# Patient Record
Sex: Male | Born: 1950 | Race: White | Hispanic: No | Smoking: Never smoker
Health system: Southern US, Community
[De-identification: ages and names within clinical notes are randomized; demographics above are authoritative.]

## PROBLEM LIST (undated history)

## (undated) DIAGNOSIS — I509 Heart failure, unspecified: Secondary | ICD-10-CM

## (undated) DIAGNOSIS — G5612 Other lesions of median nerve, left upper limb: Secondary | ICD-10-CM

## (undated) DIAGNOSIS — G8929 Other chronic pain: Secondary | ICD-10-CM

## (undated) DIAGNOSIS — N289 Disorder of kidney and ureter, unspecified: Secondary | ICD-10-CM

## (undated) DIAGNOSIS — K5792 Diverticulitis of intestine, part unspecified, without perforation or abscess without bleeding: Secondary | ICD-10-CM

## (undated) DIAGNOSIS — G5620 Lesion of ulnar nerve, unspecified upper limb: Secondary | ICD-10-CM

## (undated) DIAGNOSIS — M48062 Spinal stenosis, lumbar region with neurogenic claudication: Secondary | ICD-10-CM

## (undated) DIAGNOSIS — S329XXA Fracture of unspecified parts of lumbosacral spine and pelvis, initial encounter for closed fracture: Secondary | ICD-10-CM

## (undated) DIAGNOSIS — I10 Essential (primary) hypertension: Secondary | ICD-10-CM

## (undated) DIAGNOSIS — S82892A Other fracture of left lower leg, initial encounter for closed fracture: Secondary | ICD-10-CM

## (undated) DIAGNOSIS — K922 Gastrointestinal hemorrhage, unspecified: Secondary | ICD-10-CM

## (undated) DIAGNOSIS — N183 Chronic kidney disease, stage 3 unspecified: Secondary | ICD-10-CM

## (undated) DIAGNOSIS — M4804 Spinal stenosis, thoracic region: Secondary | ICD-10-CM

## (undated) DIAGNOSIS — U071 COVID-19: Secondary | ICD-10-CM

## (undated) DIAGNOSIS — M549 Dorsalgia, unspecified: Secondary | ICD-10-CM

## (undated) DIAGNOSIS — J329 Chronic sinusitis, unspecified: Secondary | ICD-10-CM

## (undated) DIAGNOSIS — G459 Transient cerebral ischemic attack, unspecified: Secondary | ICD-10-CM

## (undated) DIAGNOSIS — M1990 Unspecified osteoarthritis, unspecified site: Secondary | ICD-10-CM

## (undated) DIAGNOSIS — R9431 Abnormal electrocardiogram [ECG] [EKG]: Secondary | ICD-10-CM

## (undated) DIAGNOSIS — R911 Solitary pulmonary nodule: Secondary | ICD-10-CM

## (undated) DIAGNOSIS — Z9981 Dependence on supplemental oxygen: Secondary | ICD-10-CM

## (undated) DIAGNOSIS — F32A Depression, unspecified: Secondary | ICD-10-CM

## (undated) DIAGNOSIS — F329 Major depressive disorder, single episode, unspecified: Secondary | ICD-10-CM

## (undated) DIAGNOSIS — G5603 Carpal tunnel syndrome, bilateral upper limbs: Secondary | ICD-10-CM

## (undated) DIAGNOSIS — F4310 Post-traumatic stress disorder, unspecified: Secondary | ICD-10-CM

## (undated) DIAGNOSIS — G4733 Obstructive sleep apnea (adult) (pediatric): Secondary | ICD-10-CM

## (undated) HISTORY — PX: NASAL SEPTUM SURGERY: SHX37

## (undated) HISTORY — PX: ELBOW FRACTURE SURGERY: SHX616

---
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
## 2001-01-21 DIAGNOSIS — W19XXXA Unspecified fall, initial encounter: Secondary | ICD-10-CM

## 2001-01-21 HISTORY — DX: Unspecified fall, initial encounter: W19.XXXA

## 2003-02-07 ENCOUNTER — Emergency Department (HOSPITAL_COMMUNITY): Admission: EM | Admit: 2003-02-07 | Discharge: 2003-02-07 | Payer: Self-pay | Admitting: Emergency Medicine

## 2004-04-25 ENCOUNTER — Emergency Department (HOSPITAL_COMMUNITY): Admission: EM | Admit: 2004-04-25 | Discharge: 2004-04-25 | Payer: Self-pay | Admitting: Emergency Medicine

## 2005-09-11 ENCOUNTER — Emergency Department (HOSPITAL_COMMUNITY): Admission: EM | Admit: 2005-09-11 | Discharge: 2005-09-11 | Payer: Self-pay | Admitting: Emergency Medicine

## 2007-01-09 ENCOUNTER — Emergency Department (HOSPITAL_COMMUNITY): Admission: EM | Admit: 2007-01-09 | Discharge: 2007-01-09 | Payer: Self-pay | Admitting: Emergency Medicine

## 2012-01-22 DIAGNOSIS — G459 Transient cerebral ischemic attack, unspecified: Secondary | ICD-10-CM

## 2012-01-22 HISTORY — DX: Transient cerebral ischemic attack, unspecified: G45.9

## 2012-07-08 ENCOUNTER — Emergency Department (HOSPITAL_COMMUNITY): Payer: BC Managed Care – PPO

## 2012-07-08 ENCOUNTER — Observation Stay (HOSPITAL_COMMUNITY): Payer: BC Managed Care – PPO

## 2012-07-08 ENCOUNTER — Encounter (HOSPITAL_COMMUNITY): Payer: Self-pay | Admitting: Family Medicine

## 2012-07-08 ENCOUNTER — Observation Stay (HOSPITAL_COMMUNITY)
Admission: EM | Admit: 2012-07-08 | Discharge: 2012-07-11 | Disposition: A | Discharge status: Home / Self Care | Payer: BC Managed Care – PPO | Attending: Internal Medicine | Admitting: Internal Medicine

## 2012-07-08 DIAGNOSIS — R4182 Altered mental status, unspecified: Secondary | ICD-10-CM | POA: Insufficient documentation

## 2012-07-08 DIAGNOSIS — R209 Unspecified disturbances of skin sensation: Secondary | ICD-10-CM | POA: Insufficient documentation

## 2012-07-08 DIAGNOSIS — G459 Transient cerebral ischemic attack, unspecified: Principal | ICD-10-CM | POA: Diagnosis present

## 2012-07-08 DIAGNOSIS — K5732 Diverticulitis of large intestine without perforation or abscess without bleeding: Secondary | ICD-10-CM | POA: Insufficient documentation

## 2012-07-08 DIAGNOSIS — I10 Essential (primary) hypertension: Secondary | ICD-10-CM | POA: Diagnosis present

## 2012-07-08 DIAGNOSIS — R4789 Other speech disturbances: Secondary | ICD-10-CM | POA: Insufficient documentation

## 2012-07-08 DIAGNOSIS — R5383 Other fatigue: Secondary | ICD-10-CM | POA: Insufficient documentation

## 2012-07-08 DIAGNOSIS — K5792 Diverticulitis of intestine, part unspecified, without perforation or abscess without bleeding: Secondary | ICD-10-CM | POA: Diagnosis present

## 2012-07-08 DIAGNOSIS — R5381 Other malaise: Secondary | ICD-10-CM | POA: Insufficient documentation

## 2012-07-08 DIAGNOSIS — R1032 Left lower quadrant pain: Secondary | ICD-10-CM | POA: Insufficient documentation

## 2012-07-08 HISTORY — DX: Essential (primary) hypertension: I10

## 2012-07-08 LAB — BASIC METABOLIC PANEL
CO2: 25 mEq/L (ref 19–32)
Calcium: 9 mg/dL (ref 8.4–10.5)
GFR calc Af Amer: >90 mL/min (ref >90)
GFR calc non Af Amer: 89 mL/min — ABNORMAL LOW (ref >90)
Sodium: 136 mEq/L (ref 135–145)

## 2012-07-08 LAB — POCT I-STAT, CHEM 8
BUN: 26 mg/dL — ABNORMAL HIGH (ref 6–23)
Calcium, Ion: 1.06 mmol/L — ABNORMAL LOW (ref 1.13–1.30)
Chloride: 100 mEq/L (ref 96–112)
Creatinine, Ser: 1.1 mg/dL (ref 0.50–1.35)
Glucose, Bld: 97 mg/dL (ref 70–99)
Potassium: 3.2 mEq/L — ABNORMAL LOW (ref 3.5–5.1)
Sodium: 139 mEq/L (ref 135–145)

## 2012-07-08 LAB — CBC
MCH: 29 pg (ref 26.0–34.0)
MCHC: 33.9 g/dL (ref 30.0–36.0)
Platelets: 243 10*3/uL (ref 150–400)
RBC: 4.49 MIL/uL (ref 4.22–5.81)
RDW: 13.5 % (ref 11.5–15.5)

## 2012-07-08 LAB — GLUCOSE, CAPILLARY: Glucose-Capillary: 94 mg/dL (ref 70–99)

## 2012-07-08 MED ORDER — CIPROFLOXACIN HCL 500 MG PO TABS
500.0000 mg | ORAL_TABLET | Freq: Once | ORAL | Status: AC
  Administered 2012-07-08: 500 mg via ORAL
  Filled 2012-07-08: qty 1

## 2012-07-08 MED ORDER — ENOXAPARIN SODIUM 40 MG/0.4ML ~~LOC~~ SOLN
40.0000 mg | SUBCUTANEOUS | Status: DC
  Administered 2012-07-08 – 2012-07-10 (×3): 40 mg via SUBCUTANEOUS
  Filled 2012-07-08 (×4): qty 0.4

## 2012-07-08 MED ORDER — SODIUM CHLORIDE 0.9 % IV SOLN
INTRAVENOUS | Status: DC
  Administered 2012-07-08: 22:00:00 via INTRAVENOUS
  Administered 2012-07-09: 1000 mL via INTRAVENOUS

## 2012-07-08 MED ORDER — CIPROFLOXACIN IN D5W 400 MG/200ML IV SOLN
400.0000 mg | Freq: Two times a day (BID) | INTRAVENOUS | Status: DC
  Administered 2012-07-09 – 2012-07-10 (×3): 400 mg via INTRAVENOUS
  Filled 2012-07-08 (×4): qty 200

## 2012-07-08 MED ORDER — METRONIDAZOLE IN NACL 5-0.79 MG/ML-% IV SOLN
500.0000 mg | Freq: Three times a day (TID) | INTRAVENOUS | Status: DC
  Administered 2012-07-09 – 2012-07-10 (×5): 500 mg via INTRAVENOUS
  Filled 2012-07-08 (×7): qty 100

## 2012-07-08 MED ORDER — METRONIDAZOLE 500 MG PO TABS
500.0000 mg | ORAL_TABLET | Freq: Once | ORAL | Status: AC
  Administered 2012-07-08: 500 mg via ORAL
  Filled 2012-07-08: qty 1

## 2012-07-08 MED ORDER — IOHEXOL 300 MG/ML  SOLN
25.0000 mL | INTRAMUSCULAR | Status: AC
  Administered 2012-07-08: 25 mL via ORAL

## 2012-07-08 MED ORDER — ONDANSETRON HCL 4 MG/2ML IJ SOLN: 4.0000 mg | Freq: Four times a day (QID) | INTRAMUSCULAR | Status: DC | PRN

## 2012-07-08 MED ORDER — SERTRALINE HCL 100 MG PO TABS
100.0000 mg | ORAL_TABLET | Freq: Every day | ORAL | Status: DC
  Administered 2012-07-09 – 2012-07-11 (×3): 100 mg via ORAL
  Filled 2012-07-08 (×4): qty 1

## 2012-07-08 MED ORDER — ACETAMINOPHEN 325 MG PO TABS: 650.0000 mg | ORAL_TABLET | ORAL | Status: DC | PRN

## 2012-07-08 MED ORDER — ASPIRIN 325 MG PO TABS
325.0000 mg | ORAL_TABLET | Freq: Every day | ORAL | Status: DC
  Administered 2012-07-09 – 2012-07-11 (×3): 325 mg via ORAL
  Filled 2012-07-08 (×3): qty 1

## 2012-07-08 MED ORDER — IOHEXOL 300 MG/ML  SOLN
100.0000 mL | Freq: Once | INTRAMUSCULAR | Status: AC | PRN
  Administered 2012-07-08: 100 mL via INTRAVENOUS

## 2012-07-08 MED ORDER — NAPROXEN SODIUM 220 MG PO TABS: 220.0000 mg | ORAL_TABLET | Freq: Two times a day (BID) | ORAL | Status: DC

## 2012-07-08 MED ORDER — ASPIRIN 325 MG PO TABS
325.0000 mg | ORAL_TABLET | Freq: Once | ORAL | Status: AC
  Administered 2012-07-08: 325 mg via ORAL
  Filled 2012-07-08: qty 1

## 2012-07-08 MED ORDER — NAPROXEN 250 MG PO TABS
250.0000 mg | ORAL_TABLET | Freq: Two times a day (BID) | ORAL | Status: DC
  Administered 2012-07-09 – 2012-07-11 (×5): 250 mg via ORAL
  Filled 2012-07-08 (×7): qty 1

## 2012-07-08 NOTE — ED Provider Notes (Signed)
History     CSN: [id number]  Arrival date & time 07/08/12  1342   First MD Initiated Contact with Patient 07/08/12 1549      Chief Complaint  Patient presents with  . Weakness  . Loss of Consciousness  . Arm Pain     HPI Patient reports developing left lower quadrant abdominal pain over the past 24 hours without nausea or vomiting.  He states he has a prolonged history of diverticulitis over the past 15-20 years this feels like his prior episodes of diverticulitis.  He states he went to work today was having a little bit more low back pain and he normally does when his reported that he developed slurred speech and altered mental status.  He was working with a friend at that time it sounds as though the patient went to his primary care physician Dr. Jeannetta Nap.  Dr. Jeannetta Nap and the patient emergency department and told his spouse is concerned about a stroke.  The spouse reports that his speech was slurred when she initially saw him but reports his speech is normalized at this point.  Patient denies use of anticoagulants.  No prior history of stroke.  No chest pain or shortness of breath.  He reports left lower quadrant pain which is mild to moderate in severity.  His pain is worsened by movement and palpation of his left lower quadrant.  He denies weakness of his upper lower extremities.  Sounds as though there may have been a syncopal episode that occurred at the patient's work at which point he struck his head.  He is having left-sided headache at this time.  His mother died of a cerebral aneurysm in her 29s.  He reports awakening with a left-sided headache today which is atypical for him.   Past Medical History  Diagnosis Date  . Hypertension     History reviewed. No pertinent past surgical history.  History reviewed. No pertinent family history.  History  Substance Use Topics  . Smoking status: Never Smoker   . Smokeless tobacco: Not on file  . Alcohol Use: Yes      Review of  Systems  All other systems reviewed and are negative.    Allergies  Review of patient's allergies indicates no known allergies.  Home Medications   Current Outpatient Rx  Name  Route  Sig  Dispense  Refill  . hydrochlorothiazide (HYDRODIURIL) 25 MG tablet   Oral   Take 25 mg by mouth daily.         Marland Kitchen ibuprofen (ADVIL,MOTRIN) 200 MG tablet   Oral   Take 400 mg by mouth 2 (two) times daily.         . naproxen sodium (ANAPROX) 220 MG tablet   Oral   Take 220 mg by mouth 2 (two) times daily with a meal.         . sertraline (ZOLOFT) 100 MG tablet   Oral   Take 100 mg by mouth daily.           BP 136/73  Pulse 72  Temp(Src) 98.1 F (36.7 C) (Oral)  Resp 10  SpO2 93%  Physical Exam  Nursing note and vitals reviewed. Constitutional: He is oriented to person, place, and time. He appears well-developed and well-nourished.  HENT:  Head: Normocephalic and atraumatic.  Eyes: EOM are normal. Pupils are equal, round, and reactive to light.  Neck: Normal range of motion.  Cardiovascular: Normal rate, regular rhythm, normal heart sounds and intact distal pulses.  Pulmonary/Chest: Effort normal and breath sounds normal. No respiratory distress.  Abdominal: Soft. He exhibits no distension.  Left lower quadrant tenderness without guarding or rebound.  No peritonitis.  Genitourinary: Rectum normal.  Musculoskeletal: Normal range of motion.  Neurological: He is alert and oriented to person, place, and time.  5/5 strength in major muscle groups of  bilateral upper and lower extremities. Speech normal. No facial asymetry.  Gait not tested   Skin: Skin is warm and dry.  Psychiatric: He has a normal mood and affect. Judgment normal.    ED Course  Procedures (including critical care time)  Labs Reviewed  CBC - Abnormal; Notable for the following:    HCT 38.4 (*)    All other components within normal limits  BASIC METABOLIC PANEL - Abnormal; Notable for the following:     BUN 25 (*)    GFR calc non Af Amer 89 (*)    All other components within normal limits  POCT I-STAT, CHEM 8 - Abnormal; Notable for the following:    Potassium 3.2 (*)    BUN 26 (*)    Calcium, Ion 1.06 (*)    All other components within normal limits  GLUCOSE, CAPILLARY  TROPONIN I    Date: 07/08/2012  Rate: 93  Rhythm: normal sinus rhythm  QRS Axis: normal  Intervals: normal  ST/T Wave abnormalities: normal  Conduction Disutrbances: none  Narrative Interpretation:   Old EKG Reviewed: No significant changes noted     Ct Head Wo Contrast  07/08/2012   *RADIOLOGY REPORT*  Clinical Data: Weakness, loss of consciousness.  Left-sided head pain.  CT HEAD WITHOUT CONTRAST  Technique:  Contiguous axial images were obtained from the base of the skull through the vertex without contrast.  Comparison: None  Findings: No acute intracranial abnormality.  Specifically, no hemorrhage, hydrocephalus, mass lesion, acute infarction, or significant intracranial injury.  No acute calvarial abnormality.  Orbital soft tissues are unremarkable.  Mastoids are clear.  IMPRESSION: No intracranial abnormality.   Original Report Authenticated By: Charlett Nose, M.D.   Ct Abdomen Pelvis W Contrast  07/08/2012   *RADIOLOGY REPORT*  Clinical Data: Weakness.  Left lower quadrant pain.  CT ABDOMEN AND PELVIS WITH CONTRAST  Technique:  Multidetector CT imaging of the abdomen and pelvis was performed following the standard protocol during bolus administration of intravenous contrast.  Contrast: OMNIPAQUE IOHEXOL 300 MG/ML  SOLN  Comparison: None.  Findings: Lung bases are clear.  No effusions.  Heart is normal size.  Liver, spleen, pancreas, adrenals, left kidney are unremarkable. Small nonobstructing stones in the right kidney.  No ureteral stones.  No hydronephrosis.  Appendix is visualized and is normal.  Stomach grossly unremarkable.  Small bowel is decompressed.  Scattered distal descending colonic and sigmoid  diverticulosis. There is subtle stranding around the distal descending colon, best seen on coronal image 50 compatible with early acute diverticulitis changes.  No free air.  No focal fluid collection.  Urinary bladder is unremarkable.  Aorta is normal caliber.  Degenerative disc and facet disease throughout the thoracic and lumbar spine.  No acute bony abnormality.  IMPRESSION: Descending colonic and sigmoid diverticulosis.  Subtle stranding around the distal descending colon compatible with early acute diverticulitis.  Right nephrolithiasis.   Original Report Authenticated By: Charlett Nose, M.D.   I personally reviewed the imaging tests through PACS system I reviewed available ER/hospitalization records through the EMR   1. TIA (transient ischemic attack)   2. Diverticulitis  Medications  iohexol (OMNIPAQUE) 300 MG/ML solution 25 mL (25 mLs Oral Contrast Given 07/08/12 1653)  ciprofloxacin (CIPRO) tablet 500 mg (not administered)  metroNIDAZOLE (FLAGYL) tablet 500 mg (not administered)  iohexol (OMNIPAQUE) 300 MG/ML solution 100 mL (100 mLs Intravenous Contrast Given 07/08/12 1746)      MDM  Left lower quadrant pain sounds concerning for diverticulitis.  Pain will be treated.  CT scan at this time.  It's unclear to me what the etiology of his altered mental status was today but you have to consider stroke/TIA as the cause.  His symptoms seemed to resolve at this time.  He is not a candidate for TPA based on rapidly resolving symptoms and NIH stroke scale is 0 at this time        Lyanne Co, MD 07/08/12 [phone]

## 2012-07-08 NOTE — Progress Notes (Signed)
Unit CM UR Completed by [facility] ED CM  W. Reise Hietala RN  

## 2012-07-08 NOTE — H&P (Signed)
History and Physical       Hospital Admission Note Date: 07/08/2012  Patient name: Kurt [name] Medical record number: [id number] Date of birth: Jun 18, 1950 Age: 62 y.o. Gender: male PCP: Kaleen Mask, MD    Chief Complaint:  Headache with left-sided facial numbness, slurred speech and confusion today Abdominal pain x 24 hours  HPI: Patient is a 61 year old male with history of hypertension and recurrent diverticulitis presented to the ED with above symptoms. History was obtained from the patient who stated that he started having left lower quadrant abdominal pain yesterday without any nausea or vomiting which is typical of his diverticulitis. Patient had some leftover ciprofloxacin and Flagyl at home and started it yesterday evening. This morning he went to work and at around 10:30am, patient started having headache radiating to the left jaw and left sided facial numbness. He noticed that he had trouble walking and was staggering with his gait. Patient as one of his coworkers to take him to his PCP. 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 Patient's PCP, Dr. Jeannetta Nap referred the patient to emergency department for the concern of acute stroke. Patient's wife also noticed that patient was somewhat confused and had slurred speech around 12pm when she saw him. Patient denies any prior history of stroke, he's not on any aspirin. At the time of my encounter, patient reports that his symptoms have almost resolved except for slight facial numbness on the left.  Review of Systems:  Constitutional: Denies fever, chills, diaphoresis, poor appetite and fatigue.  HEENT: Denies photophobia, eye pain, redness, hearing loss, ear pain, congestion, sore throat, rhinorrhea, sneezing, mouth sores, trouble swallowing, neck pain, neck stiffness and tinnitus.   Respiratory: Denies SOB, DOE, cough, chest  tightness,  and wheezing.   Cardiovascular: Denies chest pain, palpitations and leg swelling.  Gastrointestinal: Please see history of present illness Genitourinary: Denies dysuria, urgency, frequency, hematuria, flank pain and difficulty urinating.  Musculoskeletal: Denies myalgias, joint swelling, arthralgias and gait problem.  he chronically has shortening and back pain from arthritis Skin: Denies pallor, rash and wound.  Neurological: Please see history of present illness  Hematological: Denies adenopathy. Easy bruising, personal or family bleeding history  Psychiatric/Behavioral: Denies suicidal ideation, mood changes, confusion, nervousness, sleep disturbance and agitation  Past Medical History: Past Medical History  Diagnosis Date  . Hypertension    History reviewed. No pertinent past surgical history.  Medications: Prior to Admission medications   Medication Sig Start Date End Date Taking? Authorizing Provider  hydrochlorothiazide (HYDRODIURIL) 25 MG tablet Take 25 mg by mouth daily.   Yes Historical Provider, MD  ibuprofen (ADVIL,MOTRIN) 200 MG tablet Take 400 mg by mouth 2 (two) times daily.   Yes Historical Provider, MD  naproxen sodium (ANAPROX) 220 MG tablet Take 220 mg by mouth 2 (two) times daily with a meal.   Yes Historical Provider, MD  sertraline (ZOLOFT) 100 MG tablet Take 100 mg by mouth daily.   Yes Historical Provider, MD    Allergies:  No Known Allergies  Social History:  reports that he has never smoked. He does not have any smokeless tobacco history on file. He reports that  drinks alcohol. His drug history is not on file.  Family History: History reviewed. No pertinent family history.  Physical Exam: Blood pressure 136/73, pulse 72, temperature 98.1 F (36.7 C), temperature source Oral, resp. rate 10, SpO2 93.00%. General: Alert, awake, oriented x3, in no acute distress. HEENT: normocephalic,  atraumatic, anicteric sclera, pink conjunctiva, pupils  equal and reactive to light and accomodation, oropharynx clear Neck: supple, no masses or lymphadenopathy, no goiter, no bruits  Heart: Regular rate and rhythm, without murmurs, rubs or gallops. Lungs: Clear to auscultation bilaterally, no wheezing, rales or rhonchi. Abdomen: Soft, right and left lower quadrant tenderness to deep palpation, no rebound or guarding, nondistended, positive bowel sounds, no masses. Extremities: No clubbing, cyanosis or edema with positive pedal pulses. Neuro: Grossly intact, no focal neurological deficits, strength 5/5 upper and lower extremities bilaterally, no dysarthria Psych: alert and oriented x 3, normal mood and affect Skin: no rashes or lesions, warm and dry   LABS on Admission:  Basic Metabolic Panel:  Recent Labs Lab 07/08/12 1400 07/08/12 1416  NA 136 139  K 3.6 3.2*  CL 99 100  CO2 25  --   GLUCOSE 93 97  BUN 25* 26*  CREATININE 0.90 1.10  CALCIUM 9.0  --    Liver Function Tests: No results found for this basename: AST, ALT, ALKPHOS, BILITOT, PROT, ALBUMIN,  in the last 168 hours No results found for this basename: LIPASE, AMYLASE,  in the last 168 hours No results found for this basename: AMMONIA,  in the last 168 hours CBC:  Recent Labs Lab 07/08/12 1400 07/08/12 1416  WBC 6.8  --   HGB 13.0 13.9  HCT 38.4* 41.0  MCV 85.5  --   PLT 243  --    Cardiac Enzymes:  Recent Labs Lab 07/08/12 1624  TROPONINI <0.30   BNP: No components found with this basename: POCBNP,  CBG:  Recent Labs Lab 07/08/12 1412  GLUCAP 94     Radiological Exams on Admission: Ct Head Wo Contrast  07/08/2012   *RADIOLOGY REPORT*  Clinical Data: Weakness, loss of consciousness.  Left-sided head pain.  CT HEAD WITHOUT CONTRAST  Technique:  Contiguous axial images were obtained from the base of the skull through the vertex without contrast.  Comparison: None  Findings: No acute intracranial abnormality.  Specifically, no hemorrhage, hydrocephalus,  mass lesion, acute infarction, or significant intracranial injury.  No acute calvarial abnormality.  Orbital soft tissues are unremarkable.  Mastoids are clear.  IMPRESSION: No intracranial abnormality.   Original Report Authenticated By: Charlett Nose, M.D.   Ct Abdomen Pelvis W Contrast  07/08/2012   *RADIOLOGY REPORT*  Clinical Data: Weakness.  Left lower quadrant pain.  CT ABDOMEN AND PELVIS WITH CONTRAST  Technique:  Multidetector CT imaging of the abdomen and pelvis was performed following the standard protocol during bolus administration of intravenous contrast.  Contrast: OMNIPAQUE IOHEXOL 300 MG/ML  SOLN  Comparison: None.  Findings: Lung bases are clear.  No effusions.  Heart is normal size.  Liver, spleen, pancreas, adrenals, left kidney are unremarkable. Small nonobstructing stones in the right kidney.  No ureteral stones.  No hydronephrosis.  Appendix is visualized and is normal.  Stomach grossly unremarkable.  Small bowel is decompressed.  Scattered distal descending colonic and sigmoid diverticulosis. There is subtle stranding around the distal descending colon, best seen on coronal image 50 compatible with early acute diverticulitis changes.  No free air.  No focal fluid collection.  Urinary bladder is unremarkable.  Aorta is normal caliber.  Degenerative disc and facet disease throughout the thoracic and lumbar spine.  No acute bony abnormality.  IMPRESSION: Descending colonic and sigmoid diverticulosis.  Subtle stranding around the distal descending colon compatible with early acute diverticulitis.  Right nephrolithiasis.   Original Report Authenticated By:  Charlett Nose, M.D.    Assessment/Plan Principal Problem:   TIA (transient ischemic attack): out of TPA window due to delayed presentation and symptoms almost resolved -  Admit for TIA workup, will obtain MRI brain, MRA, 2-D echo, carotid Dopplers, gentle hydration - PT, OT, evaluation  - Place on aspirin, obtain lipid panel -  Neurology will be consulted if MRI positive for CVA  Active Problems:   Diverticulitis: - Placed on IV ciprofloxacin and Flagyl antiemetics, gentle hydration ,    Hypertension - Currently BP stable, hold HCTZ, place on gentle hydration   DVT prophylaxis:  Lovenox   CODE STATUS:  Full CODE STATUS   Further plan will depend as patient's clinical course evolves and further radiologic and laboratory data become available.   Time Spent on Admission: 60 mins  RAI,RIPUDEEP M.D. [facility] 07/08/2012, 7:06 PM Pager: [phone]  If 7PM-7AM, please contact night-coverage [email or web address] Password [email or web address]

## 2012-07-08 NOTE — ED Notes (Signed)
Report given to 4 north rn.  Pt transported via stretcher to floor

## 2012-07-08 NOTE — ED Notes (Signed)
Per pt sts he has had ear pain and sinus problems since last week. sts this morning woke up with left sided head pain and went to work and was sitting at desk and slumped over. sts memory issues since then also. sts also diverticulitis flared up.

## 2012-07-09 DIAGNOSIS — G459 Transient cerebral ischemic attack, unspecified: Secondary | ICD-10-CM

## 2012-07-09 LAB — LIPID PANEL: Cholesterol: 148 mg/dL (ref 0–200)

## 2012-07-09 LAB — RAPID URINE DRUG SCREEN, HOSP PERFORMED
Amphetamines: NOT DETECTED
Cocaine: NOT DETECTED
Opiates: NOT DETECTED
Tetrahydrocannabinol: NOT DETECTED

## 2012-07-09 LAB — URINALYSIS, ROUTINE W REFLEX MICROSCOPIC
Glucose, UA: NEGATIVE mg/dL
Hgb urine dipstick: NEGATIVE
Specific Gravity, Urine: 1.029 (ref 1.005–1.030)
pH: 6.5 (ref 5.0–8.0)
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
## 2012-07-09 NOTE — Progress Notes (Signed)
Echo Lab  2D Echocardiogram completed.  Kurt [name] L Aayan Haskew, RDCS 07/09/2012 10:22 AM

## 2012-07-09 NOTE — Progress Notes (Signed)
*  PRELIMINARY RESULTS* Vascular Ultrasound Carotid Duplex (Doppler) has been completed.  Preliminary findings: Bilateral:  Less than 39% ICA stenosis.  Vertebral artery flow is antegrade.      Farrel Demark, RDMS, RVT  07/09/2012, 10:31 AM

## 2012-07-09 NOTE — Progress Notes (Signed)
[facility] PROGRESS NOTE  Kurt [name] TFT:[id number] DOB: December 07, 1950 DOA: 07/08/2012 PCP: Kaleen Mask, MD  Assessment/Plan: 1. TIA; Will obtain MRI/MRA of brain, carotid Dopplers, TTE. Start patient on aspirin 2. Diverticulitis; she started on ciprofloxacin plus metronidazole for 2 weeks. Will discuss with family possibility of a need for GI consult depending on last attack. 3. HTN; controlled 4.  DVT prophylaxis; patient started on Lovenox.  TIA (transient ischemic attack) Active Problems:   Diverticulitis   Hypertension  Code Status: Full Family Communication: Discussed treatment plan with patient and wife   Disposition Plan: Patient's TIA workup is negative consider discharge   Consultants: Neurologic, PT, OT Procedures: None Antibiotics:   metronidazole plus ciprofloxacin started on 19 June.  HPI/Subjective: 62 yo WMPMHx HTN, Recurrent diverticulitis. Presented to ED c/o Lt lower quadrant abdominal pain yesterday without any nausea or vomiting which is typical of his diverticulitis. Patient had some leftover ciprofloxacin and Flagyl at home and started it yesterday evening. This morning he went to work and at around 10:30am, patient started having headache radiating to Lt jaw and left sided facial numbness. Noticed trouble ambulating and was staggering with his gait. One of his coworkers took Pt to PCP. 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 Patient's PCP, Dr. Jeannetta Nap referred the patient to ED for the concern of acute stroke. Patient's wife also noticed that patient was somewhat confused and had slurred speech around 12pm when she saw him. Patient denies any prior history of stroke, he's not on any aspirin. TODAY patient states all symptoms have resolved.  Objective: Filed Vitals:   07/09/12 0630 07/09/12 1127 07/09/12 1358 07/09/12 1805  BP: 136/84 145/78 126/66 124/66  Pulse: 64 72 74 84  Temp: 98 F  (36.7 C) 98.2 F (36.8 C) 99.2 F (37.3 C) 99 F (37.2 C)  TempSrc: Oral Oral Oral Oral  Resp: 20 18 18 20   Height:      Weight:      SpO2: 97% 98% 96% 97%    Intake/Output Summary (Last 24 hours) at 07/09/12 2028 Last data filed at 07/09/12 7062  Gross per 24 hour  Intake    480 ml  Output   1325 ml  Net   -845 ml   Filed Weights   07/08/12 2034  Weight: 112.492 kg (248 lb)    Exam:   General: Alert and oriented x4  Cardiovascular: Regular rhythm and rate, negative murmurs rubs or gallops  Respiratory: Clear to auscultation bilaterally  Abdomen: Soft nontender nondistended plus bowel sounds  Neurologic: Pupils equal round reactive to light and accommodation, renal nerves II through XII intact, upper extremity sensation equal, upper extremity 2. discrimination within normal limits, lower extremity sensation decreased bilaterally (per patient baseline after fall and multiple surgeries),, negative Romberg, mild positive pronator drift right arm.  Data Reviewed: Basic Metabolic Panel:  Recent Labs Lab 07/08/12 1400 07/08/12 1416  NA 136 139  K 3.6 3.2*  CL 99 100  CO2 25  --   GLUCOSE 93 97  BUN 25* 26*  CREATININE 0.90 1.10  CALCIUM 9.0  --    Liver Function Tests: No results found for this basename: AST, ALT, ALKPHOS, BILITOT, PROT, ALBUMIN,  in the last 168 hours No results found for this basename: LIPASE, AMYLASE,  in the last 168 hours No results found for this basename: AMMONIA,  in the last 168 hours CBC:  Recent Labs Lab 07/08/12 1400 07/08/12 1416  WBC 6.8  --   HGB 13.0 13.9  HCT 38.4* 41.0  MCV 85.5  --   PLT 243  --    Cardiac Enzymes:  Recent Labs Lab 07/08/12 1624  TROPONINI <0.30   BNP (last 3 results) No results found for this basename: PROBNP,  in the last 8760 hours CBG:  Recent Labs Lab 07/08/12 1412  GLUCAP 94    No results found for this or any previous visit (from the past 240 hour(s)).   Studies: Ct Head Wo  Contrast  07/08/2012   *RADIOLOGY REPORT*  Clinical Data: Weakness, loss of consciousness.  Left-sided head pain.  CT HEAD WITHOUT CONTRAST  Technique:  Contiguous axial images were obtained from the base of the skull through the vertex without contrast.  Comparison: None  Findings: No acute intracranial abnormality.  Specifically, no hemorrhage, hydrocephalus, mass lesion, acute infarction, or significant intracranial injury.  No acute calvarial abnormality.  Orbital soft tissues are unremarkable.  Mastoids are clear.  IMPRESSION: No intracranial abnormality.   Original Report Authenticated By: Charlett Nose, M.D.   Mri Brain Without Contrast  07/09/2012   *RADIOLOGY REPORT*  Clinical Data:  Headache with left-sided facial numbness and slurred speech with unsteady gait.  Confusion.  Hypertension.  MRI BRAIN WITHOUT CONTRAST MRA HEAD WITHOUT CONTRAST  Technique: Multiplanar, multiecho pulse sequences of the brain and surrounding structures were obtained according to standard protocol without intravenous contrast.  Angiographic images of the head were obtained using MRA technique without contrast.  Comparison: 07/08/2012 head CT.  No comparison brain MR.  MRI HEAD  Findings:  No acute infarct.  No intracranial hemorrhage.  Minimal small vessel disease type changes.  No hydrocephalus.  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  No intracranial mass lesion detected on this unenhanced exam.  Left maxillary sinus tiny air fluid level. Sphenoid sinus air cell mucosal thickening/partial opacification greater on the right. Minimal mucosal thickening right maxillary sinus.  Partial opacification right mastoid air cells.  No obvious mass at the level of the eustachian tube drainage.  Slight decreased signal intensity bone marrow may be related to the patient's habitus or anemia.  Cervical medullary junction, pituitary region, pineal region and orbital structures unremarkable.  IMPRESSION: No acute  infarct.  Please see above.  MRA HEAD  Findings: Ectatic left internal carotid artery supraclinoid segment.  Fetal type origin of the posterior cerebral arteries bilaterally.  Slightly bulbous appearance anterior communicating artery region without discrete aneurysm noted.  Right vertebral artery is dominant.  Mild narrowing of portions of the right vertebral artery.  Poor delineation of the right PICA.  Irregular narrowed left vertebral artery which becomes significantly smaller after takeoff of the left PICA.  Left PICA there is poorly delineated.  Artifact in this region limits evaluation.  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  Bulbous appearance of the basilar tip.  This may be related to the fetal type origin of the posterior cerebral arteries although 2.4 mm aneurysm not excluded.  Posterior cerebral artery branch vessel narrowing irregularity more notable on the left.  IMPRESSION:  Atherosclerotic type changes most notable involving the posterior circulation.  Please see above.  Bulbous appearance of the basilar tip.  This may be related to the fetal type origin of the posterior cerebral arteries although 2.4 mm aneurysm not excluded.   Original Report Authenticated By: Viviann Spare  Constance Goltz, M.D.   Ct Abdomen Pelvis W Contrast  07/08/2012   *RADIOLOGY REPORT*  Clinical Data: Weakness.  Left lower quadrant pain.  CT ABDOMEN AND PELVIS WITH CONTRAST  Technique:  Multidetector CT imaging of the abdomen and pelvis was performed following the standard protocol during bolus administration of intravenous contrast.  Contrast: OMNIPAQUE IOHEXOL 300 MG/ML  SOLN  Comparison: None.  Findings: Lung bases are clear.  No effusions.  Heart is normal size.  Liver, spleen, pancreas, adrenals, left kidney are unremarkable. Small nonobstructing stones in the  right kidney.  No ureteral stones.  No hydronephrosis.  Appendix is visualized and is normal.  Stomach grossly unremarkable.  Small bowel is decompressed.  Scattered distal descending colonic and sigmoid diverticulosis. There is subtle stranding around the distal descending colon, best seen on coronal image 50 compatible with early acute diverticulitis changes.  No free air.  No focal fluid collection.  Urinary bladder is unremarkable.  Aorta is normal caliber.  Degenerative disc and facet disease throughout the thoracic and lumbar spine.  No acute bony abnormality.  IMPRESSION: Descending colonic and sigmoid diverticulosis.  Subtle stranding around the distal descending colon compatible with early acute diverticulitis.  Right nephrolithiasis.   Original Report Authenticated By: Charlett Nose, M.D.   Mr Mra Head/brain Wo Cm  07/09/2012   *RADIOLOGY REPORT*  Clinical Data:  Headache with left-sided facial numbness and slurred speech with unsteady gait.  Confusion.  Hypertension.  MRI BRAIN WITHOUT CONTRAST MRA HEAD WITHOUT CONTRAST  Technique: Multiplanar, multiecho pulse sequences of the brain and surrounding structures were obtained according to standard protocol without intravenous contrast.  Angiographic images of the head were obtained using MRA technique without contrast.  Comparison: 07/08/2012 head CT.  No comparison brain MR.  MRI HEAD  Findings:  No acute infarct.  No intracranial hemorrhage.  Minimal small vessel disease type changes.  No hydrocephalus.  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  No intracranial mass lesion detected on this unenhanced exam.  Left maxillary sinus tiny air fluid level. Sphenoid sinus air cell mucosal thickening/partial opacification greater on the right. Minimal mucosal thickening right maxillary sinus.  Partial opacification right mastoid air cells.  No obvious mass at the level of the eustachian tube drainage.  Slight decreased signal intensity bone  marrow may be related to the patient's habitus or anemia.  Cervical medullary junction, pituitary region, pineal region and orbital structures unremarkable.  IMPRESSION: No acute infarct.  Please see above.  MRA HEAD  Findings: Ectatic left internal carotid artery supraclinoid segment.  Fetal type origin of the posterior cerebral arteries bilaterally.  Slightly bulbous appearance anterior communicating artery region without discrete aneurysm noted.  Right vertebral artery is dominant.  Mild narrowing of portions of the right vertebral artery.  Poor delineation of the right PICA.  Irregular narrowed left vertebral artery which becomes significantly smaller after takeoff of the left PICA.  Left PICA there is poorly delineated.  Artifact in this region limits evaluation.  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  Bulbous appearance of the basilar tip.  This may be related to the fetal type origin of the posterior cerebral arteries although 2.4 mm aneurysm not excluded.  Posterior cerebral artery branch vessel narrowing irregularity more notable on the left.  IMPRESSION:  Atherosclerotic type changes most notable involving the posterior circulation.  Please see above.  Bulbous appearance of the basilar tip.  This may be related to the fetal type origin of the posterior cerebral arteries although 2.4 mm aneurysm not excluded.   Original Report Authenticated By: Lacy Duverney, M.D.    Scheduled Meds: . aspirin  325 mg Oral Daily  . ciprofloxacin  400 mg Intravenous Q12H  . enoxaparin (LOVENOX) injection  40 mg Subcutaneous Q24H  . metronidazole  500 mg Intravenous Q8H  . naproxen  250 mg Oral BID WC  . sertraline  100 mg Oral Daily   Continuous Infusions: . sodium chloride 1,000 mL (07/09/12 1435)    Principal Problem:   TIA (transient ischemic  attack) Active Problems:   Diverticulitis   Hypertension    Time spent: 60 minutes    Gram Siedlecki, J  [facility] Pager [phone]. If 7PM-7AM, please contact night-coverage at [email or web address], password [facility] 07/09/2012, 8:28 PM  LOS: 1 day

## 2012-07-09 NOTE — Evaluation (Signed)
Occupational Therapy Evaluation Patient Details Name: Kurt [name] MRN: [id number] DOB: [birth year] Today's Date: 07/09/2012 Time: 4098-1191 OT Time Calculation (min): 24 min  OT Assessment / Plan / Recommendation Clinical Impression  Pt is 62 y/o male admitted w/ TIA,whom  is overall Mod I - supervision level for LB ADL's, functional mobility and transfers. Pt reports that his symptoms have resolved & states he is at his baseline level & states no needs from OT. Will sign off at this time.    OT Assessment  Patient does not need any further OT services    Follow Up Recommendations  No OT follow up    Barriers to Discharge      Equipment Recommendations  None recommended by OT    Recommendations for Other Services    Frequency       Precautions / Restrictions Restrictions Weight Bearing Restrictions: No   Pertinent Vitals/Pain Pt reports h/o constant back pain from old injury.    ADL  Eating/Feeding: Performed;Independent Where Assessed - Eating/Feeding: Edge of bed Grooming: Simulated;Modified independent Where Assessed - Grooming: Supported sitting Upper Body Bathing: Simulated;Modified independent;Set up Where Assessed - Upper Body Bathing: Unsupported sitting Lower Body Bathing: Simulated;Modified independent Where Assessed - Lower Body Bathing: Unsupported sit to stand Upper Body Dressing: Performed;Modified independent Where Assessed - Upper Body Dressing: Unsupported sit to stand Lower Body Dressing: Performed;Modified independent Where Assessed - Lower Body Dressing: Unsupported sit to stand Toilet Transfer: Simulated;Modified independent Toilet Transfer Method: Sit to Barista: Comfort height toilet Toileting - Clothing Manipulation and Hygiene: Simulated;Modified independent Where Assessed - Toileting Clothing Manipulation and Hygiene: Standing Tub/Shower Transfer: Performed;Modified independent;Supervision/safety Tub/Shower Transfer  Method: Ambulating Transfers/Ambulation Related to ADLs: Pt has long h/o back pain from a multi story fall in 2002 & reports that he has had PT in past for balance issues. He ambulates w/o a/d, however has a cane, manual w/c, RW. Recommend supervision assist step into/out of tub & pt verbalized understanding of this. ADL Comments: Pt is overall Mod I - supervision level for LB ADL's, functional mobility and transfers. Pt reports that he is at his baseline level & states no needs from OT. Will sign off at this time.    OT Diagnosis:    OT Problem List:   OT Treatment Interventions:     OT Goals    Visit Information  Last OT Received On: 07/09/12 Assistance Needed: +1    Subjective Data  Subjective: "I've been up to the bathroom" Patient Stated Goal: Home   Prior Functioning     Home Living Available Help at Discharge: Family Type of Home: House Home Access: Stairs to enter Secretary/administrator of Steps: 9 Entrance Stairs-Rails: Left;Right Home Layout: One level Bathroom Shower/Tub: Engineer, manufacturing systems: Standard Home Adaptive Equipment: Walker - rolling;Straight cane;Wheelchair - manual;Shower chair with back;Grab bars in shower;Grab bars around toilet Prior Function Level of Independence: Independent Able to Take Stairs?: Yes Driving: Yes 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 Communication: No difficulties Dominant Hand: Right    Vision/Perception Vision - History Patient Visual Report: No change from baseline   Cognition  Cognition Arousal/Alertness: Awake/alert Behavior During Therapy: WFL for tasks assessed/performed Overall Cognitive Status: Within Functional Limits for tasks assessed    Extremity/Trunk Assessment Right Upper Extremity Assessment RUE ROM/Strength/Tone: [facility] for tasks assessed Left Upper Extremity Assessment LUE ROM/Strength/Tone: WFL for tasks assessed Right Lower Extremity  Assessment RLE ROM/Strength/Tone: WFL for tasks assessed RLE Sensation: WFL -  Light Touch Left Lower Extremity Assessment LLE ROM/Strength/Tone: WFL for tasks assessed LLE Sensation: WFL - Light Touch    Mobility Bed Mobility Bed Mobility: Supine to Sit Supine to Sit: 7: Independent Transfers Sit to Stand: 6: Modified independent (Device/Increase time);With upper extremity assist;From bed Stand to Sit: With upper extremity assist;To bed;To chair/3-in-1        Balance Balance Balance Assessed: Yes Static Standing Balance Static Standing - Balance Support: No upper extremity supported Static Standing - Level of Assistance: 7: Independent Static Standing - Comment/# of Minutes: independent static stance with normal BOS, able to assume romberg with eyes open with no difficulty other than complaining of back pain, with eyes closed he has rather large postural sway needing mingaurdA (no LOB); able to attempt SLS bilaterally without LOB however pt unable to hold longer than 1-2 seconds  Single Leg Stance - Right Leg: 2 Single Leg Stance - Left Leg: 2 Rhomberg - Eyes Opened: 30 Rhomberg - Eyes Closed: 30 (needs mingaurdA because of increased postural sway)   End of Session OT - End of Session Activity Tolerance: Patient tolerated treatment well Patient left: in chair;with call bell/phone within reach;with family/visitor present  GO Functional Assessment Tool Used: Clinical judgement Functional Limitation: Self care Self Care Current Status (Z6109): At least 1 percent but less than 20 percent impaired, limited or restricted Self Care Goal Status (U0454): At least 1 percent but less than 20 percent impaired, limited or restricted Self Care Discharge Status [phone]): At least 1 percent but less than 20 percent impaired, limited or restricted   Cheskel, Silverio 07/09/2012, 12:32 PM

## 2012-07-10 ENCOUNTER — Encounter (HOSPITAL_COMMUNITY): Payer: Self-pay | Admitting: Radiology

## 2012-07-10 ENCOUNTER — Observation Stay (HOSPITAL_COMMUNITY): Payer: BC Managed Care – PPO

## 2012-07-10 LAB — GLUCOSE, CAPILLARY

## 2012-07-10 MED ORDER — METRONIDAZOLE 500 MG PO TABS
500.0000 mg | ORAL_TABLET | Freq: Three times a day (TID) | ORAL | Status: DC
  Administered 2012-07-10 – 2012-07-11 (×2): 500 mg via ORAL
  Filled 2012-07-10 (×5): qty 1

## 2012-07-10 MED ORDER — TRAMADOL HCL 50 MG PO TABS
50.0000 mg | ORAL_TABLET | Freq: Four times a day (QID) | ORAL | Status: DC | PRN
  Administered 2012-07-10 (×2): 50 mg via ORAL
  Filled 2012-07-10 (×2): qty 1

## 2012-07-10 MED ORDER — IOHEXOL 350 MG/ML SOLN
100.0000 mL | Freq: Once | INTRAVENOUS | Status: AC | PRN
  Administered 2012-07-10: 50 mL via INTRAVENOUS

## 2012-07-10 MED ORDER — CIPROFLOXACIN HCL 500 MG PO TABS
500.0000 mg | ORAL_TABLET | Freq: Two times a day (BID) | ORAL | Status: DC
  Administered 2012-07-10 – 2012-07-11 (×2): 500 mg via ORAL
  Filled 2012-07-10 (×3): qty 1

## 2012-07-10 NOTE — Progress Notes (Signed)
[facility] PROGRESS NOTE  Kurt [name] WGN:[id number] DOB: [birth year] DOA: 07/08/2012 PCP: Kaleen Mask, MD  Assessment/Plan: 1. TIA; Will obtain MRI/MRA of brain, carotid Dopplers, TTE. Start patient on aspirin 2. Diverticulitis; she started on ciprofloxacin plus metronidazole for 2 weeks. NOTE; last episode of diverticulitis requiring antibiotics was 2012 therefore GI consult not warranted. We'll transition patient to PO antibiotics. 3. HTN; controlled       4. DVT prophylaxis; continue Lovenox       5. 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 Have ordered a CT angiogram to verify aneurysm. Plan discussed with patient and wife         6. Back pain ; patient status post multiple surgeries to back, back pain exacerbated by staying in hospital bed. We'll start on PRN Tramadol  , Code Status: Full Family Communication: Spoke with wife Disposition Plan: We'll obtain CTA and upon discharge she will followup with outpatient neurosurgery as otherwise indicated    Consultants: PT, OT, cardiovascular.   Procedures:    Antibiotics:  Day 2 of metronidazole plus ciprofloxacin.  HPI/Subjective: 62 yo WMPMHx HTN, Recurrent diverticulitis. Presented to ED c/o Lt lower quadrant abdominal pain yesterday without any nausea or vomiting which is typical of his diverticulitis. Patient had some leftover ciprofloxacin and Flagyl at home and started it yesterday evening. This morning he went to work and at around 10:30am, patient started having headache radiating to Lt jaw and left sided facial numbness. Noticed trouble ambulating and was staggering with his gait. One of his coworkers took Pt to PCP. 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 Patient's PCP, Dr. Jeannetta Nap referred the patient to ED for the concern of acute stroke. Patient's wife also noticed that patient was somewhat  confused and had slurred speech around 12pm when she saw him. Patient denies any prior history of stroke, he's not on any aspirin. TODAY patient states all symptoms have resolved. Further investigation into patient's diverticulitis we feel that patient's last bout requiring antibiotics was 2012   Objective: Filed Vitals:   07/09/12 2126 07/10/12 0202 07/10/12 0526 07/10/12 0931  BP: 126/75 124/78 153/69 138/76  Pulse: 66 67 82 68  Temp: 98.5 F (36.9 C) 97.7 F (36.5 C) 97.4 F (36.3 C) 98.9 F (37.2 C)  TempSrc: Oral Oral Oral Oral  Resp: 20 20 18 20   Height:      Weight:      SpO2: 96% 97% 97% 96%    Intake/Output Summary (Last 24 hours) at 07/10/12 1016 Last data filed at 07/10/12 7846  Gross per 24 hour  Intake    120 ml  Output    400 ml  Net   -280 ml   Filed Weights   07/08/12 2034  Weight: 112.492 kg (248 lb)    Exam:  General: Alert and oriented x4  Cardiovascular: Regular rhythm and rate, negative murmurs rubs or gallops Respiratory: Clear to auscultation bilaterally  Abdomen: Soft nontender nondistended plus bowel sounds  Neurologic: Pupils equal round reactive to light and accommodation, renal nerves II through XII intact, upper extremity sensation equal, upper extremity 2. discrimination within normal limits, lower extremity sensation decreased bilaterally (per patient baseline after fall and multiple surgeries),, negative Romberg, mild positive pronator drift right arm.  Data Reviewed: Basic Metabolic Panel:  Recent Labs Lab 07/08/12 1400 07/08/12 1416  NA 136 139  K 3.6 3.2*  CL  99 100  CO2 25  --   GLUCOSE 93 97  BUN 25* 26*  CREATININE 0.90 1.10  CALCIUM 9.0  --    Liver Function Tests: No results found for this basename: AST, ALT, ALKPHOS, BILITOT, PROT, ALBUMIN,  in the last 168 hours No results found for this basename: LIPASE, AMYLASE,  in the last 168 hours No results found for this basename: AMMONIA,  in the last 168  hours CBC:  Recent Labs Lab 07/08/12 1400 07/08/12 1416  WBC 6.8  --   HGB 13.0 13.9  HCT 38.4* 41.0  MCV 85.5  --   PLT 243  --    Cardiac Enzymes:  Recent Labs Lab 07/08/12 1624  TROPONINI <0.30   BNP (last 3 results) No results found for this basename: PROBNP,  in the last 8760 hours CBG:  Recent Labs Lab 07/08/12 1412  GLUCAP 94    No results found for this or any previous visit (from the past 240 hour(s)).   Studies: Ct Head Wo Contrast  07/08/2012   *RADIOLOGY REPORT*  Clinical Data: Weakness, loss of consciousness.  Left-sided head pain.  CT HEAD WITHOUT CONTRAST  Technique:  Contiguous axial images were obtained from the base of the skull through the vertex without contrast.  Comparison: None  Findings: No acute intracranial abnormality.  Specifically, no hemorrhage, hydrocephalus, mass lesion, acute infarction, or significant intracranial injury.  No acute calvarial abnormality.  Orbital soft tissues are unremarkable.  Mastoids are clear.  IMPRESSION: No intracranial abnormality.   Original Report Authenticated By: Charlett Nose, M.D.   Mri Brain Without Contrast  07/09/2012   *RADIOLOGY REPORT*  Clinical Data:  Headache with left-sided facial numbness and slurred speech with unsteady gait.  Confusion.  Hypertension.  MRI BRAIN WITHOUT CONTRAST MRA HEAD WITHOUT CONTRAST  Technique: Multiplanar, multiecho pulse sequences of the brain and surrounding structures were obtained according to standard protocol without intravenous contrast.  Angiographic images of the head were obtained using MRA technique without contrast.  Comparison: 07/08/2012 head CT.  No comparison brain MR.  MRI HEAD  Findings:  No acute infarct.  No intracranial hemorrhage.  Minimal small vessel disease type changes.  No hydrocephalus.  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  No intracranial mass lesion detected on this unenhanced exam.  Left maxillary sinus tiny air fluid level.  Sphenoid sinus air cell mucosal thickening/partial opacification greater on the right. Minimal mucosal thickening right maxillary sinus.  Partial opacification right mastoid air cells.  No obvious mass at the level of the eustachian tube drainage.  Slight decreased signal intensity bone marrow may be related to the patient's habitus or anemia.  Cervical medullary junction, pituitary region, pineal region and orbital structures unremarkable.  IMPRESSION: No acute infarct.  Please see above.  MRA HEAD  Findings: Ectatic left internal carotid artery supraclinoid segment.  Fetal type origin of the posterior cerebral arteries bilaterally.  Slightly bulbous appearance anterior communicating artery region without discrete aneurysm noted.  Right vertebral artery is dominant.  Mild narrowing of portions of the right vertebral artery.  Poor delineation of the right PICA.  Irregular narrowed left vertebral artery which becomes significantly smaller after takeoff of the left PICA.  Left PICA there is poorly delineated.  Artifact in this region limits evaluation.  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  Bulbous appearance of the basilar  tip.  This may be related to the fetal type origin of the posterior cerebral arteries although 2.4 mm aneurysm not excluded.  Posterior cerebral artery branch vessel narrowing irregularity more notable on the left.  IMPRESSION:  Atherosclerotic type changes most notable involving the posterior circulation.  Please see above.  Bulbous appearance of the basilar tip.  This may be related to the fetal type origin of the posterior cerebral arteries although 2.4 mm aneurysm not excluded.   Original Report Authenticated By: Lacy Duverney, M.D.   Ct Abdomen Pelvis W Contrast  07/08/2012   *RADIOLOGY REPORT*  Clinical Data: Weakness.  Left  lower quadrant pain.  CT ABDOMEN AND PELVIS WITH CONTRAST  Technique:  Multidetector CT imaging of the abdomen and pelvis was performed following the standard protocol during bolus administration of intravenous contrast.  Contrast: OMNIPAQUE IOHEXOL 300 MG/ML  SOLN  Comparison: None.  Findings: Lung bases are clear.  No effusions.  Heart is normal size.  Liver, spleen, pancreas, adrenals, left kidney are unremarkable. Small nonobstructing stones in the right kidney.  No ureteral stones.  No hydronephrosis.  Appendix is visualized and is normal.  Stomach grossly unremarkable.  Small bowel is decompressed.  Scattered distal descending colonic and sigmoid diverticulosis. There is subtle stranding around the distal descending colon, best seen on coronal image 50 compatible with early acute diverticulitis changes.  No free air.  No focal fluid collection.  Urinary bladder is unremarkable.  Aorta is normal caliber.  Degenerative disc and facet disease throughout the thoracic and lumbar spine.  No acute bony abnormality.  IMPRESSION: Descending colonic and sigmoid diverticulosis.  Subtle stranding around the distal descending colon compatible with early acute diverticulitis.  Right nephrolithiasis.   Original Report Authenticated By: Charlett Nose, M.D.   Mr Mra Head/brain Wo Cm  07/09/2012   *RADIOLOGY REPORT*  Clinical Data:  Headache with left-sided facial numbness and slurred speech with unsteady gait.  Confusion.  Hypertension.  MRI BRAIN WITHOUT CONTRAST MRA HEAD WITHOUT CONTRAST  Technique: Multiplanar, multiecho pulse sequences of the brain and surrounding structures were obtained according to standard protocol without intravenous contrast.  Angiographic images of the head were obtained using MRA technique without contrast.  Comparison: 07/08/2012 head CT.  No comparison brain MR.  MRI HEAD  Findings:  No acute infarct.  No intracranial hemorrhage.  Minimal small vessel disease type changes.  No  hydrocephalus.  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  No intracranial mass lesion detected on this unenhanced exam.  Left maxillary sinus tiny air fluid level. Sphenoid sinus air cell mucosal thickening/partial opacification greater on the right. Minimal mucosal thickening right maxillary sinus.  Partial opacification right mastoid air cells.  No obvious mass at the level of the eustachian tube drainage.  Slight decreased signal intensity bone marrow may be related to the patient's habitus or anemia.  Cervical medullary junction, pituitary region, pineal region and orbital structures unremarkable.  IMPRESSION: No acute infarct.  Please see above.  MRA HEAD  Findings: Ectatic left internal carotid artery supraclinoid segment.  Fetal type origin of the posterior cerebral arteries bilaterally.  Slightly bulbous appearance anterior communicating artery region without discrete aneurysm noted.  Right vertebral artery is dominant.  Mild narrowing of portions of the right vertebral artery.  Poor delineation of the right PICA.  Irregular narrowed left vertebral artery which becomes significantly smaller after takeoff of the left PICA.  Left PICA there is poorly delineated.  Artifact in this region limits evaluation.  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  Bulbous appearance of the basilar tip.  This may be related to the fetal type origin of the posterior cerebral arteries although 2.4 mm aneurysm not excluded.  Posterior cerebral artery branch vessel narrowing irregularity more notable on the left.  IMPRESSION:  Atherosclerotic type changes most notable involving the posterior circulation.  Please see above.  Bulbous appearance of the basilar tip.  This may be related to the fetal type origin of the posterior cerebral arteries  although 2.4 mm aneurysm not excluded.   Original Report Authenticated By: Lacy Duverney, M.D.    Scheduled Meds: . aspirin  325 mg Oral Daily  . ciprofloxacin  400 mg Intravenous Q12H  . enoxaparin (LOVENOX) injection  40 mg Subcutaneous Q24H  . metronidazole  500 mg Intravenous Q8H  . naproxen  250 mg Oral BID WC  . sertraline  100 mg Oral Daily   Continuous Infusions: . sodium chloride 1,000 mL (07/09/12 1435)    Principal Problem:   TIA (transient ischemic attack) Active Problems:   Diverticulitis   Hypertension    Time spent: 30 minutes    WOODS, CURTIS, J  [facility] Pager [phone]. If 7PM-7AM, please contact night-coverage at [email or web address], password [facility] 07/10/2012, 10:16 AM  LOS: 2 days

## 2012-07-11 LAB — GLUCOSE, CAPILLARY
Glucose-Capillary: 83 mg/dL (ref 70–99)
Glucose-Capillary: 91 mg/dL (ref 70–99)

## 2012-07-11 MED ORDER — SERTRALINE HCL 100 MG PO TABS: 100.0000 mg | ORAL_TABLET | Freq: Every day | ORAL | Status: DC

## 2012-07-11 MED ORDER — CIPROFLOXACIN HCL 500 MG PO TABS
500.0000 mg | ORAL_TABLET | Freq: Two times a day (BID) | ORAL | Status: DC
Start: 2012-07-11 — End: 2014-05-01

## 2012-07-11 MED ORDER — TRAMADOL HCL 50 MG PO TABS: 50.0000 mg | ORAL_TABLET | Freq: Four times a day (QID) | ORAL | Status: DC | PRN

## 2012-07-11 MED ORDER — ASPIRIN 325 MG PO TABS: 325.0000 mg | ORAL_TABLET | Freq: Every day | ORAL | Status: DC

## 2012-07-11 MED ORDER — METRONIDAZOLE 500 MG PO TABS: 500.0000 mg | ORAL_TABLET | Freq: Three times a day (TID) | ORAL | Status: DC

## 2012-07-11 MED ORDER — HYDROCHLOROTHIAZIDE 50 MG PO TABS: ORAL_TABLET | ORAL | Status: DC

## 2012-07-11 MED ORDER — LISINOPRIL 20 MG PO TABS: ORAL_TABLET | ORAL | Status: DC

## 2012-07-11 NOTE — Discharge Summary (Signed)
Physician Discharge Summary  Kurt [name] UEA:[id number] DOB: 1950-06-19 DOA: 07/08/2012  PCP: Kaleen Mask, MD  Admit date: 07/08/2012 Discharge date: 07/11/2012  Time spent: 30 minutes  Recommendations for Outpatient Follow-up:  1. Aneurysm; head CTA conducted on 20 June showed no aneurysm.  2. TIA; discussed with patient and wife on 20 June cardiac echo verified no source of emboli to cause neurological symptoms. However did show grade 1 diastolic dysfunction with moderately dilated left atrium.  3. Diverticulitis; patient discharged on appropriate antibiotics  Discharge Diagnoses:  Principal Problem:   TIA (transient ischemic attack) Active Problems:   Diverticulitis   Hypertension   Discharge Condition: Stable Diet recommendation: Heart healthy  Filed Weights   07/08/12 2034  Weight: 112.492 kg (248 lb)    History of present illness:  62 yo WMPMHx HTN, Recurrent diverticulitis. Presented to ED c/o Lt lower quadrant abdominal pain yesterday without any nausea or vomiting which is typical of his diverticulitis. Patient had some leftover ciprofloxacin and Flagyl at home and started it yesterday evening (Last diverticulitis requiring antibiotics was 2012). This morning he went to work and at around 10:30am, patient started having headache radiating to Lt jaw and left sided facial numbness. Noticed trouble ambulating and was staggering with his gait. One of his coworkers took Pt to PCP. 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 Patient's PCP, Dr. Jeannetta Nap referred the patient to ED for the concern of acute stroke. Patient's wife also noticed that patient was somewhat confused and had slurred speech around 12pm when she saw him. Patient denies any prior history of stroke, he's not on any aspirin. TODAY patient states all symptoms have resolved.    Hospital Course:  Patient presented to hospital with signs and symptoms consistent  with acute CVA (headache, left-sided facial weakness, confusion, gait anomaly). MRA/MRI were negative for CVA, however MRA showed a possible 2.63mm aneurysm, which was ruled out by CTA patient also presented with signs and symptoms consistent with diverticulitis which was verified by abdominal CT, patient started on appropriate antibiotics.   Procedures:    Consultations:  Discharge Exam: Filed Vitals:   07/10/12 1904 07/10/12 2111 07/11/12 0306 07/11/12 0543  BP: 116/62 145/82 130/78 147/70  Pulse: 70 65 65 59  Temp: 98.9 F (37.2 C) 98.8 F (37.1 C) 97.6 F (36.4 C) 97.3 F (36.3 C)  TempSrc: Oral Oral Oral Oral  Resp: 20 20 20 20   Height:      Weight:      SpO2: 96% 97% 95% 96%    General: Stable not in acute distress Cardiovascular: Regular rhythm and rate, negative murmurs rubs or gallops, DP/PT pulse 2+ Respiratory: Clear to auscultation bilateral  Discharge Instructions     Medication List    ASK your doctor about these medications       hydrochlorothiazide 25 MG tablet  Commonly known as:  HYDRODIURIL  Take 25 mg by mouth daily.     ibuprofen 200 MG tablet  Commonly known as:  ADVIL,MOTRIN  Take 400 mg by mouth 2 (two) times daily.     naproxen sodium 220 MG tablet  Commonly known as:  ANAPROX  Take 220 mg by mouth 2 (two) times daily with a meal.     sertraline 100 MG tablet  Commonly known as:  ZOLOFT  Take 100 mg by mouth daily.       No Known Allergies    The results of significant diagnostics from  this hospitalization (including imaging, microbiology, ancillary and laboratory) are listed below for reference.    Significant Diagnostic Studies: Ct Angio Head W/cm &/or Wo Cm  07/10/2012   *RADIOLOGY REPORT*  Clinical Data:  62 year old male with left facial numbness, slurred speech, confusion.  Abnormal basilar artery tip on the recent MRA.  CT ANGIOGRAPHY HEAD  Technique:  Multidetector CT imaging of the head was performed using the standard  protocol during bolus administration of intravenous contrast.  Multiplanar CT image reconstructions including MIPs were obtained to evaluate the vascular anatomy.  Contrast: 50mL OMNIPAQUE IOHEXOL 350 MG/ML SOLN  Comparison:  Brain MRI and MRA 07/08/2012.  Head CT without contrast 07/08/2012.  Findings:  Cerebral volume is within normal limits for age.  No midline shift, ventriculomegaly, mass effect, evidence of mass lesion, intracranial hemorrhage or evidence of cortically based acute infarction.  Gray-white matter differentiation is within normal limits throughout the brain.  Stable paranasal sinuses and mastoids. No acute osseous abnormality identified.  Visualized orbits and scalp soft tissues are within normal limits.  No abnormal enhancement identified.  Vascular Findings: Major intracranial venous structures are normally enhancing.  Mildly dominant distal right vertebral artery again noted.  Normal right PICA.  Tortuous but otherwise negative vertebrobasilar junction.  Diminutive basilar artery with fetal type bilateral PCA origins.  No basilar stenosis.  SCA origins are normal.  No aneurysm at the basilar tip.  Both posterior communicating arteries are within normal limits.  Bilateral PCA branches are within normal limits.  Both ICA siphons are patent with minimal atherosclerosis.  No ICA siphon stenosis.  Ophthalmic and posterior communicating artery origins are within normal limits.  Normal carotid termini, MCA and ACA origins.  Dominant left ACA A1 segment.  Anterior communicating artery and bilateral ACA branches are within normal limits.  MCA M1 segments are within normal limits.  Left MCA branches are within normal limits.  Right MCA branches are stable, with a simple fide branching pattern compared to the left.  No major right MCA branch occlusion is identified.   Review of the MIP images confirms the above findings.  IMPRESSION: 1.  No basilar tip aneurysm.  Negative posterior circulation with  incidental fetal PCA origins. 2.  Negative anterior circulation. 3. Normal CT appearance of the brain.   Original Report Authenticated By: Erskine Speed, M.D.   Ct Head Wo Contrast  07/08/2012   *RADIOLOGY REPORT*  Clinical Data: Weakness, loss of consciousness.  Left-sided head pain.  CT HEAD WITHOUT CONTRAST  Technique:  Contiguous axial images were obtained from the base of the skull through the vertex without contrast.  Comparison: None  Findings: No acute intracranial abnormality.  Specifically, no hemorrhage, hydrocephalus, mass lesion, acute infarction, or significant intracranial injury.  No acute calvarial abnormality.  Orbital soft tissues are unremarkable.  Mastoids are clear.  IMPRESSION: No intracranial abnormality.   Original Report Authenticated By: Charlett Nose, M.D.   Mri Brain Without Contrast  07/09/2012   *RADIOLOGY REPORT*  Clinical Data:  Headache with left-sided facial numbness and slurred speech with unsteady gait.  Confusion.  Hypertension.  MRI BRAIN WITHOUT CONTRAST MRA HEAD WITHOUT CONTRAST  Technique: Multiplanar, multiecho pulse sequences of the brain and surrounding structures were obtained according to standard protocol without intravenous contrast.  Angiographic images of the head were obtained using MRA technique without contrast.  Comparison: 07/08/2012 head CT.  No comparison brain MR.  MRI HEAD  Findings:  No acute infarct.  No intracranial hemorrhage.  Minimal small vessel  disease type changes.  No hydrocephalus.  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  No intracranial mass lesion detected on this unenhanced exam.  Left maxillary sinus tiny air fluid level. Sphenoid sinus air cell mucosal thickening/partial opacification greater on the right. Minimal mucosal thickening right maxillary sinus.  Partial opacification right mastoid air cells.  No obvious mass at the level of the eustachian tube drainage.  Slight decreased signal intensity bone marrow may  be related to the patient's habitus or anemia.  Cervical medullary junction, pituitary region, pineal region and orbital structures unremarkable.  IMPRESSION: No acute infarct.  Please see above.  MRA HEAD  Findings: Ectatic left internal carotid artery supraclinoid segment.  Fetal type origin of the posterior cerebral arteries bilaterally.  Slightly bulbous appearance anterior communicating artery region without discrete aneurysm noted.  Right vertebral artery is dominant.  Mild narrowing of portions of the right vertebral artery.  Poor delineation of the right PICA.  Irregular narrowed left vertebral artery which becomes significantly smaller after takeoff of the left PICA.  Left PICA there is poorly delineated.  Artifact in this region limits evaluation.  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  Bulbous appearance of the basilar tip.  This may be related to the fetal type origin of the posterior cerebral arteries although 2.4 mm aneurysm not excluded.  Posterior cerebral artery branch vessel narrowing irregularity more notable on the left.  IMPRESSION:  Atherosclerotic type changes most notable involving the posterior circulation.  Please see above.  Bulbous appearance of the basilar tip.  This may be related to the fetal type origin of the posterior cerebral arteries although 2.4 mm aneurysm not excluded.   Original Report Authenticated By: Lacy Duverney, M.D.   Ct Abdomen Pelvis W Contrast  07/08/2012   *RADIOLOGY REPORT*  Clinical Data: Weakness.  Left lower quadrant pain.  CT ABDOMEN AND PELVIS WITH CONTRAST  Technique:  Multidetector CT imaging of the abdomen and pelvis was performed following the standard protocol during bolus administration of intravenous contrast.  Contrast: OMNIPAQUE IOHEXOL 300 MG/ML  SOLN  Comparison: None.   Findings: Lung bases are clear.  No effusions.  Heart is normal size.  Liver, spleen, pancreas, adrenals, left kidney are unremarkable. Small nonobstructing stones in the right kidney.  No ureteral stones.  No hydronephrosis.  Appendix is visualized and is normal.  Stomach grossly unremarkable.  Small bowel is decompressed.  Scattered distal descending colonic and sigmoid diverticulosis. There is subtle stranding around the distal descending colon, best seen on coronal image 50 compatible with early acute diverticulitis changes.  No free air.  No focal fluid collection.  Urinary bladder is unremarkable.  Aorta is normal caliber.  Degenerative disc and facet disease throughout the thoracic and lumbar spine.  No acute bony abnormality.  IMPRESSION: Descending colonic and sigmoid diverticulosis.  Subtle stranding around the distal descending colon compatible with early acute diverticulitis.  Right nephrolithiasis.   Original Report Authenticated By: Charlett Nose, M.D.   Mr Mra Head/brain Wo Cm  07/09/2012   *RADIOLOGY REPORT*  Clinical Data:  Headache with left-sided facial numbness and slurred speech with unsteady gait.  Confusion.  Hypertension.  MRI BRAIN WITHOUT CONTRAST MRA HEAD WITHOUT CONTRAST  Technique: Multiplanar, multiecho pulse sequences of the brain and surrounding structures were obtained according to standard protocol without intravenous contrast.  Angiographic images of the head  were obtained using MRA technique without contrast.  Comparison: 07/08/2012 head CT.  No comparison brain MR.  MRI HEAD  Findings:  No acute infarct.  No intracranial hemorrhage.  Minimal small vessel disease type changes.  No hydrocephalus.  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  No intracranial mass lesion detected on this unenhanced exam.  Left maxillary sinus tiny air fluid level. Sphenoid sinus air cell mucosal thickening/partial opacification greater on the right. Minimal mucosal thickening  right maxillary sinus.  Partial opacification right mastoid air cells.  No obvious mass at the level of the eustachian tube drainage.  Slight decreased signal intensity bone marrow may be related to the patient's habitus or anemia.  Cervical medullary junction, pituitary region, pineal region and orbital structures unremarkable.  IMPRESSION: No acute infarct.  Please see above.  MRA HEAD  Findings: Ectatic left internal carotid artery supraclinoid segment.  Fetal type origin of the posterior cerebral arteries bilaterally.  Slightly bulbous appearance anterior communicating artery region without discrete aneurysm noted.  Right vertebral artery is dominant.  Mild narrowing of portions of the right vertebral artery.  Poor delineation of the right PICA.  Irregular narrowed left vertebral artery which becomes significantly smaller after takeoff of the left PICA.  Left PICA there is poorly delineated.  Artifact in this region limits evaluation.  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  Bulbous appearance of the basilar tip.  This may be related to the fetal type origin of the posterior cerebral arteries although 2.4 mm aneurysm not excluded.  Posterior cerebral artery branch vessel narrowing irregularity more notable on the left.  IMPRESSION:  Atherosclerotic type changes most notable involving the posterior circulation.  Please see above.  Bulbous appearance of the basilar tip.  This may be related to the fetal type origin of the posterior cerebral arteries although 2.4 mm aneurysm not excluded.   Original Report Authenticated By: Lacy Duverney, M.D.    Microbiology: No results found for this or any previous visit (from the past 240 hour(s)).   Labs: Basic Metabolic Panel:  Recent Labs Lab 07/08/12 1400 07/08/12 1416  NA 136 139  K 3.6 3.2*  CL  99 100  CO2 25  --   GLUCOSE 93 97  BUN 25* 26*  CREATININE 0.90 1.10  CALCIUM 9.0  --    Liver Function Tests: No results found for this basename: AST, ALT, ALKPHOS, BILITOT, PROT, ALBUMIN,  in the last 168 hours No results found for this basename: LIPASE, AMYLASE,  in the last 168 hours No results found for this basename: AMMONIA,  in the last 168 hours CBC:  Recent Labs Lab 07/08/12 1400 07/08/12 1416  WBC 6.8  --   HGB 13.0 13.9  HCT 38.4* 41.0  MCV 85.5  --   PLT 243  --    Cardiac Enzymes:  Recent Labs Lab 07/08/12 1624  TROPONINI <0.30   BNP: BNP (last 3 results) No results found for this basename: PROBNP,  in the last 8760 hours CBG:  Recent Labs Lab 07/08/12 1412 07/10/12 2114  GLUCAP 94 91       Signed:  WOODS, CURTIS, J  [facility] 07/11/2012, 6:55 AM

## 2012-07-11 NOTE — Progress Notes (Signed)
Pt discharged. Discharged instructions given. Pt verbalized understanding.

## 2014-01-21 DIAGNOSIS — K922 Gastrointestinal hemorrhage, unspecified: Secondary | ICD-10-CM

## 2014-01-21 HISTORY — DX: Gastrointestinal hemorrhage, unspecified: K92.2

## 2014-04-29 ENCOUNTER — Encounter (HOSPITAL_COMMUNITY): Payer: Self-pay | Admitting: Emergency Medicine

## 2014-04-29 ENCOUNTER — Emergency Department (HOSPITAL_COMMUNITY): Payer: Medicare Other

## 2014-04-29 ENCOUNTER — Inpatient Hospital Stay (HOSPITAL_COMMUNITY)
Admission: EM | Admit: 2014-04-29 | Discharge: 2014-05-01 | DRG: 327 | Disposition: A | Discharge status: Home / Self Care | Payer: Medicare Other | Attending: Internal Medicine | Admitting: Internal Medicine

## 2014-04-29 DIAGNOSIS — D62 Acute posthemorrhagic anemia: Secondary | ICD-10-CM | POA: Diagnosis not present

## 2014-04-29 DIAGNOSIS — I959 Hypotension, unspecified: Secondary | ICD-10-CM | POA: Diagnosis present

## 2014-04-29 DIAGNOSIS — K922 Gastrointestinal hemorrhage, unspecified: Secondary | ICD-10-CM | POA: Diagnosis present

## 2014-04-29 DIAGNOSIS — K264 Chronic or unspecified duodenal ulcer with hemorrhage: Secondary | ICD-10-CM | POA: Diagnosis present

## 2014-04-29 DIAGNOSIS — I5032 Chronic diastolic (congestive) heart failure: Secondary | ICD-10-CM | POA: Diagnosis present

## 2014-04-29 DIAGNOSIS — D649 Anemia, unspecified: Secondary | ICD-10-CM

## 2014-04-29 DIAGNOSIS — Z79899 Other long term (current) drug therapy: Secondary | ICD-10-CM | POA: Diagnosis not present

## 2014-04-29 DIAGNOSIS — M549 Dorsalgia, unspecified: Secondary | ICD-10-CM | POA: Diagnosis present

## 2014-04-29 DIAGNOSIS — J984 Other disorders of lung: Secondary | ICD-10-CM | POA: Diagnosis present

## 2014-04-29 DIAGNOSIS — K259 Gastric ulcer, unspecified as acute or chronic, without hemorrhage or perforation: Secondary | ICD-10-CM | POA: Diagnosis present

## 2014-04-29 DIAGNOSIS — N179 Acute kidney failure, unspecified: Secondary | ICD-10-CM

## 2014-04-29 DIAGNOSIS — G8929 Other chronic pain: Secondary | ICD-10-CM | POA: Diagnosis present

## 2014-04-29 DIAGNOSIS — Z7982 Long term (current) use of aspirin: Secondary | ICD-10-CM | POA: Diagnosis not present

## 2014-04-29 DIAGNOSIS — Z8673 Personal history of transient ischemic attack (TIA), and cerebral infarction without residual deficits: Secondary | ICD-10-CM

## 2014-04-29 DIAGNOSIS — K5792 Diverticulitis of intestine, part unspecified, without perforation or abscess without bleeding: Secondary | ICD-10-CM | POA: Diagnosis present

## 2014-04-29 DIAGNOSIS — I10 Essential (primary) hypertension: Secondary | ICD-10-CM | POA: Diagnosis present

## 2014-04-29 DIAGNOSIS — R0600 Dyspnea, unspecified: Secondary | ICD-10-CM | POA: Diagnosis not present

## 2014-04-29 DIAGNOSIS — N189 Chronic kidney disease, unspecified: Secondary | ICD-10-CM | POA: Diagnosis present

## 2014-04-29 HISTORY — DX: Chronic kidney disease, unspecified: N17.9

## 2014-04-29 HISTORY — DX: Heart failure, unspecified: I50.9

## 2014-04-29 HISTORY — DX: Dorsalgia, unspecified: M54.9

## 2014-04-29 HISTORY — DX: Transient cerebral ischemic attack, unspecified: G45.9

## 2014-04-29 HISTORY — DX: Diverticulitis of intestine, part unspecified, without perforation or abscess without bleeding: K57.92

## 2014-04-29 HISTORY — DX: Other chronic pain: G89.29

## 2014-04-29 LAB — HEMOGLOBIN AND HEMATOCRIT, BLOOD
HCT: 25.4 % — ABNORMAL LOW (ref 39.0–52.0)
HEMATOCRIT: 27.5 % — AB (ref 39.0–52.0)
HEMOGLOBIN: 8.2 g/dL — AB (ref 13.0–17.0)
Hemoglobin: 9 g/dL — ABNORMAL LOW (ref 13.0–17.0)

## 2014-04-29 LAB — COMPREHENSIVE METABOLIC PANEL
ALBUMIN: 3.7 g/dL (ref 3.5–5.2)
ALK PHOS: 45 U/L (ref 39–117)
ALT: 13 U/L (ref 0–53)
AST: 15 U/L (ref 0–37)
Anion gap: 7 (ref 5–15)
BUN: 72 mg/dL — ABNORMAL HIGH (ref 6–23)
CO2: 25 mmol/L (ref 19–32)
Calcium: 9.6 mg/dL (ref 8.4–10.5)
Chloride: 103 mmol/L (ref 96–112)
Creatinine, Ser: 1.45 mg/dL — ABNORMAL HIGH (ref 0.50–1.35)
GFR calc non Af Amer: 49 mL/min — ABNORMAL LOW (ref >90)
GFR, EST AFRICAN AMERICAN: 57 mL/min — AB (ref >90)
GLUCOSE: 117 mg/dL — AB (ref 70–99)
POTASSIUM: 4.6 mmol/L (ref 3.5–5.1)
Sodium: 135 mmol/L (ref 135–145)
TOTAL PROTEIN: 6.6 g/dL (ref 6.0–8.3)
Total Bilirubin: 0.7 mg/dL (ref 0.3–1.2)

## 2014-04-29 LAB — PROTIME-INR
INR: 1.1 (ref 0.00–1.49)
Prothrombin Time: 14.4 seconds (ref 11.6–15.2)

## 2014-04-29 LAB — CBC WITH DIFFERENTIAL/PLATELET
Basophils Absolute: 0.1 10*3/uL (ref 0.0–0.1)
Basophils Relative: 1 % (ref 0–1)
Eosinophils Absolute: 0.4 10*3/uL (ref 0.0–0.7)
Eosinophils Relative: 4 % (ref 0–5)
HEMATOCRIT: 29.9 % — AB (ref 39.0–52.0)
HEMOGLOBIN: 9.8 g/dL — AB (ref 13.0–17.0)
Lymphocytes Relative: 11 % — ABNORMAL LOW (ref 12–46)
Lymphs Abs: 1.2 10*3/uL (ref 0.7–4.0)
MCH: 28.2 pg (ref 26.0–34.0)
MCHC: 32.8 g/dL (ref 30.0–36.0)
MCV: 86.2 fL (ref 78.0–100.0)
MONOS PCT: 6 % (ref 3–12)
Monocytes Absolute: 0.6 10*3/uL (ref 0.1–1.0)
NEUTROS ABS: 8.4 10*3/uL — AB (ref 1.7–7.7)
Neutrophils Relative %: 78 % — ABNORMAL HIGH (ref 43–77)
Platelets: 276 10*3/uL (ref 150–400)
RBC: 3.47 MIL/uL — AB (ref 4.22–5.81)
RDW: 13.5 % (ref 11.5–15.5)
WBC: 10.6 10*3/uL — ABNORMAL HIGH (ref 4.0–10.5)

## 2014-04-29 LAB — ABO/RH: ABO/RH(D): A POS

## 2014-04-29 LAB — TYPE AND SCREEN
ABO/RH(D): A POS
Antibody Screen: NEGATIVE

## 2014-04-29 LAB — POC OCCULT BLOOD, ED: Fecal Occult Bld: POSITIVE — AB

## 2014-04-29 LAB — TROPONIN I: Troponin I: <0.03 ng/mL (ref <0.031)

## 2014-04-29 MED ORDER — SODIUM CHLORIDE 0.9 % IV SOLN
INTRAVENOUS | Status: DC
  Administered 2014-04-29: 100 mL/h via INTRAVENOUS
  Administered 2014-04-29 – 2014-05-01 (×4): via INTRAVENOUS

## 2014-04-29 MED ORDER — SODIUM CHLORIDE 0.9 % IV SOLN: INTRAVENOUS | Status: AC

## 2014-04-29 MED ORDER — SODIUM CHLORIDE 0.9 % IV BOLUS (SEPSIS)
250.0000 mL | Freq: Once | INTRAVENOUS | Status: AC
  Administered 2014-04-29: 250 mL via INTRAVENOUS

## 2014-04-29 MED ORDER — SODIUM CHLORIDE 0.9 % IV BOLUS (SEPSIS)
500.0000 mL | Freq: Once | INTRAVENOUS | Status: AC
  Administered 2014-04-29: 500 mL via INTRAVENOUS

## 2014-04-29 MED ORDER — SODIUM CHLORIDE 0.9 % IV SOLN
8.0000 mg/h | INTRAVENOUS | Status: DC
  Administered 2014-04-29 – 2014-05-01 (×4): 8 mg/h via INTRAVENOUS
  Filled 2014-04-29 (×9): qty 80

## 2014-04-29 MED ORDER — SODIUM CHLORIDE 0.9 % IV SOLN
80.0000 mg | Freq: Once | INTRAVENOUS | Status: AC
  Administered 2014-04-29: 80 mg via INTRAVENOUS
  Filled 2014-04-29: qty 80

## 2014-04-29 NOTE — H&P (Signed)
History and Physical    Kurt GlasgowFloyd N Teo ZOX:[id number] DOB: Jan 13, 1951 DOA: 04/29/2014  Referring physician: Dr. Clarene DukeMcManus PCP: Kaleen MaskELKINS,WILSON OLIVER, MD  Specialists: GI, Dr. Elnoria HowardHung  Chief Complaint: melena, weakness  HPI: Kurt [name] is a 64 y.o. male has a past medical history significant for Hypertension, diverticulitis, prior TIA in 2014 now on full dose aspirin presents to the ER with chief complaint of dark tarry stools for the past 2-3 days. He endorses generalized weakness, progressive dyspnea with activities for the same amount of time. He denies chest pain. He denies recent fever or chills, denies abdominal pain, nausea or vomiting. He first saw a dark stool about 3 days ago, progressively getting worse and today decided to come to the ED. This has never happened to him in the past. He also complains of left hand numbness, he was diagnosed with "nerve problems" and usually gets numbness in his first 2 fingers. He is also taking Advil every day for his back pain. In the ED, patient was orthostatic, mildly tachycardic, has AKI and a hemoglobin of 9.8 from baseline of 13. EDP discussed with Dr. Elnoria HowardHung from GI for consultation and [facility] was asked for admission.   Review of Systems: as per HPI otherwise 10 point ROS negative.   Past Medical History  Diagnosis Date  . Hypertension   . Diverticulitis   . TIA (transient ischemic attack) 2014  . CHF (congestive heart failure)     diastolic dysfunction  . Chronic back pain    Past Surgical History  Procedure Laterality Date  . Nasal septum surgery     Social History:  reports that he has never smoked. He has never used smokeless tobacco. He reports that he drinks alcohol. He reports that he does not use illicit drugs.  No Known Allergies  Negative family history for GI problems  Prior to Admission medications   Medication Sig Start Date End Date Taking? Authorizing Provider  aspirin 325 MG tablet Take 1 tablet (325 mg total) by mouth  daily. 07/11/12  Yes Drema Dallasurtis J Woods, MD  ibuprofen (ADVIL,MOTRIN) 200 MG tablet Take 400 mg by mouth 2 (two) times daily.   Yes Historical Provider, MD  lisinopril (PRINIVIL) 20 MG tablet 1 tab by mouth daily 07/11/12  Yes Drema Dallasurtis J Woods, MD  naproxen sodium (ANAPROX) 220 MG tablet Take 220 mg by mouth 2 (two) times daily with a meal.   Yes Historical Provider, MD  sertraline (ZOLOFT) 100 MG tablet Take 1 tablet (100 mg total) by mouth daily. 07/11/12  Yes Drema Dallasurtis J Woods, MD  triamterene-hydrochlorothiazide (MAXZIDE) 75-50 MG per tablet Take 1 tablet by mouth daily.   Yes Historical Provider, MD  ciprofloxacin (CIPRO) 500 MG tablet Take 1 tablet (500 mg total) by mouth 2 (two) times daily. Patient not taking: Reported on 04/29/2014 07/11/12   Drema Dallasurtis J Woods, MD  hydrochlorothiazide (HYDRODIURIL) 50 MG tablet 1 tab by mouth Daily Patient not taking: Reported on 04/29/2014 07/11/12   Drema Dallasurtis J Woods, MD  metroNIDAZOLE (FLAGYL) 500 MG tablet Take 1 tablet (500 mg total) by mouth every 8 (eight) hours. Patient not taking: Reported on 04/29/2014 07/11/12   Drema Dallasurtis J Woods, MD  traMADol (ULTRAM) 50 MG tablet Take 1 tablet (50 mg total) by mouth every 6 (six) hours as needed (moderate pain). Patient not taking: Reported on 04/29/2014 07/11/12   Drema Dallasurtis J Woods, MD   Physical Exam: Filed Vitals:   04/29/14 1252 04/29/14 1300 04/29/14 1330 04/29/14 1419  BP:  97/61 103/58 113/64 111/59  Pulse: 105 101 99 99  Temp:      TempSrc:      Resp: Height:      Weight:      SpO2: 96% 97% 92% 96%    General:  No apparent distress  Eyes: PERRL, EOMI, no scleral icterus  ENT: moist oropharynx  Neck: supple, no lymphadenopathy  Cardiovascular: regular rate without MRG; 2+ peripheral pulses, no JVD, no peripheral edema  Respiratory: CTA biL, good air movement without wheezing, rhonchi or crackled  Abdomen: soft, non tender to palpation, positive bowel sounds, no guarding, no rebound  Skin: no  rashes  Musculoskeletal: normal bulk and tone, no joint swelling  Psychiatric: normal mood and affect  Neurologic: non focal   Labs on Admission:  Basic Metabolic Panel:  Recent Labs Lab 04/29/14 1122  NA 135  K 4.6  CL 103  CO2 25  GLUCOSE 117*  BUN 72*  CREATININE 1.45*  CALCIUM 9.6   Liver Function Tests:  Recent Labs Lab 04/29/14 1122  AST 15  ALT 13  ALKPHOS 45  BILITOT 0.7  PROT 6.6  ALBUMIN 3.7   CBC:  Recent Labs Lab 04/29/14 1122  WBC 10.6*  NEUTROABS 8.4*  HGB 9.8*  HCT 29.9*  MCV 86.2  PLT 276   Cardiac Enzymes:  Recent Labs Lab 04/29/14 1122  TROPONINI <0.03    Radiological Exams on Admission: Dg Chest 2 View  04/29/2014   CLINICAL DATA:  Shortness of breath. Hypotension. Left arm numbness and dizziness for 2 days. Blood in stools.  EXAM: CHEST  2 VIEW  COMPARISON:  07/08/2012 abdomen CT  FINDINGS: Thoracic spondylosis. Cardiac and mediastinal margins appear normal.  Oval-shaped will 1.1 by 0.4 cm density projects over the right anterior second rib. I do not definitively see this density on the prior frontal cervical spine radiograph of 01/09/2007.  No pleural effusion.  The lungs appear otherwise clear.  IMPRESSION: 1. Oval-shaped density projects over the right upper lobe. Although possibly a bony lesion of the right second rib, I do not see a bony lesion on the prior cervical spine radiograph from 01/09/2007. Accordingly, a neoplastic pulmonary lesion needs to be ruled out, and chest CT is recommended. 2. Thoracic spondylosis.   Electronically Signed   By: Gaylyn Rong M.D.   On: 04/29/2014 11:58    Assessment/Plan Active Problems:   Diverticulitis   Hypertension   GI bleed   AKI (acute kidney injury)   GI bleed / melena - this is likely in the setting of NSAIDs use at home, he is on aspirin as well as taking ibuprofen for back pain on a regular basis. Patient's hemoglobin is 9.8, however his baseline was within normal range.  Gastroenterology was consulted, appreciate input. Continue Protonix, IV fluids. His blood pressure has improved and responded to fluids.  Hypertension - patient reported several low blood pressures at home, despite that he still continues to take his antihypertensives, will hold for now.  Acute kidney injury - with previous normal renal function, this is likely in the setting of perhaps prolonged hypotension at home as well as in the emergency room. Will provide IV fluids, and continue to monitor his renal function in the morning.   Right upper lobe lung ?Density - per radiology, a chest CT is recommended, no need for emergent CT at this point, we'll address problem #1 and consider CT scan prior to discharge.   Dyspnea on exertion -  patient reports a level of shortness of breath with exertion that seems to be a little bit out of proportion with his hemoglobin of 9.8. We'll obtain a 2-D echo.   Diet: NPO Fluids: NS DVT Prophylaxis: SCD  Code Status: Full  Family Communication: d/w patient  Disposition Plan: admit  Time spent: 38  Yasin Ducat M. Elvera Lennox, MD [facility] Pager [phone]  If 7PM-7AM, please contact night-coverage [email or web address] Password [email or web address] 04/29/2014, 2:20 PM

## 2014-04-29 NOTE — ED Notes (Signed)
Attempted to give report 

## 2014-04-29 NOTE — ED Notes (Signed)
Pt states sob when laying down x 2 days.  Also states black, runny stool x 2 days (none today).  C/o L arm numbness yesterday evening.  Only c/o pain to lower back, which is chronic.

## 2014-04-29 NOTE — ED Notes (Signed)
Admitting MD paged RE continued hypotension.

## 2014-04-29 NOTE — ED Notes (Signed)
Pt sob on arrival to ed, reports sob with exertion. Had recent neck pain, left arm numbness and dark stools.

## 2014-04-29 NOTE — Care Management (Signed)
Unit CM UR Completed by [facility] ED CM  W. Mailen Newborn RN  

## 2014-04-29 NOTE — ED Provider Notes (Signed)
CSN: [id number]     Arrival date & time 04/29/14  1041 History   First MD Initiated Contact with Patient 04/29/14 1056     Chief Complaint  Patient presents with  . Shortness of Breath  . Numbness  . Blood In Stools      HPI Pt was seen at 1100. Per pt, c/o gradual onset and worsening of persistent generalized fatigue/weakness for the past 2 days. Has been associated with multiple intermittent episodes of "loose dark stools." Pt states his generalized fatigue worsens on exertion and is associated with SOB. States yesterday the fingers of his left hand "went numb for a while;" since resolved. Denies CP/palpitations, no cough, no back pain, no neck pain, no focal motor weakness, no abd pain, no N/V, no fevers. Pt states he takes ASA daily, as well as motrin BID daily.    Past Medical History  Diagnosis Date  . Hypertension   . Diverticulitis   . TIA (transient ischemic attack) 2014  . CHF (congestive heart failure)     diastolic dysfunction  . Chronic back pain    Past Surgical History  Procedure Laterality Date  . Nasal septum surgery      History  Substance Use Topics  . Smoking status: Never Smoker   . Smokeless tobacco: Never Used  . Alcohol Use: Yes     Comment: occ    Review of Systems ROS: Statement: All systems negative except as marked or noted in the HPI; Constitutional: Negative for fever and chills. +generalized weakness/fatigue. ; ; Eyes: Negative for eye pain, redness and discharge. ; ; ENMT: Negative for ear pain, hoarseness, nasal congestion, sinus pressure and sore throat. ; ; Cardiovascular: Negative for chest pain, palpitations, diaphoresis, and peripheral edema. ; ; Respiratory: +SOB/DOE. Negative for cough, wheezing and stridor. ; ; Gastrointestinal: +diarrhea, dark stools. Negative for nausea, vomiting, abdominal pain, blood in stool, hematemesis, jaundice and rectal bleeding. . ; ; Genitourinary: Negative for dysuria, flank pain and hematuria. ; ;  Musculoskeletal: Negative for back pain and neck pain. Negative for swelling and trauma.; ; Skin: Negative for pruritus, rash, abrasions, blisters, bruising and skin lesion.; ; Neuro: +paresthesias. Negative for headache, lightheadedness and neck stiffness. Negative for altered level of consciousness , altered mental status, extremity weakness, involuntary movement, seizure and syncope.     Allergies  Review of patient's allergies indicates no known allergies.  Home Medications   Prior to Admission medications   Medication Sig Start Date End Date Taking? Authorizing Provider  aspirin 325 MG tablet Take 1 tablet (325 mg total) by mouth daily. 07/11/12  Yes Drema Dallasurtis J Woods, MD  ibuprofen (ADVIL,MOTRIN) 200 MG tablet Take 400 mg by mouth 2 (two) times daily.   Yes Historical Provider, MD  lisinopril (PRINIVIL) 20 MG tablet 1 tab by mouth daily 07/11/12  Yes Drema Dallasurtis J Woods, MD  naproxen sodium (ANAPROX) 220 MG tablet Take 220 mg by mouth 2 (two) times daily with a meal.   Yes Historical Provider, MD  sertraline (ZOLOFT) 100 MG tablet Take 1 tablet (100 mg total) by mouth daily. 07/11/12  Yes Drema Dallasurtis J Woods, MD  triamterene-hydrochlorothiazide (MAXZIDE) 75-50 MG per tablet Take 1 tablet by mouth daily.   Yes Historical Provider, MD  ciprofloxacin (CIPRO) 500 MG tablet Take 1 tablet (500 mg total) by mouth 2 (two) times daily. Patient not taking: Reported on 04/29/2014 07/11/12   Drema Dallasurtis J Woods, MD  hydrochlorothiazide (HYDRODIURIL) 50 MG tablet 1 tab by mouth Daily Patient not  taking: Reported on 04/29/2014 07/11/12   Drema Dallas, MD  metroNIDAZOLE (FLAGYL) 500 MG tablet Take 1 tablet (500 mg total) by mouth every 8 (eight) hours. Patient not taking: Reported on 04/29/2014 07/11/12   Drema Dallas, MD  traMADol (ULTRAM) 50 MG tablet Take 1 tablet (50 mg total) by mouth every 6 (six) hours as needed (moderate pain). Patient not taking: Reported on 04/29/2014 07/11/12   Drema Dallas, MD   BP 110/67 mmHg   Pulse 119  Temp(Src) 97.7 F (36.5 C) (Oral)  Resp 13  Ht 5\' 10"  (1.778 m)  Wt 284 lb (128.822 kg)  BMI 40.75 kg/m2  SpO2 98%   Filed Vitals:   04/29/14 1252 04/29/14 1300 04/29/14 1330 04/29/14 1419  BP: 97/61 103/58 113/64 111/59  Pulse: 105 101 99 99  Temp:      TempSrc:      Resp: 20 21 17 17   Height:      Weight:      SpO2: 96% 97% 92% 96%    11:08:46 Orthostatic Vital Signs BM  Orthostatic Lying  - BP- Lying: 82/66 mmHg ; Pulse- Lying: 110  Orthostatic Sitting - BP- Sitting: 88/52 mmHg (Pt became very lightheaded moving to sitting position) ; Pulse- Sitting: 115  Orthostatic Standing at 0 minutes - BP- Standing at 0 minutes: 74/45 mmHg (increased dizziness) ; Pulse- Standing at 0 minutes: 120        Physical Exam  1105: Physical examination:  Nursing notes reviewed; Vital signs and O2 SAT reviewed;  Constitutional: Well developed, Well nourished, Well hydrated, In no acute distress; Head:  Normocephalic, atraumatic; Eyes: EOMI, PERRL, No scleral icterus. Conjunctiva pale.; ENMT: Mouth and pharynx normal, Mucous membranes moist; Neck: Supple, Full range of motion, No lymphadenopathy; Cardiovascular: Tachycardic rate and rhythm, No gallop; Respiratory: Breath sounds clear & equal bilaterally, No rales, rhonchi, wheezes.  Speaking full sentences with ease, Normal respiratory effort/excursion; Chest: Nontender, Movement normal; Abdomen: Soft, Nontender, Nondistended, Normal bowel sounds. Rectal exam performed w/permission of pt and ED RN chaperone present.  Anal tone normal.  Non-tender, black stool in rectal vault, heme positive.  No fissures, no external hemorrhoids, no palp masses.; Genitourinary: No CVA tenderness; Extremities: Pulses normal, No tenderness, No edema, No calf edema or asymmetry.; Neuro: AA&Ox3, Major CN grossly intact. Speech clear.  No facial droop.  No nystagmus. Grips equal. Strength 5/5 equal bilat UE's and LE's.  DTR 2/4 equal bilat UE's and LE's.  No gross  sensory deficits.  Normal cerebellar testing bilat UE's (finger-nose) and LE's (heel-shin). ; Skin: Color pale, Warm, Dry.   ED Course  Procedures     EKG Interpretation   Date/Time:  Friday April 29 2014 10:45:11 EDT Ventricular Rate:  119 PR Interval:  134 QRS Duration: 86 QT Interval:  326 QTC Calculation: 458 R Axis:   80 Text Interpretation:  Sinus tachycardia Low voltage QRS Borderline ECG  When compared with ECG of 07/08/2012 Rate faster Otherwise no significant  change Confirmed by [facility]  MD, Nicholos Johns [phone]) on 04/29/2014 12:40:47 PM      MDM  MDM Reviewed: previous chart, nursing note and vitals Reviewed previous: labs and ECG Interpretation: labs, ECG and x-ray Total time providing critical care: 30-74 minutes. This excludes time spent performing separately reportable procedures and services. Consults: admitting MD     CRITICAL CARE Performed by: Laray Anger Total critical care time: 35 Critical care time was exclusive of separately billable procedures and treating other patients. Critical care  was necessary to treat or prevent imminent or life-threatening deterioration. Critical care was time spent personally by me on the following activities: development of treatment plan with patient and/or surrogate as well as nursing, discussions with consultants, evaluation of patient's response to treatment, examination of patient, obtaining history from patient or surrogate, ordering and performing treatments and interventions, ordering and review of laboratory studies, ordering and review of radiographic studies, pulse oximetry and re-evaluation of patient's condition.  Results for orders placed or performed during the hospital encounter of 04/29/14  Comprehensive metabolic panel  Result Value Ref Range   Sodium 135 135 - 145 mmol/L   Potassium 4.6 3.5 - 5.1 mmol/L   Chloride 103 96 - 112 mmol/L   CO2 25 19 - 32 mmol/L   Glucose, Bld 117 (H) 70 - 99 mg/dL    BUN 72 (H) 6 - 23 mg/dL   Creatinine, Ser 4.09 (H) 0.50 - 1.35 mg/dL   Calcium 9.6 8.4 - 81.1 mg/dL   Total Protein 6.6 6.0 - 8.3 g/dL   Albumin 3.7 3.5 - 5.2 g/dL   AST 15 0 - 37 U/L   ALT 13 0 - 53 U/L   Alkaline Phosphatase 45 39 - 117 U/L   Total Bilirubin 0.7 0.3 - 1.2 mg/dL   GFR calc non Af Amer 49 (L) >90 mL/min   GFR calc Af Amer 57 (L) >90 mL/min   Anion gap 7 5 - 15  Troponin I  Result Value Ref Range   Troponin I <0.03 <0.031 ng/mL  Protime-INR  Result Value Ref Range   Prothrombin Time 14.4 11.6 - 15.2 seconds   INR 1.10 0.00 - 1.49  CBC with Differential  Result Value Ref Range   WBC 10.6 (H) 4.0 - 10.5 K/uL   RBC 3.47 (L) 4.22 - 5.81 MIL/uL   Hemoglobin 9.8 (L) 13.0 - 17.0 g/dL   HCT 91.4 (L) 78.2 - 95.6 %   MCV 86.2 78.0 - 100.0 fL   MCH 28.2 26.0 - 34.0 pg   MCHC 32.8 30.0 - 36.0 g/dL   RDW 21.3 08.6 - 57.8 %   Platelets 276 150 - 400 K/uL   Neutrophils Relative % 78 (H) 43 - 77 %   Neutro Abs 8.4 (H) 1.7 - 7.7 K/uL   Lymphocytes Relative 11 (L) 12 - 46 %   Lymphs Abs 1.2 0.7 - 4.0 K/uL   Monocytes Relative 6 3 - 12 %   Monocytes Absolute 0.6 0.1 - 1.0 K/uL   Eosinophils Relative 4 0 - 5 %   Eosinophils Absolute 0.4 0.0 - 0.7 K/uL   Basophils Relative 1 0 - 1 %   Basophils Absolute 0.1 0.0 - 0.1 K/uL  POC occult blood, ED  Result Value Ref Range   Fecal Occult Bld POSITIVE (A) NEGATIVE  Type and screen  Result Value Ref Range   ABO/RH(D) A POS    Antibody Screen NEG    Sample Expiration 05/02/2014   ABO/Rh  Result Value Ref Range   ABO/RH(D) A POS    Dg Chest 2 View 04/29/2014   CLINICAL DATA:  Shortness of breath. Hypotension. Left arm numbness and dizziness for 2 days. Blood in stools.  EXAM: CHEST  2 VIEW  COMPARISON:  07/08/2012 abdomen CT  FINDINGS: Thoracic spondylosis. Cardiac and mediastinal margins appear normal.  Oval-shaped will 1.1 by 0.4 cm density projects over the right anterior second rib. I do not definitively see this density on  the prior frontal  cervical spine radiograph of 01/09/2007.  No pleural effusion.  The lungs appear otherwise clear.  IMPRESSION: 1. Oval-shaped density projects over the right upper lobe. Although possibly a bony lesion of the right second rib, I do not see a bony lesion on the prior cervical spine radiograph from 01/09/2007. Accordingly, a neoplastic pulmonary lesion needs to be ruled out, and chest CT is recommended. 2. Thoracic spondylosis.   Electronically Signed   By: Gaylyn Rong M.D.   On: 04/29/2014 11:58   Results for ALFONSO, CARDEN (MRN [id number]) as of 04/29/2014 14:22  Ref. Range 07/08/2012 14:00 07/08/2012 14:16 04/29/2014 11:22  Hemoglobin Latest Range: 13.0-17.0 g/dL 40.9 81.1 9.8 (L)  HCT Latest Range: 39.0-52.0 % 38.4 (L) 41.0 29.9 (L)    1345:  IVF NS bolus given with SBP improving from 70's to 113.  IV protonix bolus and gtt started for likely UGIB. H/H lower than previous. Will T&S. Dx and testing d/w pt.  Questions answered.  Verb understanding, agreeable to admit. T/C to [facility]. Elvera Lennox, case discussed, including:  HPI, pertinent PM/SHx, VS/PE, dx testing, ED course and treatment:  Agreeable to admit, requests to write temporary orders, obtain tele bed to team [facility]. T/C to GI Dr. Elnoria Howard, case discussed, including:  HPI, pertinent PM/SHx, VS/PE, dx testing, ED course and treatment:  Agreeable to consult.    Samuel Jester, DO 04/30/14 1554

## 2014-04-29 NOTE — Consult Note (Signed)
Reason for Consult: Melena and Heme positive stool Referring Physician: [facility]  Kurt [name] HPI: This is a 63 year old male with a PMH of CHF, HTN, diverticulitis and TIA who presents to the ER with complaints of melena and progressive weakness.  Upon presentation his HGB was noted to have dropped from 13 down to 9.8.  For the past several days the patient noticed that he fatigued easily.  The day before yesterday he started to have an episode of black stool.  He was not sure if this was secondary to a particular PO intake.  Yesterday he had a larger black stool and he knew that he was bleeding.  The patient was not able to tolerate any minimal exertion, but he denied any chest pain.  No vomiting or nausea, but he complained of some GERD.  GERD is not a chronic problem for him.  On a routine basis since the early 2000 he takes ASA 325 mg for CVA prophylaxis.  He had a TIA in the past.  He takes Advil on a routine basis for arthritis issues secondary to a 35 foot fall many years ago.  Additionally, he reports having a colonoscopy 5 years ago and he was confirmed to have diverticula.  Past Medical History  Diagnosis Date  . Hypertension   . Diverticulitis   . TIA (transient ischemic attack) 2014  . CHF (congestive heart failure)     diastolic dysfunction  . Chronic back pain     Past Surgical History  Procedure Laterality Date  . Nasal septum surgery      No family history on file.  Social History:  reports that he has never smoked. He has never used smokeless tobacco. He reports that he drinks alcohol. He reports that he does not use illicit drugs.  Allergies: No Known Allergies  Medications:  Scheduled:  Continuous: . sodium chloride 100 mL/hr (04/29/14 1130)  . pantoprozole (PROTONIX) infusion 8 mg/hr (04/29/14 1400)    Results for orders placed or performed during the hospital encounter of 04/29/14 (from the past 24 hour(s))  POC occult blood, ED     Status: Abnormal    Collection Time: 04/29/14 11:09 AM  Result Value Ref Range   Fecal Occult Bld POSITIVE (A) NEGATIVE  Type and screen     Status: None   Collection Time: 04/29/14 11:22 AM  Result Value Ref Range   ABO/RH(D) A POS    Antibody Screen NEG    Sample Expiration 05/02/2014   Comprehensive metabolic panel     Status: Abnormal   Collection Time: 04/29/14 11:22 AM  Result Value Ref Range   Sodium 135 135 - 145 mmol/L   Potassium 4.6 3.5 - 5.1 mmol/L   Chloride 103 96 - 112 mmol/L   CO2 25 19 - 32 mmol/L   Glucose, Bld 117 (H) 70 - 99 mg/dL   BUN 72 (H) 6 - 23 mg/dL   Creatinine, Ser 1.45 (H) 0.50 - 1.35 mg/dL   Calcium 9.6 8.4 - 10.5 mg/dL   Total Protein 6.6 6.0 - 8.3 g/dL   Albumin 3.7 3.5 - 5.2 g/dL   AST 15 0 - 37 U/L   ALT 13 0 - 53 U/L   Alkaline Phosphatase 45 39 - 117 U/L   Total Bilirubin 0.7 0.3 - 1.2 mg/dL   GFR calc non Af Amer 49 (L) >90 mL/min   GFR calc Af Amer 57 (L) >90 mL/min   Anion gap 7 5 -   15  Troponin I     Status: None   Collection Time: 04/29/14 11:22 AM  Result Value Ref Range   Troponin I <0.03 <0.031 ng/mL  Protime-INR     Status: None   Collection Time: 04/29/14 11:22 AM  Result Value Ref Range   Prothrombin Time 14.4 11.6 - 15.2 seconds   INR 1.10 0.00 - 1.49  CBC with Differential     Status: Abnormal   Collection Time: 04/29/14 11:22 AM  Result Value Ref Range   WBC 10.6 (H) 4.0 - 10.5 K/uL   RBC 3.47 (L) 4.22 - 5.81 MIL/uL   Hemoglobin 9.8 (L) 13.0 - 17.0 g/dL   HCT 29.9 (L) 39.0 - 52.0 %   MCV 86.2 78.0 - 100.0 fL   MCH 28.2 26.0 - 34.0 pg   MCHC 32.8 30.0 - 36.0 g/dL   RDW 13.5 11.5 - 15.5 %   Platelets 276 150 - 400 K/uL   Neutrophils Relative % 78 (H) 43 - 77 %   Neutro Abs 8.4 (H) 1.7 - 7.7 K/uL   Lymphocytes Relative 11 (L) 12 - 46 %   Lymphs Abs 1.2 0.7 - 4.0 K/uL   Monocytes Relative 6 3 - 12 %   Monocytes Absolute 0.6 0.1 - 1.0 K/uL   Eosinophils Relative 4 0 - 5 %   Eosinophils Absolute 0.4 0.0 - 0.7 K/uL   Basophils  Relative 1 0 - 1 %   Basophils Absolute 0.1 0.0 - 0.1 K/uL  ABO/Rh     Status: None   Collection Time: 04/29/14 11:22 AM  Result Value Ref Range   ABO/RH(D) A POS      Dg Chest 2 View  04/29/2014   CLINICAL DATA:  Shortness of breath. Hypotension. Left arm numbness and dizziness for 2 days. Blood in stools.  EXAM: CHEST  2 VIEW  COMPARISON:  07/08/2012 abdomen CT  FINDINGS: Thoracic spondylosis. Cardiac and mediastinal margins appear normal.  Oval-shaped will 1.1 by 0.4 cm density projects over the right anterior second rib. I do not definitively see this density on the prior frontal cervical spine radiograph of 01/09/2007.  No pleural effusion.  The lungs appear otherwise clear.  IMPRESSION: 1. Oval-shaped density projects over the right upper lobe. Although possibly a bony lesion of the right second rib, I do not see a bony lesion on the prior cervical spine radiograph from 01/09/2007. Accordingly, a neoplastic pulmonary lesion needs to be ruled out, and chest CT is recommended. 2. Thoracic spondylosis.   Electronically Signed   By: Walter  Liebkemann M.D.   On: 04/29/2014 11:58    ROS:  As stated above in the HPI otherwise negative.  Blood pressure 101/68, pulse 97, temperature 97.7 F (36.5 C), temperature source Oral, resp. rate 14, height 5' 10" (1.778 m), weight 128.822 kg (284 lb), SpO2 99 %.    PE: Gen: NAD, Alert and Oriented HEENT:  [place]/AT, EOMI Neck: Supple, no LAD Lungs: CTA Bilaterally CV: RRR without M/G/R ABM: Soft, NTND, obese, +BS Ext: No C/C/E  Assessment/Plan: 1) Melena. 2) Anemia. 3) Heme positive stool.   The patient appears to have an upper GI bleed.  He has a history of NSAID use, which predisposes him to gastric erosions/ulcerations.  Plan: 1) EGD tomorrow. 2) Agree with Protonix. 3) Follow HGB and transfuse as necessary.  Eun Vermeer D 04/29/2014, 3:57 PM      

## 2014-04-30 ENCOUNTER — Encounter (HOSPITAL_COMMUNITY)
Admission: EM | Disposition: A | Discharge status: Home / Self Care | Payer: Self-pay | Source: Home / Self Care | Attending: Internal Medicine

## 2014-04-30 ENCOUNTER — Encounter (HOSPITAL_COMMUNITY): Payer: Self-pay | Admitting: Gastroenterology

## 2014-04-30 DIAGNOSIS — R0600 Dyspnea, unspecified: Secondary | ICD-10-CM

## 2014-04-30 DIAGNOSIS — D62 Acute posthemorrhagic anemia: Secondary | ICD-10-CM

## 2014-04-30 HISTORY — PX: ESOPHAGOGASTRODUODENOSCOPY: SHX5428

## 2014-04-30 LAB — HEMOGLOBIN AND HEMATOCRIT, BLOOD
HCT: 27.8 % — ABNORMAL LOW (ref 39.0–52.0)
Hemoglobin: 8.9 g/dL — ABNORMAL LOW (ref 13.0–17.0)

## 2014-04-30 LAB — BASIC METABOLIC PANEL
Anion gap: 9 (ref 5–15)
BUN: 62 mg/dL — AB (ref 6–23)
CO2: 22 mmol/L (ref 19–32)
Calcium: 8.6 mg/dL (ref 8.4–10.5)
Chloride: 105 mmol/L (ref 96–112)
Creatinine, Ser: 1.41 mg/dL — ABNORMAL HIGH (ref 0.50–1.35)
GFR calc Af Amer: 59 mL/min — ABNORMAL LOW (ref >90)
GFR calc non Af Amer: 51 mL/min — ABNORMAL LOW (ref >90)
Glucose, Bld: 102 mg/dL — ABNORMAL HIGH (ref 70–99)
POTASSIUM: 4.4 mmol/L (ref 3.5–5.1)
Sodium: 136 mmol/L (ref 135–145)

## 2014-04-30 SURGERY — EGD (ESOPHAGOGASTRODUODENOSCOPY)
Anesthesia: Moderate Sedation

## 2014-04-30 MED ORDER — SODIUM CHLORIDE 0.9 % IJ SOLN
INTRAMUSCULAR | Status: DC | PRN
  Administered 2014-04-30: 09:00:00

## 2014-04-30 MED ORDER — MIDAZOLAM HCL 5 MG/ML IJ SOLN
INTRAMUSCULAR | Status: AC
  Filled 2014-04-30: qty 1

## 2014-04-30 MED ORDER — MIDAZOLAM HCL 10 MG/2ML IJ SOLN
INTRAMUSCULAR | Status: DC | PRN
Start: 2014-04-30 — End: 2014-04-30
  Administered 2014-04-30: 1 mg via INTRAVENOUS
  Administered 2014-04-30 (×2): 2 mg via INTRAVENOUS

## 2014-04-30 MED ORDER — SODIUM CHLORIDE 0.9 % IV SOLN: INTRAVENOUS | Status: DC

## 2014-04-30 MED ORDER — FENTANYL CITRATE 0.05 MG/ML IJ SOLN
INTRAMUSCULAR | Status: DC | PRN
  Administered 2014-04-30 (×2): 25 ug via INTRAVENOUS

## 2014-04-30 MED ORDER — EPINEPHRINE HCL 0.1 MG/ML IJ SOSY
PREFILLED_SYRINGE | INTRAMUSCULAR | Status: AC
  Filled 2014-04-30: qty 10

## 2014-04-30 MED ORDER — FENTANYL CITRATE 0.05 MG/ML IJ SOLN
INTRAMUSCULAR | Status: AC
  Filled 2014-04-30: qty 2

## 2014-04-30 MED ORDER — ACETAMINOPHEN 325 MG PO TABS
650.0000 mg | ORAL_TABLET | Freq: Four times a day (QID) | ORAL | Status: DC | PRN
  Administered 2014-04-30 – 2014-05-01 (×2): 650 mg via ORAL
  Filled 2014-04-30 (×2): qty 2

## 2014-04-30 NOTE — Progress Notes (Signed)
[facility]                                                                              Patient Demographics  Kurt [name], is a 64 y.o. male, DOB - [birth year], BJY:[id number]  Admit date - 04/29/2014   Admitting Physician Costin Otelia SergeantM Gherghe, MD  Outpatient Primary MD for the patient is Kaleen MaskELKINS,WILSON OLIVER, MD  LOS - 1   Chief Complaint  Patient presents with  . Shortness of Breath  . Numbness  . Blood In Stools      HPI on 04/29/2014 by Dr. Pamella Pertostin Gherghe  Kurt [name] is a 64 y.o. male has a past medical history significant for Hypertension, diverticulitis, prior TIA in 2014 now on full dose aspirin presents to the ER with chief complaint of dark tarry stools for the past 2-3 days. He endorses generalized weakness, progressive dyspnea with activities for the same amount of time. He denies chest pain. He denies recent fever or chills, denies abdominal pain, nausea or vomiting. He first saw a dark stool about 3 days ago, progressively getting worse and today decided to come to the ED. This has never happened to him in the past. He also complains of left hand numbness, he was diagnosed with "nerve problems" and usually gets numbness in his first 2 fingers. He is also taking Advil every day for his back pain. In the ED, patient was orthostatic, mildly tachycardic, has AKI and a hemoglobin of 9.8 from baseline of 13. EDP discussed with Dr. Elnoria HowardHung from GI for consultation and TR[facility] was asked for admission.   Assessment & Plan   GI bleed/melena/Acute blood loss Anemia -Patient does use NSAIDs regularly and is on aspirin -Gastroenterology consulted and appreciated -EGD: Bleeding duodenal ulcer status post BICAP and hemoclipping; small nonbleeding antral ulcers.   -Recommendations to avoid NSAIDs, continue PPI daily, clearly liquid diet, follow hemoglobin -Hemoglobin currently stable at 8.9 (baseline 13)  Hypertension -Blood pressures have been soft -Home blood pressure medications  held  Acute kidney injury -Creatinine 2014 was 1 -Creatinine currently 1.41, possibly new baseline versus medications?  Right upper lobe lung density -Chest x-ray showed oval-shaped density projecting in the right upper lobe -Needs a nonemergent CT  Dyspnea on exertion  -Patient denies any further shortness of breath -Possibly related to anemia -Echocardiogram pending  Code Status: Full  Family Communication: None at bedside  Disposition Plan: Admitted.  Likely discharge 4/10 if stable  Time Spent in minutes   30 minutes  Procedures  EGD  Consults   Gastroenterology  DVT Prophylaxis  SCDs  Lab Results  Component Value Date   PLT 276 04/29/2014    Medications  Scheduled Meds:  Continuous Infusions: . sodium chloride 100 mL/hr at 04/30/14 0804  . pantoprozole (PROTONIX) infusion 8 mg/hr (04/30/14 0350)   PRN Meds:.  Antibiotics    Anti-infectives    None        Subjective:   Kurt [name] seen and examined today.  Patient denies any abdominal pain at this time. States his shortness of breath has improved. Denies any chest pain, dizziness, headache, nausea or vomiting. Has not noticed further dark stools.   Objective:  Filed Vitals:   04/30/14 0930 04/30/14 0935 04/30/14 0940 04/30/14 0946  BP: 122/59 123/60 118/68 124/71  Pulse: 98 93 96   Temp:      TempSrc:      Resp: Height:      Weight:      SpO2: 94% 98% 97%     Wt Readings from Last 3 Encounters:  04/29/14 128.822 kg (284 lb)  07/08/12 112.492 kg (248 lb)     Intake/Output Summary (Last 24 hours) at 04/30/14 1448 Last data filed at 04/30/14 0500  Gross per 24 hour  Intake 1523.33 ml  Output    600 ml  Net 923.33 ml    Exam  General: Well developed, well nourished, NAD, appears stated age  HEENT: NCAT, mucous membranes moist.   Cardiovascular: S1 S2 auscultated, no rubs, murmurs or gallops. Regular rate and rhythm.  Respiratory: Clear to auscultation  bilaterally with equal chest rise  Abdomen: Soft, obese, nontender, nondistended, + bowel sounds  Extremities: warm dry without cyanosis clubbing or edema  Neuro: AAOx3, nonfocal  Psych: Normal affect and demeanor with intact judgement and insight  Data Review   Micro Results No results found for this or any previous visit (from the past 240 hour(s)).  Radiology Reports Dg Chest 2 View  04/29/2014   CLINICAL DATA:  Shortness of breath. Hypotension. Left arm numbness and dizziness for 2 days. Blood in stools.  EXAM: CHEST  2 VIEW  COMPARISON:  07/08/2012 abdomen CT  FINDINGS: Thoracic spondylosis. Cardiac and mediastinal margins appear normal.  Oval-shaped will 1.1 by 0.4 cm density projects over the right anterior second rib. I do not definitively see this density on the prior frontal cervical spine radiograph of 01/09/2007.  No pleural effusion.  The lungs appear otherwise clear.  IMPRESSION: 1. Oval-shaped density projects over the right upper lobe. Although possibly a bony lesion of the right second rib, I do not see a bony lesion on the prior cervical spine radiograph from 01/09/2007. Accordingly, a neoplastic pulmonary lesion needs to be ruled out, and chest CT is recommended. 2. Thoracic spondylosis.   Electronically Signed   By: Gaylyn Rong M.D.   On: 04/29/2014 11:58    CBC  Recent Labs Lab 04/29/14 1122 04/29/14 1637 04/29/14 2249 04/30/14 1100  WBC 10.6*  --   --   --   HGB 9.8* 9.0* 8.2* 8.9*  HCT 29.9* 27.5* 25.4* 27.8*  PLT 276  --   --   --   MCV 86.2  --   --   --   MCH 28.2  --   --   --   MCHC 32.8  --   --   --   RDW 13.5  --   --   --   LYMPHSABS 1.2  --   --   --   MONOABS 0.6  --   --   --   EOSABS 0.4  --   --   --   BASOSABS 0.1  --   --   --     Chemistries   Recent Labs Lab 04/29/14 1122 04/30/14 0545  NA 135 136  K 4.6 4.4  CL 103 105  CO2 25 22  GLUCOSE 117* 102*  BUN 72* 62*  CREATININE 1.45* 1.41*  CALCIUM 9.6 8.6  AST 15  --    ALT 13  --   ALKPHOS 45  --   BILITOT 0.7  --    ------------------------------------------------------------------------------------------------------------------  estimated creatinine clearance is 70.4 mL/min (by C-G formula based on Cr of 1.41). ------------------------------------------------------------------------------------------------------------------ No results for input(s): HGBA1C in the last 72 hours. ------------------------------------------------------------------------------------------------------------------ No results for input(s): CHOL, HDL, LDLCALC, TRIG, CHOLHDL, LDLDIRECT in the last 72 hours. ------------------------------------------------------------------------------------------------------------------ No results for input(s): TSH, T4TOTAL, T3FREE, THYROIDAB in the last 72 hours.  Invalid input(s): FREET3 ------------------------------------------------------------------------------------------------------------------ No results for input(s): VITAMINB12, FOLATE, FERRITIN, TIBC, IRON, RETICCTPCT in the last 72 hours.  Coagulation profile  Recent Labs Lab 04/29/14 1122  INR 1.10    No results for input(s): DDIMER in the last 72 hours.  Cardiac Enzymes  Recent Labs Lab 04/29/14 1122  TROPONINI <0.03   ------------------------------------------------------------------------------------------------------------------ Invalid input(s): POCBNP    Jamiya Nims D.O. on 04/30/2014 at 2:48 PM  Between 7am to 7pm - Pager - [phone]  After 7pm go to [email or web address] - password [email or web address]  And look for the night coverage person covering for me after hours  [facility] Office  [phone]

## 2014-04-30 NOTE — H&P (View-Only) (Signed)
Reason for Consult: Melena and Heme positive stool Referring Physician: [facility]  Kurt [name] HPI: [name] with a PMH of CHF, HTN, diverticulitis and TIA who presents to the ER with complaints of melena and progressive weakness.  Upon presentation his HGB was noted to have dropped from 13 down to 9.8.  For the past several days the patient noticed that he fatigued easily.  The day before yesterday he started to have an episode of black stool.  He was not sure if this was secondary to a particular PO intake.  Yesterday he had a larger black stool and he knew that he was bleeding.  The patient was not able to tolerate any minimal exertion, but he denied any chest pain.  No vomiting or nausea, but he complained of some GERD.  GERD is not a chronic problem for him.  On a routine basis since the early 2000 he takes ASA 325 mg for CVA prophylaxis.  He had a TIA in the past.  He takes Advil on a routine basis for arthritis issues secondary to a 35 foot fall many years ago.  Additionally, he reports having a colonoscopy 5 years ago and he was confirmed to have diverticula.  Past Medical History  Diagnosis Date  . Hypertension   . Diverticulitis   . TIA (transient ischemic attack) 2014  . CHF (congestive heart failure)     diastolic dysfunction  . Chronic back pain     Past Surgical History  Procedure Laterality Date  . Nasal septum surgery      No family history on file.  Social History:  reports that he has never smoked. He has never used smokeless tobacco. He reports that he drinks alcohol. He reports that he does not use illicit drugs.  Allergies: No Known Allergies  Medications:  Scheduled:  Continuous: . sodium chloride 100 mL/hr (04/29/14 1130)  . pantoprozole (PROTONIX) infusion 8 mg/hr (04/29/14 1400)    Results for orders placed or performed during the hospital encounter of 04/29/14 (from the past 24 hour(s))  POC occult blood, ED     Status: Abnormal    Collection Time: 04/29/14 11:09 AM  Result Value Ref Range   Fecal Occult Bld POSITIVE (A) NEGATIVE  Type and screen     Status: None   Collection Time: 04/29/14 11:22 AM  Result Value Ref Range   ABO/RH(D) A POS    Antibody Screen NEG    Sample Expiration 05/02/2014   Comprehensive metabolic panel     Status: Abnormal   Collection Time: 04/29/14 11:22 AM  Result Value Ref Range   Sodium 135 135 - 145 mmol/L   Potassium 4.6 3.5 - 5.1 mmol/L   Chloride 103 96 - 112 mmol/L   CO2 25 19 - 32 mmol/L   Glucose, Bld 117 (H) 70 - 99 mg/dL   BUN 72 (H) 6 - 23 mg/dL   Creatinine, Ser 1.611.45 (H) 0.50 - 1.35 mg/dL   Calcium 9.6 8.4 - 09.610.5 mg/dL   Total Protein 6.6 6.0 - 8.3 g/dL   Albumin 3.7 3.5 - 5.2 g/dL   AST 15 0 - 37 U/L   ALT 13 0 - 53 U/L   Alkaline Phosphatase 45 39 - 117 U/L   Total Bilirubin 0.7 0.3 - 1.2 mg/dL   GFR calc non Af Amer 49 (L) >90 mL/min   GFR calc Af Amer 57 (L) >90 mL/min   Anion gap 7 5 -  15  Troponin I     Status: None   Collection Time: 04/29/14 11:22 AM  Result Value Ref Range   Troponin I <0.03 <0.031 ng/mL  Protime-INR     Status: None   Collection Time: 04/29/14 11:22 AM  Result Value Ref Range   Prothrombin Time 14.4 11.6 - 15.2 seconds   INR 1.10 0.00 - 1.49  CBC with Differential     Status: Abnormal   Collection Time: 04/29/14 11:22 AM  Result Value Ref Range   WBC 10.6 (H) 4.0 - 10.5 K/uL   RBC 3.47 (L) 4.22 - 5.81 MIL/uL   Hemoglobin 9.8 (L) 13.0 - 17.0 g/dL   HCT 16.1 (L) 09.6 - 04.5 %   MCV 86.2 78.0 - 100.0 fL   MCH 28.2 26.0 - 34.0 pg   MCHC 32.8 30.0 - 36.0 g/dL   RDW 40.9 81.1 - 91.4 %   Platelets 276 150 - 400 K/uL   Neutrophils Relative % 78 (H) 43 - 77 %   Neutro Abs 8.4 (H) 1.7 - 7.7 K/uL   Lymphocytes Relative 11 (L) 12 - 46 %   Lymphs Abs 1.2 0.7 - 4.0 K/uL   Monocytes Relative 6 3 - 12 %   Monocytes Absolute 0.6 0.1 - 1.0 K/uL   Eosinophils Relative 4 0 - 5 %   Eosinophils Absolute 0.4 0.0 - 0.7 K/uL   Basophils  Relative 1 0 - 1 %   Basophils Absolute 0.1 0.0 - 0.1 K/uL  ABO/Rh     Status: None   Collection Time: 04/29/14 11:22 AM  Result Value Ref Range   ABO/RH(D) A POS      Dg Chest 2 View  04/29/2014   CLINICAL DATA:  Shortness of breath. Hypotension. Left arm numbness and dizziness for 2 days. Blood in stools.  EXAM: CHEST  2 VIEW  COMPARISON:  07/08/2012 abdomen CT  FINDINGS: Thoracic spondylosis. Cardiac and mediastinal margins appear normal.  Oval-shaped will 1.1 by 0.4 cm density projects over the right anterior second rib. I do not definitively see this density on the prior frontal cervical spine radiograph of 01/09/2007.  No pleural effusion.  The lungs appear otherwise clear.  IMPRESSION: 1. Oval-shaped density projects over the right upper lobe. Although possibly a bony lesion of the right second rib, I do not see a bony lesion on the prior cervical spine radiograph from 01/09/2007. Accordingly, a neoplastic pulmonary lesion needs to be ruled out, and chest CT is recommended. 2. Thoracic spondylosis.   Electronically Signed   By: Kurt [name] M.D.   On: 04/29/2014 11:58    ROS:  As stated above in the HPI otherwise negative.  Blood pressure 101/68, pulse 97, temperature 97.7 F (36.5 C), temperature source Oral, resp. rate 14, height  (1.778 m), weight 128.822 kg (284 lb), SpO2 99 %.    PE: Gen: NAD, Alert and Oriented HEENT:  [place]/AT, EOMI Neck: Supple, no LAD Lungs: CTA Bilaterally CV: RRR without M/G/R ABM: Soft, NTND, obese, +BS Ext: No C/C/E  Assessment/Plan: 1) Melena. 2) Anemia. 3) Heme positive stool.   The patient appears to have an upper GI bleed.  He has a history of NSAID use, which predisposes him to gastric erosions/ulcerations.  Plan: 1) EGD tomorrow. 2) Agree with Protonix. 3) Follow HGB and transfuse as necessary.  Kurt [name] D 04/29/2014, 3:57 PM

## 2014-04-30 NOTE — Interval H&P Note (Signed)
History and Physical Interval Note:  04/30/2014 8:57 AM  Kurt [name]  has presented today for surgery, with the diagnosis of Melena and anemia  The various methods of treatment have been discussed with the patient and family. After consideration of risks, benefits and other options for treatment, the patient has consented to  Procedure(s): ESOPHAGOGASTRODUODENOSCOPY (EGD) (N/A) as a surgical intervention .  The patient's history has been reviewed, patient examined, no change in status, stable for surgery.  I have reviewed the patient's chart and labs.  Questions were answered to the patient's satisfaction.     [name]

## 2014-04-30 NOTE — Op Note (Signed)
[facility] [address] [place], [id number]   ENDOSCOPY PROCEDURE REPORT  PATIENT: Kurt [name], Kurt [name]  MR#: [id number] BIRTHDATE: 09-30-1950 , 63  yrs. old GENDER: male ENDOSCOPIST:Johnesha Acheampong Elnoria HowardHung, MD REFERRED BY: PROCEDURE DATE:  04/30/2014 PROCEDURE:   EGD w/ control of bleeding ASA CLASS:    Class III INDICATIONS: melena. MEDICATION: Versed 5 mg IV and Fentanyl 50 mcg IV TOPICAL ANESTHETIC:   none  DESCRIPTION OF PROCEDURE:   After the risks and benefits of the procedure were explained, informed consent was obtained.  The Pentax Gastroscope [id number]  endoscope was introduced through the mouth  and advanced to the second portion of the duodenum .  The instrument was slowly withdrawn as the mucosa was fully examined. Estimated blood loss is zero unless otherwise noted in this procedure report.   FINDINGS: The esophagus was normal.  No evidence of any inflammation.  In the gastric lumen fresh blood was identified. there was a pool of blood in the fundus and some blood at the pylorus.  These areas were suctioned and closely inspected and no active bleeding site was identified, however, in the antrum a couple of small clean-based ulcers were identified.  In the duodenal bulb a couple of small clean-based ulcers were found, however, at the junction between D1 and D2 a 5 mm bleeding ulcer was identified.  A total of 2.5 ml of 1:10,000 Epi was injected around the periphery.  Using a 7 Fr BICAP Gold Probe the ulcer was cauterized with two 5 second applications of cautery.  This seemed to markely slow down the bleeding, but there was still a trickle of blood.  Because the applications of the cautery was blind at one point secondary to a loss of positioning, I opted to place hemoclips.  The inital hemoclip was around the edge of the ulcer. The second hemoclip was not properly placed as positioning was lost.  The third hemoclip was able to close the ulcer.  No  further bleeding was identified after clip placement..    Retroflexed views revealed no abnormalities.    The scope was then withdrawn from the patient and the procedure completed.  COMPLICATIONS: There were no immediate complications.  ENDOSCOPIC IMPRESSION: 1) Bleeding duodenal ulcer s/p BICAP and hemoclipping. 2) Small nonbleeding antral ulcers.  RECOMMENDATIONS: 1) Follow HGB and transfuse if necessary. 2) Clear liquid diet. 3) PPI QD indefinitely. 4) Avoid NSAIDs. 5) If stable by tomorrow he can be discharged home with follow up in the office, which I will arrange.   _______________________________ eSigned:  Jeani HawkingPatrick Elio Haden, MD 04/30/2014 9:38 AM     cc:  CPT CODES: ICD CODES:  The ICD and CPT codes recommended by this software are interpretations from the data that the clinical staff has captured with the software.  The verification of the translation of this report to the ICD and CPT codes and modifiers is the sole responsibility of the health care institution and practicing physician where this report was generated.  [employer] will not be held responsible for the validity of the ICD and CPT codes included on this report.  AMA assumes no liability for data contained or not contained herein. CPT is a Publishing rights managerregistered trademark of the [employer].  PATIENT NAME:  Kurt [name], Kurt [name] MR#: [id number]

## 2014-05-01 LAB — CBC
HCT: 27 % — ABNORMAL LOW (ref 39.0–52.0)
HEMOGLOBIN: 8.7 g/dL — AB (ref 13.0–17.0)
MCH: 28.4 pg (ref 26.0–34.0)
MCHC: 32.2 g/dL (ref 30.0–36.0)
MCV: 88.2 fL (ref 78.0–100.0)
PLATELETS: 250 10*3/uL (ref 150–400)
RBC: 3.06 MIL/uL — ABNORMAL LOW (ref 4.22–5.81)
RDW: 13.9 % (ref 11.5–15.5)
WBC: 9.3 10*3/uL (ref 4.0–10.5)

## 2014-05-01 LAB — BASIC METABOLIC PANEL
Anion gap: 9 (ref 5–15)
BUN: 31 mg/dL — ABNORMAL HIGH (ref 6–23)
CALCIUM: 8.6 mg/dL (ref 8.4–10.5)
CO2: 23 mmol/L (ref 19–32)
Chloride: 104 mmol/L (ref 96–112)
Creatinine, Ser: 1.27 mg/dL (ref 0.50–1.35)
GFR calc non Af Amer: 58 mL/min — ABNORMAL LOW (ref >90)
GFR, EST AFRICAN AMERICAN: 67 mL/min — AB (ref >90)
GLUCOSE: 117 mg/dL — AB (ref 70–99)
POTASSIUM: 4 mmol/L (ref 3.5–5.1)
SODIUM: 136 mmol/L (ref 135–145)

## 2014-05-01 MED ORDER — PANTOPRAZOLE SODIUM 40 MG PO TBEC: 40.0000 mg | DELAYED_RELEASE_TABLET | Freq: Every day | ORAL | Status: DC

## 2014-05-01 MED ORDER — TRAMADOL HCL 50 MG PO TABS: 50.0000 mg | ORAL_TABLET | Freq: Four times a day (QID) | ORAL | Status: DC | PRN

## 2014-05-01 NOTE — Progress Notes (Signed)
Pt discharged to home.DC instructions reviewed and copy given.  FU appts reviewed and pt understands.  Rx's given and reviewed.

## 2014-05-01 NOTE — Discharge Instructions (Signed)
Nonsteroidal Anti-Inflammatory Medications  Nonsteroidal anti-inflammatory medications (NSAIDs) are a group of medicines often used for relief of pain and inflammation. These drugs include ibuprofen, aspirin, and naproxen. They are widely available in an over-the-counter form. The mechanism by which these drugs work in the body is not clearly understood. NSAIDs have many effects on the body, including pain relief, anti-inflammation, fever reduction, and reducing the blood's ability to clot. Most NSAIDs are taken orally in tablet form. Some may also be taken by injection in a vein (intravenously). WHY ATHLETES USE IT Many athletes use NSAIDs for their anti-inflammatory and pain reducing (analgesic) properties. Athletic participation frequently causes aches, pains, and inflammation, which these drugs can treat. There is also some evidence that they speed recovery after injury.  ADVERSE EFFECTS   Nausea.  Stomach pain.  Bleeding from the stomach and intestines.  Inflammation of the kidneys (nephritis).  Inflammation of the liver (hepatitis).  Headache.  Ringing in the ears (tinnitus).  Rash, with sun exposure (photosensitivity).  Increase in fluid volume (fluid retention).  Ulcers of the stomach and small intestine.  Kidney failure.  Liver failure.  Poor control of asthma.  Itching (urticaria).  Increase in nasal polyps (swelling).  Depression.  Loss of red blood cells (anemia).  Loose stools (diarrhea). PHARMACOLOGY  NSAIDs exist in both short-acting and long-acting forms. Many users of NSAIDs experience pain relief with initial doses, that becomes less effective with continual use. Most caregivers believe NSAIDs should be used for 2 to 3 weeks, before they are considered ineffective. Most NSAIDs are excreted from the body through the kidneys. The use of NSAIDs under conditions where dehydration can occur increases the risk of side effects to the kidneys and the liver. This is  especially common in older athletes and in athletes not acclimated to the heat. All these drugs are well absorbed when taken by mouth. The price of these drugs is variable. PREVENTION  It is recommend that NSAIDs be used after athletic participation to help recovery, or in the early stages of injury treatment. This is the time when athletes are trying to control pain and inflammation. Many athletes choose to take NSAIDs prophylactically (preventative, before injury). However, this may be associated with an increased risk of side effects. If you experience any side effects, including a decrease in performance while taking NSAIDs, discontinue use and consult your caregiver. If you choose to use NSAIDs regularly. for longer than 3 to 6 months, you should obtain screening blood tests for the liver, kidney, and bone marrow. Ongoing use may also increase your risk of a stomach ulcer. Document Released: 01/07/2005 Document Revised: 04/01/2011 Document Reviewed: 04/21/2008 [facility] Patient Information 2015 [place]. This information is not intended to replace advice given to you by your health care provider. Make sure you discuss any questions you have with your health care provider.  Gastrointestinal Bleeding Gastrointestinal (GI) bleeding means there is bleeding somewhere along the digestive tract, between the mouth and anus. CAUSES  There are many different problems that can cause GI bleeding. Possible causes include:  Esophagitis. This is inflammation, irritation, or swelling of the esophagus.  Hemorrhoids.These are veins that are full of blood (engorged) in the rectum. They cause pain, inflammation, and may bleed.  Anal fissures.These are areas of painful tearing which may bleed. They are often caused by passing hard stool.  Diverticulosis.These are pouches that form on the colon over time, with age, and may bleed significantly.  Diverticulitis.This is inflammation in areas with  diverticulosis. It  can cause pain, fever, and bloody stools, although bleeding is rare.  Polyps and cancer. Colon cancer often starts out as precancerous polyps.  Gastritis and ulcers.Bleeding from the upper gastrointestinal tract (near the stomach) may travel through the intestines and produce black, sometimes tarry, often bad smelling stools. In certain cases, if the bleeding is fast enough, the stools may not be black, but red. This condition may be life-threatening. SYMPTOMS   Vomiting bright red blood or material that looks like coffee grounds.  Bloody, black, or tarry stools. DIAGNOSIS  Your caregiver may diagnose your condition by taking your history and performing a physical exam. More tests may be needed, including:  X-rays and other imaging tests.  Esophagogastroduodenoscopy (EGD). This test uses a flexible, lighted tube to look at your esophagus, stomach, and small intestine.  Colonoscopy. This test uses a flexible, lighted tube to look at your colon. TREATMENT  Treatment depends on the cause of your bleeding.   For bleeding from the esophagus, stomach, small intestine, or colon, the caregiver doing your EGD or colonoscopy may be able to stop the bleeding as part of the procedure.  Inflammation or infection of the colon can be treated with medicines.  Many rectal problems can be treated with creams, suppositories, or warm baths.  Surgery is sometimes needed.  Blood transfusions are sometimes needed if you have lost a lot of blood. If bleeding is slow, you may be allowed to go home. If there is a lot of bleeding, you will need to stay in the hospital for observation. HOME CARE INSTRUCTIONS   Take any medicines exactly as prescribed.  Keep your stools soft by eating foods that are high in fiber. These foods include whole grains, legumes, fruits, and vegetables. Prunes (1 to 3 a day) work well for many people.  Drink enough fluids to keep your urine clear or pale  yellow. SEEK IMMEDIATE MEDICAL CARE IF:   Your bleeding increases.  You feel lightheaded, weak, or you faint.  You have severe cramps in your back or abdomen.  You pass large blood clots in your stool.  Your problems are getting worse. MAKE SURE YOU:   Understand these instructions.  Will watch your condition.  Will get help right away if you are not doing well or get worse. Document Released: 01/05/2000 Document Revised: 12/25/2011 Document Reviewed: 12/17/2010 [facility] Patient Information 2015 [place], [place]. This information is not intended to replace advice given to you by your health care provider. Make sure you discuss any questions you have with your health care provider.

## 2014-05-01 NOTE — Discharge Summary (Addendum)
Physician Discharge Summary  Kurt [name] KGM:[id number] DOB: 1950/10/30 DOA: 04/29/2014  PCP: Kaleen MaskELKINS,WILSON OLIVER, MD  Admit date: 04/29/2014 Discharge date: 05/01/2014  Time spent: 45 minutes  Recommendations for Outpatient Follow-up:  Patient will be discharged home. He is to follow-up with his primary care physician within one week of discharge. Patient should have a repeat CBC and BMP at that time.  Patient should discuss his blood pressure management as well as CT of the chest with his primary care physician. Patient should also follow-up with Dr. Elnoria HowardHung, gastroenterologist, in 2 weeks. Patient to continue his medications as prescribed. Patient should avoid NSAIDs and aspirin containing products. Patient should resume a heart healthy diet.   Discharge Diagnoses:  GI bleed/acute blood loss anemia Hypertension  Acute kidney injury Right upper lobe lung density Dyspnea on exertion  Discharge Condition: Stable  Diet recommendation: heart healthy  Filed Weights   04/29/14 1055  Weight: 128.822 kg (284 lb)    History of present illness:  on 04/29/2014 by Dr. Pamella Pertostin Gherghe  Kurt GlasgowFloyd N Sprinkle is a 64 y.o. male has a past medical history significant for Hypertension, diverticulitis, prior TIA in 2014 now on full dose aspirin presents to the ER with chief complaint of dark tarry stools for the past 2-3 days. He endorses generalized weakness, progressive dyspnea with activities for the same amount of time. He denies chest pain. He denies recent fever or chills, denies abdominal pain, nausea or vomiting. He first saw a dark stool about 3 days ago, progressively getting worse and today decided to come to the ED. This has never happened to him in the past. He also complains of left hand numbness, he was diagnosed with "nerve problems" and usually gets numbness in his first 2 fingers. He is also taking Advil every day for his back pain. In the ED, patient was orthostatic, mildly tachycardic, has AKI and  a hemoglobin of 9.8 from baseline of 13. EDP discussed with Dr. Elnoria HowardHung from GI for consultation and [facility] was asked for admission.   Hospital Course:  GI bleed/melena/Acute blood loss Anemia -Patient does use NSAIDs regularly and is on aspirin -Gastroenterology consulted and appreciated -EGD: Bleeding duodenal ulcer status post BICAP and hemoclipping; small nonbleeding antral ulcers.  -Recommendations to avoid NSAIDs, continue PPI daily, clearly liquid diet, follow hemoglobin -Hemoglobin currently stable at 8.7 (baseline 13) -Patient was able to tolerate clear liquid diet and was advanced to heart healthy diet. -Patient will need to follow-up with Dr. Elnoria HowardHung, in 2 weeks -Patient should have repeat CBC within 1 week of discharge  Hypertension -Blood pressures have been soft -Home blood pressure medications held,should be discussed with PCP restarting.  Acute kidney injury -Creatinine 2014 was 1 -Creatinine currently 1.27- improved from admission -possibly new baseline versus medications? -Should have repeat BMP within 1 week of discharge  Right upper lobe lung density -Chest x-ray showed oval-shaped density projecting in the right upper lobe -Needs a nonemergent CT, can be done as an outpatient  Dyspnea on exertion  -Patient denies any further shortness of breath -Possibly related to anemia -Echocardiogram EF of 60-65%, grade 1 diastolic dysfunction  Chronic back pain -Patient has been taking multiple NSAIDs for back pain. Refilled his tramadol. Patient was urged to not use NSAID containing medications.  Procedures: EGD Echocardiogram  Consultations: Gastroenterology, Dr. Elnoria HowardHung  Discharge Exam: Filed Vitals:   05/01/14 0450  BP: 111/58  Pulse: 91  Temp: 98.5 F (36.9 C)  Resp: 18   Exam  General: Well  developed, well nourished   HEENT: NCAT, mucous membranes moist.   Cardiovascular: S1 S2 auscultated, Regular rate and rhythm, no murmurs  Respiratory: Clear to  auscultation  Abdomen: Soft, obese, nontender, nondistended, + bowel sounds  Extremities: warm dry without cyanosis clubbing or edema  Neuro: AAOx3, nonfocal  Psych: Appropriate mood and affect  Discharge Instructions      Discharge Instructions    Discharge instructions    Complete by:  As directed   Patient will be discharged home. He is to follow-up with his primary care physician within one week of discharge. Patient should have a repeat CBC and BMP at that time.  Patient should discuss his blood pressure management as well as CT of the chest with his primary care physician. Patient should also follow-up with Dr. Elnoria Howard, gastroenterologist, in 2 weeks. Patient to continue his medications as prescribed. Patient should avoid NSAIDs and aspirin containing products. Patient should resume a heart healthy diet.            Medication List    STOP taking these medications        aspirin 325 MG tablet     ciprofloxacin 500 MG tablet  Commonly known as:  CIPRO     hydrochlorothiazide 50 MG tablet  Commonly known as:  HYDRODIURIL     ibuprofen 200 MG tablet  Commonly known as:  ADVIL,MOTRIN     lisinopril 20 MG tablet  Commonly known as:  PRINIVIL     metroNIDAZOLE 500 MG tablet  Commonly known as:  FLAGYL     naproxen sodium 220 MG tablet  Commonly known as:  ANAPROX     triamterene-hydrochlorothiazide 75-50 MG per tablet  Commonly known as:  MAXZIDE      TAKE these medications        pantoprazole 40 MG tablet  Commonly known as:  PROTONIX  Take 1 tablet (40 mg total) by mouth daily.     sertraline 100 MG tablet  Commonly known as:  ZOLOFT  Take 1 tablet (100 mg total) by mouth daily.     traMADol 50 MG tablet  Commonly known as:  ULTRAM  Take 1 tablet (50 mg total) by mouth every 6 (six) hours as needed (moderate pain).       No Known Allergies Follow-up Information    Follow up with Kaleen Mask, MD. Schedule an appointment as soon as possible for  a visit in 1 week.   Specialty:  Family Medicine   Why:  Hospital follow up, Blood pressure management, Repeat CT chest.   Contact information:   [address] [place] [address] [phone]       Follow up with HUNG,PATRICK D, MD. Schedule an appointment as soon as possible for a visit in 2 weeks.   Specialty:  Gastroenterology   Why:  Hospital follow up   Contact information:   [address] [place] [address] [phone]        The results of significant diagnostics from this hospitalization (including imaging, microbiology, ancillary and laboratory) are listed below for reference.    Significant Diagnostic Studies: Dg Chest 2 View  04/29/2014   CLINICAL DATA:  Shortness of breath. Hypotension. Left arm numbness and dizziness for 2 days. Blood in stools.  EXAM: CHEST  2 VIEW  COMPARISON:  07/08/2012 abdomen CT  FINDINGS: Thoracic spondylosis. Cardiac and mediastinal margins appear normal.  Oval-shaped will 1.1 by 0.4 cm density projects over the right anterior second rib. I do  not definitively see this density on the prior frontal cervical spine radiograph of 01/09/2007.  No pleural effusion.  The lungs appear otherwise clear.  IMPRESSION: 1. Oval-shaped density projects over the right upper lobe. Although possibly a bony lesion of the right second rib, I do not see a bony lesion on the prior cervical spine radiograph from 01/09/2007. Accordingly, a neoplastic pulmonary lesion needs to be ruled out, and chest CT is recommended. 2. Thoracic spondylosis.   Electronically Signed   By: Gaylyn Rong M.D.   On: 04/29/2014 11:58    Microbiology: No results found for this or any previous visit (from the past 240 hour(s)).   Labs: Basic Metabolic Panel:  Recent Labs Lab 04/29/14 1122 04/30/14 0545  NA 135 136  K 4.6 4.4  CL 103 105  CO2 25 22  GLUCOSE 117* 102*  BUN 72* 62*  CREATININE 1.45* 1.41*  CALCIUM 9.6 8.6   Liver Function  Tests:  Recent Labs Lab 04/29/14 1122  AST 15  ALT 13  ALKPHOS 45  BILITOT 0.7  PROT 6.6  ALBUMIN 3.7   No results for input(s): LIPASE, AMYLASE in the last 168 hours. No results for input(s): AMMONIA in the last 168 hours. CBC:  Recent Labs Lab 04/29/14 1122 04/29/14 1637 04/29/14 2249 04/30/14 1100 05/01/14 0846  WBC 10.6*  --   --   --  9.3  NEUTROABS 8.4*  --   --   --   --   HGB 9.8* 9.0* 8.2* 8.9* 8.7*  HCT 29.9* 27.5* 25.4* 27.8* 27.0*  MCV 86.2  --   --   --  88.2  PLT 276  --   --   --  250   Cardiac Enzymes:  Recent Labs Lab 04/29/14 1122  TROPONINI <0.03   BNP: BNP (last 3 results) No results for input(s): BNP in the last 8760 hours.  ProBNP (last 3 results) No results for input(s): PROBNP in the last 8760 hours.  CBG: No results for input(s): GLUCAP in the last 168 hours.     SignedEdsel Petrin  [facility] 05/01/2014, 11:01 AM

## 2014-05-01 NOTE — Progress Notes (Signed)
Subjective: No acute events.  Feeling well.  Objective: Vital signs in last 24 hours: Temp:  [98.3 F (36.8 C)-99 F (37.2 C)] 98.5 F (36.9 C) (04/10 0450) Pulse Rate:  [84-102] 91 (04/10 0450) Resp:  [12-20] 18 (04/10 0450) BP: (100-142)/(53-74) 111/58 mmHg (04/10 0450) SpO2:  [91 %-100 %] 98 % (04/10 0450) Last BM Date: 04/28/14  Intake/Output from previous day: 04/09 0701 - 04/10 0700 In: 1240 [P.O.:240; I.V.:1000] Out: 2575 [Urine:2575] Intake/Output this shift:    General appearance: alert and no distress GI: soft, non-tender; bowel sounds normal; no masses,  no organomegaly  Lab Results:  Recent Labs  04/29/14 1122 04/29/14 1637 04/29/14 2249 04/30/14 1100  WBC 10.6*  --   --   --   HGB 9.8* 9.0* 8.2* 8.9*  HCT 29.9* 27.5* 25.4* 27.8*  PLT 276  --   --   --    BMET  Recent Labs  04/29/14 1122 04/30/14 0545  NA 135 136  K 4.6 4.4  CL 103 105  CO2 25 22  GLUCOSE 117* 102*  BUN 72* 62*  CREATININE 1.45* 1.41*  CALCIUM 9.6 8.6   LFT  Recent Labs  04/29/14 1122  PROT 6.6  ALBUMIN 3.7  AST 15  ALT 13  ALKPHOS 45  BILITOT 0.7   PT/INR  Recent Labs  04/29/14 1122  LABPROT 14.4  INR 1.10   Hepatitis Panel No results for input(s): HEPBSAG, HCVAB, HEPAIGM, HEPBIGM in the last 72 hours. C-Diff No results for input(s): CDIFFTOX in the last 72 hours. Fecal Lactopherrin No results for input(s): FECLLACTOFRN in the last 72 hours.  Studies/Results: Dg Chest 2 View  04/29/2014   CLINICAL DATA:  Shortness of breath. Hypotension. Left arm numbness and dizziness for 2 days. Blood in stools.  EXAM: CHEST  2 VIEW  COMPARISON:  07/08/2012 abdomen CT  FINDINGS: Thoracic spondylosis. Cardiac and mediastinal margins appear normal.  Oval-shaped will 1.1 by 0.4 cm density projects over the right anterior second rib. I do not definitively see this density on the prior frontal cervical spine radiograph of 01/09/2007.  No pleural effusion.  The lungs appear  otherwise clear.  IMPRESSION: 1. Oval-shaped density projects over the right upper lobe. Although possibly a bony lesion of the right second rib, I do not see a bony lesion on the prior cervical spine radiograph from 01/09/2007. Accordingly, a neoplastic pulmonary lesion needs to be ruled out, and chest CT is recommended. 2. Thoracic spondylosis.   Electronically Signed   By: Gaylyn RongWalter  Liebkemann M.D.   On: 04/29/2014 11:58    Medications:  Scheduled:  Continuous: . sodium chloride 100 mL/hr at 05/01/14 0801  . pantoprozole (PROTONIX) infusion 8 mg/hr (05/01/14 0801)    Assessment/Plan: 1) Bleeding duodenal ulcer s/p cauterization and hemoclipping. 2) Anemia.   He is clinically stable.  HGB is pending for this AM.  If his HGB is stable he can be discharged home.  Plan: 1) Await HGB. 2) D/C home if HGB is stable. 3) Advance diet. 4) I will arrange for outpatient follow up in the next 2 weeks.   LOS: 2 days   Jaedan Huttner D 05/01/2014, 8:05 AM

## 2014-05-02 ENCOUNTER — Encounter (HOSPITAL_COMMUNITY): Payer: Self-pay | Admitting: Gastroenterology

## 2014-05-19 ENCOUNTER — Other Ambulatory Visit: Payer: Self-pay | Admitting: Family Medicine

## 2014-05-19 DIAGNOSIS — J984 Other disorders of lung: Secondary | ICD-10-CM

## 2014-05-19 DIAGNOSIS — R0600 Dyspnea, unspecified: Secondary | ICD-10-CM

## 2014-05-25 ENCOUNTER — Ambulatory Visit
Admission: RE | Admit: 2014-05-25 | Discharge: 2014-05-25 | Disposition: A | Discharge status: Home / Self Care | Payer: Medicare Other | Source: Ambulatory Visit | Attending: Family Medicine | Admitting: Family Medicine

## 2014-05-25 DIAGNOSIS — J984 Other disorders of lung: Secondary | ICD-10-CM

## 2014-05-25 DIAGNOSIS — R0600 Dyspnea, unspecified: Secondary | ICD-10-CM

## 2014-10-11 ENCOUNTER — Other Ambulatory Visit: Payer: Self-pay | Admitting: Family Medicine

## 2014-10-11 DIAGNOSIS — M545 Low back pain: Secondary | ICD-10-CM

## 2014-10-13 ENCOUNTER — Other Ambulatory Visit: Payer: Medicare Other

## 2014-10-19 ENCOUNTER — Other Ambulatory Visit: Payer: Medicare Other

## 2014-11-02 ENCOUNTER — Ambulatory Visit
Admission: RE | Admit: 2014-11-02 | Discharge: 2014-11-02 | Disposition: A | Discharge status: Home / Self Care | Payer: Medicare Other | Source: Ambulatory Visit | Attending: Family Medicine | Admitting: Family Medicine

## 2014-11-02 DIAGNOSIS — M545 Low back pain: Secondary | ICD-10-CM

## 2014-11-09 ENCOUNTER — Other Ambulatory Visit: Payer: Self-pay | Admitting: Neurosurgery

## 2014-11-09 DIAGNOSIS — M4804 Spinal stenosis, thoracic region: Secondary | ICD-10-CM

## 2014-11-21 ENCOUNTER — Ambulatory Visit
Admission: RE | Admit: 2014-11-21 | Discharge: 2014-11-21 | Disposition: A | Discharge status: Home / Self Care | Payer: Medicare Other | Source: Ambulatory Visit | Attending: Neurosurgery | Admitting: Neurosurgery

## 2014-11-21 DIAGNOSIS — M4804 Spinal stenosis, thoracic region: Secondary | ICD-10-CM

## 2015-02-09 ENCOUNTER — Other Ambulatory Visit (HOSPITAL_COMMUNITY): Payer: Self-pay | Admitting: Neurosurgery

## 2015-02-15 ENCOUNTER — Encounter (HOSPITAL_COMMUNITY)
Admission: RE | Admit: 2015-02-15 | Discharge: 2015-02-15 | Disposition: A | Discharge status: Home / Self Care | Payer: Medicare Other | Source: Ambulatory Visit | Attending: Neurosurgery | Admitting: Neurosurgery

## 2015-02-15 ENCOUNTER — Encounter (HOSPITAL_COMMUNITY): Payer: Self-pay

## 2015-02-15 HISTORY — DX: Unspecified osteoarthritis, unspecified site: M19.90

## 2015-02-15 HISTORY — DX: Post-traumatic stress disorder, unspecified: F43.10

## 2015-02-15 LAB — CBC WITH DIFFERENTIAL/PLATELET
Basophils Absolute: 0.1 10*3/uL (ref 0.0–0.1)
Basophils Relative: 1 %
Eosinophils Absolute: 0.5 10*3/uL (ref 0.0–0.7)
Eosinophils Relative: 5 %
HEMATOCRIT: 41.8 % (ref 39.0–52.0)
HEMOGLOBIN: 14 g/dL (ref 13.0–17.0)
Lymphocytes Relative: 15 %
Lymphs Abs: 1.4 10*3/uL (ref 0.7–4.0)
MCH: 29.7 pg (ref 26.0–34.0)
MCHC: 33.5 g/dL (ref 30.0–36.0)
MCV: 88.7 fL (ref 78.0–100.0)
MONOS PCT: 6 %
Monocytes Absolute: 0.6 10*3/uL (ref 0.1–1.0)
NEUTROS ABS: 6.7 10*3/uL (ref 1.7–7.7)
NEUTROS PCT: 73 %
Platelets: 254 10*3/uL (ref 150–400)
RBC: 4.71 MIL/uL (ref 4.22–5.81)
RDW: 13.3 % (ref 11.5–15.5)
WBC: 9.2 10*3/uL (ref 4.0–10.5)

## 2015-02-15 LAB — BASIC METABOLIC PANEL
ANION GAP: 12 (ref 5–15)
BUN: 28 mg/dL — ABNORMAL HIGH (ref 6–20)
CALCIUM: 9.5 mg/dL (ref 8.9–10.3)
CHLORIDE: 103 mmol/L (ref 101–111)
CO2: 22 mmol/L (ref 22–32)
Creatinine, Ser: 1.47 mg/dL — ABNORMAL HIGH (ref 0.61–1.24)
GFR calc non Af Amer: 48 mL/min — ABNORMAL LOW (ref >60)
GFR, EST AFRICAN AMERICAN: 56 mL/min — AB (ref >60)
Glucose, Bld: 125 mg/dL — ABNORMAL HIGH (ref 65–99)
Potassium: 4 mmol/L (ref 3.5–5.1)
SODIUM: 137 mmol/L (ref 135–145)

## 2015-02-15 LAB — SURGICAL PCR SCREEN
MRSA, PCR: NEGATIVE
STAPHYLOCOCCUS AUREUS: POSITIVE — AB

## 2015-02-15 MED ORDER — DEXTROSE 5 % IV SOLN
3.0000 g | INTRAVENOUS | Status: AC
  Administered 2015-02-16: 3 g via INTRAVENOUS
  Filled 2015-02-15: qty 3000

## 2015-02-15 MED ORDER — DEXAMETHASONE SODIUM PHOSPHATE 10 MG/ML IJ SOLN
10.0000 mg | INTRAMUSCULAR | Status: DC
  Filled 2015-02-15: qty 1

## 2015-02-15 NOTE — Progress Notes (Signed)
   02/15/15 1406  OBSTRUCTIVE SLEEP APNEA  Have you ever been diagnosed with sleep apnea through a sleep study? No  Do you snore loudly (loud enough to be heard through closed doors)?  0  Do you often feel tired, fatigued, or sleepy during the daytime (such as falling asleep during driving or talking to someone)? 0  Has anyone observed you stop breathing during your sleep? 0  Do you have, or are you being treated for high blood pressure? 1  BMI more than 35 kg/m2? 1  Age > 50 (1-yes) 1  Neck circumference greater than:Male 16 inches or larger, Male 17inches or larger? 1  Male Gender (Yes=1) 1  Obstructive Sleep Apnea Score 5  Score 5 or greater  Results sent to PCP

## 2015-02-15 NOTE — Pre-Procedure Instructions (Signed)
Kurt [name]  02/15/2015      [facility] - [place] ([place]), [place] - [address] [place] ([place]) [place] [address] Phone: [phone] Fax: [phone]    Your procedure is scheduled on  Thursday  02/16/15  Report to [facility] Admitting at 600 A.M.  Call this number if you have problems the morning of surgery:  [phone]   Remember:  Do not eat food or drink liquids after midnight.  Take these medicines the morning of surgery with A SIP OF WATER  Ranitidine (zantac) , sertraline (zoloft), tramadol if needed                                                                                                     (no aspirin, ibuprofen/ advil/ motrin, vitamins, herbal medicines, goody powders/ Bc's)   Do not wear jewelry, make-up or nail polish.  Do not wear lotions, powders, or perfumes.  You may wear deodorant.  Do not shave 48 hours prior to surgery.  Men may shave face and neck.  Do not bring valuables to the hospital.  [facility] is not responsible for any belongings or valuables.  Contacts, dentures or bridgework may not be worn into surgery.  Leave your suitcase in the car.  After surgery it may be brought to your room.  For patients admitted to the hospital, discharge time will be determined by your treatment team.  Patients discharged the day of surgery will not be allowed to drive home.   Name and phone number of your driver:    Special instructions:  [place] - Preparing for Surgery  Before surgery, you can play an important role.  Because skin is not sterile, your skin needs to be as free of germs as possible.  You can reduce the number of germs on you skin by washing with CHG (chlorahexidine gluconate) soap before surgery.  CHG is an antiseptic cleaner which kills germs and bonds with the skin to continue killing germs even after washing.  Please DO NOT use if you have an allergy to CHG or antibacterial soaps.  If your  skin becomes reddened/irritated stop using the CHG and inform your nurse when you arrive at Short Stay.  Do not shave (including legs and underarms) for at least 48 hours prior to the first CHG shower.  You may shave your face.  Please follow these instructions carefully:   1.  Shower with CHG Soap the night before surgery and the                                morning of Surgery.  2.  If you choose to wash your hair, wash your hair first as usual with your       normal shampoo.  3.  After you shampoo, rinse your hair and body thoroughly to remove the  Shampoo.  4.  Use CHG as you would any other liquid soap.  You can apply chg directly       to the skin and wash gently with scrungie or a clean washcloth.  5.  Apply the CHG Soap to your body ONLY FROM THE NECK DOWN.        Do not use on open wounds or open sores.  Avoid contact with your eyes,       ears, mouth and genitals (private parts).  Wash genitals (private parts)       with your normal soap.  6.  Wash thoroughly, paying special attention to the area where your surgery        will be performed.  7.  Thoroughly rinse your body with warm water from the neck down.  8.  DO NOT shower/wash with your normal soap after using and rinsing off       the CHG Soap.  9.  Pat yourself dry with a clean towel.            10.  Wear clean pajamas.            11.  Place clean sheets on your bed the night of your first shower and do not        sleep with pets.  Day of Surgery  Do not apply any lotions/deoderants the morning of surgery.  Please wear clean clothes to the hospital/surgery center.    Please read over the following fact sheets that you were given. Pain Booklet, Coughing and Deep Breathing, MRSA Information and Surgical Site Infection Prevention

## 2015-02-16 ENCOUNTER — Encounter (HOSPITAL_COMMUNITY)
Admission: RE | Disposition: A | Discharge status: Home / Self Care | Payer: Self-pay | Source: Ambulatory Visit | Attending: Neurosurgery

## 2015-02-16 ENCOUNTER — Encounter (HOSPITAL_COMMUNITY): Payer: Self-pay | Admitting: Certified Registered Nurse Anesthetist

## 2015-02-16 ENCOUNTER — Inpatient Hospital Stay (HOSPITAL_COMMUNITY): Payer: Medicare Other | Admitting: Certified Registered Nurse Anesthetist

## 2015-02-16 ENCOUNTER — Inpatient Hospital Stay (HOSPITAL_COMMUNITY)
Admission: RE | Admit: 2015-02-16 | Discharge: 2015-02-17 | DRG: 519 | Disposition: A | Discharge status: Home / Self Care | Payer: Medicare Other | Source: Ambulatory Visit | Attending: Neurosurgery | Admitting: Neurosurgery

## 2015-02-16 ENCOUNTER — Inpatient Hospital Stay (HOSPITAL_COMMUNITY): Payer: Medicare Other

## 2015-02-16 DIAGNOSIS — G8929 Other chronic pain: Secondary | ICD-10-CM | POA: Diagnosis present

## 2015-02-16 DIAGNOSIS — M4804 Spinal stenosis, thoracic region: Secondary | ICD-10-CM | POA: Diagnosis present

## 2015-02-16 DIAGNOSIS — Z8673 Personal history of transient ischemic attack (TIA), and cerebral infarction without residual deficits: Secondary | ICD-10-CM | POA: Diagnosis not present

## 2015-02-16 DIAGNOSIS — Z79891 Long term (current) use of opiate analgesic: Secondary | ICD-10-CM | POA: Diagnosis not present

## 2015-02-16 DIAGNOSIS — M1990 Unspecified osteoarthritis, unspecified site: Secondary | ICD-10-CM | POA: Diagnosis present

## 2015-02-16 DIAGNOSIS — I110 Hypertensive heart disease with heart failure: Secondary | ICD-10-CM | POA: Diagnosis present

## 2015-02-16 DIAGNOSIS — I5032 Chronic diastolic (congestive) heart failure: Secondary | ICD-10-CM | POA: Diagnosis present

## 2015-02-16 DIAGNOSIS — F4310 Post-traumatic stress disorder, unspecified: Secondary | ICD-10-CM | POA: Diagnosis present

## 2015-02-16 DIAGNOSIS — Z79899 Other long term (current) drug therapy: Secondary | ICD-10-CM | POA: Diagnosis not present

## 2015-02-16 DIAGNOSIS — Z419 Encounter for procedure for purposes other than remedying health state, unspecified: Secondary | ICD-10-CM

## 2015-02-16 DIAGNOSIS — G9529 Other cord compression: Secondary | ICD-10-CM | POA: Diagnosis present

## 2015-02-16 HISTORY — PX: LUMBAR LAMINECTOMY/DECOMPRESSION MICRODISCECTOMY: SHX5026

## 2015-02-16 SURGERY — LUMBAR LAMINECTOMY/DECOMPRESSION MICRODISCECTOMY
Anesthesia: General | Site: Back

## 2015-02-16 MED ORDER — ROCURONIUM BROMIDE 50 MG/5ML IV SOLN
INTRAVENOUS | Status: AC
  Filled 2015-02-16: qty 2

## 2015-02-16 MED ORDER — KETOROLAC TROMETHAMINE 30 MG/ML IJ SOLN
30.0000 mg | Freq: Four times a day (QID) | INTRAMUSCULAR | Status: DC
  Administered 2015-02-16: 30 mg via INTRAVENOUS
  Filled 2015-02-16: qty 1

## 2015-02-16 MED ORDER — LIDOCAINE HCL (CARDIAC) 20 MG/ML IV SOLN
INTRAVENOUS | Status: AC
  Filled 2015-02-16: qty 10

## 2015-02-16 MED ORDER — HEMOSTATIC AGENTS (NO CHARGE) OPTIME
TOPICAL | Status: DC | PRN
  Administered 2015-02-16: 1 via TOPICAL

## 2015-02-16 MED ORDER — CEFAZOLIN SODIUM 1-5 GM-% IV SOLN
1.0000 g | Freq: Three times a day (TID) | INTRAVENOUS | Status: AC
  Administered 2015-02-16 (×2): 1 g via INTRAVENOUS
  Filled 2015-02-16 (×2): qty 50

## 2015-02-16 MED ORDER — TRIAMTERENE-HCTZ 75-50 MG PO TABS
1.0000 | ORAL_TABLET | Freq: Every day | ORAL | Status: DC
  Administered 2015-02-16 – 2015-02-17 (×2): 1 via ORAL
  Filled 2015-02-16 (×2): qty 1

## 2015-02-16 MED ORDER — PHENYLEPHRINE HCL 10 MG/ML IJ SOLN
INTRAMUSCULAR | Status: DC | PRN
Start: 2015-02-16 — End: 2015-02-16
  Administered 2015-02-16: 200 ug via INTRAVENOUS
  Administered 2015-02-16: 40 ug via INTRAVENOUS
  Administered 2015-02-16: 80 ug via INTRAVENOUS
  Administered 2015-02-16 (×2): 200 ug via INTRAVENOUS

## 2015-02-16 MED ORDER — FENTANYL CITRATE (PF) 250 MCG/5ML IJ SOLN
INTRAMUSCULAR | Status: AC
  Filled 2015-02-16: qty 5

## 2015-02-16 MED ORDER — MIDAZOLAM HCL 2 MG/2ML IJ SOLN
INTRAMUSCULAR | Status: AC
  Filled 2015-02-16: qty 2

## 2015-02-16 MED ORDER — MENTHOL 3 MG MT LOZG: 1.0000 | LOZENGE | OROMUCOSAL | Status: DC | PRN

## 2015-02-16 MED ORDER — PHENOL 1.4 % MT LIQD: 1.0000 | OROMUCOSAL | Status: DC | PRN

## 2015-02-16 MED ORDER — THROMBIN 5000 UNITS EX SOLR
CUTANEOUS | Status: DC | PRN
  Administered 2015-02-16 (×2): 5000 [IU] via TOPICAL

## 2015-02-16 MED ORDER — ACETAMINOPHEN 325 MG PO TABS: 650.0000 mg | ORAL_TABLET | ORAL | Status: DC | PRN

## 2015-02-16 MED ORDER — LIDOCAINE HCL (CARDIAC) 20 MG/ML IV SOLN
INTRAVENOUS | Status: DC | PRN
  Administered 2015-02-16: 60 mg via INTRAVENOUS

## 2015-02-16 MED ORDER — SODIUM CHLORIDE 0.9 % IJ SOLN
INTRAMUSCULAR | Status: AC
  Filled 2015-02-16: qty 10

## 2015-02-16 MED ORDER — NEOSTIGMINE METHYLSULFATE 10 MG/10ML IV SOLN
INTRAVENOUS | Status: AC
  Filled 2015-02-16: qty 2

## 2015-02-16 MED ORDER — EPHEDRINE SULFATE 50 MG/ML IJ SOLN
INTRAMUSCULAR | Status: AC
  Filled 2015-02-16: qty 1

## 2015-02-16 MED ORDER — PHENYLEPHRINE 40 MCG/ML (10ML) SYRINGE FOR IV PUSH (FOR BLOOD PRESSURE SUPPORT)
PREFILLED_SYRINGE | INTRAVENOUS | Status: AC
  Filled 2015-02-16: qty 20

## 2015-02-16 MED ORDER — SODIUM CHLORIDE 0.9% FLUSH
3.0000 mL | Freq: Two times a day (BID) | INTRAVENOUS | Status: DC
  Administered 2015-02-16: 3 mL via INTRAVENOUS

## 2015-02-16 MED ORDER — VANCOMYCIN HCL 1000 MG IV SOLR
INTRAVENOUS | Status: AC
Start: 2015-02-16 — End: 2015-02-16
  Filled 2015-02-16: qty 1000

## 2015-02-16 MED ORDER — HYDROMORPHONE HCL 1 MG/ML IJ SOLN
0.2500 mg | INTRAMUSCULAR | Status: DC | PRN
  Administered 2015-02-16 (×2): 0.5 mg via INTRAVENOUS

## 2015-02-16 MED ORDER — THROMBIN 5000 UNITS EX SOLR
OROMUCOSAL | Status: DC | PRN
  Administered 2015-02-16: 09:00:00 via TOPICAL

## 2015-02-16 MED ORDER — MIDAZOLAM HCL 5 MG/5ML IJ SOLN
INTRAMUSCULAR | Status: DC | PRN
  Administered 2015-02-16: 2 mg via INTRAVENOUS

## 2015-02-16 MED ORDER — DEXTROSE 5 % IV SOLN
10.0000 mg | INTRAVENOUS | Status: DC | PRN
  Administered 2015-02-16: 100 ug/min via INTRAVENOUS

## 2015-02-16 MED ORDER — TAMSULOSIN HCL 0.4 MG PO CAPS
0.4000 mg | ORAL_CAPSULE | Freq: Every day | ORAL | Status: DC
  Administered 2015-02-16 – 2015-02-17 (×2): 0.4 mg via ORAL
  Filled 2015-02-16 (×2): qty 1

## 2015-02-16 MED ORDER — VANCOMYCIN HCL 1000 MG IV SOLR
INTRAVENOUS | Status: DC | PRN
  Administered 2015-02-16: 1000 mg via TOPICAL

## 2015-02-16 MED ORDER — ONDANSETRON HCL 4 MG/2ML IJ SOLN
INTRAMUSCULAR | Status: AC
  Filled 2015-02-16: qty 4

## 2015-02-16 MED ORDER — SERTRALINE HCL 50 MG PO TABS
100.0000 mg | ORAL_TABLET | Freq: Every day | ORAL | Status: DC
  Administered 2015-02-17: 100 mg via ORAL
  Filled 2015-02-16 (×2): qty 2

## 2015-02-16 MED ORDER — ONDANSETRON HCL 4 MG/2ML IJ SOLN
INTRAMUSCULAR | Status: DC | PRN
  Administered 2015-02-16: 4 mg via INTRAVENOUS

## 2015-02-16 MED ORDER — KETOROLAC TROMETHAMINE 15 MG/ML IJ SOLN
15.0000 mg | Freq: Four times a day (QID) | INTRAMUSCULAR | Status: DC
  Administered 2015-02-16 – 2015-02-17 (×2): 15 mg via INTRAVENOUS
  Filled 2015-02-16 (×2): qty 1

## 2015-02-16 MED ORDER — SODIUM CHLORIDE 0.9 % IV SOLN: 250.0000 mL | INTRAVENOUS | Status: DC

## 2015-02-16 MED ORDER — GLYCOPYRROLATE 0.2 MG/ML IJ SOLN
INTRAMUSCULAR | Status: DC | PRN
  Administered 2015-02-16: .8 mg via INTRAVENOUS

## 2015-02-16 MED ORDER — ALBUMIN HUMAN 5 % IV SOLN
INTRAVENOUS | Status: DC | PRN
  Administered 2015-02-16 (×4): via INTRAVENOUS

## 2015-02-16 MED ORDER — CYCLOBENZAPRINE HCL 10 MG PO TABS
10.0000 mg | ORAL_TABLET | Freq: Three times a day (TID) | ORAL | Status: DC | PRN
  Administered 2015-02-16: 10 mg via ORAL
  Filled 2015-02-16: qty 1

## 2015-02-16 MED ORDER — TRAMADOL HCL 50 MG PO TABS
50.0000 mg | ORAL_TABLET | Freq: Four times a day (QID) | ORAL | Status: DC | PRN
Start: 2015-02-16 — End: 2015-02-17

## 2015-02-16 MED ORDER — ONDANSETRON HCL 4 MG/2ML IJ SOLN: 4.0000 mg | INTRAMUSCULAR | Status: DC | PRN

## 2015-02-16 MED ORDER — LACTATED RINGERS IV SOLN
INTRAVENOUS | Status: DC
  Administered 2015-02-16 (×2): via INTRAVENOUS

## 2015-02-16 MED ORDER — ROCURONIUM BROMIDE 100 MG/10ML IV SOLN
INTRAVENOUS | Status: DC | PRN
  Administered 2015-02-16: 50 mg via INTRAVENOUS
  Administered 2015-02-16: 20 mg via INTRAVENOUS

## 2015-02-16 MED ORDER — PROPOFOL 10 MG/ML IV BOLUS
INTRAVENOUS | Status: DC | PRN
  Administered 2015-02-16: 160 mg via INTRAVENOUS

## 2015-02-16 MED ORDER — BUPIVACAINE HCL (PF) 0.25 % IJ SOLN
INTRAMUSCULAR | Status: DC | PRN
  Administered 2015-02-16: 20 mL

## 2015-02-16 MED ORDER — SODIUM CHLORIDE 0.9 % IR SOLN
Status: DC | PRN
  Administered 2015-02-16: 09:00:00

## 2015-02-16 MED ORDER — HYDROCODONE-ACETAMINOPHEN 5-325 MG PO TABS
1.0000 | ORAL_TABLET | ORAL | Status: DC | PRN
  Administered 2015-02-16: 2 via ORAL
  Filled 2015-02-16: qty 2

## 2015-02-16 MED ORDER — PROPOFOL 10 MG/ML IV BOLUS
INTRAVENOUS | Status: AC
  Filled 2015-02-16: qty 40

## 2015-02-16 MED ORDER — FENTANYL CITRATE (PF) 100 MCG/2ML IJ SOLN
INTRAMUSCULAR | Status: DC | PRN
  Administered 2015-02-16 (×2): 100 ug via INTRAVENOUS
  Administered 2015-02-16: 50 ug via INTRAVENOUS

## 2015-02-16 MED ORDER — MUPIROCIN 2 % EX OINT
1.0000 "application " | TOPICAL_OINTMENT | Freq: Once | CUTANEOUS | Status: AC
  Administered 2015-02-16: 1 via TOPICAL
  Filled 2015-02-16: qty 22

## 2015-02-16 MED ORDER — DEXAMETHASONE SODIUM PHOSPHATE 4 MG/ML IJ SOLN
INTRAMUSCULAR | Status: AC
  Filled 2015-02-16: qty 1

## 2015-02-16 MED ORDER — HYDROMORPHONE HCL 1 MG/ML IJ SOLN
INTRAMUSCULAR | Status: AC
  Administered 2015-02-16: 0.5 mg via INTRAVENOUS
  Filled 2015-02-16: qty 1

## 2015-02-16 MED ORDER — DEXAMETHASONE SODIUM PHOSPHATE 4 MG/ML IJ SOLN
INTRAMUSCULAR | Status: DC | PRN
  Administered 2015-02-16: 4 mg via INTRAVENOUS

## 2015-02-16 MED ORDER — OXYCODONE-ACETAMINOPHEN 5-325 MG PO TABS
1.0000 | ORAL_TABLET | ORAL | Status: DC | PRN
  Administered 2015-02-16 – 2015-02-17 (×4): 2 via ORAL
  Filled 2015-02-16 (×4): qty 2

## 2015-02-16 MED ORDER — FAMOTIDINE 20 MG PO TABS
20.0000 mg | ORAL_TABLET | Freq: Two times a day (BID) | ORAL | Status: DC
  Administered 2015-02-17: 20 mg via ORAL
  Filled 2015-02-16 (×3): qty 1

## 2015-02-16 MED ORDER — LISINOPRIL 20 MG PO TABS
20.0000 mg | ORAL_TABLET | Freq: Every day | ORAL | Status: DC
  Administered 2015-02-16 – 2015-02-17 (×2): 20 mg via ORAL
  Filled 2015-02-16 (×2): qty 1

## 2015-02-16 MED ORDER — GLYCOPYRROLATE 0.2 MG/ML IJ SOLN
INTRAMUSCULAR | Status: AC
  Filled 2015-02-16: qty 5

## 2015-02-16 MED ORDER — LACTATED RINGERS IV SOLN
INTRAVENOUS | Status: DC | PRN
  Administered 2015-02-16 (×2): via INTRAVENOUS

## 2015-02-16 MED ORDER — HYDROMORPHONE HCL 1 MG/ML IJ SOLN
0.5000 mg | INTRAMUSCULAR | Status: DC | PRN
  Administered 2015-02-16: 1 mg via INTRAVENOUS
  Filled 2015-02-16: qty 1

## 2015-02-16 MED ORDER — EPHEDRINE SULFATE 50 MG/ML IJ SOLN
INTRAMUSCULAR | Status: DC | PRN
  Administered 2015-02-16 (×4): 5 mg via INTRAVENOUS

## 2015-02-16 MED ORDER — SODIUM CHLORIDE 0.9% FLUSH: 3.0000 mL | INTRAVENOUS | Status: DC | PRN

## 2015-02-16 MED ORDER — ACETAMINOPHEN 650 MG RE SUPP: 650.0000 mg | RECTAL | Status: DC | PRN

## 2015-02-16 MED ORDER — NEOSTIGMINE METHYLSULFATE 10 MG/10ML IV SOLN
INTRAVENOUS | Status: DC | PRN
  Administered 2015-02-16: 5 mg via INTRAVENOUS

## 2015-02-16 MED ORDER — 0.9 % SODIUM CHLORIDE (POUR BTL) OPTIME
TOPICAL | Status: DC | PRN
  Administered 2015-02-16: 1000 mL

## 2015-02-16 MED ORDER — SUCCINYLCHOLINE CHLORIDE 20 MG/ML IJ SOLN
INTRAMUSCULAR | Status: AC
  Filled 2015-02-16: qty 1

## 2015-02-16 SURGICAL SUPPLY — 49 items
APL SKNCLS STERI-STRIP NONHPOA (GAUZE/BANDAGES/DRESSINGS) ×1
BAG DECANTER FOR FLEXI CONT (MISCELLANEOUS) ×3 IMPLANT
BENZOIN TINCTURE PRP APPL 2/3 (GAUZE/BANDAGES/DRESSINGS) ×3 IMPLANT
BLADE CLIPPER SURG (BLADE) IMPLANT
BRUSH SCRUB EZ PLAIN DRY (MISCELLANEOUS) ×3 IMPLANT
BUR CUTTER 7.0 ROUND (BURR) ×3 IMPLANT
CANISTER SUCT 3000ML PPV (MISCELLANEOUS) ×3 IMPLANT
CLOSURE WOUND 1/2 X4 (GAUZE/BANDAGES/DRESSINGS) ×1
DECANTER SPIKE VIAL GLASS SM (MISCELLANEOUS) ×3 IMPLANT
DRAPE LAPAROTOMY 100X72X124 (DRAPES) ×3 IMPLANT
DRAPE MICROSCOPE LEICA (MISCELLANEOUS) ×3 IMPLANT
DRAPE POUCH INSTRU U-SHP 10X18 (DRAPES) ×3 IMPLANT
DRAPE PROXIMA HALF (DRAPES) IMPLANT
DRAPE SURG 17X23 STRL (DRAPES) ×6 IMPLANT
DRSG OPSITE POSTOP 4X6 (GAUZE/BANDAGES/DRESSINGS) ×3 IMPLANT
ELECT BLADE 4.0 EZ CLEAN MEGAD (MISCELLANEOUS) ×3
ELECT REM PT RETURN 9FT ADLT (ELECTROSURGICAL) ×3
ELECTRODE BLDE 4.0 EZ CLN MEGD (MISCELLANEOUS) ×1 IMPLANT
ELECTRODE REM PT RTRN 9FT ADLT (ELECTROSURGICAL) ×1 IMPLANT
EVACUATOR 1/8 PVC DRAIN (DRAIN) ×3 IMPLANT
GAUZE SPONGE 4X4 12PLY STRL (GAUZE/BANDAGES/DRESSINGS) ×3 IMPLANT
GAUZE SPONGE 4X4 16PLY XRAY LF (GAUZE/BANDAGES/DRESSINGS) ×3 IMPLANT
GLOVE ECLIPSE 9.0 STRL (GLOVE) ×3 IMPLANT
GLOVE EXAM NITRILE LRG STRL (GLOVE) IMPLANT
GLOVE EXAM NITRILE MD LF STRL (GLOVE) IMPLANT
GLOVE EXAM NITRILE XL STR (GLOVE) IMPLANT
GLOVE EXAM NITRILE XS STR PU (GLOVE) IMPLANT
GOWN STRL REUS W/ TWL LRG LVL3 (GOWN DISPOSABLE) IMPLANT
GOWN STRL REUS W/ TWL XL LVL3 (GOWN DISPOSABLE) ×1 IMPLANT
GOWN STRL REUS W/TWL 2XL LVL3 (GOWN DISPOSABLE) IMPLANT
GOWN STRL REUS W/TWL LRG LVL3 (GOWN DISPOSABLE)
GOWN STRL REUS W/TWL XL LVL3 (GOWN DISPOSABLE) ×3
HEMOSTAT POWDER SURGIFOAM 1G (HEMOSTASIS) ×3 IMPLANT
KIT BASIN OR (CUSTOM PROCEDURE TRAY) ×3 IMPLANT
KIT ROOM TURNOVER OR (KITS) ×3 IMPLANT
LIQUID BAND (GAUZE/BANDAGES/DRESSINGS) ×3 IMPLANT
NEEDLE HYPO 22GX1.5 SAFETY (NEEDLE) ×3 IMPLANT
NEEDLE SPNL 22GX3.5 QUINCKE BK (NEEDLE) ×3 IMPLANT
NS IRRIG 1000ML POUR BTL (IV SOLUTION) ×3 IMPLANT
PACK LAMINECTOMY NEURO (CUSTOM PROCEDURE TRAY) ×3 IMPLANT
PAD ARMBOARD 7.5X6 YLW CONV (MISCELLANEOUS) ×9 IMPLANT
RUBBERBAND STERILE (MISCELLANEOUS) ×6 IMPLANT
SPONGE SURGIFOAM ABS GEL SZ50 (HEMOSTASIS) ×3 IMPLANT
STRIP CLOSURE SKIN 1/2X4 (GAUZE/BANDAGES/DRESSINGS) ×2 IMPLANT
SUT VIC AB 2-0 CT1 18 (SUTURE) ×6 IMPLANT
SUT VIC AB 3-0 SH 8-18 (SUTURE) ×3 IMPLANT
TOWEL OR 17X24 6PK STRL BLUE (TOWEL DISPOSABLE) ×3 IMPLANT
TOWEL OR 17X26 10 PK STRL BLUE (TOWEL DISPOSABLE) ×3 IMPLANT
WATER STERILE IRR 1000ML POUR (IV SOLUTION) ×3 IMPLANT

## 2015-02-16 NOTE — Progress Notes (Signed)
Utilization review completed.  

## 2015-02-16 NOTE — Transfer of Care (Signed)
Immediate Anesthesia Transfer of Care Note  Patient: Kurt [name]  Procedure(s) Performed: Procedure(s): Thoracic ten - thoracic twelve laminectomy (N/A)  Patient Location: PACU  Anesthesia Type:General  Level of Consciousness: awake, alert  and oriented  Airway & Oxygen Therapy: Patient Spontanous Breathing and Patient connected to face mask oxygen  Post-op Assessment: Report given to RN and Post -op Vital signs reviewed and stable  Post vital signs: Reviewed and stable  Last Vitals:  Filed Vitals:   02/16/15 0700  BP: 126/74  Pulse: 85  Temp: 37.1 C  Resp: 20    Complications: No apparent anesthesia complications   Dr. Randa Evens made of aware of continued low BP issues.

## 2015-02-16 NOTE — H&P (Signed)
Kurt [name] is an 65 y.o. male.   Chief Complaint: Back and bilateral leg pain HPI: 65 year old male with back and bilateral lower extremity pain and paresthesias and weakness consistent with a lower thoracic myelopathy. Workup demonstrates evidence of marked lower thoracic stenosis at T10 and T11 and T12 with significant spinal cord compression. Patient resents now for decompressive surgery.  Past Medical History  Diagnosis Date  . Hypertension   . Diverticulitis   . TIA (transient ischemic attack) 2014    patient denies states was [place]  . CHF (congestive heart failure) (HCC)     diastolic dysfunction  . Chronic back pain   . PTSD (post-traumatic stress disorder)     after  a Fall   . Arthritis     Past Surgical History  Procedure Laterality Date  . Nasal septum surgery    . Esophagogastroduodenoscopy N/A 04/30/2014    Procedure: ESOPHAGOGASTRODUODENOSCOPY (EGD);  Surgeon: Carol Ada, MD;  Location: [facility] ENDOSCOPY;  Service: Endoscopy;  Laterality: N/A;    History reviewed. No pertinent family history. Social History:  reports that he has never smoked. He has never used smokeless tobacco. He reports that he drinks alcohol. He reports that he does not use illicit drugs.  Allergies: No Known Allergies  Medications Prior to Admission  Medication Sig Dispense Refill  . lisinopril (PRINIVIL,ZESTRIL) 20 MG tablet Take 20 mg by mouth daily.  0  . ranitidine (ZANTAC) 150 MG tablet Take 150 mg by mouth 2 (two) times daily.  0  . sertraline (ZOLOFT) 100 MG tablet Take 1 tablet (100 mg total) by mouth daily. 90 tablet 3  . traMADol (ULTRAM) 50 MG tablet Take 1 tablet (50 mg total) by mouth every 6 (six) hours as needed (moderate pain). (Patient taking differently: Take 50 mg by mouth every 6 (six) hours as needed for moderate pain. ) 30 tablet 0  . triamterene-hydrochlorothiazide (MAXZIDE) 75-50 MG tablet Take 1 tablet by mouth daily.  0    Results for orders placed or performed  during the hospital encounter of 02/15/15 (from the past 48 hour(s))  Basic metabolic panel     Status: Abnormal   Collection Time: 02/15/15  2:27 PM  Result Value Ref Range   Sodium 137 135 - 145 mmol/L   Potassium 4.0 3.5 - 5.1 mmol/L   Chloride 103 101 - 111 mmol/L   CO2 22 22 - 32 mmol/L   Glucose, Bld 125 (H) 65 - 99 mg/dL   BUN 28 (H) 6 - 20 mg/dL   Creatinine, Ser 1.47 (H) 0.61 - 1.24 mg/dL   Calcium 9.5 8.9 - 10.3 mg/dL   GFR calc non Af Amer 48 (L) >60 mL/min   GFR calc Af Amer 56 (L) >60 mL/min    Comment: (NOTE) The eGFR has been calculated using the CKD EPI equation. This calculation has not been validated in all clinical situations. eGFR's persistently <60 mL/min signify possible Chronic Kidney Disease.    Anion gap 12 5 - 15  CBC WITH DIFFERENTIAL     Status: None   Collection Time: 02/15/15  2:27 PM  Result Value Ref Range   WBC 9.2 4.0 - 10.5 K/uL   RBC 4.71 4.22 - 5.81 MIL/uL   Hemoglobin 14.0 13.0 - 17.0 g/dL   HCT 41.8 39.0 - 52.0 %   MCV 88.7 78.0 - 100.0 fL   MCH 29.7 26.0 - 34.0 pg   MCHC 33.5 30.0 - 36.0 g/dL   RDW 13.3 11.5 -  15.5 %   Platelets 254 150 - 400 K/uL   Neutrophils Relative % 73 %   Neutro Abs 6.7 1.7 - 7.7 K/uL   Lymphocytes Relative 15 %   Lymphs Abs 1.4 0.7 - 4.0 K/uL   Monocytes Relative 6 %   Monocytes Absolute 0.6 0.1 - 1.0 K/uL   Eosinophils Relative 5 %   Eosinophils Absolute 0.5 0.0 - 0.7 K/uL   Basophils Relative 1 %   Basophils Absolute 0.1 0.0 - 0.1 K/uL  Surgical pcr screen     Status: Abnormal   Collection Time: 02/15/15  2:27 PM  Result Value Ref Range   MRSA, PCR NEGATIVE NEGATIVE   Staphylococcus aureus POSITIVE (A) NEGATIVE    Comment:        The Xpert SA Assay (FDA approved for NASAL specimens in patients over 73 years of age), is one component of a comprehensive surveillance program.  Test performance has been validated by [facility] for patients greater than or equal to 21 year old. It is not  intended to diagnose infection nor to guide or monitor treatment.    No results found.  Pertinent items noted in HPI and remainder of comprehensive ROS otherwise negative.  Blood pressure 126/74, pulse 85, temperature 98.7 F (37.1 C), temperature source Oral, resp. rate 20, height 5' 10.5" (1.791 m), weight 125.646 kg (277 lb), SpO2 95 %.  The patient is awake and alert. He is oriented and appropriate. His speech is fluent. His judgment and insight are intact. Examination HEENT is unremarkable. Chest and abdomen are benign. Extremities are free from injury or deformity. Neurologically he is awake and alert. Oriented and appropriate. Cranial nerve function intact. Motor examination 5/5 bilateral upper extremities 4/5 distal lower extremities bilaterally. Sensory examination reveals decreased sensation to light touch from L1 distally. Reflexes are hypoactive. No evidence of long tract signs. Gait is somewhat slow and shuffling. Posture is mildly flexed. Assessment/Plan T10-T12 stenosis with myelopathy. Plan T10-T12 decompressive laminectomy. Risks and benefits of explained. Patient wishes to proceed.  Lynne Takemoto A 02/16/2015, 7:37 AM

## 2015-02-16 NOTE — Anesthesia Preprocedure Evaluation (Addendum)
Anesthesia Evaluation  Patient identified by MRN, date of birth, ID band Patient awake    Airway Mallampati: II  TM Distance: >3 FB Neck ROM: Full    Dental  (+) Teeth Intact, Dental Advisory Given,    Pulmonary neg pulmonary ROS,    breath sounds clear to auscultation       Cardiovascular hypertension, +CHF   Rhythm:Regular Rate:Normal     Neuro/Psych    GI/Hepatic negative GI ROS, Neg liver ROS,   Endo/Other  negative endocrine ROS  Renal/GU Renal disease     Musculoskeletal   Abdominal   Peds  Hematology   Anesthesia Other Findings   Reproductive/Obstetrics                          Anesthesia Physical Anesthesia Plan  ASA: III  Anesthesia Plan: General   Post-op Pain Management:    Induction: Intravenous  Airway Management Planned: Oral ETT  Additional Equipment:   Intra-op Plan:   Post-operative Plan: Extubation in OR  Informed Consent: I have reviewed the patients History and Physical, chart, labs and discussed the procedure including the risks, benefits and alternatives for the proposed anesthesia with the patient or authorized representative who has indicated his/her understanding and acceptance.   Dental advisory given  Plan Discussed with: CRNA and Anesthesiologist  Anesthesia Plan Comments:         Anesthesia Quick Evaluation

## 2015-02-16 NOTE — Anesthesia Postprocedure Evaluation (Signed)
Anesthesia Post Note  Patient: Kurt [name]  Procedure(s) Performed: Procedure(s) (LRB): Thoracic ten - thoracic twelve laminectomy (N/A)  Patient location during evaluation: PACU Anesthesia Type: General Level of consciousness: awake Pain management: pain level controlled Vital Signs Assessment: post-procedure vital signs reviewed and stable Respiratory status: spontaneous breathing Cardiovascular status: stable Anesthetic complications: no    Last Vitals:  Filed Vitals:   02/16/15 1124 02/16/15 1613  BP: 105/95 124/92  Pulse: 105 113  Temp: 36.8 C 37 C  Resp: 18 18    Last Pain:  Filed Vitals:   02/16/15 1614  PainSc: Asleep                 EDWARDS,Josslyn Ciolek

## 2015-02-16 NOTE — Op Note (Signed)
Date of procedure: 02/16/2015  Date of dictation: Same  Service: Neurosurgery  Preoperative diagnosis: T10 T11 T12 stenosis with myelopathy  Postoperative diagnosis: Same  Procedure Name: T10 T11 T12 decompressive laminectomies  Surgeon:Markus Casten A.Mercadies Co, M.D.  Asst. Surgeon: Venetia Maxon  Anesthesia: General  Indication: 65 year old male with bilateral lower extremity weakness and sensory loss consistent with a lower thoracic myelopathy. Workup demonstrates evidence of marked lower thoracic stenosis with spinal cord compression and spinal cord signal change. Patient presents now for decompressive surgery.  Operative note: After induction of anesthesia, patient positioned prone on T Wilson frame and appropriately padded. Thoracic and lumbar region prepped and draped sterilely. Incision made overlying T10-T12. Dissection performed bilaterally. Retractor placed. X-rays taken. Level was confirmed. Decompressive laminectomies then performed using Leksell rongeurs Kerrison rongeurs the high-speed drill to remove the entire lamina of T10-T11 and T12 as well as the medial aspect of the T11-12 and T10-11 facet joints. Lateral gutters were decompressed. No injury of to the thecal sac or nerve roots. Wound is then irrigated with family solution. Gelfoam was replaced around the periphery of the decompression for hemostasis which was found to be good. Vancomycin powder was left in the deep wound space. Wounds and closed in layers with Vicryl sutures. A medium Hemovac drain was left in the deep wound space. Steri-Strips and sterile dressing were applied. No apparent complications. Patient tolerated the procedure well and he returns to the recovery room postoperatively

## 2015-02-16 NOTE — Anesthesia Procedure Notes (Signed)
Procedure Name: Intubation Date/Time: 02/16/2015 8:42 AM Performed by: Daiva Eves Pre-anesthesia Checklist: Patient identified, Timeout performed, Emergency Drugs available, Suction available and Patient being monitored Patient Re-evaluated:Patient Re-evaluated prior to inductionOxygen Delivery Method: Circle system utilized Preoxygenation: Pre-oxygenation with 100% oxygen Intubation Type: IV induction Ventilation: Mask ventilation without difficulty and Oral airway inserted - appropriate to patient size Laryngoscope Size: Mac and 4 Grade View: Grade II Tube type: Oral Tube size: 7.5 mm Number of attempts: 1 Airway Equipment and Method: Stylet Placement Confirmation: ETT inserted through vocal cords under direct vision,  breath sounds checked- equal and bilateral,  positive ETCO2 and CO2 detector Secured at: 22 cm Tube secured with: Tape Dental Injury: Teeth and Oropharynx as per pre-operative assessment  Comments: arytenoid view only with cricoid pressure

## 2015-02-16 NOTE — Brief Op Note (Signed)
02/16/2015  9:42 AM  PATIENT:  Kurt [name]  65 y.o. male  PRE-OPERATIVE DIAGNOSIS:  Stenosis  POST-OPERATIVE DIAGNOSIS:  Stenosis  PROCEDURE:  Procedure(s): Thoracic ten - thoracic twelve laminectomy (N/A)  SURGEON:  Surgeon(s) and Role:    * Julio Sicks, MD - Primary    * Maeola Harman, MD - Assisting  PHYSICIAN ASSISTANT:   ASSISTANTS:    ANESTHESIA:   general  EBL:  Total I/O In: 2450 [I.V.:1950; IV Piggyback:500] Out: 550 [Blood:550]  BLOOD ADMINISTERED:none  DRAINS: none   LOCAL MEDICATIONS USED:  MARCAINE     SPECIMEN:  No Specimen  DISPOSITION OF SPECIMEN:  N/A  COUNTS:  YES  TOURNIQUET:  * No tourniquets in log *  DICTATION: .Dragon Dictation  PLAN OF CARE: Admit to inpatient   PATIENT DISPOSITION:  PACU - hemodynamically stable.   Delay start of Pharmacological VTE agent (>24hrs) due to surgical blood loss or risk of bleeding: yes

## 2015-02-17 ENCOUNTER — Encounter (HOSPITAL_COMMUNITY): Payer: Self-pay | Admitting: Neurosurgery

## 2015-02-17 MED ORDER — OXYCODONE-ACETAMINOPHEN 5-325 MG PO TABS: 1.0000 | ORAL_TABLET | ORAL | Status: DC | PRN

## 2015-02-17 MED ORDER — CYCLOBENZAPRINE HCL 10 MG PO TABS: 10.0000 mg | ORAL_TABLET | Freq: Three times a day (TID) | ORAL | Status: DC | PRN

## 2015-02-17 NOTE — Discharge Summary (Signed)
Physician Discharge Summary  Patient ID: Kurt [name] MRN: [id number] DOB/AGE: 1950/06/29 65 y.o.  Admit date: 02/16/2015 Discharge date: 02/17/2015  Admission Diagnoses:  Discharge Diagnoses:  Principal Problem:   Spinal stenosis, thoracic Active Problems:   Thoracic stenosis   Discharged Condition: good  Hospital Course: Patient admitted to the hospital where he underwent an uncomplicated lower thoracic decompression. Postoperatively he is doing well. Lower extremities feel better. Standing and walking well. Voiding without difficulty. Plan for discharge home.  Consults:   Significant Diagnostic Studies:   Treatments:   Discharge Exam: Blood pressure 94/59, pulse 78, temperature 97.6 F (36.4 C), temperature source Oral, resp. rate 20, height 5' 10.5" (1.791 m), weight 125.646 kg (277 lb), SpO2 95 %. Awake and alert. Oriented and appropriate. Cranial nerve function intact. Motor and sensory function extremities normal. Wound clean and dry. Chest and abdomen benign.  Disposition: 01-Home or Self Care     Medication List    TAKE these medications        cyclobenzaprine 10 MG tablet  Commonly known as:  FLEXERIL  Take 1 tablet (10 mg total) by mouth 3 (three) times daily as needed for muscle spasms.     lisinopril 20 MG tablet  Commonly known as:  PRINIVIL,ZESTRIL  Take 20 mg by mouth daily.     oxyCODONE-acetaminophen 5-325 MG tablet  Commonly known as:  PERCOCET/ROXICET  Take 1-2 tablets by mouth every 4 (four) hours as needed for moderate pain.     ranitidine 150 MG tablet  Commonly known as:  ZANTAC  Take 150 mg by mouth 2 (two) times daily.     sertraline 100 MG tablet  Commonly known as:  ZOLOFT  Take 1 tablet (100 mg total) by mouth daily.     traMADol 50 MG tablet  Commonly known as:  ULTRAM  Take 1 tablet (50 mg total) by mouth every 6 (six) hours as needed (moderate pain).     triamterene-hydrochlorothiazide 75-50 MG tablet  Commonly known as:   MAXZIDE  Take 1 tablet by mouth daily.           Follow-up Information    Schedule an appointment as soon as possible for a visit with Temple Pacini, MD.   Specialty:  Neurosurgery   Contact information:   [address] [place] [address] [phone]       Signed: Temple Pacini 02/17/2015, 7:53 AM

## 2015-02-17 NOTE — Progress Notes (Signed)
Pt doing well. Pt given D/C instructions with Rx's, verbal understanding was provided. Pt's incision is covered with Honeycomb dressing and it has no sign of infection. Pt's IV and Hemovac were removed prior to D/C per MD order. Pt received RW from [facility] for home use. Pt D/C'd home via wheelchair @ 1215 per MD order. Pt is stable @ D/C and has no other needs at this time. Rema Fendt, RN

## 2015-02-17 NOTE — Discharge Instructions (Signed)

## 2015-06-22 ENCOUNTER — Other Ambulatory Visit: Payer: Self-pay | Admitting: Neurosurgery

## 2015-06-22 DIAGNOSIS — M5416 Radiculopathy, lumbar region: Secondary | ICD-10-CM

## 2015-06-30 ENCOUNTER — Ambulatory Visit
Admission: RE | Admit: 2015-06-30 | Discharge: 2015-06-30 | Disposition: A | Discharge status: Home / Self Care | Payer: Medicare Other | Source: Ambulatory Visit | Attending: Neurosurgery | Admitting: Neurosurgery

## 2015-06-30 DIAGNOSIS — M5416 Radiculopathy, lumbar region: Secondary | ICD-10-CM

## 2015-06-30 MED ORDER — GADOBENATE DIMEGLUMINE 529 MG/ML IV SOLN
10.0000 mL | Freq: Once | INTRAVENOUS | Status: AC | PRN
  Administered 2015-06-30: 10 mL via INTRAVENOUS

## 2016-03-06 ENCOUNTER — Other Ambulatory Visit: Payer: Self-pay | Admitting: Neurosurgery

## 2016-03-13 NOTE — Pre-Procedure Instructions (Signed)
Luna GlasgowFloyd N Terra  03/13/2016      [facility] [address] [address] - [address] [place] [address] Phone: [phone] Fax: [phone]    Your procedure is scheduled on March 2  [facility] at 1000 A.M.  Call this number if you have problems the morning of surgery:  [phone]   Remember:  Do not eat food or drink liquids after midnight.   Take these medicines the morning of surgery with A SIP OF WATER cyclobenzaprine (FLEXERIL), oxyCODONE-acetaminophen (PERCOCET/ROXICET), ranitidine (ZANTAC) , sertraline (ZOLOFT) , traMADol (ULTRAM)  7 days prior to surgery STOP taking any Aspirin, Aleve, Naproxen, Ibuprofen, Motrin, Advil, Goody's, BC's, all herbal medications, fish oil, and all vitamins    Do not wear jewelry  Do not wear lotions, powders, or cologne, or deoderant.  Men may shave face and neck.  Do not bring valuables to the hospital.  [facility] is not responsible for any belongings or valuables.  Contacts, dentures or bridgework may not be worn into surgery.  Leave your suitcase in the car.  After surgery it may be brought to your room.  For patients admitted to the hospital, discharge time will be determined by your treatment team.  Patients discharged the day of surgery will not be allowed to drive home.    Special instructions:   [facility]- Preparing For Surgery  Before surgery, you can play an important role. Because skin is not sterile, your skin needs to be as free of germs as possible. You can reduce the number of germs on your skin by washing with CHG (chlorahexidine gluconate) Soap before surgery.  CHG is an antiseptic cleaner which kills germs and bonds with the skin to continue killing germs even after washing.  Please do not use if you have an allergy to CHG or antibacterial soaps. If your skin becomes reddened/irritated stop using the CHG.  Do not shave (including legs  and underarms) for at least 48 hours prior to first CHG shower. It is OK to shave your face.  Please follow these instructions carefully.   1. Shower the NIGHT BEFORE SURGERY and the MORNING OF SURGERY with CHG.   2. If you chose to wash your hair, wash your hair first as usual with your normal shampoo.  3. After you shampoo, rinse your hair and body thoroughly to remove the shampoo.  4. Use CHG as you would any other liquid soap. You can apply CHG directly to the skin and wash gently with a scrungie or a clean washcloth.   5. Apply the CHG Soap to your body ONLY FROM THE NECK DOWN.  Do not use on open wounds or open sores. Avoid contact with your eyes, ears, mouth and genitals (private parts). Wash genitals (private parts) with your normal soap.  6. Wash thoroughly, paying special attention to the area where your surgery will be performed.  7. Thoroughly rinse your body with warm water from the neck down.  8. DO NOT shower/wash with your normal soap after using and rinsing off the CHG Soap.  9. Pat yourself dry with a CLEAN TOWEL.   10. Wear CLEAN PAJAMAS   11. Place CLEAN SHEETS on your bed the night of your first shower and DO NOT SLEEP WITH PETS.    Day of Surgery: Do not apply any deodorants/lotions. Please wear clean clothes to the hospital/surgery center.      Please read  over the following fact sheets that you were given.

## 2016-03-14 ENCOUNTER — Encounter (HOSPITAL_COMMUNITY)
Admission: RE | Admit: 2016-03-14 | Discharge: 2016-03-14 | Disposition: A | Discharge status: Home / Self Care | Payer: Medicare Other | Source: Ambulatory Visit | Attending: Neurosurgery | Admitting: Neurosurgery

## 2016-03-14 ENCOUNTER — Encounter (HOSPITAL_COMMUNITY): Payer: Self-pay

## 2016-03-14 DIAGNOSIS — Z01812 Encounter for preprocedural laboratory examination: Secondary | ICD-10-CM | POA: Diagnosis present

## 2016-03-14 DIAGNOSIS — I110 Hypertensive heart disease with heart failure: Secondary | ICD-10-CM | POA: Insufficient documentation

## 2016-03-14 DIAGNOSIS — Z01810 Encounter for preprocedural cardiovascular examination: Secondary | ICD-10-CM | POA: Insufficient documentation

## 2016-03-14 DIAGNOSIS — M545 Low back pain: Secondary | ICD-10-CM | POA: Insufficient documentation

## 2016-03-14 DIAGNOSIS — Z8673 Personal history of transient ischemic attack (TIA), and cerebral infarction without residual deficits: Secondary | ICD-10-CM | POA: Insufficient documentation

## 2016-03-14 DIAGNOSIS — N289 Disorder of kidney and ureter, unspecified: Secondary | ICD-10-CM | POA: Insufficient documentation

## 2016-03-14 DIAGNOSIS — M48061 Spinal stenosis, lumbar region without neurogenic claudication: Secondary | ICD-10-CM | POA: Insufficient documentation

## 2016-03-14 DIAGNOSIS — I509 Heart failure, unspecified: Secondary | ICD-10-CM | POA: Diagnosis not present

## 2016-03-14 HISTORY — DX: Major depressive disorder, single episode, unspecified: F32.9

## 2016-03-14 HISTORY — DX: Disorder of kidney and ureter, unspecified: N28.9

## 2016-03-14 HISTORY — DX: Depression, unspecified: F32.A

## 2016-03-14 LAB — BASIC METABOLIC PANEL
ANION GAP: 10 (ref 5–15)
BUN: 21 mg/dL — ABNORMAL HIGH (ref 6–20)
CO2: 28 mmol/L (ref 22–32)
Calcium: 9.2 mg/dL (ref 8.9–10.3)
Chloride: 101 mmol/L (ref 101–111)
Creatinine, Ser: 1.61 mg/dL — ABNORMAL HIGH (ref 0.61–1.24)
GFR calc Af Amer: 49 mL/min — ABNORMAL LOW (ref >60)
GFR, EST NON AFRICAN AMERICAN: 43 mL/min — AB (ref >60)
Glucose, Bld: 89 mg/dL (ref 65–99)
POTASSIUM: 3.8 mmol/L (ref 3.5–5.1)
SODIUM: 139 mmol/L (ref 135–145)

## 2016-03-14 LAB — CBC WITH DIFFERENTIAL/PLATELET
BASOS ABS: 0.1 10*3/uL (ref 0.0–0.1)
Basophils Relative: 1 %
Eosinophils Absolute: 0.3 10*3/uL (ref 0.0–0.7)
Eosinophils Relative: 4 %
HEMATOCRIT: 42.5 % (ref 39.0–52.0)
HEMOGLOBIN: 13.6 g/dL (ref 13.0–17.0)
Lymphocytes Relative: 18 %
Lymphs Abs: 1.5 10*3/uL (ref 0.7–4.0)
MCH: 28.5 pg (ref 26.0–34.0)
MCHC: 32 g/dL (ref 30.0–36.0)
MCV: 89.1 fL (ref 78.0–100.0)
Monocytes Absolute: 0.6 10*3/uL (ref 0.1–1.0)
Monocytes Relative: 8 %
NEUTROS ABS: 5.9 10*3/uL (ref 1.7–7.7)
NEUTROS PCT: 69 %
Platelets: 263 10*3/uL (ref 150–400)
RBC: 4.77 MIL/uL (ref 4.22–5.81)
RDW: 13.8 % (ref 11.5–15.5)
WBC: 8.4 10*3/uL (ref 4.0–10.5)

## 2016-03-14 LAB — SURGICAL PCR SCREEN
MRSA, PCR: NEGATIVE
STAPHYLOCOCCUS AUREUS: POSITIVE — AB

## 2016-03-14 NOTE — Progress Notes (Signed)
   03/14/16 1520  OBSTRUCTIVE SLEEP APNEA  Have you ever been diagnosed with sleep apnea through a sleep study? No  Do you snore loudly (loud enough to be heard through closed doors)?  0  Do you often feel tired, fatigued, or sleepy during the daytime (such as falling asleep during driving or talking to someone)? 0  Has anyone observed you stop breathing during your sleep? 0  Do you have, or are you being treated for high blood pressure? 1  BMI more than 35 kg/m2? 1  Age > 50 (1-yes) 1  Neck circumference greater than:Male 16 inches or larger, Male 17inches or larger? 1  Male Gender (Yes=1) 1  Obstructive Sleep Apnea Score 5  Score 5 or greater  Results sent to PCP

## 2016-03-14 NOTE — Progress Notes (Addendum)
Pt. Denies chest concerns, he reports for work related reason he had a stress test on a treadmill many yrs. ago & told that it was normal. Pt. Followed by Dr. Hart CarwinW. Elkins for many yrs.

## 2016-03-14 NOTE — Progress Notes (Addendum)
Anesthesia Chart Review:  Pt is a 66 year old male scheduled for L1-4 foraminotomy on 03/22/2016 with Julio SicksHenry Pool, MD.   - PCP is Windle GuardWilson Elkins, MD.   PMH includes:  CHF, HTN, renal insufficiency, PTSD, TIA. Never smoker. BMI 41. S/p T10-12 laminectomy 02/16/15.   Medications include: Lasix, lisinopril, lovastatin, Zantac  Preoperative labs reviewed.  Cr 1.61, BUN 21. Cr was previously 1.47 on 02/15/15.  I routed BMET results to PCP for follow up.   EKG 03/14/16: NSR.   Echo 04/30/14:  - Left ventricle: The cavity size was normal. Wall thickness wasincreased in a pattern of mild LVH. Systolic function was normal.The estimated ejection fraction was in the range of 60% to 65%.Doppler parameters are consistent with abnormal left ventricularrelaxation (grade 1 diastolic dysfunction). - Atrial septum: No defect or patent foramen ovale was identified.  Carotid duplex 07/09/12:  - The vertebral arteries appear patent with antegrade flow. - Findings consistent with less than 39 percent stenosis involving the right internal carotid artery and the left internal carotid artery. - ICA/CCA ratio = right = 0.98. left = 1.1.  If no changes, I anticipate pt can proceed with surgery as scheduled.   Rica Mastngela Kabbe, FNP-BC [facility]Anesthesiology Phone: [phone] 03/14/2016 5:17 PM

## 2016-03-21 MED ORDER — DEXAMETHASONE SODIUM PHOSPHATE 10 MG/ML IJ SOLN
10.0000 mg | INTRAMUSCULAR | Status: AC
  Administered 2016-03-22: 10 mg via INTRAVENOUS
  Filled 2016-03-21: qty 1

## 2016-03-21 MED ORDER — DEXTROSE 5 % IV SOLN
3.0000 g | INTRAVENOUS | Status: AC
  Administered 2016-03-22: 2 g via INTRAVENOUS
  Filled 2016-03-21: qty 3000

## 2016-03-22 ENCOUNTER — Inpatient Hospital Stay (HOSPITAL_COMMUNITY): Payer: Medicare Other

## 2016-03-22 ENCOUNTER — Encounter (HOSPITAL_COMMUNITY): Payer: Self-pay

## 2016-03-22 ENCOUNTER — Inpatient Hospital Stay (HOSPITAL_COMMUNITY): Payer: Medicare Other | Admitting: Certified Registered Nurse Anesthetist

## 2016-03-22 ENCOUNTER — Inpatient Hospital Stay (HOSPITAL_COMMUNITY)
Admission: RE | Admit: 2016-03-22 | Discharge: 2016-03-23 | DRG: 516 | Disposition: A | Discharge status: Home / Self Care | Payer: Medicare Other | Source: Ambulatory Visit | Attending: Neurosurgery | Admitting: Neurosurgery

## 2016-03-22 ENCOUNTER — Inpatient Hospital Stay (HOSPITAL_COMMUNITY): Payer: Medicare Other | Admitting: Emergency Medicine

## 2016-03-22 ENCOUNTER — Encounter (HOSPITAL_COMMUNITY)
Admission: RE | Disposition: A | Discharge status: Home / Self Care | Payer: Self-pay | Source: Ambulatory Visit | Attending: Neurosurgery

## 2016-03-22 DIAGNOSIS — Z981 Arthrodesis status: Secondary | ICD-10-CM | POA: Diagnosis not present

## 2016-03-22 DIAGNOSIS — F4310 Post-traumatic stress disorder, unspecified: Secondary | ICD-10-CM | POA: Diagnosis not present

## 2016-03-22 DIAGNOSIS — Z8673 Personal history of transient ischemic attack (TIA), and cerebral infarction without residual deficits: Secondary | ICD-10-CM | POA: Diagnosis not present

## 2016-03-22 DIAGNOSIS — I509 Heart failure, unspecified: Secondary | ICD-10-CM | POA: Diagnosis present

## 2016-03-22 DIAGNOSIS — F329 Major depressive disorder, single episode, unspecified: Secondary | ICD-10-CM | POA: Diagnosis present

## 2016-03-22 DIAGNOSIS — R531 Weakness: Secondary | ICD-10-CM | POA: Diagnosis present

## 2016-03-22 DIAGNOSIS — Z419 Encounter for procedure for purposes other than remedying health state, unspecified: Secondary | ICD-10-CM

## 2016-03-22 DIAGNOSIS — M48062 Spinal stenosis, lumbar region with neurogenic claudication: Principal | ICD-10-CM | POA: Diagnosis present

## 2016-03-22 DIAGNOSIS — Z79899 Other long term (current) drug therapy: Secondary | ICD-10-CM | POA: Diagnosis not present

## 2016-03-22 DIAGNOSIS — I110 Hypertensive heart disease with heart failure: Secondary | ICD-10-CM | POA: Diagnosis not present

## 2016-03-22 DIAGNOSIS — Z6841 Body Mass Index (BMI) 40.0 and over, adult: Secondary | ICD-10-CM | POA: Diagnosis not present

## 2016-03-22 DIAGNOSIS — M549 Dorsalgia, unspecified: Secondary | ICD-10-CM | POA: Diagnosis present

## 2016-03-22 DIAGNOSIS — M5126 Other intervertebral disc displacement, lumbar region: Secondary | ICD-10-CM | POA: Diagnosis not present

## 2016-03-22 DIAGNOSIS — G8929 Other chronic pain: Secondary | ICD-10-CM | POA: Diagnosis not present

## 2016-03-22 HISTORY — PX: LUMBAR LAMINECTOMY/DECOMPRESSION MICRODISCECTOMY: SHX5026

## 2016-03-22 SURGERY — LUMBAR LAMINECTOMY/DECOMPRESSION MICRODISCECTOMY 3 LEVELS
Anesthesia: General | Site: Spine Lumbar

## 2016-03-22 MED ORDER — ARTIFICIAL TEARS OP OINT
TOPICAL_OINTMENT | OPHTHALMIC | Status: DC | PRN
  Administered 2016-03-22: 1 via OPHTHALMIC

## 2016-03-22 MED ORDER — HEMOSTATIC AGENTS (NO CHARGE) OPTIME
TOPICAL | Status: DC | PRN
  Administered 2016-03-22: 1 via TOPICAL

## 2016-03-22 MED ORDER — HYDROMORPHONE HCL 1 MG/ML IJ SOLN
0.2500 mg | INTRAMUSCULAR | Status: DC | PRN
  Administered 2016-03-22 (×2): 0.5 mg via INTRAVENOUS

## 2016-03-22 MED ORDER — HYDROMORPHONE HCL 1 MG/ML IJ SOLN
INTRAMUSCULAR | Status: AC
  Filled 2016-03-22: qty 0.5

## 2016-03-22 MED ORDER — SODIUM CHLORIDE 0.9% FLUSH: 3.0000 mL | INTRAVENOUS | Status: DC | PRN

## 2016-03-22 MED ORDER — PHENOL 1.4 % MT LIQD: 1.0000 | OROMUCOSAL | Status: DC | PRN

## 2016-03-22 MED ORDER — MUPIROCIN 2 % EX OINT
TOPICAL_OINTMENT | CUTANEOUS | Status: AC
  Administered 2016-03-22: 10:00:00
  Filled 2016-03-22: qty 22

## 2016-03-22 MED ORDER — OXYCODONE HCL 5 MG PO TABS: 5.0000 mg | ORAL_TABLET | Freq: Once | ORAL | Status: DC | PRN

## 2016-03-22 MED ORDER — ONDANSETRON HCL 4 MG/2ML IJ SOLN
INTRAMUSCULAR | Status: AC
  Filled 2016-03-22: qty 4

## 2016-03-22 MED ORDER — SUGAMMADEX SODIUM 200 MG/2ML IV SOLN
INTRAVENOUS | Status: DC | PRN
  Administered 2016-03-22: 150 mg via INTRAVENOUS
  Administered 2016-03-22: 100 mg via INTRAVENOUS
  Administered 2016-03-22: 50 mg via INTRAVENOUS

## 2016-03-22 MED ORDER — THROMBIN 20000 UNITS EX SOLR
CUTANEOUS | Status: AC
  Filled 2016-03-22: qty 20000

## 2016-03-22 MED ORDER — ALBUMIN HUMAN 5 % IV SOLN
INTRAVENOUS | Status: DC | PRN
  Administered 2016-03-22 (×2): via INTRAVENOUS

## 2016-03-22 MED ORDER — THROMBIN 5000 UNITS EX SOLR
CUTANEOUS | Status: AC
  Filled 2016-03-22: qty 5000

## 2016-03-22 MED ORDER — LACTATED RINGERS IV SOLN
INTRAVENOUS | Status: DC
  Administered 2016-03-22 (×3): via INTRAVENOUS

## 2016-03-22 MED ORDER — HYDROMORPHONE HCL 1 MG/ML IJ SOLN
INTRAMUSCULAR | Status: AC
  Administered 2016-03-22: 0.5 mg via INTRAVENOUS
  Filled 2016-03-22: qty 0.5

## 2016-03-22 MED ORDER — HYDROMORPHONE HCL 1 MG/ML IJ SOLN: 0.5000 mg | INTRAMUSCULAR | Status: DC | PRN

## 2016-03-22 MED ORDER — BUPIVACAINE HCL (PF) 0.25 % IJ SOLN
INTRAMUSCULAR | Status: AC
  Filled 2016-03-22: qty 30

## 2016-03-22 MED ORDER — PRAVASTATIN SODIUM 40 MG PO TABS
20.0000 mg | ORAL_TABLET | Freq: Every day | ORAL | Status: DC
  Administered 2016-03-22: 20 mg via ORAL
  Filled 2016-03-22: qty 1

## 2016-03-22 MED ORDER — FUROSEMIDE 40 MG PO TABS
40.0000 mg | ORAL_TABLET | Freq: Two times a day (BID) | ORAL | Status: DC
  Administered 2016-03-22 – 2016-03-23 (×2): 40 mg via ORAL
  Filled 2016-03-22 (×2): qty 1

## 2016-03-22 MED ORDER — SODIUM CHLORIDE 0.9 % IV SOLN: 250.0000 mL | INTRAVENOUS | Status: DC

## 2016-03-22 MED ORDER — FLUTICASONE PROPIONATE 50 MCG/ACT NA SUSP: 2.0000 | Freq: Every day | NASAL | Status: DC | PRN

## 2016-03-22 MED ORDER — THROMBIN 20000 UNITS EX SOLR
CUTANEOUS | Status: DC | PRN
  Administered 2016-03-22: 14:00:00 via TOPICAL

## 2016-03-22 MED ORDER — ONDANSETRON HCL 4 MG/2ML IJ SOLN
INTRAMUSCULAR | Status: DC | PRN
  Administered 2016-03-22: 4 mg via INTRAVENOUS

## 2016-03-22 MED ORDER — FAMOTIDINE 20 MG PO TABS
20.0000 mg | ORAL_TABLET | Freq: Two times a day (BID) | ORAL | Status: DC
  Administered 2016-03-22 – 2016-03-23 (×2): 20 mg via ORAL
  Filled 2016-03-22 (×2): qty 1

## 2016-03-22 MED ORDER — CHLORHEXIDINE GLUCONATE CLOTH 2 % EX PADS: 6.0000 | MEDICATED_PAD | Freq: Once | CUTANEOUS | Status: DC

## 2016-03-22 MED ORDER — CYCLOBENZAPRINE HCL 10 MG PO TABS
ORAL_TABLET | ORAL | Status: AC
  Filled 2016-03-22: qty 1

## 2016-03-22 MED ORDER — LIDOCAINE 2% (20 MG/ML) 5 ML SYRINGE
INTRAMUSCULAR | Status: AC
  Filled 2016-03-22: qty 15

## 2016-03-22 MED ORDER — MUPIROCIN 2 % EX OINT: 1.0000 "application " | TOPICAL_OINTMENT | Freq: Once | CUTANEOUS | Status: DC

## 2016-03-22 MED ORDER — HYDROCODONE-ACETAMINOPHEN 5-325 MG PO TABS
1.0000 | ORAL_TABLET | ORAL | Status: DC | PRN
  Administered 2016-03-22 – 2016-03-23 (×4): 2 via ORAL
  Filled 2016-03-22 (×4): qty 2

## 2016-03-22 MED ORDER — EPHEDRINE 5 MG/ML INJ
INTRAVENOUS | Status: AC
  Filled 2016-03-22: qty 20

## 2016-03-22 MED ORDER — VANCOMYCIN HCL 1000 MG IV SOLR
INTRAVENOUS | Status: DC | PRN
  Administered 2016-03-22: 1000 mg via TOPICAL

## 2016-03-22 MED ORDER — MIDAZOLAM HCL 5 MG/5ML IJ SOLN
INTRAMUSCULAR | Status: DC | PRN
  Administered 2016-03-22: 2 mg via INTRAVENOUS

## 2016-03-22 MED ORDER — PHENYLEPHRINE 40 MCG/ML (10ML) SYRINGE FOR IV PUSH (FOR BLOOD PRESSURE SUPPORT)
PREFILLED_SYRINGE | INTRAVENOUS | Status: DC | PRN
  Administered 2016-03-22 (×5): 80 ug via INTRAVENOUS

## 2016-03-22 MED ORDER — 0.9 % SODIUM CHLORIDE (POUR BTL) OPTIME
TOPICAL | Status: DC | PRN
  Administered 2016-03-22: 1000 mL

## 2016-03-22 MED ORDER — POLYVINYL ALCOHOL 1.4 % OP SOLN: OPHTHALMIC | Status: DC | PRN

## 2016-03-22 MED ORDER — MENTHOL 3 MG MT LOZG: 1.0000 | LOZENGE | OROMUCOSAL | Status: DC | PRN

## 2016-03-22 MED ORDER — LISINOPRIL 20 MG PO TABS
20.0000 mg | ORAL_TABLET | Freq: Every day | ORAL | Status: DC
  Administered 2016-03-22 – 2016-03-23 (×2): 20 mg via ORAL
  Filled 2016-03-22 (×2): qty 1

## 2016-03-22 MED ORDER — SODIUM CHLORIDE 0.9% FLUSH: 3.0000 mL | Freq: Two times a day (BID) | INTRAVENOUS | Status: DC

## 2016-03-22 MED ORDER — FENTANYL CITRATE (PF) 100 MCG/2ML IJ SOLN
INTRAMUSCULAR | Status: AC
Start: 2016-03-22 — End: 2016-03-22
  Filled 2016-03-22: qty 2

## 2016-03-22 MED ORDER — SERTRALINE HCL 50 MG PO TABS
100.0000 mg | ORAL_TABLET | Freq: Every day | ORAL | Status: DC
  Administered 2016-03-23: 100 mg via ORAL
  Filled 2016-03-22: qty 2

## 2016-03-22 MED ORDER — KETOROLAC TROMETHAMINE 15 MG/ML IJ SOLN
15.0000 mg | Freq: Four times a day (QID) | INTRAMUSCULAR | Status: DC
  Administered 2016-03-22 – 2016-03-23 (×3): 15 mg via INTRAVENOUS
  Filled 2016-03-22 (×3): qty 1

## 2016-03-22 MED ORDER — ROCURONIUM BROMIDE 50 MG/5ML IV SOSY
PREFILLED_SYRINGE | INTRAVENOUS | Status: AC
  Filled 2016-03-22: qty 30

## 2016-03-22 MED ORDER — DEXAMETHASONE SODIUM PHOSPHATE 10 MG/ML IJ SOLN
INTRAMUSCULAR | Status: AC
  Filled 2016-03-22: qty 2

## 2016-03-22 MED ORDER — PROPOFOL 10 MG/ML IV BOLUS
INTRAVENOUS | Status: AC
  Filled 2016-03-22: qty 20

## 2016-03-22 MED ORDER — KETOROLAC TROMETHAMINE 15 MG/ML IJ SOLN
INTRAMUSCULAR | Status: DC | PRN
  Administered 2016-03-22: 15 mg via INTRAVENOUS

## 2016-03-22 MED ORDER — EPHEDRINE SULFATE 50 MG/ML IJ SOLN
INTRAMUSCULAR | Status: DC | PRN
  Administered 2016-03-22 (×2): 5 mg via INTRAVENOUS
  Administered 2016-03-22: 10 mg via INTRAVENOUS
  Administered 2016-03-22 (×2): 5 mg via INTRAVENOUS

## 2016-03-22 MED ORDER — LIDOCAINE 2% (20 MG/ML) 5 ML SYRINGE
INTRAMUSCULAR | Status: DC | PRN
  Administered 2016-03-22: 100 mg via INTRAVENOUS

## 2016-03-22 MED ORDER — ONDANSETRON HCL 4 MG PO TABS: 4.0000 mg | ORAL_TABLET | Freq: Four times a day (QID) | ORAL | Status: DC | PRN

## 2016-03-22 MED ORDER — SODIUM CHLORIDE 0.9 % IR SOLN
Status: DC | PRN
  Administered 2016-03-22: 12:00:00

## 2016-03-22 MED ORDER — OXYCODONE HCL 5 MG/5ML PO SOLN: 5.0000 mg | Freq: Once | ORAL | Status: DC | PRN

## 2016-03-22 MED ORDER — ROCURONIUM BROMIDE 10 MG/ML (PF) SYRINGE
PREFILLED_SYRINGE | INTRAVENOUS | Status: DC | PRN
  Administered 2016-03-22: 50 mg via INTRAVENOUS
  Administered 2016-03-22 (×3): 10 mg via INTRAVENOUS
  Administered 2016-03-22 (×2): 20 mg via INTRAVENOUS

## 2016-03-22 MED ORDER — SUCCINYLCHOLINE CHLORIDE 200 MG/10ML IV SOSY
PREFILLED_SYRINGE | INTRAVENOUS | Status: AC
  Filled 2016-03-22: qty 20

## 2016-03-22 MED ORDER — MIDAZOLAM HCL 2 MG/2ML IJ SOLN
INTRAMUSCULAR | Status: AC
  Filled 2016-03-22: qty 2

## 2016-03-22 MED ORDER — PROPOFOL 10 MG/ML IV BOLUS
INTRAVENOUS | Status: DC | PRN
  Administered 2016-03-22: 140 mg via INTRAVENOUS

## 2016-03-22 MED ORDER — FENTANYL CITRATE (PF) 100 MCG/2ML IJ SOLN
INTRAMUSCULAR | Status: DC | PRN
  Administered 2016-03-22: 100 ug via INTRAVENOUS
  Administered 2016-03-22 (×2): 50 ug via INTRAVENOUS

## 2016-03-22 MED ORDER — ZOLPIDEM TARTRATE 5 MG PO TABS: 5.0000 mg | ORAL_TABLET | Freq: Every evening | ORAL | Status: DC | PRN

## 2016-03-22 MED ORDER — ONDANSETRON HCL 4 MG/2ML IJ SOLN: 4.0000 mg | Freq: Four times a day (QID) | INTRAMUSCULAR | Status: DC | PRN

## 2016-03-22 MED ORDER — PHENYLEPHRINE HCL 10 MG/ML IJ SOLN
INTRAMUSCULAR | Status: DC | PRN
  Administered 2016-03-22: 50 ug/min via INTRAVENOUS

## 2016-03-22 MED ORDER — LACTATED RINGERS IV SOLN: INTRAVENOUS | Status: DC | PRN

## 2016-03-22 MED ORDER — FENTANYL CITRATE (PF) 100 MCG/2ML IJ SOLN
INTRAMUSCULAR | Status: AC
  Filled 2016-03-22: qty 2

## 2016-03-22 MED ORDER — CEFAZOLIN IN D5W 1 GM/50ML IV SOLN
1.0000 g | Freq: Three times a day (TID) | INTRAVENOUS | Status: AC
  Administered 2016-03-22 – 2016-03-23 (×2): 1 g via INTRAVENOUS
  Filled 2016-03-22 (×2): qty 50

## 2016-03-22 MED ORDER — BUPIVACAINE HCL (PF) 0.25 % IJ SOLN
INTRAMUSCULAR | Status: DC | PRN
  Administered 2016-03-22: 20 mL

## 2016-03-22 MED ORDER — VANCOMYCIN HCL 1000 MG IV SOLR
INTRAVENOUS | Status: AC
  Filled 2016-03-22: qty 1000

## 2016-03-22 MED ORDER — FENTANYL CITRATE (PF) 100 MCG/2ML IJ SOLN
INTRAMUSCULAR | Status: AC
  Filled 2016-03-22: qty 4

## 2016-03-22 MED ORDER — SUGAMMADEX SODIUM 200 MG/2ML IV SOLN
INTRAVENOUS | Status: AC
  Filled 2016-03-22: qty 4

## 2016-03-22 MED ORDER — THROMBIN 5000 UNITS EX SOLR
CUTANEOUS | Status: DC | PRN
  Administered 2016-03-22 (×2): 5000 [IU] via TOPICAL

## 2016-03-22 MED ORDER — CYCLOBENZAPRINE HCL 10 MG PO TABS
10.0000 mg | ORAL_TABLET | Freq: Three times a day (TID) | ORAL | Status: DC | PRN
  Administered 2016-03-22 – 2016-03-23 (×2): 10 mg via ORAL
  Filled 2016-03-22: qty 1

## 2016-03-22 MED ORDER — PHENYLEPHRINE 40 MCG/ML (10ML) SYRINGE FOR IV PUSH (FOR BLOOD PRESSURE SUPPORT)
PREFILLED_SYRINGE | INTRAVENOUS | Status: AC
  Filled 2016-03-22: qty 20

## 2016-03-22 MED ORDER — GABAPENTIN 300 MG PO CAPS
300.0000 mg | ORAL_CAPSULE | Freq: Three times a day (TID) | ORAL | Status: DC
  Administered 2016-03-22 – 2016-03-23 (×3): 300 mg via ORAL
  Filled 2016-03-22 (×3): qty 1

## 2016-03-22 SURGICAL SUPPLY — 53 items
ADH SKN CLS APL DERMABOND .7 (GAUZE/BANDAGES/DRESSINGS) ×1
APL SKNCLS STERI-STRIP NONHPOA (GAUZE/BANDAGES/DRESSINGS) ×1
BAG DECANTER FOR FLEXI CONT (MISCELLANEOUS) ×3 IMPLANT
BENZOIN TINCTURE PRP APPL 2/3 (GAUZE/BANDAGES/DRESSINGS) ×3 IMPLANT
BUR CUTTER 7.0 ROUND (BURR) ×3 IMPLANT
CANISTER SUCT 3000ML PPV (MISCELLANEOUS) ×3 IMPLANT
CARTRIDGE OIL MAESTRO DRILL (MISCELLANEOUS) ×1 IMPLANT
CLOSURE WOUND 1/2 X4 (GAUZE/BANDAGES/DRESSINGS) ×1
DERMABOND ADVANCED (GAUZE/BANDAGES/DRESSINGS) ×2
DERMABOND ADVANCED .7 DNX12 (GAUZE/BANDAGES/DRESSINGS) ×1 IMPLANT
DIFFUSER DRILL AIR PNEUMATIC (MISCELLANEOUS) ×3 IMPLANT
DRAPE HALF SHEET 40X57 (DRAPES) IMPLANT
DRAPE LAPAROTOMY 100X72X124 (DRAPES) ×3 IMPLANT
DRAPE MICROSCOPE LEICA (MISCELLANEOUS) IMPLANT
DRAPE POUCH INSTRU U-SHP 10X18 (DRAPES) ×3 IMPLANT
DRAPE SURG 17X23 STRL (DRAPES) ×6 IMPLANT
DRSG OPSITE POSTOP 4X6 (GAUZE/BANDAGES/DRESSINGS) ×3 IMPLANT
DURAPREP 26ML APPLICATOR (WOUND CARE) ×3 IMPLANT
ELECT REM PT RETURN 9FT ADLT (ELECTROSURGICAL) ×3
ELECTRODE REM PT RTRN 9FT ADLT (ELECTROSURGICAL) ×1 IMPLANT
EVACUATOR 1/8 PVC DRAIN (DRAIN) ×3 IMPLANT
GAUZE SPONGE 4X4 12PLY STRL (GAUZE/BANDAGES/DRESSINGS) ×3 IMPLANT
GAUZE SPONGE 4X4 16PLY XRAY LF (GAUZE/BANDAGES/DRESSINGS) IMPLANT
GLOVE BIOGEL PI IND STRL 7.5 (GLOVE) ×3 IMPLANT
GLOVE BIOGEL PI INDICATOR 7.5 (GLOVE) ×6
GLOVE ECLIPSE 9.0 STRL (GLOVE) ×6 IMPLANT
GLOVE EXAM NITRILE LRG STRL (GLOVE) IMPLANT
GLOVE EXAM NITRILE XL STR (GLOVE) IMPLANT
GLOVE EXAM NITRILE XS STR PU (GLOVE) IMPLANT
GLOVE SS N UNI LF 7.0 STRL (GLOVE) ×6 IMPLANT
GLOVE SS N UNI LF 7.5 STRL (GLOVE) ×4 IMPLANT
GOWN STRL REUS W/ TWL LRG LVL3 (GOWN DISPOSABLE) ×2 IMPLANT
GOWN STRL REUS W/ TWL XL LVL3 (GOWN DISPOSABLE) ×1 IMPLANT
GOWN STRL REUS W/TWL 2XL LVL3 (GOWN DISPOSABLE) IMPLANT
GOWN STRL REUS W/TWL LRG LVL3 (GOWN DISPOSABLE) ×6
GOWN STRL REUS W/TWL XL LVL3 (GOWN DISPOSABLE) ×3
KIT BASIN OR (CUSTOM PROCEDURE TRAY) ×3 IMPLANT
KIT ROOM TURNOVER OR (KITS) ×3 IMPLANT
NEEDLE HYPO 22GX1.5 SAFETY (NEEDLE) ×3 IMPLANT
NEEDLE SPNL 22GX3.5 QUINCKE BK (NEEDLE) ×3 IMPLANT
NS IRRIG 1000ML POUR BTL (IV SOLUTION) ×3 IMPLANT
OIL CARTRIDGE MAESTRO DRILL (MISCELLANEOUS) ×3
PACK LAMINECTOMY NEURO (CUSTOM PROCEDURE TRAY) ×3 IMPLANT
PAD ARMBOARD 7.5X6 YLW CONV (MISCELLANEOUS) ×9 IMPLANT
RUBBERBAND STERILE (MISCELLANEOUS) ×2 IMPLANT
SPONGE SURGIFOAM ABS GEL 100 (HEMOSTASIS) ×3 IMPLANT
SPONGE SURGIFOAM ABS GEL SZ50 (HEMOSTASIS) ×3 IMPLANT
STRIP CLOSURE SKIN 1/2X4 (GAUZE/BANDAGES/DRESSINGS) ×2 IMPLANT
SUT VIC AB 2-0 CT1 18 (SUTURE) ×3 IMPLANT
SUT VIC AB 3-0 SH 8-18 (SUTURE) ×3 IMPLANT
TOWEL GREEN STERILE (TOWEL DISPOSABLE) ×3 IMPLANT
TOWEL GREEN STERILE FF (TOWEL DISPOSABLE) ×2 IMPLANT
WATER STERILE IRR 1000ML POUR (IV SOLUTION) ×3 IMPLANT

## 2016-03-22 NOTE — Anesthesia Procedure Notes (Signed)
Procedure Name: Intubation Date/Time: 03/22/2016 11:43 AM Performed by: Daiva EvesAVENEL, Kaydince Towles W Pre-anesthesia Checklist: Patient identified, Emergency Drugs available, Suction available, Patient being monitored and Timeout performed Patient Re-evaluated:Patient Re-evaluated prior to inductionOxygen Delivery Method: Circle system utilized Preoxygenation: Pre-oxygenation with 100% oxygen Intubation Type: IV induction Ventilation: Mask ventilation without difficulty Laryngoscope Size: Miller and 2 Grade View: Grade I Tube type: Oral Tube size: 7.5 mm Number of attempts: 1 Airway Equipment and Method: Stylet Placement Confirmation: ETT inserted through vocal cords under direct vision,  positive ETCO2 and breath sounds checked- equal and bilateral Secured at: 22 cm Tube secured with: Tape Dental Injury: Teeth and Oropharynx as per pre-operative assessment

## 2016-03-22 NOTE — H&P (Signed)
Kurt [name] is an 66 y.o. male.   Chief Complaint: Bilateral leg weakness HPI: 66 year old male presents with bilateral lower extremity sensory loss and weakness consistent with neurogenic claudication. The symptoms have failed conservative management. Patient is status post previous lower thoracic decompressive surgery with good results. He now presents with worsening lower extremity symptoms. Workup demonstrates evidence of significant stenosis at L1-3 and 4. At L1-2 on the right side the patient has evidence for significant disc herniation. At L2-3 he has multifactorial stenosis with a leftward disc herniation. At L3-4 the patient has multifactorial stenosis.  Past Medical History:  Diagnosis Date  . Arthritis   . CHF (congestive heart failure) (HCC)    diastolic dysfunction  . Chronic back pain   . Depression   . Diverticulitis   . Hypertension   . PTSD (post-traumatic stress disorder)    after  a Fall   . Renal insufficiency    Decrease in GFR- PCP removed all NSAIDS & aspirin   . TIA (transient ischemic attack) 2014   patient denies states was [place]    Past Surgical History:  Procedure Laterality Date  . ELBOW FRACTURE SURGERY     ORIF, Shoulder manipulation    . ESOPHAGOGASTRODUODENOSCOPY N/A 04/30/2014   Procedure: ESOPHAGOGASTRODUODENOSCOPY (EGD);  Surgeon: Jeani HawkingPatrick Hung, MD;  [facility];  Service: Endoscopy;  Laterality: N/A;  . LUMBAR LAMINECTOMY/DECOMPRESSION MICRODISCECTOMY N/A 02/16/2015   Procedure: Thoracic ten - thoracic twelve laminectomy;  Surgeon: Julio SicksHenry Necola Bluestein, MD;  Location: [facility] NEURO ORS;  Service: Neurosurgery;  Laterality: N/A;  . NASAL SEPTUM SURGERY      History reviewed. No pertinent family history. Social History:  reports that he has never smoked. He has never used smokeless tobacco. He reports that he drinks alcohol. He reports that he does not use drugs.  Allergies:  Allergies  Allergen Reactions  . No Known Allergies     Medications  Prior to Admission  Medication Sig Dispense Refill  . acetaminophen (TYLENOL) 650 MG CR tablet Take 1,300 mg by mouth every 8 (eight) hours as needed for pain.    . fluticasone (FLONASE) 50 MCG/ACT nasal spray Place 2 sprays into both nostrils daily as needed for allergies or rhinitis.    . furosemide (LASIX) 80 MG tablet Take 40 mg by mouth 2 (two) times daily.    Marland Kitchen. gabapentin (NEURONTIN) 300 MG capsule Take 300 mg by mouth 3 (three) times daily.    Marland Kitchen. lisinopril (PRINIVIL,ZESTRIL) 20 MG tablet Take 20 mg by mouth daily.  0  . lovastatin (MEVACOR) 20 MG tablet Take 20 mg by mouth at bedtime.    . Polyvinyl Alcohol-Povidone (MURINE TEARS FOR DRY EYES OP) Place 2 drops into both eyes as needed (for dry eyes).    . ranitidine (ZANTAC) 150 MG tablet Take 150 mg by mouth 2 (two) times daily as needed for heartburn.   0  . sertraline (ZOLOFT) 100 MG tablet Take 1 tablet (100 mg total) by mouth daily. (Patient taking differently: Take 200 mg by mouth daily before breakfast. ) 90 tablet 3  . zolpidem (AMBIEN) 5 MG tablet Take 5 mg by mouth at bedtime as needed for sleep.    . cyclobenzaprine (FLEXERIL) 10 MG tablet Take 1 tablet (10 mg total) by mouth 3 (three) times daily as needed for muscle spasms. (Patient not taking: Reported on 03/14/2016) 30 tablet 0  . oxyCODONE-acetaminophen (PERCOCET/ROXICET) 5-325 MG tablet Take 1-2 tablets by mouth every 4 (four) hours as needed for moderate  pain. (Patient not taking: Reported on 03/14/2016) 80 tablet 0  . traMADol (ULTRAM) 50 MG tablet Take 1 tablet (50 mg total) by mouth every 6 (six) hours as needed (moderate pain). (Patient not taking: Reported on 03/14/2016) 30 tablet 0    No results found for this or any previous visit (from the past 48 hour(s)). No results found.  A comprehensive review of systems was negative.  Blood pressure (!) 144/92, pulse 96, temperature 99.7 F (37.6 C), temperature source Oral, resp. rate 18, height 5\' 10"  (1.778 m), weight  130.2 kg (287 lb), SpO2 97 %.  Patient is awake and alert. He is oriented and appropriate. His cranial nerve function is intact. His motor examination is grossly intact. Sensory examination with patchy sensory loss distally in both lower extremities. Reflexes are hypoactive. No evidence of long track signs. Gait is mildly antalgic. Posture is flexed. Examination head ears eyes and throat are marked. Chest and abdomen are benign. Extremities are free from injury or deformity. Assessment/Plan L1-2, L2-3, L3-4 stenosis with neurogenic claudication. Plan right L1 to decompressive laminotomy with microdiscectomy. Bilateral L2-3 decompressive laminotomies with left microdiscectomy and bilateral L3-4 decompressive laminotomies with foraminotomies. Risks and benefits been explained. Patient wishes to proceed.  Ayren Zumbro A 03/22/2016, 10:55 AM

## 2016-03-22 NOTE — Op Note (Signed)
Date of procedure: 03/22/2016  Date of dictation: Same  Service: Neurosurgery  Preoperative diagnosis: Lumbar stenosis with neurogenic claudication  Postoperative diagnosis: Same  Procedure Name: Right L1-L2 laminotomy and foraminotomy, bilateral L2-3 laminotomy and foraminotomies, bilateral L3-4 laminotomy and foraminotomies.  Surgeon:Chiyoko Torrico A.Elija Mccamish, M.D.  Asst. Surgeon: Franky Machoabbell  Anesthesia: General  Indication: 66 year old man with back and bilateral lower extremity pain. Seizures and numbness consistent with neurogenic claudication. Patient with marked multifactorial stenosis in his upper lumbar spine which is worsened on serial scans. Patient presents now for decompressive surgery in hopes of improving his symptoms.  Operative note: After induction anesthesia, patient position prone onto Wilson frame and appropriately padded. Lumbar region prepped and draped sterilely. Incision made overlying L1-2-3 4. Dissection performed. Retractor placed. X-ray taken. Levels confirmed. Starting first on the left side decompressive laminotomies and foraminotomies were then performed using the high-speed drill and Kerrison rongeurs at L2-3 and L3-4. Inferior aspect of the lamina above were removed. Medial aspect of the facet joint complex was resected and the superior aspect of the lamina below was resected. Ligament flavum was elevated and resected. Underlying thecal sac was then identified. Foraminotomies were then performed on course exiting nerve roots. This procedure was repeated at L3-4 on the left and then dissection was performed on the right and laminotomies were performed on the right side at L1-L2 3 and L3-4. There is a disc herniation apparent on the scan on the right side at L1-2. Thecal sac and L2 never gently mobilized retracted towards midline. The disc itself was calcified and there was no soft component which I felt that incision or extensive discectomy would be helpful. I thought simple  decompression alone was the best choice for this abnormality. Wound is then irrigated MI solution. Gelfoam was placed topically at all 5 laminotomy sites. Medium Hemovac drain was left in the epidural space. Vancomycin powder placed the deep wound space. Wounds and close in layers with Vicryl sutures. Steri-Strips and sterile dressing were applied. No apparent complications. Patient tolerated the procedure well and he returns to the recovery room postop.

## 2016-03-22 NOTE — Anesthesia Preprocedure Evaluation (Addendum)
Anesthesia Evaluation  Patient identified by MRN, date of birth, ID band Patient awake    Reviewed: Allergy & Precautions, NPO status , Patient's Chart, lab work & pertinent test results  History of Anesthesia Complications Negative for: history of anesthetic complications  Airway Mallampati: II  TM Distance: <3 FB Neck ROM: Full    Dental  (+) Teeth Intact, Dental Advisory Given,    Pulmonary neg pulmonary ROS,    breath sounds clear to auscultation       Cardiovascular hypertension, Pt. on medications +CHF   Rhythm:Regular     Neuro/Psych PSYCHIATRIC DISORDERS Depression TIA   GI/Hepatic negative GI ROS, Neg liver ROS,   Endo/Other  Morbid obesity  Renal/GU Renal InsufficiencyRenal disease     Musculoskeletal  (+) Arthritis ,   Abdominal   Peds  Hematology   Anesthesia Other Findings   Reproductive/Obstetrics                            Anesthesia Physical Anesthesia Plan  ASA: III  Anesthesia Plan: General   Post-op Pain Management:    Induction: Intravenous  Airway Management Planned: Oral ETT  Additional Equipment: None  Intra-op Plan:   Post-operative Plan: Extubation in OR  Informed Consent: I have reviewed the patients History and Physical, chart, labs and discussed the procedure including the risks, benefits and alternatives for the proposed anesthesia with the patient or authorized representative who has indicated his/her understanding and acceptance.   Dental advisory given  Plan Discussed with: CRNA and Surgeon  Anesthesia Plan Comments:         Anesthesia Quick Evaluation

## 2016-03-22 NOTE — Brief Op Note (Signed)
03/22/2016  2:14 PM  PATIENT:  Kurt [name]  66 y.o. male  PRE-OPERATIVE DIAGNOSIS:  Stenosis lumbar  POST-OPERATIVE DIAGNOSIS:  Stenosis lumbar  PROCEDURE:  Procedure(s): Lumbar One-Two, Lumbar Two-Three, Lumbar Three-Four Laminectomy and Foraminotomy (N/A)  SURGEON:  Surgeon(s) and Role:    * Julio SicksHenry Bowie Doiron, MD - Primary    * Coletta MemosKyle Cabbell, MD - Assisting  PHYSICIAN ASSISTANT:   ASSISTANTS:    ANESTHESIA:   general  EBL:  Total I/O In: 2500 [I.V.:2000; IV Piggyback:500] Out: 425 [Blood:425]  BLOOD ADMINISTERED:none  DRAINS: (Medium) Hemovact drain(s) in the Epidural space with  Suction Open   LOCAL MEDICATIONS USED:  MARCAINE     SPECIMEN:  No Specimen  DISPOSITION OF SPECIMEN:  N/A  COUNTS:  YES  TOURNIQUET:  * No tourniquets in log *  DICTATION: .Dragon Dictation  PLAN OF CARE: Admit to inpatient   PATIENT DISPOSITION:  PACU - hemodynamically stable.   Delay start of Pharmacological VTE agent (>24hrs) due to surgical blood loss or risk of bleeding: yes

## 2016-03-22 NOTE — Transfer of Care (Signed)
Immediate Anesthesia Transfer of Care Note  Patient: Kurt [name]  Procedure(s) Performed: Procedure(s): Lumbar One-Two, Lumbar Two-Three, Lumbar Three-Four Laminectomy and Foraminotomy (N/A)  Patient Location: PACU  Anesthesia Type:General  Level of Consciousness: awake, alert  and oriented  Airway & Oxygen Therapy: Patient Spontanous Breathing and Patient connected to face mask oxygen  Post-op Assessment: Report given to RN and Post -op Vital signs reviewed and stable  Post vital signs: Reviewed and stable  Last Vitals:  Vitals:   03/22/16 0947  BP: (!) 144/92  Pulse: 96  Resp: 18  Temp: 37.6 C    Last Pain:  Vitals:   03/22/16 0947  TempSrc: Oral  PainSc: 5       Patients Stated Pain Goal: 2 (03/22/16 0947)  Complications: No apparent anesthesia complications

## 2016-03-23 MED ORDER — CYCLOBENZAPRINE HCL 10 MG PO TABS: 10.0000 mg | ORAL_TABLET | Freq: Three times a day (TID) | ORAL | 0 refills | Status: DC | PRN

## 2016-03-23 MED ORDER — HYDROCODONE-ACETAMINOPHEN 5-325 MG PO TABS: 1.0000 | ORAL_TABLET | ORAL | 0 refills | Status: DC | PRN

## 2016-03-23 NOTE — Anesthesia Postprocedure Evaluation (Addendum)
Anesthesia Post Note  Patient: Luna GlasgowFloyd N Dargis  Procedure(s) Performed: Procedure(s) (LRB): Lumbar One-Two, Lumbar Two-Three, Lumbar Three-Four Laminectomy and Foraminotomy (N/A)  Patient location during evaluation: PACU Anesthesia Type: General Level of consciousness: awake and alert Pain management: pain level controlled Vital Signs Assessment: post-procedure vital signs reviewed and stable Respiratory status: spontaneous breathing, nonlabored ventilation, respiratory function stable and patient connected to nasal cannula oxygen Cardiovascular status: blood pressure returned to baseline and stable Postop Assessment: no signs of nausea or vomiting Anesthetic complications: no       Last Vitals:  Vitals:   03/23/16 0458 03/23/16 0810  BP: 111/64 115/76  Pulse: 89 77  Resp: 17 16  Temp: 36.7 C 36.7 C    Last Pain:  Vitals:   03/23/16 0829  TempSrc:   PainSc: 6                  Sederick Jacobsen

## 2016-03-23 NOTE — Discharge Summary (Signed)
Physician Discharge Summary  Patient ID: Kurt [name] MRN: [id number] DOB/AGE: 1950/12/19 66 y.o.  Admit date: 03/22/2016 Discharge date: 03/23/2016  Admission Diagnoses:Lumbar stenosis with neurogenic claudication  Discharge Diagnoses:  Active Problems:   Lumbar stenosis with neurogenic claudication   Discharged Condition: good  Hospital Course: Kurt [name] was admitted and taken to the operating room for an uncomplicated lumbar laminectomy for decompression L1-L3. Post op he is ambulating, voiding, and tolerating a regular diet. His wound is clean, dry, and without signs of infection.  Treatments: surgery: Right L1-L2 laminotomy and foraminotomy, bilateral L2-3 laminotomy and foraminotomies   Discharge Exam: Blood pressure 115/76, pulse 77, temperature 98.1 F (36.7 C), resp. rate 16, height 5\' 10"  (1.778 m), weight 130.2 kg (287 lb), SpO2 96 %. General appearance: alert, cooperative, appears stated age and mild distress Incision/Wound:clean, dry Moving all extremities well  Disposition: 01-Home or Self Care Stenosis lumbar  Allergies as of 03/23/2016      Reactions   No Known Allergies       Medication List    TAKE these medications   acetaminophen 650 MG CR tablet Commonly known as:  TYLENOL Take 1,300 mg by mouth every 8 (eight) hours as needed for pain.   cyclobenzaprine 10 MG tablet Commonly known as:  FLEXERIL Take 1 tablet (10 mg total) by mouth 3 (three) times daily as needed for muscle spasms.   fluticasone 50 MCG/ACT nasal spray Commonly known as:  FLONASE Place 2 sprays into both nostrils daily as needed for allergies or rhinitis.   furosemide 80 MG tablet Commonly known as:  LASIX Take 40 mg by mouth 2 (two) times daily.   gabapentin 300 MG capsule Commonly known as:  NEURONTIN Take 300 mg by mouth 3 (three) times daily.   HYDROcodone-acetaminophen 5-325 MG tablet Commonly known as:  NORCO/VICODIN Take 1 tablet by mouth every 4 (four) hours as  needed (breakthrough pain).   lisinopril 20 MG tablet Commonly known as:  PRINIVIL,ZESTRIL Take 20 mg by mouth daily.   lovastatin 20 MG tablet Commonly known as:  MEVACOR Take 20 mg by mouth at bedtime.   MURINE TEARS FOR DRY EYES OP Place 2 drops into both eyes as needed (for dry eyes).   oxyCODONE-acetaminophen 5-325 MG tablet Commonly known as:  PERCOCET/ROXICET Take 1-2 tablets by mouth every 4 (four) hours as needed for moderate pain.   ranitidine 150 MG tablet Commonly known as:  ZANTAC Take 150 mg by mouth 2 (two) times daily as needed for heartburn.   sertraline 100 MG tablet Commonly known as:  ZOLOFT Take 1 tablet (100 mg total) by mouth daily. What changed:  how much to take  when to take this   traMADol 50 MG tablet Commonly known as:  ULTRAM Take 1 tablet (50 mg total) by mouth every 6 (six) hours as needed (moderate pain).   zolpidem 5 MG tablet Commonly known as:  AMBIEN Take 5 mg by mouth at bedtime as needed for sleep.      Follow-up Information    POOL,HENRY A, MD Follow up in 2 week(s).   Specialty:  Neurosurgery Contact information: [address] [place] [address] [phone]           Signed: Carmela HurtCABBELL,Duell Holdren L 03/23/2016, 8:21 AM

## 2016-03-23 NOTE — Progress Notes (Addendum)
Patient alert and oriented, mae's well, voiding adequate amount of urine, swallowing without difficulty, No c/o pain. Patient discharged home with family. Script and discharged instructions given to patient. Patient and family stated understanding of d/c instructions given and has an appointment with Dr. Jordan LikesPool

## 2016-03-23 NOTE — Discharge Instructions (Signed)

## 2016-03-24 ENCOUNTER — Encounter (HOSPITAL_COMMUNITY): Payer: Self-pay | Admitting: Neurosurgery

## 2016-06-24 NOTE — Addendum Note (Signed)
Addendum  created 06/24/16 1156 by Kimarie Coor, MD   Sign clinical note    

## 2017-03-28 ENCOUNTER — Other Ambulatory Visit: Payer: Self-pay | Admitting: Family Medicine

## 2017-03-28 ENCOUNTER — Ambulatory Visit
Admission: RE | Admit: 2017-03-28 | Discharge: 2017-03-28 | Disposition: A | Discharge status: Home / Self Care | Payer: Medicare Other | Source: Ambulatory Visit | Attending: Family Medicine | Admitting: Family Medicine

## 2017-03-28 DIAGNOSIS — R52 Pain, unspecified: Secondary | ICD-10-CM

## 2017-03-28 DIAGNOSIS — R609 Edema, unspecified: Secondary | ICD-10-CM

## 2017-04-10 ENCOUNTER — Other Ambulatory Visit: Payer: Self-pay | Admitting: Family Medicine

## 2017-04-10 DIAGNOSIS — M25571 Pain in right ankle and joints of right foot: Secondary | ICD-10-CM

## 2017-05-05 ENCOUNTER — Ambulatory Visit
Admission: RE | Admit: 2017-05-05 | Discharge: 2017-05-05 | Disposition: A | Discharge status: Home / Self Care | Payer: Medicare Other | Source: Ambulatory Visit | Attending: Family Medicine | Admitting: Family Medicine

## 2017-05-05 DIAGNOSIS — M25571 Pain in right ankle and joints of right foot: Secondary | ICD-10-CM

## 2017-05-05 MED ORDER — GADOBENATE DIMEGLUMINE 529 MG/ML IV SOLN
20.0000 mL | Freq: Once | INTRAVENOUS | Status: AC | PRN
  Administered 2017-05-05: 20 mL via INTRAVENOUS

## 2017-09-08 ENCOUNTER — Other Ambulatory Visit: Payer: Self-pay

## 2017-09-08 ENCOUNTER — Emergency Department (HOSPITAL_COMMUNITY)
Admission: EM | Admit: 2017-09-08 | Discharge: 2017-09-09 | Disposition: A | Discharge status: Home / Self Care | Payer: Medicare Other | Attending: Emergency Medicine | Admitting: Emergency Medicine

## 2017-09-08 ENCOUNTER — Encounter (HOSPITAL_COMMUNITY): Payer: Self-pay | Admitting: Emergency Medicine

## 2017-09-08 DIAGNOSIS — N189 Chronic kidney disease, unspecified: Secondary | ICD-10-CM | POA: Diagnosis not present

## 2017-09-08 DIAGNOSIS — S3991XA Unspecified injury of abdomen, initial encounter: Secondary | ICD-10-CM | POA: Diagnosis not present

## 2017-09-08 DIAGNOSIS — S0083XA Contusion of other part of head, initial encounter: Secondary | ICD-10-CM | POA: Insufficient documentation

## 2017-09-08 DIAGNOSIS — S20212A Contusion of left front wall of thorax, initial encounter: Secondary | ICD-10-CM | POA: Diagnosis not present

## 2017-09-08 DIAGNOSIS — Y939 Activity, unspecified: Secondary | ICD-10-CM | POA: Insufficient documentation

## 2017-09-08 DIAGNOSIS — Z79899 Other long term (current) drug therapy: Secondary | ICD-10-CM | POA: Diagnosis not present

## 2017-09-08 DIAGNOSIS — S20211A Contusion of right front wall of thorax, initial encounter: Secondary | ICD-10-CM | POA: Diagnosis not present

## 2017-09-08 DIAGNOSIS — I130 Hypertensive heart and chronic kidney disease with heart failure and stage 1 through stage 4 chronic kidney disease, or unspecified chronic kidney disease: Secondary | ICD-10-CM | POA: Diagnosis not present

## 2017-09-08 DIAGNOSIS — Y999 Unspecified external cause status: Secondary | ICD-10-CM | POA: Insufficient documentation

## 2017-09-08 DIAGNOSIS — Y92018 Other place in single-family (private) house as the place of occurrence of the external cause: Secondary | ICD-10-CM | POA: Diagnosis not present

## 2017-09-08 DIAGNOSIS — S8392XA Sprain of unspecified site of left knee, initial encounter: Secondary | ICD-10-CM | POA: Diagnosis not present

## 2017-09-08 DIAGNOSIS — I509 Heart failure, unspecified: Secondary | ICD-10-CM | POA: Insufficient documentation

## 2017-09-08 DIAGNOSIS — S0990XA Unspecified injury of head, initial encounter: Secondary | ICD-10-CM | POA: Diagnosis present

## 2017-09-08 DIAGNOSIS — S20219A Contusion of unspecified front wall of thorax, initial encounter: Secondary | ICD-10-CM
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
## 2017-09-09 ENCOUNTER — Emergency Department (HOSPITAL_COMMUNITY): Payer: Medicare Other

## 2017-09-09 LAB — COMPREHENSIVE METABOLIC PANEL
ALBUMIN: 3.7 g/dL (ref 3.5–5.0)
ALK PHOS: 89 U/L (ref 38–126)
ALT: 16 U/L (ref 0–44)
AST: 25 U/L (ref 15–41)
Anion gap: 11 (ref 5–15)
BILIRUBIN TOTAL: 0.6 mg/dL (ref 0.3–1.2)
BUN: 25 mg/dL — AB (ref 8–23)
CALCIUM: 8.8 mg/dL — AB (ref 8.9–10.3)
CO2: 27 mmol/L (ref 22–32)
CREATININE: 1.76 mg/dL — AB (ref 0.61–1.24)
Chloride: 100 mmol/L (ref 98–111)
GFR calc Af Amer: 44 mL/min — ABNORMAL LOW (ref >60)
GFR calc non Af Amer: 38 mL/min — ABNORMAL LOW (ref >60)
GLUCOSE: 139 mg/dL — AB (ref 70–99)
Potassium: 3.6 mmol/L (ref 3.5–5.1)
SODIUM: 138 mmol/L (ref 135–145)
Total Protein: 6.5 g/dL (ref 6.5–8.1)

## 2017-09-09 LAB — CBC WITH DIFFERENTIAL/PLATELET
ABS IMMATURE GRANULOCYTES: 0.1 10*3/uL (ref 0.0–0.1)
BASOS ABS: 0.1 10*3/uL (ref 0.0–0.1)
Basophils Relative: 0 %
Eosinophils Absolute: 0.3 10*3/uL (ref 0.0–0.7)
Eosinophils Relative: 2 %
HEMATOCRIT: 41.9 % (ref 39.0–52.0)
HEMOGLOBIN: 13.2 g/dL (ref 13.0–17.0)
Immature Granulocytes: 0 %
LYMPHS ABS: 0.9 10*3/uL (ref 0.7–4.0)
LYMPHS PCT: 6 %
MCH: 28.4 pg (ref 26.0–34.0)
MCHC: 31.5 g/dL (ref 30.0–36.0)
MCV: 90.1 fL (ref 78.0–100.0)
MONO ABS: 0.9 10*3/uL (ref 0.1–1.0)
Monocytes Relative: 6 %
NEUTROS ABS: 12.7 10*3/uL — AB (ref 1.7–7.7)
Neutrophils Relative %: 86 %
Platelets: 249 10*3/uL (ref 150–400)
RBC: 4.65 MIL/uL (ref 4.22–5.81)
RDW: 13.4 % (ref 11.5–15.5)
WBC: 14.9 10*3/uL — AB (ref 4.0–10.5)

## 2017-09-09 MED ORDER — IOPAMIDOL (ISOVUE-300) INJECTION 61%
100.0000 mL | Freq: Once | INTRAVENOUS | Status: AC | PRN
  Administered 2017-09-09: 80 mL via INTRAVENOUS

## 2017-09-09 MED ORDER — MORPHINE SULFATE (PF) 4 MG/ML IV SOLN
4.0000 mg | Freq: Once | INTRAVENOUS | Status: AC
  Administered 2017-09-09: 4 mg via INTRAVENOUS
  Filled 2017-09-09: qty 1

## 2017-09-09 MED ORDER — FLUORESCEIN SODIUM 1 MG OP STRP
1.0000 | ORAL_STRIP | Freq: Once | OPHTHALMIC | Status: AC
  Administered 2017-09-09: 1 via OPHTHALMIC
  Filled 2017-09-09: qty 1

## 2017-09-09 MED ORDER — ONDANSETRON HCL 4 MG/2ML IJ SOLN
4.0000 mg | Freq: Once | INTRAMUSCULAR | Status: AC
  Administered 2017-09-09: 4 mg via INTRAVENOUS
  Filled 2017-09-09: qty 2

## 2017-09-09 MED ORDER — TETRACAINE HCL 0.5 % OP SOLN
1.0000 [drp] | Freq: Once | OPHTHALMIC | Status: AC
  Administered 2017-09-09: 1 [drp] via OPHTHALMIC
  Filled 2017-09-09: qty 4

## 2017-09-09 MED ORDER — HYDROCODONE-ACETAMINOPHEN 5-325 MG PO TABS: 1.0000 | ORAL_TABLET | Freq: Four times a day (QID) | ORAL | 0 refills | Status: DC | PRN

## 2017-09-09 MED ORDER — IOPAMIDOL (ISOVUE-300) INJECTION 61%
INTRAVENOUS | Status: AC
  Filled 2017-09-09: qty 100

## 2017-09-09 NOTE — ED Notes (Signed)
Pt stable, ambulatory, and verbalizes understanding of d/c instructions.   Pt unable to sign due to non working signature pad
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
## 2017-09-09 NOTE — ED Notes (Signed)
Pt ambulated from stretcher to chair with knee immobilizer. Pt having slight difficulty due to stiffness and pain.

## 2017-09-09 NOTE — ED Notes (Signed)
Patient transported to MRI 

## 2017-09-09 NOTE — ED Notes (Signed)
Pt placed on 2L [place] due to O2 sats dropping to 84%

## 2017-09-09 NOTE — ED Notes (Signed)
Patient transported to X-ray 

## 2017-09-09 NOTE — ED Notes (Signed)
Pt given Ginger-Ale  Tolerating well
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
## 2017-09-09 NOTE — ED Notes (Signed)
Pt remains in mri.

## 2018-05-22 DIAGNOSIS — K269 Duodenal ulcer, unspecified as acute or chronic, without hemorrhage or perforation: Secondary | ICD-10-CM

## 2018-05-22 DIAGNOSIS — K922 Gastrointestinal hemorrhage, unspecified: Secondary | ICD-10-CM

## 2018-05-22 HISTORY — DX: Gastrointestinal hemorrhage, unspecified: K92.2

## 2018-05-22 HISTORY — DX: Duodenal ulcer, unspecified as acute or chronic, without hemorrhage or perforation: K26.9

## 2018-06-14 ENCOUNTER — Encounter (HOSPITAL_COMMUNITY): Payer: Self-pay | Admitting: Emergency Medicine

## 2018-06-14 ENCOUNTER — Inpatient Hospital Stay (HOSPITAL_COMMUNITY)
Admission: EM | Admit: 2018-06-14 | Discharge: 2018-06-18 | DRG: 378 | Disposition: A | Discharge status: Home / Self Care | Payer: Medicare Other | Attending: Internal Medicine | Admitting: Internal Medicine

## 2018-06-14 DIAGNOSIS — I951 Orthostatic hypotension: Secondary | ICD-10-CM | POA: Diagnosis present

## 2018-06-14 DIAGNOSIS — I130 Hypertensive heart and chronic kidney disease with heart failure and stage 1 through stage 4 chronic kidney disease, or unspecified chronic kidney disease: Secondary | ICD-10-CM | POA: Diagnosis present

## 2018-06-14 DIAGNOSIS — K210 Gastro-esophageal reflux disease with esophagitis: Secondary | ICD-10-CM | POA: Diagnosis present

## 2018-06-14 DIAGNOSIS — N183 Chronic kidney disease, stage 3 (moderate): Secondary | ICD-10-CM | POA: Diagnosis present

## 2018-06-14 DIAGNOSIS — I5032 Chronic diastolic (congestive) heart failure: Secondary | ICD-10-CM | POA: Diagnosis present

## 2018-06-14 DIAGNOSIS — D72829 Elevated white blood cell count, unspecified: Secondary | ICD-10-CM | POA: Diagnosis not present

## 2018-06-14 DIAGNOSIS — Z791 Long term (current) use of non-steroidal anti-inflammatories (NSAID): Secondary | ICD-10-CM

## 2018-06-14 DIAGNOSIS — G4733 Obstructive sleep apnea (adult) (pediatric): Secondary | ICD-10-CM | POA: Diagnosis present

## 2018-06-14 DIAGNOSIS — N179 Acute kidney failure, unspecified: Secondary | ICD-10-CM | POA: Diagnosis present

## 2018-06-14 DIAGNOSIS — D649 Anemia, unspecified: Secondary | ICD-10-CM | POA: Diagnosis present

## 2018-06-14 DIAGNOSIS — Z9181 History of falling: Secondary | ICD-10-CM

## 2018-06-14 DIAGNOSIS — K260 Acute duodenal ulcer with hemorrhage: Principal | ICD-10-CM | POA: Diagnosis present

## 2018-06-14 DIAGNOSIS — Z1159 Encounter for screening for other viral diseases: Secondary | ICD-10-CM

## 2018-06-14 DIAGNOSIS — M549 Dorsalgia, unspecified: Secondary | ICD-10-CM | POA: Diagnosis present

## 2018-06-14 DIAGNOSIS — K449 Diaphragmatic hernia without obstruction or gangrene: Secondary | ICD-10-CM | POA: Diagnosis present

## 2018-06-14 DIAGNOSIS — Z6841 Body Mass Index (BMI) 40.0 and over, adult: Secondary | ICD-10-CM

## 2018-06-14 DIAGNOSIS — M1990 Unspecified osteoarthritis, unspecified site: Secondary | ICD-10-CM | POA: Diagnosis present

## 2018-06-14 DIAGNOSIS — F329 Major depressive disorder, single episode, unspecified: Secondary | ICD-10-CM | POA: Diagnosis present

## 2018-06-14 DIAGNOSIS — D62 Acute posthemorrhagic anemia: Secondary | ICD-10-CM | POA: Diagnosis present

## 2018-06-14 DIAGNOSIS — Z79899 Other long term (current) drug therapy: Secondary | ICD-10-CM

## 2018-06-14 DIAGNOSIS — K2950 Unspecified chronic gastritis without bleeding: Secondary | ICD-10-CM | POA: Diagnosis present

## 2018-06-14 DIAGNOSIS — Z79891 Long term (current) use of opiate analgesic: Secondary | ICD-10-CM

## 2018-06-14 DIAGNOSIS — G8929 Other chronic pain: Secondary | ICD-10-CM | POA: Diagnosis present

## 2018-06-14 DIAGNOSIS — Z8711 Personal history of peptic ulcer disease: Secondary | ICD-10-CM

## 2018-06-14 DIAGNOSIS — T39315A Adverse effect of propionic acid derivatives, initial encounter: Secondary | ICD-10-CM | POA: Diagnosis present

## 2018-06-14 DIAGNOSIS — X58XXXA Exposure to other specified factors, initial encounter: Secondary | ICD-10-CM | POA: Diagnosis present

## 2018-06-14 DIAGNOSIS — K921 Melena: Secondary | ICD-10-CM | POA: Diagnosis present

## 2018-06-14 DIAGNOSIS — Z8673 Personal history of transient ischemic attack (TIA), and cerebral infarction without residual deficits: Secondary | ICD-10-CM

## 2018-06-14 DIAGNOSIS — R9431 Abnormal electrocardiogram [ECG] [EKG]: Secondary | ICD-10-CM | POA: Diagnosis present

## 2018-06-14 DIAGNOSIS — K922 Gastrointestinal hemorrhage, unspecified: Secondary | ICD-10-CM | POA: Diagnosis present

## 2018-06-14 LAB — COMPREHENSIVE METABOLIC PANEL
ALT: 13 U/L (ref 0–44)
AST: 14 U/L — ABNORMAL LOW (ref 15–41)
Albumin: 3.5 g/dL (ref 3.5–5.0)
Alkaline Phosphatase: 57 U/L (ref 38–126)
Anion gap: 12 (ref 5–15)
BUN: 86 mg/dL — ABNORMAL HIGH (ref 8–23)
CO2: 25 mmol/L (ref 22–32)
Calcium: 8.5 mg/dL — ABNORMAL LOW (ref 8.9–10.3)
Chloride: 97 mmol/L — ABNORMAL LOW (ref 98–111)
Creatinine, Ser: 2.02 mg/dL — ABNORMAL HIGH (ref 0.61–1.24)
GFR calc Af Amer: 38 mL/min — ABNORMAL LOW (ref >60)
GFR calc non Af Amer: 33 mL/min — ABNORMAL LOW (ref >60)
Glucose, Bld: 120 mg/dL — ABNORMAL HIGH (ref 70–99)
Potassium: 3.7 mmol/L (ref 3.5–5.1)
Sodium: 134 mmol/L — ABNORMAL LOW (ref 135–145)
Total Bilirubin: 0.6 mg/dL (ref 0.3–1.2)
Total Protein: 6.3 g/dL — ABNORMAL LOW (ref 6.5–8.1)

## 2018-06-14 LAB — CBC
HCT: 33.7 % — ABNORMAL LOW (ref 39.0–52.0)
Hemoglobin: 10.6 g/dL — ABNORMAL LOW (ref 13.0–17.0)
MCH: 28.6 pg (ref 26.0–34.0)
MCHC: 31.5 g/dL (ref 30.0–36.0)
MCV: 91.1 fL (ref 80.0–100.0)
Platelets: 308 10*3/uL (ref 150–400)
RBC: 3.7 MIL/uL — ABNORMAL LOW (ref 4.22–5.81)
RDW: 13.9 % (ref 11.5–15.5)
WBC: 15.2 10*3/uL — ABNORMAL HIGH (ref 4.0–10.5)
nRBC: 0 % (ref 0.0–0.2)

## 2018-06-14 NOTE — ED Triage Notes (Signed)
Pt in POV, reports dark tarry stools X few days with gen weakness. Denies abd pain, N/V/D. Pt states he was hypotensive at home with pressures in the 70's. Reports hx of same.

## 2018-06-14 NOTE — ED Provider Notes (Addendum)
[facility]  CSN: [id number] Arrival date & time: 06/14/18 2141  Chief Complaint(s) Rectal Bleeding  HPI Kurt [name] is a 68 y.o. male with a history of diverticulosis and peptic ulcer disease from chronic NSAID use who presents to the emergency department with gradual fatigue for several days which is exacerbated with activity and relieved by rest.  Patient also had 2 episodes of melena today.  Patient reports that he would normally takes 9 naproxen's daily but has cut back to taking 2 naproxen's per day last year after staring to take CBD.  Last bowel movement was 1400.  He denies any associated chest pain or shortness of breath.  No abdominal pain.  He denies any recent fevers or infections.  Not on any blood thinners.  The history is provided by the patient.    Past Medical History Past Medical History:  Diagnosis Date  . Arthritis   . CHF (congestive heart failure) (HCC)    diastolic dysfunction  . Chronic back pain   . Depression   . Diverticulitis   . Hypertension   . PTSD (post-traumatic stress disorder)    after  a Fall   . Renal insufficiency    Decrease in GFR- PCP removed all NSAIDS & aspirin   . TIA (transient ischemic attack) 2014   patient denies states was [place]   Patient Active Problem List   Diagnosis Date Noted  . 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 GI bleed 04/29/2014  . AKI (acute kidney injury) (HCC) 04/29/2014  . GI bleeding 04/29/2014  . TIA (transient ischemic attack) 07/08/2012  . Diverticulitis 07/08/2012  . Hypertension 07/08/2012   Home Medication(s) Prior to Admission medications   Medication Sig Start Date End Date Taking? Authorizing Provider  acetaminophen (TYLENOL) 650 MG CR tablet Take 1,300 mg by mouth every 8 (eight) hours as needed for pain.    [provider]   cyclobenzaprine (FLEXERIL) 10 MG tablet Take 1 tablet (10 mg total) by mouth 3 (three) times daily as needed for muscle spasms. 03/23/16   Coletta Memosabbell, Kyle, MD  fluticasone (FLONASE) 50 MCG/ACT nasal spray Place 2 sprays into both nostrils daily as needed for allergies or rhinitis.    [provider]  furosemide (LASIX) 80 MG tablet Take 40 mg by mouth 2 (two) times daily.    [provider]  gabapentin (NEURONTIN) 300 MG capsule Take 300 mg by mouth 3 (three) times daily.    [provider]  HYDROcodone-acetaminophen (NORCO/VICODIN) 5-325 MG tablet Take 1-2 tablets by mouth every 6 (six) hours as needed (breakthrough pain). 09/09/17   Rolan BuccoBelfi, Melanie, MD  lisinopril (PRINIVIL,ZESTRIL) 20 MG tablet Take 20 mg by mouth daily. 12/29/14   [provider]  lovastatin (MEVACOR) 20 MG tablet Take 20 mg by mouth at bedtime.    [provider]  Polyvinyl Alcohol-Povidone (MURINE TEARS FOR DRY EYES OP) Place 2 drops into both eyes as needed (for dry eyes).    [provider]  ranitidine (ZANTAC) 150 MG tablet Take 150 mg by mouth 2 (two) times daily as needed for heartburn.  12/29/14   [provider]  sertraline (ZOLOFT) 100 MG tablet Take 1 tablet (100 mg total) by mouth daily. Patient taking differently: Take 200 mg by mouth daily before breakfast.  07/11/12   Drema DallasWoods, Curtis J, MD  traMADol (ULTRAM) 50 MG tablet Take 1 tablet (  50 mg total) by mouth every 6 (six) hours as needed (moderate pain). Patient not taking: Reported on 03/14/2016 05/01/14   Edsel Petrin, DO  zolpidem (AMBIEN) 5 MG tablet Take 5 mg by mouth at bedtime as needed for sleep.    [provider]                                                                                                                                    Past Surgical History Past Surgical History:  Procedure Laterality Date  . ELBOW FRACTURE SURGERY     ORIF, Shoulder manipulation    .  ESOPHAGOGASTRODUODENOSCOPY N/A 04/30/2014   Procedure: ESOPHAGOGASTRODUODENOSCOPY (EGD);  Surgeon: Jeani Hawking, MD;  Location: [facility] ENDOSCOPY;  Service: Endoscopy;  Laterality: N/A;  . LUMBAR LAMINECTOMY/DECOMPRESSION MICRODISCECTOMY N/A 02/16/2015   Procedure: Thoracic ten - thoracic twelve laminectomy;  Surgeon: Julio Sicks, MD;  Location: [facility] NEURO ORS;  Service: Neurosurgery;  Laterality: N/A;  . LUMBAR LAMINECTOMY/DECOMPRESSION MICRODISCECTOMY N/A 03/22/2016   Procedure: Lumbar One-Two, Lumbar Two-Three, Lumbar Three-Four Laminectomy and Foraminotomy;  Surgeon: Julio Sicks, MD;  Location: [facility] OR;  Service: Neurosurgery;  Laterality: N/A;  . NASAL SEPTUM SURGERY     Family History No family history on file.  Social History Social History   Tobacco Use  . Smoking status: Never Smoker  . Smokeless tobacco: Never Used  Substance Use Topics  . Alcohol use: Yes    Comment: occ   quit 1989   . Drug use: No   Allergies No known allergies  Review of Systems Review of Systems All other systems are reviewed and are negative for acute change except as noted in the HPI  Physical Exam Vital Signs  I have reviewed the triage vital signs BP 92/60 (BP Location: Left Arm)   Pulse (!) 111   Temp 98.2 F (36.8 C) (Oral)   Resp 18   Ht  (1.778 m)   Wt 133.8 kg   SpO2 95%   BMI 42.33 kg/m   Physical Exam Vitals signs reviewed.  Constitutional:      General: He is not in acute distress.    Appearance: He is well-developed. He is obese. He is not diaphoretic.  HENT:     Head: Normocephalic and atraumatic.     Nose: Nose normal.  Eyes:     General: No scleral icterus.       Right eye: No discharge.        Left eye: No discharge.     Conjunctiva/sclera: Conjunctivae normal.     Pupils: Pupils are equal, round, and reactive to light.  Neck:     Musculoskeletal: Normal range of motion and neck supple.  Cardiovascular:     Rate and Rhythm: Normal rate and regular rhythm.     Heart  sounds: No murmur. No friction rub. No gallop.   Pulmonary:     Effort: Pulmonary effort is normal.  No respiratory distress.     Breath sounds: Normal breath sounds. No stridor. No rales.  Abdominal:     General: There is no distension.     Palpations: Abdomen is soft.     Tenderness: There is no abdominal tenderness.  Genitourinary:    Rectum: Guaiac result positive.  Musculoskeletal:        General: No tenderness.  Skin:    General: Skin is warm and dry.     Findings: No erythema or rash.  Neurological:     Mental Status: He is alert and oriented to person, place, and time.     ED Results and Treatments Labs (all labs ordered are listed, but only abnormal results are displayed) Labs Reviewed  COMPREHENSIVE METABOLIC PANEL - Abnormal; Notable for the following components:      Result Value   Sodium 134 (*)    Chloride 97 (*)    Glucose, Bld 120 (*)    BUN 86 (*)    Creatinine, Ser 2.02 (*)    Calcium 8.5 (*)    Total Protein 6.3 (*)    AST 14 (*)    GFR calc non Af Amer 33 (*)    GFR calc Af Amer 38 (*)    All other components within normal limits  CBC - Abnormal; Notable for the following components:   WBC 15.2 (*)    RBC 3.70 (*)    Hemoglobin 10.6 (*)    HCT 33.7 (*)    All other components within normal limits  POC OCCULT BLOOD, ED - Abnormal; Notable for the following components:   Fecal Occult Bld POSITIVE (*)    All other components within normal limits  PROTIME-INR  TYPE AND SCREEN                                                                                                                         EKG  EKG Interpretation  Date/Time:  Sunday Jun 14 2018 21:51:25 EDT Ventricular Rate:  113 PR Interval:  136 QRS Duration: 86 QT Interval:  366 QTC Calculation: 502 R Axis:   93 Text Interpretation:  Sinus tachycardia Rightward axis Borderline ECG No significant change since last tracing Confirmed by Drema Pry [id number]) on 06/14/2018 11:20:28 PM       Radiology No results found. Pertinent labs & imaging results that were available during my care of the patient were reviewed by me and considered in my medical decision making (see chart for details).  Medications Ordered in ED Medications  sodium chloride 0.9 % bolus 1,000 mL (has no administration in time range)    And  sodium chloride 0.9 % bolus 1,000 mL (has no administration in time range)    And  0.9 %  sodium chloride infusion (has no administration in time range)  pantoprazole (PROTONIX) injection 40 mg (has no administration in time range)  Procedures Procedures CRITICAL CARE Performed by: Amadeo Garnet Danne Scardina Total critical care time: 35 minutes Critical care time was exclusive of separately billable procedures and treating other patients. Critical care was necessary to treat or prevent imminent or life-threatening deterioration. Critical care was time spent personally by me on the following activities: development of treatment plan with patient and/or surrogate as well as nursing, discussions with consultants, evaluation of patient's response to treatment, examination of patient, obtaining history from patient or surrogate, ordering and performing treatments and interventions, ordering and review of laboratory studies, ordering and review of radiographic studies, pulse oximetry and re-evaluation of patient's condition.   (including critical care time)  Medical Decision Making / ED Course I have reviewed the nursing notes for this encounter and the patient's prior records (if available in EHR or on provided paperwork).    On review of records, patient had an EGD in 2014 notable for duodenal ulcer.  Patient also known to have colonic diverticulosis.  Given his presentation with melenic stools and chronic NSAID use, I have suspicion for bleeding  ulcer.  Abdomen is benign at this time, so I have a low suspicion for perforated viscus.   Patient does have tachycardia with soft blood pressures and has positive orthostatics dropping down to systolics of 80s.  Will provide patient with IV fluids.  Also given Protonix.  Labs notable for 3 g drop in his hemoglobin. pRBCs ordered.  Patient also noted to have mildly worsening renal insufficiency.   Will discuss case with medicine for admission and GI consultation in the morning.   Final Clinical Impression(s) / ED Diagnoses Final diagnoses:  Leukocytosis  Acute GI bleeding  Acute blood loss anemia      This chart was dictated using voice recognition software.  Despite best efforts to proofread,  errors can occur which can change the documentation meaning.   Nira Conn, MD 06/15/18 5366    Nira Conn, MD 07/06/18 [phone]

## 2018-06-15 ENCOUNTER — Encounter (HOSPITAL_COMMUNITY): Payer: Self-pay | Admitting: Internal Medicine

## 2018-06-15 ENCOUNTER — Observation Stay (HOSPITAL_COMMUNITY): Payer: Medicare Other

## 2018-06-15 ENCOUNTER — Other Ambulatory Visit: Payer: Self-pay

## 2018-06-15 DIAGNOSIS — K922 Gastrointestinal hemorrhage, unspecified: Secondary | ICD-10-CM | POA: Diagnosis not present

## 2018-06-15 DIAGNOSIS — D649 Anemia, unspecified: Secondary | ICD-10-CM | POA: Diagnosis present

## 2018-06-15 DIAGNOSIS — R9431 Abnormal electrocardiogram [ECG] [EKG]: Secondary | ICD-10-CM | POA: Diagnosis present

## 2018-06-15 DIAGNOSIS — D72829 Elevated white blood cell count, unspecified: Secondary | ICD-10-CM | POA: Diagnosis present

## 2018-06-15 LAB — CBC
HCT: 29.3 % — ABNORMAL LOW (ref 39.0–52.0)
HCT: 28.4 % — ABNORMAL LOW (ref 39.0–52.0)
Hemoglobin: 9.5 g/dL — ABNORMAL LOW (ref 13.0–17.0)
Hemoglobin: 9.4 g/dL — ABNORMAL LOW (ref 13.0–17.0)
MCH: 28.9 pg (ref 26.0–34.0)
MCH: 28.7 pg (ref 26.0–34.0)
MCHC: 32.4 g/dL (ref 30.0–36.0)
MCHC: 33.1 g/dL (ref 30.0–36.0)
MCV: 89.1 fL (ref 80.0–100.0)
MCV: 86.9 fL (ref 80.0–100.0)
Platelets: 223 10*3/uL (ref 150–400)
Platelets: 217 10*3/uL (ref 150–400)
RBC: 3.29 MIL/uL — ABNORMAL LOW (ref 4.22–5.81)
RBC: 3.27 MIL/uL — ABNORMAL LOW (ref 4.22–5.81)
RDW: 14.3 % (ref 11.5–15.5)
RDW: 14.5 % (ref 11.5–15.5)
WBC: 11.8 10*3/uL — ABNORMAL HIGH (ref 4.0–10.5)
WBC: 12.5 10*3/uL — ABNORMAL HIGH (ref 4.0–10.5)
nRBC: 0 % (ref 0.0–0.2)
nRBC: 0 % (ref 0.0–0.2)

## 2018-06-15 LAB — URINALYSIS, ROUTINE W REFLEX MICROSCOPIC
Bilirubin Urine: NEGATIVE
Glucose, UA: NEGATIVE mg/dL
Hgb urine dipstick: NEGATIVE
Ketones, ur: NEGATIVE mg/dL
Leukocytes,Ua: NEGATIVE
Nitrite: NEGATIVE
Protein, ur: NEGATIVE mg/dL
Specific Gravity, Urine: 1.014 (ref 1.005–1.030)
pH: 5 (ref 5.0–8.0)

## 2018-06-15 LAB — MAGNESIUM: Magnesium: 2.3 mg/dL (ref 1.7–2.4)

## 2018-06-15 LAB — PROTIME-INR
INR: 1.1 (ref 0.8–1.2)
Prothrombin Time: 13.9 seconds (ref 11.4–15.2)

## 2018-06-15 LAB — SARS CORONAVIRUS 2 BY RT PCR (HOSPITAL ORDER, PERFORMED IN ~~LOC~~ HOSPITAL LAB): SARS Coronavirus 2: NEGATIVE

## 2018-06-15 LAB — MRSA PCR SCREENING: MRSA by PCR: NEGATIVE

## 2018-06-15 LAB — PREPARE RBC (CROSSMATCH)

## 2018-06-15 LAB — POC OCCULT BLOOD, ED: Fecal Occult Bld: POSITIVE — AB

## 2018-06-15 MED ORDER — POTASSIUM CHLORIDE 10 MEQ/100ML IV SOLN
10.0000 meq | INTRAVENOUS | Status: AC
  Administered 2018-06-15 (×3): 10 meq via INTRAVENOUS
  Filled 2018-06-15 (×3): qty 100

## 2018-06-15 MED ORDER — SODIUM CHLORIDE 0.9 % IV BOLUS
1000.0000 mL | Freq: Once | INTRAVENOUS | Status: AC
  Administered 2018-06-15: 01:00:00 1000 mL via INTRAVENOUS

## 2018-06-15 MED ORDER — PANTOPRAZOLE SODIUM 40 MG IV SOLR
40.0000 mg | Freq: Once | INTRAVENOUS | Status: AC
  Administered 2018-06-15: 40 mg via INTRAVENOUS
  Filled 2018-06-15: qty 40

## 2018-06-15 MED ORDER — SODIUM CHLORIDE 0.9% IV SOLUTION
Freq: Once | INTRAVENOUS | Status: AC
  Administered 2018-06-15: 03:00:00 via INTRAVENOUS

## 2018-06-15 MED ORDER — PANTOPRAZOLE SODIUM 40 MG IV SOLR
40.0000 mg | Freq: Once | INTRAVENOUS | Status: AC
  Administered 2018-06-15: 01:00:00 40 mg via INTRAVENOUS
  Filled 2018-06-15: qty 40

## 2018-06-15 MED ORDER — SODIUM CHLORIDE 0.9 % IV SOLN
8.0000 mg/h | INTRAVENOUS | Status: DC
  Administered 2018-06-15 – 2018-06-18 (×7): 8 mg/h via INTRAVENOUS
  Filled 2018-06-15 (×9): qty 80

## 2018-06-15 MED ORDER — SODIUM CHLORIDE 0.9 % IV BOLUS
1000.0000 mL | Freq: Once | INTRAVENOUS | Status: AC
  Administered 2018-06-15: 1000 mL via INTRAVENOUS

## 2018-06-15 MED ORDER — SODIUM CHLORIDE 0.9 % IV SOLN
INTRAVENOUS | Status: DC
  Administered 2018-06-15 – 2018-06-17 (×8): via INTRAVENOUS

## 2018-06-15 MED ORDER — ACETAMINOPHEN 325 MG PO TABS
650.0000 mg | ORAL_TABLET | Freq: Four times a day (QID) | ORAL | Status: DC | PRN
  Administered 2018-06-15 – 2018-06-17 (×3): 650 mg via ORAL
  Filled 2018-06-15 (×3): qty 2
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
## 2018-06-15 NOTE — Consult Note (Signed)
Reason for Consult: GI bleed and history of a bleeding duodenal ulcer Referring Physician: [facility]  UNASSIGNED  Kurt [name] HPI: This is a 68 year old male with a PMH of an NSAID-induced duodenal bulb ulcer with active bleeding on 04/30/2014 s/p BICAP and hemoclipping, HTN, arthritis, and morbid obesity presents with symptomatic anemia and melena.  Three to four days prior to Saturday he was feeling an increasing fatigue, which markedly worsened on Saturday.  On Sunday he had two bouts of melena and the second bout was greater than the first bowel movement.  He presented to the ER and his HGB was noted to be at 10.6 g/dL, which dropped down to 9.5 g/dL with IV hydration.  His baseline HGB is in the 13 g/dL range.  On 04/2014 he presented in the same manner and an EGD was performed by Dr. Elnoria Howard as an unassigned patient.  At that time he was taking a significant amount of NSAIDs as he had a fall many years ago and he suffers from arthritis.  He was instructed to avoid NSAIDs after the procedure, but he reports using Aleve BID.  The patient never followed up in the office after his discharge.  Past Medical History:  Diagnosis Date  . Arthritis   . CHF (congestive heart failure) (HCC)    diastolic dysfunction  . Chronic back pain   . Depression   . Diverticulitis   . Hypertension   . PTSD (post-traumatic stress disorder)    after  a Fall   . Renal insufficiency    Decrease in GFR- PCP removed all NSAIDS & aspirin   . TIA (transient ischemic attack) 2014   patient denies states was [place]    Past Surgical History:  Procedure Laterality Date  . ELBOW FRACTURE SURGERY     ORIF, Shoulder manipulation    . ESOPHAGOGASTRODUODENOSCOPY N/A 04/30/2014   Procedure: ESOPHAGOGASTRODUODENOSCOPY (EGD);  Surgeon: Jeani Hawking, MD;  Location: [facility] ENDOSCOPY;  Service: Endoscopy;  Laterality: N/A;  . LUMBAR LAMINECTOMY/DECOMPRESSION MICRODISCECTOMY N/A 02/16/2015   Procedure: Thoracic ten - thoracic  twelve laminectomy;  Surgeon: Julio Sicks, MD;  Location: [facility] NEURO ORS;  Service: Neurosurgery;  Laterality: N/A;  . LUMBAR LAMINECTOMY/DECOMPRESSION MICRODISCECTOMY N/A 03/22/2016   Procedure: Lumbar One-Two, Lumbar Two-Three, Lumbar Three-Four Laminectomy and Foraminotomy;  Surgeon: Julio Sicks, MD;  Location[facility] OR;  Service: Neurosurgery;  Laterality: N/A;  . NASAL SEPTUM SURGERY      No family history on file.  Social History:  reports that he has never smoked. He has never used smokeless tobacco. He reports previous alcohol use. He reports that he does not use drugs.  Allergies:  Allergies  Allergen Reactions  . No Known Allergies     Medications:  Scheduled:  Continuous: . sodium chloride 125 mL/hr at 06/15/18 0327  . pantoprozole (PROTONIX) infusion 8 mg/hr (06/15/18 0341)    Results for orders placed or performed during the hospital encounter of 06/14/18 (from the past 24 hour(s))  Type and screen [facility]     Status: None (Preliminary result)   Collection Time: 06/14/18  9:50 PM  Result Value Ref Range   ABO/RH(D) A POS    Antibody Screen NEG    Sample Expiration 06/17/2018,2359    Unit Number [id number]    Blood Component Type RED CELLS,LR    Unit division 00    Status of Unit ISSUED    Transfusion Status OK TO TRANSFUSE    Crossmatch Result  Compatible Performed at [facility], [address] [place], [place] [address]   Comprehensive metabolic panel     Status: Abnormal   Collection Time: 06/14/18  9:53 PM  Result Value Ref Range   Sodium 134 (L) 135 - 145 mmol/L   Potassium 3.7 3.5 - 5.1 mmol/L   Chloride 97 (L) 98 - 111 mmol/L   CO2 25 22 - 32 mmol/L   Glucose, Bld 120 (H) 70 - 99 mg/dL   BUN 86 (H) 8 - 23 mg/dL   Creatinine, Ser 2.02 (H) 0.61 - 1.24 mg/dL   Calcium 8.5 (L) 8.9 - 10.3 mg/dL   Total Protein 6.3 (L) 6.5 - 8.1 g/dL   Albumin 3.5 3.5 - 5.0 g/dL   AST 14 (L) 15 - 41 U/L   ALT 13 0 - 44 U/L   Alkaline  Phosphatase 57 38 - 126 U/L   Total Bilirubin 0.6 0.3 - 1.2 mg/dL   GFR calc non Af Amer 33 (L) >60 mL/min   GFR calc Af Amer 38 (L) >60 mL/min   Anion gap 12 5 - 15  CBC     Status: Abnormal   Collection Time: 06/14/18  9:53 PM  Result Value Ref Range   WBC 15.2 (H) 4.0 - 10.5 K/uL   RBC 3.70 (L) 4.22 - 5.81 MIL/uL   Hemoglobin 10.6 (L) 13.0 - 17.0 g/dL   HCT 33.7 (L) 39.0 - 52.0 %   MCV 91.1 80.0 - 100.0 fL   MCH 28.6 26.0 - 34.0 pg   MCHC 31.5 30.0 - 36.0 g/dL   RDW 13.9 11.5 - 15.5 %   Platelets 308 150 - 400 K/uL   nRBC 0.0 0.0 - 0.2 %  POC occult blood, ED     Status: Abnormal   Collection Time: 06/15/18 12:07 AM  Result Value Ref Range   Fecal Occult Bld POSITIVE (A) NEGATIVE  Protime-INR     Status: None   Collection Time: 06/15/18 12:10 AM  Result Value Ref Range   Prothrombin Time 13.9 11.4 - 15.2 seconds   INR 1.1 0.8 - 1.2  Prepare RBC     Status: None   Collection Time: 06/15/18  1:10 AM  Result Value Ref Range   Order Confirmation      ORDER PROCESSED BY BLOOD BANK Performed at [facility], [address] , [place] [address]   SARS Coronavirus 2 (CEPHEID - Performed in [place] hospital lab), Hosp Order     Status: None   Collection Time: 06/15/18  1:52 AM  Result Value Ref Range   SARS Coronavirus 2 NEGATIVE NEGATIVE  Magnesium     Status: None   Collection Time: 06/15/18  1:57 AM  Result Value Ref Range   Magnesium 2.3 1.7 - 2.4 mg/dL  Urinalysis, Routine w reflex microscopic     Status: None   Collection Time: 06/15/18  3:50 AM  Result Value Ref Range   Color, Urine YELLOW YELLOW   APPearance CLEAR CLEAR   Specific Gravity, Urine 1.014 1.005 - 1.030   pH 5.0 5.0 - 8.0   Glucose, UA NEGATIVE NEGATIVE mg/dL   Hgb urine dipstick NEGATIVE NEGATIVE   Bilirubin Urine NEGATIVE NEGATIVE   Ketones, ur NEGATIVE NEGATIVE mg/dL   Protein, ur NEGATIVE NEGATIVE mg/dL   Nitrite NEGATIVE NEGATIVE   Leukocytes,Ua NEGATIVE NEGATIVE  MRSA PCR  Screening     Status: None   Collection Time: 06/15/18  5:30 AM    Result Value Ref Range   MRSA by PCR NEGATIVE NEGATIVE  CBC     Status: Abnormal   Collection Time: 06/15/18  7:09 AM  Result Value Ref Range   WBC 11.8 (H) 4.0 - 10.5 K/uL   RBC 3.29 (L) 4.22 - 5.81 MIL/uL   Hemoglobin 9.5 (L) 13.0 - 17.0 g/dL   HCT 16.129.3 (L) 09.639.0 - 04.552.0 %   MCV 89.1 80.0 - 100.0 fL   MCH 28.9 26.0 - 34.0 pg   MCHC 32.4 30.0 - 36.0 g/dL   RDW 40.914.3 81.111.5 - 91.415.5 %   Platelets 223 150 - 400 K/uL   nRBC 0.0 0.0 - 0.2 %     Dg Chest 2 View  Result Date: 06/15/2018 CLINICAL DATA:  Initial evaluation for acute leukocytosis. EXAM: CHEST - 2 VIEW COMPARISON:  Prior radiograph from 04/29/2014 FINDINGS: Cardiac and mediastinal silhouettes are stable in size and contour, and remain within normal limits. Lungs mildly hypoinflated. Mild streaky bibasilar subsegmental atelectasis. No consolidative opacity to suggest pneumonia. No pulmonary edema or definite pleural effusion, although the costophrenic angles are incompletely visualized. No pneumothorax. No acute osseous finding. Degenerative changes noted about the shoulders bilaterally. IMPRESSION: 1. Shallow lung inflation with mild streaky bibasilar subsegmental atelectasis. 2. No other active cardiopulmonary disease. Electronically Signed   By: Rise MuBenjamin  McClintock M.D.   On: 06/15/2018 04:57    ROS:  As stated above in the HPI otherwise negative.  Blood pressure 105/64, pulse 87, temperature 97.7 F (36.5 C), temperature source Oral, resp. rate 18, height 5\' 10"  (1.778 m), weight 133.8 kg, SpO2 100 %.    PE: Gen: NAD, Alert and Oriented HEENT:  [place]/AT, EOMI Neck: Supple, no LAD Lungs: CTA Bilaterally CV: RRR without M/G/R ABM: Soft, NTND, +BS Ext: No C/C/E  Assessment/Plan: 1) GI bleed. 2) History of a bleeding NSAID-induced duodenal bulb ulcer. 3) Anemia.   It is likely that he has an NSAID-induced ulcer again.  He is feeling better with the one unit of  PRBC, but he still feels fatigued.  His BP has improved.  Plan: 1) EGD tomorrow with Dr. Marina GoodellPerry. 2) Follow HGB and transfuse as necessary.  Doral Ventrella D 06/15/2018, 8:57 AM

## 2018-06-15 NOTE — ED Notes (Signed)
Pt is very ill and rude with IV team. They are trying for another line and he is getting very frustrated.

## 2018-06-15 NOTE — ED Notes (Signed)
ED TO INPATIENT HANDOFF REPORT  ED Nurse Name and Phone #:  Erin Fullingngela Ramaj Frangos #[phone]  S Name/Age/Gender Kurt [name] 68 y.o. male Room/Bed: 019C/019C  Code Status   Code Status: Full Code  Home/SNF/Other Home Patient oriented to: self, place, time and situation Is this baseline? Yes   Triage Complete: Triage complete  Chief Complaint rectal bleeding  Triage Note Pt in POV, reports dark tarry stools X few days with gen weakness. Denies abd pain, N/V/D. Pt states he was hypotensive at home with pressures in the 70's. Reports hx of same.    Allergies Allergies  Allergen Reactions  . No Known Allergies     Level of Care/Admitting Diagnosis ED Disposition    ED Disposition Condition Comment   Admit  [facility] [[id number]]  Level of Care: Progressive [102]  I expect the patient will be discharged within 24 hours: No (not a candidate for 5C-Observation unit)  Covid Evaluation: N/A  Diagnosis: Melena [170500]  Admitting Physician: John GiovanniATHORE, VASUNDHRA [[id number]]  Attending Physician: John GiovanniATHORE, VASUNDHRA [[id number]]  PT Class (Do Not Modify): Observation [104]  PT Acc Code (Do Not Modify): Observation [10022]       B Medical/Surgery History Past Medical History:  Diagnosis Date  . Arthritis   . CHF (congestive heart failure) (HCC)    diastolic dysfunction  . Chronic back pain   . Depression   . Diverticulitis   . Hypertension   . PTSD (post-traumatic stress disorder)    after  a Fall   . Renal insufficiency    Decrease in GFR- PCP removed all NSAIDS & aspirin   . TIA (transient ischemic attack) 2014   patient denies [place] was [place]   Past Surgical History:  Procedure Laterality Date  . ELBOW FRACTURE SURGERY     ORIF, Shoulder manipulation    . ESOPHAGOGASTRODUODENOSCOPY N/A 04/30/2014   Procedure: ESOPHAGOGASTRODUODENOSCOPY (EGD);  Surgeon: Jeani HawkingPatrick Hung, MD;  [facility];  Service: Endoscopy;  Laterality: N/A;  . LUMBAR  LAMINECTOMY/DECOMPRESSION MICRODISCECTOMY N/A 02/16/2015   Procedure: Thoracic ten - thoracic twelve laminectomy;  Surgeon: Julio SicksHenry Pool, MD;  Location: [facility] NEURO ORS;  Service: Neurosurgery;  Laterality: N/A;  . LUMBAR LAMINECTOMY/DECOMPRESSION MICRODISCECTOMY N/A 03/22/2016   Procedure: Lumbar One-Two, Lumbar Two-Three, Lumbar Three-Four Laminectomy and Foraminotomy;  Surgeon: Julio SicksHenry Pool, MD;  [facility];  Service: Neurosurgery;  Laterality: N/A;  . NASAL SEPTUM SURGERY       A IV Location/Drains/Wounds Patient Lines/Drains/Airways Status   Active Line/Drains/Airways    Name:   Placement date:   Placement time:   Site:   Days:   Peripheral IV 06/15/18 Right Hand   06/15/18    0120    Hand   less than 1   Peripheral IV 06/15/18 Left;Anterior Forearm   06/15/18    0300    Forearm   less than 1   Incision (Closed) 02/16/15 Back   02/16/15    0941     1215   Incision (Closed) 03/22/16 Back Other (Comment)   03/22/16    1414     815          Intake/Output Last 24 hours  Intake/Output Summary (Last 24 hours) at 06/15/2018 0352 Last data filed at 06/15/2018 [id number] Gross per 24 hour  Intake 3282.12 ml  Output -  Net 3282.12 ml    Labs/Imaging Results for orders placed or performed during the hospital encounter of 06/14/18 (from the past 48 hour(s))  Type and screen  [facility]     Status: None (Preliminary result)   Collection Time: 06/14/18  9:50 PM  Result Value Ref Range   ABO/RH(D) A POS    Antibody Screen NEG    Sample Expiration 06/17/2018,2359    Unit Number [id number]    Blood Component Type RED CELLS,LR    Unit division 00    Status of Unit ISSUED    Transfusion Status OK TO TRANSFUSE    Crossmatch Result      Compatible Performed at [facility], [address] [place], [place] [address]   Comprehensive metabolic panel     Status: Abnormal   Collection Time: 06/14/18  9:53 PM  Result Value Ref Range   Sodium 134 (L) 135 - 145 mmol/L    Potassium 3.7 3.5 - 5.1 mmol/L   Chloride 97 (L) 98 - 111 mmol/L   CO2 25 22 - 32 mmol/L   Glucose, Bld 120 (H) 70 - 99 mg/dL   BUN 86 (H) 8 - 23 mg/dL   Creatinine, Ser 0.10 (H) 0.61 - 1.24 mg/dL   Calcium 8.5 (L) 8.9 - 10.3 mg/dL   Total Protein 6.3 (L) 6.5 - 8.1 g/dL   Albumin 3.5 3.5 - 5.0 g/dL   AST 14 (L) 15 - 41 U/L   ALT 13 0 - 44 U/L   Alkaline Phosphatase 57 38 - 126 U/L   Total Bilirubin 0.6 0.3 - 1.2 mg/dL   GFR calc non Af Amer 33 (L) >60 mL/min   GFR calc Af Amer 38 (L) >60 mL/min   Anion gap 12 5 - 15    Comment: Performed at [facility], [address] [place], [place] [address]  CBC     Status: Abnormal   Collection Time: 06/14/18  9:53 PM  Result Value Ref Range   WBC 15.2 (H) 4.0 - 10.5 K/uL   RBC 3.70 (L) 4.22 - 5.81 MIL/uL   Hemoglobin 10.6 (L) 13.0 - 17.0 g/dL   HCT 57.3 (L) 22.0 - 25.4 %   MCV 91.1 80.0 - 100.0 fL   MCH 28.6 26.0 - 34.0 pg   MCHC 31.5 30.0 - 36.0 g/dL   RDW 27.0 62.3 - 76.2 %   Platelets 308 150 - 400 K/uL   nRBC 0.0 0.0 - 0.2 %    Comment: Performed at [facility], [address] [place], [place] [address]  POC occult blood, ED     Status: Abnormal   Collection Time: 06/15/18 12:07 AM  Result Value Ref Range   Fecal Occult Bld POSITIVE (A) NEGATIVE  Protime-INR     Status: None   Collection Time: 06/15/18 12:10 AM  Result Value Ref Range   Prothrombin Time 13.9 11.4 - 15.2 seconds   INR 1.1 0.8 - 1.2    Comment: (NOTE) INR goal varies based on device and disease states. Performed at [facility], [address] [place], [place] [address]   Prepare RBC     Status: None   Collection Time: 06/15/18  1:10 AM  Result Value Ref Range   Order Confirmation      ORDER PROCESSED BY BLOOD BANK Performed at [facility], [address] [place], [place] [address]   SARS Coronavirus 2 (CEPHEID - Performed in [facility] hospital lab), Hosp Order     Status: None   Collection Time: 06/15/18  1:52 AM  Result  Value Ref Range  SARS Coronavirus 2 NEGATIVE NEGATIVE    Comment: (NOTE) If result is NEGATIVE SARS-CoV-2 target nucleic acids are NOT DETECTED. The SARS-CoV-2 RNA is generally detectable in upper and lower  respiratory specimens during the acute phase of infection. The lowest  concentration of SARS-CoV-2 viral copies this assay can detect is 250  copies / mL. A negative result does not preclude SARS-CoV-2 infection  and should not be used as the sole basis for treatment or other  patient management decisions.  A negative result may occur with  improper specimen collection / handling, submission of specimen other  than nasopharyngeal swab, presence of viral mutation(s) within the  areas targeted by this assay, and inadequate number of viral copies  (<250 copies / mL). A negative result must be combined with clinical  observations, patient history, and epidemiological information. If result is POSITIVE SARS-CoV-2 target nucleic acids are DETECTED. The SARS-CoV-2 RNA is generally detectable in upper and lower  respiratory specimens dur ing the acute phase of infection.  Positive  results are indicative of active infection with SARS-CoV-2.  Clinical  correlation with patient history and other diagnostic information is  necessary to determine patient infection status.  Positive results do  not rule out bacterial infection or co-infection with other viruses. If result is PRESUMPTIVE POSTIVE SARS-CoV-2 nucleic acids MAY BE PRESENT.   A presumptive positive result was obtained on the submitted specimen  and confirmed on repeat testing.  While 2019 novel coronavirus  (SARS-CoV-2) nucleic acids may be present in the submitted sample  additional confirmatory testing may be necessary for epidemiological  and / or clinical management purposes  to differentiate between  SARS-CoV-2 and other Sarbecovirus currently known to infect humans.  If clinically indicated additional testing with an  alternate test  methodology [phone]) is advised. The SARS-CoV-2 RNA is generally  detectable in upper and lower respiratory sp ecimens during the acute  phase of infection. The expected result is Negative. Fact Sheet for Patients:  BoilerBrush.com.cy Fact Sheet for Healthcare Providers: https://pope.com/ This test is not yet approved or cleared by the [place] FDA and has been authorized for detection and/or diagnosis of SARS-CoV-2 by FDA under an Emergency Use Authorization (EUA).  This EUA will remain in effect (meaning this test can be used) for the duration of the COVID-19 declaration under Section 564(b)(1) of the Act, 21 U.S.C. section 360bbb-3(b)(1), unless the authorization is terminated or revoked sooner. Performed at [facility], [address] [place], [place] [address]   Magnesium     Status: None   Collection Time: 06/15/18  1:57 AM  Result Value Ref Range   Magnesium 2.3 1.7 - 2.4 mg/dL    Comment: Performed at [facility], [address], [place], [place] [address]   No results found.  Pending Labs Unresulted Labs (From admission, onward)    Start     Ordered   06/15/18 0602  CBC  Now then every 4 hours,   R     06/15/18 0202   06/15/18 0205  Urinalysis, Routine w reflex microscopic  ONCE - STAT,   R     06/15/18 0205   06/15/18 0151  HIV antibody (Routine Testing)  Once,   R     06/15/18 0152          Vitals/Pain Today's Vitals   06/15/18 0245 06/15/18 0252 06/15/18 0300 06/15/18 0330  BP: 98/69 98/69 (!) 103/92 (!) 102/59  Pulse:  96 96 96  Resp: Marland Kitchen)  Temp:  98.1 F (36.7 C)    TempSrc:  Oral    SpO2:   98% 96%  Weight:      Height:      PainSc:        Isolation Precautions No active isolations  Medications Medications  sodium chloride 0.9 % bolus 1,000 mL (0 mLs Intravenous Stopped 06/15/18 0248)    And  sodium chloride 0.9 % bolus 1,000 mL (0 mLs Intravenous  Stopped 06/15/18 0248)    And  0.9 %  sodium chloride infusion ( Intravenous New Bag/Given 06/15/18 0327)  pantoprazole (PROTONIX) 80 mg in sodium chloride 0.9 % 250 mL (0.32 mg/mL) infusion (8 mg/hr Intravenous New Bag/Given 06/15/18 0341)  potassium chloride 10 mEq in 100 mL IVPB (10 mEq Intravenous New Bag/Given 06/15/18 0327)  pantoprazole (PROTONIX) injection 40 mg (40 mg Intravenous Given 06/15/18 0122)  0.9 %  sodium chloride infusion (Manually program via Guardrails IV Fluids) ( Intravenous New Bag/Given 06/15/18 0304)  pantoprazole (PROTONIX) injection 40 mg (40 mg Intravenous Given 06/15/18 0220)    Mobility walks High fall risk   Focused Assessments Abdominal   R Recommendations: See Admitting Provider Note  Report given to:   Additional Notes: Pt is alert oriented x 4

## 2018-06-15 NOTE — H&P (Addendum)
History and Physical    OLEY LAHAIE UEA:[id number] DOB: [birth year] DOA: 06/14/2018  PCP: Kaleen Mask, MD Patient coming from: Home  Chief Complaint: Melena  HPI: Kurt [name] is a 68 y.o. male with medical history significant of peptic ulcer disease, diverticulosis, chronic back pain, chronic diastolic congestive heart failure, hypertension, renal insufficiency, and conditions listed below presenting to the hospital for evaluation of melena.  Patient reports 4 to 5-day history of generalized weakness, fatigue, and dyspnea on exertion.  States whenever he goes from sitting to standing position his heart starts racing and he feels very weak.  This morning (Jun 14, 2018) he noticed that his stool was dark in the morning.  He then had another bowel movement around 2:30 PM and the stool appeared black in color and he also had some abdominal cramping at that time.  Denies having any abdominal pain at present.  No nausea or vomiting.  States he had a GI bleed several years ago from a bleeding ulcer in his stomach as he was taking a lot of over-the-counter Alleve at that time.  He has now cut down significantly on the use of Aleve for this past 1 year since he started using CBD oil for chronic back pain and arthritis.  He is currently taking Aleve 1 tablet twice daily.  Denies use of any blood thinners.  States he has not had any alcoholic beverages since 1989.  Denies any fevers, chills, or cough.  He does not think he has been exposed to anyone with COVID-19.  ED Course: Blood pressure soft on arrival and orthostatics positive.  Tachycardic.  FOBT positive.  Noted to have melena on rectal exam.  Hemoglobin 10.6, was 13.2 nine months ago.  INR 1.1.  White count 15.2.  BUN 86.  Creatinine 2.0, was 1.7 on labs done 9 months ago.  Received IV Protonix 40 mg and 1 L normal saline bolus.  1 unit PRBCs ordered.  Additional 1 L IV fluid bolus ordered as blood pressure continues to be soft.  Review of  Systems:  All systems reviewed and apart from history of presenting illness, are negative.  Past Medical History:  Diagnosis Date  . Arthritis   . CHF (congestive heart failure) (HCC)    diastolic dysfunction  . Chronic back pain   . Depression   . Diverticulitis   . Hypertension   . PTSD (post-traumatic stress disorder)    after  a Fall   . Renal insufficiency    Decrease in GFR- PCP removed all NSAIDS & aspirin   . TIA (transient ischemic attack) 2014   patient denies states was [place]    Past Surgical History:  Procedure Laterality Date  . ELBOW FRACTURE SURGERY     ORIF, Shoulder manipulation    . ESOPHAGOGASTRODUODENOSCOPY N/A 04/30/2014   Procedure: ESOPHAGOGASTRODUODENOSCOPY (EGD);  Surgeon: Jeani Hawking, MD;  Location: [facility] ENDOSCOPY;  Service: Endoscopy;  Laterality: N/A;  . LUMBAR LAMINECTOMY/DECOMPRESSION MICRODISCECTOMY N/A 02/16/2015   Procedure: Thoracic ten - thoracic twelve laminectomy;  Surgeon: Julio Sicks, MD;  Location: [facility] NEURO ORS;  Service: Neurosurgery;  Laterality: N/A;  . LUMBAR LAMINECTOMY/DECOMPRESSION MICRODISCECTOMY N/A 03/22/2016   Procedure: Lumbar One-Two, Lumbar Two-Three, Lumbar Three-Four Laminectomy and Foraminotomy;  Surgeon: Julio Sicks, MD;  Location: [facility] OR;  Service: Neurosurgery;  Laterality: N/A;  . NASAL SEPTUM SURGERY       reports that he has never smoked. He has never used smokeless tobacco. He reports previous alcohol use. He reports  that he does not use drugs.  Allergies  Allergen Reactions  . No Known Allergies     No family history on file.  Prior to Admission medications   Medication Sig Start Date End Date Taking? Authorizing Provider  acetaminophen (TYLENOL) 650 MG CR tablet Take 1,300 mg by mouth every 8 (eight) hours as needed for pain.    [provider]  cyclobenzaprine (FLEXERIL) 10 MG tablet Take 1 tablet (10 mg total) by mouth 3 (three) times daily as needed for muscle spasms. 03/23/16   Coletta Memos, MD   fluticasone (FLONASE) 50 MCG/ACT nasal spray Place 2 sprays into both nostrils daily as needed for allergies or rhinitis.    [provider]  furosemide (LASIX) 80 MG tablet Take 40 mg by mouth 2 (two) times daily.    [provider]  gabapentin (NEURONTIN) 300 MG capsule Take 300 mg by mouth 3 (three) times daily.    [provider]  HYDROcodone-acetaminophen (NORCO/VICODIN) 5-325 MG tablet Take 1-2 tablets by mouth every 6 (six) hours as needed (breakthrough pain). 09/09/17   Rolan Bucco, MD  lisinopril (PRINIVIL,ZESTRIL) 20 MG tablet Take 20 mg by mouth daily. 12/29/14   [provider]  lovastatin (MEVACOR) 20 MG tablet Take 20 mg by mouth at bedtime.    [provider]  Polyvinyl Alcohol-Povidone (MURINE TEARS FOR DRY EYES OP) Place 2 drops into both eyes as needed (for dry eyes).    [provider]  ranitidine (ZANTAC) 150 MG tablet Take 150 mg by mouth 2 (two) times daily as needed for heartburn.  12/29/14   [provider]  sertraline (ZOLOFT) 100 MG tablet Take 1 tablet (100 mg total) by mouth daily. Patient taking differently: Take 200 mg by mouth daily before breakfast.  07/11/12   Drema Dallas, MD  traMADol (ULTRAM) 50 MG tablet Take 1 tablet (50 mg total) by mouth every 6 (six) hours as needed (moderate pain). Patient not taking: Reported on 03/14/2016 05/01/14   Edsel Petrin, DO  zolpidem (AMBIEN) 5 MG tablet Take 5 mg by mouth at bedtime as needed for sleep.    [provider]    Physical Exam: Vitals:   06/15/18 0252 06/15/18 0300 06/15/18 0330 06/15/18 0415  BP: 98/69 (!) 103/92 (!) 102/59 110/69  Pulse: 96 96 96   Resp: 16 16 17 16   Temp: 98.1 F (36.7 C)     TempSrc: Oral     SpO2:  98% 96%   Weight:      Height:        Physical Exam  Constitutional: He is oriented to person, place, and time. He appears well-developed and well-nourished. No distress.  HENT:  Head: Normocephalic.   Mouth/Throat: Oropharynx is clear and moist.  Eyes: Right eye exhibits no discharge. Left eye exhibits no discharge.  Neck: Neck supple.  Cardiovascular: Normal rate, regular rhythm and intact distal pulses.  Pulmonary/Chest: Effort normal and breath sounds normal. No respiratory distress. He has no wheezes. He has no rales.  Abdominal: Soft. Bowel sounds are normal. He exhibits no distension. There is no abdominal tenderness. There is no rebound and no guarding.  Musculoskeletal:        General: No edema.  Neurological: He is alert and oriented to person, place, and time.  Skin: Skin is warm and dry. He is not diaphoretic.     Labs on Admission: I have personally reviewed following labs and imaging studies  CBC: Recent Labs  Lab 06/14/18  2153  WBC 15.2*  HGB 10.6*  HCT 33.7*  MCV 91.1  PLT 308   Basic Metabolic Panel: Recent Labs  Lab 06/14/18 2153 06/15/18 0157  NA 134*  --   K 3.7  --   CL 97*  --   CO2 25  --   GLUCOSE 120*  --   BUN 86*  --   CREATININE 2.02*  --   CALCIUM 8.5*  --   MG  --  2.3   GFR: Estimated Creatinine Clearance: 47.5 mL/min (A) (by C-G formula based on SCr of 2.02 mg/dL (H)). Liver Function Tests: Recent Labs  Lab 06/14/18 2153  AST 14*  ALT 13  ALKPHOS 57  BILITOT 0.6  PROT 6.3*  ALBUMIN 3.5   No results for input(s): LIPASE, AMYLASE in the last 168 hours. No results for input(s): AMMONIA in the last 168 hours. Coagulation Profile: Recent Labs  Lab 06/15/18 0010  INR 1.1   Cardiac Enzymes: No results for input(s): CKTOTAL, CKMB, CKMBINDEX, TROPONINI in the last 168 hours. BNP (last 3 results) No results for input(s): PROBNP in the last 8760 hours. HbA1C: No results for input(s): HGBA1C in the last 72 hours. CBG: No results for input(s): GLUCAP in the last 168 hours. Lipid Profile: No results for input(s): CHOL, HDL, LDLCALC, TRIG, CHOLHDL, LDLDIRECT in the last 72 hours. Thyroid Function Tests: No results for  input(s): TSH, T4TOTAL, FREET4, T3FREE, THYROIDAB in the last 72 hours. Anemia Panel: No results for input(s): VITAMINB12, FOLATE, FERRITIN, TIBC, IRON, RETICCTPCT in the last 72 hours. Urine analysis:    Component Value Date/Time   COLORURINE YELLOW 06/15/2018 0350   APPEARANCEUR CLEAR 06/15/2018 0350   LABSPEC 1.014 06/15/2018 0350   PHURINE 5.0 06/15/2018 0350   GLUCOSEU NEGATIVE 06/15/2018 0350   HGBUR NEGATIVE 06/15/2018 0350   BILIRUBINUR NEGATIVE 06/15/2018 0350   KETONESUR NEGATIVE 06/15/2018 0350   PROTEINUR NEGATIVE 06/15/2018 0350   UROBILINOGEN 1.0 07/09/2012 0010   NITRITE NEGATIVE 06/15/2018 0350   LEUKOCYTESUR NEGATIVE 06/15/2018 0350    Radiological Exams on Admission: No results found.  EKG: Independently reviewed.  Sinus tachycardia (heart rate 113), QTc 502.  QTc increased since prior tracing.  Assessment/Plan Principal Problem:   Upper GI bleed Active Problems:   AKI (acute kidney injury) (HCC)   Symptomatic anemia   Leukocytosis   QT prolongation   Upper GI bleed, suspect bleeding peptic ulcer Blood pressure soft on arrival and orthostatics positive.  Received 2 L IV fluid boluses. Blood pressure now improved and tachycardia resolved.  Noted to have melena on rectal exam done in the ED.  FOBT positive.  No further bowel movements since he has been in the ED.  Hemoglobin 10.6, was 13.2 on labs done 9 months ago. EGD done April 2016 showing bleeding duodenal ulcer status post bicep and hemoclipping.  Also showing small nonbleeding antral ulcers. -Admit to progressive care unit and monitor hemodynamics closely -Keep n.p.o. -Type and screen -2 large-bore IVs -IV fluid resuscitation.  -1 unit PRBCs ordered in the ED -IV PPI bolus and infusion -Serial CBCs every 4 hours -GI has been consulted, will see the patient in the morning.  Symptomatic anemia secondary to GI bleed 4-5-day history of generalized weakness, fatigue, and dyspnea on exertion.  3 g drop  in hemoglobin. -Type and screen -1 unit PRBCs ordered in the ED -Serial CBCs every 4 hours  Leukocytosis White count 15.2.  Afebrile.  Dyspnea on exertion likely related to symptomatic anemia.  COVID-19  rapid test negative. -Chest x-ray and UA  AKI on CKD 3 Likely prerenal secondary to acute blood loss.  BUN 86.  Creatinine 2.0, was 1.7 on labs done 9 months ago.   -IV fluid -Monitor urine output -Avoid nephrotoxic agents  QTC prolongation on EKG -Keep potassium greater than 4 and magnesium greater than 2 -Will continue PPI for upper GI bleed suspected to be secondary to bleeding peptic ulcer.  Avoid other QTC prolonging drugs. -Repeat EKG in a.m.  DVT prophylaxis: SCDs Code Status: Patient wishes to be full code. Family Communication: No family available. Disposition Plan: Anticipate discharge after clinical improvement. Consults called: GI (Dr. Elnoria HowardHung) Admission status: It is my clinical opinion that referral for OBSERVATION is reasonable and necessary in this patient based on the above information provided. The aforementioned taken together are felt to place the patient at high risk for further clinical deterioration. However it is anticipated that the patient may be medically stable for discharge from the hospital within 24 to 48 hours.  The medical decision making on this patient was of high complexity and the patient is at high risk for clinical deterioration, therefore this is a level 3 visit.  Kurt GiovanniVasundhra Bell Cai MD [facility] Pager [phone]- [phone]  If 7PM-7AM, please contact night-coverage [email or web address] Password [email or web address]  06/15/2018, 4:34 AM

## 2018-06-15 NOTE — H&P (View-Only) (Signed)
Reason for Consult: GI bleed and history of a bleeding duodenal ulcer Referring Physician: [facility]  UNASSIGNED  Kurt [name] HPI: This is a 68 year old male with a PMH of an NSAID-induced duodenal bulb ulcer with active bleeding on 04/30/2014 s/p BICAP and hemoclipping, HTN, arthritis, and morbid obesity presents with symptomatic anemia and melena.  Three to four days prior to Saturday he was feeling an increasing fatigue, which markedly worsened on Saturday.  On Sunday he had two bouts of melena and the second bout was greater than the first bowel movement.  He presented to the ER and his HGB was noted to be at 10.6 g/dL, which dropped down to 9.5 g/dL with IV hydration.  His baseline HGB is in the 13 g/dL range.  On 04/2014 he presented in the same manner and an EGD was performed by Dr. Elnoria Howard as an unassigned patient.  At that time he was taking a significant amount of NSAIDs as he had a fall many years ago and he suffers from arthritis.  He was instructed to avoid NSAIDs after the procedure, but he reports using Aleve BID.  The patient never followed up in the office after his discharge.  Past Medical History:  Diagnosis Date  . Arthritis   . CHF (congestive heart failure) (HCC)    diastolic dysfunction  . Chronic back pain   . Depression   . Diverticulitis   . Hypertension   . PTSD (post-traumatic stress disorder)    after  a Fall   . Renal insufficiency    Decrease in GFR- PCP removed all NSAIDS & aspirin   . TIA (transient ischemic attack) 2014   patient denies states was [place]    Past Surgical History:  Procedure Laterality Date  . ELBOW FRACTURE SURGERY     ORIF, Shoulder manipulation    . ESOPHAGOGASTRODUODENOSCOPY N/A 04/30/2014   Procedure: ESOPHAGOGASTRODUODENOSCOPY (EGD);  Surgeon: Jeani Hawking, MD;  Location: [facility] ENDOSCOPY;  Service: Endoscopy;  Laterality: N/A;  . LUMBAR LAMINECTOMY/DECOMPRESSION MICRODISCECTOMY N/A 02/16/2015   Procedure: Thoracic ten - thoracic  twelve laminectomy;  Surgeon: Julio Sicks, MD;  Location: [facility] NEURO ORS;  Service: Neurosurgery;  Laterality: N/A;  . LUMBAR LAMINECTOMY/DECOMPRESSION MICRODISCECTOMY N/A 03/22/2016   Procedure: Lumbar One-Two, Lumbar Two-Three, Lumbar Three-Four Laminectomy and Foraminotomy;  Surgeon: Julio Sicks, MD;  Location: [facility] OR;  Service: Neurosurgery;  Laterality: N/A;  . NASAL SEPTUM SURGERY      No family history on file.  Social History:  reports that he has never smoked. He has never used smokeless tobacco. He reports previous alcohol use. He reports that he does not use drugs.  Allergies:  Allergies  Allergen Reactions  . No Known Allergies     Medications:  Scheduled:  Continuous: . sodium chloride 125 mL/hr at 06/15/18 0327  . pantoprozole (PROTONIX) infusion 8 mg/hr (06/15/18 0341)    Results for orders placed or performed during the hospital encounter of 06/14/18 (from the past 24 hour(s))  Type and screen [facility]     Status: None (Preliminary result)   Collection Time: 06/14/18  9:50 PM  Result Value Ref Range   ABO/RH(D) A POS    Antibody Screen NEG    Sample Expiration 06/17/2018,2359    Unit Number [id number]    Blood Component Type RED CELLS,LR    Unit division 00    Status of Unit ISSUED    Transfusion Status OK TO TRANSFUSE    Crossmatch Result  Compatible Performed at [facility], [address] [place], [place] [address]   Comprehensive metabolic panel     Status: Abnormal   Collection Time: 06/14/18  9:53 PM  Result Value Ref Range   Sodium 134 (L) 135 - 145 mmol/L   Potassium 3.7 3.5 - 5.1 mmol/L   Chloride 97 (L) 98 - 111 mmol/L   CO2 25 22 - 32 mmol/L   Glucose, Bld 120 (H) 70 - 99 mg/dL   BUN 86 (H) 8 - 23 mg/dL   Creatinine, Ser 6.04 (H) 0.61 - 1.24 mg/dL   Calcium 8.5 (L) 8.9 - 10.3 mg/dL   Total Protein 6.3 (L) 6.5 - 8.1 g/dL   Albumin 3.5 3.5 - 5.0 g/dL   AST 14 (L) 15 - 41 U/L   ALT 13 0 - 44 U/L   Alkaline  Phosphatase 57 38 - 126 U/L   Total Bilirubin 0.6 0.3 - 1.2 mg/dL   GFR calc non Af Amer 33 (L) >60 mL/min   GFR calc Af Amer 38 (L) >60 mL/min   Anion gap 12 5 - 15  CBC     Status: Abnormal   Collection Time: 06/14/18  9:53 PM  Result Value Ref Range   WBC 15.2 (H) 4.0 - 10.5 K/uL   RBC 3.70 (L) 4.22 - 5.81 MIL/uL   Hemoglobin 10.6 (L) 13.0 - 17.0 g/dL   HCT 54.0 (L) 98.1 - 19.1 %   MCV 91.1 80.0 - 100.0 fL   MCH 28.6 26.0 - 34.0 pg   MCHC 31.5 30.0 - 36.0 g/dL   RDW 47.8 29.5 - 62.1 %   Platelets 308 150 - 400 K/uL   nRBC 0.0 0.0 - 0.2 %  POC occult blood, ED     Status: Abnormal   Collection Time: 06/15/18 12:07 AM  Result Value Ref Range   Fecal Occult Bld POSITIVE (A) NEGATIVE  Protime-INR     Status: None   Collection Time: 06/15/18 12:10 AM  Result Value Ref Range   Prothrombin Time 13.9 11.4 - 15.2 seconds   INR 1.1 0.8 - 1.2  Prepare RBC     Status: None   Collection Time: 06/15/18  1:10 AM  Result Value Ref Range   Order Confirmation      ORDER PROCESSED BY BLOOD BANK Performed at [facility], [address], [place], [place] [address]   SARS Coronavirus 2 (CEPHEID - Performed in [facility] hospital lab), Hosp Order     Status: None   Collection Time: 06/15/18  1:52 AM  Result Value Ref Range   SARS Coronavirus 2 NEGATIVE NEGATIVE  Magnesium     Status: None   Collection Time: 06/15/18  1:57 AM  Result Value Ref Range   Magnesium 2.3 1.7 - 2.4 mg/dL  Urinalysis, Routine w reflex microscopic     Status: None   Collection Time: 06/15/18  3:50 AM  Result Value Ref Range   Color, Urine YELLOW YELLOW   APPearance CLEAR CLEAR   Specific Gravity, Urine 1.014 1.005 - 1.030   pH 5.0 5.0 - 8.0   Glucose, UA NEGATIVE NEGATIVE mg/dL   Hgb urine dipstick NEGATIVE NEGATIVE   Bilirubin Urine NEGATIVE NEGATIVE   Ketones, ur NEGATIVE NEGATIVE mg/dL   Protein, ur NEGATIVE NEGATIVE mg/dL   Nitrite NEGATIVE NEGATIVE   Leukocytes,Ua NEGATIVE NEGATIVE  MRSA PCR  Screening     Status: None   Collection Time: 06/15/18  5:30 AM  Result Value Ref Range   MRSA by PCR NEGATIVE NEGATIVE  CBC     Status: Abnormal   Collection Time: 06/15/18  7:09 AM  Result Value Ref Range   WBC 11.8 (H) 4.0 - 10.5 K/uL   RBC 3.29 (L) 4.22 - 5.81 MIL/uL   Hemoglobin 9.5 (L) 13.0 - 17.0 g/dL   HCT 16.129.3 (L) 09.639.0 - 04.552.0 %   MCV 89.1 80.0 - 100.0 fL   MCH 28.9 26.0 - 34.0 pg   MCHC 32.4 30.0 - 36.0 g/dL   RDW 40.914.3 81.111.5 - 91.415.5 %   Platelets 223 150 - 400 K/uL   nRBC 0.0 0.0 - 0.2 %     Dg Chest 2 View  Result Date: 06/15/2018 CLINICAL DATA:  Initial evaluation for acute leukocytosis. EXAM: CHEST - 2 VIEW COMPARISON:  Prior radiograph from 04/29/2014 FINDINGS: Cardiac and mediastinal silhouettes are stable in size and contour, and remain within normal limits. Lungs mildly hypoinflated. Mild streaky bibasilar subsegmental atelectasis. No consolidative opacity to suggest pneumonia. No pulmonary edema or definite pleural effusion, although the costophrenic angles are incompletely visualized. No pneumothorax. No acute osseous finding. Degenerative changes noted about the shoulders bilaterally. IMPRESSION: 1. Shallow lung inflation with mild streaky bibasilar subsegmental atelectasis. 2. No other active cardiopulmonary disease. Electronically Signed   By: Rise MuBenjamin  McClintock M.D.   On: 06/15/2018 04:57    ROS:  As stated above in the HPI otherwise negative.  Blood pressure 105/64, pulse 87, temperature 97.7 F (36.5 C), temperature source Oral, resp. rate 18, height 5\' 10"  (1.778 m), weight 133.8 kg, SpO2 100 %.    PE: Gen: NAD, Alert and Oriented HEENT:  [place]/AT, EOMI Neck: Supple, no LAD Lungs: CTA Bilaterally CV: RRR without M/G/R ABM: Soft, NTND, +BS Ext: No C/C/E  Assessment/Plan: 1) GI bleed. 2) History of a bleeding NSAID-induced duodenal bulb ulcer. 3) Anemia.   It is likely that he has an NSAID-induced ulcer again.  He is feeling better with the one unit of  PRBC, but he still feels fatigued.  His BP has improved.  Plan: 1) EGD tomorrow with Dr. Marina GoodellPerry. 2) Follow HGB and transfuse as necessary.  Nasya Vincent D 06/15/2018, 8:57 AM

## 2018-06-15 NOTE — Evaluation (Signed)
Physical Therapy Evaluation Patient Details Name: Kurt [name] MRN: [id number] DOB: 30-Dec-1950 Today's Date: 06/15/2018   History of Present Illness  Patient is a 68 y/o male who presents with generalized weakness, melena, fatigue, DOE. Found to have acute GI bleed. PMH includes PTSD, HTN, depression, CHF, TIA.   Clinical Impression  Patient presents with generalized weakness, fatigue, deconditioning, impaired balance and impaired mobility s/p above. Tolerated bed mobility, standing and taking a few steps to get to chair with Min A-min guard for safety. Will likely benefit from use of RW for initiation of gait training. Declined further ambulation today due to fatigue. Pt lives alone and independent PTA. 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 Encouraged OOB to chair as much as tolerated. Will follow acutely to maximize independence and mobility prior to return home.     Follow Up Recommendations Home health PT;Supervision for mobility/OOB;Supervision/Assistance - 24 hour    Equipment Recommendations  None recommended by PT    Recommendations for Other Services       Precautions / Restrictions Precautions Precautions: Fall Precaution Comments: per notes, orthostatic Restrictions Weight Bearing Restrictions: No      Mobility  Bed Mobility Overal bed mobility: Needs Assistance Bed Mobility: Rolling;Sidelying to Sit Rolling: Min guard Sidelying to sit: Min guard;HOB elevated       General bed mobility comments: Use of rail to get to EOB wtih increased effort. no dizziness.  Transfers Overall transfer level: Needs assistance Equipment used: None Transfers: Sit to/from Stand Sit to Stand: Min guard;Min assist         General transfer comment: Min A to steady in standing, difficulty getting fully upright.   Ambulation/Gait Ambulation/Gait assistance: Min assist Gait Distance (Feet): 3 Feet Assistive device: None Gait Pattern/deviations:  Wide base of support;Shuffle;Trunk flexed Gait velocity: decreased   General Gait Details: Able to take a few steps to get to the chair with UE support reaching for counter. Bil knee instability noted. Declined further ambulation feeling weak per report.  Stairs            Wheelchair Mobility    Modified Rankin (Stroke Patients Only)       Balance Overall balance assessment: Needs assistance Sitting-balance support: Feet supported;No upper extremity supported Sitting balance-Leahy Scale: Fair     Standing balance support: During functional activity Standing balance-Leahy Scale: Poor Standing balance comment: Requires UE support in standing vs external support.                              Pertinent Vitals/Pain Pain Assessment: Faces Faces Pain Scale: Hurts little more Pain Location: headache Pain Descriptors / Indicators: Headache Pain Intervention(s): Monitored during session;Repositioned    Home Living Family/patient expects to be discharged to:: Private residence Living Arrangements: Alone Available Help at Discharge: Family;Available PRN/intermittently Type of Home: House Home Access: Stairs to enter Entrance Stairs-Rails: Doctor, general practice of Steps: 7 Home Layout: One level Home Equipment: Walker - 2 wheels;Cane - single point;Shower seat      Prior Function Level of Independence: Independent         Comments: Education officer, 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 Travels a lot.      Hand Dominance   Dominant Hand: Right    Extremity/Trunk Assessment   Upper Extremity Assessment Upper Extremity Assessment: Defer to OT evaluation    Lower Extremity Assessment Lower Extremity Assessment: Generalized weakness  Communication   Communication: No difficulties  Cognition Arousal/Alertness: Awake/alert Behavior During Therapy: WFL for tasks assessed/performed Overall Cognitive Status: Within Functional  Limits for tasks assessed                                        General Comments      Exercises     Assessment/Plan    PT Assessment Patient needs continued PT services  PT Problem List Decreased strength;Decreased balance;Pain;Decreased activity tolerance;Decreased mobility       PT Treatment Interventions Functional mobility training;Balance training;Patient/family education;Gait training;Therapeutic activities;Therapeutic exercise    PT Goals (Current goals can be found in the Care Plan section)  Acute Rehab PT Goals Patient Stated Goal: to go home PT Goal Formulation: With patient Time For Goal Achievement: 06/29/18 Potential to Achieve Goals: Good    Frequency Min 3X/week   Barriers to discharge Decreased caregiver support lives alone    Co-evaluation               AM-PAC PT "6 Clicks" Mobility  Outcome Measure Help needed turning from your back to your side while in a flat bed without using bedrails?: A Little Help needed moving from lying on your back to sitting on the side of a flat bed without using bedrails?: A Little Help needed moving to and from a bed to a chair (including a wheelchair)?: A Little Help needed standing up from a chair using your arms (e.g., wheelchair or bedside chair)?: A Little Help needed to walk in hospital room?: A Little Help needed climbing 3-5 steps with a railing? : A Lot 6 Click Score: 17    End of Session Equipment Utilized During Treatment: Gait belt Activity Tolerance: Patient limited by fatigue Patient left: in chair;with call bell/phone within reach Nurse Communication: Mobility status PT Visit Diagnosis: Muscle weakness (generalized) (M62.81);Unsteadiness on feet (R26.81);Difficulty in walking, not elsewhere classified (R26.2);Pain Pain - part of body: (headache)    Time: [id number] PT Time Calculation (min) (ACUTE ONLY): 23 min   Charges:   PT Evaluation $PT Eval Low Complexity: 1 Low PT  Treatments $Therapeutic Activity: 8-22 mins        Mylo RedShauna Kedra Mcglade, PT, DPT Acute Rehabilitation Services Pager [phone] Office [phone]      Blake DivineShauna A Lanier EnsignHartshorne 06/15/2018, 3:29 PM

## 2018-06-15 NOTE — ED Notes (Signed)
Patient transported to X-ray 

## 2018-06-16 ENCOUNTER — Observation Stay (HOSPITAL_COMMUNITY): Payer: Medicare Other | Admitting: Registered Nurse

## 2018-06-16 ENCOUNTER — Observation Stay (HOSPITAL_BASED_OUTPATIENT_CLINIC_OR_DEPARTMENT_OTHER): Payer: Medicare Other

## 2018-06-16 ENCOUNTER — Encounter (HOSPITAL_COMMUNITY)
Admission: EM | Disposition: A | Discharge status: Home / Self Care | Payer: Self-pay | Source: Home / Self Care | Attending: Internal Medicine

## 2018-06-16 ENCOUNTER — Encounter (HOSPITAL_COMMUNITY): Payer: Self-pay | Admitting: Registered Nurse

## 2018-06-16 DIAGNOSIS — Z8711 Personal history of peptic ulcer disease: Secondary | ICD-10-CM | POA: Diagnosis not present

## 2018-06-16 DIAGNOSIS — Z791 Long term (current) use of non-steroidal anti-inflammatories (NSAID): Secondary | ICD-10-CM | POA: Diagnosis not present

## 2018-06-16 DIAGNOSIS — R0600 Dyspnea, unspecified: Secondary | ICD-10-CM | POA: Diagnosis not present

## 2018-06-16 DIAGNOSIS — I130 Hypertensive heart and chronic kidney disease with heart failure and stage 1 through stage 4 chronic kidney disease, or unspecified chronic kidney disease: Secondary | ICD-10-CM | POA: Diagnosis present

## 2018-06-16 DIAGNOSIS — I5032 Chronic diastolic (congestive) heart failure: Secondary | ICD-10-CM | POA: Diagnosis present

## 2018-06-16 DIAGNOSIS — Z9181 History of falling: Secondary | ICD-10-CM | POA: Diagnosis not present

## 2018-06-16 DIAGNOSIS — Z1159 Encounter for screening for other viral diseases: Secondary | ICD-10-CM | POA: Diagnosis not present

## 2018-06-16 DIAGNOSIS — K260 Acute duodenal ulcer with hemorrhage: Secondary | ICD-10-CM | POA: Diagnosis present

## 2018-06-16 DIAGNOSIS — X58XXXA Exposure to other specified factors, initial encounter: Secondary | ICD-10-CM | POA: Diagnosis present

## 2018-06-16 DIAGNOSIS — G4733 Obstructive sleep apnea (adult) (pediatric): Secondary | ICD-10-CM | POA: Diagnosis present

## 2018-06-16 DIAGNOSIS — I951 Orthostatic hypotension: Secondary | ICD-10-CM | POA: Diagnosis present

## 2018-06-16 DIAGNOSIS — K264 Chronic or unspecified duodenal ulcer with hemorrhage: Secondary | ICD-10-CM | POA: Diagnosis not present

## 2018-06-16 DIAGNOSIS — M549 Dorsalgia, unspecified: Secondary | ICD-10-CM | POA: Diagnosis present

## 2018-06-16 DIAGNOSIS — N183 Chronic kidney disease, stage 3 (moderate): Secondary | ICD-10-CM | POA: Diagnosis present

## 2018-06-16 DIAGNOSIS — F329 Major depressive disorder, single episode, unspecified: Secondary | ICD-10-CM | POA: Diagnosis present

## 2018-06-16 DIAGNOSIS — D72829 Elevated white blood cell count, unspecified: Secondary | ICD-10-CM | POA: Diagnosis present

## 2018-06-16 DIAGNOSIS — Z8673 Personal history of transient ischemic attack (TIA), and cerebral infarction without residual deficits: Secondary | ICD-10-CM | POA: Diagnosis not present

## 2018-06-16 DIAGNOSIS — M1990 Unspecified osteoarthritis, unspecified site: Secondary | ICD-10-CM | POA: Diagnosis present

## 2018-06-16 DIAGNOSIS — G8929 Other chronic pain: Secondary | ICD-10-CM | POA: Diagnosis present

## 2018-06-16 DIAGNOSIS — N179 Acute kidney failure, unspecified: Secondary | ICD-10-CM | POA: Diagnosis present

## 2018-06-16 DIAGNOSIS — Z79899 Other long term (current) drug therapy: Secondary | ICD-10-CM | POA: Diagnosis not present

## 2018-06-16 DIAGNOSIS — K921 Melena: Secondary | ICD-10-CM | POA: Diagnosis not present

## 2018-06-16 DIAGNOSIS — D62 Acute posthemorrhagic anemia: Secondary | ICD-10-CM | POA: Diagnosis present

## 2018-06-16 DIAGNOSIS — R9431 Abnormal electrocardiogram [ECG] [EKG]: Secondary | ICD-10-CM | POA: Diagnosis present

## 2018-06-16 DIAGNOSIS — K922 Gastrointestinal hemorrhage, unspecified: Secondary | ICD-10-CM | POA: Diagnosis not present

## 2018-06-16 DIAGNOSIS — Z6841 Body Mass Index (BMI) 40.0 and over, adult: Secondary | ICD-10-CM | POA: Diagnosis not present

## 2018-06-16 DIAGNOSIS — Z79891 Long term (current) use of opiate analgesic: Secondary | ICD-10-CM | POA: Diagnosis not present

## 2018-06-16 DIAGNOSIS — K210 Gastro-esophageal reflux disease with esophagitis: Secondary | ICD-10-CM | POA: Diagnosis present

## 2018-06-16 DIAGNOSIS — T39315A Adverse effect of propionic acid derivatives, initial encounter: Secondary | ICD-10-CM | POA: Diagnosis present

## 2018-06-16 HISTORY — PX: HEMOSTASIS CLIP PLACEMENT: SHX6857

## 2018-06-16 HISTORY — PX: SCLEROTHERAPY: SHX6841

## 2018-06-16 HISTORY — PX: ESOPHAGOGASTRODUODENOSCOPY (EGD) WITH PROPOFOL: SHX5813

## 2018-06-16 HISTORY — PX: BIOPSY: SHX5522

## 2018-06-16 LAB — BPAM RBC
Blood Product Expiration Date: 202006092359
ISSUE DATE / TIME: 202005250155
Unit Type and Rh: 6200

## 2018-06-16 LAB — HEMOGLOBIN A1C
Hgb A1c MFr Bld: 5.9 % — ABNORMAL HIGH (ref 4.8–5.6)
Mean Plasma Glucose: 123 mg/dL

## 2018-06-16 LAB — ECHOCARDIOGRAM COMPLETE
Height: 70 in
Weight: 4720 oz

## 2018-06-16 LAB — TYPE AND SCREEN
ABO/RH(D): A POS
Antibody Screen: NEGATIVE
Unit division: 0

## 2018-06-16 SURGERY — ESOPHAGOGASTRODUODENOSCOPY (EGD) WITH PROPOFOL
Anesthesia: Monitor Anesthesia Care

## 2018-06-16 MED ORDER — EPINEPHRINE 1 MG/10ML IJ SOSY
PREFILLED_SYRINGE | INTRAMUSCULAR | Status: AC
  Filled 2018-06-16: qty 10

## 2018-06-16 MED ORDER — PROPOFOL 500 MG/50ML IV EMUL
INTRAVENOUS | Status: AC
  Filled 2018-06-16: qty 150

## 2018-06-16 MED ORDER — PHENYLEPHRINE HCL-NACL 10-0.9 MG/250ML-% IV SOLN
INTRAVENOUS | Status: AC
  Filled 2018-06-16: qty 1000

## 2018-06-16 MED ORDER — PROPOFOL 10 MG/ML IV BOLUS
INTRAVENOUS | Status: DC | PRN
  Administered 2018-06-16: 20 mg via INTRAVENOUS

## 2018-06-16 MED ORDER — SODIUM CHLORIDE 0.9 % IV SOLN: INTRAVENOUS | Status: DC

## 2018-06-16 MED ORDER — PROPOFOL 500 MG/50ML IV EMUL
INTRAVENOUS | Status: DC | PRN
  Administered 2018-06-16: 100 ug/kg/min via INTRAVENOUS

## 2018-06-16 MED ORDER — PERFLUTREN LIPID MICROSPHERE
1.0000 mL | INTRAVENOUS | Status: AC | PRN
  Administered 2018-06-16: 6 mL via INTRAVENOUS
  Filled 2018-06-16: qty 10

## 2018-06-16 MED ORDER — LIDOCAINE 2% (20 MG/ML) 5 ML SYRINGE
INTRAMUSCULAR | Status: DC | PRN
  Administered 2018-06-16: 40 mg via INTRAVENOUS
  Administered 2018-06-16: 60 mg via INTRAVENOUS

## 2018-06-16 MED ORDER — SODIUM CHLORIDE (PF) 0.9 % IJ SOLN
PREFILLED_SYRINGE | INTRAMUSCULAR | Status: DC | PRN
  Administered 2018-06-16: 2 mL

## 2018-06-16 SURGICAL SUPPLY — 15 items

## 2018-06-16 NOTE — Interval H&P Note (Signed)
History and Physical Interval Note:  06/16/2018 9:00 AM  Luna Glasgow  has presented today for surgery, with the diagnosis of GI bleed.  The various methods of treatment have been discussed with the patient and family. After consideration of risks, benefits and other options for treatment, the patient has consented to  Procedure(s): ESOPHAGOGASTRODUODENOSCOPY (EGD) WITH PROPOFOL (N/A) as a surgical intervention.  The patient's history has been reviewed, patient examined, no change in status, stable for surgery.  I have reviewed the patient's chart and labs.  Questions were answered to the patient's satisfaction.     Kurt [name]

## 2018-06-16 NOTE — Progress Notes (Signed)
PT Cancellation Note  Patient Details Name: Kurt [name] MRN: [id number] DOB: Mar 13, 1950   Cancelled Treatment:    Reason Eval/Treat Not Completed: (P) Patient at procedure or test/unavailable(Pt off unit for EGD will f/u per POC.  )   [name] Artis Delay 06/16/2018, 9:42 AM  Joycelyn Rua, PTA Acute Rehabilitation Services Pager [phone] Office [phone]

## 2018-06-16 NOTE — Progress Notes (Signed)
PT Cancellation Note  Patient Details Name: Kurt [name] MRN: [id number] DOB: August 24, 1950   Cancelled Treatment:    Reason Eval/Treat Not Completed: (P) Fatigue/lethargy limiting ability to participate(Pt reports he still feels drowsy from procedure and wished to rest.  Reports he may try later if up for it.  Will f/u per POC as time permits.  )   Dioselina Brumbaugh Artis Delay 06/16/2018, 11:56 AM  Joycelyn Rua, PTA Acute Rehabilitation Services Pager [phone] Office [phone]

## 2018-06-16 NOTE — Progress Notes (Signed)
  Echocardiogram 2D Echocardiogram has been performed.  Kurt [name] 06/16/2018, 9:03 AM

## 2018-06-16 NOTE — Interval H&P Note (Signed)
History and Physical Interval Note:  06/16/2018 9:05 AM  Kurt [name]  has presented today for surgery, with the diagnosis of GI bleed.  The various methods of treatment have been discussed with the patient and family. After consideration of risks, benefits and other options for treatment, the patient has consented to  Procedure(s): ESOPHAGOGASTRODUODENOSCOPY (EGD) WITH PROPOFOL (N/A) as a surgical intervention.  The patient's history has been reviewed, patient examined, no change in status, stable for surgery.  I have reviewed the patient's chart and labs.  Questions were answered to the patient's satisfaction.     [name]

## 2018-06-16 NOTE — Anesthesia Postprocedure Evaluation (Signed)
Anesthesia Post Note  Patient: Kurt [name]  Procedure(s) Performed: ESOPHAGOGASTRODUODENOSCOPY (EGD) WITH PROPOFOL (N/A ) SCLEROTHERAPY HEMOSTASIS CLIP PLACEMENT BIOPSY     Patient location during evaluation: Endoscopy Anesthesia Type: MAC Level of consciousness: awake and alert, patient cooperative and oriented Pain management: pain level controlled Vital Signs Assessment: post-procedure vital signs reviewed and stable Respiratory status: spontaneous breathing, nonlabored ventilation and respiratory function stable Cardiovascular status: blood pressure returned to baseline and stable Postop Assessment: no apparent nausea or vomiting Anesthetic complications: no    Last Vitals:  Vitals:   06/16/18 1040 06/16/18 1051  BP: (!) 129/40 (!) 123/48  Pulse: 90 88  Resp: 16 16  Temp: 36.4 C   SpO2: 99% 97%    Last Pain:  Vitals:   06/16/18 1051  TempSrc:   PainSc: 0-No pain                 Kathyann Spaugh,E. Cyleigh Massaro

## 2018-06-16 NOTE — Transfer of Care (Signed)
Immediate Anesthesia Transfer of Care Note  Patient: Kurt [name]  Procedure(s) Performed: ESOPHAGOGASTRODUODENOSCOPY (EGD) WITH PROPOFOL (N/A ) SCLEROTHERAPY HEMOSTASIS CLIP PLACEMENT BIOPSY  Patient Location: Endoscopy Unit  Anesthesia Type:MAC  Level of Consciousness: awake, alert  and oriented  Airway & Oxygen Therapy: Patient Spontanous Breathing and Patient connected to nasal cannula oxygen  Post-op Assessment: Report given to RN and Post -op Vital signs reviewed and stable  Post vital signs: Reviewed and stable  Last Vitals:  Vitals Value Taken Time  BP 129/40 06/16/2018 10:41 AM  Temp    Pulse 90 06/16/2018 10:41 AM  Resp 14 06/16/2018 10:41 AM  SpO2 100 % 06/16/2018 10:41 AM  Vitals shown include unvalidated device data.  Last Pain:  Vitals:   06/16/18 0922  TempSrc: Other (Comment)  PainSc: 0-No pain         Complications: No apparent anesthesia complications

## 2018-06-16 NOTE — Anesthesia Preprocedure Evaluation (Addendum)
Anesthesia Evaluation  Patient identified by MRN, date of birth, ID band Patient awake    Reviewed: Allergy & Precautions, NPO status , Patient's Chart, lab work & pertinent test results, reviewed documented beta blocker date and time   History of Anesthesia Complications Negative for: history of anesthetic complications  Airway Mallampati: II  TM Distance: >3 FB Neck ROM: Full    Dental  (+) Dental Advisory Given   Pulmonary neg sleep apnea,  06/15/2018 SARS Coronavirus NEG   breath sounds clear to auscultation       Cardiovascular hypertension, Pt. on medications and Pt. on home beta blockers (-) angina Rhythm:Regular Rate:Normal     Neuro/Psych PSYCHIATRIC DISORDERS (PTSD) Anxiety Depression vertigo TIA   GI/Hepatic Neg liver ROS, GERD  Medicated and Controlled,  Endo/Other  Morbid obesity  Renal/GU Renal InsufficiencyRenal disease (creat 2.02)     Musculoskeletal   Abdominal (+) + obese,   Peds  Hematology  (+) Blood dyscrasia (Hb 9.4), anemia ,   Anesthesia Other Findings   Reproductive/Obstetrics                            Anesthesia Physical Anesthesia Plan  ASA: III  Anesthesia Plan: MAC   Post-op Pain Management:    Induction:   PONV Risk Score and Plan: 1  Airway Management Planned: Natural Airway and Nasal Cannula  Additional Equipment:   Intra-op Plan:   Post-operative Plan:   Informed Consent: I have reviewed the patients History and Physical, chart, labs and discussed the procedure including the risks, benefits and alternatives for the proposed anesthesia with the patient or authorized representative who has indicated his/her understanding and acceptance.     Dental advisory given  Plan Discussed with: CRNA and Surgeon  Anesthesia Plan Comments:        Anesthesia Quick Evaluation

## 2018-06-16 NOTE — Progress Notes (Signed)
PROGRESS NOTE  Kurt [name] ZOX:[id number] DOB: 07-Oct-1950 DOA: 06/14/2018 PCP: Kaleen MaskElkins, Wilson Oliver, MD   LOS: 0 days   Brief narrative: Patientis a 68 y.o.malewith PMH of hypertension, diastolic CHF, TIA, CKD, gastric ulcer in the past, diverticulosis, PTSD after a fall from 30 feet in 2002 sustaining multiple fractures with subsequent chronic pain and physical limitations for which he takes Aleve on a regular basis Patient presented to the ED on 5/24 with 4 to 5-day history of melena, generalized weakness, dyspnea on exertion, and orthostatic symptoms. Noted to have orthostatic drop in blood pressure. FOBT positive. Hemoglobin 10.6, a drop from 13.2 nine months ago. Creatinine elevated to 2, was 1.7 nine months ago. COVID-19 test negative. Patient was admitted for acute GI bleeding.  1 unit of PRBC was transfused.  Patient was started on Protonix drip. GI consult obtained.  Patient underwent EGD today.  Subjective: Patient was seen and examined this afternoon.  He underwent EGD today.  Report below. No other complaint at this time.  Assessment/Plan:  Principal Problem:   Upper GI bleed Active Problems:   AKI (acute kidney injury) (HCC)   Symptomatic anemia   Leukocytosis   QT prolongation   Acute duodenal ulcer with bleeding   Melena  Impression:               1. Bleeding duodenal ulcer status post successful                            endoscopic hemostatic therapy                           2. GERD with endoscopic evidence of esophagitis and                            partial stricture. Recommendation:           1. Continue pantoprazole IV infusion therapy                           2. Avoid NSAIDs                           3. Follow-up Helicobacter pylori testing                           4. Clear liquid diet only                           5. Observe for any evidence of recurrent bleeding                           GI will continue to follow.  Acute upper GI  bleeding -Underwent EGD today.  Found to have bleeding duodenal ulcer s/p successful endoscopic hemostatic therapy.  Also has GERD with endoscopic evidence of esophagitis and partial stricture. -GI recommended to continue Protonix drip for now.  H. pylori testing was sent.  Clear liquid diet started.    Acute blood loss anemia -Secondary to bleeding from duodenal ulcer. - Hemoglobin 13.2, nine months ago.  10.6 on presentation. 1 unit of PRBC was given.  Repeat CBC tomorrow.  Leukocytosis White count 15.2. Afebrile. Dyspnea on exertion likely related  to symptomatic anemia.COVID-19 rapid test negative. -Chest x-rayand UA normal as well.  Continue to monitor. -Repeat WBC count tomorrow.  AKI on CKD 3 -Baseline creatinine 1.7 , nine months ago.   -Presented with elevated creatinine of 2.  Likely from a combination of NSAIDs as well as decreased renal perfusion, orthostatic hypotension.   -Continue to follow renal function with IV hydration and blood transfusion. -Strongly encouraged to avoid NSAIDs.  History of hypertension/diastolic CHF -Prior to admission, patient was on amlodipine 2.5 mg daily, Lasix 40 mg twice daily, lisinopril 20 mg daily. - Noted to have orthostatic drop in blood pressure on admission.  Blood pressure medicines were held.  BP trending up now.  Continue to monitor.  Resume medicine as required. -Normal echocardiogram, EF 55 to 60%.  QTC prolongation on EKG -QTC 502 ms on admission.   -Target potassium greater than 4 and magnesium greater than 2 -Repeat BMP and EKG tomorrow.  Morbid obesity - Body mass index is 42.33 kg/m. Patient has been advised to make an attempt to improve diet and exercise patterns to aid in weight loss.  Although he has significant limitations because of history of fall, fracture and chronic pain. He also have underlying obstructive sleep apnea.  He lives alone.  Mobility: Pending PT evaluation Diet: N.p.o.  Defer to GI for diet  advancement DVT prophylaxis: SCDs Code Status:  Code Status: Full Code  Family Communication: Expected Discharge: Home in 2 to 3 days  Consultants:  GI  Procedures:  EGD today.  Antimicrobials:  Anti-infectives (From admission, onward)   None      Infusions:  . sodium chloride 125 mL/hr at 06/16/18 1217  . pantoprozole (PROTONIX) infusion 8 mg/hr (06/16/18 1053)    Scheduled Meds:   PRN meds: acetaminophen   Objective: Vitals:   06/16/18 1100 06/16/18 1321  BP: (!) 131/53 (!) 139/50  Pulse: 85 92  Resp: 15 18  Temp:  98.4 F (36.9 C)  SpO2: 100%     Intake/Output Summary (Last 24 hours) at 06/16/2018 1509 Last data filed at 06/16/2018 1042 Gross per 24 hour  Intake 2441.52 ml  Output 1380 ml  Net 1061.52 ml   Filed Weights   06/14/18 2148 06/16/18 0922  Weight: 133.8 kg 133.8 kg   Weight change:  Body mass index is 42.32 kg/m.   Physical Exam: General exam: Appears calm and comfortable.  Skin: No rashes, lesions or ulcers. HEENT: Atraumatic, normocephalic, supple neck, no obvious bleeding Lungs: Clear to auscultation bilaterally CVS: Regular rate and rhythm, no murmur GI/Abd soft, nontender, distended from obesity, bowel sound present CNS: Alert, awake, oriented x3 Psychiatry: Mood appropriate Extremities: No pedal edema, no calf tenderness  Data Review: I have personally reviewed the laboratory data and studies available.  Recent Labs  Lab 06/14/18 2153 06/15/18 0709 06/15/18 0924  WBC 15.2* 11.8* 12.5*  HGB 10.6* 9.5* 9.4*  HCT 33.7* 29.3* 28.4*  MCV 91.1 89.1 86.9  PLT 308 223 217   Recent Labs  Lab 06/14/18 2153 06/15/18 0157  NA 134*  --   K 3.7  --   CL 97*  --   CO2 25  --   GLUCOSE 120*  --   BUN 86*  --   CREATININE 2.02*  --   CALCIUM 8.5*  --   MG  --  2.3    Lorin Glass, MD  [facility] 06/16/2018

## 2018-06-16 NOTE — Op Note (Signed)
[facility] Patient Name: Kurt [name] ReasonFloyd Bottoms Procedure Date : 06/16/2018 MRN: [id number] Attending MD: Wilhemina BonitoJohn N. Marina GoodellPerry , MD Date of Birth: [birth year] CSN: [id number] Age: [age] Admit Type: Inpatient Procedure:                Upper GI endoscopy, with biopsy; with endoscopic                            hemostatic therapy Indications:              Melena Providers:                Wilhemina BonitoJohn N. Marina GoodellPerry, MD, Margaree MackintoshHayleigh Westmoreland, RN,                            Matthew FolksBrooke Cummings, Technician, Arlee Muslimhris [name] Tech.,                            Technician, Teresita MaduraPaul Welty, CRNA Referring MD:             [facility] Medicines:                Monitored Anesthesia Care Complications:            No immediate complications. Estimated Blood Loss:     Estimated blood loss: none. Procedure:                Pre-Anesthesia Assessment:                           - Prior to the procedure, a History and Physical                            was performed, and patient medications and                            allergies were reviewed. The patient's tolerance of                            previous anesthesia was also reviewed. The risks                            and benefits of the procedure and the sedation                            options and risks were discussed with the patient.                            All questions were answered, and informed consent                            was obtained. Prior Anticoagulants: The patient has                            taken no previous anticoagulant or antiplatelet                            agents.  ASA Grade Assessment: II - A patient with                            mild systemic disease. After reviewing the risks                            and benefits, the patient was deemed in                            satisfactory condition to undergo the procedure.                           After obtaining informed consent, the endoscope was                            passed under  direct vision. Throughout the                            procedure, the patient's blood pressure, pulse, and                            oxygen saturations were monitored continuously. The                            GIF-H190 ([id number]) Olympus gastroscope was                            introduced through the mouth, and advanced to the                            second part of duodenum. The upper GI endoscopy was                            accomplished without difficulty. The patient                            tolerated the procedure well. Scope In: Scope Out: Findings:      The esophagus revealed mild esophagitis and a partial large caliber       stricture.      The stomach was normal except for a small sliding hiatal hernia.       Biopsies were taken with a cold forceps from the gastric antrum for       Helicobacter pylori testing (histopathology).      The cardia and gastric fundus were normal on retroflexion.      One oozing cratered duodenal ulcer with a visible vessel was found just       beyond the bulb in the second portion of the duodenum. The lesion was 8       mm in largest dimension. Area was successfully injected with 2-3 mL of a       1:10,000 solution of epinephrine for hemostasis. For hemostasis, two       hemostatic clips were successfully placed. There was no bleeding at the       end of the procedure. Impression:  1. Bleeding duodenal ulcer status post successful                            endoscopic hemostatic therapy                           2. GERD with endoscopic evidence of esophagitis and                            partial stricture. Recommendation:           1. Continue pantoprazole IV infusion therapy                           2. Avoid NSAIDs                           3. Follow-up Helicobacter pylori testing                           4. Clear liquid diet only                           5. Observe for any evidence of recurrent bleeding                            GI will continue to follow. Procedure Code(s):        --- Professional ---                           [phone], 59, Esophagogastroduodenoscopy, flexible,                            transoral; with control of bleeding, any method                           43239, Esophagogastroduodenoscopy, flexible,                            transoral; with biopsy, single or multiple Diagnosis Code(s):        --- Professional ---                           K26.4, Chronic or unspecified duodenal ulcer with                            hemorrhage                           K92.1, Melena (includes Hematochezia) CPT copyright 2019 [employer]. All rights reserved. The codes documented in this report are preliminary and upon coder review may  be revised to meet current compliance requirements. Wilhemina Bonito. Marina Goodell, MD 06/16/2018 10:48:04 AM This report has been signed electronically. Number of Addenda: 0

## 2018-06-17 ENCOUNTER — Encounter (HOSPITAL_COMMUNITY): Payer: Self-pay | Admitting: Internal Medicine

## 2018-06-17 DIAGNOSIS — D62 Acute posthemorrhagic anemia: Secondary | ICD-10-CM | POA: Diagnosis present

## 2018-06-17 LAB — BASIC METABOLIC PANEL
Anion gap: 6 (ref 5–15)
BUN: 22 mg/dL (ref 8–23)
CO2: 22 mmol/L (ref 22–32)
Calcium: 8.2 mg/dL — ABNORMAL LOW (ref 8.9–10.3)
Chloride: 111 mmol/L (ref 98–111)
Creatinine, Ser: 0.97 mg/dL (ref 0.61–1.24)
GFR calc Af Amer: >60 mL/min (ref >60)
GFR calc non Af Amer: >60 mL/min (ref >60)
Glucose, Bld: 100 mg/dL — ABNORMAL HIGH (ref 70–99)
Potassium: 4.1 mmol/L (ref 3.5–5.1)
Sodium: 139 mmol/L (ref 135–145)

## 2018-06-17 LAB — CBC WITH DIFFERENTIAL/PLATELET
Abs Immature Granulocytes: 0.04 10*3/uL (ref 0.00–0.07)
Basophils Absolute: 0 10*3/uL (ref 0.0–0.1)
Basophils Relative: 1 %
Eosinophils Absolute: 0.4 10*3/uL (ref 0.0–0.5)
Eosinophils Relative: 6 %
HCT: 24.4 % — ABNORMAL LOW (ref 39.0–52.0)
Hemoglobin: 7.7 g/dL — ABNORMAL LOW (ref 13.0–17.0)
Immature Granulocytes: 1 %
Lymphocytes Relative: 14 %
Lymphs Abs: 1.1 10*3/uL (ref 0.7–4.0)
MCH: 29.1 pg (ref 26.0–34.0)
MCHC: 31.6 g/dL (ref 30.0–36.0)
MCV: 92.1 fL (ref 80.0–100.0)
Monocytes Absolute: 0.5 10*3/uL (ref 0.1–1.0)
Monocytes Relative: 7 %
Neutro Abs: 5.6 10*3/uL (ref 1.7–7.7)
Neutrophils Relative %: 71 %
Platelets: 188 10*3/uL (ref 150–400)
RBC: 2.65 MIL/uL — ABNORMAL LOW (ref 4.22–5.81)
RDW: 14.9 % (ref 11.5–15.5)
WBC: 7.7 10*3/uL (ref 4.0–10.5)
nRBC: 0 % (ref 0.0–0.2)

## 2018-06-17 LAB — HEMOGLOBIN AND HEMATOCRIT, BLOOD
HCT: 24.4 % — ABNORMAL LOW (ref 39.0–52.0)
HCT: 25.8 % — ABNORMAL LOW (ref 39.0–52.0)
HCT: 25.8 % — ABNORMAL LOW (ref 39.0–52.0)
Hemoglobin: 7.7 g/dL — ABNORMAL LOW (ref 13.0–17.0)
Hemoglobin: 8.1 g/dL — ABNORMAL LOW (ref 13.0–17.0)
Hemoglobin: 8.1 g/dL — ABNORMAL LOW (ref 13.0–17.0)

## 2018-06-17 MED ORDER — TRAMADOL HCL 50 MG PO TABS
50.0000 mg | ORAL_TABLET | Freq: Once | ORAL | Status: AC
  Administered 2018-06-17: 50 mg via ORAL
  Filled 2018-06-17: qty 1

## 2018-06-17 NOTE — Progress Notes (Signed)
PROGRESS NOTE    Kurt [name]  ZOX:[id number] DOB: 12/25/1950 DOA: 06/14/2018 PCP: Kaleen Mask, MD    Brief Narrative:  Kurt [name] is a 68 y.o. male with medical history significant of peptic ulcer disease, diverticulosis, chronic back pain, chronic diastolic congestive heart failure, hypertension, renal insufficiency, and conditions listed below presenting to the hospital for evaluation of melena.  Patient reports 4 to 5-day history of generalized weakness, fatigue, and dyspnea on exertion.  States whenever he goes from sitting to standing position his heart starts racing and he feels very weak.  This morning (Jun 14, 2018) he noticed that his stool was dark in the morning.  He then had another bowel movement around 2:30 PM and the stool appeared black in color and he also had some abdominal cramping at that time.  Denies having any abdominal pain at present.  No nausea or vomiting.  States he had a GI bleed several years ago from a bleeding ulcer in his stomach as he was taking a lot of over-the-counter Alleve at that time.  He has now cut down significantly on the use of Aleve for this past 1 year since he started using CBD oil for chronic back pain and arthritis.  He is currently taking Aleve 1 tablet twice daily.  Denies use of any blood thinners.  States he has not had any alcoholic beverages since 1989.  Denies any fevers, chills, or cough.  He does not think he has been exposed to anyone with COVID-19.  ED Course: Blood pressure soft on arrival and orthostatics positive. Tachycardic.  FOBT positive.  Noted to have melena on rectal exam. Hemoglobin 10.6, was 13.2 nine months ago.  INR 1.1.  White count 15.2.  BUN 86.  Creatinine 2.0, was 1.7 on labs done 9 months ago.  Received IV Protonix 40 mg and 1 L normal saline bolus.  1 unit PRBCs ordered.  Additional 1 L IV fluid bolus ordered as blood pressure continues to be soft.   Assessment & Plan:   Principal Problem:   Upper GI  bleed Active Problems:   AKI (acute kidney injury) (HCC)   Symptomatic anemia   Leukocytosis   QT prolongation   Acute duodenal ulcer with bleeding   Melena   Acute upper GI bleed Acute blood loss anemia Leading to ED with 4-5-day history of generalized weakness, fatigue and dyspnea on exertion.  Also notable for dark red blood in his stool.  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  Continues on Aleve, but reports significantly cut down on use.  No history of blood thinners.  Baseline hemoglobin nine months prior to admission was 13.2; on admission hemoglobin noted to be 10.6. --[facility] consulted, appreciate assistance --s/p 1 u pRBC 5/25 --Hgb 10.6-->9.5-->9.4-->7.7-->7.7 --EGD 5/26 with mild esophagitis and partial esophageal large-caliber stricture, cratered duodenal ulcer with visible vessel status post epi injections and 2 clips placed --Biopsy for H. pylori antigen: Pending --Continues on Protonix drip --Clear liquid diet --Continue to monitor H&H every 6 hours, transfuse for hemoglobin less than 7 --Supportive care --Awaiting further GI recommendations --Avoid NSAIDs   DVT prophylaxis: SCDs, chemical DVT prophylaxis contraindicated in acute blood loss anemia/GI bleed Code Status: Full code Family Communication: none Disposition Plan: Continue inpatient hospitalization, continue to monitor hemoglobin/hematocrit, for awaiting further GI recommendations   Consultants:   [facility], Dr. Marina Goodell, Dr. Renaldo Reel  Procedures:  EGD 5/26: Impression:  1. Bleeding duodenal ulcer status post successful  endoscopic hemostatic therapy 2. GERD with endoscopic evidence of esophagitis and partial stricture.  Recommendation:  1. Continue pantoprazole IV infusion therapy 2. Avoid NSAIDs 3. Follow-up Helicobacter pylori testing 4. Clear liquid diet only  Transthoracic echocardiogram 06/16/2018: IMPRESSIONS 1. The  left ventricle has normal systolic function, with an ejection fraction of 55-60%. The cavity size was normal. Left ventricular diastolic parameters were normal. No evidence of left ventricular regional wall motion abnormalities.  2. The right ventricle has normal systolic function. The cavity was mildly enlarged. There is no increase in right ventricular wall thickness. Right ventricular systolic pressure is mildly elevated with an estimated pressure of 47.0 mmHg.  3. The tricuspid valve is grossly normal.  4. The aortic root and ascending aorta are normal in size and structure.  5. The inferior vena cava was dilated in size with <50% respiratory variability.   Antimicrobials:  none   Subjective: Patient seen and examined bedside, resting comfortably in bed.  No complaints.  Denies any further blood in his stool, although no bowel movement reported over the past 24 hours.  Denies dizziness/headache.  Further denies chest pain, no shortness of breath, no abdominal pain, no palpitations, no fever/chills/night sweats.  No acute events overnight per nursing staff.  Objective: Vitals:   06/16/18 1100 06/16/18 1321 06/16/18 2203 06/17/18 0525  BP: (!) 131/53 (!) 139/50 126/69 128/68  Pulse: 85 92 87 73  Resp: 15 18 17 14   Temp:  98.4 F (36.9 C) 99.4 F (37.4 C) 98.1 F (36.7 C)  TempSrc:   Oral Oral  SpO2: 100%  100% 96%  Weight:    (!) 144.9 kg  Height:        Intake/Output Summary (Last 24 hours) at 06/17/2018 1235 Last data filed at 06/17/2018 1100 Gross per 24 hour  Intake 4012.97 ml  Output 1100 ml  Net 2912.97 ml   Filed Weights   06/14/18 2148 06/16/18 0922 06/17/18 0525  Weight: 133.8 kg 133.8 kg (!) 144.9 kg    Examination:  General exam: Appears calm and comfortable  Respiratory system: Clear to auscultation. Respiratory effort normal. Cardiovascular system: S1 & S2 heard, RRR. No JVD, murmurs, rubs, gallops or clicks. No pedal edema.  Gastrointestinal system: Abdomen is nondistended, soft and nontender. No organomegaly or masses felt. Normal bowel sounds heard. Central nervous system: Alert and oriented. No focal neurological deficits. Extremities: Symmetric 5 x 5 power. Skin: No rashes, lesions or ulcers Psychiatry: Judgement and insight appear normal. Mood & affect appropriate.     Data Reviewed: I have personally reviewed following labs and imaging studies  CBC: Recent Labs  Lab 06/14/18 2153 06/15/18 0709 06/15/18 0924 06/17/18 0251 06/17/18 0927  WBC 15.2* 11.8* 12.5* 7.7  --   NEUTROABS  --   --   --  5.6  --   HGB 10.6* 9.5* 9.4* 7.7* 7.7*  HCT 33.7* 29.3* 28.4* 24.4* 24.4*  MCV 91.1 89.1 86.9 92.1  --   PLT 308 223 217 188  --    Basic Metabolic Panel: Recent Labs  Lab 06/14/18 2153 06/15/18 0157 06/17/18 0251  NA 134*  --  139  K 3.7  --  4.1  CL 97*  --  111  CO2 25  --  22  GLUCOSE 120*  --  100*  BUN 86*  --  22  CREATININE 2.02*  --  0.97  CALCIUM 8.5*  --  8.2*  MG  --  2.3  --    GFR: Estimated  Creatinine Clearance: 103.5 mL/min (by C-G formula based on SCr of 0.97 mg/dL). Liver Function Tests: Recent Labs  Lab 06/14/18 2153  AST 14*  ALT 13  ALKPHOS 57  BILITOT 0.6  PROT 6.3*  ALBUMIN 3.5   No results for input(s): LIPASE, AMYLASE in the last 168 hours. No results for input(s): AMMONIA in the last 168 hours. Coagulation Profile: Recent Labs  Lab 06/15/18 0010  INR 1.1   Cardiac Enzymes: No results for input(s): CKTOTAL, CKMB, CKMBINDEX, TROPONINI in the last 168 hours. BNP (last 3 results) No results for input(s): PROBNP in the last 8760 hours. HbA1C: Recent Labs    06/15/18 0900  HGBA1C 5.9*   CBG: No results for input(s): GLUCAP in the last 168 hours. Lipid Profile: No results for input(s): CHOL, HDL, LDLCALC, TRIG, CHOLHDL, LDLDIRECT in the last 72 hours. Thyroid Function Tests: No results for input(s): TSH, T4TOTAL, FREET4, T3FREE, THYROIDAB in the  last 72 hours. Anemia Panel: No results for input(s): VITAMINB12, FOLATE, FERRITIN, TIBC, IRON, RETICCTPCT in the last 72 hours. Sepsis Labs: No results for input(s): PROCALCITON, LATICACIDVEN in the last 168 hours.  Recent Results (from the past 240 hour(s))  SARS Coronavirus 2 (CEPHEID - Performed in [facility] hospital lab), Hosp Order     Status: None   Collection Time: 06/15/18  1:52 AM  Result Value Ref Range Status   SARS Coronavirus 2 NEGATIVE NEGATIVE Final    Comment: (NOTE) If result is NEGATIVE SARS-CoV-2 target nucleic acids are NOT DETECTED. The SARS-CoV-2 RNA is generally detectable in upper and lower  respiratory specimens during the acute phase of infection. The lowest  concentration of SARS-CoV-2 viral copies this assay can detect is 250  copies / mL. A negative result does not preclude SARS-CoV-2 infection  and should not be used as the sole basis for treatment or other  patient management decisions.  A negative result may occur with  improper specimen collection / handling, submission of specimen other  than nasopharyngeal swab, presence of viral mutation(s) within the  areas targeted by this assay, and inadequate number of viral copies  (<250 copies / mL). A negative result must be combined with clinical  observations, patient history, and epidemiological information. If result is POSITIVE SARS-CoV-2 target nucleic acids are DETECTED. The SARS-CoV-2 RNA is generally detectable in upper and lower  respiratory specimens dur ing the acute phase of infection.  Positive  results are indicative of active infection with SARS-CoV-2.  Clinical  correlation with patient history and other diagnostic information is  necessary to determine patient infection status.  Positive results do  not rule out bacterial infection or co-infection with other viruses. If result is PRESUMPTIVE POSTIVE SARS-CoV-2 nucleic acids MAY BE PRESENT.   A presumptive positive result was obtained  on the submitted specimen  and confirmed on repeat testing.  While 2019 novel coronavirus  (SARS-CoV-2) nucleic acids may be present in the submitted sample  additional confirmatory testing may be necessary for epidemiological  and / or clinical management purposes  to differentiate between  SARS-CoV-2 and other Sarbecovirus currently known to infect humans.  If clinically indicated additional testing with an alternate test  methodology [phone]) is advised. The SARS-CoV-2 RNA is generally  detectable in upper and lower respiratory sp ecimens during the acute  phase of infection. The expected result is Negative. Fact Sheet for Patients:  BoilerBrush.com.cy Fact Sheet for Healthcare Providers: https://pope.com/ This test is not yet approved or cleared by the [place] FDA and  has been authorized for detection and/or diagnosis of SARS-CoV-2 by FDA under an Emergency Use Authorization (EUA).  This EUA will remain in effect (meaning this test can be used) for the duration of the COVID-19 declaration under Section 564(b)(1) of the Act, 21 U.S.C. section 360bbb-3(b)(1), unless the authorization is terminated or revoked sooner. Performed at [facility], [address] [place], [place] [address]   MRSA PCR Screening     Status: None   Collection Time: 06/15/18  5:30 AM  Result Value Ref Range Status   MRSA by PCR NEGATIVE NEGATIVE Final    Comment:        The GeneXpert MRSA Assay (FDA approved for NASAL specimens only), is one component of a comprehensive MRSA colonization surveillance program. It is not intended to diagnose MRSA infection nor to guide or monitor treatment for MRSA infections. Performed at [facility], [address] [place], [place] [address]          Radiology Studies: No results found.      Scheduled Meds: Continuous Infusions: . sodium chloride 125 mL/hr at 06/17/18 1100  .  pantoprozole (PROTONIX) infusion 8 mg/hr (06/17/18 1100)     LOS: 1 day    Time spent: 31 minutes    Alvira PhilipsEric J [place], DO [facility] Pager [phone]  If 7PM-7AM, please contact night-coverage [email or web address] Password [email or web address] 06/17/2018, 12:35 PM

## 2018-06-17 NOTE — TOC Initial Note (Addendum)
Transition of Care [facility]) - Initial/Assessment Note    Patient Details  Name: Kurt [name] MRN: [id number] Date of Birth: 1950-04-02  Transition of [facility]) CM/SW Contact:    Epifanio Lesches, RN Phone Number: 06/17/2018, 9:40 AM  Clinical Narrative:         Admitted with GI bleed. From home alone. States at d/c niece Junious Dresser will be with him 24/7 and declines home health services per PT'S recommendations. Owns walker, cane.          Physical home address: [address], [place], [place] [address].  States has transportation to home and no problems affording Rx meds.  No present needs identified per NCM .... will continue to monitor. Expected Discharge Plan: Home w Home Health Services Barriers to Discharge: Continued Medical Work up   Patient Goals and CMS Choice Patient states their goals for this hospitalization and ongoing recovery are:: To get out of here CMS Medicare.gov Compare Post Acute Care list provided to:: Patient Choice offered to / list presented to : Patient(pt declined)  Expected Discharge Plan and Services Expected Discharge Plan: Home w Home Health Services In-house Referral: NA Discharge Planning Services: CM Consult Post Acute Care Choice: NA Living arrangements for the past 2 months: Single Family Home Expected Discharge Date: 06/17/18               DME Arranged: N/A DME Agency: NA       HH Arranged: (declined home health services)          Prior Living Arrangements/Services Living arrangements for the past 2 months: Single Family Home Lives with:: Self Patient language and need for interpreter reviewed:: Yes Do you feel safe going back to the place where you live?: Yes      Need for Family Participation in Patient Care: Yes (Comment) Care giver support system in place?: Yes (comment)   Criminal Activity/Legal Involvement Pertinent to Current Situation/Hospitalization: No - Comment as needed  Activities of Daily Living Home Assistive  Devices/Equipment: Cane (specify quad or straight)(both types) ADL Screening (condition at time of admission) Patient's cognitive ability adequate to safely complete daily activities?: Yes Is the patient deaf or have difficulty hearing?: No Does the patient have difficulty seeing, even when wearing glasses/contacts?: Yes Does the patient have difficulty concentrating, remembering, or making decisions?: No Patient able to express need for assistance with ADLs?: Yes Does the patient have difficulty dressing or bathing?: No Independently performs ADLs?: Yes (appropriate for developmental age) Does the patient have difficulty walking or climbing stairs?: Yes Weakness of Legs: Both Weakness of Arms/Hands: Both  Permission Sought/Granted Permission sought to share information with : Case Manager, Family Supports Permission granted to share information with : Yes, Verbal Permission Granted  Share Information with NAME: Retail banker (Daughter)Crystal Rennis Harding (Daughter)           Emotional Assessment Appearance:: Appears stated age Attitude/Demeanor/Rapport: Engaged Affect (typically observed): Accepting Orientation: : Oriented to Self, Oriented to Place, Oriented to  Time, Oriented to Situation Alcohol / Substance Use: Not Applicable Psych Involvement: No (comment)  Admission diagnosis:  Leukocytosis [D72.829] Patient Active Problem List   Diagnosis Date Noted  . Melena 06/16/2018  . Acute duodenal ulcer with bleeding   . Symptomatic anemia 06/15/2018  . Leukocytosis 06/15/2018  . QT prolongation 06/15/2018  . 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 GI bleed 04/29/2014  . AKI (acute kidney injury) (HCC) 04/29/2014  .  Upper GI bleed 04/29/2014  . TIA (transient ischemic attack) 07/08/2012  . Diverticulitis 07/08/2012  . Hypertension 07/08/2012   PCP:  Kaleen MaskElkins, Wilson Oliver, MD Pharmacy:    [facility] [address] - [place], [place] - [address] [place] [address] AT [facility] [address] [place] [address] [place] [address] Phone: [phone] Fax: [phone]     Social Determinants of Health (SDOH) Interventions    Readmission Risk Interventions No flowsheet data found.

## 2018-06-17 NOTE — Progress Notes (Addendum)
Progress Note    ASSESSMENT AND PLAN:     1. 68 yo male with upper GI bleed secondary to duodenal ulcer with visible vessel s/p epi injection / clip placement.  No evidence for ongoing bleeding just one dark, formed stool this am -avoid NSAIDS -await gastric biopsy for H.pylori.  -PPI gtt until tomorrow then change to PO BID -advance diet   2. ABL anemia. Baseline hgb ~ 13, down to 9.5 at which time he received a unit of blood.  Hgb since declined further but stable overnight at 7.7.  -Continue to monitor Hgb, transfuse as needed. Currently asymptomatic  2. GERD with partial stricture. Actually says he has no reflux  Symptoms at home.  -Daily PPI at home.                SUBJECTIVE   No complaints. Wants more than clear liquids to eat. One dark, formed stool BM this am.     OBJECTIVE:     Vital signs in last 24 hours: Temp:  [98.1 F (36.7 C)-99.4 F (37.4 C)] 98.1 F (36.7 C) (05/27 0525) Pulse Rate:  [73-87] 73 (05/27 0525) Resp:  [14-17] 14 (05/27 0525) BP: (126-128)/(68-69) 128/68 (05/27 0525) SpO2:  [96 %-100 %] 96 % (05/27 0525) Weight:  [144.9 kg] 144.9 kg (05/27 0525) Last BM Date: 06/15/18 General:   Alert, male in NAD EENT:  Normal hearing, non icteric sclera, conjunctive pink.  Heart:  Regular rate and rhythm.  No lower extremity edema   Pulm: Normal respiratory effort, lungs CTA bilaterally without wheezes or crackles. Abdomen:  Soft, nondistended, nontender.  Normal bowel sounds,.       Neurologic:  Alert and  oriented x4;  grossly normal neurologically. Psych:  Pleasant, cooperative.  Normal mood and affect.   Intake/Output from previous day: 05/26 0701 - 05/27 0700 In: 3650.4 [P.O.:360; I.V.:3290.4] Out: 1205 [Urine:1200; Blood:5] Intake/Output this shift: Total I/O In: 1162.6 [I.V.:1162.6] Out: -   Lab Results: Recent Labs    06/15/18 0709 06/15/18 0924 06/17/18 0251 06/17/18 0927  WBC 11.8* 12.5* 7.7  --   HGB 9.5* 9.4* 7.7*  7.7*  HCT 29.3* 28.4* 24.4* 24.4*  PLT 223 217 188  --    BMET Recent Labs    06/14/18 2153 06/17/18 0251  NA 134* 139  K 3.7 4.1  CL 97* 111  CO2 25 22  GLUCOSE 120* 100*  BUN 86* 22  CREATININE 2.02* 0.97  CALCIUM 8.5* 8.2*   LFT Recent Labs    06/14/18 2153  PROT 6.3*  ALBUMIN 3.5  AST 14*  ALT 13  ALKPHOS 57  BILITOT 0.6   PT/INR Recent Labs    06/15/18 0010  LABPROT 13.9  INR 1.1   Hepatitis Panel No results for input(s): HEPBSAG, HCVAB, HEPAIGM, HEPBIGM in the last 72 hours.  No results found.   LOS: 1 day   Willette ClusterPaula Guenther ,NP 06/17/2018, 2:19 PM   GI ATTENDING  Interval history data reviewed.  Patient seen and examined.  Agree with interval progress note as outlined above.  Acutely bleeding duodenal ulcer requiring endoscopic hemostatic therapy.  Currently stable without evidence of rebleeding.  Agree with continuing IV PPI and continue to monitor stools and hemoglobin.  Transfuse as needed for threshold hemoglobin level.  He needs to avoid unnecessary NSAIDs as outpatient.  He should discuss NSAID alternatives for his arthritic pain with his PCP.  Biopsies for Helicobacter pylori were NEGATIVE.  Advance diet.  We  will follow.  Wilhemina Bonito. Eda Keys., M.D. [facility] Division of Gastroenterology

## 2018-06-17 NOTE — Progress Notes (Signed)
Physical Therapy Treatment Patient Details Name: Kurt [name] MRN: [id number] DOB: 05-31-1950 Today's Date: 06/17/2018    History of Present Illness Patient is a 68 y/o male who presents with generalized weakness, melena, fatigue, DOE. Found to have acute GI bleed. PMH includes PTSD, HTN, depression, CHF, TIA.     PT Comments    Patient seen for mobility progression. Pt is making progress toward PT goals and tolerated gait distance of 100 ft before fatigued. Pt SOB with mobility. HR and SpO2 WNL during session. Pt requires min guard/min A for OOB mobility due to impaired balance and decreased activity tolerance. Current plan remains appropriate.     Follow Up Recommendations  Home health PT;Supervision for mobility/OOB;Supervision/Assistance - 24 hour     Equipment Recommendations  None recommended by PT (pt reports having RW and cane at home)   Recommendations for Other Services       Precautions / Restrictions Precautions Precautions: Fall Precaution Comments: per notes, orthostatic    Mobility  Bed Mobility               General bed mobility comments: pt OOB in chair upon arrival  Transfers Overall transfer level: Needs assistance Equipment used: None Transfers: Sit to/from Stand Sit to Stand: Min guard         General transfer comment: min guard for safety; pt unsteady initially but no phsycial assist required  Ambulation/Gait Ambulation/Gait assistance: Min assist;Min guard Gait Distance (Feet): 100 Feet Assistive device: None(rail in hallway/"furniture walk" at times) Gait Pattern/deviations: Wide base of support;Decreased stride length Gait velocity: decreased   General Gait Details: lateral sway likely due to body habitus; unsteady but no overt LOB; benefits from single UE support and pt reaching for objects at times; pt fatigued and SOB with ambulation    Stairs             Wheelchair Mobility    Modified Rankin (Stroke Patients Only)       Balance Overall balance assessment: Needs assistance Sitting-balance support: Feet supported;No upper extremity supported Sitting balance-Leahy Scale: Fair     Standing balance support: During functional activity Standing balance-Leahy Scale: Fair                              Cognition Arousal/Alertness: Awake/alert Behavior During Therapy: WFL for tasks assessed/performed Overall Cognitive Status: Within Functional Limits for tasks assessed                                        Exercises      General Comments General comments (skin integrity, edema, etc.): HR and SpO2 WNL       Pertinent Vitals/Pain Pain Assessment: Faces Faces Pain Scale: Hurts a little bit Pain Location: L knee and "all over" Pain Descriptors / Indicators: Aching;Guarding Pain Intervention(s): Limited activity within patient's tolerance;Monitored during session;Repositioned    Home Living                      Prior Function            PT Goals (current goals can now be found in the care plan section) Progress towards PT goals: Progressing toward goals    Frequency    Min 3X/week      PT Plan Current plan remains appropriate    Co-evaluation  AM-PAC PT "6 Clicks" Mobility   Outcome Measure  Help needed turning from your back to your side while in a flat bed without using bedrails?: None Help needed moving from lying on your back to sitting on the side of a flat bed without using bedrails?: None Help needed moving to and from a bed to a chair (including a wheelchair)?: A Little Help needed standing up from a chair using your arms (e.g., wheelchair or bedside chair)?: None Help needed to walk in hospital room?: A Little Help needed climbing 3-5 steps with a railing? : A Little 6 Click Score: 21    End of Session Equipment Utilized During Treatment: Gait belt Activity Tolerance: Patient tolerated treatment well Patient  left: in chair;with call bell/phone within reach Nurse Communication: Mobility status PT Visit Diagnosis: Muscle weakness (generalized) (M62.81);Unsteadiness on feet (R26.81);Difficulty in walking, not elsewhere classified (R26.2);Pain Pain - part of body: (headache)     Time: 1514-1530 PT Time Calculation (min) (ACUTE ONLY): [id number]  Charges:  $Gait Training: 8-22 mins                     Erline LevineKellyn Paige Vanderwoude, PTA Acute Rehabilitation Services Pager: [phone] Office: [phone]     Carolynne EdouardKellyn R Jakel Alphin 06/17/2018, 4:18 PM

## 2018-06-18 DIAGNOSIS — K264 Chronic or unspecified duodenal ulcer with hemorrhage: Secondary | ICD-10-CM

## 2018-06-18 LAB — CBC WITH DIFFERENTIAL/PLATELET
Abs Immature Granulocytes: 0.06 10*3/uL (ref 0.00–0.07)
Basophils Absolute: 0.1 10*3/uL (ref 0.0–0.1)
Basophils Relative: 1 %
Eosinophils Absolute: 0.5 10*3/uL (ref 0.0–0.5)
Eosinophils Relative: 6 %
HCT: 26.2 % — ABNORMAL LOW (ref 39.0–52.0)
Hemoglobin: 8.4 g/dL — ABNORMAL LOW (ref 13.0–17.0)
Immature Granulocytes: 1 %
Lymphocytes Relative: 14 %
Lymphs Abs: 1.3 10*3/uL (ref 0.7–4.0)
MCH: 29.3 pg (ref 26.0–34.0)
MCHC: 32.1 g/dL (ref 30.0–36.0)
MCV: 91.3 fL (ref 80.0–100.0)
Monocytes Absolute: 0.6 10*3/uL (ref 0.1–1.0)
Monocytes Relative: 7 %
Neutro Abs: 6.5 10*3/uL (ref 1.7–7.7)
Neutrophils Relative %: 71 %
Platelets: 227 10*3/uL (ref 150–400)
RBC: 2.87 MIL/uL — ABNORMAL LOW (ref 4.22–5.81)
RDW: 14.8 % (ref 11.5–15.5)
WBC: 9 10*3/uL (ref 4.0–10.5)
nRBC: 0 % (ref 0.0–0.2)

## 2018-06-18 LAB — BASIC METABOLIC PANEL
Anion gap: 6 (ref 5–15)
BUN: 13 mg/dL (ref 8–23)
CO2: 23 mmol/L (ref 22–32)
Calcium: 8.5 mg/dL — ABNORMAL LOW (ref 8.9–10.3)
Chloride: 109 mmol/L (ref 98–111)
Creatinine, Ser: 1.23 mg/dL (ref 0.61–1.24)
GFR calc Af Amer: >60 mL/min (ref >60)
GFR calc non Af Amer: 60 mL/min — ABNORMAL LOW (ref >60)
Glucose, Bld: 102 mg/dL — ABNORMAL HIGH (ref 70–99)
Potassium: 4 mmol/L (ref 3.5–5.1)
Sodium: 138 mmol/L (ref 135–145)

## 2018-06-18 MED ORDER — PANTOPRAZOLE SODIUM 40 MG PO TBEC
40.0000 mg | DELAYED_RELEASE_TABLET | Freq: Every day | ORAL | Status: DC
  Administered 2018-06-18: 40 mg via ORAL
  Filled 2018-06-18: qty 1

## 2018-06-18 MED ORDER — PANTOPRAZOLE SODIUM 40 MG PO TBEC: 40.0000 mg | DELAYED_RELEASE_TABLET | Freq: Two times a day (BID) | ORAL | 0 refills | Status: DC

## 2018-06-18 NOTE — Discharge Summary (Signed)
Physician Discharge Summary  Kurt [name] WJX:[id number] DOB: [birth year] DOA: 06/14/2018  PCP: Kaleen Mask, MD  Admit date: 06/14/2018 Discharge date: 06/18/2018  Admitted From: Home Disposition:  Home  Recommendations for Outpatient Follow-up:  1. Follow up with PCP in 1 week 2. Please obtain CBC in one week 3. Follow-up with gastroenterology as scheduled 4. Avoid all NSAIDs in the future, may need alternate treatment modality for his osteoarthritis  Home Health: Declined home health services Equipment/Devices: None  Discharge Condition: Stable CODE STATUS: Full code Diet recommendation: Heart Healthy   History of present illness:  Kurt Hitchner Dixonis a 68 y.o.malewith medical history significant ofpeptic ulcer disease, diverticulosis,chronic back pain,chronic diastolic congestive heart failure, hypertension, renal insufficiency, and conditions listed below presenting to the hospital for evaluation of melena.Patient reports 4 to 5-day history of generalized weakness, fatigue, and dyspnea on exertion. States whenever he goes from sitting to standing position his heart starts racing and he feels very weak. Thismorning(Jun 14, 2018)he noticed that his stool was dark in the morning. He then had another bowel movement around 2:30 PM and the stool appeared black in color and he also had some abdominal cramping at that time. Denies having any abdominal pain at present. No nausea or vomiting. States he had a GI bleed several years ago from a bleeding ulcer in his stomach as he was taking a lot of over-the-counter Alleveat that time. He has nowcut down significantly on the use of Aleve for this past 1 year since he started using CBD oil for chronic back pain and arthritis. He iscurrently taking Aleve 1 tablet twice daily.Denies use of any blood thinners. States he has not had any alcoholic beverages since 1989. Denies any fevers, chills, or cough. He does not think he  has been exposed to anyone with COVID-19.  ED Course:Blood pressure soft on arrival and orthostaticspositive. Tachycardic. FOBT positive. Noted to have melena on rectal exam. Hemoglobin 10.6, was 13.2 ninemonths ago. INR 1.1. White count 15.2. BUN 86. Creatinine 2.0, was 1.7 on labs done 9 months ago. Received IV Protonix 40 mg and 1 L normal saline bolus.1 unit PRBCs ordered. Additional 1 L IV fluid bolus ordered as blood pressure continues to be soft.  Hospital course:  Acute upper GI bleed Acute blood loss anemia Leading to ED with 4-5-day history of generalized weakness, fatigue and dyspnea on exertion.  Also notable for dark red blood in his stool.  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  Continues on Aleve, but reports significantly cut down on use.  No history of blood thinners.  Baseline hemoglobin nine months prior to admission was 13.2; on admission hemoglobin noted to be 10.6. [place] [facility] consulted.  Patient was transfused 1 unit PRBCs on 06/15/2018. He underwent EGD on 06/16/2018 with mild esophagitis and partial esophageal large-caliber stricture, cratered duodenal ulcer with visible vessel status post epi injections and 2 clips placed.  Biopsy for H. pylori antigen was negative.  He was supported on a Protonix drip for 48 hours and transition to Protonix 40 mg p.o. twice daily.  Patient was encouraged to avoid all NSAIDs in the future.  Hemoglobin at time of discharge 8.4.  Patient to follow-up with gastroenterology outpatient as scheduled.  Discharge Diagnoses:  Active Problems:   QT prolongation    Discharge Instructions  Discharge Instructions    Call MD for:  difficulty breathing, headache or visual disturbances   Complete by:  As directed    Call  MD for:  extreme fatigue   Complete by:  As directed    Call MD for:  persistant dizziness or light-headedness   Complete by:  As directed    Call MD for:  persistant nausea and  vomiting   Complete by:  As directed    Call MD for:  severe uncontrolled pain   Complete by:  As directed    Call MD for:  temperature >100.4   Complete by:  As directed    Diet - low sodium heart healthy   Complete by:  As directed    Increase activity slowly   Complete by:  As directed      Allergies as of 06/18/2018      Reactions   No Known Allergies       Medication List    STOP taking these medications   cyclobenzaprine 10 MG tablet Commonly known as:  FLEXERIL   HYDROcodone-acetaminophen 5-325 MG tablet Commonly known as:  NORCO/VICODIN   traMADol 50 MG tablet Commonly known as:  ULTRAM     TAKE these medications   acetaminophen 650 MG CR tablet Commonly known as:  TYLENOL Take 1,300 mg by mouth every 8 (eight) hours as needed for pain.   allopurinol 100 MG tablet Commonly known as:  ZYLOPRIM Take 100 mg by mouth 2 (two) times a day.   amLODipine 2.5 MG tablet Commonly known as:  NORVASC Take 2.5 mg by mouth daily.   fluticasone 50 MCG/ACT nasal spray Commonly known as:  FLONASE Place 2 sprays into both nostrils daily as needed for allergies or rhinitis.   furosemide 80 MG tablet Commonly known as:  LASIX Take 40 mg by mouth 2 (two) times daily.   lisinopril 20 MG tablet Commonly known as:  ZESTRIL Take 20 mg by mouth daily.   MURINE TEARS FOR DRY EYES OP Place 2 drops into both eyes as needed (for dry eyes).   pantoprazole 40 MG tablet Commonly known as:  PROTONIX Take 1 tablet (40 mg total) by mouth 2 (two) times daily for 30 days.   ranitidine 150 MG tablet Commonly known as:  ZANTAC Take 150 mg by mouth 2 (two) times daily as needed for heartburn.   sertraline 100 MG tablet Commonly known as:  ZOLOFT Take 1 tablet (100 mg total) by mouth daily. What changed:    how much to take  when to take this   zolpidem 5 MG tablet Commonly known as:  AMBIEN Take 5 mg by mouth at bedtime as needed for sleep.      Follow-up Information     Kaleen Mask, MD. Schedule an appointment as soon as possible for a visit in 1 week(s).   Specialty:  Family Medicine Contact information: [address] [place] [address] [phone]          Allergies  Allergen Reactions  . No Known Allergies     Consultations:  [facility]   Procedures/Studies: Dg Chest 2 View  Result Date: 06/15/2018 CLINICAL DATA:  Initial evaluation for acute leukocytosis. EXAM: CHEST - 2 VIEW COMPARISON:  Prior radiograph from 04/29/2014 FINDINGS: Cardiac and mediastinal silhouettes are stable in size and contour, and remain within normal limits. Lungs mildly hypoinflated. Mild streaky bibasilar subsegmental atelectasis. No consolidative opacity to suggest pneumonia. No pulmonary edema or definite pleural effusion, although the costophrenic angles are incompletely visualized. No pneumothorax. No acute osseous finding. Degenerative changes noted about the shoulders bilaterally. IMPRESSION: 1. Shallow lung inflation with mild  streaky bibasilar subsegmental atelectasis. 2. No other active cardiopulmonary disease. Electronically Signed   By: Rise Mu M.D.   On: 06/15/2018 04:57    EGD 5/26: Impression:  1. Bleeding duodenal ulcer status post successful endoscopic hemostatic therapy 2. GERD with endoscopic evidence of esophagitis and partial stricture.  Recommendation:  1. Continue pantoprazole IV infusion therapy 2. Avoid NSAIDs 3. Follow-up Helicobacter pylori testing 4. Clear liquid diet only  Transthoracic echocardiogram 06/16/2018: IMPRESSIONS 1. The left ventricle has normal systolic function, with an ejection fraction of 55-60%. The cavity size was normal. Left ventricular diastolic parameters were normal. No evidence of left ventricular regional wall motion abnormalities. 2. The right ventricle has normal systolic function. The cavity was mildly enlarged. There is no increase in  right ventricular wall thickness. Right ventricular systolic pressure is mildly elevated with an estimated pressure of 47.0 mmHg. 3. The tricuspid valve is grossly normal. 4. The aortic root and ascending aorta are normal in size and structure. 5. The inferior vena cava was dilated in size with <50% respiratory variability.   Subjective: Patient seen and examined bedside, resting comfortably in bed.  No complaints.  Denies any further blood in his stool.  Hemoglobin stable, up to 8.4 today. Denies dizziness/headache.  Further denies chest pain, no shortness of breath, no abdominal pain, no palpitations, no fever/chills/night sweats.  No acute events overnight per nursing staff.  Discharge Exam: Vitals:   06/17/18 1423 06/17/18 2136  BP: (!) 132/53 117/63  Pulse: 70 72  Resp: 18   Temp: 99.3 F (37.4 C) 98.6 F (37 C)  SpO2: 97% 100%   Vitals:   06/17/18 0525 06/17/18 1423 06/17/18 2136 06/18/18 0516  BP: 128/68 (!) 132/53 117/63   Pulse: 73 70 72   Resp: 14 18    Temp: 98.1 F (36.7 C) 99.3 F (37.4 C) 98.6 F (37 C)   TempSrc: Oral Oral Oral   SpO2: 96% 97% 100%   Weight: (!) 144.9 kg   (!) 143.7 kg  Height:        General: Pt is alert, awake, not in acute distress Cardiovascular: RRR, S1/S2 +, no rubs, no gallops Respiratory: CTA bilaterally, no wheezing, no rhonchi Abdominal: Soft, NT, ND, bowel sounds + Extremities: no edema, no cyanosis    The results of significant diagnostics from this hospitalization (including imaging, microbiology, ancillary and laboratory) are listed below for reference.     Microbiology: Recent Results (from the past 240 hour(s))  SARS Coronavirus 2 (CEPHEID - Performed in [facility] hospital lab), Hosp Order     Status: None   Collection Time: 06/15/18  1:52 AM  Result Value Ref Range Status   SARS Coronavirus 2 NEGATIVE NEGATIVE Final    Comment: (NOTE) If result is NEGATIVE SARS-CoV-2 target nucleic acids are NOT DETECTED. The  SARS-CoV-2 RNA is generally detectable in upper and lower  respiratory specimens during the acute phase of infection. The lowest  concentration of SARS-CoV-2 viral copies this assay can detect is 250  copies / mL. A negative result does not preclude SARS-CoV-2 infection  and should not be used as the sole basis for treatment or other  patient management decisions.  A negative result may occur with  improper specimen collection / handling, submission of specimen other  than nasopharyngeal swab, presence of viral mutation(s) within the  areas targeted by this assay, and inadequate number of viral copies  (<250 copies / mL). A negative result must be combined with clinical  observations,  patient history, and epidemiological information. If result is POSITIVE SARS-CoV-2 target nucleic acids are DETECTED. The SARS-CoV-2 RNA is generally detectable in upper and lower  respiratory specimens dur ing the acute phase of infection.  Positive  results are indicative of active infection with SARS-CoV-2.  Clinical  correlation with patient history and other diagnostic information is  necessary to determine patient infection status.  Positive results do  not rule out bacterial infection or co-infection with other viruses. If result is PRESUMPTIVE POSTIVE SARS-CoV-2 nucleic acids MAY BE PRESENT.   A presumptive positive result was obtained on the submitted specimen  and confirmed on repeat testing.  While 2019 novel coronavirus  (SARS-CoV-2) nucleic acids may be present in the submitted sample  additional confirmatory testing may be necessary for epidemiological  and / or clinical management purposes  to differentiate between  SARS-CoV-2 and other Sarbecovirus currently known to infect humans.  If clinically indicated additional testing with an alternate test  methodology [phone]) is advised. The SARS-CoV-2 RNA is generally  detectable in upper and lower respiratory sp ecimens during the acute   phase of infection. The expected result is Negative. Fact Sheet for Patients:  BoilerBrush.com.cy Fact Sheet for Healthcare Providers: https://pope.com/ This test is not yet approved or cleared by the [place] FDA and has been authorized for detection and/or diagnosis of SARS-CoV-2 by FDA under an Emergency Use Authorization (EUA).  This EUA will remain in effect (meaning this test can be used) for the duration of the COVID-19 declaration under Section 564(b)(1) of the Act, 21 U.S.C. section 360bbb-3(b)(1), unless the authorization is terminated or revoked sooner. Performed at [facility], [address], [place], [place] [address]   MRSA PCR Screening     Status: None   Collection Time: 06/15/18  5:30 AM  Result Value Ref Range Status   MRSA by PCR NEGATIVE NEGATIVE Final    Comment:        The GeneXpert MRSA Assay (FDA approved for NASAL specimens only), is one component of a comprehensive MRSA colonization surveillance program. It is not intended to diagnose MRSA infection nor to guide or monitor treatment for MRSA infections. Performed at [facility], [address], [place], [place] [address]      Labs: BNP (last 3 results) No results for input(s): BNP in the last 8760 hours. Basic Metabolic Panel: Recent Labs  Lab 06/14/18 2153 06/15/18 0157 06/17/18 0251 06/18/18 0225  NA 134*  --  139 138  K 3.7  --  4.1 4.0  CL 97*  --  111 109  CO2 25  --  22 23  GLUCOSE 120*  --  100* 102*  BUN 86*  --  22 13  CREATININE 2.02*  --  0.97 1.23  CALCIUM 8.5*  --  8.2* 8.5*  MG  --  2.3  --   --    Liver Function Tests: Recent Labs  Lab 06/14/18 2153  AST 14*  ALT 13  ALKPHOS 57  BILITOT 0.6  PROT 6.3*  ALBUMIN 3.5   No results for input(s): LIPASE, AMYLASE in the last 168 hours. No results for input(s): AMMONIA in the last 168 hours. CBC: Recent Labs  Lab 06/14/18 2153 06/15/18 0709  06/15/18 0924 06/17/18 0251 06/17/18 0927 06/17/18 1510 06/17/18 2041 06/18/18 0225  WBC 15.2* 11.8* 12.5* 7.7  --   --   --  9.0  NEUTROABS  --   --   --  5.6  --   --   --  6.5  HGB 10.6* 9.5* 9.4* 7.7* 7.7* 8.1* 8.1* 8.4*  HCT 33.7* 29.3* 28.4* 24.4* 24.4* 25.8* 25.8* 26.2*  MCV 91.1 89.1 86.9 92.1  --   --   --  91.3  PLT 308 223 217 188  --   --   --  227   Cardiac Enzymes: No results for input(s): CKTOTAL, CKMB, CKMBINDEX, TROPONINI in the last 168 hours. BNP: Invalid input(s): POCBNP CBG: No results for input(s): GLUCAP in the last 168 hours. D-Dimer No results for input(s): DDIMER in the last 72 hours. Hgb A1c No results for input(s): HGBA1C in the last 72 hours. Lipid Profile No results for input(s): CHOL, HDL, LDLCALC, TRIG, CHOLHDL, LDLDIRECT in the last 72 hours. Thyroid function studies No results for input(s): TSH, T4TOTAL, T3FREE, THYROIDAB in the last 72 hours.  Invalid input(s): FREET3 Anemia work up No results for input(s): VITAMINB12, FOLATE, FERRITIN, TIBC, IRON, RETICCTPCT in the last 72 hours. Urinalysis    Component Value Date/Time   COLORURINE YELLOW 06/15/2018 0350   APPEARANCEUR CLEAR 06/15/2018 0350   LABSPEC 1.014 06/15/2018 0350   PHURINE 5.0 06/15/2018 0350   GLUCOSEU NEGATIVE 06/15/2018 0350   HGBUR NEGATIVE 06/15/2018 0350   BILIRUBINUR NEGATIVE 06/15/2018 0350   KETONESUR NEGATIVE 06/15/2018 0350   PROTEINUR NEGATIVE 06/15/2018 0350   UROBILINOGEN 1.0 07/09/2012 0010   NITRITE NEGATIVE 06/15/2018 0350   LEUKOCYTESUR NEGATIVE 06/15/2018 0350   Sepsis Labs Invalid input(s): PROCALCITONIN,  WBC,  LACTICIDVEN Microbiology Recent Results (from the past 240 hour(s))  SARS Coronavirus 2 (CEPHEID - Performed in [facility] hospital lab), Hosp Order     Status: None   Collection Time: 06/15/18  1:52 AM  Result Value Ref Range Status   SARS Coronavirus 2 NEGATIVE NEGATIVE Final    Comment: (NOTE) If result is NEGATIVE SARS-CoV-2 target  nucleic acids are NOT DETECTED. The SARS-CoV-2 RNA is generally detectable in upper and lower  respiratory specimens during the acute phase of infection. The lowest  concentration of SARS-CoV-2 viral copies this assay can detect is 250  copies / mL. A negative result does not preclude SARS-CoV-2 infection  and should not be used as the sole basis for treatment or other  patient management decisions.  A negative result may occur with  improper specimen collection / handling, submission of specimen other  than nasopharyngeal swab, presence of viral mutation(s) within the  areas targeted by this assay, and inadequate number of viral copies  (<250 copies / mL). A negative result must be combined with clinical  observations, patient history, and epidemiological information. If result is POSITIVE SARS-CoV-2 target nucleic acids are DETECTED. The SARS-CoV-2 RNA is generally detectable in upper and lower  respiratory specimens dur ing the acute phase of infection.  Positive  results are indicative of active infection with SARS-CoV-2.  Clinical  correlation with patient history and other diagnostic information is  necessary to determine patient infection status.  Positive results do  not rule out bacterial infection or co-infection with other viruses. If result is PRESUMPTIVE POSTIVE SARS-CoV-2 nucleic acids MAY BE PRESENT.   A presumptive positive result was obtained on the submitted specimen  and confirmed on repeat testing.  While 2019 novel coronavirus  (SARS-CoV-2) nucleic acids may be present in the submitted sample  additional confirmatory testing may be necessary for epidemiological  and / or clinical management purposes  to differentiate between  SARS-CoV-2 and other Sarbecovirus currently known to infect humans.  If clinically indicated additional testing with an  alternate test  methodology [phone]([id number]) is advised. The SARS-CoV-2 RNA is generally  detectable in upper and lower  respiratory sp ecimens during the acute  phase of infection. The expected result is Negative. Fact Sheet for Patients:  BoilerBrush.com.cyhttps://www.fda.gov/media/136312/download Fact Sheet for Healthcare Providers: https://pope.com/https://www.fda.gov/media/136313/download This test is not yet approved or cleared by the [place] FDA and has been authorized for detection and/or diagnosis of SARS-CoV-2 by FDA under an Emergency Use Authorization (EUA).  This EUA will remain in effect (meaning this test can be used) for the duration of the COVID-19 declaration under Section 564(b)(1) of the Act, 21 U.S.C. section 360bbb-3(b)(1), unless the authorization is terminated or revoked sooner. Performed at [facility], [address] [place], [place] [address]   MRSA PCR Screening     Status: None   Collection Time: 06/15/18  5:30 AM  Result Value Ref Range Status   MRSA by PCR NEGATIVE NEGATIVE Final    Comment:        The GeneXpert MRSA Assay (FDA approved for NASAL specimens only), is one component of a comprehensive MRSA colonization surveillance program. It is not intended to diagnose MRSA infection nor to guide or monitor treatment for MRSA infections. Performed at [facility], [address] [place], [place] [address]      Time coordinating discharge: Over 30 minutes  SIGNED:   Alvira PhilipsEric J [place], DO  [facility] 06/18/2018, 10:41 AM

## 2018-06-18 NOTE — TOC Transition Note (Signed)
Transition of Care [facility]) - CM/SW Discharge Note   Patient Details  Name: Kurt [name] MRN: [id number] Date of Birth: 02/25/1950  [facility] CM/SW Contact:  Epifanio Lesches, RN Phone Number: 06/18/2018, 9:54 AM   Clinical Narrative:    Per MD pt will d/c today. Pt will d/c to home with self care. States he has niece to help care for him. Declined home health services. States has transportation to home and can afford Rx meds.   Final next level of care: Home/Self Care(DECLINED home health services) Barriers to Discharge: Barriers Resolved   Patient Goals and CMS Choice Patient states their goals for this hospitalization and ongoing recovery are:: To get out of here CMS Medicare.gov Compare Post Acute Care list provided to:: Patient Choice offered to / list presented to : Patient(pt declined)  Discharge Placement                       Discharge Plan and Services In-house Referral: NA Discharge Planning Services: CM Consult Post Acute Care Choice: NA          DME Arranged: N/A DME Agency: NA       HH Arranged: (declined home health services)          Social Determinants of Health (SDOH) Interventions     Readmission Risk Interventions No flowsheet data found.

## 2018-06-18 NOTE — Progress Notes (Signed)
Kurt [name] to be D/C'd Home per MD order.  Discussed with the patient and all questions fully answered.  VSS, Skin clean, dry and intact without evidence of skin break down, no evidence of skin tears noted. IV catheter discontinued intact. Site without signs and symptoms of complications. Dressing and pressure applied.  An After Visit Summary was printed and given to the patient. Patient received prescription.  D/c education completed with patient/family including follow up instructions, medication list, d/c activities limitations if indicated, with other d/c instructions as indicated by MD - patient able to verbalize understanding, all questions fully answered.   Patient instructed to return to ED, call 911, or call MD for any changes in condition.   Patient escorted via WC, and D/C home via private auto.  [name] 06/18/2018 2:38 PM

## 2018-06-18 NOTE — Discharge Instructions (Signed)
Peptic Ulcer ° °A peptic ulcer is a sore in the lining of the stomach (gastric ulcer) or the first part of the small intestine (duodenal ulcer). The ulcer causes a gradual wearing away (erosion) of the deeper tissue. °What are the causes? °Normally, the lining of the stomach and the small intestine protects them from the acid that digests food. The protective lining can be damaged by: °· An infection caused by a type of bacteria called Helicobacter pylori or H. pylori. °· Regular use of NSAIDs, such as ibuprofen or aspirin. °· Rare tumors in the stomach, small intestine, or pancreas (Zollinger-Ellison syndrome). °What increases the risk? °The following factors may make you more likely to develop this condition: °· Smoking. °· Having a family history of ulcer disease. °· Drinking alcohol. °· Having been hospitalized in an intensive care unit (ICU). °What are the signs or symptoms? °Symptoms of this condition include: °· Persistent burning pain in the area between the chest and the belly button. The pain may be worse on an empty stomach and at night. °· Heartburn. °· Nausea and vomiting. °· Bloating. °If the ulcer results in bleeding, it can cause: °· Black, tarry stools. °· Vomiting of bright red blood. °· Vomiting of material that looks like coffee grounds. °How is this diagnosed? °This condition may be diagnosed based on: °· Your medical history and a physical exam. °· Various tests or procedures, such as: °? Blood tests, stool tests, or breath tests to check for the H. pylori bacteria. °? An X-ray exam (upper gastrointestinal series) of the esophagus, stomach, and small intestine. °? Upper endoscopy. The health care provider examines the esophagus, stomach, and small intestine using a small flexible tube that has a video camera at the end. °? Biopsy. A tissue sample is removed to be examined under a microscope. °How is this treated? °Treatment for this condition may include: °· Eliminating the cause of the ulcer,  such as smoking or use of NSAIDs, and limiting alcohol and caffeine intake. °· Medicines to reduce the amount of acid in your digestive tract. °· Antibiotic medicines, if the ulcer is caused by an H. pylori infection. °· An upper endoscopy may be used to treat a bleeding ulcer. °· Surgery. This may be needed if the bleeding is severe or if the ulcer created a hole somewhere in the digestive system. °Follow these instructions at home: °· Do not drink alcohol if your health care provider tells you not to drink. °· Do not use any products that contain nicotine or tobacco, such as cigarettes, e-cigarettes, and chewing tobacco. If you need help quitting, ask your health care provider. °· Take over-the-counter and prescription medicines only as told by your health care provider. °? Do not use over-the-counter medicines in place of prescription medicines unless your health care provider approves. °? Do not take aspirin, ibuprofen, or other NSAIDs unless your health care provider told you to do so. °· Take over-the-counter and prescription medicines only as told by your health care provider. °· Keep all follow-up visits as told by your health care provider. This is important. °Contact a health care provider if: °· Your symptoms do not improve within 7 days of starting treatment. °· You have ongoing indigestion or heartburn. °Get help right away if: °· You have sudden, sharp, or persistent pain in your abdomen. °· You have bloody or dark black, tarry stools. °· You vomit blood or material that looks like coffee grounds. °· You become light-headed or you feel faint. °·   You become weak. °· You become sweaty or clammy. °Summary °· A peptic ulcer is a sore in the lining of the stomach (gastric ulcer) or the first part of the small intestine (duodenal ulcer). The ulcer causes a gradual wearing away (erosion) of the deeper tissue. °· Do not use any products that contain nicotine or tobacco, such as cigarettes, e-cigarettes, and  chewing tobacco. If you need help quitting, ask your health care provider. °· Take over-the-counter and prescription medicines only as told by your health care provider. Do not use over-the-counter medicines in place of prescription medicines unless your health care provider approves. °· Contact your health care provider if you have ongoing indigestion or heartburn. °· Keep all follow-up visits as told by your health care provider. This is important. °This information is not intended to replace advice given to you by your health care provider. Make sure you discuss any questions you have with your health care provider. °Document Released: 01/05/2000 Document Revised: 07/15/2017 Document Reviewed: 07/15/2017 °Elsevier Interactive Patient Education © 2019 [employer] ° °

## 2018-06-18 NOTE — Progress Notes (Addendum)
     Progress Note    ASSESSMENT AND PLAN:   1. Upper GI bleed secondary to duodenal ulcer with visible vessel s/p epi injection / clip placement.  No evidence for ongoing bleeding. Gastric bx >> chronic gastritis, negative H.pylori -avoid NSAIDS -PO BID PPI indefinitely -He will discuss alternate meds for arthritic pain with PCP.  -Stable for discharge from GI standpoint, I discussed with Hospitalist. Our office will call patient with outpatient follow up   2. ABL anemia. Baseline hgb ~ 13, down to 9.5 at which time he received a unit of blood.  Hgb has stabilized at 8.4.   2. GERD with partial stricture. Actually says he has no reflux  Symptoms at home.  -PPI therapy at discharge.        SUBJECTIVE   No further bleeding. Feels fine. Tolerated soft diet.     OBJECTIVE:     Vital signs in last 24 hours: Temp:  [98.6 F (37 C)-99.3 F (37.4 C)] 98.6 F (37 C) (05/27 2136) Pulse Rate:  [70-72] 72 (05/27 2136) Resp:  [18] 18 (05/27 1423) BP: (117-132)/(53-63) 117/63 (05/27 2136) SpO2:  [97 %-100 %] 100 % (05/27 2136) Weight:  [143.7 kg] 143.7 kg (05/28 0516) Last BM Date: 06/17/18 General:   Alert, male in NAD EENT:  Normal hearing, non icteric sclera, conjunctive pink.  Heart:  Regular rate and rhythm.  No lower extremity edema   Pulm: Normal respiratory effort Abdomen:  Soft, nondistended, nontender.  Normal bowel sounds,.       Neurologic:  Alert and  oriented x4;  grossly normal neurologically. Psych:  Pleasant, cooperative.  Normal mood and affect.   Intake/Output from previous day: 05/27 0701 - 05/28 0700 In: 1503.1 [I.V.:1503.1] Out: 450 [Urine:450] Intake/Output this shift: No intake/output data recorded.  Lab Results: Recent Labs    06/17/18 0251  06/17/18 1510 06/17/18 2041 06/18/18 0225  WBC 7.7  --   --   --  9.0  HGB 7.7*   < > 8.1* 8.1* 8.4*  HCT 24.4*   < > 25.8* 25.8* 26.2*  PLT 188  --   --   --  227   < > = values in this interval  not displayed.   BMET Recent Labs    06/17/18 0251 06/18/18 0225  NA 139 138  K 4.1 4.0  CL 111 109  CO2 22 23  GLUCOSE 100* 102*  BUN 22 13  CREATININE 0.97 1.23  CALCIUM 8.2* 8.5*     Principal Problem:   Upper GI bleed Active Problems:   AKI (acute kidney injury) (HCC)   Symptomatic anemia   Leukocytosis   QT prolongation   Acute duodenal ulcer with bleeding   Melena   Acute blood loss anemia     LOS: 2 days   Willette Cluster ,NP 06/18/2018, 9:29 AM  GI ATTENDING  Interval history data reviewed.  Agree with interval progress note as outlined above.  The patient is stable status post endoscopic hemostatic therapy for bleeding duodenal ulcer secondary to NSAIDs.  Testing for Helicobacter pylori was negative.  Agree with discharge home on twice daily PPI indefinitely and avoidance of NSAIDs.  He should look to his PCP regarding non-NSAID options for his chronic pain.  Outpatient GI follow-up will be arranged.  We will sign off.  Wilhemina Bonito. Eda Keys., M.D. [facility] Division of Gastroenterology.

## 2018-06-23 LAB — HIV ANTIBODY (ROUTINE TESTING W REFLEX): HIV Screen 4th Generation wRfx: NONREACTIVE

## 2018-12-27 ENCOUNTER — Emergency Department (HOSPITAL_COMMUNITY): Payer: Medicare Other

## 2018-12-27 ENCOUNTER — Other Ambulatory Visit: Payer: Self-pay

## 2018-12-27 ENCOUNTER — Encounter (HOSPITAL_COMMUNITY): Payer: Self-pay | Admitting: Emergency Medicine

## 2018-12-27 ENCOUNTER — Inpatient Hospital Stay (HOSPITAL_COMMUNITY)
Admission: EM | Admit: 2018-12-27 | Discharge: 2019-01-06 | DRG: 177 | Disposition: A | Discharge status: Home Health | Payer: Medicare Other | Attending: Family Medicine | Admitting: Family Medicine

## 2018-12-27 DIAGNOSIS — M109 Gout, unspecified: Secondary | ICD-10-CM | POA: Diagnosis present

## 2018-12-27 DIAGNOSIS — Z79899 Other long term (current) drug therapy: Secondary | ICD-10-CM

## 2018-12-27 DIAGNOSIS — K219 Gastro-esophageal reflux disease without esophagitis: Secondary | ICD-10-CM | POA: Diagnosis present

## 2018-12-27 DIAGNOSIS — R7401 Elevation of levels of liver transaminase levels: Secondary | ICD-10-CM | POA: Diagnosis present

## 2018-12-27 DIAGNOSIS — N1831 Chronic kidney disease, stage 3a: Secondary | ICD-10-CM | POA: Diagnosis present

## 2018-12-27 DIAGNOSIS — I509 Heart failure, unspecified: Secondary | ICD-10-CM

## 2018-12-27 DIAGNOSIS — E669 Obesity, unspecified: Secondary | ICD-10-CM | POA: Diagnosis present

## 2018-12-27 DIAGNOSIS — J9601 Acute respiratory failure with hypoxia: Secondary | ICD-10-CM | POA: Diagnosis not present

## 2018-12-27 DIAGNOSIS — R0902 Hypoxemia: Secondary | ICD-10-CM

## 2018-12-27 DIAGNOSIS — J9383 Other pneumothorax: Secondary | ICD-10-CM | POA: Diagnosis present

## 2018-12-27 DIAGNOSIS — I10 Essential (primary) hypertension: Secondary | ICD-10-CM | POA: Diagnosis not present

## 2018-12-27 DIAGNOSIS — J939 Pneumothorax, unspecified: Secondary | ICD-10-CM

## 2018-12-27 DIAGNOSIS — F329 Major depressive disorder, single episode, unspecified: Secondary | ICD-10-CM | POA: Diagnosis present

## 2018-12-27 DIAGNOSIS — Z8673 Personal history of transient ischemic attack (TIA), and cerebral infarction without residual deficits: Secondary | ICD-10-CM

## 2018-12-27 DIAGNOSIS — I5032 Chronic diastolic (congestive) heart failure: Secondary | ICD-10-CM | POA: Diagnosis present

## 2018-12-27 DIAGNOSIS — I5030 Unspecified diastolic (congestive) heart failure: Secondary | ICD-10-CM | POA: Diagnosis not present

## 2018-12-27 DIAGNOSIS — U071 COVID-19: Secondary | ICD-10-CM | POA: Diagnosis not present

## 2018-12-27 DIAGNOSIS — J1289 Other viral pneumonia: Secondary | ICD-10-CM | POA: Diagnosis not present

## 2018-12-27 DIAGNOSIS — Z8616 Personal history of COVID-19: Secondary | ICD-10-CM | POA: Diagnosis present

## 2018-12-27 DIAGNOSIS — Z6841 Body Mass Index (BMI) 40.0 and over, adult: Secondary | ICD-10-CM | POA: Diagnosis not present

## 2018-12-27 DIAGNOSIS — I130 Hypertensive heart and chronic kidney disease with heart failure and stage 1 through stage 4 chronic kidney disease, or unspecified chronic kidney disease: Secondary | ICD-10-CM | POA: Diagnosis present

## 2018-12-27 DIAGNOSIS — N183 Chronic kidney disease, stage 3 unspecified: Secondary | ICD-10-CM

## 2018-12-27 HISTORY — DX: COVID-19: U07.1

## 2018-12-27 HISTORY — DX: Acute respiratory failure with hypoxia: J96.01

## 2018-12-27 LAB — COMPREHENSIVE METABOLIC PANEL
ALT: 30 U/L (ref 0–44)
AST: 36 U/L (ref 15–41)
Albumin: 2.8 g/dL — ABNORMAL LOW (ref 3.5–5.0)
Alkaline Phosphatase: 77 U/L (ref 38–126)
Anion gap: 11 (ref 5–15)
BUN: 23 mg/dL (ref 8–23)
CO2: 24 mmol/L (ref 22–32)
Calcium: 8.1 mg/dL — ABNORMAL LOW (ref 8.9–10.3)
Chloride: 99 mmol/L (ref 98–111)
Creatinine, Ser: 1.4 mg/dL — ABNORMAL HIGH (ref 0.61–1.24)
GFR calc Af Amer: 59 mL/min — ABNORMAL LOW (ref >60)
GFR calc non Af Amer: 51 mL/min — ABNORMAL LOW (ref >60)
Glucose, Bld: 144 mg/dL — ABNORMAL HIGH (ref 70–99)
Potassium: 3.7 mmol/L (ref 3.5–5.1)
Sodium: 134 mmol/L — ABNORMAL LOW (ref 135–145)
Total Bilirubin: 0.8 mg/dL (ref 0.3–1.2)
Total Protein: 7 g/dL (ref 6.5–8.1)

## 2018-12-27 LAB — CBC WITH DIFFERENTIAL/PLATELET
Abs Immature Granulocytes: 0.06 10*3/uL (ref 0.00–0.07)
Basophils Absolute: 0 10*3/uL (ref 0.0–0.1)
Basophils Relative: 0 %
Eosinophils Absolute: 0 10*3/uL (ref 0.0–0.5)
Eosinophils Relative: 0 %
HCT: 42.9 % (ref 39.0–52.0)
Hemoglobin: 13.3 g/dL (ref 13.0–17.0)
Immature Granulocytes: 1 %
Lymphocytes Relative: 4 %
Lymphs Abs: 0.3 10*3/uL — ABNORMAL LOW (ref 0.7–4.0)
MCH: 26.8 pg (ref 26.0–34.0)
MCHC: 31 g/dL (ref 30.0–36.0)
MCV: 86.5 fL (ref 80.0–100.0)
Monocytes Absolute: 0.2 10*3/uL (ref 0.1–1.0)
Monocytes Relative: 3 %
Neutro Abs: 8.6 10*3/uL — ABNORMAL HIGH (ref 1.7–7.7)
Neutrophils Relative %: 92 %
Platelets: 266 10*3/uL (ref 150–400)
RBC: 4.96 MIL/uL (ref 4.22–5.81)
RDW: 16.7 % — ABNORMAL HIGH (ref 11.5–15.5)
WBC: 9.2 10*3/uL (ref 4.0–10.5)
nRBC: 0 % (ref 0.0–0.2)

## 2018-12-27 LAB — PROCALCITONIN: Procalcitonin: 0.17 ng/mL

## 2018-12-27 LAB — C-REACTIVE PROTEIN: CRP: 28 mg/dL — ABNORMAL HIGH (ref <1.0)

## 2018-12-27 LAB — POC SARS CORONAVIRUS 2 AG -  ED: SARS Coronavirus 2 Ag: POSITIVE — AB

## 2018-12-27 LAB — D-DIMER, QUANTITATIVE: D-Dimer, Quant: 0.87 ug/mL-FEU — ABNORMAL HIGH (ref 0.00–0.50)

## 2018-12-27 LAB — TROPONIN I (HIGH SENSITIVITY): Troponin I (High Sensitivity): 7 ng/L (ref <18)

## 2018-12-27 MED ORDER — AMLODIPINE BESYLATE 5 MG PO TABS
2.5000 mg | ORAL_TABLET | Freq: Every morning | ORAL | Status: DC
  Administered 2018-12-28 – 2018-12-31 (×4): 2.5 mg via ORAL
  Filled 2018-12-27 (×4): qty 1

## 2018-12-27 MED ORDER — ACETAMINOPHEN 325 MG PO TABS
650.0000 mg | ORAL_TABLET | Freq: Four times a day (QID) | ORAL | Status: DC | PRN
  Administered 2018-12-29 – 2019-01-05 (×4): 650 mg via ORAL
  Filled 2018-12-27 (×4): qty 2

## 2018-12-27 MED ORDER — ZINC SULFATE 220 (50 ZN) MG PO CAPS
220.0000 mg | ORAL_CAPSULE | Freq: Every day | ORAL | Status: DC
  Administered 2018-12-28 – 2019-01-06 (×10): 220 mg via ORAL
  Filled 2018-12-27 (×10): qty 1

## 2018-12-27 MED ORDER — SERTRALINE HCL 50 MG PO TABS
200.0000 mg | ORAL_TABLET | Freq: Every morning | ORAL | Status: DC
  Administered 2018-12-28 – 2019-01-06 (×10): 200 mg via ORAL
  Filled 2018-12-27 (×10): qty 4
  Filled 2018-12-27: qty 2

## 2018-12-27 MED ORDER — SODIUM CHLORIDE 0.9 % IV SOLN
INTRAVENOUS | Status: DC
  Administered 2018-12-27: 21:00:00 via INTRAVENOUS

## 2018-12-27 MED ORDER — VITAMIN C 500 MG PO TABS
500.0000 mg | ORAL_TABLET | Freq: Every day | ORAL | Status: DC
  Administered 2018-12-28 – 2019-01-06 (×10): 500 mg via ORAL
  Filled 2018-12-27 (×10): qty 1

## 2018-12-27 MED ORDER — DEXAMETHASONE SODIUM PHOSPHATE 10 MG/ML IJ SOLN
6.0000 mg | INTRAMUSCULAR | Status: DC
  Administered 2018-12-27: 6 mg via INTRAVENOUS
  Filled 2018-12-27: qty 1

## 2018-12-27 MED ORDER — SODIUM CHLORIDE 0.9 % IV SOLN
100.0000 mg | Freq: Every day | INTRAVENOUS | Status: AC
  Administered 2018-12-28 – 2018-12-31 (×4): 100 mg via INTRAVENOUS
  Filled 2018-12-27: qty 20
  Filled 2018-12-27: qty 100
  Filled 2018-12-27 (×3): qty 20

## 2018-12-27 MED ORDER — GUAIFENESIN-DM 100-10 MG/5ML PO SYRP: 10.0000 mL | ORAL_SOLUTION | ORAL | Status: DC | PRN

## 2018-12-27 MED ORDER — SODIUM CHLORIDE 0.9 % IV SOLN
200.0000 mg | Freq: Once | INTRAVENOUS | Status: AC
  Administered 2018-12-28: 200 mg via INTRAVENOUS
  Filled 2018-12-27: qty 40

## 2018-12-27 MED ORDER — ENOXAPARIN SODIUM 40 MG/0.4ML ~~LOC~~ SOLN
40.0000 mg | SUBCUTANEOUS | Status: DC
  Administered 2018-12-27: 23:00:00 40 mg via SUBCUTANEOUS
  Filled 2018-12-27: qty 0.4

## 2018-12-27 MED ORDER — HYDROCOD POLST-CPM POLST ER 10-8 MG/5ML PO SUER: 5.0000 mL | Freq: Two times a day (BID) | ORAL | Status: DC | PRN

## 2018-12-27 MED ORDER — PANTOPRAZOLE SODIUM 40 MG PO TBEC
40.0000 mg | DELAYED_RELEASE_TABLET | Freq: Two times a day (BID) | ORAL | Status: DC
  Administered 2018-12-27 – 2019-01-06 (×20): 40 mg via ORAL
  Filled 2018-12-27 (×20): qty 1

## 2018-12-27 MED ORDER — ALBUTEROL SULFATE HFA 108 (90 BASE) MCG/ACT IN AERS
2.0000 | INHALATION_SPRAY | RESPIRATORY_TRACT | Status: DC | PRN
  Administered 2019-01-02 – 2019-01-03 (×2): 2 via RESPIRATORY_TRACT
  Filled 2018-12-27: qty 6.7

## 2018-12-27 MED ORDER — ALLOPURINOL 100 MG PO TABS
100.0000 mg | ORAL_TABLET | Freq: Two times a day (BID) | ORAL | Status: DC
  Administered 2018-12-28 – 2019-01-06 (×20): 100 mg via ORAL
  Filled 2018-12-27 (×21): qty 1

## 2018-12-27 MED ORDER — SODIUM CHLORIDE 0.9 % IV SOLN: INTRAVENOUS | Status: DC

## 2018-12-27 NOTE — ED Provider Notes (Signed)
[facility] EMERGENCY DEPARTMENT Provider Note   CSN: [id number] Arrival date & time: 12/27/18  1919     History   Chief Complaint Chief Complaint  Patient presents with  . COVID+ / SOB    HPI Kurt [name] is a 68 y.o. male.     Patient with history of congestive heart failure diastolic presents with worsening shortness of breath productive cough and fatigue the past few days.  Patient was diagnosed with Covid during Thanksgiving and was doing fairly well and had worsening symptoms past 24 hours.  Patient denies blood clot history, recent surgeries, leg swelling.     Past Medical History:  Diagnosis Date  . Arthritis   . CHF (congestive heart failure) (HCC)    diastolic dysfunction  . Chronic back pain   . COVID-19   . Depression   . Diverticulitis   . GI bleed 05/2018  . Hypertension   . PTSD (post-traumatic stress disorder)    after  a Fall   . Renal insufficiency    Decrease in GFR- PCP removed all NSAIDS & aspirin   . TIA (transient ischemic attack) 2014   patient denies states was [place]    Patient Active Problem List   Diagnosis Date Noted  . QT prolongation 06/15/2018  . 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 GI bleed 04/29/2014  . TIA (transient ischemic attack) 07/08/2012  . Diverticulitis 07/08/2012  . Hypertension 07/08/2012    Past Surgical History:  Procedure Laterality Date  . BIOPSY  06/16/2018   Procedure: BIOPSY;  Surgeon: Irene Shipper, MD;  Location: [facility] ENDOSCOPY;  Service: Endoscopy;;  . ELBOW FRACTURE SURGERY     ORIF, Shoulder manipulation    . ESOPHAGOGASTRODUODENOSCOPY N/A 04/30/2014   Procedure: ESOPHAGOGASTRODUODENOSCOPY (EGD);  Surgeon: Carol Ada, MD;  Location: [facility] ENDOSCOPY;  Service: Endoscopy;  Laterality: N/A;  . ESOPHAGOGASTRODUODENOSCOPY (EGD) WITH PROPOFOL N/A 06/16/2018   Procedure: ESOPHAGOGASTRODUODENOSCOPY  (EGD) WITH PROPOFOL;  Surgeon: Irene Shipper, MD;  Location: [facility] ENDOSCOPY;  Service: Endoscopy;  Laterality: N/A;  . HEMOSTASIS CLIP PLACEMENT  06/16/2018   Procedure: HEMOSTASIS CLIP PLACEMENT;  Surgeon: Irene Shipper, MD;  Location: [facility] ENDOSCOPY;  Service: Endoscopy;;  . LUMBAR LAMINECTOMY/DECOMPRESSION MICRODISCECTOMY N/A 02/16/2015   Procedure: Thoracic ten - thoracic twelve laminectomy;  Surgeon: Earnie Larsson, MD;  Location: [facility] NEURO ORS;  Service: Neurosurgery;  Laterality: N/A;  . LUMBAR LAMINECTOMY/DECOMPRESSION MICRODISCECTOMY N/A 03/22/2016   Procedure: Lumbar One-Two, Lumbar Two-Three, Lumbar Three-Four Laminectomy and Foraminotomy;  Surgeon: Earnie Larsson, MD;  Location: [facility];  Service: Neurosurgery;  Laterality: N/A;  . NASAL SEPTUM SURGERY    . SCLEROTHERAPY  06/16/2018   Procedure: SCLEROTHERAPY;  Surgeon: Irene Shipper, MD;  Location: [facility] ENDOSCOPY;  Service: Endoscopy;;        Home Medications    Prior to Admission medications   Medication Sig Start Date End Date Taking? Authorizing Provider  acetaminophen (TYLENOL) 500 MG tablet Take 1,000 mg by mouth every 4 (four) hours as needed for headache (pain).   Yes [provider]  Acetaminophen-Codeine 300-30 MG tablet Take 1-2 tablets by mouth every 4 (four) hours as needed for pain (max 8 tabs/24 hours).  10/26/18  Yes [provider]  albuterol (VENTOLIN HFA) 108 (90 Base) MCG/ACT inhaler Inhale 2 puffs into the lungs every 4 (four) hours as needed for wheezing or shortness of breath.  12/27/18  Yes [provider]  allopurinol (ZYLOPRIM) 100 MG tablet Take 100 mg by mouth 2 (two) times a day. 05/16/18  Yes [provider]  amLODipine (NORVASC) 2.5 MG tablet Take 2.5 mg by mouth every morning.  05/08/18  Yes [provider]  amoxicillin-clavulanate (AUGMENTIN) 875-125 MG tablet Take 1 tablet by mouth 2 (two) times daily. 12/27/18  Yes [provider]  furosemide (LASIX) 80 MG tablet Take  40 mg by mouth 2 (two) times daily.   Yes [provider]  lisinopril (PRINIVIL,ZESTRIL) 20 MG tablet Take 40 mg by mouth every morning.  12/29/14  Yes [provider]  OVER THE COUNTER MEDICATION Take 1 tablet by mouth every morning. "Instaflex"   Yes [provider]  pantoprazole (PROTONIX) 40 MG tablet Take 1 tablet (40 mg total) by mouth 2 (two) times daily for 30 days. 06/18/18 03/22/19 Yes [place], Eric J, DO  predniSONE (DELTASONE) 20 MG tablet Take 10-40 mg by mouth See admin instructions. Take 2 tablets (40 mg) by mouth daily until symptoms improve, then reduce by 1/2 tablet every other day until gone. 12/27/18  Yes [provider]  sertraline (ZOLOFT) 100 MG tablet Take 1 tablet (100 mg total) by mouth daily. Patient taking differently: Take 200 mg by mouth every morning.  07/11/12  Yes Drema Dallas, MD    Family History No family history on file.  Social History Social History   Tobacco Use  . Smoking status: Never Smoker  . Smokeless tobacco: Never Used  Substance Use Topics  . Alcohol use: Not Currently    Comment: quit in 1989   . Drug use: No     Allergies   Patient has no known allergies.   Review of Systems Review of Systems  Constitutional: Positive for fatigue and fever. Negative for chills.  HENT: Negative for congestion.   Eyes: Negative for visual disturbance.  Respiratory: Positive for cough and shortness of breath.   Cardiovascular: Negative for chest pain.  Gastrointestinal: Negative for abdominal pain and vomiting.  Genitourinary: Negative for dysuria and flank pain.  Musculoskeletal: Negative for back pain, neck pain and neck stiffness.  Skin: Negative for rash.  Neurological: Negative for light-headedness and headaches.     Physical Exam Updated Vital Signs BP 119/69   Pulse 93   Temp 100.3 F (37.9 C) (Oral)   Resp 16   SpO2 94%   Physical Exam Vitals signs and nursing note reviewed.  Constitutional:       Appearance: He is well-developed.  HENT:     Head: Normocephalic and atraumatic.     Nose: Congestion present.  Eyes:     General:        Right eye: No discharge.        Left eye: No discharge.     Conjunctiva/sclera: Conjunctivae normal.  Neck:     Musculoskeletal: Normal range of motion and neck supple.     Trachea: No tracheal deviation.  Cardiovascular:     Rate and Rhythm: Normal rate and regular rhythm.  Pulmonary:     Effort: Pulmonary effort is normal.     Breath sounds: Rhonchi present.  Abdominal:     General: There is no distension.     Palpations: Abdomen is soft.     Tenderness: There is no abdominal tenderness. There is no guarding.  Skin:    General: Skin is warm.     Findings: No rash.  Neurological:     Mental Status: He is alert and  oriented to person, place, and time.  Psychiatric:        Mood and Affect: Mood normal.      ED Treatments / Results  Labs (all labs ordered are listed, but only abnormal results are displayed) Labs Reviewed  COMPREHENSIVE METABOLIC PANEL - Abnormal; Notable for the following components:      Result Value   Sodium 134 (*)    Glucose, Bld 144 (*)    Creatinine, Ser 1.40 (*)    Calcium 8.1 (*)    Albumin 2.8 (*)    GFR calc non Af Amer 51 (*)    GFR calc Af Amer 59 (*)    All other components within normal limits  CBC WITH DIFFERENTIAL/PLATELET - Abnormal; Notable for the following components:   RDW 16.7 (*)    Neutro Abs 8.6 (*)    Lymphs Abs 0.3 (*)    All other components within normal limits  C-REACTIVE PROTEIN - Abnormal; Notable for the following components:   CRP 28.0 (*)    All other components within normal limits  D-DIMER, QUANTITATIVE (NOT AT [facility]) - Abnormal; Notable for the following components:   D-Dimer, Quant 0.87 (*)    All other components within normal limits  PROCALCITONIN  POC SARS CORONAVIRUS 2 AG -  ED    EKG EKG Interpretation  Date/Time:  Sunday December 27 2018 19:47:20 EST  Ventricular Rate:  96 PR Interval:    QRS Duration: 103 QT Interval:  382 QTC Calculation: 483 R Axis:   92 Text Interpretation: Sinus rhythm Right axis deviation Low voltage, precordial leads Borderline prolonged QT interval Confirmed by Blane OharaZavitz, Adair Lauderback [phone]([id number]) on 12/27/2018 8:04:26 PM   Radiology Dg Chest Portable 1 View  Result Date: 12/27/2018 CLINICAL DATA:  Tested positive for COVID-19 last week, worsening shortness of breath, productive cough, fatigue, fever, hypoxemia, history hypertension EXAM: PORTABLE CHEST 1 VIEW COMPARISON:  Portable exam 2003 hours compared to 06/15/2018 FINDINGS: Upper normal heart size. Mediastinal contours and pulmonary vascularity normal. Mild LEFT perihilar infiltrate question pneumonia. Remaining lungs clear. No pleural effusion or pneumothorax. No acute osseous findings. IMPRESSION: LEFT perihilar infiltrate question pneumonia. Electronically Signed   By: Ulyses SouthwardMark  Boles M.D.   On: 12/27/2018 20:36    Procedures Procedures (including critical care time)  Medications Ordered in ED Medications  0.9 %  sodium chloride infusion ( Intravenous New Bag/Given 12/27/18 2120)     Initial Impression / Assessment and Plan / ED Course  I have reviewed the triage vital signs and the nursing notes.  Pertinent labs & imaging results that were available during my care of the patient were reviewed by me and considered in my medical decision making (see chart for details).       Patient presents with worsening shortness of breath and known Covid diagnosis.  On exam patient does have rales and mild tachypnea.  Patient improved with nasal cannula 3 L. Patient's blood work reviewed sodium 134, creatinine 1.4, inflammatory markers elevated as expected.  Patient vitals stable.  Discussed with hospitalist for admission.  Chest x-ray consistent with pneumonia.  Kurt [name] was evaluated in Emergency Department on 12/27/2018 for the symptoms described in the history of  present illness. He was evaluated in the context of the global COVID-19 pandemic, which necessitated consideration that the patient might be at risk for infection with the SARS-CoV-2 virus that causes COVID-19. Institutional protocols and algorithms that pertain to the evaluation of patients at risk for COVID-19 are in a state  of rapid change based on information released by regulatory bodies including the CDC and federal and state organizations. These policies and algorithms were followed during the patient's care in the ED.  The patients results and plan were reviewed and discussed.   Any x-rays performed were independently reviewed by myself.   Differential diagnosis were considered with the presenting HPI.  Medications  0.9 %  sodium chloride infusion ( Intravenous New Bag/Given 12/27/18 2120)    Vitals:   12/27/18 2030 12/27/18 2045 12/27/18 2100 12/27/18 2115  BP: 110/70 (!) 102/56 113/77 119/69  Pulse: 89 89 94 93  Resp: 15 12 20 16   Temp:      TempSrc:      SpO2: 97% 95% 94% 94%    Final diagnoses:  Pneumonia due to COVID-19 virus  Hypoxia    Admission/ observation were discussed with the admitting physician, patient and/or family and they are comfortable with the plan.    Final Clinical Impressions(s) / ED Diagnoses   Final diagnoses:  Pneumonia due to COVID-19 virus  Hypoxia    ED Discharge Orders    None       , MD 12/27/18 2143

## 2018-12-27 NOTE — ED Notes (Signed)
POC CoV-2 COVID test POSITIVE, reported to Dr. Reather Converse.

## 2018-12-27 NOTE — ED Triage Notes (Signed)
Patient tested positive for Covid 19 last week , reports worsening SOB with productive cough /fatigue and fever . O2 sat=77% room air at arrival.

## 2018-12-27 NOTE — H&P (Signed)
History and Physical    SHYAM DAWSON WUJ:[id number] DOB: [birth year] DOA: 12/27/2018  PCP: Kaleen Mask, MD Patient coming from: Home  Chief Complaint: Shortness of breath, COVID-19 positive  HPI: Kurt [name] is a 68 y.o. male with medical history significant of chronic diastolic congestive heart failure, hypertension, TIA, CKD 3 presenting with complaints of shortness of breath, cough, and fever.  Patient states he had a Covid test done the day before Thanksgiving and it came back positive.  He was feeling okay at that time but for the past 3 days has not been feeling well.  Reports feeling dyspneic and his oxygen saturation dropping to the 70s with minimal ambulation.  He is coughing.  He feels some substernal chest tightness when he takes deep breaths.  He was previously having nausea and diarrhea which have now resolved.  His appetite continues to be poor but he was able to tolerate p.o. intake yesterday.  ED Course: Rapid SARS-CoV-2 antigen test positive.  Oxygen saturation 77% on room air, improved with 4 L supplemental oxygen.  Temperature 100.3 F.  No leukocytosis.  Absolute lymphocyte count low.  Procalcitonin 0.17.  Creatinine 1.4, was 0.9-1.2 in May 2020.  LFTs normal.  Inflammatory markers elevated: CRP 28, D-dimer 1.87.  Chest x-ray showing left perihilar infiltrate concerning for pneumonia.  Review of Systems:  All systems reviewed and apart from history of presenting illness, are negative.  Past Medical History:  Diagnosis Date  . Arthritis   . CHF (congestive heart failure) (HCC)    diastolic dysfunction  . Chronic back pain   . COVID-19   . Depression   . Diverticulitis   . GI bleed 05/2018  . Hypertension   . PTSD (post-traumatic stress disorder)    after  a Fall   . Renal insufficiency    Decrease in GFR- PCP removed all NSAIDS & aspirin   . TIA (transient ischemic attack) 2014   patient denies states was [place]    Past Surgical History:  Procedure  Laterality Date  . BIOPSY  06/16/2018   Procedure: BIOPSY;  Surgeon: Hilarie Fredrickson, MD;  Location: [facility] ENDOSCOPY;  Service: Endoscopy;;  . ELBOW FRACTURE SURGERY     ORIF, Shoulder manipulation    . ESOPHAGOGASTRODUODENOSCOPY N/A 04/30/2014   Procedure: ESOPHAGOGASTRODUODENOSCOPY (EGD);  Surgeon: Jeani Hawking, MD;  Location: [facility] ENDOSCOPY;  Service: Endoscopy;  Laterality: N/A;  . ESOPHAGOGASTRODUODENOSCOPY (EGD) WITH PROPOFOL N/A 06/16/2018   Procedure: ESOPHAGOGASTRODUODENOSCOPY (EGD) WITH PROPOFOL;  Surgeon: Hilarie Fredrickson, MD;  Location: [facility] ENDOSCOPY;  Service: Endoscopy;  Laterality: N/A;  . HEMOSTASIS CLIP PLACEMENT  06/16/2018   Procedure: HEMOSTASIS CLIP PLACEMENT;  Surgeon: Hilarie Fredrickson, MD;  Location: [facility] ENDOSCOPY;  Service: Endoscopy;;  . LUMBAR LAMINECTOMY/DECOMPRESSION MICRODISCECTOMY N/A 02/16/2015   Procedure: Thoracic ten - thoracic twelve laminectomy;  Surgeon: Julio Sicks, MD;  Location: [facility] NEURO ORS;  Service: Neurosurgery;  Laterality: N/A;  . LUMBAR LAMINECTOMY/DECOMPRESSION MICRODISCECTOMY N/A 03/22/2016   Procedure: Lumbar One-Two, Lumbar Two-Three, Lumbar Three-Four Laminectomy and Foraminotomy;  Surgeon: Julio Sicks, MD;  Location: [facility] OR;  Service: Neurosurgery;  Laterality: N/A;  . NASAL SEPTUM SURGERY    . SCLEROTHERAPY  06/16/2018   Procedure: SCLEROTHERAPY;  Surgeon: Hilarie Fredrickson, MD;  Location: [facility] ENDOSCOPY;  Service: Endoscopy;;     reports that he has never smoked. He has never used smokeless tobacco. He reports previous alcohol use. He reports that he does not use drugs.  No Known Allergies  History reviewed. No  pertinent family history.  Prior to Admission medications   Medication Sig Start Date End Date Taking? Authorizing Provider  acetaminophen (TYLENOL) 500 MG tablet Take 1,000 mg by mouth every 4 (four) hours as needed for headache (pain).   Yes [provider]  Acetaminophen-Codeine 300-30 MG tablet Take 1-2 tablets by mouth every 4 (four) hours as  needed for pain (max 8 tabs/24 hours).  10/26/18  Yes [provider]  albuterol (VENTOLIN HFA) 108 (90 Base) MCG/ACT inhaler Inhale 2 puffs into the lungs every 4 (four) hours as needed for wheezing or shortness of breath.  12/27/18  Yes [provider]  allopurinol (ZYLOPRIM) 100 MG tablet Take 100 mg by mouth 2 (two) times a day. 05/16/18  Yes [provider]  amLODipine (NORVASC) 2.5 MG tablet Take 2.5 mg by mouth every morning.  05/08/18  Yes [provider]  amoxicillin-clavulanate (AUGMENTIN) 875-125 MG tablet Take 1 tablet by mouth 2 (two) times daily. 12/27/18  Yes [provider]  furosemide (LASIX) 80 MG tablet Take 40 mg by mouth 2 (two) times daily.   Yes [provider]  lisinopril (PRINIVIL,ZESTRIL) 20 MG tablet Take 40 mg by mouth every morning.  12/29/14  Yes [provider]  OVER THE COUNTER MEDICATION Take 1 tablet by mouth every morning. "Instaflex"   Yes [provider]  pantoprazole (PROTONIX) 40 MG tablet Take 1 tablet (40 mg total) by mouth 2 (two) times daily for 30 days. 06/18/18 03/22/19 Yes [place], Eric J, DO  predniSONE (DELTASONE) 20 MG tablet Take 10-40 mg by mouth See admin instructions. Take 2 tablets (40 mg) by mouth daily until symptoms improve, then reduce by 1/2 tablet every other day until gone. 12/27/18  Yes [provider]  sertraline (ZOLOFT) 100 MG tablet Take 1 tablet (100 mg total) by mouth daily. Patient taking differently: Take 200 mg by mouth every morning.  07/11/12  Yes Drema DallasWoods, Curtis J, MD    Physical Exam: Vitals:   12/27/18 2100 12/27/18 2115 12/27/18 2130 12/27/18 2200  BP: 113/77 119/69 103/66 121/61  Pulse: 94 93 86 88  Resp: 20 16 16 16   Temp:      TempSrc:      SpO2: 94% 94% 94% 93%    Physical Exam  Constitutional: He is oriented to person, place, and time. He appears well-developed and well-nourished. No distress.  HENT:  Head: Normocephalic.  Eyes: Right eye  exhibits no discharge. Left eye exhibits no discharge.  Neck: Neck supple.  Cardiovascular: Normal rate, regular rhythm and intact distal pulses.  Pulmonary/Chest: Effort normal. He has no wheezes. He has no rales.  On 4 L supplemental oxygen via nasal cannula  Abdominal: Soft. Bowel sounds are normal. He exhibits no distension. There is no abdominal tenderness. There is no guarding.  Musculoskeletal:        General: No edema.  Neurological: He is alert and oriented to person, place, and time.  Skin: Skin is warm and dry. He is not diaphoretic.     Labs on Admission: I have personally reviewed following labs and imaging studies  CBC: Recent Labs  Lab 12/27/18 2010  WBC 9.2  NEUTROABS 8.6*  HGB 13.3  HCT 42.9  MCV 86.5  PLT 266   Basic Metabolic Panel: Recent Labs  Lab 12/27/18 2010  NA 134*  K 3.7  CL 99  CO2 24  GLUCOSE 144*  BUN 23  CREATININE 1.40*  CALCIUM 8.1*   GFR: CrCl cannot be calculated (  Unknown ideal weight.). Liver Function Tests: Recent Labs  Lab 12/27/18 2010  AST 36  ALT 30  ALKPHOS 77  BILITOT 0.8  PROT 7.0  ALBUMIN 2.8*   No results for input(s): LIPASE, AMYLASE in the last 168 hours. No results for input(s): AMMONIA in the last 168 hours. Coagulation Profile: No results for input(s): INR, PROTIME in the last 168 hours. Cardiac Enzymes: No results for input(s): CKTOTAL, CKMB, CKMBINDEX, TROPONINI in the last 168 hours. BNP (last 3 results) No results for input(s): PROBNP in the last 8760 hours. HbA1C: No results for input(s): HGBA1C in the last 72 hours. CBG: No results for input(s): GLUCAP in the last 168 hours. Lipid Profile: No results for input(s): CHOL, HDL, LDLCALC, TRIG, CHOLHDL, LDLDIRECT in the last 72 hours. Thyroid Function Tests: No results for input(s): TSH, T4TOTAL, FREET4, T3FREE, THYROIDAB in the last 72 hours. Anemia Panel: No results for input(s): VITAMINB12, FOLATE, FERRITIN, TIBC, IRON, RETICCTPCT in the last  72 hours. Urine analysis:    Component Value Date/Time   COLORURINE YELLOW 06/15/2018 0350   APPEARANCEUR CLEAR 06/15/2018 0350   LABSPEC 1.014 06/15/2018 0350   PHURINE 5.0 06/15/2018 0350   GLUCOSEU NEGATIVE 06/15/2018 0350   HGBUR NEGATIVE 06/15/2018 0350   BILIRUBINUR NEGATIVE 06/15/2018 0350   KETONESUR NEGATIVE 06/15/2018 0350   PROTEINUR NEGATIVE 06/15/2018 0350   UROBILINOGEN 1.0 07/09/2012 0010   NITRITE NEGATIVE 06/15/2018 0350   LEUKOCYTESUR NEGATIVE 06/15/2018 0350    Radiological Exams on Admission: Dg Chest Portable 1 View  Result Date: 12/27/2018 CLINICAL DATA:  Tested positive for COVID-19 last week, worsening shortness of breath, productive cough, fatigue, fever, hypoxemia, history hypertension EXAM: PORTABLE CHEST 1 VIEW COMPARISON:  Portable exam 2003 hours compared to 06/15/2018 FINDINGS: Upper normal heart size. Mediastinal contours and pulmonary vascularity normal. Mild LEFT perihilar infiltrate question pneumonia. Remaining lungs clear. No pleural effusion or pneumothorax. No acute osseous findings. IMPRESSION: LEFT perihilar infiltrate question pneumonia. Electronically Signed   By: Lavonia Dana M.D.   On: 12/27/2018 20:36    EKG: Independently reviewed.  Sinus rhythm, QTc 483.  Assessment/Plan Principal Problem:   Pneumonia due to COVID-19 virus Active Problems:   Hypertension   Acute respiratory failure with hypoxia (HCC)   CKD (chronic kidney disease) stage 3, GFR 30-59 ml/min   CHF (congestive heart failure) (HCC)   Acute hypoxic respiratory failure secondary to COVID-19 viral pneumonia Rapid SARS-CoV-2 antigen test positive.  Oxygen saturation 77% on room air, improved with 4 L supplemental oxygen.  Temperature 100.3 F.  No leukocytosis.  Absolute lymphocyte count low. Inflammatory markers elevated: CRP 28, D-dimer 0.87.  Chest x-ray showing left perihilar infiltrate concerning for pneumonia.  Bacterial pneumonia less likely given procalcitonin 0.17.  -Remdesivir dosing per pharmacy -IV Decadron 6 mg daily -Vitamin C and zinc -Antitussives as needed -Tylenol as needed -Albuterol inhaler as needed -Daily CBC with differential, CMP, CRP, D-dimer, LDH -Airborne and contact precautions -Continuous pulse ox -Supplemental oxygen as needed to keep oxygen saturation above 90% -Blood culture x2 pending    CKD stage III Creatinine 1.4, was 0.9-1.2 in May 2020.  Mild creatinine elevation likely secondary to dehydration. -Encourage p.o. intake -Continue to monitor renal function  Mild chest discomfort Patient reports some substernal chest tightness when taking deep breaths.  EKG not suggestive of ACS.  Appears comfortable on exam. ?PE although age-adjusted D-dimer only mildly elevated.  Does have hypoxia likely from pneumonia but not tachycardic or tachypneic. -Cardiac monitoring -Trend troponin  Chronic diastolic  congestive heart failure -Stable.  No signs of volume overload at this time.  Hold Lasix at this time.  Hypertension -Continue home amlodipine  Gout -Continue allopurinol  GERD -Continue PPI  Depression -Continue Zoloft  DVT prophylaxis: Lovenox Code Status: Full code Family Communication: No family available. Disposition Plan: Anticipate discharge after clinical improvement. Consults called: None Admission status: It is my clinical opinion that admission to INPATIENT is reasonable and necessary in this 68 y.o. male . presenting with acute hypoxic respiratory failure secondary to COVID-19 viral pneumonia.  Has a new supplemental oxygen requirement.  High risk of decompensation.  Given the aforementioned, the predictability of an adverse outcome is felt to be significant. I expect that the patient will require at least 2 midnights in the hospital to treat this condition.   The medical decision making on this patient was of high complexity and the patient is at high risk for clinical deterioration, therefore this is a  level 3 visit.  John Giovanni MD [facility] Pager [phone]  If 7PM-7AM, please contact night-coverage [email or web address] Password [email or web address]  12/27/2018, 10:52 PM

## 2018-12-28 ENCOUNTER — Encounter (HOSPITAL_COMMUNITY): Payer: Self-pay

## 2018-12-28 DIAGNOSIS — N1831 Chronic kidney disease, stage 3a: Secondary | ICD-10-CM

## 2018-12-28 DIAGNOSIS — J9601 Acute respiratory failure with hypoxia: Secondary | ICD-10-CM

## 2018-12-28 DIAGNOSIS — I5030 Unspecified diastolic (congestive) heart failure: Secondary | ICD-10-CM

## 2018-12-28 DIAGNOSIS — I10 Essential (primary) hypertension: Secondary | ICD-10-CM

## 2018-12-28 LAB — CBC WITH DIFFERENTIAL/PLATELET
Abs Immature Granulocytes: 0.06 10*3/uL (ref 0.00–0.07)
Basophils Absolute: 0 10*3/uL (ref 0.0–0.1)
Basophils Relative: 0 %
Eosinophils Absolute: 0 10*3/uL (ref 0.0–0.5)
Eosinophils Relative: 0 %
HCT: 40.1 % (ref 39.0–52.0)
Hemoglobin: 12.5 g/dL — ABNORMAL LOW (ref 13.0–17.0)
Immature Granulocytes: 1 %
Lymphocytes Relative: 5 %
Lymphs Abs: 0.3 10*3/uL — ABNORMAL LOW (ref 0.7–4.0)
MCH: 26.7 pg (ref 26.0–34.0)
MCHC: 31.2 g/dL (ref 30.0–36.0)
MCV: 85.5 fL (ref 80.0–100.0)
Monocytes Absolute: 0.1 10*3/uL (ref 0.1–1.0)
Monocytes Relative: 2 %
Neutro Abs: 6.1 10*3/uL (ref 1.7–7.7)
Neutrophils Relative %: 92 %
Platelets: 247 10*3/uL (ref 150–400)
RBC: 4.69 MIL/uL (ref 4.22–5.81)
RDW: 16.7 % — ABNORMAL HIGH (ref 11.5–15.5)
WBC: 6.6 10*3/uL (ref 4.0–10.5)
nRBC: 0 % (ref 0.0–0.2)

## 2018-12-28 LAB — COMPREHENSIVE METABOLIC PANEL
ALT: 26 U/L (ref 0–44)
AST: 32 U/L (ref 15–41)
Albumin: 2.6 g/dL — ABNORMAL LOW (ref 3.5–5.0)
Alkaline Phosphatase: 69 U/L (ref 38–126)
Anion gap: 12 (ref 5–15)
BUN: 22 mg/dL (ref 8–23)
CO2: 28 mmol/L (ref 22–32)
Calcium: 8.3 mg/dL — ABNORMAL LOW (ref 8.9–10.3)
Chloride: 98 mmol/L (ref 98–111)
Creatinine, Ser: 1.28 mg/dL — ABNORMAL HIGH (ref 0.61–1.24)
GFR calc Af Amer: >60 mL/min (ref >60)
GFR calc non Af Amer: 56 mL/min — ABNORMAL LOW (ref >60)
Glucose, Bld: 169 mg/dL — ABNORMAL HIGH (ref 70–99)
Potassium: 4.1 mmol/L (ref 3.5–5.1)
Sodium: 138 mmol/L (ref 135–145)
Total Bilirubin: 0.4 mg/dL (ref 0.3–1.2)
Total Protein: 6.4 g/dL — ABNORMAL LOW (ref 6.5–8.1)

## 2018-12-28 LAB — HEPATITIS PANEL, ACUTE
HCV Ab: NONREACTIVE
Hep A IgM: NONREACTIVE
Hep B C IgM: NONREACTIVE
Hepatitis B Surface Ag: NONREACTIVE

## 2018-12-28 LAB — TROPONIN I (HIGH SENSITIVITY): Troponin I (High Sensitivity): 5 ng/L (ref <18)

## 2018-12-28 LAB — D-DIMER, QUANTITATIVE: D-Dimer, Quant: 0.78 ug/mL-FEU — ABNORMAL HIGH (ref 0.00–0.50)

## 2018-12-28 LAB — C-REACTIVE PROTEIN: CRP: 26.8 mg/dL — ABNORMAL HIGH (ref <1.0)

## 2018-12-28 LAB — LACTATE DEHYDROGENASE: LDH: 249 U/L — ABNORMAL HIGH (ref 98–192)

## 2018-12-28 MED ORDER — DEXAMETHASONE SODIUM PHOSPHATE 10 MG/ML IJ SOLN
6.0000 mg | Freq: Two times a day (BID) | INTRAMUSCULAR | Status: DC
  Administered 2018-12-28 – 2018-12-29 (×3): 6 mg via INTRAVENOUS
  Filled 2018-12-28 (×3): qty 1

## 2018-12-28 MED ORDER — FUROSEMIDE 20 MG PO TABS
40.0000 mg | ORAL_TABLET | Freq: Two times a day (BID) | ORAL | Status: DC
  Administered 2018-12-28 – 2018-12-30 (×4): 40 mg via ORAL
  Filled 2018-12-28 (×5): qty 2

## 2018-12-28 MED ORDER — HYDROCOD POLST-CPM POLST ER 10-8 MG/5ML PO SUER
5.0000 mL | Freq: Two times a day (BID) | ORAL | Status: DC
  Administered 2018-12-28 – 2019-01-06 (×16): 5 mL via ORAL
  Filled 2018-12-28 (×19): qty 5

## 2018-12-28 MED ORDER — ENOXAPARIN SODIUM 80 MG/0.8ML ~~LOC~~ SOLN
0.5000 mg/kg | SUBCUTANEOUS | Status: DC
  Administered 2018-12-28 – 2019-01-05 (×9): 65 mg via SUBCUTANEOUS
  Filled 2018-12-28 (×11): qty 0.8

## 2018-12-28 NOTE — ED Notes (Signed)
+  Tele [facility]  Breakfast ordered

## 2018-12-28 NOTE — Plan of Care (Signed)
  Problem: Education: Goal: Knowledge of risk factors and measures for prevention of condition will improve Outcome: Progressing   Problem: Coping: Goal: Psychosocial and spiritual needs will be supported Outcome: Progressing   Problem: Respiratory: Goal: Will maintain a patent airway Outcome: Progressing Goal: Complications related to the disease process, condition or treatment will be avoided or minimized Outcome: Progressing   

## 2018-12-28 NOTE — ED Notes (Signed)
ED TO INPATIENT HANDOFF REPORT  ED Nurse Name and Phone #:  [phone]  S Name/Age/Gender Kurt [name] 68 y.o. male Room/Bed: 019C/019C  Code Status   Code Status: Full Code  Home/SNF/Other Home Patient oriented to: self, place, time and situation Is this baseline? Yes   Triage Complete: Triage complete  Chief Complaint covid positive  SOB   Triage Note Patient tested positive for Covid 19 last week , reports worsening SOB with productive cough /fatigue and fever . O2 sat=77% room air at arrival.    Allergies No Known Allergies  Level of Care/Admitting Diagnosis ED Disposition    ED Disposition Condition [facility]: [place] [[id number]]  Level of Care: Telemetry [5]  Covid Evaluation: Confirmed COVID Positive  Diagnosis: Pneumonia due to COVID-19 virus [[id number]]  Admitting Physician: Shela Leff [[id number]]  Attending Physician: Shela Leff [[id number]]  Estimated length of stay: past midnight tomorrow  Certification:: I certify this patient will need inpatient services for at least 2 midnights  PT Class (Do Not Modify): Inpatient [101]  PT Acc Code (Do Not Modify): Private [1]       B Medical/Surgery History Past Medical History:  Diagnosis Date  . Arthritis   . CHF (congestive heart failure) (HCC)    diastolic dysfunction  . Chronic back pain   . COVID-19   . Depression   . Diverticulitis   . GI bleed 05/2018  . Hypertension   . PTSD (post-traumatic stress disorder)    after  a Fall   . Renal insufficiency    Decrease in GFR- PCP removed all NSAIDS & aspirin   . TIA (transient ischemic attack) 2014   patient denies states was [place]   Past Surgical History:  Procedure Laterality Date  . BIOPSY  06/16/2018   Procedure: BIOPSY;  Surgeon: Irene Shipper, MD;  Location: [facility] ENDOSCOPY;  Service: Endoscopy;;  . ELBOW FRACTURE SURGERY     ORIF, Shoulder manipulation    . ESOPHAGOGASTRODUODENOSCOPY N/A  04/30/2014   Procedure: ESOPHAGOGASTRODUODENOSCOPY (EGD);  Surgeon: Carol Ada, MD;  Location: [facility] ENDOSCOPY;  Service: Endoscopy;  Laterality: N/A;  . ESOPHAGOGASTRODUODENOSCOPY (EGD) WITH PROPOFOL N/A 06/16/2018   Procedure: ESOPHAGOGASTRODUODENOSCOPY (EGD) WITH PROPOFOL;  Surgeon: Irene Shipper, MD;  Location: [facility] ENDOSCOPY;  Service: Endoscopy;  Laterality: N/A;  . HEMOSTASIS CLIP PLACEMENT  06/16/2018   Procedure: HEMOSTASIS CLIP PLACEMENT;  Surgeon: Irene Shipper, MD;  Location: [facility] ENDOSCOPY;  Service: Endoscopy;;  . LUMBAR LAMINECTOMY/DECOMPRESSION MICRODISCECTOMY N/A 02/16/2015   Procedure: Thoracic ten - thoracic twelve laminectomy;  Surgeon: Earnie Larsson, MD;  Location: [place] NEURO ORS;  Service: Neurosurgery;  Laterality: N/A;  . LUMBAR LAMINECTOMY/DECOMPRESSION MICRODISCECTOMY N/A 03/22/2016   Procedure: Lumbar One-Two, Lumbar Two-Three, Lumbar Three-Four Laminectomy and Foraminotomy;  Surgeon: Earnie Larsson, MD;  Location: [place];  Service: Neurosurgery;  Laterality: N/A;  . NASAL SEPTUM SURGERY    . SCLEROTHERAPY  06/16/2018   Procedure: SCLEROTHERAPY;  Surgeon: Irene Shipper, MD;  Location: [facility] ENDOSCOPY;  Service: Endoscopy;;     A IV Location/Drains/Wounds Patient Lines/Drains/Airways Status   Active Line/Drains/Airways    Name:   Placement date:   Placement time:   Site:   Days:   Peripheral IV 12/27/18 Right Hand   12/27/18    2004    Hand   1   Incision (Closed) 02/16/15 Back   02/16/15    0941     1411   Incision (Closed) 03/22/16 Back Other (  Comment)   03/22/16    1414     1011          Intake/Output Last 24 hours No intake or output data in the 24 hours ending 12/28/18 0245  Labs/Imaging Results for orders placed or performed during the hospital encounter of 12/27/18 (from the past 48 hour(s))  Comprehensive metabolic panel     Status: Abnormal   Collection Time: 12/27/18  8:10 PM  Result Value Ref Range   Sodium 134 (L) 135 - 145 mmol/L   Potassium 3.7 3.5 - 5.1 mmol/L    Chloride 99 98 - 111 mmol/L   CO2 24 22 - 32 mmol/L   Glucose, Bld 144 (H) 70 - 99 mg/dL   BUN 23 8 - 23 mg/dL   Creatinine, Ser 1.40 (H) 0.61 - 1.24 mg/dL   Calcium 8.1 (L) 8.9 - 10.3 mg/dL   Total Protein 7.0 6.5 - 8.1 g/dL   Albumin 2.8 (L) 3.5 - 5.0 g/dL   AST 36 15 - 41 U/L   ALT 30 0 - 44 U/L   Alkaline Phosphatase 77 38 - 126 U/L   Total Bilirubin 0.8 0.3 - 1.2 mg/dL   GFR calc non Af Amer 51 (L) >60 mL/min   GFR calc Af Amer 59 (L) >60 mL/min   Anion gap 11 5 - 15    Comment: Performed at [facility], [address] [place], [place] [address]  CBC with Differential     Status: Abnormal   Collection Time: 12/27/18  8:10 PM  Result Value Ref Range   WBC 9.2 4.0 - 10.5 K/uL   RBC 4.96 4.22 - 5.81 MIL/uL   Hemoglobin 13.3 13.0 - 17.0 g/dL   HCT 42.9 39.0 - 52.0 %   MCV 86.5 80.0 - 100.0 fL   MCH 26.8 26.0 - 34.0 pg   MCHC 31.0 30.0 - 36.0 g/dL   RDW 16.7 (H) 11.5 - 15.5 %   Platelets 266 150 - 400 K/uL   nRBC 0.0 0.0 - 0.2 %   Neutrophils Relative % 92 %   Neutro Abs 8.6 (H) 1.7 - 7.7 K/uL   Lymphocytes Relative 4 %   Lymphs Abs 0.3 (L) 0.7 - 4.0 K/uL   Monocytes Relative 3 %   Monocytes Absolute 0.2 0.1 - 1.0 K/uL   Eosinophils Relative 0 %   Eosinophils Absolute 0.0 0.0 - 0.5 K/uL   Basophils Relative 0 %   Basophils Absolute 0.0 0.0 - 0.1 K/uL   Immature Granulocytes 1 %   Abs Immature Granulocytes 0.06 0.00 - 0.07 K/uL    Comment: Performed at [facility] [address], [place], [place] [address]  Procalcitonin     Status: None   Collection Time: 12/27/18  8:10 PM  Result Value Ref Range   Procalcitonin 0.17 ng/mL    Comment:        Interpretation: PCT (Procalcitonin) <= 0.5 ng/mL: Systemic infection (sepsis) is not likely. Local bacterial infection is possible. (NOTE)       Sepsis PCT Algorithm           Lower Respiratory Tract                                      Infection PCT Algorithm    ----------------------------      ----------------------------         PCT <  0.25 ng/mL                PCT < 0.10 ng/mL         Strongly encourage             Strongly discourage   discontinuation of antibiotics    initiation of antibiotics    ----------------------------     -----------------------------       PCT 0.25 - 0.50 ng/mL            PCT 0.10 - 0.25 ng/mL               OR       >80% decrease in PCT            Discourage initiation of                                            antibiotics      Encourage discontinuation           of antibiotics    ----------------------------     -----------------------------         PCT >= 0.50 ng/mL              PCT 0.26 - 0.50 ng/mL               AND        <80% decrease in PCT             Encourage initiation of                                             antibiotics       Encourage continuation           of antibiotics    ----------------------------     -----------------------------        PCT >= 0.50 ng/mL                  PCT > 0.50 ng/mL               AND         increase in PCT                  Strongly encourage                                      initiation of antibiotics    Strongly encourage escalation           of antibiotics                                     -----------------------------                                           PCT <= 0.25 ng/mL  OR                                        > 80% decrease in PCT                                     Discontinue / Do not initiate                                             antibiotics Performed at [facility], [place] [address], [place], [place] [address]   C-reactive protein     Status: Abnormal   Collection Time: 12/27/18  8:10 PM  Result Value Ref Range   CRP 28.0 (H) <1.0 mg/dL    Comment: Performed at [facility] [address] [place],  [address]  D-dimer, quantitative (not at [facility]     Status: Abnormal   Collection Time: 12/27/18   8:16 PM  Result Value Ref Range   D-Dimer, Quant 0.87 (H) 0.00 - 0.50 ug/mL-FEU    Comment: (NOTE) At the manufacturer cut-off of 0.50 ug/mL FEU, this assay has been documented to exclude PE with a sensitivity and negative predictive value of 97 to 99%.  At this time, this assay has not been approved by the FDA to exclude DVT/VTE. Results should be correlated with clinical presentation. Performed at [facility], [place] [address], [place], [place] [address]   POC SARS Coronavirus 2 Ag-ED - Nasal Swab (BD Veritor Kit)     Status: Abnormal   Collection Time: 12/27/18  9:45 PM  Result Value Ref Range   SARS Coronavirus 2 Ag POSITIVE (A) NEGATIVE    Comment: (NOTE) SARS-CoV-2 antigen PRESENT. Positive results indicate the presence of viral antigens, but clinical correlation with patient history and other diagnostic information is necessary to determine patient infection status.  Positive results do not rule out bacterial infection or co-infection  with other viruses. False positive results are rare but can occur, and confirmatory RT-PCR testing may be appropriate in some circumstances. The expected result is Negative. Fact Sheet for Patients: PodPark.tn Fact Sheet for Providers: GiftContent.is  This test is not yet approved or cleared by the [place] FDA and  has been authorized for detection and/or diagnosis of SARS-CoV-2 by FDA under an Emergency Use Authorization (EUA).  This EUA will remain in effect (meaning this test can be used) for the duration of  the COVID-19 declaration under Section 564(b)(1) of the Act, 21 U.S.C. section 360bbb-3(b)(1), unless the a uthorization is terminated or revoked sooner.   Troponin I (High Sensitivity)     Status: None   Collection Time: 12/27/18 11:06 PM  Result Value Ref Range   Troponin I (High Sensitivity) 7 <18 ng/L    Comment: (NOTE) Elevated high sensitivity troponin I  (hsTnI) values and significant  changes across serial measurements may suggest ACS but many other  chronic and acute conditions are known to elevate hsTnI results.  Refer to the "Links" section for chest pain algorithms and additional  guidance. Performed at [facility], [place] [address] [place], [place] [address]   Troponin I (High Sensitivity)     Status: None  Collection Time: 12/28/18 12:36 AM  Result Value Ref Range   Troponin I (High Sensitivity) 5 <18 ng/L    Comment: (NOTE) Elevated high sensitivity troponin I (hsTnI) values and significant  changes across serial measurements may suggest ACS but many other  chronic and acute conditions are known to elevate hsTnI results.  Refer to the "Links" section for chest pain algorithms and additional  guidance. Performed at [facility], [place] [address], [place], [place] [address]    Dg Chest Portable 1 View  Result Date: 12/27/2018 CLINICAL DATA:  Tested positive for COVID-19 last week, worsening shortness of breath, productive cough, fatigue, fever, hypoxemia, history hypertension EXAM: PORTABLE CHEST 1 VIEW COMPARISON:  Portable exam 2003 hours compared to 06/15/2018 FINDINGS: Upper normal heart size. Mediastinal contours and pulmonary vascularity normal. Mild LEFT perihilar infiltrate question pneumonia. Remaining lungs clear. No pleural effusion or pneumothorax. No acute osseous findings. IMPRESSION: LEFT perihilar infiltrate question pneumonia. Electronically Signed   By: Lavonia Dana M.D.   On: 12/27/2018 20:36    Pending Labs Unresulted Labs (From admission, onward)    Start     Ordered   12/28/18 0500  CBC with Differential/Platelet  Daily,   R     12/27/18 2235   12/28/18 0500  Comprehensive metabolic panel  Daily,   R     12/27/18 2235   12/28/18 0500  C-reactive protein  Daily,   R     12/27/18 2235   12/28/18 0500  D-dimer, quantitative (not at [facility])  Daily,   R     12/27/18 2235   12/28/18 0500  Lactate  dehydrogenase  Daily,   R     12/27/18 2235          Vitals/Pain Today's Vitals   12/27/18 2200 12/27/18 2230 12/27/18 2304 12/28/18 0000  BP: 121/61 133/84 (!) 146/76 123/62  Pulse: 88 90 87 87  Resp: 16 (!) 23 (!) 21 17  Temp:      TempSrc:      SpO2: 93% 96% 94% 96%  PainSc:        Isolation Precautions Airborne and Contact precautions  Medications Medications  allopurinol (ZYLOPRIM) tablet 100 mg (100 mg Oral Given 12/28/18 0033)  amLODipine (NORVASC) tablet 2.5 mg (has no administration in time range)  sertraline (ZOLOFT) tablet 200 mg (has no administration in time range)  pantoprazole (PROTONIX) EC tablet 40 mg (40 mg Oral Given 12/27/18 2259)  albuterol (VENTOLIN HFA) 108 (90 Base) MCG/ACT inhaler 2 puff (has no administration in time range)  enoxaparin (LOVENOX) injection 40 mg (40 mg Subcutaneous Given 12/27/18 2302)  remdesivir 200 mg in sodium chloride 0.9% 250 mL IVPB (0 mg Intravenous Stopped 12/28/18 0131)    Followed by  remdesivir 100 mg in sodium chloride 0.9 % 100 mL IVPB (has no administration in time range)  dexamethasone (DECADRON) injection 6 mg (6 mg Intravenous Given 12/27/18 2259)  guaiFENesin-dextromethorphan (ROBITUSSIN DM) 100-10 MG/5ML syrup 10 mL (has no administration in time range)  chlorpheniramine-HYDROcodone (TUSSIONEX) 10-8 MG/5ML suspension 5 mL (has no administration in time range)  vitamin C (ASCORBIC ACID) tablet 500 mg (has no administration in time range)  zinc sulfate capsule 220 mg (has no administration in time range)  acetaminophen (TYLENOL) tablet 650 mg (has no administration in time range)    Mobility walks with person assist Low fall risk   Focused Assessments Pulmonary Assessment Handoff:  Lung sounds:   O2 Device: (S) Nasal Cannula O2 Flow Rate (L/min): (  S) 4 L/min      R Recommendations: See Admitting Provider Note  Report given to:   Additional Notes: -

## 2018-12-28 NOTE — Progress Notes (Deleted)
Talked to pt son Heath Lark) regarding care of patient.

## 2018-12-28 NOTE — ED Notes (Signed)
Report given to Brock Ra, RN at [facility] requested for transport.

## 2018-12-28 NOTE — ED Notes (Signed)
Per request from admitting Dr. Marlowe Sax, RN called RT for respiratory assessment, to consider HFNC and ensure pt is safe for transport to [facility]. RT to see pt.   Alvera Novel RN updated r/t pts respiratory change. Day shift RN to update admitting

## 2018-12-28 NOTE — ED Notes (Signed)
Pt SpO2 decreased to 77 on 4 L oxygen while sleeping. Increased oxygen to 6 L SpO2 increased to 87-90%. Paged admditting.,

## 2018-12-28 NOTE — Progress Notes (Addendum)
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  He does have significant past medical history for diastolic heart failure, hypertension, TIAs, and chronic kidney disease stage III.  Patient was diagnosed with COVID-19 the day before Thanksgiving, November 25.  His symptoms were consistent with nausea, diarrhea, progressive cough and dyspnea.  His oxygen saturation dropped to 70% on minimal ambulation.  On his initial physical examination his oxygen saturation was 77%, his temperature was 100.3 F, blood pressure 113/77, heart rate 94, respiratory 20, oxygen saturation 93% on 4 L per nasal cannula.  His lungs had no wheezing or rails, heart S1-S2 present and rhythmic, abdomen was soft, no lower extremity edema. Sodium 134, potassium 3.7, chloride 99, bicarb 24, glucose 144, BUN 23, creatinine 1.40, white count 9.2, hemoglobin 13.3, hematocrit 42.9, platelets 266.  SARS COVID-19 positive.  Chest radiograph with left upper lobe interstitial infiltrate.  EKG with 96 bpm, normal axis, sinus rhythm, no ST segment T wave changes.  Patient was admitted to the hospital working diagnosis of acute hypoxic respiratory failure due to SARS COVID-19 viral pneumonia.   Assessment & Plan:   Principal Problem:   Pneumonia due to COVID-19 virus Active Problems:   Hypertension   Acute respiratory failure with hypoxia (HCC)   CKD (chronic kidney disease) stage 3, GFR 30-59 ml/min   CHF (congestive heart failure) (HCC)   1.  Acute hypoxic respiratory failure due to SARS COVID-19 viral pneumonia.  RR: 12  Pulse oxymetry:82% to 92%  Fi02: 6 LPM per [place]  COVID-19 Labs  Recent Labs    12/27/18 2010 12/27/18 2016 12/28/18 0403  DDIMER  --  0.87* 0.78*  LDH  --   --  249*  CRP 28.0*  --  26.8*    Lab Results  Component Value Date   SARSCOV2NAA NEGATIVE  06/15/2018    Continue medical therapy with Remdesivir #2/5, systemic corticosteroids with dexamethasone. On antitussive agents, bronchodilators and airway clearing techniques with flutter valve and incentive spirometer. Out of bed as tolerated. Vitamin C and zinc.   2. HTN. Continue blood pressure control with amlodipine. Will continue to hold on lisinopril and furosemide to prevent hypotension and renal failure.   3. Depression. Continue with sertraline.   4. Obesity. Will need outpatient follow up.   5. Diastolic heart failure. Stable with no signs of decompensation, continue blood pressure control with amlodipine, continue to hold on furosemide for now.  3. CKD stage 3a. Stable renal function with serum cr at 1,28 with K at 4,1 and serum bicarbonate at 28.   DVT prophylaxis: enoxaparin   Code Status:  full Family Communication: no family at the bedside  Disposition Plan/ discharge barriers: pending clinical improvement.   There is no height or weight on file to calculate BMI. Malnutrition Type:      Malnutrition Characteristics:      Nutrition Interventions:     RN Pressure Injury Documentation:     Consultants:     Procedures:     Antimicrobials:       Subjective: Patient reports improved dyspnea but not yet back to baseline, continue to have cough and generalized weakness, no nausea or vomiting, no chest pain or diarrhea.   Objective: Vitals:   12/28/18 0200 12/28/18 0327 12/28/18 0400 12/28/18 0600  BP: 137/77 132/88 (!) 142/80 (!) 150/88  Pulse: 85 95 84  80  Resp: 17 (!) 26 16 12   Temp:      TempSrc:      SpO2: 92% 95% 92% (!) 82%   No intake or output data in the 24 hours ending 12/28/18 0831 There were no vitals filed for this visit.  Examination:   General: deconditioned  Neurology: Awake and alert, non focal  E ENT: mild pallor, no icterus, oral mucosa moist Cardiovascular: No JVD. S1-S2 present, rhythmic, no gallops, rubs, or  murmurs. No lower extremity edema. Pulmonary: positive breath sounds bilaterally. Gastrointestinal. Abdomen protuberant with no organomegaly, non tender, no rebound or guarding Skin. No rashes Musculoskeletal: no joint deformities     Data Reviewed: I have personally reviewed following labs and imaging studies  CBC: Recent Labs  Lab 12/27/18 2010 12/28/18 0403  WBC 9.2 6.6  NEUTROABS 8.6* 6.1  HGB 13.3 12.5*  HCT 42.9 40.1  MCV 86.5 85.5  PLT 266 756   Basic Metabolic Panel: Recent Labs  Lab 12/27/18 2010 12/28/18 0403  NA 134* 138  K 3.7 4.1  CL 99 98  CO2 24 28  GLUCOSE 144* 169*  BUN 23 22  CREATININE 1.40* 1.28*  CALCIUM 8.1* 8.3*   GFR: CrCl cannot be calculated (Unknown ideal weight.). Liver Function Tests: Recent Labs  Lab 12/27/18 2010 12/28/18 0403  AST 36 32  ALT 30 26  ALKPHOS 77 69  BILITOT 0.8 0.4  PROT 7.0 6.4*  ALBUMIN 2.8* 2.6*   No results for input(s): LIPASE, AMYLASE in the last 168 hours. No results for input(s): AMMONIA in the last 168 hours. Coagulation Profile: No results for input(s): INR, PROTIME in the last 168 hours. Cardiac Enzymes: No results for input(s): CKTOTAL, CKMB, CKMBINDEX, TROPONINI in the last 168 hours. BNP (last 3 results) No results for input(s): PROBNP in the last 8760 hours. HbA1C: No results for input(s): HGBA1C in the last 72 hours. CBG: No results for input(s): GLUCAP in the last 168 hours. Lipid Profile: No results for input(s): CHOL, HDL, LDLCALC, TRIG, CHOLHDL, LDLDIRECT in the last 72 hours. Thyroid Function Tests: No results for input(s): TSH, T4TOTAL, FREET4, T3FREE, THYROIDAB in the last 72 hours. Anemia Panel: No results for input(s): VITAMINB12, FOLATE, FERRITIN, TIBC, IRON, RETICCTPCT in the last 72 hours.    Radiology Studies: I have reviewed all of the imaging during this hospital visit personally     Scheduled Meds: . allopurinol  100 mg Oral BID  . amLODipine  2.5 mg Oral q  morning - 10a  . dexamethasone (DECADRON) injection  6 mg Intravenous Q12H  . enoxaparin (LOVENOX) injection  40 mg Subcutaneous Q24H  . pantoprazole  40 mg Oral BID  . sertraline  200 mg Oral q morning - 10a  . vitamin C  500 mg Oral Daily  . zinc sulfate  220 mg Oral Daily   Continuous Infusions: . remdesivir 100 mg in NS 100 mL       LOS: 1 day        Mauricio Gerome Apley, MD

## 2018-12-28 NOTE — Progress Notes (Signed)
[id number] robby Sprung

## 2018-12-29 LAB — COMPREHENSIVE METABOLIC PANEL
ALT: 28 U/L (ref 0–44)
AST: 32 U/L (ref 15–41)
Albumin: 2.9 g/dL — ABNORMAL LOW (ref 3.5–5.0)
Alkaline Phosphatase: 65 U/L (ref 38–126)
Anion gap: 12 (ref 5–15)
BUN: 31 mg/dL — ABNORMAL HIGH (ref 8–23)
CO2: 30 mmol/L (ref 22–32)
Calcium: 8.6 mg/dL — ABNORMAL LOW (ref 8.9–10.3)
Chloride: 96 mmol/L — ABNORMAL LOW (ref 98–111)
Creatinine, Ser: 1.17 mg/dL (ref 0.61–1.24)
GFR calc Af Amer: >60 mL/min (ref >60)
GFR calc non Af Amer: >60 mL/min (ref >60)
Glucose, Bld: 125 mg/dL — ABNORMAL HIGH (ref 70–99)
Potassium: 4.2 mmol/L (ref 3.5–5.1)
Sodium: 138 mmol/L (ref 135–145)
Total Bilirubin: 0.9 mg/dL (ref 0.3–1.2)
Total Protein: 6.8 g/dL (ref 6.5–8.1)

## 2018-12-29 LAB — C-REACTIVE PROTEIN: CRP: 14.1 mg/dL — ABNORMAL HIGH (ref <1.0)

## 2018-12-29 LAB — FERRITIN: Ferritin: 141 ng/mL (ref 24–336)

## 2018-12-29 LAB — D-DIMER, QUANTITATIVE: D-Dimer, Quant: 0.63 ug/mL-FEU — ABNORMAL HIGH (ref 0.00–0.50)

## 2018-12-29 MED ORDER — DEXAMETHASONE SODIUM PHOSPHATE 10 MG/ML IJ SOLN
6.0000 mg | INTRAMUSCULAR | Status: DC
  Administered 2018-12-30 – 2018-12-31 (×2): 6 mg via INTRAVENOUS
  Filled 2018-12-29 (×2): qty 1

## 2018-12-29 NOTE — Progress Notes (Signed)
Kurt NOTE    [place]  OXB:[id number] DOB: 12-21-1950 DOA: 12/27/2018 PCP: Leonard Downing, Kurt    Brief Narrative:  68 year old male former [name], Kurt presented with dyspnea.  He does have significant past medical history for diastolic heart [name], Kurt [name], Kurt [name], Kurt chronic kidney disease stage III.  Patient was diagnosed with COVID-19 the day before [name], Kurt [name].  His symptoms were consistent with nausea, diarrhea, progressive cough Kurt dyspnea.  His oxygen saturation dropped to 70% on minimal ambulation.  On his initial physical examination his oxygen saturation was 77%, his temperature was 100.3 F, blood pressure 113/77, heart rate 94, respiratory 20, oxygen saturation 93% on 4 L per nasal cannula.  His lungs had no wheezing or rails, heart S1-S2 present Kurt rhythmic, abdomen was soft, no lower extremity edema. Sodium 134, potassium 3.7, chloride 99, bicarb 24, glucose 144, BUN 23, creatinine 1.40, white count 9.2, hemoglobin 13.3, hematocrit 42.9, platelets 266.  SARS COVID-19 positive.  Chest radiograph with left upper lobe interstitial infiltrate.  EKG with 96 bpm, normal axis, sinus rhythm, no ST segment T wave changes.  Patient was admitted to the hospital working diagnosis of acute hypoxic respiratory [name] due to SARS COVID-19 viral pneumonia.  Patient has been responding well to medical therapy with steroids Kurt dexamethasone, but continue to have dyspnea with minimal efforts.   Assessment & Plan:   Principal Problem:   Pneumonia due to COVID-19 virus Active Problems:   Kurt [name]   Acute respiratory [name] with hypoxia (HCC)   CKD (chronic kidney disease) stage 3, GFR 30-59 ml/min   CHF (congestive heart [name]) ([place])   1.  Acute hypoxic respiratory [name] due to SARS COVID-19 viral pneumonia.  RR: 17  Pulse oxymetry: 94%  Fi02: 3 LPM per [place].  COVID-19 Labs  Recent Labs    12/27/18 2010 12/27/18 2016 12/28/18 0403 12/29/18  0340  DDIMER  --  0.87* 0.78* 0.63*  FERRITIN  --   --   --  141  LDH  --   --  249*  --   CRP 28.0*  --  26.8* 14.1*    Lab Results  Component Value Date   SARSCOV2NAA NEGATIVE 05/[name]/2020    Inflammatory markers are trending down.   Tolerating well medical therapy with Remdesivir #3/5 ( AST 32 Kurt ALT 28), on systemic corticosteroids with dexamethasone 6 mg IV q 24 H. Continue with antitussive agents, bronchodilators Kurt airway clearing techniques with flutter valve Kurt incentive spirometer. Vitamin C Kurt zinc.   Out of bed to chair tid with meals, will consult physical Kurt occupational therapy.   2. HTN. On amlodipine for blood pressure control. Holding  lisinopril Kurt furosemide due to risk of hypotension.   3. Depression. On sertraline, no confusion or agitation.  4. Obesity. Calculated BMI is 40,0  5. Diastolic heart [name]. No clinical signs of decompensation. On amlodipine for blood pressure control. Currently on 50% of his regular furosemide dose to prevent hypotension or renal [name].   3. CKD stage 3a. Today with serum cr at 1,17 with K at 4,2 Kurt serum bicarbonate at 30. Stable renal function, will continue with lower dose of furosemide for now.    DVT prophylaxis: enoxaparin   Code Status:  full Family Communication: no family at the bedside  Disposition Plan/ discharge barriers: transfer to medical ward, dc telemetry.    Body mass index is 40.03 kg/m. Malnutrition Type:      Malnutrition Characteristics:  Nutrition Interventions:     RN Pressure Injury Documentation:     Consultants:     Procedures:     Antimicrobials:       Subjective: Patient is feeling better, dyspnea has been improving but not yet back to normal, continue to feel dyspnea with minimal efforts, no nausea or vomiting.   Objective: Vitals:   12/28/18 2113 12/29/18 0048 12/29/18 0526 12/29/18 0752  BP: 125/70 136/80 115/67 (!) 126/53  Pulse: 80  77 73   Resp: 16  17 17   Temp:  97.9 F (36.6 C) 97.6 F (36.4 C) (!) 97.5 F (36.4 C)  TempSrc:  Oral Oral Oral  SpO2: 94%  90% 94%  Weight:      Height:        Intake/Output Summary (Last 24 hours) at 12/29/2018 14/08/2018 Last data filed at 12/29/2018 0500 Gross per 24 hour  Intake 680 ml  Output 1350 ml  Net -670 ml   Filed Weights   12/28/18 0900  Weight: 126.6 kg    Examination:   General: Not in pain or dyspnea, deconditioned  Neurology: Awake Kurt alert, non focal  E ENT: no pallor, no icterus, oral mucosa moist Cardiovascular: No JVD. S1-S2 present. No lower extremity edema. Pulmonary: positive breath sounds bilaterally. Gastrointestinal. Abdomen with no organomegaly, non tender, no rebound or guarding Skin. No rashes Musculoskeletal: no joint deformities     Data Reviewed: I have personally reviewed following labs Kurt imaging studies  CBC: Recent Labs  Lab 12/27/18 2010 12/28/18 0403  WBC 9.2 6.6  NEUTROABS 8.6* 6.1  HGB 13.3 12.5*  HCT 42.9 40.1  MCV 86.5 85.5  PLT 266 247   Basic Metabolic Panel: Recent Labs  Lab 12/27/18 2010 12/28/18 0403 12/29/18 0340  NA 134* 138 138  K 3.7 4.1 4.2  CL 99 98 96*  CO2 24 28 30   GLUCOSE 144* 169* 125*  BUN 23 22 31*  CREATININE 1.40* 1.28* 1.17  CALCIUM 8.1* 8.3* 8.6*   GFR: Estimated Creatinine Clearance: 78.4 mL/min (by C-G formula based on SCr of 1.17 mg/dL). Liver Function Tests: Recent Labs  Lab 12/27/18 2010 12/28/18 0403 12/29/18 0340  AST 36 32 32  ALT 30 26 28   ALKPHOS 77 69 65  BILITOT 0.8 0.4 0.9  PROT 7.0 6.4* 6.8  ALBUMIN 2.8* 2.6* 2.9*   No results for input(s): LIPASE, AMYLASE in the last 168 hours. No results for input(s): AMMONIA in the last 168 hours. Coagulation Profile: No results for input(s): INR, PROTIME in the last 168 hours. Cardiac Enzymes: No results for input(s): CKTOTAL, CKMB, CKMBINDEX, TROPONINI in the last 168 hours. BNP (last 3 results) No results for input(s):  PROBNP in the last 8760 hours. HbA1C: No results for input(s): HGBA1C in the last 72 hours. CBG: No results for input(s): GLUCAP in the last 168 hours. Lipid Profile: No results for input(s): CHOL, HDL, LDLCALC, TRIG, CHOLHDL, LDLDIRECT in the last 72 hours. Thyroid Function Tests: No results for input(s): TSH, T4TOTAL, FREET4, T3FREE, THYROIDAB in the last 72 hours. Anemia Panel: No results for input(s): VITAMINB12, FOLATE, FERRITIN, TIBC, IRON, RETICCTPCT in the last 72 hours.    Radiology Studies: I have reviewed all of the imaging during this hospital visit personally     Scheduled Meds: . allopurinol  100 mg Oral BID  . amLODipine  2.5 mg Oral q morning - 10a  . chlorpheniramine-HYDROcodone  5 mL Oral Q12H  . dexamethasone (DECADRON) injection  6 mg Intravenous Q12H  .  enoxaparin (LOVENOX) injection  0.5 mg/kg Subcutaneous Q24H  . furosemide  40 mg Oral BID  . pantoprazole  40 mg Oral BID  . sertraline  200 mg Oral q morning - 10a  . vitamin C  500 mg Oral Daily  . zinc sulfate  220 mg Oral Daily   Continuous Infusions: . remdesivir 100 mg in NS 100 mL Stopped (12/28/18 1041)     LOS: 2 days        Mauricio Annett Gulaaniel Arrien, Kurt

## 2018-12-30 LAB — COMPREHENSIVE METABOLIC PANEL
ALT: 28 U/L (ref 0–44)
AST: 28 U/L (ref 15–41)
Albumin: 2.9 g/dL — ABNORMAL LOW (ref 3.5–5.0)
Alkaline Phosphatase: 62 U/L (ref 38–126)
Anion gap: 10 (ref 5–15)
BUN: 38 mg/dL — ABNORMAL HIGH (ref 8–23)
CO2: 30 mmol/L (ref 22–32)
Calcium: 8.4 mg/dL — ABNORMAL LOW (ref 8.9–10.3)
Chloride: 99 mmol/L (ref 98–111)
Creatinine, Ser: 1.25 mg/dL — ABNORMAL HIGH (ref 0.61–1.24)
GFR calc Af Amer: >60 mL/min (ref >60)
GFR calc non Af Amer: 58 mL/min — ABNORMAL LOW (ref >60)
Glucose, Bld: 112 mg/dL — ABNORMAL HIGH (ref 70–99)
Potassium: 3.7 mmol/L (ref 3.5–5.1)
Sodium: 139 mmol/L (ref 135–145)
Total Bilirubin: 0.7 mg/dL (ref 0.3–1.2)
Total Protein: 6.5 g/dL (ref 6.5–8.1)

## 2018-12-30 LAB — C-REACTIVE PROTEIN: CRP: 5.9 mg/dL — ABNORMAL HIGH (ref <1.0)

## 2018-12-30 LAB — D-DIMER, QUANTITATIVE: D-Dimer, Quant: 0.52 ug/mL-FEU — ABNORMAL HIGH (ref 0.00–0.50)

## 2018-12-30 LAB — FERRITIN: Ferritin: 108 ng/mL (ref 24–336)

## 2018-12-30 MED ORDER — FUROSEMIDE 10 MG/ML IJ SOLN
40.0000 mg | Freq: Two times a day (BID) | INTRAMUSCULAR | Status: AC
  Administered 2018-12-30 – 2018-12-31 (×2): 40 mg via INTRAVENOUS
  Filled 2018-12-30 (×2): qty 4

## 2018-12-30 NOTE — Progress Notes (Signed)
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  He does have significant past medical history for diastolic heart failure, hypertension, TIAs, and chronic kidney disease stage III.  Patient was diagnosed with COVID-19 the day before Thanksgiving, November 25.   Patient was noted to be hypoxic.  He was hospitalized for further management.  Chest x-ray showed left upper lobe  infiltrate.     Assessment & Plan:   Acute hypoxic respiratory failure due to SARS COVID-19 viral pneumonia.  COVID-19 Labs  Recent Labs    12/28/18 0403 12/29/18 0340 12/30/18 0440  DDIMER 0.78* 0.63* 0.52*  FERRITIN  --  141 108  LDH 249*  --   --   CRP 26.8* 14.1* 5.9*   Patient respiratory status seems to be stable.  He still requiring oxygen 2 to 3 L.  Still gets very dyspneic even with minimal exertion.  He remains on remdesivir and steroids.  Inflammatory markers are trending down.  CRP is down to 5.9.  D-dimer 0.52.  Ferritin 108.  Noted to be on oral diuretics.  Continue with incentive spirometry, mobilization.  Out of bed to chair.  Vitamin C and zinc.  PT and OT evaluation.  Essential hypertension  Continue amlodipine.  Lisinopril placed on hold.  Occasional high readings noted.  Continue to monitor.  Depression On sertraline, no confusion or agitation.  Obesity Estimated body mass index is 40.03 kg/m as calculated from the following:   Height as of this encounter: 5\' 10"  (1.778 m).   Weight as of this encounter: 126.6 kg.  Chronic diastolic heart failure Does not appear to be decompensated but we would like to keep him dry.  He is on oral Lasix at 40 mg twice a day.  Strict ins and outs.  Daily weights.    CKD stage 3a Seems to be stable.  Continue monitoring urine output.  Check labs daily.    DVT prophylaxis: enoxaparin  Code Status: Full  Code Family Communication:  Discussed with the patient.  He will notify his family members. Disposition Plan/ discharge barriers: Mobilize.  Out of bed to chair.   Consultants:   None  Procedures:   None  Antimicrobials:  Anti-infectives (From admission, onward)   Start     Dose/Rate Route Frequency Ordered Stop   12/28/18 1000  remdesivir 100 mg in sodium chloride 0.9 % 100 mL IVPB     100 mg 200 mL/hr over 30 Minutes Intravenous Daily 12/27/18 2235 01/01/19 0959   12/27/18 2300  remdesivir 200 mg in sodium chloride 0.9% 250 mL IVPB     200 mg 580 mL/hr over 30 Minutes Intravenous Once 12/27/18 2235 12/28/18 0131        Subjective: States that he is feeling short of breath even at rest.  Some orthopnea is present.  Occasional cough.  No chest pain.  Objective: Vitals:   12/30/18 0407 12/30/18 0739 12/30/18 1002 12/30/18 1535  BP: (!) 148/88 (!) 159/86 137/71 (!) 150/81  Pulse: 72 63  65  Resp: 18 20  16   Temp: 98.2 F (36.8 C) 98 F (36.7 C)  98.6 F (37 C)  TempSrc: Oral Oral  Oral  SpO2: 91% 90%  90%  Weight:      Height:        Intake/Output Summary (Last 24 hours) at 12/30/2018 1551 Last data filed  at 12/30/2018 1536 Gross per 24 hour  Intake 560 ml  Output 1550 ml  Net -990 ml   Filed Weights   12/28/18 0900  Weight: 126.6 kg    Examination:   General appearance: Awake alert.  In no distress Resp: Highly tachypneic at rest.  Quite tachypneic with minimal exertion.  No use of accessory muscles.  Coarse breath sound bilaterally with crackles at the bases. Cardio: S1-S2 is normal regular.  No S3-S4.  No rubs murmurs or bruit GI: Abdomen is soft.  Nontender nondistended.  Bowel sounds are present normal.  No masses organomegaly Extremities: No edema.  Moving all his extremities. Neurologic: Alert and oriented x3.  No focal neurological deficits.       Data Reviewed: I have personally reviewed following labs and imaging studies  CBC: Recent  Labs  Lab 12/27/18 2010 12/28/18 0403  WBC 9.2 6.6  NEUTROABS 8.6* 6.1  HGB 13.3 12.5*  HCT 42.9 40.1  MCV 86.5 85.5  PLT 266 247   Basic Metabolic Panel: Recent Labs  Lab 12/27/18 2010 12/28/18 0403 12/29/18 0340 12/30/18 0440  NA 134* 138 138 139  K 3.7 4.1 4.2 3.7  CL 99 98 96* 99  CO2 24 28 30 30   GLUCOSE 144* 169* 125* 112*  BUN 23 22 31* 38*  CREATININE 1.40* 1.28* 1.17 1.25*  CALCIUM 8.1* 8.3* 8.6* 8.4*   GFR: Estimated Creatinine Clearance: 73.4 mL/min (A) (by C-G formula based on SCr of 1.25 mg/dL (H)). Liver Function Tests: Recent Labs  Lab 12/27/18 2010 12/28/18 0403 12/29/18 0340 12/30/18 0440  AST 36 32 32 28  ALT 30 26 28 28   ALKPHOS 77 69 65 62  BILITOT 0.8 0.4 0.9 0.7  PROT 7.0 6.4* 6.8 6.5  ALBUMIN 2.8* 2.6* 2.9* 2.9*   Anemia Panel: Recent Labs    12/29/18 0340 12/30/18 0440  FERRITIN 141 108      Radiology Studies: I have reviewed all of the imaging during this hospital visit personally     Scheduled Meds: . allopurinol  100 mg Oral BID  . amLODipine  2.5 mg Oral q morning - 10a  . chlorpheniramine-HYDROcodone  5 mL Oral Q12H  . dexamethasone (DECADRON) injection  6 mg Intravenous Q24H  . enoxaparin (LOVENOX) injection  0.5 mg/kg Subcutaneous Q24H  . furosemide  40 mg Oral BID  . pantoprazole  40 mg Oral BID  . sertraline  200 mg Oral q morning - 10a  . vitamin C  500 mg Oral Daily  . zinc sulfate  220 mg Oral Daily   Continuous Infusions: . remdesivir 100 mg in NS 100 mL 100 mg (12/30/18 1029)     LOS: 3 days     14/09/20, MD  Pager: [email or web address]

## 2018-12-30 NOTE — Plan of Care (Signed)
  Problem: Education: Goal: Knowledge of risk factors and measures for prevention of condition will improve Outcome: Progressing   Problem: Respiratory: Goal: Will maintain a patent airway Outcome: Progressing Goal: Complications related to the disease process, condition or treatment will be avoided or minimized Outcome: Progressing   

## 2018-12-30 NOTE — Progress Notes (Signed)
Spoke with pt about calling and  updating family, said he already did, and there was no need for RN to call.

## 2018-12-31 DIAGNOSIS — R7401 Elevation of levels of liver transaminase levels: Secondary | ICD-10-CM

## 2018-12-31 LAB — C-REACTIVE PROTEIN: CRP: 8 mg/dL — ABNORMAL HIGH (ref <1.0)

## 2018-12-31 LAB — CBC
HCT: 41.4 % (ref 39.0–52.0)
Hemoglobin: 13 g/dL (ref 13.0–17.0)
MCH: 26.7 pg (ref 26.0–34.0)
MCHC: 31.4 g/dL (ref 30.0–36.0)
MCV: 85 fL (ref 80.0–100.0)
Platelets: 438 10*3/uL — ABNORMAL HIGH (ref 150–400)
RBC: 4.87 MIL/uL (ref 4.22–5.81)
RDW: 16.3 % — ABNORMAL HIGH (ref 11.5–15.5)
WBC: 10.5 10*3/uL (ref 4.0–10.5)
nRBC: 0 % (ref 0.0–0.2)

## 2018-12-31 LAB — COMPREHENSIVE METABOLIC PANEL
ALT: 49 U/L — ABNORMAL HIGH (ref 0–44)
AST: 50 U/L — ABNORMAL HIGH (ref 15–41)
Albumin: 2.8 g/dL — ABNORMAL LOW (ref 3.5–5.0)
Alkaline Phosphatase: 70 U/L (ref 38–126)
Anion gap: 13 (ref 5–15)
BUN: 36 mg/dL — ABNORMAL HIGH (ref 8–23)
CO2: 28 mmol/L (ref 22–32)
Calcium: 8.4 mg/dL — ABNORMAL LOW (ref 8.9–10.3)
Chloride: 97 mmol/L — ABNORMAL LOW (ref 98–111)
Creatinine, Ser: 1.25 mg/dL — ABNORMAL HIGH (ref 0.61–1.24)
GFR calc Af Amer: >60 mL/min (ref >60)
GFR calc non Af Amer: 58 mL/min — ABNORMAL LOW (ref >60)
Glucose, Bld: 90 mg/dL (ref 70–99)
Potassium: 5.1 mmol/L (ref 3.5–5.1)
Sodium: 138 mmol/L (ref 135–145)
Total Bilirubin: 0.9 mg/dL (ref 0.3–1.2)
Total Protein: 6.1 g/dL — ABNORMAL LOW (ref 6.5–8.1)

## 2018-12-31 LAB — FERRITIN: Ferritin: 113 ng/mL (ref 24–336)

## 2018-12-31 LAB — D-DIMER, QUANTITATIVE: D-Dimer, Quant: 0.6 ug/mL-FEU — ABNORMAL HIGH (ref 0.00–0.50)

## 2018-12-31 MED ORDER — AMLODIPINE BESYLATE 5 MG PO TABS
2.5000 mg | ORAL_TABLET | Freq: Once | ORAL | Status: AC
  Administered 2018-12-31: 2.5 mg via ORAL
  Filled 2018-12-31: qty 1

## 2018-12-31 MED ORDER — AMLODIPINE BESYLATE 5 MG PO TABS
5.0000 mg | ORAL_TABLET | Freq: Every morning | ORAL | Status: DC
  Administered 2019-01-01 – 2019-01-06 (×6): 5 mg via ORAL
  Filled 2018-12-31 (×6): qty 1

## 2018-12-31 MED ORDER — DEXAMETHASONE SODIUM PHOSPHATE 10 MG/ML IJ SOLN
6.0000 mg | Freq: Two times a day (BID) | INTRAMUSCULAR | Status: DC
  Administered 2018-12-31 – 2019-01-02 (×4): 6 mg via INTRAVENOUS
  Filled 2018-12-31 (×4): qty 1

## 2018-12-31 MED ORDER — HYDRALAZINE HCL 25 MG PO TABS: 25.0000 mg | ORAL_TABLET | Freq: Four times a day (QID) | ORAL | Status: DC | PRN

## 2018-12-31 NOTE — Evaluation (Signed)
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  He does have significant past medical history for diastolic heart failure, hypertension, TIAs, and chronic kidney disease stage III.  Patient was diagnosed with COVID-19 the day before Thanksgiving, November 25.   Patient was noted to be hypoxic.  He was hospitalized for further management.  Chest x-ray showed left upper lobe  infiltrate  Clinical Impression  The patient received in recliner, SPO2 on 2 L(finger) @ 92%. Ambulated on RA to BR and around room on RA, SPO2 -finger 83%. Placed on 2 L. Finger 86%, then placed probe on ear with 96% SPIO2. Will continue to assess need for supplemental O2 and progress mobility. Patient will need to be safe and  Mod independent at DC. Pt admitted with above diagnosis.  Pt currently with functional limitations due to the deficits listed below (see PT Problem List). Pt will benefit from skilled PT to increase their independence and safety with mobility to allow discharge to the venue listed below.       Follow Up Recommendations Home health PT    Equipment Recommendations  None recommended by PT    Recommendations for Other Services       Precautions / Restrictions Precautions Precautions: Fall Precaution Comments: desats on RA, pt. reports that he fell down his steps a few months ago-has neuropathy      Mobility  Bed Mobility               General bed mobility comments: oob  Transfers Overall transfer level: Modified independent   Transfers: Sit to/from Stand Sit to Stand: Modified independent (Device/Increase time)         General transfer comment: pushed from recliner, up ad lib to BR  Ambulation/Gait Ambulation/Gait assistance: Supervision Gait Distance (Feet): 20 Feet(then 45) Assistive device: None;Straight  cane Gait Pattern/deviations: Step-through pattern Gait velocity: decr   General Gait Details: has a lateral dip to the Rside, held to table and wall without SPC, then he used SPC and gait was steady but slow. On RA SPO2 83%(finger). back to 88%. Placed probe on ear with 96%(better than finger read) on L.Ear.  Stairs            Wheelchair Mobility    Modified Rankin (Stroke Patients Only)       Balance Overall balance assessment: History of Falls;Needs assistance   Sitting balance-Leahy Scale: Good     Standing balance support: No upper extremity supported Standing balance-Leahy Scale: Fair                               Pertinent Vitals/Pain Pain Assessment: Faces Faces Pain Scale: Hurts little more Pain Location: shoulders, general. suffered major trauma of UE and back from fall in past Pain Descriptors / Indicators: Aching;Discomfort Pain Intervention(s): Monitored during session    Home Living Family/patient expects to be discharged to:: Private residence Living Arrangements: Alone Available Help at Discharge: Family;Available PRN/intermittently Type of Home: House Home Access: Stairs to enter Entrance Stairs-Rails: Doctor, general practice of Steps: 7 Home Layout: One level Home Equipment: Walker - 2 wheels;Cane - single point;Shower seat      Prior Function Level of Independence: Independent with assistive device(s)         Comments: Education officer, 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 Travels a  lot.( previous encounter 6 mos ago)     Hand Dominance   Dominant Hand: Right    Extremity/Trunk Assessment   Upper Extremity Assessment Upper Extremity Assessment: Defer to OT evaluation    Lower Extremity Assessment Lower Extremity Assessment: Generalized weakness;RLE deficits/detail;LLE deficits/detail RLE Sensation: history of peripheral neuropathy LLE Sensation: history of peripheral neuropathy    Cervical /  Trunk Assessment Cervical / Trunk Assessment: Normal  Communication   Communication: No difficulties  Cognition Arousal/Alertness: Awake/alert Behavior During Therapy: WFL for tasks assessed/performed Overall Cognitive Status: Within Functional Limits for tasks assessed                                        General Comments      Exercises Other Exercises Other Exercises: encouraged flutter after ambulation to recover Other Exercises: provided Hands out but have not practiced except Pursed lip and abdominal breaths.   Assessment/Plan    PT Assessment Patient needs continued PT services  PT Problem List Decreased mobility;Decreased safety awareness;Decreased knowledge of precautions;Decreased activity tolerance;Cardiopulmonary status limiting activity;Impaired sensation;Decreased knowledge of use of DME;Decreased balance       PT Treatment Interventions DME instruction;Therapeutic activities;Gait training;Therapeutic exercise;Patient/family education;Stair training;Functional mobility training    PT Goals (Current goals can be found in the Care Plan section)  Acute Rehab PT Goals Patient Stated Goal: take care of myself PT Goal Formulation: With patient Time For Goal Achievement: 01/14/19 Potential to Achieve Goals: Good    Frequency Min 3X/week   Barriers to discharge Decreased caregiver support      Co-evaluation               AM-PAC PT "6 Clicks" Mobility  Outcome Measure Help needed turning from your back to your side while in a flat bed without using bedrails?: None Help needed moving from lying on your back to sitting on the side of a flat bed without using bedrails?: None Help needed moving to and from a bed to a chair (including a wheelchair)?: None Help needed standing up from a chair using your arms (e.g., wheelchair or bedside chair)?: None Help needed to walk in hospital room?: A Little Help needed climbing 3-5 steps with a railing? :  A Lot 6 Click Score: 21    End of Session Equipment Utilized During Treatment: Oxygen Activity Tolerance: Patient tolerated treatment well(somewhat limited  w/ dyspnea) Patient left: in chair;with call bell/phone within reach Nurse Communication: Mobility status PT Visit Diagnosis: History of falling (Z91.81)    Time: 0174-9449 PT Time Calculation (min) (ACUTE ONLY): 53 min   Charges:   PT Evaluation $PT Eval Moderate Complexity: 1 Mod PT Treatments $Gait Training: 23-37 mins $Self Care/Home Management: [place] Pager [phone] Office [phone]   Claretha Cooper 12/31/2018, 9:28 AM

## 2018-12-31 NOTE — Progress Notes (Signed)
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  He does have significant past medical history for diastolic heart failure, hypertension, TIAs, and chronic kidney disease stage III.  Patient was diagnosed with COVID-19 the day before Thanksgiving, November 25.   Patient was noted to be hypoxic.  He was hospitalized for further management.  Chest x-ray showed left upper lobe  infiltrate   Clinical Impression   PTA, pt lived alone independently and worked with an Careers information officer. Pt states he feels his "breathing is worse" and is asking why he  "is not getting better". Pt desats to low 80s on 2L with 3/4 DOE during ambulation and ADL activity. Pt educated how to use incentive spirometer appropriately. Completed incentive x 10 and flutter valve x 10. Encouraged pt to prone and ambulate with nsg staff. Pt verbalized understanding. Pt lives alone and has limited support at [place]. At this time recommend HHOT and [facility] Aide pending progress. Will follow acutely.     Follow Up Recommendations  Home health OT;Supervision - Intermittent    Equipment Recommendations  None recommended by OT    Recommendations for Other Services       Precautions / Restrictions Precautions Precautions: Fall Precaution Comments: desats on RA, pt. reports that he fell down his steps a few months ago-has neuropathy      Mobility Bed Mobility               General bed mobility comments: OOB in chair  Transfers Overall transfer level: Needs assistance   Transfers: Sit to/from Stand;Stand Pivot Transfers Sit to Stand: Min guard Stand pivot transfers: Min guard       General transfer comment: unsteady; LOB with turning but independently re4covered    Balance Overall balance assessment: History of Falls;Mild deficits observed, not  formally tested   Sitting balance-Leahy Scale: Good       Standing balance-Leahy Scale: Fair                             ADL either performed or assessed with clinical judgement   ADL Overall ADL's : Needs assistance/impaired     Grooming: Set up;Sitting   Upper Body Bathing: Set up;Sitting   Lower Body Bathing: Minimal assistance;Sit to/from stand   Upper Body Dressing : Set up;Sitting   Lower Body Dressing: Minimal assistance;Sit to/from stand   Toilet Transfer: Min guard;Ambulation   Toileting- Clothing Manipulation and Hygiene: Modified independent;Sit to/from stand       Functional mobility during ADLs: Min guard;Cueing for safety General ADL Comments: Pt unsteady without use of AD; feel he would benefit from use of RW; Staes he "falls over when standing if he closes his eyes"     Vision         Perception     Praxis      Pertinent Vitals/Pain Pain Assessment: Faces Faces Pain Scale: Hurts little more Pain Location: shoulders, general. 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; manages independentlyq   Lower Extremity Assessment Lower  Extremity Assessment: Defer to PT evaluation RLE Sensation: history of peripheral neuropathy LLE Sensation: history of peripheral neuropathy   Cervical / Trunk Assessment Cervical / Trunk Assessment: Other exceptions(hx of back surgery)   Communication Communication Communication: No difficulties   Cognition Arousal/Alertness: Awake/alert Behavior During Therapy: Anxious Overall Cognitive Status: Within Functional Limits for  tasks assessed                                     General Comments       Exercises Exercises: Other exercises Other Exercises Other Exercises: flutter valve x 10 Other Exercises:  incentive spirometer x 10 Other Exercises: encouraged theraband ex   Shoulder Instructions      Home Living Family/patient expects to be discharged to:: Private residence Living Arrangements: Alone Available Help at Discharge: Family;Available PRN/intermittently Type of Home: House Home Access: Stairs to enter [employer] of Steps: 7 Entrance Stairs-Rails: Right;Left Home Layout: One level     Bathroom Shower/Tub: Teacher, early years/pre: Standard Bathroom Accessibility: Yes How Accessible: Accessible via walker Home Equipment: [place] - 2 wheels;Cane - single point;Shower seat          Prior Functioning/Environment Level of Independence: Independent with assistive device(s)(occasionally uses a cane; rides motorcycles)        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 Travels a lot. previous encounter 6 mos ago        OT Problem List: Decreased strength;Decreased activity tolerance;Impaired balance (sitting and/or standing);Decreased knowledge of use of DME or AE;Cardiopulmonary status limiting activity;Obesity;Impaired UE functional use      OT Treatment/Interventions: Self-care/ADL training;Therapeutic exercise;Energy conservation;DME and/or AE instruction;Therapeutic activities;Patient/family education;Balance training    OT Goals(Current goals can be found in the care plan section) Acute Rehab OT Goals Patient Stated Goal: to get better OT Goal Formulation: With patient Time For Goal Achievement: 01/14/19 Potential to Achieve Goals: Good  OT Frequency: Min 3X/week   Barriers to D/C: Decreased caregiver support          Co-evaluation              AM-PAC OT "6 Clicks" Daily Activity     Outcome Measure  Help from another person eating meals?: None Help from another person taking care of personal grooming?: A Little Help from another person toileting, which includes using toliet, bedpan, or urinal?: A Little Help from another person bathing (including washing, rinsing, drying)?: A Little Help from another person to put on and taking off regular upper body clothing?: A Little Help from another person to put on and taking off regular lower body clothing?: A Little 6 Click Score: 19   End of Session Equipment Utilized During Treatment: Oxygen(2L) Nurse Communication: Mobility status  Activity Tolerance: Patient tolerated treatment well Patient left: in chair;with call bell/phone within reach  OT Visit Diagnosis: Unsteadiness on feet (R26.81);Muscle weakness (generalized) (M62.81)                Time: 8938-1017 OT Time Calculation (min): 36 min Charges:  OT General Charges $OT Visit: 1 Visit OT Evaluation $OT Eval Moderate Complexity: 1 Mod OT Treatments $Self Care/Home Management : 8-22 mins  Maurie Boettcher, OT/L   Acute OT Clinical Specialist Acute Rehabilitation Services Pager [phone] Office [phone]   [facility] 12/31/2018, 4:15 PM

## 2018-12-31 NOTE — Progress Notes (Signed)
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  He does have significant past medical history for diastolic heart failure, hypertension, TIAs, and chronic kidney disease stage III.  Patient was diagnosed with COVID-19 the day before Thanksgiving, November 25.   Patient was noted to be hypoxic.  He was hospitalized for further management.  Chest x-ray showed left upper lobe  infiltrate.     Assessment & Plan:   Acute hypoxic respiratory failure due to SARS COVID-19 viral pneumonia.  [place]  Lab 12/27/18 2010 12/27/18 2016 12/28/18 0403 12/29/18 0340 12/30/18 0440 12/31/18 0520  DDIMER  --  0.87* 0.78* 0.63* 0.52* 0.60*  FERRITIN  --   --   --  141 108 113  CRP 28.0*  --  26.8* 14.1* 5.9* 8.0*  ALT 30  --  26 28 28  49*  PROCALCITON 0.17  --   --   --   --   --     Objective findings: Fever: Afebrile Oxygen requirements: Nasal cannula.  2 L/min.  Saturating in the early 90s.  COVID 19 Therapeutics: Antibacterials: None Remdesivir: Will complete course of remdesivir today. Steroids: Dexamethasone 6 mg daily.  Will increase the dose to twice daily today due to rise in CRP. Diuretics: Getting furosemide intravenously Actemra: Not given yet Convalescent Plasma: Not given yet Vitamin C and Zinc: Continue PUD Prophylaxis: Protonix DVT Prophylaxis:  Lovenox 65 mg daily  Patient's respiratory status remained stable.  He still feels quite dyspneic when he exerts himself.  He is requiring only 2 to 3 L of oxygen.  He seems to be benefiting from furosemide which will be continued.  Inflammatory markers had been improving.  An increase in CRP is noted noted, 8.0 today.  D-dimer 0.6.  Ferritin is normal.  Continue incentive spirometry and mobilization.  Will most likely end up needing home oxygen.  Continue current treatment  for now.  Chest x-ray tomorrow.  Essential hypertension  Monitor blood pressures closely.  Continue amlodipine.  Occasional high readings noted.  Lisinopril is on hold.  Increase amlodipine dose.    Depression On sertraline, no confusion or agitation.  Obesity Estimated body mass index is 40.03 kg/m as calculated from the following:   Height as of this encounter: 5\' 10"  (1.778 m).   Weight as of this encounter: 126.6 kg.  Chronic diastolic heart failure Does not appear to be decompensated but we would like to keep him dry.  Is getting Lasix on a daily basis..    CKD stage 3a Seems to be stable.  Continue monitoring urine output.  Check labs daily.    Mild transaminitis Most likely due to COVID-19.  Outpatient follow-up.  DVT prophylaxis: enoxaparin  Code Status: Full Code Family Communication:  Discussed with the patient.  He will notify his family members. Disposition Plan/ discharge barriers: PT and OT working with him.  Home health is recommended.   Consultants:   None  Procedures:   None  Antimicrobials:  Anti-infectives (From admission, onward)   Start     Dose/Rate Route Frequency Ordered Stop   12/28/18 1000  remdesivir 100 mg in sodium chloride 0.9 % 100 mL IVPB     100 mg 200 mL/hr over 30 Minutes Intravenous Daily 12/27/18 2235 12/31/18 0856   12/27/18 2300  remdesivir 200 mg in sodium chloride 0.9% 250  mL IVPB     200 mg 580 mL/hr over 30 Minutes Intravenous Once 12/27/18 2235 12/28/18 0131        Subjective: States that he is about the same as yesterday.  Still gets short of breath with minimal exertion.  No chest pain.  Objective: Vitals:   12/30/18 1535 12/30/18 2022 12/31/18 0512 12/31/18 0749  BP: (!) 150/81 (!) 146/81 (!) 163/102 (!) 165/80  Pulse: 65 74 70 69  Resp: 16 18 15 20   Temp: 98.6 F (37 C) 99.3 F (37.4 C) 97.9 F (36.6 C) 98.3 F (36.8 C)  TempSrc: Oral Oral Oral Oral  SpO2: 90% 93% 91% 90%  Weight:      Height:         Intake/Output Summary (Last 24 hours) at 12/31/2018 1323 Last data filed at 12/31/2018 1036 Gross per 24 hour  Intake 1000 ml  Output 3900 ml  Net -2900 ml   Filed Weights   12/28/18 0900  Weight: 126.6 kg    Examination:   General appearance: Awake alert.  In no distress Resp: Mildly tachypneic at rest.  Coarse breath sound bilaterally.  Few crackles at the bases.  No wheezing or rhonchi. Cardio: S1-S2 is normal regular.  No S3-S4.  No rubs murmurs or bruit GI: Abdomen is soft.  Nontender nondistended.  Bowel sounds are present normal.  No masses organomegaly Extremities: No edema.  Moving all his extremities Neurologic: Alert and oriented x3.  No focal neurological deficits.     Data Reviewed: I have personally reviewed following labs and imaging studies  CBC: Recent Labs  Lab 12/27/18 2010 12/28/18 0403 12/31/18 0520  WBC 9.2 6.6 10.5  NEUTROABS 8.6* 6.1  --   HGB 13.3 12.5* 13.0  HCT 42.9 40.1 41.4  MCV 86.5 85.5 85.0  PLT 266 247 438*   Basic Metabolic Panel: Recent Labs  Lab 12/27/18 2010 12/28/18 0403 12/29/18 0340 12/30/18 0440 12/31/18 0520  NA 134* 138 138 139 138  K 3.7 4.1 4.2 3.7 5.1  CL 99 98 96* 99 97*  CO2 24 28 30 30 28   GLUCOSE 144* 169* 125* 112* 90  BUN 23 22 31* 38* 36*  CREATININE 1.40* 1.28* 1.17 1.25* 1.25*  CALCIUM 8.1* 8.3* 8.6* 8.4* 8.4*   GFR: Estimated Creatinine Clearance: 73.4 mL/min (A) (by C-G formula based on SCr of 1.25 mg/dL (H)). Liver Function Tests: Recent Labs  Lab 12/27/18 2010 12/28/18 0403 12/29/18 0340 12/30/18 0440 12/31/18 0520  AST 36 32 32 28 50*  ALT 30 26 28 28  49*  ALKPHOS 77 69 65 62 70  BILITOT 0.8 0.4 0.9 0.7 0.9  PROT 7.0 6.4* 6.8 6.5 6.1*  ALBUMIN 2.8* 2.6* 2.9* 2.9* 2.8*   Anemia Panel: Recent Labs    12/30/18 0440 12/31/18 0520  FERRITIN 108 113      Radiology Studies: DG Chest Portable 1 View  Result Date: 12/27/2018 CLINICAL DATA:  Tested positive for COVID-19 last  week, worsening shortness of breath, productive cough, fatigue, fever, hypoxemia, history hypertension EXAM: PORTABLE CHEST 1 VIEW COMPARISON:  Portable exam 2003 hours compared to 06/15/2018 FINDINGS: Upper normal heart size. Mediastinal contours and pulmonary vascularity normal. Mild LEFT perihilar infiltrate question pneumonia. Remaining lungs clear. No pleural effusion or pneumothorax. No acute osseous findings. IMPRESSION: LEFT perihilar infiltrate question pneumonia. Electronically Signed   By: Ulyses SouthwardMark  Boles M.D.   On: 12/27/2018 20:36        Scheduled Meds: . allopurinol  100 mg Oral  BID  . amLODipine  2.5 mg Oral q morning - 10a  . chlorpheniramine-HYDROcodone  5 mL Oral Q12H  . dexamethasone (DECADRON) injection  6 mg Intravenous Q24H  . enoxaparin (LOVENOX) injection  0.5 mg/kg Subcutaneous Q24H  . pantoprazole  40 mg Oral BID  . sertraline  200 mg Oral q morning - 10a  . vitamin C  500 mg Oral Daily  . zinc sulfate  220 mg Oral Daily   Continuous Infusions:    LOS: 4 days     Osvaldo Shipper, MD  Pager: [email or web address]

## 2019-01-01 ENCOUNTER — Inpatient Hospital Stay (HOSPITAL_COMMUNITY): Payer: Medicare Other

## 2019-01-01 DIAGNOSIS — J939 Pneumothorax, unspecified: Secondary | ICD-10-CM

## 2019-01-01 LAB — COMPREHENSIVE METABOLIC PANEL
ALT: 47 U/L — ABNORMAL HIGH (ref 0–44)
AST: 30 U/L (ref 15–41)
Albumin: 3 g/dL — ABNORMAL LOW (ref 3.5–5.0)
Alkaline Phosphatase: 68 U/L (ref 38–126)
Anion gap: 14 (ref 5–15)
BUN: 37 mg/dL — ABNORMAL HIGH (ref 8–23)
CO2: 28 mmol/L (ref 22–32)
Calcium: 8.4 mg/dL — ABNORMAL LOW (ref 8.9–10.3)
Chloride: 96 mmol/L — ABNORMAL LOW (ref 98–111)
Creatinine, Ser: 1.2 mg/dL (ref 0.61–1.24)
GFR calc Af Amer: >60 mL/min (ref >60)
GFR calc non Af Amer: >60 mL/min (ref >60)
Glucose, Bld: 133 mg/dL — ABNORMAL HIGH (ref 70–99)
Potassium: 4.1 mmol/L (ref 3.5–5.1)
Sodium: 138 mmol/L (ref 135–145)
Total Bilirubin: 0.9 mg/dL (ref 0.3–1.2)
Total Protein: 6.9 g/dL (ref 6.5–8.1)

## 2019-01-01 LAB — C-REACTIVE PROTEIN: CRP: 8 mg/dL — ABNORMAL HIGH (ref <1.0)

## 2019-01-01 MED ORDER — FUROSEMIDE 10 MG/ML IJ SOLN
40.0000 mg | Freq: Once | INTRAMUSCULAR | Status: AC
  Administered 2019-01-01: 40 mg via INTRAVENOUS
  Filled 2019-01-01: qty 4

## 2019-01-01 NOTE — Progress Notes (Signed)
radiology called and stated pt appears to have a new pneumothorax on left side, reccomends PA and lateral chest xray. Notified MD [place] Pink at [phone]

## 2019-01-01 NOTE — Care Management Important Message (Signed)
Important Message  Patient Details  Name: Kurt [name] MRN: [id number] Date of Birth: 05/20/1950   Medicare Important Message Given:  Yes - Important Message mailed due to current National Emergency  Verbal consent obtained due to current National Emergency  Relationship to patient: Child Contact Name: Krishay Faro Call Date: 01/01/19  Time: 1507 Phone: [phone] Outcome: Spoke with contact Important Message mailed to: Patient address on file    [place] 01/01/2019, 3:08 PM

## 2019-01-01 NOTE — Progress Notes (Signed)
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  He does have significant past medical history for diastolic heart failure, hypertension, TIAs, and chronic kidney disease stage III.  Patient was diagnosed with COVID-19 the day before Thanksgiving, November 25.   Patient was noted to be hypoxic.  He was hospitalized for further management.  Chest x-ray showed left upper lobe  infiltrate.     Assessment & Plan:   Acute hypoxic respiratory failure due to SARS COVID-19 viral pneumonia.  COVID-19 Labs  Recent Labs  Lab 12/27/18 2010 12/27/18 2016 12/28/18 0403 12/29/18 0340 12/30/18 0440 12/31/18 0520 01/01/19 0435  DDIMER  --  0.87* 0.78* 0.63* 0.52* 0.60*  --   FERRITIN  --   --   --  141 108 113  --   CRP 28.0*  --  26.8* 14.1* 5.9* 8.0* 8.0*  ALT 30  --  26 28 28  49* 47*  PROCALCITON 0.17  --   --   --   --   --   --     Objective findings: Fever: Remains afebrile Oxygen requirements: Patient requiring only 1 to 2 L of oxygen mainly with exertion.  Otherwise he saturating normal on room air.  COVID 19 Therapeutics: Antibacterials: None Remdesivir: Completed 5-day course on 12/10. Steroids: Dexamethasone 6 mg twice daily.   Diuretics: Give additional dose of furosemide today Actemra: Not given yet Convalescent Plasma: Not given yet Vitamin C and Zinc: Continue PUD Prophylaxis: Protonix DVT Prophylaxis:  Lovenox 65 mg daily  Patient's respiratory status is improving.  He seems to be anxious about his condition and thinks that he is not improving.  I have explained to him that objectively things are looking better.  PT and OT is working with the patient.  He will most likely need home oxygen.  Give another dose of furosemide today.  CRP stable at 8.0.    Small pneumothorax Chest x-ray done today showed small left apical  pneumothorax.  Stop incentive spirometry.  Will do serial chest x-rays.  Essential hypertension  Blood pressure is reasonably well controlled.  Lisinopril is on hold.  Amlodipine dose was increased.      Depression On sertraline, no confusion or agitation.  Obesity Estimated body mass index is 40.03 kg/m as calculated from the following:   Height as of this encounter: 5\' 10"  (1.778 m).   Weight as of this encounter: 126.6 kg.  Chronic diastolic heart failure Does not appear to be decompensated but we would like to keep him dry.  Getting furosemide on a daily basis.  CKD stage 3a Seems to be stable.  Continue monitoring urine output.  Check labs daily.    Mild transaminitis Most likely due to COVID-19.  Outpatient follow-up.  DVT prophylaxis: enoxaparin  Code Status: Full Code Family Communication:  Discussed with patient.  He states that he will notify his family members. Disposition Plan: PT and OT working with him.  Home health is recommended.   Consultants:   None  Procedures:   None  Antimicrobials:  Anti-infectives (From admission, onward)   Start     Dose/Rate Route Frequency Ordered Stop   12/28/18 1000  remdesivir 100 mg in sodium chloride 0.9 % 100 mL IVPB     100 mg 200 mL/hr over 30 Minutes Intravenous Daily 12/27/18 2235 01/01/19 1010   12/27/18 2300  remdesivir 200 mg in sodium chloride 0.9% 250 mL IVPB     200 mg 580 mL/hr over 30 Minutes Intravenous Once 12/27/18 2235 12/28/18 0131        Subjective: Patient noted to be anxious.  States that he has not improved much despite objective findings that he has actually improved with improvement in oxygenation.  Patient was reassured.  Objective: Vitals:   01/01/19 0527 01/01/19 0810 01/01/19 0811 01/01/19 1009  BP: (!) 149/87 128/60  130/64  Pulse: 70 60    Resp: 18     Temp: 98 F (36.7 C) 97.6 F (36.4 C)    TempSrc: Oral Oral    SpO2: 93% 94% 93%   Weight:      Height:         Intake/Output Summary (Last 24 hours) at 01/01/2019 1309 Last data filed at 01/01/2019 1000 Gross per 24 hour  Intake 480 ml  Output 1000 ml  Net -520 ml   Filed Weights   12/28/18 0900  Weight: 126.6 kg    Examination:   General appearance: Awake alert.  In no distress Resp: Normal effort at rest.  Coarse breath sound bilaterally with few crackles at the bases.   Cardio: S1-S2 is normal regular.  No S3-S4.  No rubs murmurs or bruit GI: Abdomen is soft.  Nontender nondistended.  Bowel sounds are present normal.  No masses organomegaly Extremities: No edema.  Full range of motion of lower extremities. Neurologic: Alert and oriented x3.  No focal neurological deficits.    Data Reviewed: I have personally reviewed following labs and imaging studies  CBC: Recent Labs  Lab 12/27/18 2010 12/28/18 0403 12/31/18 0520  WBC 9.2 6.6 10.5  NEUTROABS 8.6* 6.1  --   HGB 13.3 12.5* 13.0  HCT 42.9 40.1 41.4  MCV 86.5 85.5 85.0  PLT 266 247 419*   Basic Metabolic Panel: Recent Labs  Lab 12/28/18 0403 12/29/18 0340 12/30/18 0440 12/31/18 0520 01/01/19 0435  NA 138 138 139 138 138  K 4.1 4.2 3.7 5.1 4.1  CL 98 96* 99 97* 96*  CO2 28 30 30 28 28   GLUCOSE 169* 125* 112* 90 133*  BUN 22 31* 38* 36* 37*  CREATININE 1.28* 1.17 1.25* 1.25* 1.20  CALCIUM 8.3* 8.6* 8.4* 8.4* 8.4*   GFR: Estimated Creatinine Clearance: 76.5 mL/min (by C-G formula based on SCr of 1.2 mg/dL). Liver Function Tests: Recent Labs  Lab 12/28/18 0403 12/29/18 0340 12/30/18 0440 12/31/18 0520 01/01/19 0435  AST 32 32 28 50* 30  ALT 26 28 28  49* 47*  ALKPHOS 69 65 62 70 68  BILITOT 0.4 0.9 0.7 0.9 0.9  PROT 6.4* 6.8 6.5 6.1* 6.9  ALBUMIN 2.6* 2.9* 2.9* 2.8* 3.0*   Anemia Panel: Recent Labs    12/30/18 0440 12/31/18 0520  FERRITIN 108 113      Radiology Studies: DG CHEST PORT 1 VIEW  Result Date: 01/01/2019 CLINICAL DATA:  COVID-19 positive patient.  Shortness of breath EXAM:  PORTABLE CHEST 1 VIEW COMPARISON:  December 27, 2018 FINDINGS: Increasing patchy pulmonary infiltrates are identified, most marked in the bases, right greater than left. Stable cardiomegaly. The hila and mediastinum are normal. There appears to be a small left apical pneumothorax measuring 11 mm, not seen previously. IMPRESSION: 1. There appears to be a new 11 mm pneumothorax in the left apex. Recommend a PA and lateral xray for further confirmation. 2. Increasing bilateral pulmonary infiltrates. Findings called to the patient's nurse Melissa.  Electronically Signed   By: Gerome Samavid  Williams III M.D   On: 01/01/2019 11:34        Scheduled Meds: . allopurinol  100 mg Oral BID  . amLODipine  5 mg Oral q morning - 10a  . chlorpheniramine-HYDROcodone  5 mL Oral Q12H  . dexamethasone (DECADRON) injection  6 mg Intravenous Q12H  . enoxaparin (LOVENOX) injection  0.5 mg/kg Subcutaneous Q24H  . pantoprazole  40 mg Oral BID  . sertraline  200 mg Oral q morning - 10a  . vitamin C  500 mg Oral Daily  . zinc sulfate  220 mg Oral Daily   Continuous Infusions:    LOS: 5 days     Osvaldo ShipperGokul Kassidie Hendriks, MD  Pager: [email or web address]

## 2019-01-01 NOTE — Progress Notes (Signed)
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  He does have significant past medical history for diastolic heart failure, hypertension, TIAs, and chronic kidney disease stage III.  Patient was diagnosed with COVID-19 the day before Thanksgiving, November 25.   Patient was noted to be hypoxic.  He was hospitalized for further management.  Chest x-ray showed left upper lobe  infiltrate    PT Comments    Pt is highly apprehensive about mobility and his 02 saturations/ status. At therapist arrival to room pt has [place] in nose but flow is 0, pt insists he is at 1L. Completed bed mob with SBA and noted brief desat to 89% on room air. Pt able to depp breathe on his own and sats increase to low 90s. Ambulated 1 x 41ft in room with min guard assist on room and sats remained in 90s but at completion pt stated he felt as if someone was sitting on his chest. HR had increased to 109bpm but with seated rest and breathing able to return to low 70s. On latter attempt ambulated in hall approx 162ft with SBA-min guard assist initially but with HHA at end as pt became apprehensive and started staggering a lot more. Min desat with increased distance ambulation is still 84% and pt was able to recover saturation to 90s within 1-2 mins of sitting and breathing. Overall pt did very well with mobility and on room air.    Follow Up Recommendations  Home health PT     Equipment Recommendations  None recommended by PT    Recommendations for Other Services       Precautions / Restrictions Precautions Precautions: Fall    Mobility  Bed Mobility Overal bed mobility: Needs Assistance Bed Mobility: Supine to Sit     Supine to sit: Supervision     General bed mobility comments: needs bed rail to complete  Transfers Overall transfer level: Needs  assistance Equipment used: None Transfers: Sit to/from Stand;Stand Pivot Transfers Sit to Stand: Min guard;Supervision Stand pivot transfers: Min guard;Supervision          Ambulation/Gait Ambulation/Gait assistance: Supervision Gait Distance (Feet): 130 Feet Assistive device: None(declined to use cane) Gait Pattern/deviations: Step-through pattern;Wide base of support;Staggering left;Staggering right Gait velocity: decr   General Gait Details: ambulated on room air and sats min reading 84% but w/ fatigue starts to stagger much more and c/o not having enough air   Stairs             Wheelchair Mobility    Modified Rankin (Stroke Patients Only)       Balance Overall balance assessment: History of Falls;Mild deficits observed, not formally tested   Sitting balance-Leahy Scale: Good       Standing balance-Leahy Scale: Fair                              Cognition Arousal/Alertness: Awake/alert Behavior During Therapy: Anxious Overall Cognitive Status: Within Functional Limits for tasks assessed                                        Exercises Other Exercises Other Exercises: reinforced seated ther ex    General Comments  Pertinent Vitals/Pain Pain Assessment: Faces Faces Pain Scale: Hurts little more Pain Location: w/ deep breathing Pain Intervention(s): Limited activity within patient's tolerance    Home Living                      Prior Function            PT Goals (current goals can now be found in the care plan section) Acute Rehab PT Goals Patient Stated Goal: be able o breathe without feeling like someone is sitting on my chest PT Goal Formulation: With patient Time For Goal Achievement: 01/14/19 Potential to Achieve Goals: Good Progress towards PT goals: Progressing toward goals    Frequency    Min 3X/week      PT Plan Current plan remains appropriate    Co-evaluation               AM-PAC PT "6 Clicks" Mobility   Outcome Measure  Help needed turning from your back to your side while in a flat bed without using bedrails?: None Help needed moving from lying on your back to sitting on the side of a flat bed without using bedrails?: None Help needed moving to and from a bed to a chair (including a wheelchair)?: None Help needed standing up from a chair using your arms (e.g., wheelchair or bedside chair)?: None Help needed to walk in hospital room?: A Little Help needed climbing 3-5 steps with a railing? : A Lot 6 Click Score: 21    End of Session   Activity Tolerance: Patient tolerated treatment well;Patient limited by fatigue;Patient limited by lethargy Patient left: in chair;with call bell/phone within reach Nurse Communication: Mobility status PT Visit Diagnosis: History of falling (Z91.81)     Time: 0940-1009 PT Time Calculation (min) (ACUTE ONLY): 29 min  Charges:  $Gait Training: 8-22 mins $Therapeutic Activity: 8-22 mins                     Horald Chestnut, PT    Delford Field 01/01/2019, 1:00 PM

## 2019-01-01 NOTE — Progress Notes (Signed)
Pt 94% on 2 L [place], weaned to RA while sleeping in bed. sats maintained 92-93%. Will continue to monitor

## 2019-01-02 ENCOUNTER — Inpatient Hospital Stay (HOSPITAL_COMMUNITY): Payer: Medicare Other

## 2019-01-02 LAB — CBC
HCT: 43.8 % (ref 39.0–52.0)
Hemoglobin: 13.6 g/dL (ref 13.0–17.0)
MCH: 26.6 pg (ref 26.0–34.0)
MCHC: 31.1 g/dL (ref 30.0–36.0)
MCV: 85.5 fL (ref 80.0–100.0)
Platelets: 587 10*3/uL — ABNORMAL HIGH (ref 150–400)
RBC: 5.12 MIL/uL (ref 4.22–5.81)
RDW: 16.1 % — ABNORMAL HIGH (ref 11.5–15.5)
WBC: 12.4 10*3/uL — ABNORMAL HIGH (ref 4.0–10.5)
nRBC: 0 % (ref 0.0–0.2)

## 2019-01-02 LAB — COMPREHENSIVE METABOLIC PANEL
ALT: 51 U/L — ABNORMAL HIGH (ref 0–44)
AST: 28 U/L (ref 15–41)
Albumin: 3.1 g/dL — ABNORMAL LOW (ref 3.5–5.0)
Alkaline Phosphatase: 69 U/L (ref 38–126)
Anion gap: 10 (ref 5–15)
BUN: 42 mg/dL — ABNORMAL HIGH (ref 8–23)
CO2: 32 mmol/L (ref 22–32)
Calcium: 8.7 mg/dL — ABNORMAL LOW (ref 8.9–10.3)
Chloride: 95 mmol/L — ABNORMAL LOW (ref 98–111)
Creatinine, Ser: 1.28 mg/dL — ABNORMAL HIGH (ref 0.61–1.24)
GFR calc Af Amer: >60 mL/min (ref >60)
GFR calc non Af Amer: 56 mL/min — ABNORMAL LOW (ref >60)
Glucose, Bld: 152 mg/dL — ABNORMAL HIGH (ref 70–99)
Potassium: 4.7 mmol/L (ref 3.5–5.1)
Sodium: 137 mmol/L (ref 135–145)
Total Bilirubin: 1.2 mg/dL (ref 0.3–1.2)
Total Protein: 7.1 g/dL (ref 6.5–8.1)

## 2019-01-02 LAB — C-REACTIVE PROTEIN: CRP: 4.4 mg/dL — ABNORMAL HIGH (ref <1.0)

## 2019-01-02 MED ORDER — DEXAMETHASONE SODIUM PHOSPHATE 10 MG/ML IJ SOLN
6.0000 mg | INTRAMUSCULAR | Status: DC
  Administered 2019-01-03 – 2019-01-06 (×4): 6 mg via INTRAVENOUS
  Filled 2019-01-02 (×4): qty 1

## 2019-01-02 NOTE — Progress Notes (Signed)
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  He does have significant past medical history for diastolic heart failure, hypertension, TIAs, and chronic kidney disease stage III.  Patient was diagnosed with COVID-19 the day before Thanksgiving, November 25.   Patient was noted to be hypoxic.  He was hospitalized for further management.  Chest x-ray showed left upper lobe  infiltrate.     Assessment & Plan:   Acute hypoxic respiratory failure due to SARS COVID-19 viral pneumonia.  COVID-19 Labs  Recent Labs  Lab 12/27/18 2010 12/27/18 2016 12/28/18 0403 12/29/18 0340 12/30/18 0440 12/31/18 0520 01/01/19 0435 01/02/19 0246  DDIMER  --  0.87* 0.78* 0.63* 0.52* 0.60*  --   --   FERRITIN  --   --   --  141 108 113  --   --   CRP 28.0*  --  26.8* 14.1* 5.9* 8.0* 8.0* 4.4*  ALT 30  --  26 28 28  49* 47* 51*  PROCALCITON 0.17  --   --   --   --   --   --   --     Objective findings: Fever: Remains afebrile Oxygen requirements: Nasal cannula.  1 to 2 L/min.  Saturating in the early 90s.    COVID 19 Therapeutics: Antibacterials: None Remdesivir: Completed 5-day course on 12/10. Steroids: Dexamethasone 6 mg twice daily.  We will start tapering. Diuretics: Lasix given the last 2 days.  We will hold for today.  Today Actemra: Not given yet Convalescent Plasma: Not given yet Vitamin C and Zinc: Continue PUD Prophylaxis: Protonix DVT Prophylaxis:  Lovenox 65 mg daily  Patient's respiratory status is stable.  He will most likely need to go home with home oxygen.  His inflammatory markers have improved.  Start tapering steroids.  PT and OT evaluation.  He will need home health.  CRP is down to 4.4.  Anxiety is playing a role as well.  Small pneumothorax Chest x-ray from 12/11 showed a small left apical pneumothorax.  Discussed with  pulmonology.  He was placed on nonrebreather for 24 hours.  Chest x-ray repeated this morning does not show any pneumothorax.  Will do another chest x-ray tomorrow.    Essential hypertension  Blood pressure is reasonably well controlled.  Lisinopril is on hold.  Amlodipine dose was increased.      Depression On sertraline, no confusion or agitation.  Obesity Estimated body mass index is 40.03 kg/m as calculated from the following:   Height as of this encounter: 5\' 10"  (1.778 m).   Weight as of this encounter: 126.6 kg.  Chronic diastolic heart failure Seems to be reasonably well compensated.  He has received Lasix the last few days.  Hold for today.  CKD stage 3a Renal function is stable.  Continue to monitor urine output.  Mild transaminitis Most likely due to COVID-19.  Outpatient follow-up.  DVT prophylaxis: enoxaparin  Code Status: Full Code Family Communication:  Discussed with patient.  He states that he will notify his family members. Disposition Plan: PT and OT working with him.  Home health is recommended.   Consultants:   None  Procedures:   None  Antimicrobials:  Anti-infectives (From admission, onward)   Start     Dose/Rate Route Frequency Ordered Stop   12/28/18 1000  remdesivir 100 mg in sodium chloride 0.9 % 100  mL IVPB     100 mg 200 mL/hr over 30 Minutes Intravenous Daily 12/27/18 2235 01/01/19 1010   12/27/18 2300  remdesivir 200 mg in sodium chloride 0.9% 250 mL IVPB     200 mg 580 mL/hr over 30 Minutes Intravenous Once 12/27/18 2235 12/28/18 0131        Subjective: Patient was told about the findings of chest x-ray from this morning.  He felt reassured.  He denies any new complaints.  Shortness of breath is improving.  Objective: Vitals:   01/01/19 1525 01/01/19 1644 01/01/19 2043 01/02/19 0635  BP:  137/74 125/72 130/75  Pulse:  84 79 66  Resp:  20 18   Temp:  98.1 F (36.7 C) 97.7 F (36.5 C) 97.6 F (36.4 C)  TempSrc:  Oral  Axillary Axillary  SpO2: 98% 99% 97% 98%  Weight:      Height:        Intake/Output Summary (Last 24 hours) at 01/02/2019 1446 Last data filed at 01/02/2019 1540 Gross per 24 hour  Intake 400 ml  Output 1700 ml  Net -1300 ml   Filed Weights   12/28/18 0900  Weight: 126.6 kg    Examination:   General appearance: Awake alert.  In no distress Resp: Normal effort at rest.  Coarse breath sound bilaterally with a few crackles at the bases.  No wheezing or rhonchi.   Cardio: S1-S2 is normal regular.  No S3-S4.  No rubs murmurs or bruit GI: Abdomen is soft.  Nontender nondistended.  Bowel sounds are present normal.  No masses organomegaly Extremities: No edema.  Full range of motion of lower extremities. Neurologic: Alert and oriented x3.  No focal neurological deficits.    Data Reviewed: I have personally reviewed following labs and imaging studies  CBC: Recent Labs  Lab 12/27/18 2010 12/28/18 0403 12/31/18 0520 01/02/19 0246  WBC 9.2 6.6 10.5 12.4*  NEUTROABS 8.6* 6.1  --   --   HGB 13.3 12.5* 13.0 13.6  HCT 42.9 40.1 41.4 43.8  MCV 86.5 85.5 85.0 85.5  PLT 266 247 438* 587*   Basic Metabolic Panel: Recent Labs  Lab 12/29/18 0340 12/30/18 0440 12/31/18 0520 01/01/19 0435 01/02/19 0246  NA 138 139 138 138 137  K 4.2 3.7 5.1 4.1 4.7  CL 96* 99 97* 96* 95*  CO2 30 30 28 28  32  GLUCOSE 125* 112* 90 133* 152*  BUN 31* 38* 36* 37* 42*  CREATININE 1.17 1.25* 1.25* 1.20 1.28*  CALCIUM 8.6* 8.4* 8.4* 8.4* 8.7*   GFR: Estimated Creatinine Clearance: 71.7 mL/min (A) (by C-G formula based on SCr of 1.28 mg/dL (H)). Liver Function Tests: Recent Labs  Lab 12/29/18 0340 12/30/18 0440 12/31/18 0520 01/01/19 0435 01/02/19 0246  AST 32 28 50* 30 28  ALT 28 28 49* 47* 51*  ALKPHOS 65 62 70 68 69  BILITOT 0.9 0.7 0.9 0.9 1.2  PROT 6.8 6.5 6.1* 6.9 7.1  ALBUMIN 2.9* 2.9* 2.8* 3.0* 3.1*   Anemia Panel: Recent Labs    12/31/18 0520  FERRITIN 113       Radiology Studies: DG CHEST PORT 1 VIEW  Result Date: 01/02/2019 CLINICAL DATA:  68 year old male, COVID-19. Portable chest EXAM: PORTABLE CHEST 1 VIEW COMPARISON:  01/01/2019 and earlier. FINDINGS: Portable AP semi upright view at 0559 hours. The small left pneumothorax visible in the apex yesterday is not evident. There might be a small volume of left apical pleural fluid. Stable lung volumes and mediastinal  contours. Widely scattered patchy and indistinct opacity, mostly in the lung periphery and bases has progressed since 12/27/2018, not significantly changed since yesterday. Visualized tracheal air column is within normal limits. No acute osseous abnormality identified. IMPRESSION: 1. No left pneumothorax identified today. 2. Bilateral COVID-19 pneumonia stable since yesterday. Electronically Signed   By: Genevie Ann M.D.   On: 01/02/2019 08:25   DG CHEST PORT 1 VIEW  Result Date: 01/01/2019 CLINICAL DATA:  COVID-19 positive patient.  Shortness of breath EXAM: PORTABLE CHEST 1 VIEW COMPARISON:  December 27, 2018 FINDINGS: Increasing patchy pulmonary infiltrates are identified, most marked in the bases, right greater than left. Stable cardiomegaly. The hila and mediastinum are normal. There appears to be a small left apical pneumothorax measuring 11 mm, not seen previously. IMPRESSION: 1. There appears to be a new 11 mm pneumothorax in the left apex. Recommend a PA and lateral xray for further confirmation. 2. Increasing bilateral pulmonary infiltrates. Findings called to the patient's nurse Melissa. Electronically Signed   By: Dorise Bullion III M.D   On: 01/01/2019 11:34        Scheduled Meds: . allopurinol  100 mg Oral BID  . amLODipine  5 mg Oral q morning - 10a  . chlorpheniramine-HYDROcodone  5 mL Oral Q12H  . dexamethasone (DECADRON) injection  6 mg Intravenous Q12H  . enoxaparin (LOVENOX) injection  0.5 mg/kg Subcutaneous Q24H  . pantoprazole  40 mg Oral BID  . sertraline   200 mg Oral q morning - 10a  . vitamin C  500 mg Oral Daily  . zinc sulfate  220 mg Oral Daily   Continuous Infusions:    LOS: 6 days     Bonnielee Haff, MD  Pager: [email or web address]

## 2019-01-02 NOTE — Progress Notes (Signed)
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  He does have significant past medical history for diastolic heart failure, hypertension, TIAs, and chronic kidney disease stage III.  Patient was diagnosed with COVID-19 the day before Thanksgiving, November 25.   Patient was noted to be hypoxic.  He was hospitalized for further management.  Chest x-ray showed left upper lobe  infiltrate    PT Comments    Pt apprehensive and anxious this pm, states did not sleep well sec to having to wear NRB last night. He states new imaging was found and new developments in lung condition have him worried. Therapist attempted to calm pt but pt is highly anxious about being on supplemental 02 at precise levels physician has prescribed. Pt was on 4L/min via HFNC, and satting at 89% pt completed bed mob with mod I and sat EOB unsupported for few mins, sats fluctuated between high 80s and low 90s, able to stand and take few steps to recliner w/ set up and SBA. Pt lunch arrived at this time and requested to eat lunch rather than continue.    Follow Up Recommendations  Home health PT     Equipment Recommendations  None recommended by PT    Recommendations for Other Services       Precautions / Restrictions Precautions Precautions: Fall Restrictions Weight Bearing Restrictions: No    Mobility  Bed Mobility Overal bed mobility: Modified Independent                Transfers Overall transfer level: Needs assistance Equipment used: None Transfers: Sit to/from Stand;Stand Pivot Transfers Sit to Stand: Supervision Stand pivot transfers: Supervision       General transfer comment: needs set up and line management  Ambulation/Gait                 Stairs             Wheelchair Mobility    Modified Rankin (Stroke  Patients Only)       Balance Overall balance assessment: History of Falls;Mild deficits observed, not formally tested   Sitting balance-Leahy Scale: Good       Standing balance-Leahy Scale: Fair                              Cognition Arousal/Alertness: Awake/alert Behavior During Therapy: WFL for tasks assessed/performed Overall Cognitive Status: Within Functional Limits for tasks assessed                                        Exercises      General Comments        Pertinent Vitals/Pain      Home Living                      Prior Function            PT Goals (current goals can now be found in the care plan section) Acute Rehab PT Goals Patient Stated Goal: very apprehensive about an incident over night where he was placed on NRB, states did not sleep weell sec to noises and discomfort Time For Goal Achievement: 01/14/19 Potential to Achieve Goals: Good    Frequency  Min 3X/week      PT Plan Current plan remains appropriate    Co-evaluation              AM-PAC PT "6 Clicks" Mobility   Outcome Measure  Help needed turning from your back to your side while in a flat bed without using bedrails?: None Help needed moving from lying on your back to sitting on the side of a flat bed without using bedrails?: None Help needed moving to and from a bed to a chair (including a wheelchair)?: None Help needed standing up from a chair using your arms (e.g., wheelchair or bedside chair)?: None Help needed to walk in hospital room?: A Little Help needed climbing 3-5 steps with a railing? : A Lot 6 Click Score: 21    End of Session Equipment Utilized During Treatment: Oxygen Activity Tolerance: Patient limited by lethargy Patient left: in chair;with call bell/phone within reach   PT Visit Diagnosis: History of falling (Z91.81)     Time: 6073-7106 PT Time Calculation (min) (ACUTE ONLY): 13 min  Charges:   $Therapeutic Activity: 8-22 mins                     Drema Pry, PT    Freddi Starr 01/02/2019, 1:48 PM

## 2019-01-03 ENCOUNTER — Inpatient Hospital Stay (HOSPITAL_COMMUNITY): Payer: Medicare Other

## 2019-01-03 LAB — COMPREHENSIVE METABOLIC PANEL
ALT: 56 U/L — ABNORMAL HIGH (ref 0–44)
AST: 29 U/L (ref 15–41)
Albumin: 2.9 g/dL — ABNORMAL LOW (ref 3.5–5.0)
Alkaline Phosphatase: 70 U/L (ref 38–126)
Anion gap: 9 (ref 5–15)
BUN: 39 mg/dL — ABNORMAL HIGH (ref 8–23)
CO2: 30 mmol/L (ref 22–32)
Calcium: 8.7 mg/dL — ABNORMAL LOW (ref 8.9–10.3)
Chloride: 99 mmol/L (ref 98–111)
Creatinine, Ser: 1.21 mg/dL (ref 0.61–1.24)
GFR calc Af Amer: >60 mL/min (ref >60)
GFR calc non Af Amer: >60 mL/min (ref >60)
Glucose, Bld: 85 mg/dL (ref 70–99)
Potassium: 4.6 mmol/L (ref 3.5–5.1)
Sodium: 138 mmol/L (ref 135–145)
Total Bilirubin: 1 mg/dL (ref 0.3–1.2)
Total Protein: 6.8 g/dL (ref 6.5–8.1)

## 2019-01-03 LAB — CBC
HCT: 43.8 % (ref 39.0–52.0)
Hemoglobin: 13.6 g/dL (ref 13.0–17.0)
MCH: 26.7 pg (ref 26.0–34.0)
MCHC: 31.1 g/dL (ref 30.0–36.0)
MCV: 86.1 fL (ref 80.0–100.0)
Platelets: 645 10*3/uL — ABNORMAL HIGH (ref 150–400)
RBC: 5.09 MIL/uL (ref 4.22–5.81)
RDW: 16 % — ABNORMAL HIGH (ref 11.5–15.5)
WBC: 14.4 10*3/uL — ABNORMAL HIGH (ref 4.0–10.5)
nRBC: 0 % (ref 0.0–0.2)

## 2019-01-03 LAB — C-REACTIVE PROTEIN: CRP: 1.7 mg/dL — ABNORMAL HIGH (ref <1.0)

## 2019-01-03 NOTE — Progress Notes (Signed)
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  He does have significant past medical history for diastolic heart failure, hypertension, TIAs, and chronic kidney disease stage III.  Patient was diagnosed with COVID-19 the day before Thanksgiving, November 25.   Patient was noted to be hypoxic.  He was hospitalized for further management.  Chest x-ray showed left upper lobe  infiltrate.     Assessment & Plan:   Acute hypoxic respiratory failure due to SARS COVID-19 viral pneumonia.  COVID-19 Labs  Recent Labs  Lab 12/27/18 2010 12/27/18 2016 12/28/18 0403 12/29/18 0340 12/30/18 0440 12/31/18 0520 01/01/19 0435 01/02/19 0246 01/03/19 0710  DDIMER  --  0.87* 0.78* 0.63* 0.52* 0.60*  --   --   --   FERRITIN  --   --   --  141 108 113  --   --   --   CRP 28.0*  --  26.8* 14.1* 5.9* 8.0* 8.0* 4.4* 1.7*  ALT 30  --  26 28 28  49* 47* 51* 56*  PROCALCITON 0.17  --   --   --   --   --   --   --   --     Objective findings: Fever: Remains afebrile Oxygen requirements: Nasal cannula 1 to 2 L.  Saturating in the early 90s.    COVID 19 Therapeutics: Antibacterials: None Remdesivir: Completed 5-day course on 12/10. Steroids: Dexamethasone is being tapered. Diuretics: Has received Lasix intermittently.  Not in the last 48 hours. Actemra: Not given yet Convalescent Plasma: Not given yet Vitamin C and Zinc: Continue PUD Prophylaxis: Protonix DVT Prophylaxis:  Lovenox 65 mg daily  Patient's respiratory status has improved.  He is stable.  He is requiring only 1 to 2 L of oxygen by nasal cannula.  Will most likely need home oxygen.  This will be ordered.  CRP has improved.  Patient is anxious.  He was reassured.  Small pneumothorax Chest x-ray from 12/11 showed a small left apical pneumothorax.  Discussed with pulmonology.  He was placed on  nonrebreather for 24 hours.  Chest x-ray repeated yesterday as well as this morning.  No pneumothorax seen.  Patient is stable.  No further work-up.    Essential hypertension  Pressure is reasonably well controlled.  Continue amlodipine at the higher dose.  Lisinopril on hold.  Can be resumed at discharge.  Depression On sertraline, no confusion or agitation.  Obesity Estimated body mass index is 40.03 kg/m as calculated from the following:   Height as of this encounter: 5\' 10"  (1.778 m).   Weight as of this encounter: 126.6 kg.  Chronic diastolic heart failure Seems to be reasonably well compensated.  Save Lasix intermittently.  CKD stage 3a Renal function is stable.  Continue to monitor urine output.  Mild transaminitis Most likely due to COVID-19.  Outpatient follow-up.  DVT prophylaxis: enoxaparin  Code Status: Full Code Family Communication:  Discussed with patient.  He states that he will notify his family members. Disposition Plan: He will need home health.  Home oxygen.  Anticipate discharge tomorrow.   Consultants:   None  Procedures:   None  Antimicrobials:  Anti-infectives (From admission, onward)   Start     Dose/Rate Route Frequency Ordered Stop   12/28/18 1000  remdesivir 100 mg in sodium chloride 0.9 % 100 mL IVPB  100 mg 200 mL/hr over 30 Minutes Intravenous Daily 12/27/18 2235 01/01/19 1010   12/27/18 2300  remdesivir 200 mg in sodium chloride 0.9% 250 mL IVPB     200 mg 580 mL/hr over 30 Minutes Intravenous Once 12/27/18 2235 12/28/18 0131        Subjective: Patient remains anxious.  He was reassured.  Denies any complaints.  Shortness of breath has improved.  Objective: Vitals:   01/02/19 1734 01/02/19 1930 01/03/19 0400 01/03/19 0920  BP:  118/71 118/61 135/70  Pulse:  80  89  Resp:  18  20  Temp: 97.7 F (36.5 C) 98.6 F (37 C) 98.1 F (36.7 C) 98.2 F (36.8 C)  TempSrc: Oral Oral Oral Oral  SpO2:  92%  91%  Weight:        Height:        Intake/Output Summary (Last 24 hours) at 01/03/2019 1204 Last data filed at 01/02/2019 1734 Gross per 24 hour  Intake 650 ml  Output 450 ml  Net 200 ml   Filed Weights   12/28/18 0900  Weight: 126.6 kg    Examination:   General appearance: Awake alert.  In no distress Resp: Normal effort at rest.  Coarse breath sounds bilaterally.  Few crackles at the bases.  No wheezing or rhonchi.   Cardio: S1-S2 is normal regular.  No S3-S4.  No rubs murmurs or bruit GI: Abdomen is soft.  Nontender nondistended.  Bowel sounds are present normal.  No masses organomegaly Extremities: No edema.  Full range of motion of lower extremities. Neurologic: Alert and oriented x3.  No focal neurological deficits.     Data Reviewed: I have personally reviewed following labs and imaging studies  CBC: Recent Labs  Lab 12/27/18 2010 12/28/18 0403 12/31/18 0520 01/02/19 0246 01/03/19 0710  WBC 9.2 6.6 10.5 12.4* 14.4*  NEUTROABS 8.6* 6.1  --   --   --   HGB 13.3 12.5* 13.0 13.6 13.6  HCT 42.9 40.1 41.4 43.8 43.8  MCV 86.5 85.5 85.0 85.5 86.1  PLT 266 247 438* 587* 188*   Basic Metabolic Panel: Recent Labs  Lab 12/30/18 0440 12/31/18 0520 01/01/19 0435 01/02/19 0246 01/03/19 0710  NA 139 138 138 137 138  K 3.7 5.1 4.1 4.7 4.6  CL 99 97* 96* 95* 99  CO2 30 28 28  32 30  GLUCOSE 112* 90 133* 152* 85  BUN 38* 36* 37* 42* 39*  CREATININE 1.25* 1.25* 1.20 1.28* 1.21  CALCIUM 8.4* 8.4* 8.4* 8.7* 8.7*   GFR: Estimated Creatinine Clearance: 75.8 mL/min (by C-G formula based on SCr of 1.21 mg/dL). Liver Function Tests: Recent Labs  Lab 12/30/18 0440 12/31/18 0520 01/01/19 0435 01/02/19 0246 01/03/19 0710  AST 28 50* 30 28 29   ALT 28 49* 47* 51* 56*  ALKPHOS 62 70 68 69 70  BILITOT 0.7 0.9 0.9 1.2 1.0  PROT 6.5 6.1* 6.9 7.1 6.8  ALBUMIN 2.9* 2.8* 3.0* 3.1* 2.9*   Anemia Panel: No results for input(s): VITAMINB12, FOLATE, FERRITIN, TIBC, IRON, RETICCTPCT in the last  72 hours.    Radiology Studies: DG CHEST PORT 1 VIEW  Result Date: 01/03/2019 CLINICAL DATA:  Pneumothorax. EXAM: PORTABLE CHEST 1 VIEW COMPARISON:  January 02, 2019. FINDINGS: The heart size and mediastinal contours are within normal limits. No pneumothorax or pleural effusion is noted. Stable bilateral lung opacities are noted consistent with multifocal pneumonia. The visualized skeletal structures are unremarkable. IMPRESSION: Stable bilateral lung opacities are noted consistent with  multifocal pneumonia. Electronically Signed   By: Lupita Raider M.D.   On: 01/03/2019 10:39   DG CHEST PORT 1 VIEW  Result Date: 01/02/2019 CLINICAL DATA:  68 year old male, COVID-19. Portable chest EXAM: PORTABLE CHEST 1 VIEW COMPARISON:  01/01/2019 and earlier. FINDINGS: Portable AP semi upright view at 0559 hours. The small left pneumothorax visible in the apex yesterday is not evident. There might be a small volume of left apical pleural fluid. Stable lung volumes and mediastinal contours. Widely scattered patchy and indistinct opacity, mostly in the lung periphery and bases has progressed since 12/27/2018, not significantly changed since yesterday. Visualized tracheal air column is within normal limits. No acute osseous abnormality identified. IMPRESSION: 1. No left pneumothorax identified today. 2. Bilateral COVID-19 pneumonia stable since yesterday. Electronically Signed   By: Odessa Fleming M.D.   On: 01/02/2019 08:25   DG CHEST PORT 1 VIEW  Result Date: 01/01/2019 CLINICAL DATA:  COVID-19 positive patient.  Shortness of breath EXAM: PORTABLE CHEST 1 VIEW COMPARISON:  December 27, 2018 FINDINGS: Increasing patchy pulmonary infiltrates are identified, most marked in the bases, right greater than left. Stable cardiomegaly. The hila and mediastinum are normal. There appears to be a small left apical pneumothorax measuring 11 mm, not seen previously. IMPRESSION: 1. There appears to be a new 11 mm pneumothorax in the  left apex. Recommend a PA and lateral xray for further confirmation. 2. Increasing bilateral pulmonary infiltrates. Findings called to the patient's nurse Melissa. Electronically Signed   By: Gerome Sam III M.D   On: 01/01/2019 11:34        Scheduled Meds: . allopurinol  100 mg Oral BID  . amLODipine  5 mg Oral q morning - 10a  . chlorpheniramine-HYDROcodone  5 mL Oral Q12H  . dexamethasone (DECADRON) injection  6 mg Intravenous Q24H  . enoxaparin (LOVENOX) injection  0.5 mg/kg Subcutaneous Q24H  . pantoprazole  40 mg Oral BID  . sertraline  200 mg Oral q morning - 10a  . vitamin C  500 mg Oral Daily  . zinc sulfate  220 mg Oral Daily   Continuous Infusions:    LOS: 7 days     Osvaldo Shipper, MD  Pager: [email or web address]

## 2019-01-04 MED ORDER — DEXAMETHASONE 2 MG PO TABS: ORAL_TABLET | ORAL | 0 refills | Status: DC

## 2019-01-04 MED ORDER — BENZONATATE 100 MG PO CAPS: 100.0000 mg | ORAL_CAPSULE | Freq: Three times a day (TID) | ORAL | 0 refills | Status: DC | PRN

## 2019-01-04 MED ORDER — FUROSEMIDE 10 MG/ML IJ SOLN
40.0000 mg | Freq: Once | INTRAMUSCULAR | Status: AC
  Administered 2019-01-04: 16:00:00 40 mg via INTRAVENOUS
  Filled 2019-01-04: qty 4

## 2019-01-04 NOTE — Progress Notes (Signed)
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  He does have significant past medical history for diastolic heart failure, hypertension, TIAs, and chronic kidney disease stage III.  Patient was diagnosed with COVID-19 the day before Thanksgiving, November 25.   Patient was noted to be hypoxic.  He was hospitalized for further management.  Chest x-ray showed left upper lobe  infiltrate.     Assessment & Plan:   Acute hypoxic respiratory failure due to SARS COVID-19 viral pneumonia.  COVID-19 Labs  Recent Labs  Lab 12/29/18 0340 12/30/18 0440 12/31/18 0520 01/01/19 0435 01/02/19 0246 01/03/19 0710  DDIMER 0.63* 0.52* 0.60*  --   --   --   FERRITIN 141 108 113  --   --   --   CRP 14.1* 5.9* 8.0* 8.0* 4.4* 1.7*  ALT 28 28 49* 47* 51* 56*    Objective findings: Fever: Remains afebrile Oxygen requirements: At rest he is requiring at most 1 to 2 L of oxygen.  Most of the time he saturating well on room air.  This morning when he was ambulated he dropped into the 70s.  Had to be bumped up to 6 L.  Felt extremely fatigued and almost passed out per nursing staff.     COVID 19 Therapeutics: Antibacterials: None Remdesivir: Completed 5-day course on 12/10. Steroids: Dexamethasone is being tapered. Diuretics: Has received Lasix intermittently.  Not in the last 72 hours.  Will give additional dose today. Actemra: Not given yet Convalescent Plasma: Not given yet Vitamin C and Zinc: Continue PUD Prophylaxis: Protonix DVT Prophylaxis:  Lovenox 65 mg daily  Patient's respiratory status has improved.  However he is very deconditioned.  He tends to desaturate with exertion.  Initially plan was for him to be discharged today however he feels extremely fatigued and that he almost passed out when he ambulated.  Physical therapy needs to continue to work  with him.  We will give him additional dose of Lasix today.  Small pneumothorax Chest x-ray from 12/11 showed a small left apical pneumothorax.  Discussed with pulmonology.  He was placed on nonrebreather for 24 hours.  Chest x-ray repeated and shows resolution of the pneumothorax.     Essential hypertension  Blood pressure is reasonably well controlled.  Continue higher dose of amlodipine.  Lisinopril is on hold.  Can be resumed at discharge.    Depression On sertraline, no confusion or agitation.  Obesity Estimated body mass index is 40.03 kg/m as calculated from the following:   Height as of this encounter: 5\' 10"  (1.778 m).   Weight as of this encounter: 126.6 kg.  Chronic diastolic heart failure Seems to be reasonably well compensated.  He received Lasix intermittently.  CKD stage 3a Renal function is stable.  Continue to monitor urine output.  Mild transaminitis Most likely due to COVID-19.  Outpatient follow-up.  DVT prophylaxis: enoxaparin  Code Status: Full Code Family Communication:  Discussed with patient.  He states that he will update his family members. Disposition Plan: He will need home health.  He will need home oxygen.  However he is still very deconditioned.  He lives by himself.  Not considered safe for discharge at this time due to significant hypoxia with minimal exertion.  PT to continue to work with him.  May need higher level of care.   Consultants:  None  Procedures:   None  Antimicrobials:  Anti-infectives (From admission, onward)   Start     Dose/Rate Route Frequency Ordered Stop   12/28/18 1000  remdesivir 100 mg in sodium chloride 0.9 % 100 mL IVPB     100 mg 200 mL/hr over 30 Minutes Intravenous Daily 12/27/18 2235 01/01/19 1010   12/27/18 2300  remdesivir 200 mg in sodium chloride 0.9% 250 mL IVPB     200 mg 580 mL/hr over 30 Minutes Intravenous Once 12/27/18 2235 12/28/18 0131        Subjective: Patient remains anxious.  He  was reassured.  Denies any complaints otherwise.  Objective: Vitals:   01/04/19 0830 01/04/19 1140 01/04/19 1142 01/04/19 1145  BP: (!) 128/96     Pulse:      Resp:      Temp:      TempSrc:      SpO2: 94% 94% (!) 78% 90%  Weight:      Height:        Intake/Output Summary (Last 24 hours) at 01/04/2019 1204 Last data filed at 01/04/2019 0900 Gross per 24 hour  Intake 840 ml  Output 1125 ml  Net -285 ml   Filed Weights   12/28/18 0900  Weight: 126.6 kg    Examination:   General appearance: Awake alert.  In no distress Resp: Normal effort at rest.  Coarse breath sound bilaterally.  Few crackles at the bases.  No wheezing or rhonchi. Cardio: S1-S2 is normal regular.  No S3-S4.  No rubs murmurs or bruit GI: Abdomen is soft.  Nontender nondistended.  Bowel sounds are present normal.  No masses organomegaly Extremities: No edema.  Full range of motion of lower extremities. Neurologic: Alert and oriented x3.  No focal neurological deficits.     Data Reviewed: I have personally reviewed following labs and imaging studies  CBC: Recent Labs  Lab 12/31/18 0520 01/02/19 0246 01/03/19 0710  WBC 10.5 12.4* 14.4*  HGB 13.0 13.6 13.6  HCT 41.4 43.8 43.8  MCV 85.0 85.5 86.1  PLT 438* 587* 008*   Basic Metabolic Panel: Recent Labs  Lab 12/30/18 0440 12/31/18 0520 01/01/19 0435 01/02/19 0246 01/03/19 0710  NA 139 138 138 137 138  K 3.7 5.1 4.1 4.7 4.6  CL 99 97* 96* 95* 99  CO2 30 28 28  32 30  GLUCOSE 112* 90 133* 152* 85  BUN 38* 36* 37* 42* 39*  CREATININE 1.25* 1.25* 1.20 1.28* 1.21  CALCIUM 8.4* 8.4* 8.4* 8.7* 8.7*   GFR: Estimated Creatinine Clearance: 75.8 mL/min (by C-G formula based on SCr of 1.21 mg/dL). Liver Function Tests: Recent Labs  Lab 12/30/18 0440 12/31/18 0520 01/01/19 0435 01/02/19 0246 01/03/19 0710  AST 28 50* 30 28 29   ALT 28 49* 47* 51* 56*  ALKPHOS 62 70 68 69 70  BILITOT 0.7 0.9 0.9 1.2 1.0  PROT 6.5 6.1* 6.9 7.1 6.8  ALBUMIN  2.9* 2.8* 3.0* 3.1* 2.9*   Anemia Panel: No results for input(s): VITAMINB12, FOLATE, FERRITIN, TIBC, IRON, RETICCTPCT in the last 72 hours.    Radiology Studies: DG CHEST PORT 1 VIEW  Result Date: 01/03/2019 CLINICAL DATA:  Pneumothorax. EXAM: PORTABLE CHEST 1 VIEW COMPARISON:  January 02, 2019. FINDINGS: The heart size and mediastinal contours are within normal limits. No pneumothorax or pleural effusion is noted. Stable bilateral lung opacities are noted consistent with multifocal pneumonia. The visualized skeletal structures are unremarkable. IMPRESSION: Stable bilateral lung opacities are noted consistent with  multifocal pneumonia. Electronically Signed   By: Lupita Raider M.D.   On: 01/03/2019 10:39   DG CHEST PORT 1 VIEW  Result Date: 01/02/2019 CLINICAL DATA:  68 year old male, COVID-19. Portable chest EXAM: PORTABLE CHEST 1 VIEW COMPARISON:  01/01/2019 and earlier. FINDINGS: Portable AP semi upright view at 0559 hours. The small left pneumothorax visible in the apex yesterday is not evident. There might be a small volume of left apical pleural fluid. Stable lung volumes and mediastinal contours. Widely scattered patchy and indistinct opacity, mostly in the lung periphery and bases has progressed since 12/27/2018, not significantly changed since yesterday. Visualized tracheal air column is within normal limits. No acute osseous abnormality identified. IMPRESSION: 1. No left pneumothorax identified today. 2. Bilateral COVID-19 pneumonia stable since yesterday. Electronically Signed   By: Odessa Fleming M.D.   On: 01/02/2019 08:25   DG CHEST PORT 1 VIEW  Result Date: 01/01/2019 CLINICAL DATA:  COVID-19 positive patient.  Shortness of breath EXAM: PORTABLE CHEST 1 VIEW COMPARISON:  December 27, 2018 FINDINGS: Increasing patchy pulmonary infiltrates are identified, most marked in the bases, right greater than left. Stable cardiomegaly. The hila and mediastinum are normal. There appears to be a  small left apical pneumothorax measuring 11 mm, not seen previously. IMPRESSION: 1. There appears to be a new 11 mm pneumothorax in the left apex. Recommend a PA and lateral xray for further confirmation. 2. Increasing bilateral pulmonary infiltrates. Findings called to the patient's nurse Melissa. Electronically Signed   By: Gerome Sam III M.D   On: 01/01/2019 11:34        Scheduled Meds: . allopurinol  100 mg Oral BID  . amLODipine  5 mg Oral q morning - 10a  . chlorpheniramine-HYDROcodone  5 mL Oral Q12H  . dexamethasone (DECADRON) injection  6 mg Intravenous Q24H  . enoxaparin (LOVENOX) injection  0.5 mg/kg Subcutaneous Q24H  . pantoprazole  40 mg Oral BID  . sertraline  200 mg Oral q morning - 10a  . vitamin C  500 mg Oral Daily  . zinc sulfate  220 mg Oral Daily   Continuous Infusions:    LOS: 8 days     Osvaldo Shipper, MD  Pager: [email or web address]

## 2019-01-04 NOTE — Discharge Planning (Addendum)
Called and s/w son to update on POC and patient current status. Informed that patient is current on RA (at rest)  - But may still need O2 for activity.  Also discussed that we may be getting closer to DC and will keep him updated.  Patient needed 6L [place] to walk in the hall (4 doors).  Son called back to inform and confirmed he can have someone at house to receive O2 delivery when needed.  They are also working on getting another family member to stay with patient for a while.

## 2019-01-04 NOTE — Progress Notes (Signed)
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  He does have significant past medical history for diastolic heart failure, hypertension, TIAs, and chronic kidney disease stage III.  Patient was diagnosed with COVID-19 the day before Thanksgiving, November 25.   Patient was noted to be hypoxic.  He was hospitalized for further management.  Chest x-ray showed left upper lobe  infiltrate. On 12/11, had pnemothorax, now resolved.    PT Comments    The patient recently ambulated with RN with difficulty. Patient concentrated on shorter ambulation. On RA SPO2 83%, On 2 L SPO2 88%. HR is noted to go up in high 110's. Patient expresses concern for going home alone, per case manager the son iis working on patient having assistance. Pt.does decline SNF. Discussed strategies for making shorter trips in house, if on O2, he will need to be very careful of tubing. Patient may benefit from a rollator to have access for sitting when SOB. Patient has had a pneumothorax in last few days which is resolved, per report. Continue PT.  Follow Up Recommendations  Home health PT     Equipment Recommendations  None recommended by PT(maybe a rollator)    Recommendations for Other Services       Precautions / Restrictions Precautions Precaution Comments: desats on RA, pt. reports that he fell down his steps a few months ago-has neuropathy    Mobility  Bed Mobility               General bed mobility comments: in recliner  Transfers   Equipment used: Rolling walker (2 wheeled)   Sit to Stand: Supervision         General transfer comment: needs set up and line management, cues for safety for foot rest  Ambulation/Gait Ambulation/Gait assistance: Supervision   Assistive device: Rolling walker (2 wheeled) Gait Pattern/deviations:  Step-through pattern;Wide base of support     General Gait Details: ambulated x 50' on RA, with SPO2 83%, rested with SPO2 up to 92%. Placed on 2 L(ear probe) with SPO2 88% for 50' ambulation.   Stairs             Wheelchair Mobility    Modified Rankin (Stroke Patients Only)       Balance Overall balance assessment: History of Falls;Mild deficits observed, not formally tested   Sitting balance-Leahy Scale: Good       Standing balance-Leahy Scale: Fair                              Cognition   Behavior During Therapy: Flat affect;Agitated                                   General Comments: about the food, not having any help at home( notes reflect son trying to arrange some assistance), Pt. expresses concern for family contact.      Exercises      General Comments        Pertinent Vitals/Pain Pain Assessment: No/denies pain    Home Living                      Prior Function            PT Goals (current  goals can now be found in the care plan section) Progress towards PT goals: Progressing toward goals    Frequency    Min 3X/week      PT Plan Current plan remains appropriate    Co-evaluation              AM-PAC PT "6 Clicks" Mobility   Outcome Measure  Help needed turning from your back to your side while in a flat bed without using bedrails?: None Help needed moving from lying on your back to sitting on the side of a flat bed without using bedrails?: None Help needed moving to and from a bed to a chair (including a wheelchair)?: None Help needed standing up from a chair using your arms (e.g., wheelchair or bedside chair)?: None Help needed to walk in hospital room?: A Little Help needed climbing 3-5 steps with a railing? : A Lot 6 Click Score: 21    End of Session Equipment Utilized During Treatment: Oxygen Activity Tolerance: Patient tolerated treatment well Patient left: in chair;with call  bell/phone within reach Nurse Communication: Mobility status PT Visit Diagnosis: History of falling (Z91.81)     Time: 1450-1551 PT Time Calculation (min) (ACUTE ONLY): 61 min  Charges:  $Gait Training: 23-37 mins $Self Care/Home Management: [place] Pager [phone] Office [phone]    Claretha Cooper 01/04/2019, 5:01 PM

## 2019-01-04 NOTE — Discharge Planning (Signed)
Patient walked in hall (4 doors), became extremely winded, pale and almost passed out.  Had to sit in chair before resuming.  SPO2 fell to upper 70s, needed 6L to maintain 90 SPO2.  Dr and SW informed.

## 2019-01-04 NOTE — Social Work (Addendum)
Pt was for dc today but became winded and almost passed out while ambulating with RN. Pt agreeable to [facility] and oxygen services and indicates no preferred provider. HHPT/OT arranged with Alvis Lemmings and oxygen with [employer]. Pt's son available to be at pt's home for oxygen delivery and is attempting to arrange private caregivers for pt at home. SW will follow.   Wandra Feinstein, MSW, LCSW [phone] (GV coverage)

## 2019-01-05 ENCOUNTER — Inpatient Hospital Stay (HOSPITAL_COMMUNITY): Payer: Medicare Other

## 2019-01-05 LAB — BASIC METABOLIC PANEL
Anion gap: 10 (ref 5–15)
BUN: 37 mg/dL — ABNORMAL HIGH (ref 8–23)
CO2: 26 mmol/L (ref 22–32)
Calcium: 8.7 mg/dL — ABNORMAL LOW (ref 8.9–10.3)
Chloride: 99 mmol/L (ref 98–111)
Creatinine, Ser: 1.22 mg/dL (ref 0.61–1.24)
GFR calc Af Amer: >60 mL/min (ref >60)
GFR calc non Af Amer: 60 mL/min — ABNORMAL LOW (ref >60)
Glucose, Bld: 101 mg/dL — ABNORMAL HIGH (ref 70–99)
Potassium: 4.6 mmol/L (ref 3.5–5.1)
Sodium: 135 mmol/L (ref 135–145)

## 2019-01-05 NOTE — Progress Notes (Signed)
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  He does have significant past medical history for diastolic heart failure, hypertension, TIAs, and chronic kidney disease stage III.  Patient was diagnosed with COVID-19 the day before Thanksgiving, November 25.   Patient was noted to be hypoxic.  He was hospitalized for further management.  Chest x-ray showed left upper lobe  infiltrate.     Assessment & Plan:   Acute hypoxic respiratory failure due to SARS COVID-19 viral pneumonia.  COVID-19 Labs  Recent Labs  Lab 12/30/18 0440 12/31/18 0520 01/01/19 0435 01/02/19 0246 01/03/19 0710  DDIMER 0.52* 0.60*  --   --   --   FERRITIN 108 113  --   --   --   CRP 5.9* 8.0* 8.0* 4.4* 1.7*  ALT 28 49* 47* 51* 56*    Objective findings: Fever: Remains afebrile Oxygen requirements: At rest patient is requiring about 2 L of oxygen.  Yesterday when he ambulated he dropped into the 70s and almost passed out.  We had to hold his discharge.    COVID 19 Therapeutics: Antibacterials: None Remdesivir: Completed 5-day course on 12/10. Steroids: Dexamethasone is being tapered. Diuretics: Has received Lasix intermittently.  Not in the last 72 hours.  Will give additional dose today. Actemra: Not given yet Convalescent Plasma: Not given yet Vitamin C and Zinc: Continue PUD Prophylaxis: Protonix DVT Prophylaxis:  Lovenox 65 mg daily  Patient's respiratory status is improved.  The main issue now is that he tends to feel very weak and lightheaded when he ambulates due to significant exertional hypoxia.  He is also very deconditioned.  We will have physical therapy work with him intensely for the next day or 2.  Hopefully this will improve and then he can go home.  Patient also given 1 dose of Lasix yesterday.  Chest x-ray showed stable findings from this  morning.  Small pneumothorax Chest x-ray from 12/11 showed a small left apical pneumothorax.  Discussed with pulmonology.  He was placed on nonrebreather for 24 hours.  Chest x-ray repeated and shows resolution of the pneumothorax.     Essential hypertension  Blood pressure is reasonably well controlled.  Continue higher dose of amlodipine.  Lisinopril is on hold.  Can be resumed at discharge.    Depression On sertraline, no confusion or agitation.  Obesity Estimated body mass index is 40.03 kg/m as calculated from the following:   Height as of this encounter: 5\' 10"  (1.778 m).   Weight as of this encounter: 126.6 kg.  Chronic diastolic heart failure Seems to be reasonably well compensated.  He received Lasix intermittently.  CKD stage 3a Renal function is stable.  Continue to monitor urine output.  Mild transaminitis Most likely due to COVID-19.  Outpatient follow-up.  DVT prophylaxis: enoxaparin  Code Status: Full Code Family Communication:  Discussed with patient.  He states that he will update his family members. Disposition Plan: Patient lives by himself.  Home health has been ordered.  However yesterday he desaturated with exertion and almost passed out.  Feels slightly better today but I think he needs to be ambulated few times here in the hospital to make sure he does not desaturate significantly before he can be discharged.  Does not want to consider SNF.     Consultants:   None  Procedures:   None  Antimicrobials:  Anti-infectives (From admission, onward)   Start     Dose/Rate Route Frequency Ordered Stop   12/28/18 1000  remdesivir 100 mg in sodium chloride 0.9 % 100 mL IVPB     100 mg 200 mL/hr over 30 Minutes Intravenous Daily 12/27/18 2235 01/01/19 1010   12/27/18 2300  remdesivir 200 mg in sodium chloride 0.9% 250 mL IVPB     200 mg 580 mL/hr over 30 Minutes Intravenous Once 12/27/18 2235 12/28/18 0131        Subjective: Patient slightly  anxious.  No complaints offered.  Shortness of breath is improving.  Objective: Vitals:   01/04/19 2000 01/05/19 0400 01/05/19 0748 01/05/19 0903  BP: 125/73 (!) 144/82 (!) 143/87 138/82  Pulse: 73 61 68   Resp: 17  18   Temp: 98.3 F (36.8 C) 97.9 F (36.6 C) 97.8 F (36.6 C)   TempSrc: Oral Oral Oral   SpO2: 95% 93% 93%   Weight:      Height:        Intake/Output Summary (Last 24 hours) at 01/05/2019 1316 Last data filed at 01/05/2019 1020 Gross per 24 hour  Intake 540 ml  Output 1200 ml  Net -660 ml   Filed Weights   12/28/18 0900  Weight: 126.6 kg    Examination:   General appearance: Awake alert.  In no distress Resp: Improved air entry bilaterally.  Coarse breath sounds.  Few crackles at the bases.  Cardio: S1-S2 is normal regular.  No S3-S4.  No rubs murmurs or bruit GI: Abdomen is soft.  Nontender nondistended.  Bowel sounds are present normal.  No masses organomegaly Extremities: No edema.  Full range of motion of lower extremities. Neurologic: Alert and oriented x3.  No focal neurological deficits.     Data Reviewed: I have personally reviewed following labs and imaging studies  CBC: Recent Labs  Lab 12/31/18 0520 01/02/19 0246 01/03/19 0710  WBC 10.5 12.4* 14.4*  HGB 13.0 13.6 13.6  HCT 41.4 43.8 43.8  MCV 85.0 85.5 86.1  PLT 438* 587* 258*   Basic Metabolic Panel: Recent Labs  Lab 12/31/18 0520 01/01/19 0435 01/02/19 0246 01/03/19 0710 01/05/19 0525  NA 138 138 137 138 135  K 5.1 4.1 4.7 4.6 4.6  CL 97* 96* 95* 99 99  CO2 28 28 32 30 26  GLUCOSE 90 133* 152* 85 101*  BUN 36* 37* 42* 39* 37*  CREATININE 1.25* 1.20 1.28* 1.21 1.22  CALCIUM 8.4* 8.4* 8.7* 8.7* 8.7*   GFR: Estimated Creatinine Clearance: 75.2 mL/min (by C-G formula based on SCr of 1.22 mg/dL). Liver Function Tests: Recent Labs  Lab 12/30/18 0440 12/31/18 0520 01/01/19 0435 01/02/19 0246 01/03/19 0710  AST 28 50* 30 28 29   ALT 28 49* 47* 51* 56*  ALKPHOS 62 70  68 69 70  BILITOT 0.7 0.9 0.9 1.2 1.0  PROT 6.5 6.1* 6.9 7.1 6.8  ALBUMIN 2.9* 2.8* 3.0* 3.1* 2.9*      Radiology Studies: DG CHEST PORT 1 VIEW  Result Date: 01/05/2019 CLINICAL DATA:  Follow-up pneumonia due to COVID-19 infection. EXAM: PORTABLE CHEST 1 VIEW COMPARISON:  01/03/2019 FINDINGS: Bilateral areas of predominantly peripheral patchy airspace opacities are without change. No new lung abnormalities. No pleural effusion or pneumothorax. IMPRESSION: 1. No change in the bilateral patchy airspace lung opacities consistent multifocal COVID-19 pneumonia. No new abnormalities. Electronically Signed   By: Lajean Manes M.D.   On: 01/05/2019 08:12   DG CHEST PORT 1  VIEW  Result Date: 01/03/2019 CLINICAL DATA:  Pneumothorax. EXAM: PORTABLE CHEST 1 VIEW COMPARISON:  January 02, 2019. FINDINGS: The heart size and mediastinal contours are within normal limits. No pneumothorax or pleural effusion is noted. Stable bilateral lung opacities are noted consistent with multifocal pneumonia. The visualized skeletal structures are unremarkable. IMPRESSION: Stable bilateral lung opacities are noted consistent with multifocal pneumonia. Electronically Signed   By: Lupita Raider M.D.   On: 01/03/2019 10:39   DG CHEST PORT 1 VIEW  Result Date: 01/02/2019 CLINICAL DATA:  68 year old male, COVID-19. Portable chest EXAM: PORTABLE CHEST 1 VIEW COMPARISON:  01/01/2019 and earlier. FINDINGS: Portable AP semi upright view at 0559 hours. The small left pneumothorax visible in the apex yesterday is not evident. There might be a small volume of left apical pleural fluid. Stable lung volumes and mediastinal contours. Widely scattered patchy and indistinct opacity, mostly in the lung periphery and bases has progressed since 12/27/2018, not significantly changed since yesterday. Visualized tracheal air column is within normal limits. No acute osseous abnormality identified. IMPRESSION: 1. No left pneumothorax identified  today. 2. Bilateral COVID-19 pneumonia stable since yesterday. Electronically Signed   By: Odessa Fleming M.D.   On: 01/02/2019 08:25        Scheduled Meds: . allopurinol  100 mg Oral BID  . amLODipine  5 mg Oral q morning - 10a  . chlorpheniramine-HYDROcodone  5 mL Oral Q12H  . dexamethasone (DECADRON) injection  6 mg Intravenous Q24H  . enoxaparin (LOVENOX) injection  0.5 mg/kg Subcutaneous Q24H  . pantoprazole  40 mg Oral BID  . sertraline  200 mg Oral q morning - 10a  . vitamin C  500 mg Oral Daily  . zinc sulfate  220 mg Oral Daily   Continuous Infusions:    LOS: 9 days     Osvaldo Shipper, MD  Pager: [email or web address]

## 2019-01-05 NOTE — Discharge Planning (Signed)
Patient ambulated full length of hall and back.  Started him on 3L - But fell to 82% SPO2. Traveled one way and said he had to stop and sit down before continuing. Increased to 4L Remained in high 80s, but finally reached low 90s at room. Patient was fatigued, but overall did much better than yesterdays trial.  Only took about 5 minutes to recover back to RA.

## 2019-01-05 NOTE — Plan of Care (Signed)
Spoke with son on phone to update on POC and progress of walking the hall.  Understands he may be DC tomorrow, but will re-assess when Dr. Is able to round.  No further questions.

## 2019-01-06 NOTE — Progress Notes (Signed)
SATURATION QUALIFICATIONS: (This note is used to comply with regulatory documentation for home oxygen)  Patient Saturations on Room Air at Rest = 99%  Patient Saturations on Room Air while Ambulating = 87%  Patient Saturations on 2 Liters of oxygen while Ambulating = 94%%  Please briefly explain why patient needs home oxygen:to maintain O2 saturation  Greater than 88% for ambulation.  [place] Pager [phone] Office [phone]

## 2019-01-06 NOTE — Progress Notes (Signed)
Patient discharged today. Reviewed AVS Discharge Instructions with patient. Patient verbalized understanding. Patient given copy of AVS instructions. Also reviewed and had pt sign COVID 19 precautions sheet. IV access removed with catheter tip intact. Pt's daughter in law picked pt up from here in private vehicle.

## 2019-01-06 NOTE — Progress Notes (Signed)
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  He does have significant past medical history for diastolic heart failure, hypertension, TIAs, and chronic kidney disease stage III.  Patient was diagnosed with COVID-19 the day before Thanksgiving, November 25.   Patient was noted to be hypoxic.  He was hospitalized for further management.  Chest x-ray showed left upper lobe  infiltrate. On 12/11, had pnemothorax, now resolved.    PT Comments    The patient is progressing well with ambulation. See saturation walk teat note.  The patient recovers more quickly with SPO2 from high 80" when on 2 L. Patient's HR also  In 109 range with activity. Noted dyspnea has improved.  Patient has neuropathy and can benefit from a 4 wheeled RW for home for safer ambulatiopn as patient lives alone. Family working on assistance at DC.  Follow Up Recommendations  Home health PT     Equipment Recommendations  (4 wheeled-wide)    Recommendations for Other Services       Precautions / Restrictions Precautions Precautions: Fall Precaution Comments: desats on RA, pt. reports that he fell down his steps a few months ago-has neuropathy Restrictions Weight Bearing Restrictions: No    Mobility  Bed Mobility               General bed mobility comments: in recliner  Transfers Overall transfer level: Independent               General transfer comment: stands from recliner without assistance,  Ambulation/Gait Ambulation/Gait assistance: Supervision Gait Distance (Feet): 140 Feet(x 2) Assistive device: 4-wheeled walker Gait Pattern/deviations: Step-through pattern;Wide base of support Gait velocity: decr   General Gait Details: one balance loss  when turning   Stairs             Wheelchair Mobility    Modified Rankin  (Stroke Patients Only)       Balance Overall balance assessment: Mild deficits observed, not formally tested   Sitting balance-Leahy Scale: Good     Standing balance support: No upper extremity supported Standing balance-Leahy Scale: Fair                              Cognition Arousal/Alertness: Awake/alert Behavior During Therapy: Flat affect                                   General Comments: very interested in going home today      Exercises      General Comments        Pertinent Vitals/Pain Pain Assessment: No/denies pain    Home Living                      Prior Function            PT Goals (current goals can now be found in the care plan section) Progress towards PT goals: Progressing toward goals    Frequency    Min 3X/week      PT Plan Current plan remains appropriate    Co-evaluation              AM-PAC PT "6 Clicks" Mobility   Outcome Measure  Help needed turning from your back to your  side while in a flat bed without using bedrails?: None Help needed moving from lying on your back to sitting on the side of a flat bed without using bedrails?: None Help needed moving to and from a bed to a chair (including a wheelchair)?: None Help needed standing up from a chair using your arms (e.g., wheelchair or bedside chair)?: None Help needed to walk in hospital room?: A Little Help needed climbing 3-5 steps with a railing? : A Lot 6 Click Score: 21    End of Session Equipment Utilized During Treatment: Oxygen Activity Tolerance: Patient tolerated treatment well Patient left: in chair;with call bell/phone within reach Nurse Communication: Mobility status PT Visit Diagnosis: History of falling (Z91.81)     Time: 4540-9811 PT Time Calculation (min) (ACUTE ONLY): 48 min  Charges:  $Gait Training: 23-37 mins $Self Care/Home Management: [place] Pager [phone] Office [phone]     Claretha Cooper 01/06/2019, 11:04 AM

## 2019-01-06 NOTE — Discharge Instructions (Signed)
Person Under Monitoring Name: Kurt [name]  Location: [address] [place] [address]   Infection Prevention Recommendations for Individuals Confirmed to have, or Being Evaluated for, 2019 Novel Coronavirus (COVID-19) Infection Who Receive Care at Home  Individuals who are confirmed to have, or are being evaluated for, COVID-19 should follow the prevention steps below until a healthcare provider or local or state health department says they can return to normal activities.  Stay home except to get medical care You should restrict activities outside your home, except for getting medical care. Do not go to work, school, or public areas, and do not use public transportation or taxis.  Call ahead before visiting your doctor Before your medical appointment, call the healthcare provider and tell them that you have, or are being evaluated for, COVID-19 infection. This will help the healthcare providers office take steps to keep other people from getting infected. Ask your healthcare provider to call the local or state health department.  Monitor your symptoms Seek prompt medical attention if your illness is worsening (e.g., difficulty breathing). Before going to your medical appointment, call the healthcare provider and tell them that you have, or are being evaluated for, COVID-19 infection. Ask your healthcare provider to call the local or state health department.  Wear a facemask You should wear a facemask that covers your nose and mouth when you are in the same room with other people and when you visit a healthcare provider. People who live with or visit you should also wear a facemask while they are in the same room with you.  Separate yourself from other people in your home As much as possible, you should stay in a different room from other people in your home. Also, you should use a separate bathroom, if available.  Avoid sharing household items You should not share dishes,  drinking glasses, cups, eating utensils, towels, bedding, or other items with other people in your home. After using these items, you should wash them thoroughly with soap and water.  Cover your coughs and sneezes Cover your mouth and nose with a tissue when you cough or sneeze, or you can cough or sneeze into your sleeve. Throw used tissues in a lined trash can, and immediately wash your hands with soap and water for at least 20 seconds or use an alcohol-based hand rub.  Wash your [employer] your hands often and thoroughly with soap and water for at least 20 seconds. You can use an alcohol-based hand sanitizer if soap and water are not available and if your hands are not visibly dirty. Avoid touching your eyes, nose, and mouth with unwashed hands.   Prevention Steps for Caregivers and Household Members of Individuals Confirmed to have, or Being Evaluated for, COVID-19 Infection Being Cared for in the Home  If you live with, or provide care at home for, a person confirmed to have, or being evaluated for, COVID-19 infection please follow these guidelines to prevent infection:  Follow healthcare providers instructions Make sure that you understand and can help the patient follow any healthcare provider instructions for all care.  Provide for the patients basic needs You should help the patient with basic needs in the home and provide support for getting groceries, prescriptions, and other personal needs.  Monitor the patients symptoms If they are getting sicker, call his or her medical provider and tell them that the patient has, or is being evaluated for, COVID-19 infection. This will help the healthcare providers office  take steps to keep other people from getting infected. Ask the healthcare provider to call the local or state health department.  Limit the number of people who have contact with the patient If possible, have only one caregiver for the patient. Other household  members should stay in another home or place of residence. If this is not possible, they should stay in another room, or be separated from the patient as much as possible. Use a separate bathroom, if available. Restrict visitors who do not have an essential need to be in the home.  Keep older adults, very young children, and other sick people away from the patient Keep older adults, very young children, and those who have compromised immune systems or chronic health conditions away from the patient. This includes people with chronic heart, lung, or kidney conditions, diabetes, and cancer.  Ensure good ventilation Make sure that shared spaces in the home have good air flow, such as from an air conditioner or an opened window, weather permitting.  Wash your hands often Wash your hands often and thoroughly with soap and water for at least 20 seconds. You can use an alcohol based hand sanitizer if soap and water are not available and if your hands are not visibly dirty. Avoid touching your eyes, nose, and mouth with unwashed hands. Use disposable paper towels to dry your hands. If not available, use dedicated cloth towels and replace them when they become wet.  Wear a facemask and gloves Wear a disposable facemask at all times in the room and gloves when you touch or have contact with the patients blood, body fluids, and/or secretions or excretions, such as sweat, saliva, sputum, nasal mucus, vomit, urine, or feces.  Ensure the mask fits over your nose and mouth tightly, and do not touch it during use. Throw out disposable facemasks and gloves after using them. Do not reuse. Wash your hands immediately after removing your facemask and gloves. If your personal clothing becomes contaminated, carefully remove clothing and launder. Wash your hands after handling contaminated clothing. Place all used disposable facemasks, gloves, and other waste in a lined container before disposing them with other  household waste. Remove gloves and wash your hands immediately after handling these items.  Do not share dishes, glasses, or other household items with the patient Avoid sharing household items. You should not share dishes, drinking glasses, cups, eating utensils, towels, bedding, or other items with a patient who is confirmed to have, or being evaluated for, COVID-19 infection. After the person uses these items, you should wash them thoroughly with soap and water.  Wash laundry thoroughly Immediately remove and wash clothes or bedding that have blood, body fluids, and/or secretions or excretions, such as sweat, saliva, sputum, nasal mucus, vomit, urine, or feces, on them. Wear gloves when handling laundry from the patient. Read and follow directions on labels of laundry or clothing items and detergent. In general, wash and dry with the warmest temperatures recommended on the label.  Clean all areas the individual has used often Clean all touchable surfaces, such as counters, tabletops, doorknobs, bathroom fixtures, toilets, phones, keyboards, tablets, and bedside tables, every day. Also, clean any surfaces that may have blood, body fluids, and/or secretions or excretions on them. Wear gloves when cleaning surfaces the patient has come in contact with. Use a diluted bleach solution (e.g., dilute bleach with 1 part bleach and 10 parts water) or a household disinfectant with a label that says EPA-registered for coronaviruses. To make a bleach  solution at home, add 1 tablespoon of bleach to 1 quart (4 cups) of water. For a larger supply, add  cup of bleach to 1 gallon (16 cups) of water. Read labels of cleaning products and follow recommendations provided on product labels. Labels contain instructions for safe and effective use of the cleaning product including precautions you should take when applying the product, such as wearing gloves or eye protection and making sure you have good ventilation  during use of the product. Remove gloves and wash hands immediately after cleaning.  Monitor yourself for signs and symptoms of illness Caregivers and household members are considered close contacts, should monitor their health, and will be asked to limit movement outside of the home to the extent possible. Follow the monitoring steps for close contacts listed on the symptom monitoring form.   ? If you have additional questions, contact your local health department or call the epidemiologist on call at [phone] (available 24/7). ? This guidance is subject to change. For the most up-to-date guidance from CDC, please refer to their website: http://www.martin.com/ COVID-19 is a respiratory infection that is caused by a virus called severe acute respiratory syndrome coronavirus 2 (SARS-CoV-2). The disease is also known as coronavirus disease or novel coronavirus. In some people, the virus may not cause any symptoms. In others, it may cause a serious infection. The infection can get worse quickly and can lead to complications, such as:  Pneumonia, or infection of the lungs.  Acute respiratory distress syndrome or ARDS. This is fluid build-up in the lungs.  Acute respiratory failure. This is a condition in which there is not enough oxygen passing from the lungs to the body.  Sepsis or septic shock. This is a serious bodily reaction to an infection.  Blood clotting problems.  Secondary infections due to bacteria or fungus. The virus that causes COVID-19 is contagious. This means that it can spread from person to person through droplets from coughs and sneezes (respiratory secretions). What are the causes? This illness is caused by a virus. You may catch the virus by:  Breathing in droplets from an infected person's cough or sneeze.  Touching something, like a table or a doorknob, that was exposed to the virus (contaminated) and  then touching your mouth, nose, or eyes. What increases the risk? Risk for infection You are more likely to be infected with this virus if you:  Live in or travel to an area with a COVID-19 outbreak.  Come in contact with a sick person who recently traveled to an area with a COVID-19 outbreak.  Provide care for or live with a person who is infected with COVID-19. Risk for serious illness You are more likely to become seriously ill from the virus if you:  Are 9 years of age or older.  Have a long-term disease that lowers your body's ability to fight infection (immunocompromised).  Live in a nursing home or long-term care facility.  Have a long-term (chronic) disease such as: ? Chronic lung disease, including chronic obstructive pulmonary disease or asthma ? Heart disease. ? Diabetes. ? Chronic kidney disease. ? Liver disease.  Are obese. What are the signs or symptoms? Symptoms of this condition can range from mild to severe. Symptoms may appear any time from 2 to 14 days after being exposed to the virus. They include:  A fever.  A cough.  Difficulty breathing.  Chills.  Muscle pains.  A sore throat.  Loss of taste or smell. Some people may also  have stomach problems, such as nausea, vomiting, or diarrhea. Other people may not have any symptoms of COVID-19. How is this diagnosed? This condition may be diagnosed based on:  Your signs and symptoms, especially if: ? You live in an area with a COVID-19 outbreak. ? You recently traveled to or from an area where the virus is common. ? You provide care for or live with a person who was diagnosed with COVID-19.  A physical exam.  Lab tests, which may include: ? A nasal swab to take a sample of fluid from your nose. ? A throat swab to take a sample of fluid from your throat. ? A sample of mucus from your lungs (sputum). ? Blood tests.  Imaging tests, which may include, X-rays, CT scan, or ultrasound. How is this  treated? At present, there is no medicine to treat COVID-19. Medicines that treat other diseases are being used on a trial basis to see if they are effective against COVID-19. Your health care provider will talk with you about ways to treat your symptoms. For most people, the infection is mild and can be managed at home with rest, fluids, and over-the-counter medicines. Treatment for a serious infection usually takes places in a hospital intensive care unit (ICU). It may include one or more of the following treatments. These treatments are given until your symptoms improve.  Receiving fluids and medicines through an IV.  Supplemental oxygen. Extra oxygen is given through a tube in the nose, a face mask, or a hood.  Positioning you to lie on your stomach (prone position). This makes it easier for oxygen to get into the lungs.  Continuous positive airway pressure (CPAP) or bi-level positive airway pressure (BPAP) machine. This treatment uses mild air pressure to keep the airways open. A tube that is connected to a motor delivers oxygen to the body.  Ventilator. This treatment moves air into and out of the lungs by using a tube that is placed in your windpipe.  Tracheostomy. This is a procedure to create a hole in the neck so that a breathing tube can be inserted.  Extracorporeal membrane oxygenation (ECMO). This procedure gives the lungs a chance to recover by taking over the functions of the heart and lungs. It supplies oxygen to the body and removes carbon dioxide. Follow these instructions at home: Lifestyle  If you are sick, stay home except to get medical care. Your health care provider will tell you how long to stay home. Call your health care provider before you go for medical care.  Rest at home as told by your health care provider.  Do not use any products that contain nicotine or tobacco, such as cigarettes, e-cigarettes, and chewing tobacco. If you need help quitting, ask your  health care provider.  Return to your normal activities as told by your health care provider. Ask your health care provider what activities are safe for you. General instructions  Take over-the-counter and prescription medicines only as told by your health care provider.  Drink enough fluid to keep your urine pale yellow.  Keep all follow-up visits as told by your health care provider. This is important. How is this prevented?  There is no vaccine to help prevent COVID-19 infection. However, there are steps you can take to protect yourself and others from this virus. To protect yourself:   Do not travel to areas where COVID-19 is a risk. The areas where COVID-19 is reported change often. To identify high-risk areas and travel  restrictions, check the CDC travel website: [employer].siwwwnc.cdc.gov/travel/notices  If you live in, or must travel to, an area where COVID-19 is a risk, take precautions to avoid infection. ? Stay away from people who are sick. ? Wash your hands often with soap and water for 20 seconds. If soap and water are not available, use an alcohol-based hand sanitizer. ? Avoid touching your mouth, face, eyes, or nose. ? Avoid going out in public, follow guidance from your state and local health authorities. ? If you must go out in public, wear a cloth face covering or face mask. ? Disinfect objects and surfaces that are frequently touched every day. This may include:  Counters and tables.  Doorknobs and light switches.  Sinks and faucets.  Electronics, such as phones, remote controls, keyboards, computers, and tablets. To protect others: If you have symptoms of COVID-19, take steps to prevent the virus from spreading to others.  If you think you have a COVID-19 infection, contact your health care provider right away. Tell your health care team that you think you may have a COVID-19 infection.  Stay home. Leave your house only to seek medical care. Do not use public  transport.  Do not travel while you are sick.  Wash your hands often with soap and water for 20 seconds. If soap and water are not available, use alcohol-based hand sanitizer.  Stay away from other members of your household. Let healthy household members care for children and pets, if possible. If you have to care for children or pets, wash your hands often and wear a mask. If possible, stay in your own room, separate from others. Use a different bathroom.  Make sure that all people in your household wash their hands well and often.  Cough or sneeze into a tissue or your sleeve or elbow. Do not cough or sneeze into your hand or into the air.  Wear a cloth face covering or face mask. Where to find more information  Centers for Disease Control and Prevention: [employer].tnwww.cdc.gov/coronavirus/2019-ncov/index.html  World Health Organization: https://thompson-craig.com/www.who.int/health-topics/coronavirus Contact a health care provider if:  You live in or have traveled to an area where COVID-19 is a risk and you have symptoms of the infection.  You have had contact with someone who has COVID-19 and you have symptoms of the infection. Get help right away if:  You have trouble breathing.  You have pain or pressure in your chest.  You have confusion.  You have bluish lips and fingernails.  You have difficulty waking from sleep.  You have symptoms that get worse. These symptoms may represent a serious problem that is an emergency. Do not wait to see if the symptoms will go away. Get medical help right away. Call your local emergency services (911 in the [place] Do not drive yourself to the hospital. Let the emergency medical personnel know if you think you have COVID-19. Summary  COVID-19 is a respiratory infection that is caused by a virus. It is also known as coronavirus disease or novel coronavirus. It can cause serious infections, such as pneumonia, acute respiratory distress syndrome, acute respiratory failure, or  sepsis.  The virus that causes COVID-19 is contagious. This means that it can spread from person to person through droplets from coughs and sneezes.  You are more likely to develop a serious illness if you are 70 years of age or older, have a weak immunity, live in a nursing home, or have chronic disease.  There is no medicine to treat  COVID-19. Your health care provider will talk with you about ways to treat your symptoms.  Take steps to protect yourself and others from infection. Wash your hands often and disinfect objects and surfaces that are frequently touched every day. Stay away from people who are sick and wear a mask if you are sick. This information is not intended to replace advice given to you by your health care provider. Make sure you discuss any questions you have with your health care provider. Document Released: 02/12/2018 Document Revised: 06/04/2018 Document Reviewed: 02/12/2018 Elsevier Patient Education  2020 [employer].

## 2019-01-06 NOTE — Discharge Summary (Addendum)
Physician Discharge Summary  Kurt [name] BTD:[id number] DOB: 11-04-1950 DOA: 12/27/2018  PCP: Leonard Downing, MD  Admit date: 12/27/2018 Discharge date: 01/06/2019  Admitted From: Home Disposition: Home   Recommendations for Outpatient Follow-up:  1. Follow up with PCP in 1-2 weeks 2. Please obtain CMP/CBC in one week 3. Recommend repeat ambulatory pulse oximetry at follow up as his oxygen requirements have continued decreasing and he is not expected to require oxygen for too long after discharge.   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 He was found to be hypoxic with LUL infiltrate on CXR, admitted to [facility] and given remdesivir and steroids. Hypoxia slowly improved and, having completed 10 days of steroids and 5 days of remdesivir he is stable for discharge on 12/16.   Discharge Diagnoses:  Principal Problem:   Pneumonia due to COVID-19 virus Active Problems:   Hypertension   Acute respiratory failure with hypoxia (HCC)   CKD (chronic kidney disease) stage 3, GFR 30-59 ml/min   CHF (congestive heart failure) (HCC)  Acute hypoxic respiratory failure due to COVID-19 pneumonia. No hypoxia at rest, down to 2L with exertion. Worked with PT with significant improvement in functional ability, ambulatory tolerance.   Remdesivir: Completed 5-day course on 12/10. Steroids: Dexamethasone tapered, complete taper after DC  No longer feels weak and lightheaded when he ambulates. CXR showing stability.   Small pneumothorax Chest x-ray from 12/11 showed a small left apical pneumothorax.  Discussed with pulmonology.  He was placed on nonrebreather for 24 hours.  Chest x-ray repeated  and shows resolution of the pneumothorax.     Essential hypertension  Blood pressure is reasonably well controlled.  Continue higher dose of amlodipine.  Lisinopril is on hold.  Can be resumed at discharge.    Depression On sertraline, no confusion or agitation.  Obesity Estimated body mass index is 40.03 kg/m as calculated from the following:   Height as of this encounter: 5\' 10"  (1.778 m).   Weight as of this encounter: 126.6 kg.  Chronic diastolic heart failure Seems to be reasonably well compensated.  He received Lasix intermittently.  CKD stage 3a Renal function is stable.  Continue to monitor urine output.  Mild transaminitis Most likely due to COVID-19.  Outpatient follow-up.  Discharge Instructions Discharge Instructions    Call MD for:  difficulty breathing, headache or visual disturbances   Complete by: As directed    Call MD for:  extreme fatigue   Complete by: As directed    Call MD for:  persistant dizziness or light-headedness   Complete by: As directed    Call MD for:  persistant nausea and vomiting   Complete by: As directed    Call MD for:  severe uncontrolled pain   Complete by: As directed    Call MD for:  temperature >100.4   Complete by: As directed    Diet - low sodium heart healthy   Complete by: As directed    Discharge instructions   Complete by: As directed    Benjamin are felt to be stable enough to no longer require inpatient monitoring, testing, and treatment, though you will need to follow the recommendations below: - Based on the CDC's  non-test criteria for ending self-isolation: You may not return to work/leave the home until at least 21 days since symptom onset AND 3 days without a fever (without taking tylenol, ibuprofen, etc.) AND have improvement in respiratory symptoms. - Do not take NSAID medications (including, but not limited to, ibuprofen, advil, motrin, naproxen, aleve, goody's powder, etc.) - Follow up  with your doctor in the next week via telehealth or seek medical attention right away if your symptoms get WORSE.  - Consider donating plasma after you have recovered (either 14 days after a negative test or 28 days after symptoms have completely resolved) because your antibodies to this virus may be helpful to give to others with life-threatening infections. Please go to the website www.oneblood.org if you would like to consider volunteering for plasma donation.    Directions for you at home:  Wear a facemask You should wear a facemask that covers your nose and mouth when you are in the same room with other people and when you visit a healthcare provider. People who live with or visit you should also wear a facemask while they are in the same room with you.  Separate yourself from other people in your home As much as possible, you should stay in a different room from other people in your home. Also, you should use a separate bathroom, if available.  Avoid sharing household items You should not share dishes, drinking glasses, cups, eating utensils, towels, bedding, or other items with other people in your home. After using these items, you should wash them thoroughly with soap and water.  Cover your coughs and sneezes Cover your mouth and nose with a tissue when you cough or sneeze, or you can cough or sneeze into your sleeve. Throw used tissues in a lined trash can, and immediately wash your hands with soap and water for at least 20 seconds or use an alcohol-based hand rub.  Wash your [employer] Corporationhands Wash your hands often and thoroughly with soap and water for at least 20 seconds. You can use an alcohol-based hand sanitizer if soap and water are not available and if your hands are not visibly dirty. Avoid touching your eyes, nose, and mouth with unwashed hands.  Directions for those who live with, or provide care at home for you:  Limit the number of people who have contact with the patient If  possible, have only one caregiver for the patient. Other household members should stay in another home or place of residence. If this is not possible, they should stay in another room, or be separated from the patient as much as possible. Use a separate bathroom, if available. Restrict visitors who do not have an essential need to be in the home.  Ensure good ventilation Make sure that shared spaces in the home have good air flow, such as from an air conditioner or an opened window, weather permitting.  Wash your hands often Wash your hands often and thoroughly with soap and water for at least 20 seconds. You can use an alcohol based hand sanitizer if soap and water are not available and if your hands are not visibly dirty. Avoid touching your eyes, nose, and mouth with unwashed hands. Use disposable paper towels to dry your hands. If not available, use dedicated cloth towels and replace them when they become wet.  Wear a facemask and gloves Wear a disposable facemask at all times in the room and gloves when you touch or have contact with the patient's blood, body  fluids, and/or secretions or excretions, such as sweat, saliva, sputum, nasal mucus, vomit, urine, or feces.  Ensure the mask fits over your nose and mouth tightly, and do not touch it during use. Throw out disposable facemasks and gloves after using them. Do not reuse. Wash your hands immediately after removing your facemask and gloves. If your personal clothing becomes contaminated, carefully remove clothing and launder. Wash your hands after handling contaminated clothing. Place all used disposable facemasks, gloves, and other waste in a lined container before disposing them with other household waste. Remove gloves and wash your hands immediately after handling these items.  Do not share dishes, glasses, or other household items with the patient Avoid sharing household items. You should not share dishes, drinking glasses, cups,  eating utensils, towels, bedding, or other items with a patient who is confirmed to have, or being evaluated for, COVID-19 infection. After the person uses these items, you should wash them thoroughly with soap and water.  Wash laundry thoroughly Immediately remove and wash clothes or bedding that have blood, body fluids, and/or secretions or excretions, such as sweat, saliva, sputum, nasal mucus, vomit, urine, or feces, on them. Wear gloves when handling laundry from the patient. Read and follow directions on labels of laundry or clothing items and detergent. In general, wash and dry with the warmest temperatures recommended on the label.  Clean all areas the individual has used often Clean all touchable surfaces, such as counters, tabletops, doorknobs, bathroom fixtures, toilets, phones, keyboards, tablets, and bedside tables, every day. Also, clean any surfaces that may have blood, body fluids, and/or secretions or excretions on them. Wear gloves when cleaning surfaces the patient has come in contact with. Use a diluted bleach solution (e.g., dilute bleach with 1 part bleach and 10 parts water) or a household disinfectant with a label that says EPA-registered for coronaviruses. To make a bleach solution at home, add 1 tablespoon of bleach to 1 quart (4 cups) of water. For a larger supply, add  cup of bleach to 1 gallon (16 cups) of water. Read labels of cleaning products and follow recommendations provided on product labels. Labels contain instructions for safe and effective use of the cleaning product including precautions you should take when applying the product, such as wearing gloves or eye protection and making sure you have good ventilation during use of the product. Remove gloves and wash hands immediately after cleaning.  Monitor yourself for signs and symptoms of illness Caregivers and household members are considered close contacts, should monitor their health, and will be asked to  limit movement outside of the home to the extent possible. Follow the monitoring steps for close contacts listed on the symptom monitoring form.   If you have additional questions, contact your local health department or call the epidemiologist on call at [phone] (available 24/7). This guidance is subject to change. For the most up-to-date guidance from [facility], please refer to their website: TripMetro.hu   You were cared for by a hospitalist during your hospital stay. If you have any questions about your discharge medications or the care you received while you were in the hospital after you are discharged, you can call the unit and asked to speak with the hospitalist on call if the hospitalist that took care of you is not available. Once you are discharged, your primary care physician will handle any further medical issues. Please note that NO REFILLS for any discharge medications will be authorized once you are discharged, as it is  imperative that you return to your primary care physician (or establish a relationship with a primary care physician if you do not have one) for your aftercare needs so that they can reassess your need for medications and monitor your lab values. If you do not have a primary care physician, you can call [phone] for a physician referral.   Increase activity slowly   Complete by: As directed      Allergies as of 01/06/2019   No Known Allergies     Medication List    STOP taking these medications   amoxicillin-clavulanate 875-125 MG tablet Commonly known as: AUGMENTIN   OVER THE COUNTER MEDICATION   predniSONE 20 MG tablet Commonly known as: DELTASONE     TAKE these medications   acetaminophen 500 MG tablet Commonly known as: TYLENOL Take 1,000 mg by mouth every 4 (four) hours as needed for headache (pain).   Acetaminophen-Codeine 300-30 MG tablet Take 1-2 tablets by mouth every 4 (four)  hours as needed for pain (max 8 tabs/24 hours).   albuterol 108 (90 Base) MCG/ACT inhaler Commonly known as: VENTOLIN HFA Inhale 2 puffs into the lungs every 4 (four) hours as needed for wheezing or shortness of breath.   allopurinol 100 MG tablet Commonly known as: ZYLOPRIM Take 100 mg by mouth 2 (two) times a day.   amLODipine 2.5 MG tablet Commonly known as: NORVASC Take 2.5 mg by mouth every morning.   benzonatate 100 MG capsule Commonly known as: Tessalon Perles Take 1 capsule (100 mg total) by mouth 3 (three) times daily as needed for cough.   dexamethasone 2 MG tablet Commonly known as: DECADRON Take 2 tablets once daily for 3 days, then 1 tablet once daily for 3 days, then STOP.   ferrous sulfate 324 MG Tbec Take 324 mg by mouth daily with breakfast.   furosemide 80 MG tablet Commonly known as: LASIX Take 40 mg by mouth 2 (two) times daily.   lisinopril 20 MG tablet Commonly known as: ZESTRIL Take 40 mg by mouth every morning.   pantoprazole 40 MG tablet Commonly known as: PROTONIX Take 1 tablet (40 mg total) by mouth 2 (two) times daily for 30 days.   sertraline 100 MG tablet Commonly known as: ZOLOFT Take 1 tablet (100 mg total) by mouth daily. What changed:   how much to take  when to take this            Durable Medical Equipment  (From admission, onward)         Start     Ordered   01/04/19 1014  For home use only DME oxygen  Once    Question Answer Comment  Length of Need 6 Months   Mode or (Route) Nasal cannula   Liters per Minute 2   Frequency Continuous (stationary and portable oxygen unit needed)   Oxygen conserving device Yes   Oxygen delivery system Gas      01/04/19 1015         Follow-up Information    Kaleen Mask, MD. Schedule an appointment as soon as possible for a visit in 2 week(s).   Specialty: Family Medicine Contact information: [address] [place] [address] [phone]           No Known Allergies  Consultations:  Pulmonology curbside  Procedures/Studies: DG CHEST PORT 1 VIEW  Result Date: 01/05/2019 CLINICAL DATA:  Follow-up pneumonia due to COVID-19 infection. EXAM: PORTABLE CHEST 1 VIEW COMPARISON:  01/03/2019 FINDINGS:  Bilateral areas of predominantly peripheral patchy airspace opacities are without change. No new lung abnormalities. No pleural effusion or pneumothorax. IMPRESSION: 1. No change in the bilateral patchy airspace lung opacities consistent multifocal COVID-19 pneumonia. No new abnormalities. Electronically Signed   By: Amie Portland M.D.   On: 01/05/2019 08:12   DG CHEST PORT 1 VIEW  Result Date: 01/03/2019 CLINICAL DATA:  Pneumothorax. EXAM: PORTABLE CHEST 1 VIEW COMPARISON:  January 02, 2019. FINDINGS: The heart size and mediastinal contours are within normal limits. No pneumothorax or pleural effusion is noted. Stable bilateral lung opacities are noted consistent with multifocal pneumonia. The visualized skeletal structures are unremarkable. IMPRESSION: Stable bilateral lung opacities are noted consistent with multifocal pneumonia. Electronically Signed   By: Lupita Raider M.D.   On: 01/03/2019 10:39   DG CHEST PORT 1 VIEW  Result Date: 01/02/2019 CLINICAL DATA:  68 year old male, COVID-19. Portable chest EXAM: PORTABLE CHEST 1 VIEW COMPARISON:  01/01/2019 and earlier. FINDINGS: Portable AP semi upright view at 0559 hours. The small left pneumothorax visible in the apex yesterday is not evident. There might be a small volume of left apical pleural fluid. Stable lung volumes and mediastinal contours. Widely scattered patchy and indistinct opacity, mostly in the lung periphery and bases has progressed since 12/27/2018, not significantly changed since yesterday. Visualized tracheal air column is within normal limits. No acute osseous abnormality identified. IMPRESSION: 1. No left pneumothorax identified today. 2. Bilateral COVID-19 pneumonia  stable since yesterday. Electronically Signed   By: Odessa Fleming M.D.   On: 01/02/2019 08:25   DG CHEST PORT 1 VIEW  Result Date: 01/01/2019 CLINICAL DATA:  COVID-19 positive patient.  Shortness of breath EXAM: PORTABLE CHEST 1 VIEW COMPARISON:  December 27, 2018 FINDINGS: Increasing patchy pulmonary infiltrates are identified, most marked in the bases, right greater than left. Stable cardiomegaly. The hila and mediastinum are normal. There appears to be a small left apical pneumothorax measuring 11 mm, not seen previously. IMPRESSION: 1. There appears to be a new 11 mm pneumothorax in the left apex. Recommend a PA and lateral xray for further confirmation. 2. Increasing bilateral pulmonary infiltrates. Findings called to the patient's nurse Melissa. Electronically Signed   By: Gerome Sam III M.D   On: 01/01/2019 11:34   DG Chest Portable 1 View  Result Date: 12/27/2018 CLINICAL DATA:  Tested positive for COVID-19 last week, worsening shortness of breath, productive cough, fatigue, fever, hypoxemia, history hypertension EXAM: PORTABLE CHEST 1 VIEW COMPARISON:  Portable exam 2003 hours compared to 06/15/2018 FINDINGS: Upper normal heart size. Mediastinal contours and pulmonary vascularity normal. Mild LEFT perihilar infiltrate question pneumonia. Remaining lungs clear. No pleural effusion or pneumothorax. No acute osseous findings. IMPRESSION: LEFT perihilar infiltrate question pneumonia. Electronically Signed   By: Ulyses Southward M.D.   On: 12/27/2018 20:36   Subjective: Feels much better. Walked with PT this morning felt no lightheadedness, some shortness of breath but much improved from prior and resolved with supplemental oxygen. No chest pain, abd pain, or other complaints. Wants to go home.  Discharge Exam: Vitals:   01/06/19 0600 01/06/19 0810  BP: 138/87 121/76  Pulse: 63 89  Resp: 18 16  Temp: 97.8 F (36.6 C) 98.1 F (36.7 C)  SpO2: 93% 90%   General: Pt is alert, awake, not in acute  distress Cardiovascular: RRR, S1/S2 +, no rubs, no gallops Respiratory: Nonlabored and clear on room air at rest in chair. Abdominal: Soft, NT, ND, bowel sounds + Extremities: Trace edema,  no cyanosis  Labs: BNP (last 3 results) No results for input(s): BNP in the last 8760 hours. Basic Metabolic Panel: Recent Labs  Lab 12/31/18 0520 01/01/19 0435 01/02/19 0246 01/03/19 0710 01/05/19 0525  NA 138 138 137 138 135  K 5.1 4.1 4.7 4.6 4.6  CL 97* 96* 95* 99 99  CO2 28 28 32 30 26  GLUCOSE 90 133* 152* 85 101*  BUN 36* 37* 42* 39* 37*  CREATININE 1.25* 1.20 1.28* 1.21 1.22  CALCIUM 8.4* 8.4* 8.7* 8.7* 8.7*   Liver Function Tests: Recent Labs  Lab 12/31/18 0520 01/01/19 0435 01/02/19 0246 01/03/19 0710  AST 50* ALT 49* 47* 51* 56*  ALKPHOS 70 68 69 70  BILITOT 0.9 0.9 1.2 1.0  PROT 6.1* 6.9 7.1 6.8  ALBUMIN 2.8* 3.0* 3.1* 2.9*   No results for input(s): LIPASE, AMYLASE in the last 168 hours. No results for input(s): AMMONIA in the last 168 hours. CBC: Recent Labs  Lab 12/31/18 0520 01/02/19 0246 01/03/19 0710  WBC 10.5 12.4* 14.4*  HGB 13.0 13.6 13.6  HCT 41.4 43.8 43.8  MCV 85.0 85.5 86.1  PLT 438* 587* 645*   Cardiac Enzymes: No results for input(s): CKTOTAL, CKMB, CKMBINDEX, TROPONINI in the last 168 hours. BNP: Invalid input(s): POCBNP CBG: No results for input(s): GLUCAP in the last 168 hours. D-Dimer No results for input(s): DDIMER in the last 72 hours. Hgb A1c No results for input(s): HGBA1C in the last 72 hours. Lipid Profile No results for input(s): CHOL, HDL, LDLCALC, TRIG, CHOLHDL, LDLDIRECT in the last 72 hours. Thyroid function studies No results for input(s): TSH, T4TOTAL, T3FREE, THYROIDAB in the last 72 hours.  Invalid input(s): FREET3 Anemia work up No results for input(s): VITAMINB12, FOLATE, FERRITIN, TIBC, IRON, RETICCTPCT in the last 72 hours. Urinalysis    Component Value Date/Time   COLORURINE YELLOW 06/15/2018 0350    APPEARANCEUR CLEAR 06/15/2018 0350   LABSPEC 1.014 06/15/2018 0350   PHURINE 5.0 06/15/2018 0350   GLUCOSEU NEGATIVE 06/15/2018 0350   HGBUR NEGATIVE 06/15/2018 0350   BILIRUBINUR NEGATIVE 06/15/2018 0350   KETONESUR NEGATIVE 06/15/2018 0350   PROTEINUR NEGATIVE 06/15/2018 0350   UROBILINOGEN 1.0 07/09/2012 0010   NITRITE NEGATIVE 06/15/2018 0350   LEUKOCYTESUR NEGATIVE 06/15/2018 0350    Microbiology No results found for this or any previous visit (from the past 240 hour(s)).  Time coordinating discharge: Approximately 40 minutes  Tyrone Nine, MD  [facility] 01/06/2019, 10:38 AM

## 2019-01-18 ENCOUNTER — Encounter (HOSPITAL_COMMUNITY): Payer: Self-pay | Admitting: Internal Medicine

## 2019-01-18 ENCOUNTER — Inpatient Hospital Stay (HOSPITAL_COMMUNITY)
Admission: EM | Admit: 2019-01-18 | Discharge: 2019-01-23 | DRG: 189 | Disposition: A | Discharge status: Home Health | Payer: Medicare Other | Attending: Internal Medicine | Admitting: Internal Medicine

## 2019-01-18 ENCOUNTER — Other Ambulatory Visit: Payer: Self-pay

## 2019-01-18 ENCOUNTER — Emergency Department (HOSPITAL_COMMUNITY): Payer: Medicare Other

## 2019-01-18 DIAGNOSIS — Z9981 Dependence on supplemental oxygen: Secondary | ICD-10-CM

## 2019-01-18 DIAGNOSIS — E65 Localized adiposity: Secondary | ICD-10-CM | POA: Diagnosis not present

## 2019-01-18 DIAGNOSIS — I959 Hypotension, unspecified: Secondary | ICD-10-CM | POA: Diagnosis present

## 2019-01-18 DIAGNOSIS — J1282 Pneumonia due to coronavirus disease 2019: Secondary | ICD-10-CM | POA: Diagnosis present

## 2019-01-18 DIAGNOSIS — J1289 Other viral pneumonia: Secondary | ICD-10-CM | POA: Diagnosis not present

## 2019-01-18 DIAGNOSIS — J9601 Acute respiratory failure with hypoxia: Secondary | ICD-10-CM | POA: Diagnosis present

## 2019-01-18 DIAGNOSIS — G4733 Obstructive sleep apnea (adult) (pediatric): Secondary | ICD-10-CM | POA: Diagnosis present

## 2019-01-18 DIAGNOSIS — J329 Chronic sinusitis, unspecified: Secondary | ICD-10-CM | POA: Diagnosis present

## 2019-01-18 DIAGNOSIS — J9621 Acute and chronic respiratory failure with hypoxia: Principal | ICD-10-CM | POA: Diagnosis present

## 2019-01-18 DIAGNOSIS — J320 Chronic maxillary sinusitis: Secondary | ICD-10-CM | POA: Diagnosis not present

## 2019-01-18 DIAGNOSIS — I130 Hypertensive heart and chronic kidney disease with heart failure and stage 1 through stage 4 chronic kidney disease, or unspecified chronic kidney disease: Secondary | ICD-10-CM | POA: Diagnosis present

## 2019-01-18 DIAGNOSIS — U071 COVID-19: Secondary | ICD-10-CM | POA: Diagnosis present

## 2019-01-18 DIAGNOSIS — I2699 Other pulmonary embolism without acute cor pulmonale: Secondary | ICD-10-CM | POA: Diagnosis present

## 2019-01-18 DIAGNOSIS — Z8673 Personal history of transient ischemic attack (TIA), and cerebral infarction without residual deficits: Secondary | ICD-10-CM | POA: Diagnosis not present

## 2019-01-18 DIAGNOSIS — J9620 Acute and chronic respiratory failure, unspecified whether with hypoxia or hypercapnia: Secondary | ICD-10-CM | POA: Diagnosis present

## 2019-01-18 DIAGNOSIS — I509 Heart failure, unspecified: Secondary | ICD-10-CM | POA: Diagnosis not present

## 2019-01-18 DIAGNOSIS — N1831 Chronic kidney disease, stage 3a: Secondary | ICD-10-CM | POA: Diagnosis present

## 2019-01-18 DIAGNOSIS — G8929 Other chronic pain: Secondary | ICD-10-CM | POA: Diagnosis present

## 2019-01-18 DIAGNOSIS — E66812 Obesity, class 2: Secondary | ICD-10-CM | POA: Diagnosis present

## 2019-01-18 DIAGNOSIS — N179 Acute kidney failure, unspecified: Secondary | ICD-10-CM | POA: Diagnosis present

## 2019-01-18 DIAGNOSIS — I10 Essential (primary) hypertension: Secondary | ICD-10-CM | POA: Diagnosis not present

## 2019-01-18 DIAGNOSIS — F4310 Post-traumatic stress disorder, unspecified: Secondary | ICD-10-CM | POA: Diagnosis present

## 2019-01-18 DIAGNOSIS — N178 Other acute kidney failure: Secondary | ICD-10-CM | POA: Diagnosis not present

## 2019-01-18 DIAGNOSIS — M549 Dorsalgia, unspecified: Secondary | ICD-10-CM | POA: Diagnosis present

## 2019-01-18 DIAGNOSIS — J069 Acute upper respiratory infection, unspecified: Secondary | ICD-10-CM

## 2019-01-18 DIAGNOSIS — Z8616 Personal history of COVID-19: Secondary | ICD-10-CM | POA: Diagnosis present

## 2019-01-18 DIAGNOSIS — R627 Adult failure to thrive: Secondary | ICD-10-CM | POA: Diagnosis present

## 2019-01-18 DIAGNOSIS — N189 Chronic kidney disease, unspecified: Secondary | ICD-10-CM | POA: Diagnosis not present

## 2019-01-18 DIAGNOSIS — Z6841 Body Mass Index (BMI) 40.0 and over, adult: Secondary | ICD-10-CM

## 2019-01-18 DIAGNOSIS — F329 Major depressive disorder, single episode, unspecified: Secondary | ICD-10-CM | POA: Diagnosis present

## 2019-01-18 DIAGNOSIS — Z79899 Other long term (current) drug therapy: Secondary | ICD-10-CM

## 2019-01-18 DIAGNOSIS — E669 Obesity, unspecified: Secondary | ICD-10-CM | POA: Diagnosis present

## 2019-01-18 DIAGNOSIS — N183 Chronic kidney disease, stage 3 unspecified: Secondary | ICD-10-CM | POA: Diagnosis present

## 2019-01-18 DIAGNOSIS — B948 Sequelae of other specified infectious and parasitic diseases: Secondary | ICD-10-CM | POA: Diagnosis not present

## 2019-01-18 HISTORY — DX: Acute and chronic respiratory failure, unspecified whether with hypoxia or hypercapnia: J96.20

## 2019-01-18 LAB — CBC WITH DIFFERENTIAL/PLATELET
Abs Immature Granulocytes: 0.05 10*3/uL (ref 0.00–0.07)
Basophils Absolute: 0.1 10*3/uL (ref 0.0–0.1)
Basophils Relative: 1 %
Eosinophils Absolute: 0.2 10*3/uL (ref 0.0–0.5)
Eosinophils Relative: 2 %
HCT: 41.5 % (ref 39.0–52.0)
Hemoglobin: 13.1 g/dL (ref 13.0–17.0)
Immature Granulocytes: 1 %
Lymphocytes Relative: 8 %
Lymphs Abs: 0.8 10*3/uL (ref 0.7–4.0)
MCH: 27.5 pg (ref 26.0–34.0)
MCHC: 31.6 g/dL (ref 30.0–36.0)
MCV: 87 fL (ref 80.0–100.0)
Monocytes Absolute: 0.7 10*3/uL (ref 0.1–1.0)
Monocytes Relative: 7 %
Neutro Abs: 8.8 10*3/uL — ABNORMAL HIGH (ref 1.7–7.7)
Neutrophils Relative %: 81 %
Platelets: 221 10*3/uL (ref 150–400)
RBC: 4.77 MIL/uL (ref 4.22–5.81)
RDW: 14.9 % (ref 11.5–15.5)
WBC: 10.6 10*3/uL — ABNORMAL HIGH (ref 4.0–10.5)
nRBC: 0 % (ref 0.0–0.2)

## 2019-01-18 LAB — LACTIC ACID, PLASMA
Lactic Acid, Venous: 2 mmol/L (ref 0.5–1.9)
Lactic Acid, Venous: 1.3 mmol/L (ref 0.5–1.9)

## 2019-01-18 LAB — D-DIMER, QUANTITATIVE: D-Dimer, Quant: 0.47 ug/mL-FEU (ref 0.00–0.50)

## 2019-01-18 LAB — FERRITIN: Ferritin: 131 ng/mL (ref 24–336)

## 2019-01-18 LAB — SARS CORONAVIRUS 2 (TAT 6-24 HRS): SARS Coronavirus 2: POSITIVE — AB

## 2019-01-18 LAB — BASIC METABOLIC PANEL
Anion gap: 14 (ref 5–15)
BUN: 25 mg/dL — ABNORMAL HIGH (ref 8–23)
CO2: 26 mmol/L (ref 22–32)
Calcium: 8.9 mg/dL (ref 8.9–10.3)
Chloride: 99 mmol/L (ref 98–111)
Creatinine, Ser: 2.06 mg/dL — ABNORMAL HIGH (ref 0.61–1.24)
GFR calc Af Amer: 37 mL/min — ABNORMAL LOW (ref >60)
GFR calc non Af Amer: 32 mL/min — ABNORMAL LOW (ref >60)
Glucose, Bld: 121 mg/dL — ABNORMAL HIGH (ref 70–99)
Potassium: 3.6 mmol/L (ref 3.5–5.1)
Sodium: 139 mmol/L (ref 135–145)

## 2019-01-18 LAB — TRIGLYCERIDES: Triglycerides: 173 mg/dL — ABNORMAL HIGH (ref <150)

## 2019-01-18 LAB — FIBRINOGEN: Fibrinogen: 671 mg/dL — ABNORMAL HIGH (ref 210–475)

## 2019-01-18 LAB — BRAIN NATRIURETIC PEPTIDE: B Natriuretic Peptide: 20.8 pg/mL (ref 0.0–100.0)

## 2019-01-18 LAB — C-REACTIVE PROTEIN: CRP: 3.3 mg/dL — ABNORMAL HIGH (ref <1.0)

## 2019-01-18 LAB — PROCALCITONIN: Procalcitonin: <0.1 ng/mL

## 2019-01-18 LAB — LACTATE DEHYDROGENASE: LDH: 146 U/L (ref 98–192)

## 2019-01-18 MED ORDER — ALBUTEROL SULFATE HFA 108 (90 BASE) MCG/ACT IN AERS
2.0000 | INHALATION_SPRAY | RESPIRATORY_TRACT | Status: DC | PRN
  Filled 2019-01-18: qty 6.7

## 2019-01-18 MED ORDER — BISACODYL 5 MG PO TBEC: 5.0000 mg | DELAYED_RELEASE_TABLET | Freq: Every day | ORAL | Status: DC | PRN

## 2019-01-18 MED ORDER — SODIUM CHLORIDE 0.9 % IV BOLUS
1000.0000 mL | Freq: Once | INTRAVENOUS | Status: AC
  Administered 2019-01-18: 1000 mL via INTRAVENOUS

## 2019-01-18 MED ORDER — SODIUM CHLORIDE 0.9% FLUSH: 3.0000 mL | INTRAVENOUS | Status: DC | PRN

## 2019-01-18 MED ORDER — HEPARIN (PORCINE) 25000 UT/250ML-% IV SOLN
1600.0000 [IU]/h | INTRAVENOUS | Status: DC
  Administered 2019-01-18 – 2019-01-19 (×2): 1600 [IU]/h via INTRAVENOUS
  Filled 2019-01-18 (×2): qty 250

## 2019-01-18 MED ORDER — ACETAMINOPHEN-CODEINE #3 300-30 MG PO TABS
1.0000 | ORAL_TABLET | ORAL | Status: DC | PRN
  Administered 2019-01-19 – 2019-01-20 (×2): 2 via ORAL
  Administered 2019-01-20: 1 via ORAL
  Administered 2019-01-20: 2 via ORAL
  Administered 2019-01-20 – 2019-01-22 (×2): 1 via ORAL
  Filled 2019-01-18: qty 1
  Filled 2019-01-18: qty 2
  Filled 2019-01-18: qty 1
  Filled 2019-01-18: qty 2
  Filled 2019-01-18 (×3): qty 1

## 2019-01-18 MED ORDER — SODIUM CHLORIDE 0.9 % IV SOLN: 250.0000 mL | INTRAVENOUS | Status: DC | PRN

## 2019-01-18 MED ORDER — ONDANSETRON HCL 4 MG PO TABS: 4.0000 mg | ORAL_TABLET | Freq: Four times a day (QID) | ORAL | Status: DC | PRN

## 2019-01-18 MED ORDER — ALLOPURINOL 100 MG PO TABS
100.0000 mg | ORAL_TABLET | Freq: Two times a day (BID) | ORAL | Status: DC
  Administered 2019-01-18 – 2019-01-23 (×10): 100 mg via ORAL
  Filled 2019-01-18 (×12): qty 1

## 2019-01-18 MED ORDER — ONDANSETRON HCL 4 MG/2ML IJ SOLN: 4.0000 mg | Freq: Four times a day (QID) | INTRAMUSCULAR | Status: DC | PRN

## 2019-01-18 MED ORDER — SERTRALINE HCL 50 MG PO TABS
200.0000 mg | ORAL_TABLET | Freq: Every morning | ORAL | Status: DC
  Administered 2019-01-19 – 2019-01-23 (×5): 200 mg via ORAL
  Filled 2019-01-18: qty 2
  Filled 2019-01-18 (×5): qty 4

## 2019-01-18 MED ORDER — FLEET ENEMA 7-19 GM/118ML RE ENEM: 1.0000 | ENEMA | Freq: Once | RECTAL | Status: DC | PRN

## 2019-01-18 MED ORDER — SODIUM CHLORIDE 0.9% FLUSH
3.0000 mL | Freq: Two times a day (BID) | INTRAVENOUS | Status: DC
  Administered 2019-01-18 – 2019-01-23 (×10): 3 mL via INTRAVENOUS

## 2019-01-18 MED ORDER — PANTOPRAZOLE SODIUM 40 MG PO TBEC
40.0000 mg | DELAYED_RELEASE_TABLET | Freq: Two times a day (BID) | ORAL | Status: DC
  Administered 2019-01-18 – 2019-01-23 (×10): 40 mg via ORAL
  Filled 2019-01-18 (×10): qty 1

## 2019-01-18 MED ORDER — GUAIFENESIN-DM 100-10 MG/5ML PO SYRP: 10.0000 mL | ORAL_SOLUTION | ORAL | Status: DC | PRN

## 2019-01-18 MED ORDER — HEPARIN BOLUS VIA INFUSION
5500.0000 [IU] | Freq: Once | INTRAVENOUS | Status: AC
  Administered 2019-01-18: 5500 [IU] via INTRAVENOUS
  Filled 2019-01-18: qty 5500

## 2019-01-18 MED ORDER — ACETAMINOPHEN 325 MG PO TABS
650.0000 mg | ORAL_TABLET | Freq: Four times a day (QID) | ORAL | Status: DC | PRN
  Filled 2019-01-18: qty 2

## 2019-01-18 MED ORDER — BENZONATATE 100 MG PO CAPS: 100.0000 mg | ORAL_CAPSULE | Freq: Three times a day (TID) | ORAL | Status: DC | PRN

## 2019-01-18 MED ORDER — OXYCODONE HCL 5 MG PO TABS
5.0000 mg | ORAL_TABLET | ORAL | Status: DC | PRN
  Administered 2019-01-20 – 2019-01-22 (×5): 5 mg via ORAL
  Filled 2019-01-18 (×5): qty 1

## 2019-01-18 MED ORDER — HYDROCOD POLST-CPM POLST ER 10-8 MG/5ML PO SUER: 5.0000 mL | Freq: Two times a day (BID) | ORAL | Status: DC | PRN

## 2019-01-18 MED ORDER — LACTATED RINGERS IV SOLN: INTRAVENOUS | Status: DC

## 2019-01-18 MED ORDER — POLYETHYLENE GLYCOL 3350 17 G PO PACK: 17.0000 g | PACK | Freq: Every day | ORAL | Status: DC | PRN

## 2019-01-18 NOTE — Progress Notes (Signed)
ANTICOAGULATION CONSULT NOTE - Initial Consult  Pharmacy Consult for heparin Indication: r/o VTE  No Known Allergies  Patient Measurements: Height: 5\' 10"  (177.8 cm) Weight: 279 lb (126.6 kg) IBW/kg (Calculated) : 73 Heparin Dosing Weight: 101.8kg  Vital Signs: Temp: 98.3 F (36.8 C) (12/28 1443) Temp Source: Oral (12/28 1443) BP: 119/86 (12/28 1443) Pulse Rate: 112 (12/28 1443)  Labs: Recent Labs    01/18/19 1215  HGB 13.1  HCT 41.5  PLT 221  CREATININE 2.06*    Estimated Creatinine Clearance: 44.6 mL/min (A) (by C-G formula based on SCr of 2.06 mg/dL (H)).   Medical History: Past Medical History:  Diagnosis Date  . Arthritis   . CHF (congestive heart failure) (HCC)    diastolic dysfunction  . Chronic back pain   . COVID-19   . Depression   . Diverticulitis   . GI bleed 05/2018  . Hypertension   . PTSD (post-traumatic stress disorder)    after  a Fall   . Renal insufficiency    Decrease in GFR- PCP removed all NSAIDS & aspirin     Medications:  Infusions:  . sodium chloride    . heparin    . lactated ringers      Assessment: 45 yom presented with SOB after recent COVID-19 infection. CBC and d-dimer are WNL. To start IV heparin for r/o VTE. He is not on anticoagulation PTA.   Goal of Therapy:  Heparin level 0.3-0.7 units/ml Monitor platelets by anticoagulation protocol: Yes   Plan:  Heparin bolus 5500 units IV x 1 Heparin gtt 1600 units/hr Check an 8 hr heparin level Daily heparin level and CBC F/u imaging   Vyom Brass, Rande Lawman 01/18/2019,5:01 PM

## 2019-01-18 NOTE — ED Provider Notes (Signed)
[facility] Note   CSN: [id number] Arrival date & time: 01/18/19  1127     History No chief complaint on file.   Kurt [name] is a 68 y.o. male.  The history is provided by the patient and medical records. No language interpreter was used.       68 year old male with history of CHF, stage III chronic kidney disease, hypertension, TIA, previously diagnosed with COVID-19 on 11/25 requiring hospitalization and discharged on 12/16 presenting to the ED today with shortness of breath.  Patient reports since being discharged, he still endorse having shortness of breath.  Shortness of breath is becoming progressively worse which is improving been on home oxygen at 2 L.  He endorsed occasional nonproductive cough.  He endorsed generalized fatigue.  Increase shortness of breath with exertion.  He denies having active fever or loss of taste or smell nausea vomiting or diarrhea.  Due to lack of improvement of his breathing patient was seen by his PCP today.  Patient was found to be hypoxic with O2 sat in the 70s on room air and was sent here for further evaluation.  No prior history of PE or DVT.  No Urinary symptom  Past Medical History:  Diagnosis Date   Arthritis    CHF (congestive heart failure) (HCC)    diastolic dysfunction   Chronic back pain    COVID-19    Depression    Diverticulitis    GI bleed 05/2018   Hypertension    PTSD (post-traumatic stress disorder)    after  a Fall    Renal insufficiency    Decrease in GFR- PCP removed all NSAIDS & aspirin    TIA (transient ischemic attack) 2014   patient denies states was [place]    Patient Active Problem List   Diagnosis Date Noted   Pneumonia due to COVID-19 virus 12/27/2018   Acute respiratory failure with hypoxia (HCC) 12/27/2018   CKD (chronic kidney disease) stage 3, GFR 30-59 ml/min 12/27/2018   CHF (congestive heart failure) (HCC) 12/27/2018   QT  prolongation 06/15/2018   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   Spinal stenosis, thoracic 02/16/2015   Thoracic stenosis 02/16/2015   GI bleed 04/29/2014   TIA (transient ischemic attack) 07/08/2012   Diverticulitis 07/08/2012   Hypertension 07/08/2012    Past Surgical History:  Procedure Laterality Date   BIOPSY  06/16/2018   Procedure: BIOPSY;  Surgeon: Hilarie FredricksonPerry, John N, MD;  [facility];  Service: Endoscopy;;   ELBOW FRACTURE SURGERY     ORIF, Shoulder manipulation     ESOPHAGOGASTRODUODENOSCOPY N/A 04/30/2014   Procedure: ESOPHAGOGASTRODUODENOSCOPY (EGD);  Surgeon: Jeani HawkingPatrick Hung, MD;  [facility];  Service: Endoscopy;  Laterality: N/A;   ESOPHAGOGASTRODUODENOSCOPY (EGD) WITH PROPOFOL N/A 06/16/2018   Procedure: ESOPHAGOGASTRODUODENOSCOPY (EGD) WITH PROPOFOL;  Surgeon: Hilarie FredricksonPerry, John N, MD;  [facility];  Service: Endoscopy;  Laterality: N/A;   HEMOSTASIS CLIP PLACEMENT  06/16/2018   Procedure: HEMOSTASIS CLIP PLACEMENT;  Surgeon: Hilarie FredricksonPerry, John N, MD;  [facility];  Service: Endoscopy;;   LUMBAR LAMINECTOMY/DECOMPRESSION MICRODISCECTOMY N/A 02/16/2015   Procedure: Thoracic ten - thoracic twelve laminectomy;  Surgeon: Julio SicksHenry Pool, MD;  Location: [facility] NEURO ORS;  Service: Neurosurgery;  Laterality: N/A;   LUMBAR LAMINECTOMY/DECOMPRESSION MICRODISCECTOMY N/A 03/22/2016   Procedure: Lumbar One-Two, Lumbar Two-Three, Lumbar Three-Four Laminectomy and Foraminotomy;  Surgeon: Julio SicksHenry Pool, MD;  [facility];  Service: Neurosurgery;  Laterality: N/A;   NASAL SEPTUM SURGERY  SCLEROTHERAPY  06/16/2018   Procedure: SCLEROTHERAPY;  Surgeon: Hilarie Fredrickson, MD;  Location: [facility] ENDOSCOPY;  Service: Endoscopy;;       No family history on file.  Social History   Tobacco Use   Smoking status: Never Smoker   Smokeless tobacco: Never Used  Substance Use Topics   Alcohol use: Not Currently    Comment: quit in 1989    Drug use:  No    Home Medications Prior to Admission medications   Medication Sig Start Date End Date Taking? Authorizing Provider  acetaminophen (TYLENOL) 500 MG tablet Take 1,000 mg by mouth every 4 (four) hours as needed for headache (pain).    [provider]  Acetaminophen-Codeine 300-30 MG tablet Take 1-2 tablets by mouth every 4 (four) hours as needed for pain (max 8 tabs/24 hours).  10/26/18   [provider]  albuterol (VENTOLIN HFA) 108 (90 Base) MCG/ACT inhaler Inhale 2 puffs into the lungs every 4 (four) hours as needed for wheezing or shortness of breath.  12/27/18   [provider]  allopurinol (ZYLOPRIM) 100 MG tablet Take 100 mg by mouth 2 (two) times a day. 05/16/18   [provider]  amLODipine (NORVASC) 2.5 MG tablet Take 2.5 mg by mouth every morning.  05/08/18   [provider]  benzonatate (TESSALON PERLES) 100 MG capsule Take 1 capsule (100 mg total) by mouth 3 (three) times daily as needed for cough. 01/04/19 01/04/20  Osvaldo Shipper, MD  dexamethasone (DECADRON) 2 MG tablet Take 2 tablets once daily for 3 days, then 1 tablet once daily for 3 days, then STOP. 01/04/19   Osvaldo Shipper, MD  ferrous sulfate 324 MG TBEC Take 324 mg by mouth daily with breakfast.    [provider]  furosemide (LASIX) 80 MG tablet Take 40 mg by mouth 2 (two) times daily.    [provider]  lisinopril (PRINIVIL,ZESTRIL) 20 MG tablet Take 40 mg by mouth every morning.  12/29/14   [provider]  pantoprazole (PROTONIX) 40 MG tablet Take 1 tablet (40 mg total) by mouth 2 (two) times daily for 30 days. 06/18/18 03/22/19  [place], Alvira Philips, DO  sertraline (ZOLOFT) 100 MG tablet Take 1 tablet (100 mg total) by mouth daily. Patient taking differently: Take 200 mg by mouth every morning.  07/11/12   Drema [place], MD    Allergies    Patient has no known allergies.  Review of Systems   Review of Systems  All other systems reviewed and are  negative.   Physical Exam Updated Vital Signs BP 119/86 (BP Location: Left Arm)    Pulse (!) 112    Temp 98.3 F (36.8 C) (Oral)    Resp 14    SpO2 99%   Physical Exam Vitals and nursing note reviewed.  Constitutional:      General: He is not in acute distress.    Appearance: He is well-developed.  HENT:     Head: Atraumatic.  Eyes:     Conjunctiva/sclera: Conjunctivae normal.  Cardiovascular:     Rate and Rhythm: Tachycardia present.     Pulses: Normal pulses.     Heart sounds: Normal heart sounds.  Pulmonary:     Breath sounds: Rales (faint crackles at lung bases) present.  Abdominal:     Palpations: Abdomen is soft.     Tenderness: There is no abdominal tenderness.  Musculoskeletal:        General: No swelling.     Cervical  back: Neck supple.  Skin:    Findings: No rash.  Neurological:     Mental Status: He is alert and oriented to person, place, and time.  Psychiatric:        Mood and Affect: Mood normal.     ED Results / Procedures / Treatments   Labs (all labs ordered are listed, but only abnormal results are displayed) Labs Reviewed  BASIC METABOLIC PANEL - Abnormal; Notable for the following components:      Result Value   Glucose, Bld 121 (*)    BUN 25 (*)    Creatinine, Ser 2.06 (*)    GFR calc non Af Amer 32 (*)    GFR calc Af Amer 37 (*)    All other components within normal limits  CBC WITH DIFFERENTIAL/PLATELET - Abnormal; Notable for the following components:   WBC 10.6 (*)    Neutro Abs 8.8 (*)    All other components within normal limits  LACTIC ACID, PLASMA - Abnormal; Notable for the following components:   Lactic Acid, Venous 2.0 (*)    All other components within normal limits  TRIGLYCERIDES - Abnormal; Notable for the following components:   Triglycerides 173 (*)    All other components within normal limits  FIBRINOGEN - Abnormal; Notable for the following components:   Fibrinogen 671 (*)    All other components within normal limits   C-REACTIVE PROTEIN - Abnormal; Notable for the following components:   CRP 3.3 (*)    All other components within normal limits  SARS CORONAVIRUS 2 (TAT 6-24 HRS)  CULTURE, BLOOD (ROUTINE X 2)  CULTURE, BLOOD (ROUTINE X 2)  BRAIN NATRIURETIC PEPTIDE  D-DIMER, QUANTITATIVE (NOT AT [facility])  PROCALCITONIN  LACTATE DEHYDROGENASE  FERRITIN  BLOOD GAS, VENOUS  LACTIC ACID, PLASMA    EKG EKG Interpretation  Date/Time:  Monday January 18 2019 11:30:22 EST Ventricular Rate:  113 PR Interval:  136 QRS Duration: 96 QT Interval:  346 QTC Calculation: 474 R Axis:   94 Text Interpretation: Sinus tachycardia Rightward axis RSR' or QR pattern in V1 suggests right ventricular conduction delay T wave abnormality, consider inferior ischemia Abnormal ECG Confirmed by Margarita Grizzle [phone]) on 01/18/2019 11:57:46 AM   Radiology DG Chest Portable 1 View  Result Date: 01/18/2019 CLINICAL DATA:  COVID, shortness of breath. EXAM: PORTABLE CHEST 1 VIEW COMPARISON:  Chest radiograph 12/26/2018 FINDINGS: Shallow inspiration radiograph, accentuating the cardiac silhouette and resulting in crowding of the central bronchovascular markings. Similar appearance of patchy bilateral airspace opacities as compared to chest radiograph 01/05/2019. No sizable pleural effusion or evidence of pneumothorax. No acute bony abnormality. Overlying cardiac monitoring leads. IMPRESSION: Similar appearance of patchy bilateral airspace opacities as compared to 01/05/2019. The findings are consistent with multifocal pneumonia in the appropriate clinical setting. Electronically Signed   By: Jackey Loge DO   On: 01/18/2019 15:14    Procedures .Critical Care Performed by: Fayrene Helper, PA-C Authorized by: Fayrene Helper, PA-C   Critical care provider statement:    Critical care time (minutes):  45   Critical care was time spent personally by me on the following activities:  Discussions with consultants, evaluation of patient's  response to treatment, examination of patient, ordering and performing treatments and interventions, ordering and review of laboratory studies, ordering and review of radiographic studies, pulse oximetry, re-evaluation of patient's condition, obtaining history from patient or surrogate and review of old charts   (including critical care time)  Medications Ordered in ED Medications  sodium chloride 0.9 % bolus 1,000 mL (1,000 mLs Intravenous Incomplete 01/18/19 1237)    ED Course  I have reviewed the triage vital signs and the nursing notes.  Pertinent labs & imaging results that were available during my care of the patient were reviewed by me and considered in my medical decision making (see chart for details).    MDM Rules/Calculators/A&P                      BP 119/86 (BP Location: Left Arm)    Pulse (!) 112    Temp 98.3 F (36.8 C) (Oral)    Resp 14    SpO2 99%   Final Clinical Impression(s) / ED Diagnoses Final diagnoses:  Acute respiratory disease due to COVID-19 virus    Rx / DC Orders ED Discharge Orders    None     3:35 PM Patient was diagnosed with COVID-19 approximately a month ago requiring a prolonged hospital stay due to hypoxia.  Patient received remdesivir and steroid and symptoms slowly improving and subsequently patient was discharged on 12/16.  He went home on 2 L of oxygen as needed however developed progressive worsening shortness of breath within the past 2 to 3 days.  Was documented that when he was seen by his PCP today his O2 sats was 70% on room air thus prompted his ER visit.  His only complaint is worsening shortness of breath without any other symptoms.  In the setting, I am concerning for potential underlying PE causing his shortness of breath.  Patient was found to be hypotensive and tachycardic but afebrile.  Chest x-ray shows similar appearance as previously which includes patchy bilateral airspace opacity consistent with multi focal pneumonia.  He does  have mildly elevated lactic acid of 2.0 as well as evidence of AKI with a creatinine of 2.06.  I would like to obtain a chest CT angiogram to rule out PE however due to his impaired renal function, he may benefit from a VQ scan.  I would discuss this with hospitalist and defer further evaluation to hospitalist.  Patient will need to be admitted for hypoxia and likely complication from recent COVID-19 infection.  To be noted, he has normal D-dimer and normal BNP.  Patient currently resting comfortably.  Initially he was hypotensive with a systolic blood pressure in the 80s, improved to low 053Z systolic after IV fluid.  3:45 PM Appreciate consultation from [facility] Dr. Lorin Mercy who agrees to see and admit pt   Kurt [name] was evaluated in Emergency Department on 01/18/2019 for the symptoms described in the history of present illness. He was evaluated in the context of the global COVID-19 pandemic, which necessitated consideration that the patient might be at risk for infection with the SARS-CoV-2 virus that causes COVID-19. Institutional protocols and algorithms that pertain to the evaluation of patients at risk for COVID-19 are in a state of rapid change based on information released by regulatory bodies including the CDC and federal and state organizations. These policies and algorithms were followed during the patient's care in the ED.    Domenic Moras, PA-C 01/18/19 1547    Pattricia Boss, MD 01/19/19 [phone]

## 2019-01-18 NOTE — ED Notes (Signed)
Pt  Requests to sit up at bedside, No dizziness or increased dyspnea noted,

## 2019-01-18 NOTE — H&P (Signed)
History and Physical    Kurt [name]:[id number] DOB: 1950/07/31 DOA: 01/18/2019  PCP: Kaleen Mask, MD Consultants:  Dutch Quint - neurosurgery Patient coming from:  Home - lives alone; NOK: Son, [phone]  Chief Complaint: SOB  HPI: Kurt [name] is a 68 y.o. male with medical history significant of HTN; CHF; stage 3 CKD; and recent hospitalization for COVID-19 associated PNA presenting with SOB.  He was previously hospitalized from 12/6-16 for acute hypoxic respiratory failure due to COVID-19 PNA and completed steroids and remdesivir.  He was discharged on a Rollator with 2L [place] O2 to be used with exertion.  He was discharged to home and hasn't had any wind.  He would have to stop to rest going from bedroom to bath to kitchen.  He felt like he needed O2 constantly.  It felt harder to breathe.  If he wasn't wearing O2 his sats would fall to the 70s.  His left hand feels numb.  He has pleuritic pain in his chest and side with deep breaths.  He went to his PCP today and they sent him back to the ER.  He hasn't had much of an appetite, isn't eating much but has been drinking water, tea, coffee.    ED Course:  Patient diagnosed about Thanksgiving, d/c on 12/16, doing better.  Worsening SOB, 70s on RA.  Looks more comfortable on 2L [place] O2.  Concern for PE - most other symptoms better but SOB feels worse than prior.  Sometimes increases to 4L [place] O2.  Mild renal failure.  BP 80s, improved with IVF.  Review of Systems: As per HPI; otherwise review of systems reviewed and negative.   Ambulatory Status:  Ambulates without assistance  Past Medical History:  Diagnosis Date  . Arthritis   . CHF (congestive heart failure) (HCC)    diastolic dysfunction  . Chronic back pain   . COVID-19   . Depression   . Diverticulitis   . GI bleed 05/2018  . Hypertension   . PTSD (post-traumatic stress disorder)    after  a Fall   . Renal insufficiency    Decrease in GFR- PCP removed all NSAIDS &  aspirin     Past Surgical History:  Procedure Laterality Date  . BIOPSY  06/16/2018   Procedure: BIOPSY;  Surgeon: Hilarie Fredrickson, MD;  Location: [facility] ENDOSCOPY;  Service: Endoscopy;;  . ELBOW FRACTURE SURGERY     ORIF, Shoulder manipulation    . ESOPHAGOGASTRODUODENOSCOPY N/A 04/30/2014   Procedure: ESOPHAGOGASTRODUODENOSCOPY (EGD);  Surgeon: Jeani Hawking, MD;  Location: [facility] ENDOSCOPY;  Service: Endoscopy;  Laterality: N/A;  . ESOPHAGOGASTRODUODENOSCOPY (EGD) WITH PROPOFOL N/A 06/16/2018   Procedure: ESOPHAGOGASTRODUODENOSCOPY (EGD) WITH PROPOFOL;  Surgeon: Hilarie Fredrickson, MD;  Location: [facility] ENDOSCOPY;  Service: Endoscopy;  Laterality: N/A;  . HEMOSTASIS CLIP PLACEMENT  06/16/2018   Procedure: HEMOSTASIS CLIP PLACEMENT;  Surgeon: Hilarie Fredrickson, MD;  Location: [facility] ENDOSCOPY;  Service: Endoscopy;;  . LUMBAR LAMINECTOMY/DECOMPRESSION MICRODISCECTOMY N/A 02/16/2015   Procedure: Thoracic ten - thoracic twelve laminectomy;  Surgeon: Julio Sicks, MD;  Location: [facility] NEURO ORS;  Service: Neurosurgery;  Laterality: N/A;  . LUMBAR LAMINECTOMY/DECOMPRESSION MICRODISCECTOMY N/A 03/22/2016   Procedure: Lumbar One-Two, Lumbar Two-Three, Lumbar Three-Four Laminectomy and Foraminotomy;  Surgeon: Julio Sicks, MD;  Location: [facility] OR;  Service: Neurosurgery;  Laterality: N/A;  . NASAL SEPTUM SURGERY    . SCLEROTHERAPY  06/16/2018   Procedure: SCLEROTHERAPY;  Surgeon: Hilarie Fredrickson, MD;  Location: [facility] ENDOSCOPY;  Service: Endoscopy;;  Social History   Socioeconomic History  . Marital status: Widowed    Spouse name: Not on file  . Number of children: Not on file  . Years of education: Not on file  . Highest education level: Not on file  Occupational History  . Not on file  Tobacco Use  . Smoking status: Never Smoker  . Smokeless tobacco: Never Used  Substance and Sexual Activity  . Alcohol use: Not Currently    Comment: quit in 1989   . Drug use: No  . Sexual activity: Not on file  Other Topics Concern  . Not on  file  Social History Narrative  . Not on file   Social Determinants of Health   Financial Resource Strain:   . Difficulty of Paying Living Expenses: Not on file  Food Insecurity: Unknown  . Worried About Programme researcher, broadcasting/film/videounning Out of Food in the Last Year: Patient refused  . Ran Out of Food in the Last Year: Patient refused  Transportation Needs: Unknown  . Lack of Transportation (Medical): Patient refused  . Lack of Transportation (Non-Medical): Patient refused  Physical Activity: Unknown  . Days of Exercise per Week: 3 days  . Minutes of Exercise per Session: Not on file  Stress: No Stress Concern Present  . Feeling of Stress : Only a little  Social Connections: Unknown  . Frequency of Communication with Friends and Family: Patient refused  . Frequency of Social Gatherings with Friends and Family: Patient refused  . Attends Religious Services: Patient refused  . Active Member of Clubs or Organizations: Patient refused  . Attends BankerClub or Organization Meetings: Patient refused  . Marital Status: Patient refused  Intimate Partner Violence: Unknown  . Fear of Current or Ex-Partner: Patient refused  . Emotionally Abused: Patient refused  . Physically Abused: Patient refused  . Sexually Abused: Patient refused    No Known Allergies  History reviewed. No pertinent family history.  Prior to Admission medications   Medication Sig Start Date End Date Taking? Authorizing Provider  acetaminophen (TYLENOL) 500 MG tablet Take 1,000 mg by mouth every 4 (four) hours as needed for headache (pain).   Yes [provider]  Acetaminophen-Codeine 300-30 MG tablet Take 1-2 tablets by mouth every 4 (four) hours as needed for pain (max 8 tabs/24 hours).  10/26/18  Yes [provider]  albuterol (VENTOLIN HFA) 108 (90 Base) MCG/ACT inhaler Inhale 2 puffs into the lungs every 4 (four) hours as needed for wheezing or shortness of breath.  12/27/18  Yes [provider]  allopurinol  (ZYLOPRIM) 100 MG tablet Take 100 mg by mouth 2 (two) times a day. 05/16/18  Yes [provider]  amLODipine (NORVASC) 2.5 MG tablet Take 2.5 mg by mouth every morning.  05/08/18  Yes [provider]  benzonatate (TESSALON PERLES) 100 MG capsule Take 1 capsule (100 mg total) by mouth 3 (three) times daily as needed for cough. 01/04/19 01/04/20 Yes Osvaldo ShipperKrishnan, Gokul, MD  ferrous sulfate 324 MG TBEC Take 324 mg by mouth daily with breakfast.   Yes [provider]  furosemide (LASIX) 80 MG tablet Take 40 mg by mouth 2 (two) times daily.   Yes [provider]  lisinopril (PRINIVIL,ZESTRIL) 20 MG tablet Take 40 mg by mouth every morning.  12/29/14  Yes [provider]  pantoprazole (PROTONIX) 40 MG tablet Take 1 tablet (40 mg total) by mouth 2 (two) times daily for 30 days. 06/18/18 03/22/19 Yes [place], Eric J, DO  sertraline (ZOLOFT) 100  MG tablet Take 1 tablet (100 mg total) by mouth daily. Patient taking differently: Take 200 mg by mouth every morning.  07/11/12  Yes Allie Bossier, MD  dexamethasone (DECADRON) 2 MG tablet Take 2 tablets once daily for 3 days, then 1 tablet once daily for 3 days, then STOP. Patient not taking: Reported on 01/18/2019 01/04/19   Bonnielee Haff, MD    Physical Exam: Vitals:   01/18/19 1300 01/18/19 1443 01/18/19 1556 01/18/19 1600  BP: (!) 85/66 119/86    Pulse: 98 (!) 112    Resp: (!) 21 14    Temp:  98.3 F (36.8 C)    TempSrc:  Oral    SpO2: 95% 99% 99%   Weight:    126.6 kg  Height:    5\' 10"  (1.778 m)     . General:  Appears calm and comfortable and is NAD on [place] O2 at 3-4L . Eyes:  PERRL, EOMI, normal lids, iris . ENT:  grossly normal hearing, lips & tongue, mmm; appropriate dentition . Neck:  no LAD, masses or thyromegaly . Cardiovascular:  RR with mild tachycardia, no m/r/g. No LE edema.  Marland Kitchen Respiratory:   CTA bilaterally with no wheezes/rales/rhonchi.  Normal respiratory effort. . Abdomen:  soft, NT, ND,  NABS . Back:   normal alignment, no CVAT . Skin:  no rash or induration seen on limited exam . Musculoskeletal:  grossly normal tone BUE/BLE, good ROM, no bony abnormality . Psychiatric:  grossly normal mood and affect, speech fluent and appropriate, AOx3 . Neurologic:  CN 2-12 grossly intact, moves all extremities in coordinated fashion, sensation intact    Radiological Exams on Admission: DG Chest Portable 1 View  Result Date: 01/18/2019 CLINICAL DATA:  COVID, shortness of breath. EXAM: PORTABLE CHEST 1 VIEW COMPARISON:  Chest radiograph 12/26/2018 FINDINGS: Shallow inspiration radiograph, accentuating the cardiac silhouette and resulting in crowding of the central bronchovascular markings. Similar appearance of patchy bilateral airspace opacities as compared to chest radiograph 01/05/2019. No sizable pleural effusion or evidence of pneumothorax. No acute bony abnormality. Overlying cardiac monitoring leads. IMPRESSION: Similar appearance of patchy bilateral airspace opacities as compared to 01/05/2019. The findings are consistent with multifocal pneumonia in the appropriate clinical setting. Electronically Signed   By: Kellie Simmering DO   On: 01/18/2019 15:14    EKG: Independently reviewed.  Sinus tachycardia with rate 113; nonspecific ST changes with no evidence of acute ischemia   Labs on Admission: I have personally reviewed the available labs and imaging studies at the time of the admission.  Pertinent labs:   Glucose 121 BUN 25/Creatinine 2.06/GFR 32; 27/1.22/60 on 12/15 LDH, ferritin, procalcitonin WNL CRP 3.3 Lactate 2.0 WBC 10.6 D-dimer 0.47 Fibrinogen 671 COVID POSITIVE on 12/6; COVID pending today  Assessment/Plan Principal Problem:   Acute on chronic respiratory failure with hypoxia (HCC) Active Problems:   Hypertension   Acute kidney injury superimposed on CKD (HCC)   CKD (chronic kidney disease) stage 3, GFR 30-59 ml/min   Failure to thrive in adult   Major  depression, chronic   Obesity, Class III, BMI 40-49.9 (morbid obesity) ([place])     Acute respiratory failure with hypoxia with recent COVID-19 infection -Patient with prior hospitalization from 12/6-16 for COVID-associated pneumonia -He was discharged on 2L home O2 -He is currently requiring 3-4L [place] O2 -It is hard to say if he is consistently needing more O2 or if he is simply not wearing continuous O2 at home -COVID POSITIVE -The patient has comorbidities  which may increase the risk for ARDS/MODS including: age, HTN, obesity -Pertinent labs concerning for COVID include mildly increased CRP; positive fibrinogen -CXR with multifocal opacities which may be c/w COVID vs. Multifocal PNA; this appears to be stable from prior hospitalization and prior pneumothorax has resolved.  -Will not treat with broad-spectrum antibiotics given procalcitonin <0.1 -Continue Albuterol, Tessalon, supportive measures -Given increased thromboembolic events in patients with COVID-19 infection, PE is a definite consideration - even with negative D-dimer; however, with his renal impairment he is not able to have a CTA at this time.  Will start empiric heparin drip for now and plan for CTA after renal function improves. -Will admit to telemetry for further evaluation, close monitoring, and treatment -If the patient shows clinical deterioration, consider PCCM consultation -Patient was seen wearing full PPE including: gown, gloves, head cover, N95, and face shield; donning and doffing was in compliance with current standards.  AKI on stage 3a CKD -Patient with baseline stage 3a CKD -Does not report significant PO intake after d/c, suspect he has prerenal azotemia in addition to CKD -Will rehydrate and follow -Hold Lasix and Lisinopril -Will need to carefully monitor volume status since aggressive hydration can worsen pulmonary involvement of COVID  Failure to thrive -Patient lives alone and did not eat well or  consistently wear O2 at home -He returns with hypoxia and AKI -PT/OT evaluations ordered -He may benefit from short-term rehab to regain his independence in the setting of post-COVID weakness  HTN -Hold Lisinopril in the setting of AKI -Patient with persistent borderline hypotension in the ER - volume responsive and likely due to AKI - so will hold Norvasc for now  Depression -Continue Zoloft  Obesity -BMI 40 -Weight loss should be encouraged -Outpatient PCP/bariatric medicine/bariatric surgery f/u encouraged     DVT prophylaxis:  Heparin  Code Status:  Full - confirmed with patient Family Communication: None present; I spoke with the patient's son by telephone. Disposition Plan:  Home once clinically improved Consults called: PT/OT  Admission status: Admit - It is my clinical opinion that admission to INPATIENT is reasonable and necessary because of the expectation that this patient will require hospital care that crosses at least 2 midnights to treat this condition based on the medical complexity of the problems presented.  Given the aforementioned information, the predictability of an adverse outcome is felt to be significant.    Jonah Blue MD [facility]   How to contact the [facility] Attending or Consulting provider 7A - 7P or covering provider during after hours 7P -7A, for this patient?  1. Check the care team in [facility] and look for a) attending/consulting TR[facility] provider listed and b) the [facility] team listed 2. Log into [email or web address] and use [place]'s universal password to access. If you do not have the password, please contact the hospital operator. 3. Locate the [facility] provider you are looking for under [facility] and page to a number that you can be directly reached. 4. If you still have difficulty reaching the provider, please page the [facility] (Director on Call) for the Hospitalists listed on amion for assistance.   01/18/2019, 6:27 PM

## 2019-01-18 NOTE — ED Triage Notes (Signed)
Sent from dr's office with shortness of breath-- sats were 70s on room air-- was d/c'd with O2 after having covid --

## 2019-01-19 ENCOUNTER — Inpatient Hospital Stay (HOSPITAL_COMMUNITY): Payer: Medicare Other

## 2019-01-19 DIAGNOSIS — N179 Acute kidney failure, unspecified: Secondary | ICD-10-CM

## 2019-01-19 DIAGNOSIS — N189 Chronic kidney disease, unspecified: Secondary | ICD-10-CM

## 2019-01-19 LAB — CBC WITH DIFFERENTIAL/PLATELET
Abs Immature Granulocytes: 0.03 10*3/uL (ref 0.00–0.07)
Basophils Absolute: 0.1 10*3/uL (ref 0.0–0.1)
Basophils Relative: 1 %
Eosinophils Absolute: 0.5 10*3/uL (ref 0.0–0.5)
Eosinophils Relative: 7 %
HCT: 36.9 % — ABNORMAL LOW (ref 39.0–52.0)
Hemoglobin: 11.4 g/dL — ABNORMAL LOW (ref 13.0–17.0)
Immature Granulocytes: 0 %
Lymphocytes Relative: 16 %
Lymphs Abs: 1.2 10*3/uL (ref 0.7–4.0)
MCH: 27.5 pg (ref 26.0–34.0)
MCHC: 30.9 g/dL (ref 30.0–36.0)
MCV: 88.9 fL (ref 80.0–100.0)
Monocytes Absolute: 0.6 10*3/uL (ref 0.1–1.0)
Monocytes Relative: 8 %
Neutro Abs: 5 10*3/uL (ref 1.7–7.7)
Neutrophils Relative %: 68 %
Platelets: 171 10*3/uL (ref 150–400)
RBC: 4.15 MIL/uL — ABNORMAL LOW (ref 4.22–5.81)
RDW: 15.1 % (ref 11.5–15.5)
WBC: 7.3 10*3/uL (ref 4.0–10.5)
nRBC: 0 % (ref 0.0–0.2)

## 2019-01-19 LAB — COMPREHENSIVE METABOLIC PANEL
ALT: 20 U/L (ref 0–44)
AST: 15 U/L (ref 15–41)
Albumin: 2.7 g/dL — ABNORMAL LOW (ref 3.5–5.0)
Alkaline Phosphatase: 59 U/L (ref 38–126)
Anion gap: 10 (ref 5–15)
BUN: 20 mg/dL (ref 8–23)
CO2: 26 mmol/L (ref 22–32)
Calcium: 8.3 mg/dL — ABNORMAL LOW (ref 8.9–10.3)
Chloride: 103 mmol/L (ref 98–111)
Creatinine, Ser: 1.36 mg/dL — ABNORMAL HIGH (ref 0.61–1.24)
GFR calc Af Amer: >60 mL/min (ref >60)
GFR calc non Af Amer: 52 mL/min — ABNORMAL LOW (ref >60)
Glucose, Bld: 98 mg/dL (ref 70–99)
Potassium: 3.7 mmol/L (ref 3.5–5.1)
Sodium: 139 mmol/L (ref 135–145)
Total Bilirubin: 0.7 mg/dL (ref 0.3–1.2)
Total Protein: 5.7 g/dL — ABNORMAL LOW (ref 6.5–8.1)

## 2019-01-19 LAB — D-DIMER, QUANTITATIVE: D-Dimer, Quant: 0.4 ug/mL-FEU (ref 0.00–0.50)

## 2019-01-19 LAB — MAGNESIUM: Magnesium: 1.9 mg/dL (ref 1.7–2.4)

## 2019-01-19 LAB — HEPARIN LEVEL (UNFRACTIONATED)
Heparin Unfractionated: 0.66 IU/mL (ref 0.30–0.70)
Heparin Unfractionated: 0.46 IU/mL (ref 0.30–0.70)

## 2019-01-19 MED ORDER — ENOXAPARIN SODIUM 40 MG/0.4ML ~~LOC~~ SOLN
40.0000 mg | SUBCUTANEOUS | Status: DC
  Administered 2019-01-19 – 2019-01-22 (×4): 40 mg via SUBCUTANEOUS
  Filled 2019-01-19 (×4): qty 0.4

## 2019-01-19 MED ORDER — SALINE SPRAY 0.65 % NA SOLN
1.0000 | NASAL | Status: DC | PRN
  Administered 2019-01-20: 1 via NASAL
  Filled 2019-01-19 (×2): qty 44

## 2019-01-19 MED ORDER — IOHEXOL 350 MG/ML SOLN
80.0000 mL | Freq: Once | INTRAVENOUS | Status: AC | PRN
  Administered 2019-01-19: 80 mL via INTRAVENOUS

## 2019-01-19 NOTE — Progress Notes (Signed)
OT Cancellation Note  Pt scheduled to transfer to [place]. Will defer OT eval to [facility].   Maurie Boettcher, OT/L   Acute OT Clinical Specialist Acute Rehabilitation Services Pager [phone] Office [phone]

## 2019-01-19 NOTE — ED Notes (Signed)
Doug ([facility]/Transfer to [facility] @ 1148 -per Charge called by Levada Dy Will have to call back after CTA has been done.

## 2019-01-19 NOTE — Progress Notes (Signed)
Spoke to Pharmacy pt arrived to Rm 141 then taken down to CT for Head and Chest.  Pt on Heparin gtt received as transfer from other [facility] facility.  Per Pharmacy/Erin pt is to have daily Heparin Fraction draws at this point based on last draw at 10:30am.  No need to order for this afternoon  Pt currently infusing at 16/hr

## 2019-01-19 NOTE — ED Notes (Signed)
Lunch Tray Ordered @ 1033. 

## 2019-01-19 NOTE — ED Notes (Signed)
Breakfast at bedside.

## 2019-01-19 NOTE — ED Notes (Signed)
Doug([facility] called back for transfer to [facility]) @ 1200-per Thurmond Butts, RN called by Levada Dy

## 2019-01-19 NOTE — ED Notes (Addendum)
ED TO INPATIENT HANDOFF REPORT  ED Nurse Name and Phone #: Thurmond Butts [place] Name/Age/Gender Kurt [name] 68 y.o. male Room/Bed: 011C/011C  Code Status   Code Status: Full Code  Home/SNF/Other Home Patient oriented to: self, place, time and situation Is this baseline? Yes   Triage Complete: Triage complete  Chief Complaint Acute on chronic respiratory failure with hypoxia ([place]) [J96.21]  Triage Note Sent from dr's office with shortness of breath-- sats were 70s on room air-- was d/c'd with O2 after having covid --      Allergies No Known Allergies  Level of Care/Admitting Diagnosis ED Disposition    ED Disposition Condition [place]: [place] [[id number]]  Level of Care: Med-Surg [16]  Covid Evaluation: Confirmed COVID Positive  Diagnosis: Acute on chronic respiratory failure with hypoxia [facility]) [[id number]]  Admitting Physician: Karmen Bongo [[id number]]  Attending Physician: Karmen Bongo [[id number]]  Estimated length of stay: 3 - 4 days  Certification:: I certify this patient will need inpatient services for at least 2 midnights       B Medical/Surgery History Past Medical History:  Diagnosis Date  . Arthritis   . CHF (congestive heart failure) (HCC)    diastolic dysfunction  . Chronic back pain   . COVID-19   . Depression   . Diverticulitis   . GI bleed 05/2018  . Hypertension   . PTSD (post-traumatic stress disorder)    after  a Fall   . Renal insufficiency    Decrease in GFR- PCP removed all NSAIDS & aspirin    Past Surgical History:  Procedure Laterality Date  . BIOPSY  06/16/2018   Procedure: BIOPSY;  Surgeon: Irene Shipper, MD;  Location: [facility] ENDOSCOPY;  Service: Endoscopy;;  . ELBOW FRACTURE SURGERY     ORIF, Shoulder manipulation    . ESOPHAGOGASTRODUODENOSCOPY N/A 04/30/2014   Procedure: ESOPHAGOGASTRODUODENOSCOPY (EGD);  Surgeon: Carol Ada, MD;  Location: [facility] ENDOSCOPY;  Service: Endoscopy;  Laterality:  N/A;  . ESOPHAGOGASTRODUODENOSCOPY (EGD) WITH PROPOFOL N/A 06/16/2018   Procedure: ESOPHAGOGASTRODUODENOSCOPY (EGD) WITH PROPOFOL;  Surgeon: Irene Shipper, MD;  Location: [facility] ENDOSCOPY;  Service: Endoscopy;  Laterality: N/A;  . HEMOSTASIS CLIP PLACEMENT  06/16/2018   Procedure: HEMOSTASIS CLIP PLACEMENT;  Surgeon: Irene Shipper, MD;  Location: [facility] ENDOSCOPY;  Service: Endoscopy;;  . LUMBAR LAMINECTOMY/DECOMPRESSION MICRODISCECTOMY N/A 02/16/2015   Procedure: Thoracic ten - thoracic twelve laminectomy;  Surgeon: Earnie Larsson, MD;  Location: [place] NEURO ORS;  Service: Neurosurgery;  Laterality: N/A;  . LUMBAR LAMINECTOMY/DECOMPRESSION MICRODISCECTOMY N/A 03/22/2016   Procedure: Lumbar One-Two, Lumbar Two-Three, Lumbar Three-Four Laminectomy and Foraminotomy;  Surgeon: Earnie Larsson, MD;  Location: [place];  Service: Neurosurgery;  Laterality: N/A;  . NASAL SEPTUM SURGERY    . SCLEROTHERAPY  06/16/2018   Procedure: SCLEROTHERAPY;  Surgeon: Irene Shipper, MD;  Location: [facility] ENDOSCOPY;  Service: Endoscopy;;     A IV Location/Drains/Wounds Patient Lines/Drains/Airways Status   Active Line/Drains/Airways    Name:   Placement date:   Placement time:   Site:   Days:   Peripheral IV 01/18/19 Right Antecubital   01/18/19    1230    Antecubital   1   Peripheral IV 01/19/19 Left Antecubital   01/19/19    0522    Antecubital   less than 1          Intake/Output Last 24 hours  Intake/Output Summary (Last 24 hours) at 01/19/2019 1158 Last data filed at  01/19/2019 0103 Gross per 24 hour  Intake 1653.33 ml  Output --  Net 1653.33 ml    Labs/Imaging Results for orders placed or performed during the hospital encounter of 01/18/19 (from the past 48 hour(s))  SARS CORONAVIRUS 2 (TAT 6-24 HRS) Nasopharyngeal Nasopharyngeal Swab     Status: Abnormal   Collection Time: 01/18/19 12:03 PM   Specimen: Nasopharyngeal Swab  Result Value Ref Range   SARS Coronavirus 2 POSITIVE (A) NEGATIVE    Comment: RESULT CALLED TO,  READ BACK BY AND VERIFIED WITH: M.COFFEY RN 1754 01/18/2019 MCCORMICK K (NOTE) SARS-CoV-2 target nucleic acids are DETECTED. The SARS-CoV-2 RNA is generally detectable in upper and lower respiratory specimens during the acute phase of infection. Positive results are indicative of the presence of SARS-CoV-2 RNA. Clinical correlation with patient history and other diagnostic information is  necessary to determine patient infection status. Positive results do not rule out bacterial infection or co-infection with other viruses.  The expected result is Negative. Fact Sheet for Patients: HairSlick.no Fact Sheet for Healthcare Providers: quierodirigir.com This test is not yet approved or cleared by the [place] FDA and  has been authorized for detection and/or diagnosis of SARS-CoV-2 by FDA under an Emergency Use Authorization (EUA). This EUA will remain  in effect (meaning this test can be used) for  the duration of the COVID-19 declaration under Section 564(b)(1) of the Act, 21 U.S.C. section 360bbb-3(b)(1), unless the authorization is terminated or revoked sooner. Performed at [facility], [address] [place], [place] [address]   Blood Culture (routine x 2)     Status: None (Preliminary result)   Collection Time: 01/18/19 12:03 PM   Specimen: BLOOD  Result Value Ref Range   Specimen Description BLOOD RIGHT ANTECUBITAL    Special Requests      BOTTLES DRAWN AEROBIC AND ANAEROBIC Blood Culture adequate volume   Culture      NO GROWTH < 24 HOURS Performed at [facility], [address] [place], [place] [address]    Report Status PENDING   Blood Culture (routine x 2)     Status: None (Preliminary result)   Collection Time: 01/18/19 12:08 PM   Specimen: BLOOD  Result Value Ref Range   Specimen Description BLOOD BLOOD RIGHT HAND    Special Requests      BOTTLES DRAWN AEROBIC AND ANAEROBIC Blood Culture  adequate volume   Culture      NO GROWTH < 24 HOURS Performed at [facility], [address] [place], [place] [address]    Report Status PENDING   Basic metabolic panel     Status: Abnormal   Collection Time: 01/18/19 12:15 PM  Result Value Ref Range   Sodium 139 135 - 145 mmol/L   Potassium 3.6 3.5 - 5.1 mmol/L   Chloride 99 98 - 111 mmol/L   CO2 26 22 - 32 mmol/L   Glucose, Bld 121 (H) 70 - 99 mg/dL   BUN 25 (H) 8 - 23 mg/dL   Creatinine, Ser 9.14 (H) 0.61 - 1.24 mg/dL   Calcium 8.9 8.9 - 78.2 mg/dL   GFR calc non Af Amer 32 (L) >60 mL/min   GFR calc Af Amer 37 (L) >60 mL/min   Anion gap 14 5 - 15    Comment: Performed at [facility] Lab, [address] [place], [place] [address]  CBC with Differential     Status: Abnormal   Collection Time: 01/18/19 12:15 PM  Result Value Ref Range   WBC 10.6 (H) 4.0 - 10.5 K/uL   RBC 4.77 4.22 - 5.81 MIL/uL   Hemoglobin 13.1 13.0 - 17.0 g/dL   HCT 43.3 29.5 - 18.8 %   MCV 87.0 80.0 - 100.0 fL   MCH 27.5 26.0 - 34.0 pg   MCHC 31.6 30.0 - 36.0 g/dL   RDW 41.6 60.6 - 30.1 %   Platelets 221 150 - 400 K/uL   nRBC 0.0 0.0 - 0.2 %   Neutrophils Relative % 81 %   Neutro Abs 8.8 (H) 1.7 - 7.7 K/uL   Lymphocytes Relative 8 %   Lymphs Abs 0.8 0.7 - 4.0 K/uL   Monocytes Relative 7 %   Monocytes Absolute 0.7 0.1 - 1.0 K/uL   Eosinophils Relative 2 %   Eosinophils Absolute 0.2 0.0 - 0.5 K/uL   Basophils Relative 1 %   Basophils Absolute 0.1 0.0 - 0.1 K/uL   Immature Granulocytes 1 %   Abs Immature Granulocytes 0.05 0.00 - 0.07 K/uL    Comment: Performed at [facility] Lab, [address], [place], [place] [address]  Brain natriuretic peptide     Status: None   Collection Time: 01/18/19 12:15 PM  Result Value Ref Range   B Natriuretic Peptide 20.8 0.0 - 100.0 pg/mL    Comment: Performed at [facility] Lab, [address], [place], [place] [address]  D-dimer, quantitative     Status: None   Collection Time: 01/18/19 12:15 PM   Result Value Ref Range   D-Dimer, Quant 0.47 0.00 - 0.50 ug/mL-FEU    Comment: (NOTE) At the manufacturer cut-off of 0.50 ug/mL FEU, this assay has been documented to exclude PE with a sensitivity and negative predictive value of 97 to 99%.  At this time, this assay has not been approved by the FDA to exclude DVT/VTE. Results should be correlated with clinical presentation. Performed at [facility] Lab, [address] [place], [place] [address]   Procalcitonin     Status: None   Collection Time: 01/18/19 12:15 PM  Result Value Ref Range   Procalcitonin <0.10 ng/mL    Comment:        Interpretation: PCT (Procalcitonin) <= 0.5 ng/mL: Systemic infection (sepsis) is not likely. Local bacterial infection is possible. (NOTE)       Sepsis PCT Algorithm           Lower Respiratory Tract                                      Infection PCT Algorithm    ----------------------------     ----------------------------         PCT < 0.25 ng/mL                PCT < 0.10 ng/mL         Strongly encourage             Strongly discourage   discontinuation of antibiotics    initiation of antibiotics    ----------------------------     -----------------------------       PCT 0.25 - 0.50 ng/mL            PCT 0.10 - 0.25 ng/mL               OR       >80% decrease in PCT  Discourage initiation of                                            antibiotics      Encourage discontinuation           of antibiotics    ----------------------------     -----------------------------         PCT >= 0.50 ng/mL              PCT 0.26 - 0.50 ng/mL               AND        <80% decrease in PCT             Encourage initiation of                                             antibiotics       Encourage continuation           of antibiotics    ----------------------------     -----------------------------        PCT >= 0.50 ng/mL                  PCT > 0.50 ng/mL               AND         increase in PCT                   Strongly encourage                                      initiation of antibiotics    Strongly encourage escalation           of antibiotics                                     -----------------------------                                           PCT <= 0.25 ng/mL                                                 OR                                        > 80% decrease in PCT                                     Discontinue / Do not initiate  antibiotics Performed at [facility], [address], [place], [place] [address]   Lactate dehydrogenase     Status: None   Collection Time: 01/18/19 12:15 PM  Result Value Ref Range   LDH 146 98 - 192 U/L    Comment: Performed at [facility], [address], [place], [place] [address]  Ferritin     Status: None   Collection Time: 01/18/19 12:15 PM  Result Value Ref Range   Ferritin 131 24 - 336 ng/mL    Comment: Performed at [facility], [address] [place], [place] [address]  Triglycerides     Status: Abnormal   Collection Time: 01/18/19 12:15 PM  Result Value Ref Range   Triglycerides 173 (H) <150 mg/dL    Comment: Performed at [facility], [address], [place], [place] [address]  Fibrinogen     Status: Abnormal   Collection Time: 01/18/19 12:15 PM  Result Value Ref Range   Fibrinogen 671 (H) 210 - 475 mg/dL    Comment: Performed at [facility], [address], [place], [place] [address]  C-reactive protein     Status: Abnormal   Collection Time: 01/18/19 12:15 PM  Result Value Ref Range   CRP 3.3 (H) <1.0 mg/dL    Comment: Performed at [facility] Lab, [address], [place], [place] [address]  Lactic acid, plasma     Status: Abnormal   Collection Time: 01/18/19 12:41 PM  Result Value Ref Range   Lactic Acid, Venous 2.0 (HH) 0.5 - 1.9 mmol/L    Comment: CRITICAL RESULT CALLED TO, READ BACK BY AND VERIFIED WITH: MICHELLE COFFEY,RN  AT 1347 01/18/2019 BY ZBEECH. Performed at [facility], [address] [place], [place] [address]   Lactic acid, plasma     Status: None   Collection Time: 01/18/19  6:11 PM  Result Value Ref Range   Lactic Acid, Venous 1.3 0.5 - 1.9 mmol/L    Comment: Performed at [facility], [address], [place], [place] [address]  Heparin level (unfractionated)     Status: None   Collection Time: 01/19/19  1:30 AM  Result Value Ref Range   Heparin Unfractionated 0.66 0.30 - 0.70 IU/mL    Comment: (NOTE) If heparin results are below expected values, and patient dosage has  been confirmed, suggest follow up testing of antithrombin III levels. Performed at [facility], [address] [place], [place] [address]   D-dimer, quantitative (not at [facility])     Status: None   Collection Time: 01/19/19  1:30 AM  Result Value Ref Range   D-Dimer, Quant 0.40 0.00 - 0.50 ug/mL-FEU    Comment: (NOTE) At the manufacturer cut-off of 0.50 ug/mL FEU, this assay has been documented to exclude PE with a sensitivity and negative predictive value of 97 to 99%.  At this time, this assay has not been approved by the FDA to exclude DVT/VTE. Results should be correlated with clinical presentation. Performed at [facility] Lab, [address] [place], [place] [address]   CBC with Differential/Platelet     Status: Abnormal   Collection Time: 01/19/19  5:00 AM  Result Value Ref Range   WBC 7.3 4.0 - 10.5 K/uL   RBC 4.15 (L) 4.22 - 5.81 MIL/uL   Hemoglobin 11.4 (L) 13.0 - 17.0 g/dL   HCT 02.7 (L) 25.3 - 66.4 %   MCV 88.9 80.0 -  100.0 fL   MCH 27.5 26.0 - 34.0 pg   MCHC 30.9 30.0 - 36.0 g/dL   RDW 16.115.1 09.611.5 - 04.515.5 %   Platelets 171 150 - 400 K/uL   nRBC 0.0 0.0 - 0.2 %   Neutrophils Relative % 68 %   Neutro Abs 5.0 1.7 - 7.7 K/uL   Lymphocytes Relative 16 %   Lymphs Abs 1.2 0.7 - 4.0 K/uL   Monocytes Relative 8 %   Monocytes Absolute 0.6 0.1 - 1.0 K/uL   Eosinophils Relative 7 %    Eosinophils Absolute 0.5 0.0 - 0.5 K/uL   Basophils Relative 1 %   Basophils Absolute 0.1 0.0 - 0.1 K/uL   Immature Granulocytes 0 %   Abs Immature Granulocytes 0.03 0.00 - 0.07 K/uL    Comment: Performed at [facility], [address] [place], [place] [address]  Comprehensive metabolic panel     Status: Abnormal   Collection Time: 01/19/19  5:00 AM  Result Value Ref Range   Sodium 139 135 - 145 mmol/L   Potassium 3.7 3.5 - 5.1 mmol/L   Chloride 103 98 - 111 mmol/L   CO2 26 22 - 32 mmol/L   Glucose, Bld 98 70 - 99 mg/dL   BUN 20 8 - 23 mg/dL   Creatinine, Ser 1.911.36 (H) 0.61 - 1.24 mg/dL   Calcium 8.3 (L) 8.9 - 10.3 mg/dL   Total Protein 5.7 (L) 6.5 - 8.1 g/dL   Albumin 2.7 (L) 3.5 - 5.0 g/dL   AST 15 15 - 41 U/L   ALT 20 0 - 44 U/L   Alkaline Phosphatase 59 38 - 126 U/L   Total Bilirubin 0.7 0.3 - 1.2 mg/dL   GFR calc non Af Amer 52 (L) >60 mL/min   GFR calc Af Amer >60 >60 mL/min   Anion gap 10 5 - 15    Comment: Performed at [facility], [address] [place], [place] [address]  Heparin level (unfractionated)     Status: None   Collection Time: 01/19/19 10:30 AM  Result Value Ref Range   Heparin Unfractionated 0.46 0.30 - 0.70 IU/mL    Comment: (NOTE) If heparin results are below expected values, and patient dosage has  been confirmed, suggest follow up testing of antithrombin III levels. Performed at [facility], [address] [place], [place] [address]   Magnesium     Status: None   Collection Time: 01/19/19 10:30 AM  Result Value Ref Range   Magnesium 1.9 1.7 - 2.4 mg/dL    Comment: Performed at [facility], [address] [place], [place] [address]   DG Chest Portable 1 View  Result Date: 01/18/2019 CLINICAL DATA:  COVID, shortness of breath. EXAM: PORTABLE CHEST 1 VIEW COMPARISON:  Chest radiograph 12/26/2018 FINDINGS: Shallow inspiration radiograph, accentuating the cardiac silhouette and resulting in crowding of the central  bronchovascular markings. Similar appearance of patchy bilateral airspace opacities as compared to chest radiograph 01/05/2019. No sizable pleural effusion or evidence of pneumothorax. No acute bony abnormality. Overlying cardiac monitoring leads. IMPRESSION: Similar appearance of patchy bilateral airspace opacities as compared to 01/05/2019. The findings are consistent with multifocal pneumonia in the appropriate clinical setting. Electronically Signed   By: Jackey LogeKyle  Golden DO   On: 01/18/2019 15:14    Pending Labs Unresulted Labs (From admission, onward)    Start     Ordered   01/20/19 0500  Heparin level (unfractionated)  Daily,  R     01/18/19 1700   01/19/19 0500  CBC with Differential/Platelet  Daily,   R     01/18/19 1647   01/19/19 0500  Comprehensive metabolic panel  Daily,   R     01/18/19 1647          Vitals/Pain Today's Vitals   01/19/19 0813 01/19/19 0900 01/19/19 1000 01/19/19 1100  BP: 125/82 126/63 139/88 (!) 110/96  Pulse: 94 97 87 91  Resp: Temp:      TempSrc:      SpO2: 97% 96% 93% 95%  Weight:      Height:      PainSc:        Isolation Precautions Airborne and Contact precautions  Medications Medications  acetaminophen-codeine (TYLENOL #3) 300-30 MG per tablet 1-2 tablet (has no administration in time range)  allopurinol (ZYLOPRIM) tablet 100 mg (100 mg Oral Given 01/18/19 2154)  sertraline (ZOLOFT) tablet 200 mg (has no administration in time range)  pantoprazole (PROTONIX) EC tablet 40 mg (40 mg Oral Given 01/18/19 2155)  albuterol (VENTOLIN HFA) 108 (90 Base) MCG/ACT inhaler 2 puff (has no administration in time range)  benzonatate (TESSALON) capsule 100 mg (has no administration in time range)  sodium chloride flush (NS) 0.9 % injection 3 mL (3 mLs Intravenous Given 01/18/19 2156)  sodium chloride flush (NS) 0.9 % injection 3 mL (has no administration in time range)  0.9 %  sodium chloride infusion (has no administration in time range)   guaiFENesin-dextromethorphan (ROBITUSSIN DM) 100-10 MG/5ML syrup 10 mL (has no administration in time range)  chlorpheniramine-HYDROcodone (TUSSIONEX) 10-8 MG/5ML suspension 5 mL (has no administration in time range)  acetaminophen (TYLENOL) tablet 650 mg (has no administration in time range)  oxyCODONE (Oxy IR/ROXICODONE) immediate release tablet 5 mg (has no administration in time range)  polyethylene glycol (MIRALAX / GLYCOLAX) packet 17 g (has no administration in time range)  bisacodyl (DULCOLAX) EC tablet 5 mg (has no administration in time range)  sodium phosphate (FLEET) 7-19 GM/118ML enema 1 enema (has no administration in time range)  ondansetron (ZOFRAN) tablet 4 mg (has no administration in time range)    Or  ondansetron (ZOFRAN) injection 4 mg (has no administration in time range)  lactated ringers infusion ( Intravenous New Bag/Given 01/19/19 0200)  heparin ADULT infusion 100 units/mL (25000 units/234mL sodium chloride 0.45%) (1,600 Units/hr Intravenous New Bag/Given 01/19/19 0812)  sodium chloride 0.9 % bolus 1,000 mL (0 mLs Intravenous Stopped 01/18/19 1340)  heparin bolus via infusion 5,500 Units (5,500 Units Intravenous Bolus from Bag 01/18/19 1840)    Mobility walks     Focused Assessments    R Recommendations: See Admitting Provider Note  Report given to: [facility] RN  Additional Notes:

## 2019-01-19 NOTE — ED Notes (Signed)
Patient's oxygen saturation showed at 88% with a good wave form. Patient was asked to take deep breaths through the nasal cannula. Oxygen came up to 92%. Oxygen was bumped up to 6 liters and it showed at 98%.

## 2019-01-19 NOTE — Progress Notes (Signed)
PROGRESS NOTE    Kurt [name]    Code Status: Full Code  WUJ:[id number] DOB: February 12, 1950 DOA: 01/18/2019  PCP: Kaleen Mask, MD    Hospital Summary  Per Dr. Ophelia Charter H&P:  HPI: Kurt [name] is a 68 y.o. male with medical history significant of HTN; CHF; stage 3 CKD; and recent hospitalization for COVID-19 associated PNA presenting with SOB.  He was previously hospitalized from 12/6-16 for acute hypoxic respiratory failure due to COVID-19 PNA and completed steroids and remdesivir.  He was discharged on a Rollator with 2L [place] O2 to be used with exertion.  He was discharged to home and hasn't had any wind.  He would have to stop to rest going from bedroom to bath to kitchen.  He felt like he needed O2 constantly.  It felt harder to breathe.  If he wasn't wearing O2 his sats would fall to the 70s.  His left hand feels numb.  He has pleuritic pain in his chest and side with deep breaths.  He went to his PCP today and they sent him back to the ER.  He hasn't had much of an appetite, isn't eating much but has been drinking water, tea, coffee.    ED Course:  Patient diagnosed about Thanksgiving, d/c on 12/16, doing better.  Worsening SOB, 70s on RA.  Looks more comfortable on 2L [place] O2.  Concern for PE - most other symptoms better but SOB feels worse than prior.  Sometimes increases to 4L  O2.  Mild renal failure.  BP 80s, improved with IVF.  Started on empiric heparin for suspected PE. Noted to be on 6 L 12/29. Transferred to [facility] initially on 12/29, however patient was suspected to be non infectious due to COVID diagnosis >21 days ago and here with suspected PE. Plan for transfer back to [facility] for further management.   A & P   Principal Problem:   Acute on chronic respiratory failure with hypoxia (HCC) Active Problems:   Hypertension   Acute kidney injury superimposed on CKD (HCC)   CKD (chronic kidney disease) stage 3, GFR 30-59 ml/min   Failure to thrive in adult   Major depression,  chronic   Obesity, Class III, BMI 40-49.9 (morbid obesity) (HCC)   1. Acute Hypoxic respiratory failure in setting of recent COVID 19 diagnosis, suspect PE a. On 6 L nasal canula, no O2 at baseline b. On Empiric heparin c. Renal function improved to near baseline d. CTA chest ordered, monitor renal function e. Completed steroid and remdesivir treatment last hospitalization, currently not indicated f. Continue telemetry g. Hold antibiotics for now as no fever, and negative procalcitonin and suspected other etiology. 2. SIRS suspect secondary to above in setting of  a. Leukocytosis 10->7, resolved. Tachycardic and requiring O2. Procalcitonin negative b. Follow up blood cultures 3. AKI on CKD 3a a. Improved near baseline with IV fluids b. Continue holding lasix and lisinopril 4. Failure to thrive a. Lives alone and poor PO intake noted on admission since previous hospitalization b. PT/OT 5. HTN a. Holding lisinopril due to AKI 6. Depression a. Zoloft 7. Obesity a. Recommend outpatient management  DVT prophylaxis: heparin Family Communication: discussed with patient at bedside Disposition Plan: transferred to [facility] but may be transferring back to [facility] due to noninfectious status   Consultants  none  Procedures  none  Antibiotics   Anti-infectives (From admission, onward)   None           Subjective  Feels slightly improved from presentation but still short of breath. requiring 6 l nasal canula. Denies palpitations, some right sided chest pain with inspiration. No other complaints.  Objective   Vitals:   01/19/19 1100 01/19/19 1200 01/19/19 1515 01/19/19 1700  BP: (!) 110/96 133/80 108/90 126/74  Pulse: 91 88 87 90  Resp: 19 (!) 22 17 17   Temp:      TempSrc:    Oral  SpO2: 95% 94% 98% 96%  Weight:      Height:        Intake/Output Summary (Last 24 hours) at 01/19/2019 1726 Last data filed at 01/19/2019 0103 Gross per 24 hour  Intake 653.33 ml  Output --   Net 653.33 ml   Filed Weights   01/18/19 1600  Weight: 126.6 kg    Examination:  Physical Exam Vitals and nursing note reviewed.  Constitutional:      Appearance: He is obese.     Comments: Comfortable on 6 L  HENT:     Head: Normocephalic and atraumatic.     Nose: Nose normal.     Mouth/Throat:     Mouth: Mucous membranes are moist.  Eyes:     Extraocular Movements: Extraocular movements intact.  Cardiovascular:     Rate and Rhythm: Normal rate.     Pulses: Normal pulses.  Pulmonary:     Effort: Pulmonary effort is normal. No respiratory distress.  Abdominal:     General: Abdomen is flat.     Palpations: Abdomen is soft.  Musculoskeletal:        General: No swelling or tenderness.     Cervical back: Normal range of motion. No rigidity.  Neurological:     Mental Status: He is alert. Mental status is at baseline.  Psychiatric:        Mood and Affect: Mood normal.        Behavior: Behavior normal.     Data Reviewed: I have personally reviewed following labs and imaging studies  CBC: Recent Labs  Lab 01/18/19 1215 01/19/19 0500  WBC 10.6* 7.3  NEUTROABS 8.8* 5.0  HGB 13.1 11.4*  HCT 41.5 36.9*  MCV 87.0 88.9  PLT 221 171   Basic Metabolic Panel: Recent Labs  Lab 01/18/19 1215 01/19/19 0500 01/19/19 1030  NA 139 139  --   K 3.6 3.7  --   CL 99 103  --   CO2 26 26  --   GLUCOSE 121* 98  --   BUN 25* 20  --   CREATININE 2.06* 1.36*  --   CALCIUM 8.9 8.3*  --   MG  --   --  1.9   GFR: Estimated Creatinine Clearance: 67.5 mL/min (A) (by C-G formula based on SCr of 1.36 mg/dL (H)). Liver Function Tests: Recent Labs  Lab 01/19/19 0500  AST 15  ALT 20  ALKPHOS 59  BILITOT 0.7  PROT 5.7*  ALBUMIN 2.7*   No results for input(s): LIPASE, AMYLASE in the last 168 hours. No results for input(s): AMMONIA in the last 168 hours. Coagulation Profile: No results for input(s): INR, PROTIME in the last 168 hours. Cardiac Enzymes: No results for  input(s): CKTOTAL, CKMB, CKMBINDEX, TROPONINI in the last 168 hours. BNP (last 3 results) No results for input(s): PROBNP in the last 8760 hours. HbA1C: No results for input(s): HGBA1C in the last 72 hours. CBG: No results for input(s): GLUCAP in the last 168 hours. Lipid Profile: Recent Labs    01/18/19 1215  TRIG 173*   Thyroid Function Tests: No results for input(s): TSH, T4TOTAL, FREET4, T3FREE, THYROIDAB in the last 72 hours. Anemia Panel: Recent Labs    01/18/19 1215  FERRITIN 131   Sepsis Labs: Recent Labs  Lab 01/18/19 1215 01/18/19 1241 01/18/19 1811  PROCALCITON <0.10  --   --   LATICACIDVEN  --  2.0* 1.3    Recent Results (from the past 240 hour(s))  SARS CORONAVIRUS 2 (TAT 6-24 HRS) Nasopharyngeal Nasopharyngeal Swab     Status: Abnormal   Collection Time: 01/18/19 12:03 PM   Specimen: Nasopharyngeal Swab  Result Value Ref Range Status   SARS Coronavirus 2 POSITIVE (A) NEGATIVE Final    Comment: RESULT CALLED TO, READ BACK BY AND VERIFIED WITH: M.COFFEY RN [id number] 01/18/2019 MCCORMICK K (NOTE) SARS-CoV-2 target nucleic acids are DETECTED. The SARS-CoV-2 RNA is generally detectable in upper and lower respiratory specimens during the acute phase of infection. Positive results are indicative of the presence of SARS-CoV-2 RNA. Clinical correlation with patient history and other diagnostic information is  necessary to determine patient infection status. Positive results do not rule out bacterial infection or co-infection with other viruses.  The expected result is Negative. Fact Sheet for Patients: SugarRoll.be Fact Sheet for Healthcare Providers: https://www.woods-mathews.com/ This test is not yet approved or cleared by the [place] FDA and  has been authorized for detection and/or diagnosis of SARS-CoV-2 by FDA under an Emergency Use Authorization (EUA). This EUA will remain  in effect (meaning this test can be  used) for  the duration of the COVID-19 declaration under Section 564(b)(1) of the Act, 21 U.S.C. section 360bbb-3(b)(1), unless the authorization is terminated or revoked sooner. Performed at [facility], [place] [address], [place], [place] [address]   Blood Culture (routine x 2)     Status: None (Preliminary result)   Collection Time: 01/18/19 12:03 PM   Specimen: BLOOD  Result Value Ref Range Status   Specimen Description BLOOD RIGHT ANTECUBITAL  Final   Special Requests   Final    BOTTLES DRAWN AEROBIC AND ANAEROBIC Blood Culture adequate volume   Culture   Final    NO GROWTH < 24 HOURS Performed at [facility], [place] [address] [place], [place] [address]    Report Status PENDING  Incomplete  Blood Culture (routine x 2)     Status: None (Preliminary result)   Collection Time: 01/18/19 12:08 PM   Specimen: BLOOD  Result Value Ref Range Status   Specimen Description BLOOD BLOOD RIGHT HAND  Final   Special Requests   Final    BOTTLES DRAWN AEROBIC AND ANAEROBIC Blood Culture adequate volume   Culture   Final    NO GROWTH < 24 HOURS Performed at [facility], [place] [address], [place], [place] [address]    Report Status PENDING  Incomplete         Radiology Studies: CT HEAD WO CONTRAST  Result Date: 01/19/2019 CLINICAL DATA:  68 year old male with focal neurologic deficit. EXAM: CT HEAD WITHOUT CONTRAST TECHNIQUE: Contiguous axial images were obtained from the base of the skull through the vertex without intravenous contrast. COMPARISON:  Head CT dated 07/10/2012. FINDINGS: Brain: The ventricles and sulci are appropriate in size for the patient's age. Minimal periventricular and deep white matter chronic microvascular ischemic changes noted. There is no acute intracranial hemorrhage. No mass effect or midline shift. No extra-axial fluid collection. Vascular: No hyperdense vessel or unexpected calcification. Skull: Normal. Negative for  fracture or focal lesion.  Sinuses/Orbits: Complete opacification of the left maxillary sinus as well as partial opacification of the sphenoid sinuses. No air-fluid level. The mastoid air cells are clear. Other: None IMPRESSION: No acute intracranial pathology. Electronically Signed   By: Elgie CollardArash  Radparvar M.D.   On: 01/19/2019 16:54   DG Chest Portable 1 View  Result Date: 01/18/2019 CLINICAL DATA:  COVID, shortness of breath. EXAM: PORTABLE CHEST 1 VIEW COMPARISON:  Chest radiograph 12/26/2018 FINDINGS: Shallow inspiration radiograph, accentuating the cardiac silhouette and resulting in crowding of the central bronchovascular markings. Similar appearance of patchy bilateral airspace opacities as compared to chest radiograph 01/05/2019. No sizable pleural effusion or evidence of pneumothorax. No acute bony abnormality. Overlying cardiac monitoring leads. IMPRESSION: Similar appearance of patchy bilateral airspace opacities as compared to 01/05/2019. The findings are consistent with multifocal pneumonia in the appropriate clinical setting. Electronically Signed   By: Jackey LogeKyle  Golden DO   On: 01/18/2019 15:14        Scheduled Meds: . allopurinol  100 mg Oral BID  . pantoprazole  40 mg Oral BID  . sertraline  200 mg Oral q morning - 10a  . sodium chloride flush  3 mL Intravenous Q12H   Continuous Infusions: . sodium chloride    . heparin 1,600 Units/hr (01/19/19 16100812)  . lactated ringers 100 mL/hr at 01/19/19 0200     LOS: 1 day    Time spent: 33 minutes with over 50% of the time coordinating the patient's care    Jae DireJared E Atwell Mcdanel, DO [facility] Pager [phone]  If 7PM-7AM, please contact night-coverage [email or web address] Password [email or web address] 01/19/2019, 5:26 PM

## 2019-01-19 NOTE — Progress Notes (Signed)
Patient was previously diagnosed and treated for Covid-19 on 12/6. He since improved and was discharged. Per CDC and [place] guidelines, no isolation is required and repeat tested is not recommended as many previously positive patients will continue to test positive for at least 3 months following first test.

## 2019-01-19 NOTE — Progress Notes (Signed)
ANTICOAGULATION CONSULT NOTE   Pharmacy Consult for heparin Indication: r/o VTE  No Known Allergies  Patient Measurements: Height: 5\' 10"  (177.8 cm) Weight: 279 lb (126.6 kg) IBW/kg (Calculated) : 73 Heparin Dosing Weight: 101.8kg  Vital Signs: BP: 125/82 (12/29 0813) Pulse Rate: 94 (12/29 0813)  Labs: Recent Labs    01/18/19 1215 01/19/19 0130 01/19/19 0500 01/19/19 1030  HGB 13.1  --  11.4*  --   HCT 41.5  --  36.9*  --   PLT 221  --  171  --   HEPARINUNFRC  --  0.66  --  0.46  CREATININE 2.06*  --  1.36*  --     Estimated Creatinine Clearance: 67.5 mL/min (A) (by C-G formula based on SCr of 1.36 mg/dL (H)).   Assessment: 61 yom presenting with SOB after recent COVID-19 infection continuing on heparin for r/o VTE. CBC is stable and d-dimer WNL. He is not on anticoagulation PTA. Confirmatory heparin level remains therapeutic. No active bleed issues documented.  Goal of Therapy:  Heparin level 0.3-0.7 units/ml Monitor platelets by anticoagulation protocol: Yes   Plan:  Continue heparin IV at 1600 units/hr Monitor daily heparin level and CBC, s/sx bleeding F/u CTA vs. V/Q   Elicia Lamp, PharmD, BCPS Please check AMION for all [place] contact numbers Clinical Pharmacist 01/19/2019 11:45 AM

## 2019-01-19 NOTE — Plan of Care (Signed)

## 2019-01-19 NOTE — Progress Notes (Signed)
ANTICOAGULATION CONSULT NOTE   Pharmacy Consult for heparin Indication: r/o VTE  No Known Allergies  Patient Measurements: Height: 5\' 10"  (177.8 cm) Weight: 279 lb (126.6 kg) IBW/kg (Calculated) : 73 Heparin Dosing Weight: 101.8kg  Vital Signs: BP: 128/78 (12/29 0400) Pulse Rate: 85 (12/29 0400)  Labs: Recent Labs    01/18/19 1215 01/19/19 0130  HGB 13.1  --   HCT 41.5  --   PLT 221  --   HEPARINUNFRC  --  0.66  CREATININE 2.06*  --     Estimated Creatinine Clearance: 44.6 mL/min (A) (by C-G formula based on SCr of 2.06 mg/dL (H)).   Assessment: 73 yom presented with SOB after recent COVID-19 infection. CBC and d-dimer are WNL. To start IV heparin for r/o VTE. He is not on anticoagulation PTA.   Heparin level therapeutic (0.66) on gtt at 1600 units/hr. No bleeding noted.  Goal of Therapy:  Heparin level 0.3-0.7 units/ml Monitor platelets by anticoagulation protocol: Yes   Plan:  Continue heparin gtt 1600 units/hr F/u 6 hr confirmatory heparin level F/u imaging   Sherlon Handing, PharmD, BCPS Please see amion for complete clinical pharmacist phone list 01/19/2019,4:35 AM

## 2019-01-20 DIAGNOSIS — N179 Acute kidney failure, unspecified: Secondary | ICD-10-CM | POA: Diagnosis present

## 2019-01-20 DIAGNOSIS — U071 COVID-19: Secondary | ICD-10-CM

## 2019-01-20 DIAGNOSIS — J320 Chronic maxillary sinusitis: Secondary | ICD-10-CM

## 2019-01-20 DIAGNOSIS — G4733 Obstructive sleep apnea (adult) (pediatric): Secondary | ICD-10-CM | POA: Diagnosis present

## 2019-01-20 DIAGNOSIS — J9601 Acute respiratory failure with hypoxia: Secondary | ICD-10-CM

## 2019-01-20 DIAGNOSIS — J069 Acute upper respiratory infection, unspecified: Secondary | ICD-10-CM

## 2019-01-20 DIAGNOSIS — I10 Essential (primary) hypertension: Secondary | ICD-10-CM

## 2019-01-20 DIAGNOSIS — J1289 Other viral pneumonia: Secondary | ICD-10-CM

## 2019-01-20 DIAGNOSIS — J329 Chronic sinusitis, unspecified: Secondary | ICD-10-CM | POA: Diagnosis present

## 2019-01-20 DIAGNOSIS — N1831 Chronic kidney disease, stage 3a: Secondary | ICD-10-CM

## 2019-01-20 HISTORY — DX: Acute kidney failure, unspecified: N17.9

## 2019-01-20 LAB — COMPREHENSIVE METABOLIC PANEL
ALT: 19 U/L (ref 0–44)
AST: 15 U/L (ref 15–41)
Albumin: 2.9 g/dL — ABNORMAL LOW (ref 3.5–5.0)
Alkaline Phosphatase: 61 U/L (ref 38–126)
Anion gap: 10 (ref 5–15)
BUN: 21 mg/dL (ref 8–23)
CO2: 26 mmol/L (ref 22–32)
Calcium: 8.5 mg/dL — ABNORMAL LOW (ref 8.9–10.3)
Chloride: 103 mmol/L (ref 98–111)
Creatinine, Ser: 1.04 mg/dL (ref 0.61–1.24)
GFR calc Af Amer: >60 mL/min (ref >60)
GFR calc non Af Amer: >60 mL/min (ref >60)
Glucose, Bld: 90 mg/dL (ref 70–99)
Potassium: 3.9 mmol/L (ref 3.5–5.1)
Sodium: 139 mmol/L (ref 135–145)
Total Bilirubin: 0.8 mg/dL (ref 0.3–1.2)
Total Protein: 6.1 g/dL — ABNORMAL LOW (ref 6.5–8.1)

## 2019-01-20 LAB — CBC WITH DIFFERENTIAL/PLATELET
Abs Immature Granulocytes: 0.02 10*3/uL (ref 0.00–0.07)
Basophils Absolute: 0 10*3/uL (ref 0.0–0.1)
Basophils Relative: 1 %
Eosinophils Absolute: 0.5 10*3/uL (ref 0.0–0.5)
Eosinophils Relative: 7 %
HCT: 35 % — ABNORMAL LOW (ref 39.0–52.0)
Hemoglobin: 11 g/dL — ABNORMAL LOW (ref 13.0–17.0)
Immature Granulocytes: 0 %
Lymphocytes Relative: 11 %
Lymphs Abs: 0.8 10*3/uL (ref 0.7–4.0)
MCH: 27.9 pg (ref 26.0–34.0)
MCHC: 31.4 g/dL (ref 30.0–36.0)
MCV: 88.8 fL (ref 80.0–100.0)
Monocytes Absolute: 0.6 10*3/uL (ref 0.1–1.0)
Monocytes Relative: 8 %
Neutro Abs: 5.3 10*3/uL (ref 1.7–7.7)
Neutrophils Relative %: 73 %
Platelets: 150 10*3/uL (ref 150–400)
RBC: 3.94 MIL/uL — ABNORMAL LOW (ref 4.22–5.81)
RDW: 14.8 % (ref 11.5–15.5)
WBC: 7.2 10*3/uL (ref 4.0–10.5)
nRBC: 0 % (ref 0.0–0.2)

## 2019-01-20 LAB — D-DIMER, QUANTITATIVE: D-Dimer, Quant: 0.47 ug{FEU}/mL (ref 0.00–0.50)

## 2019-01-20 LAB — MAGNESIUM: Magnesium: 2 mg/dL (ref 1.7–2.4)

## 2019-01-20 LAB — PHOSPHORUS: Phosphorus: 1.7 mg/dL — ABNORMAL LOW (ref 2.5–4.6)

## 2019-01-20 LAB — FERRITIN: Ferritin: 126 ng/mL (ref 24–336)

## 2019-01-20 LAB — C-REACTIVE PROTEIN: CRP: 6.8 mg/dL — ABNORMAL HIGH (ref <1.0)

## 2019-01-20 MED ORDER — AMOXICILLIN-POT CLAVULANATE 875-125 MG PO TABS
1.0000 | ORAL_TABLET | Freq: Two times a day (BID) | ORAL | Status: DC
  Administered 2019-01-20 – 2019-01-23 (×6): 1 via ORAL
  Filled 2019-01-20 (×7): qty 1

## 2019-01-20 MED ORDER — AMLODIPINE BESYLATE 5 MG PO TABS
2.5000 mg | ORAL_TABLET | Freq: Every morning | ORAL | Status: DC
  Administered 2019-01-20 – 2019-01-21 (×2): 2.5 mg via ORAL
  Filled 2019-01-20 (×3): qty 1

## 2019-01-20 MED ORDER — LISINOPRIL 10 MG PO TABS
10.0000 mg | ORAL_TABLET | Freq: Every day | ORAL | Status: DC
  Administered 2019-01-20 – 2019-01-21 (×2): 10 mg via ORAL
  Filled 2019-01-20 (×2): qty 1

## 2019-01-20 MED ORDER — SODIUM PHOSPHATES 45 MMOLE/15ML IV SOLN
30.0000 mmol | Freq: Once | INTRAVENOUS | Status: AC
  Administered 2019-01-20: 17:00:00 30 mmol via INTRAVENOUS
  Filled 2019-01-20: qty 10

## 2019-01-20 MED ORDER — PREDNISONE 20 MG PO TABS
50.0000 mg | ORAL_TABLET | Freq: Two times a day (BID) | ORAL | Status: DC
  Administered 2019-01-20 – 2019-01-21 (×3): 50 mg via ORAL
  Filled 2019-01-20 (×3): qty 1

## 2019-01-20 MED ORDER — AMLODIPINE BESYLATE 5 MG PO TABS: 2.5000 mg | ORAL_TABLET | Freq: Every morning | ORAL | Status: DC

## 2019-01-20 MED ORDER — LISINOPRIL 20 MG PO TABS: 40.0000 mg | ORAL_TABLET | Freq: Every morning | ORAL | Status: DC

## 2019-01-20 MED ORDER — FLUTICASONE PROPIONATE 50 MCG/ACT NA SUSP
2.0000 | Freq: Every day | NASAL | Status: DC
  Administered 2019-01-20 – 2019-01-23 (×4): 2 via NASAL
  Filled 2019-01-20: qty 16

## 2019-01-20 MED ORDER — OXYMETAZOLINE HCL 0.05 % NA SOLN
1.0000 | Freq: Two times a day (BID) | NASAL | Status: AC
  Administered 2019-01-20 – 2019-01-23 (×6): 1 via NASAL
  Filled 2019-01-20: qty 15

## 2019-01-20 NOTE — Progress Notes (Signed)
OT Evaluation  Familiar with pt from previous admission. PTA, pt lived alone at home and has been completing ADL and IADL tasks, but requires frequent breaks due to shortness of breath. Pt states his oxygen drops into the 70s when walking. Pt ambulated to the bathroom, completed ADL in standing x 15 minutes then ambulated to recliner. 2/5 DOE with lowest SpO2 85 on 2L. Quickly rebounds to low 90s with vc for pursed lip breathing. Pt states he is frustrated and doesn't understand why he is not better and wants to see a "lung specialist". Recommend HHOT. Will follow acutely. Recommend pt ambulate with nsg.     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  He does have significant past medical history for diastolic heart failure, hypertension, TIAs, and chronic kidney disease stage III.  Patient was diagnosed with COVID-19 the day before Thanksgiving, November 25.   Patient was noted to be hypoxic.  He was hospitalized for further management.  Chest x-ray showed left upper lobe  infiltrate. On 12/11, had pnemothorax, now resolved.  Precautions  Precautions Fall  Home Living  Family/patient expects to be discharged to: Private residence  Living Arrangements Alone  Available Help at Discharge Family;Available PRN/intermittently  Type of Home House  Home Access Stairs to enter  Entrance Stairs-Number of Steps 7  Entrance Stairs-Rails Right;Left  Home Layout One level  Bathroom Biomedical scientist Yes  How Accessible Accessible via walker  Smock - 2 wheels;Cane - single point;Shower seat;Walker - 4 wheels (Oxygen - 2L at home)  Prior Function  Level of Independence Independent with assistive device(s)  Communication  Communication No difficulties  Pain Assessment  Pain Assessment 0-10  Pain  Score 7  Pain Location w/ deep breathing  Pain Descriptors / Indicators Aching;Discomfort  Pain Intervention(s) Limited activity within patient's tolerance  Cognition  Arousal/Alertness Awake/alert  Behavior During Therapy WFL for tasks assessed/performed  Overall Cognitive Status Within Functional Limits for tasks assessed  Upper Extremity Assessment  Upper Extremity Assessment Generalized weakness (B RTC isufficiency at [place])  Lower Extremity Assessment  Lower Extremity Assessment Defer to PT evaluation  LLE Sensation history of peripheral neuropathy  Cervical / Trunk Assessment  Cervical / Trunk Assessment Other exceptions  Cervical / Trunk Exceptions hx of back surgery  ADL  Functional mobility during ADLs Modified independent;Rolling walker  General ADL Comments able to complete basic ADL tasks. REquires frequent res breaks to complete due to SOB. Began re-educatin gpt on energy conservaiton strategies  Vision- History  Baseline Vision/History Wears glasses  Bed Mobility  Overal bed mobility Modified Independent  Transfers  Overall transfer level Modified independent  Balance  Overall balance assessment Mild deficits observed, not formally tested  Exercises  Exercises Other exercises  Other Exercises  Other Exercises completed incentive and flutter valve ex appropriately. Able to pull 1569ml  OT - End of Session  Equipment Utilized During Treatment Rolling walker;Oxygen (2L)  Activity Tolerance Patient tolerated treatment well  Patient left in chair;with call bell/phone within reach  Nurse Communication Mobility status  OT Assessment  OT Recommendation/Assessment Patient needs continued OT Services  OT Visit Diagnosis Pain;Muscle weakness (generalized) (M62.81)  Pain - part of body  (chest)  OT Problem List Cardiopulmonary status limiting activity;Obesity;Pain;Decreased activity tolerance  OT Plan  OT Frequency (ACUTE ONLY) Min 2X/week  OT  Treatment/Interventions (ACUTE ONLY) Self-care/ADL training;Therapeutic exercise;Neuromuscular education;Energy conservation;DME and/or AE instruction;Therapeutic activities;Patient/family education  AM-PAC OT "6 Clicks" Daily Activity Outcome Measure (Version 2)  Help from another person eating meals? 4  Help from another person taking care of personal grooming? 4  Help from another person toileting, which includes using toliet, bedpan, or urinal? 4  Help from another person bathing (including washing, rinsing, drying)? 3  Help from another person to put on and taking off regular upper body clothing? 4  Help from another person to put on and taking off regular lower body clothing? 3  6 Click Score 22  OT Recommendation  Follow Up Recommendations Home health OT;Supervision - Intermittent  OT Equipment None recommended by OT  Individuals Consulted  Consulted and Agree with Results and Recommendations Patient  Acute Rehab OT Goals  Patient Stated Goal to breath better  OT Goal Formulation With patient  Time For Goal Achievement 02/04/18  Potential to Achieve Goals Good  OT Time Calculation  OT Start Time (ACUTE ONLY) 0836  OT Stop Time (ACUTE ONLY) 0910  OT Time Calculation (min) 34 min  OT General Charges  $OT Visit 1 Visit  OT Evaluation  $OT Eval Moderate Complexity 1 Mod  OT Treatments  $Self Care/Home Management  8-22 mins  Written Expression  Dominant Hand Right  Luisa Dago, OT/L   Acute OT Clinical Specialist Acute Rehabilitation Services Pager [phone] Office [phone]

## 2019-01-20 NOTE — Evaluation (Signed)
Physical Therapy Evaluation Patient Details Name: Kurt [name] MRN: [id number] DOB: 08/03/1950 Today's Date: 01/20/2019   History of Present Illness  Kurt [name] is a 68 y.o. male with medical history significant of HTN; CHF; stage 3 CKD; and recent hospitalization for COVID-19 associated PNA presenting with SOB.  He was previously hospitalized from 12/6-16 for acute hypoxic respiratory failure due to COVID-19 PNA and completed steroids and remdesivir.  He was discharged on a Rollator with 2L [place] O2 to be used with exertion.  He was discharged to home and hasn't had any wind.  He would have to stop to rest going from bedroom to bath to kitchen.  He felt like he needed O2 constantly.  Clinical Impression  Very pleasant and cooperative patient presents with generalized weakness and reduced energy conservation. He had been using oxygen at home recently, but indicated that he often feels short of breath and depends on the oxygen at night. He lives alone, and was independent in all PLOF. Should benefit from skilled PT to address strengthening, gait safety and energy conservation.     Follow Up Recommendations Home health PT    Equipment Recommendations    RW   Recommendations for Other Services       Precautions / Restrictions Precautions Precautions: Fall Restrictions Weight Bearing Restrictions: No      Mobility  Bed Mobility Overal bed mobility: Modified Independent             General bed mobility comments: Does complain of mild shortness of breath in performing rolling to sit on edge of bed, or sit to supine.  Transfers Overall transfer level: Modified independent Equipment used: Rolling walker (2 wheeled) Transfers: Sit to/from Stand Sit to Stand: Min guard            Ambulation/Gait Ambulation/Gait assistance: Land (Feet): 24 Feet Assistive device: Rolling walker (2 wheeled) Gait Pattern/deviations: [employer] fast streamer    Modified Rankin (Stroke Patients Only)       Balance Overall balance assessment: Modified Independent         Standing balance support: Bilateral upper extremity supported Standing balance-Leahy Scale: Good                               Pertinent Vitals/Pain Pain Assessment: (P) 0-10 Pain Score: (P) 7 -related to sinus discomfort    Home Living Family/patient expects to be discharged to:: Private residence Living Arrangements: Alone Available Help at Discharge: (P) Family;Available PRN/intermittently Type of Home: (P) House Home Access: Stairs to enter(7) Entrance Stairs-Rails: (P) Right;Left Entrance Stairs-Number of Steps: 7 with one handrail in midline Home Layout: One level Home Equipment: Grab bars - tub/shower      Prior Function Level of Independence: Independent               Hand Dominance   Dominant Hand: Right    Extremity/Trunk Assessment   Upper Extremity Assessment Upper Extremity Assessment: Defer to OT evaluation    Lower Extremity Assessment Lower Extremity Assessment: Defer to PT evaluation;Overall [facility] for tasks assessed;Generalized weakness RLE Sensation: WNL LLE Sensation: WNL       Communication   Communication: No difficulties  Cognition  General Comments General comments (skin integrity, edema, etc.): Fairly good tone and turgor throughout trunk and LEs    Exercises General Exercises - Lower Extremity Ankle Circles/Pumps: AROM Quad Sets: AROM Gluteal Sets: AROM Short Arc Quad: AROM Long Arc Quad: AROM Hip Flexion/Marching: AROM Other Exercises Other Exercises: Reviewed incentive spirometer and flutter. Able to achieve 950-100 5/5 on IS   Assessment/Plan    PT Assessment Patient needs continued PT services  PT Problem List Decreased strength;Decreased activity tolerance;Cardiopulmonary status limiting activity        PT Treatment Interventions Therapeutic ex and gait with RW this session   PT Goals (Current goals can be found in the Care Plan section)  Acute Rehab PT Goals Patient Stated Goal: Just wants to return to home and be able to remain independent as in PLOF -"with least amount of O2 required to be comfortable" PT Goal Formulation: With patient Time For Goal Achievement: 02/03/19 Potential to Achieve Goals: Good    Frequency Min 3X/week   Barriers to discharge        Co-evaluation               AM-PAC PT "6 Clicks" Mobility  Outcome Measure Help needed turning from your back to your side while in a flat bed without using bedrails?: A Little Help needed moving from lying on your back to sitting on the side of a flat bed without using bedrails?: A Little Help needed moving to and from a bed to a chair (including a wheelchair)?: A Little Help needed standing up from a chair using your arms (e.g., wheelchair or bedside chair)?: A Little Help needed to walk in hospital room?: A Little Help needed climbing 3-5 steps with a railing? : A Lot 6 Click Score: 17    End of Session   Activity Tolerance: Patient tolerated treatment well Patient left: in chair Nurse Communication: Mobility status(RN present in room during part of session, and witnessed his standing, using RW) PT Visit Diagnosis: Muscle weakness (generalized) (M62.81);Other (comment)(reduced energy conservation- indicates shortness of breath with minmod exertion.)    Time: 0920-1010 PT Time Calculation (min) (ACUTE ONLY): 50 min   Charges:   PT Evaluation $PT Eval Moderate Complexity: 1 Mod PT Treatments $Gait Training: 8-22 mins $Therapeutic Exercise: 8-22 mins       Rollen Sox, PT # [phone] CGV cell }  Casandra Doffing 01/20/2019, 10:29 AM

## 2019-01-20 NOTE — Progress Notes (Signed)
1900Report to Night Shift.  Pt has had left nare pain all day.  Pt has complained about the Dr all day.  Pt has notified family that we are inadequate as a hospital all day.  Pt has fussed about every attempt I gave to resolve his issue about the Left Nare Pain.  Notified the Dr several times, Pt given Prednisone which would hopefully help with the inflamed nares maybe? Pt weaned off of O2 which he really fussed about  Said yes it dries his nose but he cant breathe without it.  Pt has been given 2 diff types of nose spray.  Pt has been given PO pain medicaton, pt has been offered a warm compress to the left nare.  Pt Bp became elevated this afternoon he hated this place so bad.  Originally this am pt states that he is pissed due to the Dr coming in and realizing I had even had a CT done.  Per the pt that made him lose respect for that Physician.  I notified Dr Sherral Hammers that pt was very upset.  No new orders received.  Pt calls me in the room while giving report and states that he wants me to see what came out of his nose.  I showed it to the night nurse It looked like a green bloody oyster Meaty oyster.  Pt then thanked me for my efforts today.  Glad pt is relieved but he was very difficult today

## 2019-01-20 NOTE — Progress Notes (Signed)
PROGRESS NOTE    Kurt [name]  ZOX:[id number] DOB: [birth year] DOA: 01/18/2019 PCP: Kaleen Mask, MD   Brief Narrative:  68 y.o.WM PMHx HTN; CHF?; CKD stage 3 ;   and recent hospitalization for COVID-19 associated PNA presenting with SOB. He was previously hospitalized from 12/6-16 for acute hypoxic respiratory failure due to COVID-19 PNA and completed steroids and remdesivir. He was discharged on a Rollator with 2L [place] O2 to be used with exertion. He was discharged to home and hasn't had any wind. He would have to stop to rest going from bedroom to bath to kitchen. He felt like he needed O2 constantly. It felt harder to breathe. If he wasn't wearing O2 his sats would fall to the 70s. His left hand feels numb. He has pleuritic pain in his chest and side with deep breaths. He went to his PCP today and they sent him back to the ER. He hasn't had much of an appetite, isn't eating much but has been drinking water, tea, coffee.   ED Course:Patient diagnosed about Thanksgiving, d/c on 12/16, doing better. Worsening SOB, 70s on RA. Looks more comfortable on 2L [place] O2. Concern for PE - most other symptoms better but SOB feels worse than prior. Sometimes increases to 4L [place] O2. Mild renal failure. BP 80s, improved with IVF.  Started on empiric heparin for suspected PE. Noted to be on 6 L 12/29. Transferred to [facility] initially on 12/29, however patient was suspected to be non infectious due to COVID diagnosis >21 days ago and here with suspected PE. Plan for transfer back to [facility] for further management.     Subjective: A/O x4, negative CP, negative abdominal pain.  Positive left facial pain, positive S OB.  States has had witnessed episodes of apnea   Assessment & Plan:   Principal Problem:   Acute on chronic respiratory failure with hypoxia (HCC) Active Problems:   Hypertension   Acute kidney injury superimposed on CKD (HCC)   Pneumonia due to COVID-19 virus   Acute  respiratory failure with hypoxia (HCC)   CKD (chronic kidney disease) stage 3, GFR 30-59 ml/min   Failure to thrive in adult   Major depression, chronic   Obesity, Class III, BMI 40-49.9 (morbid obesity) (HCC)   OSA (obstructive sleep apnea)   Acute renal failure superimposed on stage 3a chronic kidney disease (HCC)   Chronic sinusitis  Covid pneumonia/acute respiratory failure with hypoxia COVID-19 Labs  Recent Labs    01/18/19 1215 01/19/19 0130 01/20/19 1000  DDIMER 0.47 0.40 0.47  FERRITIN 131  --  126  LDH 146  --   --   CRP 3.3*  --  6.8*    Lab Results  Component Value Date   SARSCOV2NAA POSITIVE (A) 01/18/2019   SARSCOV2NAA NEGATIVE 06/15/2018  -Patient with cavitary lesions bilateral upper lobe see results CT scan below -Prednisone 50 mg BID.  Will give patient short high-dose burst of steroids.  May require long taper when discharged considering damage in his lungs.  OSA undiagnosed -Patient with witnessed apneic events will require sleep study upon discharge.  Chronic sinusitis -Afrin x3 days -Flonase BID -Augmentin x21 days  Essential HTN -Amlodipine 2.5 mg daily -Lisinopril 10 mg daily (home dose 40 mg daily  Acute on CKD stage IIIa (baseline Cr` 1.23) Recent Labs  Lab 01/18/19 1215 01/19/19 0500 01/20/19 0150  CREATININE 2.06* 1.36* 1.04  -Back to baseline  Hypophosphatemia -Sodium phosphate 30 mmol    DVT prophylaxis: Lovenox Code  Status: Full Family Communication:  Disposition Plan:    Consultants:    Procedures/Significant Events:  10/29 CT head Wo contrast;-negative acute intracranial pathology -Sinuses/Orbits: Complete opacification of the left maxillary sinus as well as partial opacification of the sphenoid sinuses. No air-fluid level. The mastoid air cells are clear. 10/29 CTA chest PE protocol;-negative PE  -Scattered peripheral predominant irregular ground-glass densities and nodular consolidation with pleuroparenchymal  scarring, favored to reflect sequelae of COVID-19 pneumonia.  -Two cavitary nodules in both upper lobes measuring up to 2.5 cm, which can occur with COVID-19 infection. The differential also includes bacterial or fungal pneumonia. .   I have personally reviewed and interpreted all radiology studies and my findings are as above.  VENTILATOR SETTINGS:    Cultures   Antimicrobials: Anti-infectives (From admission, onward)   Start     Dose/Rate Route Frequency Ordered Stop   01/20/19 1630  amoxicillin-clavulanate (AUGMENTIN) 875-125 MG per tablet 1 tablet     1 tablet Oral Every 12 hours 01/20/19 1617         Devices   LINES / TUBES:      Continuous Infusions:  sodium chloride     lactated ringers 100 mL/hr at 01/20/19 1300   sodium phosphate  Dextrose 5% IVPB       Objective: Vitals:   01/20/19 0500 01/20/19 0900 01/20/19 1012 01/20/19 1200  BP:  125/76 125/73 (!) 144/79  Pulse:   72 78  Resp:  18    Temp:  97.9 F (36.6 C)    TempSrc:  Oral  Oral  SpO2:  96% 98% 97%  Weight: 126 kg     Height:        Intake/Output Summary (Last 24 hours) at 01/20/2019 1637 Last data filed at 01/20/2019 1300 Gross per 24 hour  Intake 616 ml  Output 1000 ml  Net -384 ml   Filed Weights   01/18/19 1600 01/20/19 0500  Weight: 126.6 kg 126 kg    Examination:  General: A/O x4, positive acute respiratory distress Eyes: negative scleral hemorrhage, negative anisocoria, negative icterus pain to palpation left cheek under eye and traversing back toward left ear. ENT: Negative Runny nose, negative gingival bleeding, Neck:  Negative scars, masses, torticollis, lymphadenopathy, JVD Lungs: Diffuse decreased breath sounds without wheezes or crackles Cardiovascular: Regular rate and rhythm without murmur gallop or rub normal S1 and S2 Abdomen: negative abdominal pain, nondistended, positive soft, bowel sounds, no rebound, no ascites, no appreciable mass Extremities: No  significant cyanosis, clubbing, or edema bilateral lower extremities Skin: Negative rashes, lesions, ulcers Psychiatric:  Negative depression, negative anxiety, negative fatigue, negative mania  Central nervous system:  Cranial nerves II through XII intact, tongue/uvula midline, all extremities muscle strength 5/5, sensation intact throughout, negative dysarthria, negative expressive aphasia, negative receptive aphasia.  .     Data Reviewed: Care during the described time interval was provided by me .  I have reviewed this patient's available data, including medical history, events of note, physical examination, and all test results as part of my evaluation.   CBC: Recent Labs  Lab 01/18/19 1215 01/19/19 0500 01/20/19 0150  WBC 10.6* 7.3 7.2  NEUTROABS 8.8* 5.0 5.3  HGB 13.1 11.4* 11.0*  HCT 41.5 36.9* 35.0*  MCV 87.0 88.9 88.8  PLT 221 171 062   Basic Metabolic Panel: Recent Labs  Lab 01/18/19 1215 01/19/19 0500 01/19/19 1030 01/20/19 0150 01/20/19 1000  NA 139 139  --  139  --   K 3.6 3.7  --  3.9  --   CL 99 103  --  103  --   CO2 26 26  --  26  --   GLUCOSE 121* 98  --  90  --   BUN 25* 20  --  21  --   CREATININE 2.06* 1.36*  --  1.04  --   CALCIUM 8.9 8.3*  --  8.5*  --   MG  --   --  1.9  --  2.0  PHOS  --   --   --   --  1.7*   GFR: Estimated Creatinine Clearance: 88.1 mL/min (by C-G formula based on SCr of 1.04 mg/dL). Liver Function Tests: Recent Labs  Lab 01/19/19 0500 01/20/19 0150  AST 15 15  ALT 20 19  ALKPHOS 59 61  BILITOT 0.7 0.8  PROT 5.7* 6.1*  ALBUMIN 2.7* 2.9*   No results for input(s): LIPASE, AMYLASE in the last 168 hours. No results for input(s): AMMONIA in the last 168 hours. Coagulation Profile: No results for input(s): INR, PROTIME in the last 168 hours. Cardiac Enzymes: No results for input(s): CKTOTAL, CKMB, CKMBINDEX, TROPONINI in the last 168 hours. BNP (last 3 results) No results for input(s): PROBNP in the last 8760  hours. HbA1C: No results for input(s): HGBA1C in the last 72 hours. CBG: No results for input(s): GLUCAP in the last 168 hours. Lipid Profile: Recent Labs    01/18/19 1215  TRIG 173*   Thyroid Function Tests: No results for input(s): TSH, T4TOTAL, FREET4, T3FREE, THYROIDAB in the last 72 hours. Anemia Panel: Recent Labs    01/18/19 1215 01/20/19 1000  FERRITIN 131 126   Urine analysis:    Component Value Date/Time   COLORURINE YELLOW 06/15/2018 0350   APPEARANCEUR CLEAR 06/15/2018 0350   LABSPEC 1.014 06/15/2018 0350   PHURINE 5.0 06/15/2018 0350   GLUCOSEU NEGATIVE 06/15/2018 0350   HGBUR NEGATIVE 06/15/2018 0350   BILIRUBINUR NEGATIVE 06/15/2018 0350   KETONESUR NEGATIVE 06/15/2018 0350   PROTEINUR NEGATIVE 06/15/2018 0350   UROBILINOGEN 1.0 07/09/2012 0010   NITRITE NEGATIVE 06/15/2018 0350   LEUKOCYTESUR NEGATIVE 06/15/2018 0350   Sepsis Labs: @LABRCNTIP (procalcitonin:4,lacticidven:4)  ) Recent Results (from the past 240 hour(s))  SARS CORONAVIRUS 2 (TAT 6-24 HRS) Nasopharyngeal Nasopharyngeal Swab     Status: Abnormal   Collection Time: 01/18/19 12:03 PM   Specimen: Nasopharyngeal Swab  Result Value Ref Range Status   SARS Coronavirus 2 POSITIVE (A) NEGATIVE Final    Comment: RESULT CALLED TO, READ BACK BY AND VERIFIED WITH: M.COFFEY RN [id number] 01/18/2019 MCCORMICK K (NOTE) SARS-CoV-2 target nucleic acids are DETECTED. The SARS-CoV-2 RNA is generally detectable in upper and lower respiratory specimens during the acute phase of infection. Positive results are indicative of the presence of SARS-CoV-2 RNA. Clinical correlation with patient history and other diagnostic information is  necessary to determine patient infection status. Positive results do not rule out bacterial infection or co-infection with other viruses.  The expected result is Negative. Fact Sheet for Patients: HairSlick.nohttps://www.fda.gov/media/138098/download Fact Sheet for Healthcare  Providers: quierodirigir.comhttps://www.fda.gov/media/138095/download This test is not yet approved or cleared by the [place] FDA and  has been authorized for detection and/or diagnosis of SARS-CoV-2 by FDA under an Emergency Use Authorization (EUA). This EUA will remain  in effect (meaning this test can be used) for  the duration of the COVID-19 declaration under Section 564(b)(1) of the Act, 21 U.S.C. section 360bbb-3(b)(1), unless the authorization is terminated or revoked sooner. Performed at [facility], [address] [place], [place] [address]   Blood Culture (routine x 2)     Status: None (Preliminary result)   Collection Time: 01/18/19 12:03 PM   Specimen: BLOOD  Result Value Ref Range Status   Specimen Description BLOOD RIGHT ANTECUBITAL  Final   Special Requests   Final    BOTTLES DRAWN AEROBIC AND ANAEROBIC Blood Culture adequate volume   Culture   Final    NO GROWTH 2 DAYS Performed at [facility], [address], , [place] [address]    Report Status PENDING  Incomplete  Blood Culture (routine x 2)     Status: None (Preliminary result)   Collection Time: 01/18/19 12:08 PM   Specimen: BLOOD  Result Value Ref Range Status   Specimen Description BLOOD BLOOD RIGHT HAND  Final   Special Requests   Final    BOTTLES DRAWN AEROBIC AND ANAEROBIC Blood Culture adequate volume   Culture   Final    NO GROWTH 2 DAYS Performed at [facility], [address], [place], [place] [address]    Report Status PENDING  Incomplete         Radiology Studies: CT HEAD WO CONTRAST  Result Date: 01/19/2019 CLINICAL DATA:  68 year old male with focal neurologic deficit. EXAM: CT HEAD WITHOUT CONTRAST TECHNIQUE: Contiguous axial images were obtained from the base of the skull through the vertex without intravenous contrast. COMPARISON:  Head CT dated 07/10/2012. FINDINGS: Brain: The ventricles and sulci are appropriate in size for the patient's age. Minimal  periventricular and deep white matter chronic microvascular ischemic changes noted. There is no acute intracranial hemorrhage. No mass effect or midline shift. No extra-axial fluid collection. Vascular: No hyperdense vessel or unexpected calcification. Skull: Normal. Negative for fracture or focal lesion. Sinuses/Orbits: Complete opacification of the left maxillary sinus as well as partial opacification of the sphenoid sinuses. No air-fluid level. The mastoid air cells are clear. Other: None IMPRESSION: No acute intracranial pathology. Electronically Signed   By: Elgie Collard M.D.   On: 01/19/2019 16:54   CT ANGIO CHEST PE W OR WO CONTRAST  Result Date: 01/19/2019 CLINICAL DATA:  Shortness of breath.  COVID positive. EXAM: CT ANGIOGRAPHY CHEST WITH CONTRAST TECHNIQUE: Multidetector CT imaging of the chest was performed using the standard protocol during bolus administration of intravenous contrast. Multiplanar CT image reconstructions and MIPs were obtained to evaluate the vascular anatomy. CONTRAST:  16mL OMNIPAQUE IOHEXOL 350 MG/ML SOLN COMPARISON:  Chest x-ray from yesterday. CT chest dated September 09, 2017. FINDINGS: Cardiovascular: Satisfactory opacification of the pulmonary arteries to the segmental level. No evidence of pulmonary embolism. Normal heart size. No pericardial effusion. No thoracic aortic aneurysm or dissection. Mediastinum/Nodes: Mildly enlarged right paratracheal lymph node measuring 1.1 cm in short axis, likely reactive. No enlarged hilar or axillary lymph nodes. The thyroid gland, trachea, and esophagus demonstrate no significant findings. Lungs/Pleura: Scattered peripheral predominant irregular ground-glass densities and nodular consolidation with pleuroparenchymal scarring. 1.9 x 2.5 cm cavitary nodule in the right upper lobe. 9 x 9 mm cavitary nodule in the left upper lobe. No pleural effusion or pneumothorax. Upper Abdomen: No acute abnormality. Musculoskeletal: No chest wall  abnormality. No acute or significant osseous findings. Prior T10-T12 posterior decompression. Review of the MIP images confirms the above findings. IMPRESSION: 1. Scattered peripheral predominant irregular ground-glass densities and nodular consolidation with pleuroparenchymal scarring, favored to reflect sequelae of COVID-19 pneumonia. Two cavitary nodules in both upper lobes measuring up to  2.5 cm, which can occur with COVID-19 infection. The differential also includes bacterial or fungal pneumonia. Septic pulmonary emboli are thought to be less likely. 2. No evidence of pulmonary embolism. Electronically Signed   By: Obie Dredge M.D.   On: 01/19/2019 17:26        Scheduled Meds:  allopurinol  100 mg Oral BID   amLODipine  2.5 mg Oral q morning - 10a   amoxicillin-clavulanate  1 tablet Oral Q12H   enoxaparin (LOVENOX) injection  40 mg Subcutaneous Q24H   fluticasone  2 spray Each Nare Daily   lisinopril  10 mg Oral Daily   oxymetazoline  1 spray Each Nare BID   pantoprazole  40 mg Oral BID   predniSONE  50 mg Oral BID WC   sertraline  200 mg Oral q morning - 10a   sodium chloride flush  3 mL Intravenous Q12H   Continuous Infusions:  sodium chloride     lactated ringers 100 mL/hr at 01/20/19 1300   sodium phosphate  Dextrose 5% IVPB       LOS: 2 days   The patient is critically ill with multiple organ systems failure and requires high complexity decision making for assessment and support, frequent evaluation and titration of therapies, application of advanced monitoring technologies and extensive interpretation of multiple databases. Critical Care Time devoted to patient care services described in this note  Time spent: 40 minutes     Murvin Gift, Roselind Messier, MD [facility] Pager [phone]  If 7PM-7AM, please contact night-coverage [email or web address] Password [facility] 01/20/2019, 4:37 PM

## 2019-01-21 DIAGNOSIS — E65 Localized adiposity: Secondary | ICD-10-CM

## 2019-01-21 LAB — CBC WITH DIFFERENTIAL/PLATELET
Abs Immature Granulocytes: 0.02 10*3/uL (ref 0.00–0.07)
Basophils Absolute: 0 10*3/uL (ref 0.0–0.1)
Basophils Relative: 0 %
Eosinophils Absolute: 0 10*3/uL (ref 0.0–0.5)
Eosinophils Relative: 0 %
HCT: 37.3 % — ABNORMAL LOW (ref 39.0–52.0)
Hemoglobin: 11.4 g/dL — ABNORMAL LOW (ref 13.0–17.0)
Immature Granulocytes: 0 %
Lymphocytes Relative: 3 %
Lymphs Abs: 0.3 10*3/uL — ABNORMAL LOW (ref 0.7–4.0)
MCH: 27.5 pg (ref 26.0–34.0)
MCHC: 30.6 g/dL (ref 30.0–36.0)
MCV: 89.9 fL (ref 80.0–100.0)
Monocytes Absolute: 0.1 10*3/uL (ref 0.1–1.0)
Monocytes Relative: 1 %
Neutro Abs: 7.5 10*3/uL (ref 1.7–7.7)
Neutrophils Relative %: 96 %
Platelets: 155 10*3/uL (ref 150–400)
RBC: 4.15 MIL/uL — ABNORMAL LOW (ref 4.22–5.81)
RDW: 14.7 % (ref 11.5–15.5)
WBC: 7.9 10*3/uL (ref 4.0–10.5)
nRBC: 0 % (ref 0.0–0.2)

## 2019-01-21 LAB — COMPREHENSIVE METABOLIC PANEL
ALT: 19 U/L (ref 0–44)
AST: 15 U/L (ref 15–41)
Albumin: 3 g/dL — ABNORMAL LOW (ref 3.5–5.0)
Alkaline Phosphatase: 65 U/L (ref 38–126)
Anion gap: 10 (ref 5–15)
BUN: 15 mg/dL (ref 8–23)
CO2: 25 mmol/L (ref 22–32)
Calcium: 8.6 mg/dL — ABNORMAL LOW (ref 8.9–10.3)
Chloride: 102 mmol/L (ref 98–111)
Creatinine, Ser: 1.03 mg/dL (ref 0.61–1.24)
GFR calc Af Amer: >60 mL/min (ref >60)
GFR calc non Af Amer: >60 mL/min (ref >60)
Glucose, Bld: 147 mg/dL — ABNORMAL HIGH (ref 70–99)
Potassium: 4.4 mmol/L (ref 3.5–5.1)
Sodium: 137 mmol/L (ref 135–145)
Total Bilirubin: 0.4 mg/dL (ref 0.3–1.2)
Total Protein: 6.2 g/dL — ABNORMAL LOW (ref 6.5–8.1)

## 2019-01-21 LAB — C-REACTIVE PROTEIN: CRP: 13.2 mg/dL — ABNORMAL HIGH (ref <1.0)

## 2019-01-21 LAB — MAGNESIUM: Magnesium: 2 mg/dL (ref 1.7–2.4)

## 2019-01-21 LAB — D-DIMER, QUANTITATIVE: D-Dimer, Quant: 0.44 ug/mL-FEU (ref 0.00–0.50)

## 2019-01-21 LAB — FERRITIN: Ferritin: 132 ng/mL (ref 24–336)

## 2019-01-21 LAB — PHOSPHORUS: Phosphorus: 3.3 mg/dL (ref 2.5–4.6)

## 2019-01-21 MED ORDER — PREDNISONE 20 MG PO TABS
50.0000 mg | ORAL_TABLET | Freq: Every day | ORAL | Status: DC
  Administered 2019-01-22 – 2019-01-23 (×2): 50 mg via ORAL
  Filled 2019-01-21 (×2): qty 1

## 2019-01-21 MED ORDER — LISINOPRIL 10 MG PO TABS
5.0000 mg | ORAL_TABLET | Freq: Once | ORAL | Status: AC
  Administered 2019-01-21: 5 mg via ORAL
  Filled 2019-01-21: qty 1

## 2019-01-21 MED ORDER — LISINOPRIL 10 MG PO TABS
15.0000 mg | ORAL_TABLET | Freq: Every day | ORAL | Status: DC
  Filled 2019-01-21: qty 2

## 2019-01-21 NOTE — Progress Notes (Signed)
Patient diastolic blood pressure elevated, no PRN available currently, will reassess and monitor.   Message sent to attending via secure chat and night MD via [place]. Awaiting further orders or intervention

## 2019-01-21 NOTE — Plan of Care (Signed)

## 2019-01-21 NOTE — Plan of Care (Signed)
Patient updated family on plan of care and progress. Pt reports relief from sinusitis and has no complaints. Pt up in chair satting in the 90s on baseline 2L [place].  Problem: Education: Goal: Knowledge of General Education information will improve Description: Including pain rating scale, medication(s)/side effects and non-pharmacologic comfort measures Outcome: Progressing   Problem: Health Behavior/Discharge Planning: Goal: Ability to manage health-related needs will improve Outcome: Progressing   Problem: Clinical Measurements: Goal: Ability to maintain clinical measurements within normal limits will improve Outcome: Progressing Goal: Will remain free from infection Outcome: Progressing Goal: Diagnostic test results will improve Outcome: Progressing Goal: Respiratory complications will improve Outcome: Progressing Goal: Cardiovascular complication will be avoided Outcome: Progressing   Problem: Activity: Goal: Risk for activity intolerance will decrease Outcome: Progressing   Problem: Nutrition: Goal: Adequate nutrition will be maintained Outcome: Progressing   Problem: Coping: Goal: Level of anxiety will decrease Outcome: Progressing   Problem: Elimination: Goal: Will not experience complications related to bowel motility Outcome: Progressing Goal: Will not experience complications related to urinary retention Outcome: Progressing   Problem: Pain Managment: Goal: General experience of comfort will improve Outcome: Progressing   Problem: Safety: Goal: Ability to remain free from injury will improve Outcome: Progressing   Problem: Skin Integrity: Goal: Risk for impaired skin integrity will decrease Outcome: Progressing

## 2019-01-21 NOTE — Progress Notes (Signed)
PROGRESS NOTE    Kurt [name]  MGQ:[id number] DOB: 01/15/1951 DOA: 01/18/2019 PCP: Leonard Downing, MD   Brief Narrative:  68 y.o.WM PMHx HTN; CHF?; CKD stage 3 ;   and recent hospitalization for COVID-19 associated PNA presenting with SOB. He was previously hospitalized from 12/6-16 for acute hypoxic respiratory failure due to COVID-19 PNA and completed steroids and remdesivir. He was discharged on a Rollator with 2L [place] O2 to be used with exertion. He was discharged to home and hasn't had any wind. He would have to stop to rest going from bedroom to bath to kitchen. He felt like he needed O2 constantly. It felt harder to breathe. If he wasn't wearing O2 his sats would fall to the 70s. His left hand feels numb. He has pleuritic pain in his chest and side with deep breaths. He went to his PCP today and they sent him back to the ER. He hasn't had much of an appetite, isn't eating much but has been drinking water, tea, coffee.   ED Course:Patient diagnosed about Thanksgiving, d/c on 12/16, doing better. Worsening SOB, 70s on RA. Looks more comfortable on 2L [place] O2. Concern for PE - most other symptoms better but SOB feels worse than prior. Sometimes increases to 4L [place] O2. Mild renal failure. BP 80s, improved with IVF.  Started on empiric heparin for suspected PE. Noted to be on 6 L 12/29. Transferred to [place] initially on 12/29, however patient was suspected to be non infectious due to COVID diagnosis >21 days ago and here with suspected PE. Plan for transfer back to [facility] for further management.     Subjective: 12/31/last 24 hours afebrile A/O x4, negative CP, negative abdominal pain.  States LEFT facial pain improved large significant thick bloody drainage obtained after using Flonase and Afrin.  States was informed by nursing had approximately 20 apneic episodes overnight   Assessment & Plan:   Principal Problem:   Acute on chronic respiratory failure with hypoxia  (HCC) Active Problems:   Hypertension   Acute kidney injury superimposed on CKD ([place])   Pneumonia due to COVID-19 virus   Acute respiratory failure with hypoxia (HCC)   CKD (chronic kidney disease) stage 3, GFR 30-59 ml/min   Failure to thrive in adult   Major depression, chronic   Obesity, Class III, BMI 40-49.9 (morbid obesity) (HCC)   OSA (obstructive sleep apnea)   Acute renal failure superimposed on stage 3a chronic kidney disease (HCC)   Chronic sinusitis  Covid pneumonia/acute respiratory failure with hypoxia COVID-19 Labs  Recent Labs    01/19/19 0130 01/20/19 1000 01/21/19 0200  DDIMER 0.40 0.47 0.44  FERRITIN  --  126 132  CRP  --  6.8* 13.2*    Lab Results  Component Value Date   SARSCOV2NAA POSITIVE (A) 01/18/2019   [place] NEGATIVE 06/15/2018  -Patient with cavitary lesions bilateral upper lobe see results CT scan below -Prednisone 50 mg BID.  Will give patient short high-dose burst of steroids.  May require long taper when discharged considering damage in his lungs. -12/31 decrease Prednisone 50 mg daily.  At discharge will place on slow taper  OSA/OHS undiagnosed -Patient with witnessed apneic events will require sleep study upon discharge.  Chronic sinusitis -Afrin x3 days -Flonase BID -Augmentin x21 days  Essential HTN -Amlodipine 2.5 mg daily -12/31 increase lisinopril 15 mg daily (home dose 40 mg daily) -12/31 lisinopril 5 mg x 1 dose  Acute on CKD stage IIIa (baseline Cr` 1.23) Recent  Labs  Lab 01/18/19 1215 01/19/19 0500 01/20/19 0150 01/21/19 0200  CREATININE 2.06* 1.36* 1.04 1.03  -Back to baseline  Hypophosphatemia -Sodium phosphate 30 mmol  Obese -12/31 nutrition consult; good nutrition habits/weight loss    DVT prophylaxis: Lovenox Code Status: Full Family Communication:  Disposition Plan:    Consultants:    Procedures/Significant Events:  10/29 CT head Wo contrast;-negative acute intracranial  pathology -Sinuses/Orbits: Complete opacification of the left maxillary sinus as well as partial opacification of the sphenoid sinuses. No air-fluid level. The mastoid air cells are clear. 10/29 CTA chest PE protocol;-negative PE  -Scattered peripheral predominant irregular ground-glass densities and nodular consolidation with pleuroparenchymal scarring, favored to reflect sequelae of COVID-19 pneumonia.  -Two cavitary nodules in both upper lobes measuring up to 2.5 cm, which can occur with COVID-19 infection. The differential also includes bacterial or fungal pneumonia. .   I have personally reviewed and interpreted all radiology studies and my findings are as above.  VENTILATOR SETTINGS: Nasal cannula 12/31 Flow; 2 L/min SPO2; 92%   Cultures   Antimicrobials: Anti-infectives (From admission, onward)   Start     Dose/Rate Route Frequency Ordered Stop   01/20/19 1630  amoxicillin-clavulanate (AUGMENTIN) 875-125 MG per tablet 1 tablet     1 tablet Oral Every 12 hours 01/20/19 1617         Devices   LINES / TUBES:      Continuous Infusions: . sodium chloride    . lactated ringers 100 mL/hr at 01/21/19 0645     Objective: Vitals:   01/21/19 0400 01/21/19 0500 01/21/19 0743 01/21/19 1241  BP: (!) 143/78  (!) 141/67 132/72  Pulse: (!) 55  91 89  Resp: Temp: 97.6 F (36.4 C)  98.2 F (36.8 C) 97.8 F (36.6 C)  TempSrc: Oral  Oral Oral  SpO2: 93%  93% 92%  Weight:  123.7 kg    Height:        Intake/Output Summary (Last 24 hours) at 01/21/2019 1313 Last data filed at 01/21/2019 1000 Gross per 24 hour  Intake 240 ml  Output 600 ml  Net -360 ml   Filed Weights   01/18/19 1600 01/20/19 0500 01/21/19 0500  Weight: 126.6 kg 126 kg 123.7 kg   Physical Exam:  General: A/O x4, positive acute respiratory distress Eyes: negative scleral hemorrhage, negative anisocoria, negative icterus ENT: Negative Runny nose, negative gingival bleeding, Neck:   Negative scars, masses, torticollis, lymphadenopathy, JVD Lungs: Decreased breath sounds bilaterally without wheezes or crackles Cardiovascular: Regular rate and rhythm without murmur gallop or rub normal S1 and S2 Abdomen: OBESE negative abdominal pain, nondistended, positive soft, bowel sounds, no rebound, no ascites, no appreciable mass Extremities: No significant cyanosis, clubbing, or edema bilateral lower extremities Skin: Negative rashes, lesions, ulcers Psychiatric:  Negative depression, negative anxiety, negative fatigue, negative mania  Central nervous system:  Cranial nerves II through XII intact, tongue/uvula midline, all extremities muscle strength 5/5, sensation intact throughout, negative dysarthria, negative expressive aphasia, negative receptive aphasia.   .     Data Reviewed: Care during the described time interval was provided by me .  I have reviewed this patient's available data, including medical history, events of note, physical examination, and all test results as part of my evaluation.   CBC: Recent Labs  Lab 01/18/19 1215 01/19/19 0500 01/20/19 0150 01/21/19 0200  WBC 10.6* 7.3 7.2 7.9  NEUTROABS 8.8* 5.0 5.3 7.5  HGB 13.1 11.4* 11.0* 11.4*  HCT 41.5  36.9* 35.0* 37.3*  MCV 87.0 88.9 88.8 89.9  PLT 221 171 150 155   Basic Metabolic Panel: Recent Labs  Lab 01/18/19 1215 01/19/19 0500 01/19/19 1030 01/20/19 0150 01/20/19 1000 01/21/19 0200  NA 139 139  --  139  --  137  K 3.6 3.7  --  3.9  --  4.4  CL 99 103  --  103  --  102  CO2 26 26  --  26  --  25  GLUCOSE 121* 98  --  90  --  147*  BUN 25* 20  --  21  --  15  CREATININE 2.06* 1.36*  --  1.04  --  1.03  CALCIUM 8.9 8.3*  --  8.5*  --  8.6*  MG  --   --  1.9  --  2.0 2.0  PHOS  --   --   --   --  1.7* 3.3   GFR: Estimated Creatinine Clearance: 88.1 mL/min (by C-G formula based on SCr of 1.03 mg/dL). Liver Function Tests: Recent Labs  Lab 01/19/19 0500 01/20/19 0150 01/21/19 0200   AST ALT ALKPHOS 59 61 65  BILITOT 0.7 0.8 0.4  PROT 5.7* 6.1* 6.2*  ALBUMIN 2.7* 2.9* 3.0*   No results for input(s): LIPASE, AMYLASE in the last 168 hours. No results for input(s): AMMONIA in the last 168 hours. Coagulation Profile: No results for input(s): INR, PROTIME in the last 168 hours. Cardiac Enzymes: No results for input(s): CKTOTAL, CKMB, CKMBINDEX, TROPONINI in the last 168 hours. BNP (last 3 results) No results for input(s): PROBNP in the last 8760 hours. HbA1C: No results for input(s): HGBA1C in the last 72 hours. CBG: No results for input(s): GLUCAP in the last 168 hours. Lipid Profile: No results for input(s): CHOL, HDL, LDLCALC, TRIG, CHOLHDL, LDLDIRECT in the last 72 hours. Thyroid Function Tests: No results for input(s): TSH, T4TOTAL, FREET4, T3FREE, THYROIDAB in the last 72 hours. Anemia Panel: Recent Labs    01/20/19 1000 01/21/19 0200  FERRITIN 126 132   Urine analysis:    Component Value Date/Time   COLORURINE YELLOW 06/15/2018 0350   APPEARANCEUR CLEAR 06/15/2018 0350   LABSPEC 1.014 06/15/2018 0350   PHURINE 5.0 06/15/2018 0350   GLUCOSEU NEGATIVE 06/15/2018 0350   HGBUR NEGATIVE 06/15/2018 0350   BILIRUBINUR NEGATIVE 06/15/2018 0350   KETONESUR NEGATIVE 06/15/2018 0350   PROTEINUR NEGATIVE 06/15/2018 0350   UROBILINOGEN 1.0 07/09/2012 0010   NITRITE NEGATIVE 06/15/2018 0350   LEUKOCYTESUR NEGATIVE 06/15/2018 0350   Sepsis Labs: (procalcitonin:4,lacticidven:4)  ) Recent Results (from the past 240 hour(s))  SARS CORONAVIRUS 2 (TAT 6-24 HRS) Nasopharyngeal Nasopharyngeal Swab     Status: Abnormal   Collection Time: 01/18/19 12:03 PM   Specimen: Nasopharyngeal Swab  Result Value Ref Range Status   SARS Coronavirus 2 POSITIVE (A) NEGATIVE Final    Comment: RESULT CALLED TO, READ BACK BY AND VERIFIED WITH: M.COFFEY RN [id number] 01/18/2019 MCCORMICK K (NOTE) SARS-CoV-2 target nucleic acids are DETECTED. The  SARS-CoV-2 RNA is generally detectable in upper and lower respiratory specimens during the acute phase of infection. Positive results are indicative of the presence of SARS-CoV-2 RNA. Clinical correlation with patient history and other diagnostic information is  necessary to determine patient infection status. Positive results do not rule out bacterial infection or co-infection with other viruses.  The expected result is Negative. Fact Sheet for Patients: HairSlick.no Fact Sheet for Healthcare Providers: quierodirigir.com  This test is not yet approved or cleared by the [place] and  has been authorized for detection and/or diagnosis of SARS-CoV-2 by FDA under an Emergency Use Authorization (EUA). This EUA will remain  in effect (meaning this test can be used) for  the duration of the COVID-19 declaration under Section 564(b)(1) of the Act, 21 U.S.C. section 360bbb-3(b)(1), unless the authorization is terminated or revoked sooner. Performed at [facility], [address], [place], [place] [address]   Blood Culture (routine x 2)     Status: None (Preliminary result)   Collection Time: 01/18/19 12:03 PM   Specimen: BLOOD  Result Value Ref Range Status   Specimen Description BLOOD RIGHT ANTECUBITAL  Final   Special Requests   Final    BOTTLES DRAWN AEROBIC AND ANAEROBIC Blood Culture adequate volume   Culture   Final    NO GROWTH 3 DAYS Performed at [facility], [address], [place], [place] [address]    Report Status PENDING  Incomplete  Blood Culture (routine x 2)     Status: None (Preliminary result)   Collection Time: 01/18/19 12:08 PM   Specimen: BLOOD  Result Value Ref Range Status   Specimen Description BLOOD BLOOD RIGHT HAND  Final   Special Requests   Final    BOTTLES DRAWN AEROBIC AND ANAEROBIC Blood Culture adequate volume   Culture   Final    NO GROWTH 3 DAYS Performed at [facility], [address], [place], [place] [address]    Report Status PENDING  Incomplete         Radiology Studies: CT HEAD WO CONTRAST  Result Date: 01/19/2019 CLINICAL DATA:  68 year old male with focal neurologic deficit. EXAM: CT HEAD WITHOUT CONTRAST TECHNIQUE: Contiguous axial images were obtained from the base of the skull through the vertex without intravenous contrast. COMPARISON:  Head CT dated 07/10/2012. FINDINGS: Brain: The ventricles and sulci are appropriate in size for the patient's age. Minimal periventricular and deep white matter chronic microvascular ischemic changes noted. There is no acute intracranial hemorrhage. No mass effect or midline shift. No extra-axial fluid collection. Vascular: No hyperdense vessel or unexpected calcification. Skull: Normal. Negative for fracture or focal lesion. Sinuses/Orbits: Complete opacification of the left maxillary sinus as well as partial opacification of the sphenoid sinuses. No air-fluid level. The mastoid air cells are clear. Other: None IMPRESSION: No acute intracranial pathology. Electronically Signed   By: Elgie Collard M.D.   On: 01/19/2019 16:54   CT ANGIO CHEST PE W OR WO CONTRAST  Result Date: 01/19/2019 CLINICAL DATA:  Shortness of breath.  COVID positive. EXAM: CT ANGIOGRAPHY CHEST WITH CONTRAST TECHNIQUE: Multidetector CT imaging of the chest was performed using the standard protocol during bolus administration of intravenous contrast. Multiplanar CT image reconstructions and MIPs were obtained to evaluate the vascular anatomy. CONTRAST:  6mL OMNIPAQUE IOHEXOL 350 MG/ML SOLN COMPARISON:  Chest x-ray from yesterday. CT chest dated September 09, 2017. FINDINGS: Cardiovascular: Satisfactory opacification of the pulmonary arteries to the segmental level. No evidence of pulmonary embolism. Normal heart size. No pericardial effusion. No thoracic aortic aneurysm or dissection. Mediastinum/Nodes: Mildly enlarged right paratracheal  lymph node measuring 1.1 cm in short axis, likely reactive. No enlarged hilar or axillary lymph nodes. The thyroid gland, trachea, and esophagus demonstrate no significant findings. Lungs/Pleura: Scattered peripheral predominant irregular ground-glass densities and nodular consolidation with pleuroparenchymal scarring. 1.9 x 2.5 cm cavitary nodule in the right upper lobe. 9  x 9 mm cavitary nodule in the left upper lobe. No pleural effusion or pneumothorax. Upper Abdomen: No acute abnormality. Musculoskeletal: No chest wall abnormality. No acute or significant osseous findings. Prior T10-T12 posterior decompression. Review of the MIP images confirms the above findings. IMPRESSION: 1. Scattered peripheral predominant irregular ground-glass densities and nodular consolidation with pleuroparenchymal scarring, favored to reflect sequelae of COVID-19 pneumonia. Two cavitary nodules in both upper lobes measuring up to 2.5 cm, which can occur with COVID-19 infection. The differential also includes bacterial or fungal pneumonia. Septic pulmonary emboli are thought to be less likely. 2. No evidence of pulmonary embolism. Electronically Signed   By: Obie DredgeWilliam T Derry M.D.   On: 01/19/2019 17:26        Scheduled Meds: . allopurinol  100 mg Oral BID  . amLODipine  2.5 mg Oral q morning - 10a  . amoxicillin-clavulanate  1 tablet Oral Q12H  . enoxaparin (LOVENOX) injection  40 mg Subcutaneous Q24H  . fluticasone  2 spray Each Nare Daily  . lisinopril  10 mg Oral Daily  . oxymetazoline  1 spray Each Nare BID  . pantoprazole  40 mg Oral BID  . predniSONE  50 mg Oral BID WC  . sertraline  200 mg Oral q morning - 10a  . sodium chloride flush  3 mL Intravenous Q12H   Continuous Infusions: . sodium chloride    . lactated ringers 100 mL/hr at 01/21/19 0645     LOS: 3 days   The patient is critically ill with multiple organ systems failure and requires high complexity decision making for assessment and support,  frequent evaluation and titration of therapies, application of advanced monitoring technologies and extensive interpretation of multiple databases. Critical Care Time devoted to patient care services described in this note  Time spent: 40 minutes     Taraann Olthoff, Roselind MessierURTIS J, MD [facility] Pager [phone]  If 7PM-7AM, please contact night-coverage [email or web address] Password [email or web address] 01/21/2019, 1:13 PM

## 2019-01-22 DIAGNOSIS — J1282 Pneumonia due to coronavirus disease 2019: Secondary | ICD-10-CM

## 2019-01-22 LAB — COMPREHENSIVE METABOLIC PANEL
ALT: 20 U/L (ref 0–44)
AST: 14 U/L — ABNORMAL LOW (ref 15–41)
Albumin: 2.9 g/dL — ABNORMAL LOW (ref 3.5–5.0)
Alkaline Phosphatase: 57 U/L (ref 38–126)
Anion gap: 6 (ref 5–15)
BUN: 21 mg/dL (ref 8–23)
CO2: 25 mmol/L (ref 22–32)
Calcium: 8.5 mg/dL — ABNORMAL LOW (ref 8.9–10.3)
Chloride: 104 mmol/L (ref 98–111)
Creatinine, Ser: 1.07 mg/dL (ref 0.61–1.24)
GFR calc Af Amer: >60 mL/min (ref >60)
GFR calc non Af Amer: >60 mL/min (ref >60)
Glucose, Bld: 146 mg/dL — ABNORMAL HIGH (ref 70–99)
Potassium: 4.4 mmol/L (ref 3.5–5.1)
Sodium: 135 mmol/L (ref 135–145)
Total Bilirubin: 0.7 mg/dL (ref 0.3–1.2)
Total Protein: 6 g/dL — ABNORMAL LOW (ref 6.5–8.1)

## 2019-01-22 LAB — CBC WITH DIFFERENTIAL/PLATELET
Abs Immature Granulocytes: 0.07 10*3/uL (ref 0.00–0.07)
Basophils Absolute: 0 10*3/uL (ref 0.0–0.1)
Basophils Relative: 0 %
Eosinophils Absolute: 0 10*3/uL (ref 0.0–0.5)
Eosinophils Relative: 0 %
HCT: 33.3 % — ABNORMAL LOW (ref 39.0–52.0)
Hemoglobin: 10.5 g/dL — ABNORMAL LOW (ref 13.0–17.0)
Immature Granulocytes: 1 %
Lymphocytes Relative: 6 %
Lymphs Abs: 0.4 10*3/uL — ABNORMAL LOW (ref 0.7–4.0)
MCH: 27.1 pg (ref 26.0–34.0)
MCHC: 31.5 g/dL (ref 30.0–36.0)
MCV: 86 fL (ref 80.0–100.0)
Monocytes Absolute: 0.2 10*3/uL (ref 0.1–1.0)
Monocytes Relative: 3 %
Neutro Abs: 6.9 10*3/uL (ref 1.7–7.7)
Neutrophils Relative %: 90 %
Platelets: 173 10*3/uL (ref 150–400)
RBC: 3.87 MIL/uL — ABNORMAL LOW (ref 4.22–5.81)
RDW: 14.6 % (ref 11.5–15.5)
WBC: 7.6 10*3/uL (ref 4.0–10.5)
nRBC: 0 % (ref 0.0–0.2)

## 2019-01-22 LAB — FERRITIN: Ferritin: 126 ng/mL (ref 24–336)

## 2019-01-22 LAB — C-REACTIVE PROTEIN: CRP: 7.9 mg/dL — ABNORMAL HIGH (ref <1.0)

## 2019-01-22 LAB — MAGNESIUM: Magnesium: 2.2 mg/dL (ref 1.7–2.4)

## 2019-01-22 LAB — D-DIMER, QUANTITATIVE: D-Dimer, Quant: 0.36 ug/mL-FEU (ref 0.00–0.50)

## 2019-01-22 LAB — PHOSPHORUS: Phosphorus: 2.4 mg/dL — ABNORMAL LOW (ref 2.5–4.6)

## 2019-01-22 MED ORDER — AMLODIPINE BESYLATE 5 MG PO TABS
2.5000 mg | ORAL_TABLET | Freq: Once | ORAL | Status: AC
  Administered 2019-01-22: 2.5 mg via ORAL
  Filled 2019-01-22: qty 1

## 2019-01-22 MED ORDER — LISINOPRIL 20 MG PO TABS: 20.0000 mg | ORAL_TABLET | Freq: Every day | ORAL | Status: DC

## 2019-01-22 MED ORDER — AMLODIPINE BESYLATE 10 MG PO TABS: 10.0000 mg | ORAL_TABLET | Freq: Every morning | ORAL | Status: DC

## 2019-01-22 MED ORDER — LISINOPRIL 20 MG PO TABS
20.0000 mg | ORAL_TABLET | Freq: Every day | ORAL | Status: DC
  Administered 2019-01-22 – 2019-01-23 (×2): 20 mg via ORAL
  Filled 2019-01-22: qty 1

## 2019-01-22 MED ORDER — AMLODIPINE BESYLATE 10 MG PO TABS
10.0000 mg | ORAL_TABLET | Freq: Every morning | ORAL | Status: DC
  Administered 2019-01-22 – 2019-01-23 (×2): 10 mg via ORAL
  Filled 2019-01-22 (×2): qty 1

## 2019-01-22 NOTE — Progress Notes (Signed)
SATURATION QUALIFICATIONS: (This note is used to comply with regulatory documentation for home oxygen)  Patient Saturations on Room Air at Rest = 84%  Patient Saturations on Room Air while Ambulating = 79%  Patient Saturations on 6 Liters of oxygen while Ambulating = 92%  Please briefly explain why patient needs home oxygen: Patient desats to the high 70s on room air while ambulating. Saturation improved to 86% on 2L [place], 89% on 4L [place], and reached 92% while ambulating when increased to 6L [place]. Patient states he was wearing 4L [place] at home when ambulating and was still "somewhat struggling." Patient's O2 returned to 96% on 2L [place] at rest.

## 2019-01-22 NOTE — Plan of Care (Signed)
RN updated pt's son, Molly Maduro, on plan of care. All concerns addressed. Pt currently stable on 2L [place]. Rn to perform ambulation oxygen test after lunch.  Problem: Education: Goal: Knowledge of General Education information will improve Description: Including pain rating scale, medication(s)/side effects and non-pharmacologic comfort measures Outcome: Progressing   Problem: Health Behavior/Discharge Planning: Goal: Ability to manage health-related needs will improve Outcome: Progressing   Problem: Clinical Measurements: Goal: Ability to maintain clinical measurements within normal limits will improve Outcome: Progressing Goal: Will remain free from infection Outcome: Progressing Goal: Diagnostic test results will improve Outcome: Progressing Goal: Respiratory complications will improve Outcome: Progressing Goal: Cardiovascular complication will be avoided Outcome: Progressing   Problem: Activity: Goal: Risk for activity intolerance will decrease Outcome: Progressing   Problem: Nutrition: Goal: Adequate nutrition will be maintained Outcome: Progressing   Problem: Coping: Goal: Level of anxiety will decrease Outcome: Progressing   Problem: Elimination: Goal: Will not experience complications related to urinary retention Outcome: Progressing   Problem: Pain Managment: Goal: General experience of comfort will improve Outcome: Progressing   Problem: Safety: Goal: Ability to remain free from injury will improve Outcome: Progressing   Problem: Skin Integrity: Goal: Risk for impaired skin integrity will decrease Outcome: Progressing   Problem: Elimination: Goal: Will not experience complications related to bowel motility Outcome: Not Progressing

## 2019-01-22 NOTE — Progress Notes (Signed)
PROGRESS NOTE    Kurt [name]  VZD:[id number] DOB: April 12, 1950 DOA: 01/18/2019 PCP: Kaleen Mask, MD   Brief Narrative:  69 y.o.WM PMHx HTN; CHF?; CKD stage 3 ;   and recent hospitalization for COVID-19 associated PNA presenting with SOB. He was previously hospitalized from 12/6-16 for acute hypoxic respiratory failure due to COVID-19 PNA and completed steroids and remdesivir. He was discharged on a Rollator with 2L [place] O2 to be used with exertion. He was discharged to home and hasn't had any wind. He would have to stop to rest going from bedroom to bath to kitchen. He felt like he needed O2 constantly. It felt harder to breathe. If he wasn't wearing O2 his sats would fall to the 70s. His left hand feels numb. He has pleuritic pain in his chest and side with deep breaths. He went to his PCP today and they sent him back to the ER. He hasn't had much of an appetite, isn't eating much but has been drinking water, tea, coffee.   ED Course:Patient diagnosed about Thanksgiving, d/c on 12/16, doing better. Worsening SOB, 70s on RA. Looks more comfortable on 2L [place] O2. Concern for PE - most other symptoms better but SOB feels worse than prior. Sometimes increases to 4L [place] O2. Mild renal failure. BP 80s, improved with IVF.  Started on empiric heparin for suspected PE. Noted to be on 6 L 12/29. Transferred to [facility] initially on 12/29, however patient was suspected to be non infectious due to COVID diagnosis >21 days ago and here with suspected PE. Plan for transfer back to [facility] for further management.     Subjective: 1/1 A/O x4, negative CP negative abdominal pain.  States had a rough night did not sleep well and then woke up with a headache which has now resolved with medication    Assessment & Plan:   Principal Problem:   Acute on chronic respiratory failure with hypoxia (HCC) Active Problems:   Hypertension   Acute kidney injury superimposed on CKD (HCC)    Pneumonia due to COVID-19 virus   Acute respiratory failure with hypoxia (HCC)   CKD (chronic kidney disease) stage 3, GFR 30-59 ml/min   Failure to thrive in adult   Major depression, chronic   Obesity, Class III, BMI 40-49.9 (morbid obesity) (HCC)   OSA (obstructive sleep apnea)   Acute renal failure superimposed on stage 3a chronic kidney disease (HCC)   Chronic sinusitis  Covid pneumonia/acute respiratory failure with hypoxia COVID-19 Labs  Recent Labs    01/20/19 1000 01/21/19 0200 01/22/19 0135  DDIMER 0.47 0.44 0.36  FERRITIN 126 132 126  CRP 6.8* 13.2* 7.9*    Lab Results  Component Value Date   SARSCOV2NAA POSITIVE (A) 01/18/2019   SARSCOV2NAA NEGATIVE 06/15/2018  -Patient with cavitary lesions bilateral upper lobe see results CT scan below -Prednisone 50 mg BID.  Will give patient short high-dose burst of steroids.  May require long taper when discharged considering damage in his lungs. -12/31 decrease Prednisone 50 mg daily.  At discharge will place on slow taper SATURATION QUALIFICATIONS: (This note is used to comply with regulatory documentation for home oxygen) Patient Saturations on Room Air at Rest = 84% Patient Saturations on Room Air while Ambulating = 79% Patient Saturations on 6 Liters of oxygen while Ambulating = 92% Please briefly explain why patient needs home oxygen: Patient desats to the high 70s on room air while ambulating. Saturation improved to 86% on 2L , 89% on 4L  [place], and reached 92% while ambulating when increased to 6L [place]. Patient states he was wearing 4L [place] at home when ambulating and was still "somewhat struggling." Patient's O2 returned to 96% on 2L [place] at rest -3 L O2 via [address] titrate to maintain SPO2> 88% -Provide [employer] portable O2 concentrator  OSA/OHS undiagnosed -Patient with witnessed apneic events will require sleep study upon discharge.  Chronic sinusitis -Afrin x3 days -Flonase BID -Augmentin x21 days  Essential HTN -1/1  increase lisinopril 20 mg daily (home dose 40 mg daily) -1/1 increase Amlodipine 10 mg daily -1/1  Acute on CKD stage IIIa (baseline Cr` 1.23) Recent Labs  Lab 01/18/19 1215 01/19/19 0500 01/20/19 0150 01/21/19 0200 01/22/19 0135  CREATININE 2.06* 1.36* 1.04 1.03 1.07  -Back to baseline  Hypophosphatemia -Sodium phosphate 30 mmol  Obese -12/31 nutrition consult; good nutrition habits/weight loss  Goals of care -Face-to-face complete    DVT prophylaxis: Lovenox Code Status: Full Family Communication:  Disposition Plan:    Consultants:    Procedures/Significant Events:  10/29 CT head Wo contrast;-negative acute intracranial pathology -Sinuses/Orbits: Complete opacification of the left maxillary sinus as well as partial opacification of the sphenoid sinuses. No air-fluid level. The mastoid air cells are clear. 10/29 CTA chest PE protocol;-negative PE  -Scattered peripheral predominant irregular ground-glass densities and nodular consolidation with pleuroparenchymal scarring, favored to reflect sequelae of COVID-19 pneumonia.  -Two cavitary nodules in both upper lobes measuring up to 2.5 cm, which can occur with COVID-19 infection. The differential also includes bacterial or fungal pneumonia. .   I have personally reviewed and interpreted all radiology studies and my findings are as above.  VENTILATOR SETTINGS: Nasal cannula 1/1 Flow; 2 L/min SPO2; 93%   Cultures   Antimicrobials: Anti-infectives (From admission, onward)   Start     Dose/Rate Route Frequency Ordered Stop   01/20/19 1630  amoxicillin-clavulanate (AUGMENTIN) 875-125 MG per tablet 1 tablet     1 tablet Oral Every 12 hours 01/20/19 1617         Devices   LINES / TUBES:      Continuous Infusions: . sodium chloride    . lactated ringers 100 mL/hr at 01/21/19 2154     Objective: Vitals:   01/21/19 2149 01/22/19 0000 01/22/19 0200 01/22/19 0714  BP: (!) 153/84 (!) 162/85 (!) 147/77  (!) 153/71  Pulse: 64 68 74 68  Resp:  (!) 22 19 20   Temp:    98.7 F (37.1 C)  TempSrc:    Oral  SpO2: 96% 94% 93% 98%  Weight:      Height:        Intake/Output Summary (Last 24 hours) at 01/22/2019 0741 Last data filed at 01/22/2019 0719 Gross per 24 hour  Intake 483 ml  Output 725 ml  Net -242 ml   Filed Weights   01/18/19 1600 01/20/19 0500 01/21/19 0500  Weight: 126.6 kg 126 kg 123.7 kg    Physical Exam:  General: A/O x4, positive acute respiratory distress Eyes: negative scleral hemorrhage, negative anisocoria, negative icterus ENT: Negative Runny nose, negative gingival bleeding, Neck:  Negative scars, masses, torticollis, lymphadenopathy, JVD Lungs: Decreased breath sounds bilaterally without wheezes or crackles Cardiovascular: Regular rate and rhythm without murmur gallop or rub normal S1 and S2 Abdomen: negative abdominal pain, nondistended, positive soft, bowel sounds, no rebound, no ascites, no appreciable mass Extremities: No significant cyanosis, clubbing, or edema bilateral lower extremities Skin: Negative rashes, lesions, ulcers Psychiatric:  Negative depression, negative anxiety, negative fatigue,  negative mania  Central nervous system:  Cranial nerves II through XII intact, tongue/uvula midline, all extremities muscle strength 5/5, sensation intact throughout, negative dysarthria, negative expressive aphasia, negative receptive aphasia.    .     Data Reviewed: Care during the described time interval was provided by me .  I have reviewed this patient's available data, including medical history, events of note, physical examination, and all test results as part of my evaluation.   CBC: Recent Labs  Lab 01/18/19 1215 01/19/19 0500 01/20/19 0150 01/21/19 0200 01/22/19 0135  WBC 10.6* 7.3 7.2 7.9 7.6  NEUTROABS 8.8* 5.0 5.3 7.5 6.9  HGB 13.1 11.4* 11.0* 11.4* 10.5*  HCT 41.5 36.9* 35.0* 37.3* 33.3*  MCV 87.0 88.9 88.8 89.9 86.0  PLT 221 171 150 155  329   Basic Metabolic Panel: Recent Labs  Lab 01/18/19 1215 01/19/19 0500 01/19/19 1030 01/20/19 0150 01/20/19 1000 01/21/19 0200 01/22/19 0135  NA 139 139  --  139  --  137 135  K 3.6 3.7  --  3.9  --  4.4 4.4  CL 99 103  --  103  --  102 104  CO2 26 26  --  26  --  25 25  GLUCOSE 121* 98  --  90  --  147* 146*  BUN 25* 20  --  21  --  15 21  CREATININE 2.06* 1.36*  --  1.04  --  1.03 1.07  CALCIUM 8.9 8.3*  --  8.5*  --  8.6* 8.5*  MG  --   --  1.9  --  2.0 2.0 2.2  PHOS  --   --   --   --  1.7* 3.3 2.4*   GFR: Estimated Creatinine Clearance: 84.8 mL/min (by C-G formula based on SCr of 1.07 mg/dL). Liver Function Tests: Recent Labs  Lab 01/19/19 0500 01/20/19 0150 01/21/19 0200 01/22/19 0135  AST 15 15 15  14*  ALT 20 19 19 20   ALKPHOS 59 61 65 57  BILITOT 0.7 0.8 0.4 0.7  PROT 5.7* 6.1* 6.2* 6.0*  ALBUMIN 2.7* 2.9* 3.0* 2.9*   No results for input(s): LIPASE, AMYLASE in the last 168 hours. No results for input(s): AMMONIA in the last 168 hours. Coagulation Profile: No results for input(s): INR, PROTIME in the last 168 hours. Cardiac Enzymes: No results for input(s): CKTOTAL, CKMB, CKMBINDEX, TROPONINI in the last 168 hours. BNP (last 3 results) No results for input(s): PROBNP in the last 8760 hours. HbA1C: No results for input(s): HGBA1C in the last 72 hours. CBG: No results for input(s): GLUCAP in the last 168 hours. Lipid Profile: No results for input(s): CHOL, HDL, LDLCALC, TRIG, CHOLHDL, LDLDIRECT in the last 72 hours. Thyroid Function Tests: No results for input(s): TSH, T4TOTAL, FREET4, T3FREE, THYROIDAB in the last 72 hours. Anemia Panel: Recent Labs    01/21/19 0200 01/22/19 0135  FERRITIN 132 126   Urine analysis:    Component Value Date/Time   COLORURINE YELLOW 06/15/2018 0350   APPEARANCEUR CLEAR 06/15/2018 0350   LABSPEC 1.014 06/15/2018 0350   PHURINE 5.0 06/15/2018 0350   GLUCOSEU NEGATIVE 06/15/2018 0350   HGBUR NEGATIVE  06/15/2018 0350   BILIRUBINUR NEGATIVE 06/15/2018 0350   KETONESUR NEGATIVE 06/15/2018 0350   PROTEINUR NEGATIVE 06/15/2018 0350   UROBILINOGEN 1.0 07/09/2012 0010   NITRITE NEGATIVE 06/15/2018 0350   LEUKOCYTESUR NEGATIVE 06/15/2018 0350   Sepsis Labs: @LABRCNTIP (procalcitonin:4,lacticidven:4)  ) Recent Results (from the past 240 hour(s))  SARS CORONAVIRUS  2 (TAT 6-24 HRS) Nasopharyngeal Nasopharyngeal Swab     Status: Abnormal   Collection Time: 01/18/19 12:03 PM   Specimen: Nasopharyngeal Swab  Result Value Ref Range Status   SARS Coronavirus 2 POSITIVE (A) NEGATIVE Final    Comment: RESULT CALLED TO, READ BACK BY AND VERIFIED WITH: M.COFFEY RN 1754 01/18/2019 MCCORMICK K (NOTE) SARS-CoV-2 target nucleic acids are DETECTED. The SARS-CoV-2 RNA is generally detectable in upper and lower respiratory specimens during the acute phase of infection. Positive results are indicative of the presence of SARS-CoV-2 RNA. Clinical correlation with patient history and other diagnostic information is  necessary to determine patient infection status. Positive results do not rule out bacterial infection or co-infection with other viruses.  The expected result is Negative. Fact Sheet for Patients: HairSlick.nohttps://www.fda.gov/media/138098/download Fact Sheet for Healthcare Providers: quierodirigir.comhttps://www.fda.gov/media/138095/download This test is not yet approved or cleared by the [place] FDA and  has been authorized for detection and/or diagnosis of SARS-CoV-2 by FDA under an Emergency Use Authorization (EUA). This EUA will remain  in effect (meaning this test can be used) for  the duration of the COVID-19 declaration under Section 564(b)(1) of the Act, 21 U.S.C. section 360bbb-3(b)(1), unless the authorization is terminated or revoked sooner. Performed at [facility], [address] [place], [place] [address]   Blood Culture (routine x 2)     Status: None (Preliminary result)   Collection  Time: 01/18/19 12:03 PM   Specimen: BLOOD  Result Value Ref Range Status   Specimen Description BLOOD RIGHT ANTECUBITAL  Final   Special Requests   Final    BOTTLES DRAWN AEROBIC AND ANAEROBIC Blood Culture adequate volume   Culture   Final    NO GROWTH 3 DAYS Performed at [facility], [address] [place], [place] [address]    Report Status PENDING  Incomplete  Blood Culture (routine x 2)     Status: None (Preliminary result)   Collection Time: 01/18/19 12:08 PM   Specimen: BLOOD  Result Value Ref Range Status   Specimen Description BLOOD BLOOD RIGHT HAND  Final   Special Requests   Final    BOTTLES DRAWN AEROBIC AND ANAEROBIC Blood Culture adequate volume   Culture   Final    NO GROWTH 3 DAYS Performed at [facility], [address] [place], [place] [address]    Report Status PENDING  Incomplete         Radiology Studies: No results found.      Scheduled Meds: . allopurinol  100 mg Oral BID  . amLODipine  2.5 mg Oral q morning - 10a  . amoxicillin-clavulanate  1 tablet Oral Q12H  . enoxaparin (LOVENOX) injection  40 mg Subcutaneous Q24H  . fluticasone  2 spray Each Nare Daily  . lisinopril  15 mg Oral Daily  . oxymetazoline  1 spray Each Nare BID  . pantoprazole  40 mg Oral BID  . predniSONE  50 mg Oral Q breakfast  . sertraline  200 mg Oral q morning - 10a  . sodium chloride flush  3 mL Intravenous Q12H   Continuous Infusions: . sodium chloride    . lactated ringers 100 mL/hr at 01/21/19 2154     LOS: 4 days   The patient is critically ill with multiple organ systems failure and requires high complexity decision making for assessment and support, frequent evaluation and titration of therapies, application of advanced monitoring technologies and extensive interpretation of multiple databases. Critical Care  Time devoted to patient care services described in this note  Time spent: 40 minutes     Genell Thede, Roselind Messier, MD [facility] Pager [phone]  If 7PM-7AM, please contact night-coverage [email or web address] Password [facility] 01/22/2019, 7:41 AM

## 2019-01-22 NOTE — Plan of Care (Signed)
Patient alert and oriented, he is sitting up in chair, assessment completed, care plan updated. No complaints of pain at this time. Will continue to monitor throughout shift.   Problem: Education: Goal: Knowledge of General Education information will improve Description: Including pain rating scale, medication(s)/side effects and non-pharmacologic comfort measures Outcome: Progressing   Problem: Health Behavior/Discharge Planning: Goal: Ability to manage health-related needs will improve Outcome: Progressing   Problem: Clinical Measurements: Goal: Ability to maintain clinical measurements within normal limits will improve Outcome: Progressing Goal: Will remain free from infection Outcome: Progressing Goal: Diagnostic test results will improve Outcome: Progressing Goal: Respiratory complications will improve Outcome: Progressing Goal: Cardiovascular complication will be avoided Outcome: Progressing   Problem: Activity: Goal: Risk for activity intolerance will decrease Outcome: Progressing   Problem: Nutrition: Goal: Adequate nutrition will be maintained Outcome: Progressing   Problem: Coping: Goal: Level of anxiety will decrease Outcome: Progressing   Problem: Elimination: Goal: Will not experience complications related to bowel motility Outcome: Progressing Goal: Will not experience complications related to urinary retention Outcome: Progressing   Problem: Pain Managment: Goal: General experience of comfort will improve Outcome: Progressing   Problem: Safety: Goal: Ability to remain free from injury will improve Outcome: Progressing   Problem: Skin Integrity: Goal: Risk for impaired skin integrity will decrease Outcome: Progressing

## 2019-01-23 DIAGNOSIS — N178 Other acute kidney failure: Secondary | ICD-10-CM

## 2019-01-23 LAB — CBC WITH DIFFERENTIAL/PLATELET
Abs Immature Granulocytes: 0.1 10*3/uL — ABNORMAL HIGH (ref 0.00–0.07)
Basophils Absolute: 0 10*3/uL (ref 0.0–0.1)
Basophils Relative: 0 %
Eosinophils Absolute: 0 10*3/uL (ref 0.0–0.5)
Eosinophils Relative: 0 %
HCT: 33.5 % — ABNORMAL LOW (ref 39.0–52.0)
Hemoglobin: 10.6 g/dL — ABNORMAL LOW (ref 13.0–17.0)
Immature Granulocytes: 1 %
Lymphocytes Relative: 10 %
Lymphs Abs: 0.9 10*3/uL (ref 0.7–4.0)
MCH: 27.7 pg (ref 26.0–34.0)
MCHC: 31.6 g/dL (ref 30.0–36.0)
MCV: 87.7 fL (ref 80.0–100.0)
Monocytes Absolute: 0.6 10*3/uL (ref 0.1–1.0)
Monocytes Relative: 7 %
Neutro Abs: 7.3 10*3/uL (ref 1.7–7.7)
Neutrophils Relative %: 82 %
Platelets: 185 10*3/uL (ref 150–400)
RBC: 3.82 MIL/uL — ABNORMAL LOW (ref 4.22–5.81)
RDW: 14.8 % (ref 11.5–15.5)
WBC: 9.1 10*3/uL (ref 4.0–10.5)
nRBC: 0 % (ref 0.0–0.2)

## 2019-01-23 LAB — PHOSPHORUS: Phosphorus: 2.8 mg/dL (ref 2.5–4.6)

## 2019-01-23 LAB — COMPREHENSIVE METABOLIC PANEL
ALT: 21 U/L (ref 0–44)
AST: 16 U/L (ref 15–41)
Albumin: 2.8 g/dL — ABNORMAL LOW (ref 3.5–5.0)
Alkaline Phosphatase: 61 U/L (ref 38–126)
Anion gap: 7 (ref 5–15)
BUN: 24 mg/dL — ABNORMAL HIGH (ref 8–23)
CO2: 26 mmol/L (ref 22–32)
Calcium: 8.6 mg/dL — ABNORMAL LOW (ref 8.9–10.3)
Chloride: 104 mmol/L (ref 98–111)
Creatinine, Ser: 1.1 mg/dL (ref 0.61–1.24)
GFR calc Af Amer: >60 mL/min (ref >60)
GFR calc non Af Amer: >60 mL/min (ref >60)
Glucose, Bld: 118 mg/dL — ABNORMAL HIGH (ref 70–99)
Potassium: 4.3 mmol/L (ref 3.5–5.1)
Sodium: 137 mmol/L (ref 135–145)
Total Bilirubin: 0.4 mg/dL (ref 0.3–1.2)
Total Protein: 5.9 g/dL — ABNORMAL LOW (ref 6.5–8.1)

## 2019-01-23 LAB — CULTURE, BLOOD (ROUTINE X 2)
Culture: NO GROWTH
Culture: NO GROWTH
Special Requests: ADEQUATE
Special Requests: ADEQUATE

## 2019-01-23 LAB — C-REACTIVE PROTEIN: CRP: 2.7 mg/dL — ABNORMAL HIGH (ref <1.0)

## 2019-01-23 LAB — MAGNESIUM: Magnesium: 2.2 mg/dL (ref 1.7–2.4)

## 2019-01-23 LAB — D-DIMER, QUANTITATIVE: D-Dimer, Quant: 0.29 ug/mL-FEU (ref 0.00–0.50)

## 2019-01-23 LAB — FERRITIN: Ferritin: 103 ng/mL (ref 24–336)

## 2019-01-23 MED ORDER — LISINOPRIL 20 MG PO TABS: 20.0000 mg | ORAL_TABLET | Freq: Every day | ORAL | 0 refills | Status: DC

## 2019-01-23 MED ORDER — FLUTICASONE PROPIONATE 50 MCG/ACT NA SUSP: 2.0000 | Freq: Every day | NASAL | 0 refills | Status: DC

## 2019-01-23 MED ORDER — AMOXICILLIN-POT CLAVULANATE 875-125 MG PO TABS: 1.0000 | ORAL_TABLET | Freq: Two times a day (BID) | ORAL | 0 refills | Status: DC

## 2019-01-23 MED ORDER — PREDNISONE 10 MG PO TABS: 10.0000 mg | ORAL_TABLET | Freq: Every day | ORAL | 0 refills | Status: DC

## 2019-01-23 MED ORDER — PREDNISONE 10 MG PO TABS: 25.0000 mg | ORAL_TABLET | Freq: Every day | ORAL | 0 refills | Status: DC

## 2019-01-23 MED ORDER — PREDNISONE 10 MG PO TABS: 5.0000 mg | ORAL_TABLET | Freq: Every day | ORAL | 0 refills | Status: DC

## 2019-01-23 MED ORDER — AMLODIPINE BESYLATE 10 MG PO TABS: 10.0000 mg | ORAL_TABLET | Freq: Every morning | ORAL | 0 refills | Status: DC

## 2019-01-23 MED ORDER — OXYMETAZOLINE HCL 0.05 % NA SOLN: 1.0000 | Freq: Two times a day (BID) | NASAL | 0 refills | Status: DC

## 2019-01-23 MED ORDER — HYDROCOD POLST-CPM POLST ER 10-8 MG/5ML PO SUER: 5.0000 mL | Freq: Two times a day (BID) | ORAL | 0 refills | Status: DC | PRN

## 2019-01-23 MED ORDER — POLYETHYLENE GLYCOL 3350 17 G PO PACK: 17.0000 g | PACK | Freq: Every day | ORAL | 0 refills | Status: AC | PRN

## 2019-01-23 NOTE — Plan of Care (Signed)
  Problem: Education: Goal: Knowledge of General Education information will improve Description: Including pain rating scale, medication(s)/side effects and non-pharmacologic comfort measures 01/23/2019 0839 by Emilia Beck, RN Outcome: Adequate for Discharge 01/23/2019 0838 by Emilia Beck, RN Outcome: Adequate for Discharge   Problem: Health Behavior/Discharge Planning: Goal: Ability to manage health-related needs will improve 01/23/2019 0839 by Emilia Beck, RN Outcome: Adequate for Discharge 01/23/2019 0838 by Emilia Beck, RN Outcome: Adequate for Discharge   Problem: Clinical Measurements: Goal: Ability to maintain clinical measurements within normal limits will improve 01/23/2019 0839 by Emilia Beck, RN Outcome: Adequate for Discharge 01/23/2019 0838 by Emilia Beck, RN Outcome: Adequate for Discharge Goal: Will remain free from infection 01/23/2019 0839 by Emilia Beck, RN Outcome: Adequate for Discharge 01/23/2019 0838 by Emilia Beck, RN Outcome: Adequate for Discharge Goal: Diagnostic test results will improve 01/23/2019 0839 by Emilia Beck, RN Outcome: Adequate for Discharge 01/23/2019 9509 by Emilia Beck, RN Outcome: Adequate for Discharge Goal: Respiratory complications will improve 01/23/2019 0839 by Emilia Beck, RN Outcome: Adequate for Discharge 01/23/2019 0838 by Emilia Beck, RN Outcome: Adequate for Discharge Goal: Cardiovascular complication will be avoided 01/23/2019 0839 by Emilia Beck, RN Outcome: Adequate for Discharge 01/23/2019 0838 by Emilia Beck, RN Outcome: Adequate for Discharge   Problem: Activity: Goal: Risk for activity intolerance will decrease 01/23/2019 0839 by Emilia Beck, RN Outcome: Adequate for Discharge 01/23/2019 [phone] by Emilia Beck, RN Outcome: Adequate for Discharge   Problem: Nutrition: Goal: Adequate nutrition will be maintained 01/23/2019 0839 by Emilia Beck,  RN Outcome: Adequate for Discharge 01/23/2019 0838 by Emilia Beck, RN Outcome: Adequate for Discharge   Problem: Coping: Goal: Level of anxiety will decrease 01/23/2019 0839 by Emilia Beck, RN Outcome: Adequate for Discharge 01/23/2019 0838 by Emilia Beck, RN Outcome: Adequate for Discharge   Problem: Elimination: Goal: Will not experience complications related to bowel motility 01/23/2019 0839 by Emilia Beck, RN Outcome: Adequate for Discharge 01/23/2019 0838 by Emilia Beck, RN Outcome: Adequate for Discharge Goal: Will not experience complications related to urinary retention 01/23/2019 0839 by Emilia Beck, RN Outcome: Adequate for Discharge 01/23/2019 0838 by Emilia Beck, RN Outcome: Adequate for Discharge   Problem: Pain Managment: Goal: General experience of comfort will improve 01/23/2019 0839 by Emilia Beck, RN Outcome: Adequate for Discharge 01/23/2019 0838 by Emilia Beck, RN Outcome: Adequate for Discharge   Problem: Safety: Goal: Ability to remain free from injury will improve 01/23/2019 0839 by Emilia Beck, RN Outcome: Adequate for Discharge 01/23/2019 0838 by Emilia Beck, RN Outcome: Adequate for Discharge   Problem: Skin Integrity: Goal: Risk for impaired skin integrity will decrease 01/23/2019 0839 by Emilia Beck, RN Outcome: Adequate for Discharge 01/23/2019 0838 by Emilia Beck, RN Outcome: Adequate for Discharge

## 2019-01-23 NOTE — TOC Initial Note (Addendum)
Transition of Care [facility]) - Initial/Assessment Note    Patient Details  Name: Kurt [name] MRN: [id number] Date of Birth: [birth year]  Transition of Care [facility]) CM/SW Contact:    Alberteen Sam, [place] Phone Number: [phone] 01/23/2019, 9:12 AM  Clinical Narrative:                  CSW spoke with patient regarding home health recommendation at time of discharge for PT, aide and social worker. Patient is agreeable with no preference as to home health agency.   CSW reached out to [facility] with [place], he reports they are able to accept patient.   CSW notes per MD patient already has O2 at home. CSW confirmed with [facility] he is active with them and informed them of patient's dc today. [place] [facility] will bring [employer] tank to patient's room for discharge home.   Patient reports he has transportation home. No further dc needs identified at this time.   Expected Discharge Plan: [place] Barriers to Discharge: No Barriers Identified   Patient Goals and CMS Choice Patient states their goals for this hospitalization and ongoing recovery are:: to go home CMS Medicare.gov Compare Post Acute Care list provided to:: Patient Choice offered to / list presented to : Patient  Expected Discharge Plan and Services Expected Discharge Plan: [place]       Living arrangements for the past 2 months: Single Family Home Expected Discharge Date: 01/23/19                         HH Arranged: PT, Nurse's Aide, Social Work [employer] Agency: [place] Date [facility] Agency Contacted: 01/23/19 Time HH Agency Contacted: 0900 Representative spoke with at [place]: [place] Arrangements/Services Living arrangements for the past 2 months: [place] with:: Self Patient language and need for interpreter reviewed:: Yes Do you feel safe going back to the place where you live?: Yes      Need for Family Participation in Patient Care: Yes (Comment) Care giver  support system in place?: Yes (comment)   Criminal Activity/Legal Involvement Pertinent to Current Situation/Hospitalization: No - Comment as needed  Activities of Daily Living Home Assistive Devices/Equipment: Cane (specify quad or straight) ADL Screening (condition at time of admission) Patient's cognitive ability adequate to safely complete daily activities?: Yes Is the patient deaf or have difficulty hearing?: No Does the patient have difficulty seeing, even when wearing glasses/contacts?: No Does the patient have difficulty concentrating, remembering, or making decisions?: No Patient able to express need for assistance with ADLs?: Yes Does the patient have difficulty dressing or bathing?: Yes Independently performs ADLs?: No Communication: Independent, Appropriate for developmental age Does the patient have difficulty walking or climbing stairs?: No Weakness of Legs: None Weakness of Arms/Hands: Left(Numbness at times reported by pt Elbow to hand)  Permission Sought/Granted Permission sought to share information with : Case Manager, Customer service manager, Family Supports Permission granted to share information with : Yes, Verbal Permission Granted  Share Information with NAME: [name]  Permission granted to share info w AGENCY: [place] granted to share info w Relationship: son  Permission granted to share info w Contact Information: [phone]  Emotional Assessment Appearance:: Other (Comment Required(unable to assess - remote) Attitude/Demeanor/Rapport: Unable to Assess Affect (typically observed): Unable to Assess Orientation: : Oriented to Self, Oriented to Place, Oriented to  Time, Oriented to Situation Alcohol /  Substance Use: Not Applicable Psych Involvement: No (comment)  Admission diagnosis:  Acute on chronic respiratory failure with hypoxia (HCC) [J96.21] Acute respiratory disease due to COVID-19 virus [U07.1, J06.9] Patient Active Problem  List   Diagnosis Date Noted  . OSA (obstructive sleep apnea) 01/20/2019  . Acute renal failure superimposed on stage 3a chronic kidney disease (HCC) 01/20/2019  . Chronic sinusitis 01/20/2019  . Acute on chronic respiratory failure with hypoxia (HCC) 01/18/2019  . Failure to thrive in adult 01/18/2019  . Major depression, chronic 01/18/2019  . Obesity, Class III, BMI 40-49.9 (morbid obesity) (HCC) 01/18/2019  . Pneumonia due to COVID-19 virus 12/27/2018  . Acute respiratory failure with hypoxia (HCC) 12/27/2018  . CKD (chronic kidney disease) stage 3, GFR 30-59 ml/min 12/27/2018  . CHF (congestive heart failure) (HCC) 12/27/2018  . QT prolongation 06/15/2018  . 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 GI bleed 04/29/2014  . Acute kidney injury superimposed on CKD (HCC) 04/29/2014  . TIA (transient ischemic attack) 07/08/2012  . Diverticulitis 07/08/2012  . Hypertension 07/08/2012   PCP:  Kaleen Mask, MD Pharmacy:   [facility] [address] [place] - [address] AT [facility] [address] [place] [address] Phone: [phone] Fax: [phone]     Social Determinants of Health (SDOH) Interventions    Readmission Risk Interventions No flowsheet data found.

## 2019-01-23 NOTE — Plan of Care (Signed)
Patient to discharge home today. Discharge education provided to patient. All questions addressed. SW/CM assisting with home health set-up. Will continue to monitor until pt discharged.  Problem: Education: Goal: Knowledge of General Education information will improve Description: Including pain rating scale, medication(s)/side effects and non-pharmacologic comfort measures Outcome: Adequate for Discharge   Problem: Health Behavior/Discharge Planning: Goal: Ability to manage health-related needs will improve Outcome: Adequate for Discharge   Problem: Clinical Measurements: Goal: Ability to maintain clinical measurements within normal limits will improve Outcome: Adequate for Discharge Goal: Will remain free from infection Outcome: Adequate for Discharge Goal: Diagnostic test results will improve Outcome: Adequate for Discharge Goal: Respiratory complications will improve Outcome: Adequate for Discharge Goal: Cardiovascular complication will be avoided Outcome: Adequate for Discharge   Problem: Activity: Goal: Risk for activity intolerance will decrease Outcome: Adequate for Discharge   Problem: Nutrition: Goal: Adequate nutrition will be maintained Outcome: Adequate for Discharge   Problem: Coping: Goal: Level of anxiety will decrease Outcome: Adequate for Discharge   Problem: Elimination: Goal: Will not experience complications related to bowel motility Outcome: Adequate for Discharge Goal: Will not experience complications related to urinary retention Outcome: Adequate for Discharge   Problem: Pain Managment: Goal: General experience of comfort will improve Outcome: Adequate for Discharge   Problem: Safety: Goal: Ability to remain free from injury will improve Outcome: Adequate for Discharge   Problem: Skin Integrity: Goal: Risk for impaired skin integrity will decrease Outcome: Adequate for Discharge

## 2019-01-23 NOTE — Discharge Summary (Signed)
Physician Discharge Summary  Kurt [name] WUJ:[id number] DOB: Mar 03, 1950 DOA: 01/18/2019  PCP: Kaleen MaskElkins, Wilson Oliver, MD  Admit date: 01/18/2019 Discharge date: 01/23/2019  Time spent: 30 minutes  Recommendations for Outpatient Follow-up: Covid pneumonia/acute respiratory failure with hypoxia COVID-19 Labs  Recent Labs (last 2 labs)        Recent Labs    01/20/19 1000 01/21/19 0200 01/22/19 0135  DDIMER 0.47 0.44 0.36  FERRITIN 126 132 126  CRP 6.8* 13.2* 7.9*      Recent Labs       Lab Results  Component Value Date   SARSCOV2NAA POSITIVE (A) 01/18/2019   SARSCOV2NAA NEGATIVE 06/15/2018    -Patient with cavitary lesions bilateral upper lobe see results CT scan below -Prednisone 50 mg BID.  Will give patient short high-dose burst of steroids.  May require long taper when discharged considering damage in his lungs. -12/31 decrease Prednisone 50 mg daily.  At discharge will place on slow taper SATURATION QUALIFICATIONS: (Thisnote is usedto comply with regulatory documentation for home oxygen) Patient Saturations on Room Air at Rest =84% Patient Saturations on Room Air while Ambulating =79% Patient Saturations on6Liters of oxygen while Ambulating = 92% Please briefly explain why patient needs home oxygen: Patient desats to the high 70s on room air while ambulating. Saturation improved to 86% on 2L [place], 89% on 4L [place], and reached 92% while ambulating when increased to 6L [place]. Patient states he was wearing 4L [place] at home when ambulating and was still "somewhat struggling." Patient's O2 returned to 96% on 2L [place] at rest -3 L O2 via [place] titrate to maintain SPO2> 88% -Provide Inogen portable O2 concentrator -Ambulatory referral to pulmonary ([facility]): post COVID 19 and follow up in 3-4 weeks in comments, with Dr Isaiah SergeMannam, Colbert CoyerPraveen   OSA/OHS undiagnosed -Patient with witnessed apneic events will require sleep study upon discharge. -Follow-up with Dr Chilton GreathouseMannam, Praveen  patient will require sleep study  Chronic sinusitis -Afrin x3 days -Flonase BID -Augmentin x21 days  Essential HTN -1/1 increase lisinopril 20 mg daily (home dose 40 mg daily) -1/1 increase Amlodipine 10 mg daily  Acute on CKD stage IIIa (baseline Cr` 1.23) Last Labs          Recent Labs  Lab 01/18/19 1215 01/19/19 0500 01/20/19 0150 01/21/19 0200 01/22/19 0135  CREATININE 2.06* 1.36* 1.04 1.03 1.07    -Back to baseline  Hypophosphatemia -Resolved  Obese -12/31 nutrition consult; good nutrition habits/weight loss    Discharge Diagnoses:  Principal Problem:   Acute on chronic respiratory failure with hypoxia (HCC) Active Problems:   Hypertension   Acute kidney injury superimposed on CKD (HCC)   Pneumonia due to COVID-19 virus   Acute respiratory failure with hypoxia (HCC)   CKD (chronic kidney disease) stage 3, GFR 30-59 ml/min   Failure to thrive in adult   Major depression, chronic   Obesity, Class III, BMI 40-49.9 (morbid obesity) (HCC)   OSA (obstructive sleep apnea)   Acute renal failure superimposed on stage 3a chronic kidney disease (HCC)   Chronic sinusitis   Discharge Condition: Stable  Diet recommendation: Heart healthy  Filed Weights   01/20/19 0500 01/21/19 0500 01/23/19 0500  Weight: 126 kg 123.7 kg 126.4 kg    History of present illness:  69 y.o.WM PMHx HTN; CHF?; CKD stage 3 ;   and recent hospitalization for COVID-19 associated PNA presenting with SOB. He was previously hospitalized from 12/6-16 for acute hypoxic respiratory failure due to COVID-19 PNA and completed steroids  and remdesivir. He was discharged on a Rollator with 2L [place] O2 to be used with exertion. He was discharged to home and hasn't had any wind. He would have to stop to rest going from bedroom to bath to kitchen. He felt like he needed O2 constantly. It felt harder to breathe. If he wasn't wearing O2 his sats would fall to the 70s. His left hand feels numb.  He has pleuritic pain in his chest and side with deep breaths. He went to his PCP today and they sent him back to the ER. He hasn't had much of an appetite, isn't eating much but has been drinking water, tea, coffee.   ED Course:Patient diagnosed about Thanksgiving, d/c on 12/16, doing better. Worsening SOB, 70s on RA. Looks more comfortable on 2L [place] O2. Concern for PE - most other symptoms better but SOB feels worse than prior. Sometimes increases to 4L [place] O2. Mild renal failure. BP 80s, improved with IVF.  Started on empiric heparin for suspected PE. Noted to be on 6 L 12/29. Transferred to [facility] initially on 12/29, however patient was suspected to be non infectious due to COVID diagnosis >21 days ago and here with suspected PE. Plan for transfer back to [facility] for further management.   Hospital Course:  During his hospitalization patient was treated for sequela of recent Covid pneumonia.  Patient had continued SOB/WOB, in addition found to have chronic sinusitis infection which compounded patient's difficulty in breathing.  Patient started on appropriate medication for chronic sinusitis and restarted on steroids.  Has had significant improvement in symptoms.  Will complete 3-week course of treatment for chronic sinusitis and continue a slow steroid taper for sequela of Covid pneumonia.  In addition patient has been referred to outpatient [facility] for their study of patient with continued respiratory sequela from Covid.  Patient will also require sleep study secondary to multiple witnessed apneic events during his hospitalization (undiagnosed OSA/OHS,),     Procedures: 10/29 CT head Wo contrast;-negative acute intracranial pathology -Sinuses/Orbits: Complete opacification of the left maxillary sinus as well as partial opacification of the sphenoid sinuses. No air-fluid level. The mastoid air cells are clear. 10/29 CTA chest PE protocol;-negative PE  -Scattered peripheral  predominant irregular ground-glass densities and nodular consolidation with pleuroparenchymal scarring, favored to reflect sequelae of COVID-19 pneumonia.  -Two cavitary nodules in both upper lobes measuring up to 2.5 cm, which can occur with COVID-19 infection. The differential also includes bacterial or fungal pneumonia. Marland Kitchen  Antibiotics Anti-infectives (From admission, onward)   Start     Dose/Rate Stop   01/20/19 1630  amoxicillin-clavulanate (AUGMENTIN) 875-125 MG per tablet 1 tablet     1 tablet 02/10/19 2359      Discharge Exam: Vitals:   01/22/19 2112 01/23/19 0400 01/23/19 0500 01/23/19 0748  BP:  129/74  125/66  Pulse: 67 (!) 52  76  Resp: (!) 23 12  18   Temp:  (!) 97.5 F (36.4 C)  98.1 F (36.7 C)  TempSrc:  Oral  Oral  SpO2: 92% (!) 89%  93%  Weight:   126.4 kg   Height:        General: A/O x4, positive acute respiratory distress Eyes: negative scleral hemorrhage, negative anisocoria, negative icterus ENT: Negative Runny nose, negative gingival bleeding, Neck:  Negative scars, masses, torticollis, lymphadenopathy, JVD Lungs: Decreased breath sounds bilaterally without wheezes or crackles Cardiovascular: Regular rate and rhythm without murmur gallop or rub normal S1 and S2   Discharge Instructions  Allergies as of 01/23/2019   No Known Allergies     Medication List    STOP taking these medications   furosemide 80 MG tablet Commonly known as: LASIX     TAKE these medications   acetaminophen 500 MG tablet Commonly known as: TYLENOL Take 1,000 mg by mouth every 4 (four) hours as needed for headache (pain).   Acetaminophen-Codeine 300-30 MG tablet Take 1-2 tablets by mouth every 4 (four) hours as needed for pain (max 8 tabs/24 hours).   albuterol 108 (90 Base) MCG/ACT inhaler Commonly known as: VENTOLIN HFA Inhale 2 puffs into the lungs every 4 (four) hours as needed for wheezing or shortness of breath.   allopurinol 100 MG tablet Commonly known as:  ZYLOPRIM Take 100 mg by mouth 2 (two) times a day.   amLODipine 10 MG tablet Commonly known as: NORVASC Take 1 tablet (10 mg total) by mouth every morning. What changed:   medication strength  how much to take   amoxicillin-clavulanate 875-125 MG tablet Commonly known as: AUGMENTIN Take 1 tablet by mouth every 12 (twelve) hours.   benzonatate 100 MG capsule Commonly known as: Tessalon Perles Take 1 capsule (100 mg total) by mouth 3 (three) times daily as needed for cough.   chlorpheniramine-HYDROcodone 10-8 MG/5ML Suer Commonly known as: TUSSIONEX Take 5 mLs by mouth every 12 (twelve) hours as needed for cough.   ferrous sulfate 324 MG Tbec Take 324 mg by mouth daily with breakfast.   fluticasone 50 MCG/ACT nasal spray Commonly known as: FLONASE Place 2 sprays into both nostrils daily.   lisinopril 20 MG tablet Commonly known as: ZESTRIL Take 1 tablet (20 mg total) by mouth daily. What changed:   how much to take  when to take this   oxymetazoline 0.05 % nasal spray Commonly known as: AFRIN Place 1 spray into both nostrils 2 (two) times daily.   pantoprazole 40 MG tablet Commonly known as: PROTONIX Take 1 tablet (40 mg total) by mouth 2 (two) times daily for 30 days.   polyethylene glycol 17 g packet Commonly known as: MIRALAX / GLYCOLAX Take 17 g by mouth daily as needed for mild constipation.   predniSONE 10 MG tablet Commonly known as: DELTASONE Take 2.5 tablets (25 mg total) by mouth daily with breakfast. After discharge.  Day 1-7   predniSONE 10 MG tablet Commonly known as: DELTASONE Take 1 tablet (10 mg total) by mouth daily with breakfast. After discharge day 8 through 14   predniSONE 10 MG tablet Commonly known as: DELTASONE Take 0.5 tablets (5 mg total) by mouth daily with breakfast. After discharge day 15 until completion   sertraline 100 MG tablet Commonly known as: ZOLOFT Take 1 tablet (100 mg total) by mouth daily. What changed:   how  much to take  when to take this            Durable Medical Equipment  (From admission, onward)         Start     Ordered   01/22/19 1740  For home use only DME oxygen  Once    Comments: SATURATION QUALIFICATIONS: (This note is used to comply with regulatory documentation for home oxygen) Patient Saturations on Room Air at Rest = 84% Patient Saturations on Room Air while Ambulating = 79% Patient Saturations on 6 Liters of oxygen while Ambulating = 92% Please briefly explain why patient needs home oxygen: Patient desats to the high 70s on room air while ambulating. Saturation improved to  86% on 2L [place], 89% on 4L [place], and reached 92% while ambulating when increased to 6L [place]. Patient states he was wearing 4L [place] at home when ambulating and was still "somewhat struggling." Patient's O2 returned to 96% on 2L  at rest -3 L O2 via  titrate to maintain SPO2> 88% -Provide Inogen portable O2 concentrator  Question Answer Comment  Length of Need 12 Months   Mode or (Route) Nasal cannula   Liters per Minute 3   Frequency Continuous (stationary and portable oxygen unit needed)   Oxygen conserving device Yes   Oxygen delivery system Gas      01/22/19 1739         No Known Allergies Follow-up Information    [facility] Follow up in 3 week(s).   Specialty: Pulmonology Why: Ambulatory referral to pulmonary: post COVID 19 and follow up in 3-4 weeks in comments Contact information: [address] [place] [phone]       Chilton GreathouseMannam, Praveen, MD. Schedule an appointment as soon as possible for a visit in 3 week(s).   Specialty: Pulmonary Disease Why: Ambulatory referral to pulmonary; post COVID-19 follow-up in 3 to 4 weeks. Patient will also require sleep study. Contact information: [address] [place] [id number] [phone]            The results of significant diagnostics from this hospitalization  (including imaging, microbiology, ancillary and laboratory) are listed below for reference.    Significant Diagnostic Studies: CT HEAD WO CONTRAST  Result Date: 01/19/2019 CLINICAL DATA:  69 year old male with focal neurologic deficit. EXAM: CT HEAD WITHOUT CONTRAST TECHNIQUE: Contiguous axial images were obtained from the base of the skull through the vertex without intravenous contrast. COMPARISON:  Head CT dated 07/10/2012. FINDINGS: Brain: The ventricles and sulci are appropriate in size for the patient's age. Minimal periventricular and deep white matter chronic microvascular ischemic changes noted. There is no acute intracranial hemorrhage. No mass effect or midline shift. No extra-axial fluid collection. Vascular: No hyperdense vessel or unexpected calcification. Skull: Normal. Negative for fracture or focal lesion. Sinuses/Orbits: Complete opacification of the left maxillary sinus as well as partial opacification of the sphenoid sinuses. No air-fluid level. The mastoid air cells are clear. Other: None IMPRESSION: No acute intracranial pathology. Electronically Signed   By: Elgie CollardArash  Radparvar M.D.   On: 01/19/2019 16:54   CT ANGIO CHEST PE W OR WO CONTRAST  Result Date: 01/19/2019 CLINICAL DATA:  Shortness of breath.  COVID positive. EXAM: CT ANGIOGRAPHY CHEST WITH CONTRAST TECHNIQUE: Multidetector CT imaging of the chest was performed using the standard protocol during bolus administration of intravenous contrast. Multiplanar CT image reconstructions and MIPs were obtained to evaluate the vascular anatomy. CONTRAST:  80mL OMNIPAQUE IOHEXOL 350 MG/ML SOLN COMPARISON:  Chest x-ray from yesterday. CT chest dated September 09, 2017. FINDINGS: Cardiovascular: Satisfactory opacification of the pulmonary arteries to the segmental level. No evidence of pulmonary embolism. Normal heart size. No pericardial effusion. No thoracic aortic aneurysm or dissection. Mediastinum/Nodes: Mildly enlarged right paratracheal  lymph node measuring 1.1 cm in short axis, likely reactive. No enlarged hilar or axillary lymph nodes. The thyroid gland, trachea, and esophagus demonstrate no significant findings. Lungs/Pleura: Scattered peripheral predominant irregular ground-glass densities and nodular consolidation with pleuroparenchymal scarring. 1.9 x 2.5 cm cavitary nodule in the right upper lobe. 9 x 9 mm cavitary nodule in the left upper lobe. No pleural effusion or pneumothorax. Upper Abdomen: No acute abnormality.  Musculoskeletal: No chest wall abnormality. No acute or significant osseous findings. Prior T10-T12 posterior decompression. Review of the MIP images confirms the above findings. IMPRESSION: 1. Scattered peripheral predominant irregular ground-glass densities and nodular consolidation with pleuroparenchymal scarring, favored to reflect sequelae of COVID-19 pneumonia. Two cavitary nodules in both upper lobes measuring up to 2.5 cm, which can occur with COVID-19 infection. The differential also includes bacterial or fungal pneumonia. Septic pulmonary emboli are thought to be less likely. 2. No evidence of pulmonary embolism. Electronically Signed   By: Titus Dubin M.D.   On: 01/19/2019 17:26   DG Chest Portable 1 View  Result Date: 01/18/2019 CLINICAL DATA:  COVID, shortness of breath. EXAM: PORTABLE CHEST 1 VIEW COMPARISON:  Chest radiograph 12/26/2018 FINDINGS: Shallow inspiration radiograph, accentuating the cardiac silhouette and resulting in crowding of the central bronchovascular markings. Similar appearance of patchy bilateral airspace opacities as compared to chest radiograph 01/05/2019. No sizable pleural effusion or evidence of pneumothorax. No acute bony abnormality. Overlying cardiac monitoring leads. IMPRESSION: Similar appearance of patchy bilateral airspace opacities as compared to 01/05/2019. The findings are consistent with multifocal pneumonia in the appropriate clinical setting. Electronically Signed    By: Kellie Simmering DO   On: 01/18/2019 15:14   DG CHEST PORT 1 VIEW  Result Date: 01/05/2019 CLINICAL DATA:  Follow-up pneumonia due to COVID-19 infection. EXAM: PORTABLE CHEST 1 VIEW COMPARISON:  01/03/2019 FINDINGS: Bilateral areas of predominantly peripheral patchy airspace opacities are without change. No new lung abnormalities. No pleural effusion or pneumothorax. IMPRESSION: 1. No change in the bilateral patchy airspace lung opacities consistent multifocal COVID-19 pneumonia. No new abnormalities. Electronically Signed   By: Lajean Manes M.D.   On: 01/05/2019 08:12   DG CHEST PORT 1 VIEW  Result Date: 01/03/2019 CLINICAL DATA:  Pneumothorax. EXAM: PORTABLE CHEST 1 VIEW COMPARISON:  January 02, 2019. FINDINGS: The heart size and mediastinal contours are within normal limits. No pneumothorax or pleural effusion is noted. Stable bilateral lung opacities are noted consistent with multifocal pneumonia. The visualized skeletal structures are unremarkable. IMPRESSION: Stable bilateral lung opacities are noted consistent with multifocal pneumonia. Electronically Signed   By: Marijo Conception M.D.   On: 01/03/2019 10:39   DG CHEST PORT 1 VIEW  Result Date: 01/02/2019 CLINICAL DATA:  69 year old male, COVID-19. Portable chest EXAM: PORTABLE CHEST 1 VIEW COMPARISON:  01/01/2019 and earlier. FINDINGS: Portable AP semi upright view at 0559 hours. The small left pneumothorax visible in the apex yesterday is not evident. There might be a small volume of left apical pleural fluid. Stable lung volumes and mediastinal contours. Widely scattered patchy and indistinct opacity, mostly in the lung periphery and bases has progressed since 12/27/2018, not significantly changed since yesterday. Visualized tracheal air column is within normal limits. No acute osseous abnormality identified. IMPRESSION: 1. No left pneumothorax identified today. 2. Bilateral COVID-19 pneumonia stable since yesterday. Electronically Signed    By: Genevie Ann M.D.   On: 01/02/2019 08:25   DG CHEST PORT 1 VIEW  Result Date: 01/01/2019 CLINICAL DATA:  COVID-19 positive patient.  Shortness of breath EXAM: PORTABLE CHEST 1 VIEW COMPARISON:  December 27, 2018 FINDINGS: Increasing patchy pulmonary infiltrates are identified, most marked in the bases, right greater than left. Stable cardiomegaly. The hila and mediastinum are normal. There appears to be a small left apical pneumothorax measuring 11 mm, not seen previously. IMPRESSION: 1. There appears to be a new 11 mm pneumothorax in the left apex. Recommend a PA and lateral  xray for further confirmation. 2. Increasing bilateral pulmonary infiltrates. Findings called to the patient's nurse Melissa. Electronically Signed   By: Gerome Sam III M.D   On: 01/01/2019 11:34   DG Chest Portable 1 View  Result Date: 12/27/2018 CLINICAL DATA:  Tested positive for COVID-19 last week, worsening shortness of breath, productive cough, fatigue, fever, hypoxemia, history hypertension EXAM: PORTABLE CHEST 1 VIEW COMPARISON:  Portable exam 2003 hours compared to 06/15/2018 FINDINGS: Upper normal heart size. Mediastinal contours and pulmonary vascularity normal. Mild LEFT perihilar infiltrate question pneumonia. Remaining lungs clear. No pleural effusion or pneumothorax. No acute osseous findings. IMPRESSION: LEFT perihilar infiltrate question pneumonia. Electronically Signed   By: Ulyses Southward M.D.   On: 12/27/2018 20:36    Microbiology: Recent Results (from the past 240 hour(s))  SARS CORONAVIRUS 2 (TAT 6-24 HRS) Nasopharyngeal Nasopharyngeal Swab     Status: Abnormal   Collection Time: 01/18/19 12:03 PM   Specimen: Nasopharyngeal Swab  Result Value Ref Range Status   SARS Coronavirus 2 POSITIVE (A) NEGATIVE Final    Comment: RESULT CALLED TO, READ BACK BY AND VERIFIED WITH: M.COFFEY RN 1754 01/18/2019 MCCORMICK K (NOTE) SARS-CoV-2 target nucleic acids are DETECTED. The SARS-CoV-2 RNA is generally  detectable in upper and lower respiratory specimens during the acute phase of infection. Positive results are indicative of the presence of SARS-CoV-2 RNA. Clinical correlation with patient history and other diagnostic information is  necessary to determine patient infection status. Positive results do not rule out bacterial infection or co-infection with other viruses.  The expected result is Negative. Fact Sheet for Patients: HairSlick.no Fact Sheet for Healthcare Providers: quierodirigir.com This test is not yet approved or cleared by the [place] FDA and  has been authorized for detection and/or diagnosis of SARS-CoV-2 by FDA under an Emergency Use Authorization (EUA). This EUA will remain  in effect (meaning this test can be used) for  the duration of the COVID-19 declaration under Section 564(b)(1) of the Act, 21 U.S.C. section 360bbb-3(b)(1), unless the authorization is terminated or revoked sooner. Performed at [facility], [address], [place], [place] [address]   Blood Culture (routine x 2)     Status: None (Preliminary result)   Collection Time: 01/18/19 12:03 PM   Specimen: BLOOD  Result Value Ref Range Status   Specimen Description BLOOD RIGHT ANTECUBITAL  Final   Special Requests   Final    BOTTLES DRAWN AEROBIC AND ANAEROBIC Blood Culture adequate volume   Culture   Final    NO GROWTH 4 DAYS Performed at [facility], [address], [place], [place] [address]    Report Status PENDING  Incomplete  Blood Culture (routine x 2)     Status: None (Preliminary result)   Collection Time: 01/18/19 12:08 PM   Specimen: BLOOD  Result Value Ref Range Status   Specimen Description BLOOD BLOOD RIGHT HAND  Final   Special Requests   Final    BOTTLES DRAWN AEROBIC AND ANAEROBIC Blood Culture adequate volume   Culture   Final    NO GROWTH 4 DAYS Performed at [facility], [address]  [place], [place] [address]    Report Status PENDING  Incomplete     Labs: Basic Metabolic Panel: Recent Labs  Lab 01/19/19 0500 01/19/19 1030 01/20/19 0150 01/20/19 1000 01/21/19 0200 01/22/19 0135 01/23/19 0226  NA 139  --  139  --  137 135 137  K 3.7  --  3.9  --  4.4 4.4 4.3  CL 103  --  103  --  102 104 104  CO2 26  --  26  --  25 25 26   GLUCOSE 98  --  90  --  147* 146* 118*  BUN 20  --  21  --  15 21 24*  CREATININE 1.36*  --  1.04  --  1.03 1.07 1.10  CALCIUM 8.3*  --  8.5*  --  8.6* 8.5* 8.6*  MG  --  1.9  --  2.0 2.0 2.2 2.2  PHOS  --   --   --  1.7* 3.3 2.4* 2.8   Liver Function Tests: Recent Labs  Lab 01/19/19 0500 01/20/19 0150 01/21/19 0200 01/22/19 0135 01/23/19 0226  AST 15 15 15  14* 16  ALT 20 19 19 20 21   ALKPHOS 59 61 65 57 61  BILITOT 0.7 0.8 0.4 0.7 0.4  PROT 5.7* 6.1* 6.2* 6.0* 5.9*  ALBUMIN 2.7* 2.9* 3.0* 2.9* 2.8*   No results for input(s): LIPASE, AMYLASE in the last 168 hours. No results for input(s): AMMONIA in the last 168 hours. CBC: Recent Labs  Lab 01/19/19 0500 01/20/19 0150 01/21/19 0200 01/22/19 0135 01/23/19 0226  WBC 7.3 7.2 7.9 7.6 9.1  NEUTROABS 5.0 5.3 7.5 6.9 7.3  HGB 11.4* 11.0* 11.4* 10.5* 10.6*  HCT 36.9* 35.0* 37.3* 33.3* 33.5*  MCV 88.9 88.8 89.9 86.0 87.7  PLT 171 150 155 173 185   Cardiac Enzymes: No results for input(s): CKTOTAL, CKMB, CKMBINDEX, TROPONINI in the last 168 hours. BNP: BNP (last 3 results) Recent Labs    01/18/19 1215  BNP 20.8    ProBNP (last 3 results) No results for input(s): PROBNP in the last 8760 hours.  CBG: No results for input(s): GLUCAP in the last 168 hours.     Signed:  03/22/19, MD [facility] [phone] pager

## 2019-01-23 NOTE — Progress Notes (Signed)
Patient discharged via wheelchair escorted by RN. Pt picked up in personal vehicle driven by granddaughter. MD at bedside prior to discharge reiterating discharge instructions, follow-up appointments, and prescriptions. ALL questions answered. VSS and documented. IVs removed.,

## 2019-02-11 ENCOUNTER — Other Ambulatory Visit: Payer: Self-pay

## 2019-02-15 ENCOUNTER — Telehealth: Payer: Self-pay | Admitting: Primary Care

## 2019-02-15 ENCOUNTER — Emergency Department (HOSPITAL_COMMUNITY): Payer: Medicare Other

## 2019-02-15 ENCOUNTER — Encounter (HOSPITAL_COMMUNITY): Payer: Self-pay | Admitting: Emergency Medicine

## 2019-02-15 ENCOUNTER — Other Ambulatory Visit: Payer: Self-pay

## 2019-02-15 ENCOUNTER — Inpatient Hospital Stay (HOSPITAL_COMMUNITY)
Admission: EM | Admit: 2019-02-15 | Discharge: 2019-02-23 | DRG: 177 | Disposition: A | Discharge status: Rehabilitation Facility | Payer: Medicare Other | Attending: Internal Medicine | Admitting: Internal Medicine

## 2019-02-15 DIAGNOSIS — B948 Sequelae of other specified infectious and parasitic diseases: Secondary | ICD-10-CM

## 2019-02-15 DIAGNOSIS — R269 Unspecified abnormalities of gait and mobility: Secondary | ICD-10-CM | POA: Diagnosis present

## 2019-02-15 DIAGNOSIS — I272 Pulmonary hypertension, unspecified: Secondary | ICD-10-CM | POA: Diagnosis present

## 2019-02-15 DIAGNOSIS — M549 Dorsalgia, unspecified: Secondary | ICD-10-CM | POA: Diagnosis present

## 2019-02-15 DIAGNOSIS — G8929 Other chronic pain: Secondary | ICD-10-CM | POA: Diagnosis present

## 2019-02-15 DIAGNOSIS — Z6838 Body mass index (BMI) 38.0-38.9, adult: Secondary | ICD-10-CM

## 2019-02-15 DIAGNOSIS — F419 Anxiety disorder, unspecified: Secondary | ICD-10-CM | POA: Diagnosis present

## 2019-02-15 DIAGNOSIS — J9601 Acute respiratory failure with hypoxia: Secondary | ICD-10-CM | POA: Diagnosis present

## 2019-02-15 DIAGNOSIS — Z8711 Personal history of peptic ulcer disease: Secondary | ICD-10-CM

## 2019-02-15 DIAGNOSIS — J151 Pneumonia due to Pseudomonas: Principal | ICD-10-CM | POA: Diagnosis present

## 2019-02-15 DIAGNOSIS — I509 Heart failure, unspecified: Secondary | ICD-10-CM

## 2019-02-15 DIAGNOSIS — J9600 Acute respiratory failure, unspecified whether with hypoxia or hypercapnia: Secondary | ICD-10-CM | POA: Diagnosis present

## 2019-02-15 DIAGNOSIS — I130 Hypertensive heart and chronic kidney disease with heart failure and stage 1 through stage 4 chronic kidney disease, or unspecified chronic kidney disease: Secondary | ICD-10-CM | POA: Diagnosis present

## 2019-02-15 DIAGNOSIS — J849 Interstitial pulmonary disease, unspecified: Secondary | ICD-10-CM | POA: Diagnosis present

## 2019-02-15 DIAGNOSIS — I10 Essential (primary) hypertension: Secondary | ICD-10-CM | POA: Diagnosis present

## 2019-02-15 DIAGNOSIS — Z79899 Other long term (current) drug therapy: Secondary | ICD-10-CM

## 2019-02-15 DIAGNOSIS — J9621 Acute and chronic respiratory failure with hypoxia: Secondary | ICD-10-CM | POA: Diagnosis present

## 2019-02-15 DIAGNOSIS — J984 Other disorders of lung: Secondary | ICD-10-CM

## 2019-02-15 DIAGNOSIS — I5032 Chronic diastolic (congestive) heart failure: Secondary | ICD-10-CM | POA: Diagnosis present

## 2019-02-15 DIAGNOSIS — Z8719 Personal history of other diseases of the digestive system: Secondary | ICD-10-CM

## 2019-02-15 DIAGNOSIS — J9620 Acute and chronic respiratory failure, unspecified whether with hypoxia or hypercapnia: Secondary | ICD-10-CM

## 2019-02-15 DIAGNOSIS — Z9181 History of falling: Secondary | ICD-10-CM

## 2019-02-15 DIAGNOSIS — J189 Pneumonia, unspecified organism: Secondary | ICD-10-CM

## 2019-02-15 DIAGNOSIS — Z8616 Personal history of COVID-19: Secondary | ICD-10-CM | POA: Insufficient documentation

## 2019-02-15 DIAGNOSIS — E662 Morbid (severe) obesity with alveolar hypoventilation: Secondary | ICD-10-CM | POA: Diagnosis present

## 2019-02-15 DIAGNOSIS — R197 Diarrhea, unspecified: Secondary | ICD-10-CM | POA: Diagnosis present

## 2019-02-15 DIAGNOSIS — F329 Major depressive disorder, single episode, unspecified: Secondary | ICD-10-CM | POA: Diagnosis present

## 2019-02-15 DIAGNOSIS — K1230 Oral mucositis (ulcerative), unspecified: Secondary | ICD-10-CM | POA: Diagnosis present

## 2019-02-15 DIAGNOSIS — H9201 Otalgia, right ear: Secondary | ICD-10-CM | POA: Diagnosis not present

## 2019-02-15 DIAGNOSIS — N1831 Chronic kidney disease, stage 3a: Secondary | ICD-10-CM | POA: Diagnosis present

## 2019-02-15 DIAGNOSIS — U071 COVID-19: Secondary | ICD-10-CM

## 2019-02-15 DIAGNOSIS — R911 Solitary pulmonary nodule: Secondary | ICD-10-CM | POA: Diagnosis present

## 2019-02-15 DIAGNOSIS — Y95 Nosocomial condition: Secondary | ICD-10-CM | POA: Diagnosis present

## 2019-02-15 MED ORDER — LINEZOLID 600 MG/300ML IV SOLN: 600.0000 mg | Freq: Two times a day (BID) | INTRAVENOUS | Status: DC

## 2019-02-15 MED ORDER — SODIUM CHLORIDE 0.9 % IV SOLN
2.0000 g | INTRAVENOUS | Status: AC
  Administered 2019-02-15: 2 g via INTRAVENOUS
  Filled 2019-02-15: qty 2

## 2019-02-15 MED ORDER — SODIUM CHLORIDE 0.9 % IV BOLUS
500.0000 mL | Freq: Once | INTRAVENOUS | Status: AC
  Administered 2019-02-15: 500 mL via INTRAVENOUS

## 2019-02-15 MED ORDER — ACETAMINOPHEN 500 MG PO TABS
1000.0000 mg | ORAL_TABLET | Freq: Once | ORAL | Status: AC
  Administered 2019-02-15: 1000 mg via ORAL
  Filled 2019-02-15: qty 2

## 2019-02-15 MED ORDER — SODIUM CHLORIDE 0.9 % IV SOLN
2.0000 g | Freq: Three times a day (TID) | INTRAVENOUS | Status: DC
  Administered 2019-02-16 – 2019-02-19 (×9): 2 g via INTRAVENOUS
  Filled 2019-02-15 (×16): qty 2

## 2019-02-15 MED ORDER — LINEZOLID 600 MG/300ML IV SOLN
600.0000 mg | Freq: Once | INTRAVENOUS | Status: DC
  Filled 2019-02-15: qty 300

## 2019-02-15 NOTE — Progress Notes (Signed)
Pharmacy Antibiotic Note  Kurt [name] is a 69 y.o. male admitted on 02/15/2019 with pneumonia - noted pt with h/o COVID pna (diagnosed 11/25 and in-hospital until d/c on 01/23/2019) , CT chest 12/29 showed cavitary pna.  Pharmacy has been consulted for Cefepime dosing. Noted pt also receiving Zyvox (approved by Dr. Ilsa Iha). ID formally consulted.   Plan: Cefepime 2gm IV q8h Linezolid 600mg  IV q12h Will f/u renal function, micro data, and pt's clinical condition F/u ID consult    Height: 5' 10.5" (179.1 cm) Weight: 248 lb (112.5 kg) IBW/kg (Calculated) : 74.15  Temp (24hrs), Avg:99.3 F (37.4 C), Min:98.2 F (36.8 C), Max:100.5 F (38.1 C)  No results for input(s): WBC, CREATININE, LATICACIDVEN, VANCOTROUGH, VANCOPEAK, VANCORANDOM, GENTTROUGH, GENTPEAK, GENTRANDOM, TOBRATROUGH, TOBRAPEAK, TOBRARND, AMIKACINPEAK, AMIKACINTROU, AMIKACIN in the last 168 hours.  CrCl cannot be calculated (Patient's most recent lab result is older than the maximum 21 days allowed.).    No Known Allergies  Antimicrobials this admission: 1/25 Cefepime >>  1/25 Linezolid >>    Microbiology results: 1/25 BCx:   UCx:    Sputum:    MRSA PCR:   Thank you for allowing pharmacy to be a part of this patient's care.  2/25, PharmD, BCPS Please see amion for complete clinical pharmacist phone list 02/15/2019 11:32 PM

## 2019-02-15 NOTE — ED Provider Notes (Signed)
[facility] EMERGENCY DEPARTMENT Provider Note   CSN: [id number] Arrival date & time: 02/15/19  2047     History Chief Complaint  Patient presents with  . Shortness of Breath    Kurt [name] is a 69 y.o. male with a history of COVID-19 (+ on 11/25), chronic diastolic CHF, diverticulitis, obesity, OSA, CKD stage III, and HTN who presents to the emergency department from home with a chief complaint of shortness of breath.  The patient endorses constant shortness of breath for the last few weeks that is significantly worsened over the last few days.  He has a pulse oximeter at home and found that his oxygen will drop into the 40s after approximately 10-15 steps despite being on his 2 L of home O2.  Reports that he has had to significantly increase his home O2 up to 6 L over the last few days.  He reports associated cough but significantly worsens with exertion, chest pain and tightness that is also worse with exertion.  Reports that the chest pain only starts when his oxygen levels dropped.  He also notes tingling in his fingers and his toes with minimal exertion.  He reports that when he was first discharged from the hospital on January 2 that he was able to take his home oxygen and go to his shop and drive himself to and from his home.  However, as symptoms have worsened, he has become extremely weak and fatigued to the point that he does not feel safe leaving his home.  He notes that a week ago Friday that he tripped over his oxygen tubing and fell.  Reports that he landed on his left side.  He does not believe that he hit his head.  No syncope.  States that he was so weak that it took him approximately 45 minutes to stand to get out of the floor.  He also reports that he has been having frequent diarrhea over the last week.  Over the last few days, diarrhea has turned black, but only after taking almost an entire bottle of Pepto-Bismol.  He reports associated abdominal  cramping.  He has been trying to drink fluids, but overall reports poor p.o. intake.  He also endorses a severe, throbbing global headache in the ED.  He was found to be febrile on arrival to the ER, but denies any recent fever or chills at home.  He denies nausea, vomiting, visual changes, rashes, leg swelling, orthopnea, back pain, dysuria, hematuria, urinary hesitancy or frequency, focal numbness or weakness.  Patient reports that he believes that he may have 4 to 5 tablets of Augmentin remaining in his prescription at home.  Brief History:  11/25- The patient tested positive for COVID-19 at an outside facility.   12/6-12/16- Admitted for acute hypoxic respiratory failure due to COVID-19 and COVID-19 associated pneumonia.  He completed a 5-day course of remdesivir and a dexamethasone taper.  He had a small left apical pneumothorax.  After discussing with pulmonology, he was placed on a nonrebreather for 24 hours and pneumothorax resolved.  He was discharged with home oxygen on 2L and a rolling walker.  12/28-01/02- Admitted after he returned with worsening shortness of breath and hypoxia in the 70s.  He had increased his home oxygen to 6 L.  There was concern for PE, but PE study was negative, but showed Two cavitary nodules in both upper lobes measuring up to 2.5 cm, which can occur with COVID-19 infection. The differential also  includes bacterial or fungal pneumonia. Septic pulmonary emboli are thought to be less likely. He was treated for sequelae of recent COVID pneumonia.  He was also found to have a chronic sinus infection and was treated with a 21-day course of Augmentin, Flonase, and Afrin.  He was restarted on 50 mg of prednisone twice daily and discharged with a 15-day prednisone taper.    The history is provided by the patient. No language interpreter was used.       Past Medical History:  Diagnosis Date  . Arthritis   . CHF (congestive heart failure) (HCC)    diastolic  dysfunction  . Chronic back pain   . COVID-19   . Depression   . Diverticulitis   . GI bleed 05/2018  . Hypertension   . PTSD (post-traumatic stress disorder)    after  a Fall   . Renal insufficiency    Decrease in GFR- PCP removed all NSAIDS & aspirin     Patient Active Problem List   Diagnosis Date Noted  . Multifocal pneumonia 02/16/2019  . Diarrhea 02/16/2019  . OSA (obstructive sleep apnea) 01/20/2019  . Acute renal failure superimposed on stage 3a chronic kidney disease (HCC) 01/20/2019  . Chronic sinusitis 01/20/2019  . Acute on chronic respiratory failure with hypoxia (HCC) 01/18/2019  . Failure to thrive in adult 01/18/2019  . Major depression, chronic 01/18/2019  . Obesity, Class III, BMI 40-49.9 (morbid obesity) (HCC) 01/18/2019  . Pneumonia due to COVID-19 virus 12/27/2018  . Acute respiratory failure with hypoxia (HCC) 12/27/2018  . CKD (chronic kidney disease) stage 3, GFR 30-59 ml/min 12/27/2018  . CHF (congestive heart failure) (HCC) 12/27/2018  . QT prolongation 06/15/2018  . 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 GI bleed 04/29/2014  . Acute kidney injury superimposed on CKD (HCC) 04/29/2014  . TIA (transient ischemic attack) 07/08/2012  . Diverticulitis 07/08/2012  . Hypertension 07/08/2012    Past Surgical History:  Procedure Laterality Date  . BIOPSY  06/16/2018   Procedure: BIOPSY;  Surgeon: Hilarie FredricksonPerry, John N, MD;  [facility];  Service: Endoscopy;;  . ELBOW FRACTURE SURGERY     ORIF, Shoulder manipulation    . ESOPHAGOGASTRODUODENOSCOPY N/A 04/30/2014   Procedure: ESOPHAGOGASTRODUODENOSCOPY (EGD);  Surgeon: Jeani HawkingPatrick Hung, MD;  [facility];  Service: Endoscopy;  Laterality: N/A;  . ESOPHAGOGASTRODUODENOSCOPY (EGD) WITH PROPOFOL N/A 06/16/2018   Procedure: ESOPHAGOGASTRODUODENOSCOPY (EGD) WITH PROPOFOL;  Surgeon: Hilarie FredricksonPerry, John N, MD;  [facility];  Service: Endoscopy;  Laterality: N/A;  . HEMOSTASIS CLIP PLACEMENT  06/16/2018   Procedure: HEMOSTASIS CLIP PLACEMENT;  Surgeon: Hilarie FredricksonPerry, John N, MD;  [facility];  Service: Endoscopy;;  . LUMBAR LAMINECTOMY/DECOMPRESSION MICRODISCECTOMY N/A 02/16/2015   Procedure: Thoracic ten - thoracic twelve laminectomy;  Surgeon: Julio SicksHenry Pool, MD;  Location: [facility] NEURO [facility]S;  Service: Neurosurgery;  Laterality: N/A;  . LUMBAR LAMINECTOMY/DECOMPRESSION MICRODISCECTOMY N/A 03/22/2016   Procedure: Lumbar One-Two, Lumbar Two-Three, Lumbar Three-Four Laminectomy and Foraminotomy;  Surgeon: Julio SicksHenry Pool, MD;  [facility];  Service: Neurosurgery;  Laterality: N/A;  . NASAL SEPTUM SURGERY    . SCLEROTHERAPY  06/16/2018   Procedure: SCLEROTHERAPY;  Surgeon: Hilarie FredricksonPerry, John N, MD;  [facility];  Service: Endoscopy;;       History reviewed. No pertinent family history.  Social History   Tobacco Use  . Smoking status: Never Smoker  . Smokeless tobacco: Never Used  Substance Use Topics  .  Alcohol use: Not Currently    Comment: quit in 1989   . Drug use: No    Home Medications Prior to Admission medications   Medication Sig Start Date End Date Taking? Authorizing Provider  acetaminophen (TYLENOL) 500 MG tablet Take 1,000 mg by mouth every 4 (four) hours as needed for headache (pain).    [provider]  Acetaminophen-Codeine 300-30 MG tablet Take 1-2 tablets by mouth every 4 (four) hours as needed for pain (max 8 tabs/24 hours).  10/26/18   [provider]  albuterol (VENTOLIN HFA) 108 (90 Base) MCG/ACT inhaler Inhale 2 puffs into the lungs every 4 (four) hours as needed for wheezing or shortness of breath.  12/27/18   [provider]  allopurinol (ZYLOPRIM) 100 MG tablet Take 100 mg by mouth 2 (two) times a day. 05/16/18   [provider]  amLODipine (NORVASC) 10 MG tablet Take 1 tablet (10 mg total) by mouth every morning. 01/23/19   Drema Dallas, MD   amoxicillin-clavulanate (AUGMENTIN) 875-125 MG tablet Take 1 tablet by mouth every 12 (twelve) hours. 01/23/19   Drema Dallas, MD  benzonatate (TESSALON PERLES) 100 MG capsule Take 1 capsule (100 mg total) by mouth 3 (three) times daily as needed for cough. 01/04/19 01/04/20  Osvaldo Shipper, MD  chlorpheniramine-HYDROcodone (TUSSIONEX) 10-8 MG/5ML SUER Take 5 mLs by mouth every 12 (twelve) hours as needed for cough. 01/23/19   Drema Dallas, MD  ferrous sulfate 324 MG TBEC Take 324 mg by mouth daily with breakfast.    [provider]  fluticasone (FLONASE) 50 MCG/ACT nasal spray Place 2 sprays into both nostrils daily. 01/23/19   Drema Dallas, MD  lisinopril (ZESTRIL) 20 MG tablet Take 1 tablet (20 mg total) by mouth daily. 01/23/19   Drema Dallas, MD  oxymetazoline (AFRIN) 0.05 % nasal spray Place 1 spray into both nostrils 2 (two) times daily. 01/23/19   Drema Dallas, MD  pantoprazole (PROTONIX) 40 MG tablet Take 1 tablet (40 mg total) by mouth 2 (two) times daily for 30 days. 06/18/18 03/22/19  [place], Alvira Philips, DO  polyethylene glycol (MIRALAX / GLYCOLAX) 17 g packet Take 17 g by mouth daily as needed for mild constipation. 01/23/19   Drema Dallas, MD  predniSONE (DELTASONE) 10 MG tablet Take 2.5 tablets (25 mg total) by mouth daily with breakfast. After discharge.  Day 1-7 01/23/19   Drema Dallas, MD  predniSONE (DELTASONE) 10 MG tablet Take 1 tablet (10 mg total) by mouth daily with breakfast. After discharge day 8 through 14 01/23/19   Drema Dallas, MD  predniSONE (DELTASONE) 10 MG tablet Take 0.5 tablets (5 mg total) by mouth daily with breakfast. After discharge day 15 until completion 01/23/19   Drema Dallas, MD  sertraline (ZOLOFT) 100 MG tablet Take 1 tablet (100 mg total) by mouth daily. Patient taking differently: Take 200 mg by mouth every morning.  07/11/12   Drema Dallas, MD    Allergies    Patient has no known allergies.  Review of Systems   Review of Systems   Constitutional: Negative for appetite change, chills, diaphoresis and fever.  HENT: Negative for congestion, sinus pressure, sinus pain, sore throat, trouble swallowing and voice change.   Respiratory: Positive for cough, chest tightness and shortness of breath.   Cardiovascular: Negative for chest pain, palpitations and leg swelling.  Gastrointestinal: Positive for abdominal pain and diarrhea. Negative for blood in stool, nausea and vomiting.  Genitourinary: Negative for dysuria and urgency.  Musculoskeletal: Negative for arthralgias, back pain, gait problem, myalgias, neck pain and neck stiffness.  Skin: Negative for rash.  Allergic/Immunologic: Negative for immunocompromised state.  Neurological: Positive for headaches. Negative for seizures, syncope, weakness and numbness.  Psychiatric/Behavioral: Negative for confusion.    Physical Exam Updated Vital Signs BP (!) 141/73   Pulse 86   Temp (!) 100.5 F (38.1 C) (Oral)   Resp 17   Ht 5' 10.5" (1.791 m)   Wt 112.5 kg   SpO2 93%   BMI 35.08 kg/m   Physical Exam Vitals and nursing note reviewed.  Constitutional:      General: He is not in acute distress.    Appearance: He is well-developed. He is obese. He is ill-appearing. He is not diaphoretic.     Comments: Nasal cannula in place.  HENT:     Head: Normocephalic.  Eyes:     Conjunctiva/sclera: Conjunctivae normal.  Cardiovascular:     Rate and Rhythm: Regular rhythm. Tachycardia present.     Pulses: Normal pulses.     Heart sounds: Normal heart sounds. No murmur. No friction rub. No gallop.   Pulmonary:     Effort: Pulmonary effort is normal.     Comments: Increased work of breathing. Mild tachypnea. Tachypnea significantly increases with minimal exertion, including positional changes within the bed. No hypoxia is noted while patient is on nasal cannula with 6 L O2. Bibasilar rales are noted. No rhonchi or wheezes. Air movement is diminished throughout. Abdominal:      General: There is no distension.     Palpations: Abdomen is soft. There is no mass.     Tenderness: There is abdominal tenderness. There is no right CVA tenderness, left CVA tenderness, guarding or rebound.     Hernia: No hernia is present.     Comments: Mild diffuse tenderness to palpation throughout the abdomen without rebound or guarding.  Musculoskeletal:     Cervical back: Neck supple.     Right lower leg: No edema.     Left lower leg: No edema.  Skin:    General: Skin is warm and dry.     Capillary Refill: Capillary refill takes less than 2 seconds.  Neurological:     Mental Status: He is alert.  Psychiatric:        Behavior: Behavior normal.     ED Results / Procedures / Treatments   Labs (all labs ordered are listed, but only abnormal results are displayed) Labs Reviewed  CBC WITH DIFFERENTIAL/PLATELET - Abnormal; Notable for the following components:      Result Value   WBC 11.3 (*)    RBC 4.15 (*)    Hemoglobin 11.5 (*)    HCT 36.5 (*)    Neutro Abs 9.0 (*)    Eosinophils Absolute 0.8 (*)    Abs Immature Granulocytes 0.10 (*)    All other components within normal limits  COMPREHENSIVE METABOLIC PANEL - Abnormal; Notable for the following components:   Glucose, Bld 119 (*)    Calcium 8.6 (*)    Total Protein 6.2 (*)    Albumin 2.6 (*)    All other components within normal limits  LACTATE DEHYDROGENASE - Abnormal; Notable for the following components:   LDH 248 (*)    All other components within normal limits  EXPECTORATED SPUTUM ASSESSMENT W REFEX TO RESP CULTURE  CULTURE, RESPIRATORY  CULTURE, BLOOD (ROUTINE X 2)  CULTURE, BLOOD (ROUTINE X 2)  URINE  CULTURE  MRSA PCR SCREENING  SARS CORONAVIRUS 2 (TAT 6-24 HRS)  C DIFFICILE QUICK SCREEN W PCR REFLEX  GI PATHOGEN PANEL BY PCR, STOOL  RESPIRATORY PANEL BY PCR  LACTIC ACID, PLASMA  URINALYSIS, ROUTINE W REFLEX MICROSCOPIC  MAGNESIUM  BRAIN NATRIURETIC PEPTIDE  PROCALCITONIN  PROTIME-INR  APTT   INFLUENZA PANEL BY PCR (TYPE A & B)  FERRITIN  FIBRINOGEN  D-DIMER, QUANTITATIVE (NOT AT [facility])  C-REACTIVE PROTEIN  QUANTIFERON-TB GOLD PLUS  TROPONIN I (HIGH SENSITIVITY)  TROPONIN I (HIGH SENSITIVITY)    EKG None  Radiology DG Chest 2 View  Result Date: 02/15/2019 CLINICAL DATA:  Shortness of breath EXAM: CHEST - 2 VIEW COMPARISON:  01/18/2019 FINDINGS: Cardiomegaly. Diffuse interstitial prominence and patchy bilateral airspace disease again noted, similar to prior study. Heart is mildly enlarged. No effusions or acute bony abnormality. IMPRESSION: Diffuse interstitial prominence and patchy bilateral airspace disease, similar to prior study. Findings concerning for multifocal pneumonia. Electronically Signed   By: Charlett Nose M.D.   On: 02/15/2019 21:32   CT Angio Chest PE W and/or Wo Contrast  Result Date: 02/16/2019 CLINICAL DATA:  Shortness of breath EXAM: CT ANGIOGRAPHY CHEST WITH CONTRAST TECHNIQUE: Multidetector CT imaging of the chest was performed using the standard protocol during bolus administration of intravenous contrast. Multiplanar CT image reconstructions and MIPs were obtained to evaluate the vascular anatomy. CONTRAST:  31mL OMNIPAQUE IOHEXOL 350 MG/ML SOLN COMPARISON:  01/19/2019 FINDINGS: Cardiovascular: No filling defects in the pulmonary arteries to suggest pulmonary emboli. Mild cardiomegaly. Aorta normal caliber. Mediastinum/Nodes: Mildly prominent right paratracheal lymph nodes with index node measuring 14 mm, stable since prior study. No axillary or hilar adenopathy. Lungs/Pleura: Extensive ground-glass airspace opacities throughout both lungs, worsening since prior study. No effusions. 18 mm nodule in the right middle lobe. This was present on prior study, partially cavitary and measuring 21 mm at that time. Upper Abdomen: Imaging into the upper abdomen shows no acute findings. Musculoskeletal: Chest wall soft tissues are unremarkable. No acute bony abnormality.  Review of the MIP images confirms the above findings. IMPRESSION: Worsening extensive bilateral ground-glass airspace opacities most compatible with pneumonia. 18 mm nodule in the right middle lobe. This nodule was 21 mm on prior study and partially cavitary. Mildly enlarged right paratracheal lymph nodes, stable. No evidence of pulmonary embolus. Cardiomegaly. Electronically Signed   By: Charlett Nose M.D.   On: 02/16/2019 02:12    Procedures .Critical Care Performed by: Barkley Boards, PA-C Authorized by: Barkley Boards, PA-C   Critical care provider statement:    Critical care time (minutes):  55   Critical care time was exclusive of:  Teaching time and separately billable procedures and treating other patients   Critical care was necessary to treat or prevent imminent or life-threatening deterioration of the following conditions:  Sepsis   Critical care was time spent personally by me on the following activities:  Ordering and performing treatments and interventions, ordering and review of laboratory studies, ordering and review of radiographic studies, pulse oximetry, re-evaluation of patient's condition, review of old charts, examination of patient, evaluation of patient's response to treatment, development of treatment plan with patient or surrogate and obtaining history from patient or surrogate   I assumed direction of critical care for this patient from another provider in my specialty: no     (including critical care time)  Medications Ordered in ED Medications  ceFEPIme (MAXIPIME) 2 g in sodium chloride 0.9 % 100 mL IVPB (has no administration  in time range)  linezolid (ZYVOX) IVPB 600 mg (0 mg Intravenous Stopped 02/16/19 0132)  albuterol (VENTOLIN HFA) 108 (90 Base) MCG/ACT inhaler 2 puff (has no administration in time range)  dexamethasone (DECADRON) injection 6 mg (has no administration in time range)  guaiFENesin-dextromethorphan (ROBITUSSIN DM) 100-10 MG/5ML syrup 10 mL (has  no administration in time range)  chlorpheniramine-HYDROcodone (TUSSIONEX) 10-8 MG/5ML suspension 5 mL (has no administration in time range)  acetaminophen (TYLENOL) tablet 650 mg (has no administration in time range)  enoxaparin (LOVENOX) injection 40 mg (has no administration in time range)  sodium chloride 0.9 % bolus 500 mL (0 mLs Intravenous Stopped 02/16/19 0222)  acetaminophen (TYLENOL) tablet 1,000 mg (1,000 mg Oral Given 02/15/19 2341)  ceFEPIme (MAXIPIME) 2 g in sodium chloride 0.9 % 100 mL IVPB (0 g Intravenous Stopped 02/16/19 0023)  oxyCODONE-acetaminophen (PERCOCET/ROXICET) 5-325 MG per tablet 1 tablet (1 tablet Oral Given 02/16/19 0225)  iohexol (OMNIPAQUE) 350 MG/ML injection 75 mL (75 mLs Intravenous Contrast Given 02/16/19 0204)    ED Course  I have reviewed the triage vital signs and the nursing notes.  Pertinent labs & imaging results that were available during my care of the patient were reviewed by me and considered in my medical decision making (see chart for details).    MDM Rules/Calculators/A&P                      69 year old male with a history of COVID-19 (+ on 11/25), chronic diastolic CHF, diverticulitis, obesity, OSA, CKD stage III, and HTN who presents to the emergency department with shortness of breath, generalized weakness, fatigue, abdominal cramping, and diarrhea.  The patient has had multiple hospitalizations for COVID-19 since he originally tested positive on November 25. He was most recently discharged on January 2. He reports initial improvement for a couple of days before his overall health status significantly declined. He has been documenting pulse ox in the 40s at home with taking less than 15 steps.   Oral temp of 100.5 on arrival. He is tachycardic and tachypneic. Nasal cannula in place on 6 L despite being discharged home on 2 L after his most recent hospitalization. The patient was seen and independently evaluated by Dr. Fredderick PhenixBelfi, attending physician.  Code sepsis initiated.  The patient's medical record was extensively reviewed. Patient had a CT PE study on 12/29 demonstrating 2 cavitary nodules in the bilateral upper lobes that can occur in Covid pneumonia versus bacterial or fungal pneumonia.  Less likely thought to be septic pulmonary emboli.  Reviewed CT chest from 2019 and 2016 where no cavitary lesions were noted.    Given patient's prolonged illness with continued concern for multifocal pneumonia approximately 2 months out from Covid diagnosis, consulted infectious disease and spoke with Dr. Jerolyn Centerynthia Snyder for treatment recommendations.  She recommends ordering a MRSA PCR, procalcitonin level, sputum culture, and treating the patient with linezolid or vancomycin and cefepime for HCAP.  She also recommends repeating imaging of the chest.  If cavitary lesions are increased, she recommends placing the patient on airborne precautions and ruling out TB.  Infectious disease will formally consult and follow the patient.  Consult and recommendation appreciated.  Patient has been endorsing frequent diarrhea for the last week. No metabolic derangements. I suspect this may be from either a prolonged course of Augmentin, which may also be contributing to the abdominal cramping that he has been having versus continuing symptoms related to COVID-19.  EKG with sinus tachycardia, but unchanged from  previous. Delta troponin is not elevated. BNP is not elevated. Labs are overall grossly reassuring.  Repeat PE study with worsening extensive bilateral groundglass airspace opacities most consistent with pneumonia. There continues to be a nodule in the right middle lobe that is now measuring 18 mm, decreased from 21 mm on prior study and partially cavitary. There are also mildly enlarged right paratracheal lymph nodes that appear stable. However, there is no evidence of PE.  Consult to the hospitalist team and Dr. Marlowe Sax for will accept the patient for admission.  The patient appears reasonably stabilized for admission considering the current resources, flow, and capabilities available in the ED at this time, and I doubt any other [facility] requiring further screening and/or treatment in the ED prior to admission.  Final Clinical Impression(s) / ED Diagnoses Final diagnoses:  Multifocal pneumonia  COVID-19  Acute on chronic respiratory failure, unspecified whether with hypoxia or hypercapnia ([place])  Pulmonary nodule    Rx / DC Orders ED Discharge Orders    None       Zakar Brosch A, PA-C 02/16/19 0602    Malvin Johns, MD 02/17/19 1126

## 2019-02-15 NOTE — Telephone Encounter (Signed)
Patient is not established with our practice and will need to be seen by a doctor before seeing a NP.   Left message for patient to call back.

## 2019-02-15 NOTE — Progress Notes (Signed)
Spoke with RN regarding PIV needs; states consult no longer needed.

## 2019-02-15 NOTE — ED Triage Notes (Signed)
Pt c/o increased shortness of breath, reports that he is having to increase his O2 at home while ambulating around the house. Pt speaking in short sentences. Diagnosed with COVID x 4 weeks ago.

## 2019-02-16 ENCOUNTER — Emergency Department (HOSPITAL_COMMUNITY): Payer: Medicare Other

## 2019-02-16 ENCOUNTER — Encounter (HOSPITAL_COMMUNITY): Payer: Self-pay | Admitting: Radiology

## 2019-02-16 DIAGNOSIS — K1230 Oral mucositis (ulcerative), unspecified: Secondary | ICD-10-CM | POA: Diagnosis present

## 2019-02-16 DIAGNOSIS — J9600 Acute respiratory failure, unspecified whether with hypoxia or hypercapnia: Secondary | ICD-10-CM | POA: Diagnosis present

## 2019-02-16 DIAGNOSIS — U071 COVID-19: Secondary | ICD-10-CM

## 2019-02-16 DIAGNOSIS — Z79899 Other long term (current) drug therapy: Secondary | ICD-10-CM | POA: Diagnosis not present

## 2019-02-16 DIAGNOSIS — Y95 Nosocomial condition: Secondary | ICD-10-CM | POA: Diagnosis present

## 2019-02-16 DIAGNOSIS — I5032 Chronic diastolic (congestive) heart failure: Secondary | ICD-10-CM | POA: Diagnosis present

## 2019-02-16 DIAGNOSIS — R911 Solitary pulmonary nodule: Secondary | ICD-10-CM | POA: Diagnosis present

## 2019-02-16 DIAGNOSIS — E662 Morbid (severe) obesity with alveolar hypoventilation: Secondary | ICD-10-CM | POA: Diagnosis present

## 2019-02-16 DIAGNOSIS — R42 Dizziness and giddiness: Secondary | ICD-10-CM | POA: Diagnosis not present

## 2019-02-16 DIAGNOSIS — J9601 Acute respiratory failure with hypoxia: Secondary | ICD-10-CM | POA: Diagnosis not present

## 2019-02-16 DIAGNOSIS — F419 Anxiety disorder, unspecified: Secondary | ICD-10-CM | POA: Diagnosis present

## 2019-02-16 DIAGNOSIS — Z8711 Personal history of peptic ulcer disease: Secondary | ICD-10-CM | POA: Diagnosis not present

## 2019-02-16 DIAGNOSIS — H9201 Otalgia, right ear: Secondary | ICD-10-CM | POA: Diagnosis not present

## 2019-02-16 DIAGNOSIS — M549 Dorsalgia, unspecified: Secondary | ICD-10-CM | POA: Diagnosis present

## 2019-02-16 DIAGNOSIS — J181 Lobar pneumonia, unspecified organism: Secondary | ICD-10-CM | POA: Diagnosis not present

## 2019-02-16 DIAGNOSIS — R5381 Other malaise: Secondary | ICD-10-CM | POA: Diagnosis not present

## 2019-02-16 DIAGNOSIS — J849 Interstitial pulmonary disease, unspecified: Secondary | ICD-10-CM | POA: Diagnosis present

## 2019-02-16 DIAGNOSIS — J151 Pneumonia due to Pseudomonas: Secondary | ICD-10-CM | POA: Diagnosis present

## 2019-02-16 DIAGNOSIS — F329 Major depressive disorder, single episode, unspecified: Secondary | ICD-10-CM | POA: Diagnosis present

## 2019-02-16 DIAGNOSIS — I509 Heart failure, unspecified: Secondary | ICD-10-CM | POA: Diagnosis not present

## 2019-02-16 DIAGNOSIS — Z6838 Body mass index (BMI) 38.0-38.9, adult: Secondary | ICD-10-CM | POA: Diagnosis not present

## 2019-02-16 DIAGNOSIS — B948 Sequelae of other specified infectious and parasitic diseases: Secondary | ICD-10-CM | POA: Diagnosis not present

## 2019-02-16 DIAGNOSIS — I272 Pulmonary hypertension, unspecified: Secondary | ICD-10-CM | POA: Diagnosis present

## 2019-02-16 DIAGNOSIS — J984 Other disorders of lung: Secondary | ICD-10-CM | POA: Diagnosis not present

## 2019-02-16 DIAGNOSIS — R269 Unspecified abnormalities of gait and mobility: Secondary | ICD-10-CM | POA: Diagnosis present

## 2019-02-16 DIAGNOSIS — G4733 Obstructive sleep apnea (adult) (pediatric): Secondary | ICD-10-CM | POA: Diagnosis not present

## 2019-02-16 DIAGNOSIS — J9621 Acute and chronic respiratory failure with hypoxia: Secondary | ICD-10-CM | POA: Diagnosis present

## 2019-02-16 DIAGNOSIS — Z9181 History of falling: Secondary | ICD-10-CM | POA: Diagnosis not present

## 2019-02-16 DIAGNOSIS — N1831 Chronic kidney disease, stage 3a: Secondary | ICD-10-CM | POA: Diagnosis present

## 2019-02-16 DIAGNOSIS — R0602 Shortness of breath: Secondary | ICD-10-CM | POA: Diagnosis not present

## 2019-02-16 DIAGNOSIS — R197 Diarrhea, unspecified: Secondary | ICD-10-CM | POA: Diagnosis present

## 2019-02-16 DIAGNOSIS — F4310 Post-traumatic stress disorder, unspecified: Secondary | ICD-10-CM | POA: Diagnosis not present

## 2019-02-16 DIAGNOSIS — R0902 Hypoxemia: Secondary | ICD-10-CM | POA: Diagnosis not present

## 2019-02-16 DIAGNOSIS — J189 Pneumonia, unspecified organism: Secondary | ICD-10-CM | POA: Diagnosis not present

## 2019-02-16 DIAGNOSIS — R918 Other nonspecific abnormal finding of lung field: Secondary | ICD-10-CM | POA: Diagnosis not present

## 2019-02-16 DIAGNOSIS — I130 Hypertensive heart and chronic kidney disease with heart failure and stage 1 through stage 4 chronic kidney disease, or unspecified chronic kidney disease: Secondary | ICD-10-CM | POA: Diagnosis present

## 2019-02-16 DIAGNOSIS — R202 Paresthesia of skin: Secondary | ICD-10-CM | POA: Diagnosis not present

## 2019-02-16 DIAGNOSIS — Z8701 Personal history of pneumonia (recurrent): Secondary | ICD-10-CM | POA: Diagnosis not present

## 2019-02-16 DIAGNOSIS — Z8719 Personal history of other diseases of the digestive system: Secondary | ICD-10-CM | POA: Diagnosis not present

## 2019-02-16 DIAGNOSIS — Z9889 Other specified postprocedural states: Secondary | ICD-10-CM | POA: Diagnosis not present

## 2019-02-16 DIAGNOSIS — Z8616 Personal history of COVID-19: Secondary | ICD-10-CM | POA: Diagnosis not present

## 2019-02-16 DIAGNOSIS — G8929 Other chronic pain: Secondary | ICD-10-CM | POA: Diagnosis present

## 2019-02-16 LAB — URINALYSIS, ROUTINE W REFLEX MICROSCOPIC
Bilirubin Urine: NEGATIVE
Glucose, UA: NEGATIVE mg/dL
Hgb urine dipstick: NEGATIVE
Ketones, ur: NEGATIVE mg/dL
Leukocytes,Ua: NEGATIVE
Nitrite: NEGATIVE
Protein, ur: NEGATIVE mg/dL
Specific Gravity, Urine: 1.029 (ref 1.005–1.030)
pH: 6 (ref 5.0–8.0)

## 2019-02-16 LAB — BRAIN NATRIURETIC PEPTIDE: B Natriuretic Peptide: 47.6 pg/mL (ref 0.0–100.0)

## 2019-02-16 LAB — RESPIRATORY PANEL BY PCR

## 2019-02-16 LAB — CBC WITH DIFFERENTIAL/PLATELET
Abs Immature Granulocytes: 0.1 10*3/uL — ABNORMAL HIGH (ref 0.00–0.07)
Basophils Absolute: 0.1 10*3/uL (ref 0.0–0.1)
Basophils Relative: 1 %
Eosinophils Absolute: 0.8 10*3/uL — ABNORMAL HIGH (ref 0.0–0.5)
Eosinophils Relative: 7 %
HCT: 36.5 % — ABNORMAL LOW (ref 39.0–52.0)
Hemoglobin: 11.5 g/dL — ABNORMAL LOW (ref 13.0–17.0)
Immature Granulocytes: 1 %
Lymphocytes Relative: 6 %
Lymphs Abs: 0.7 10*3/uL (ref 0.7–4.0)
MCH: 27.7 pg (ref 26.0–34.0)
MCHC: 31.5 g/dL (ref 30.0–36.0)
MCV: 88 fL (ref 80.0–100.0)
Monocytes Absolute: 0.7 10*3/uL (ref 0.1–1.0)
Monocytes Relative: 6 %
Neutro Abs: 9 10*3/uL — ABNORMAL HIGH (ref 1.7–7.7)
Neutrophils Relative %: 79 %
Platelets: 382 10*3/uL (ref 150–400)
RBC: 4.15 MIL/uL — ABNORMAL LOW (ref 4.22–5.81)
RDW: 14.6 % (ref 11.5–15.5)
WBC: 11.3 10*3/uL — ABNORMAL HIGH (ref 4.0–10.5)
nRBC: 0 % (ref 0.0–0.2)

## 2019-02-16 LAB — COMPREHENSIVE METABOLIC PANEL
ALT: 26 U/L (ref 0–44)
AST: 25 U/L (ref 15–41)
Albumin: 2.6 g/dL — ABNORMAL LOW (ref 3.5–5.0)
Alkaline Phosphatase: 67 U/L (ref 38–126)
Anion gap: 7 (ref 5–15)
BUN: 13 mg/dL (ref 8–23)
CO2: 25 mmol/L (ref 22–32)
Calcium: 8.6 mg/dL — ABNORMAL LOW (ref 8.9–10.3)
Chloride: 108 mmol/L (ref 98–111)
Creatinine, Ser: 0.93 mg/dL (ref 0.61–1.24)
GFR calc Af Amer: >60 mL/min (ref >60)
GFR calc non Af Amer: >60 mL/min (ref >60)
Glucose, Bld: 119 mg/dL — ABNORMAL HIGH (ref 70–99)
Potassium: 4.5 mmol/L (ref 3.5–5.1)
Sodium: 140 mmol/L (ref 135–145)
Total Bilirubin: 0.7 mg/dL (ref 0.3–1.2)
Total Protein: 6.2 g/dL — ABNORMAL LOW (ref 6.5–8.1)

## 2019-02-16 LAB — LACTATE DEHYDROGENASE: LDH: 248 U/L — ABNORMAL HIGH (ref 98–192)

## 2019-02-16 LAB — INFLUENZA PANEL BY PCR (TYPE A & B)
Influenza A By PCR: NEGATIVE
Influenza B By PCR: NEGATIVE

## 2019-02-16 LAB — PROTIME-INR
INR: 1.2 (ref 0.8–1.2)
Prothrombin Time: 14.7 seconds (ref 11.4–15.2)

## 2019-02-16 LAB — TROPONIN I (HIGH SENSITIVITY)
Troponin I (High Sensitivity): 4 ng/L (ref <18)
Troponin I (High Sensitivity): 7 ng/L (ref <18)

## 2019-02-16 LAB — MRSA PCR SCREENING: MRSA by PCR: NEGATIVE

## 2019-02-16 LAB — FIBRINOGEN: Fibrinogen: 711 mg/dL — ABNORMAL HIGH (ref 210–475)

## 2019-02-16 LAB — PROCALCITONIN: Procalcitonin: <0.1 ng/mL

## 2019-02-16 LAB — D-DIMER, QUANTITATIVE: D-Dimer, Quant: 1.1 ug/mL-FEU — ABNORMAL HIGH (ref 0.00–0.50)

## 2019-02-16 LAB — URINE CULTURE: Culture: NO GROWTH

## 2019-02-16 LAB — SARS CORONAVIRUS 2 (TAT 6-24 HRS): SARS Coronavirus 2: NEGATIVE

## 2019-02-16 LAB — FERRITIN: Ferritin: 145 ng/mL (ref 24–336)

## 2019-02-16 LAB — LACTIC ACID, PLASMA: Lactic Acid, Venous: 0.7 mmol/L (ref 0.5–1.9)

## 2019-02-16 LAB — C-REACTIVE PROTEIN: CRP: 11.9 mg/dL — ABNORMAL HIGH (ref <1.0)

## 2019-02-16 LAB — APTT: aPTT: 35 seconds (ref 24–36)

## 2019-02-16 LAB — MAGNESIUM: Magnesium: 2.1 mg/dL (ref 1.7–2.4)

## 2019-02-16 MED ORDER — IPRATROPIUM-ALBUTEROL 0.5-2.5 (3) MG/3ML IN SOLN
3.0000 mL | Freq: Three times a day (TID) | RESPIRATORY_TRACT | Status: DC
  Filled 2019-02-16: qty 3

## 2019-02-16 MED ORDER — FERROUS SULFATE 325 (65 FE) MG PO TABS
325.0000 mg | ORAL_TABLET | Freq: Every day | ORAL | Status: DC
  Administered 2019-02-17 – 2019-02-23 (×7): 325 mg via ORAL
  Filled 2019-02-16 (×7): qty 1

## 2019-02-16 MED ORDER — OXYCODONE-ACETAMINOPHEN 5-325 MG PO TABS
1.0000 | ORAL_TABLET | Freq: Once | ORAL | Status: AC
  Administered 2019-02-16: 1 via ORAL
  Filled 2019-02-16: qty 1

## 2019-02-16 MED ORDER — GUAIFENESIN-DM 100-10 MG/5ML PO SYRP: 10.0000 mL | ORAL_SOLUTION | ORAL | Status: DC | PRN

## 2019-02-16 MED ORDER — LINEZOLID 600 MG/300ML IV SOLN
600.0000 mg | Freq: Two times a day (BID) | INTRAVENOUS | Status: DC
  Administered 2019-02-16 – 2019-02-17 (×4): 600 mg via INTRAVENOUS
  Filled 2019-02-16 (×5): qty 300

## 2019-02-16 MED ORDER — ACETAMINOPHEN 325 MG PO TABS
650.0000 mg | ORAL_TABLET | Freq: Four times a day (QID) | ORAL | Status: DC | PRN
  Administered 2019-02-16 – 2019-02-20 (×9): 650 mg via ORAL
  Filled 2019-02-16 (×8): qty 2

## 2019-02-16 MED ORDER — ORAL CARE MOUTH RINSE
15.0000 mL | Freq: Two times a day (BID) | OROMUCOSAL | Status: DC
  Administered 2019-02-17 – 2019-02-23 (×13): 15 mL via OROMUCOSAL

## 2019-02-16 MED ORDER — ALLOPURINOL 100 MG PO TABS
100.0000 mg | ORAL_TABLET | Freq: Two times a day (BID) | ORAL | Status: DC
  Administered 2019-02-16 – 2019-02-23 (×14): 100 mg via ORAL
  Filled 2019-02-16 (×14): qty 1

## 2019-02-16 MED ORDER — ALBUTEROL SULFATE (2.5 MG/3ML) 0.083% IN NEBU: 3.0000 mL | INHALATION_SOLUTION | RESPIRATORY_TRACT | Status: DC | PRN

## 2019-02-16 MED ORDER — IPRATROPIUM-ALBUTEROL 0.5-2.5 (3) MG/3ML IN SOLN: 3.0000 mL | Freq: Four times a day (QID) | RESPIRATORY_TRACT | Status: DC

## 2019-02-16 MED ORDER — IPRATROPIUM-ALBUTEROL 20-100 MCG/ACT IN AERS
1.0000 | INHALATION_SPRAY | Freq: Four times a day (QID) | RESPIRATORY_TRACT | Status: DC
  Filled 2019-02-16: qty 4

## 2019-02-16 MED ORDER — HYDROCOD POLST-CPM POLST ER 10-8 MG/5ML PO SUER: 5.0000 mL | Freq: Two times a day (BID) | ORAL | Status: DC | PRN

## 2019-02-16 MED ORDER — ENOXAPARIN SODIUM 40 MG/0.4ML ~~LOC~~ SOLN
40.0000 mg | Freq: Every day | SUBCUTANEOUS | Status: DC
  Administered 2019-02-16 – 2019-02-17 (×2): 40 mg via SUBCUTANEOUS
  Filled 2019-02-16 (×2): qty 0.4

## 2019-02-16 MED ORDER — SODIUM CHLORIDE 0.9 % IV SOLN
INTRAVENOUS | Status: DC | PRN
  Administered 2019-02-16 – 2019-02-23 (×3): 250 mL via INTRAVENOUS

## 2019-02-16 MED ORDER — PANTOPRAZOLE SODIUM 40 MG PO TBEC
40.0000 mg | DELAYED_RELEASE_TABLET | Freq: Every day | ORAL | Status: DC
  Administered 2019-02-16 – 2019-02-22 (×7): 40 mg via ORAL
  Filled 2019-02-16 (×7): qty 1

## 2019-02-16 MED ORDER — IOHEXOL 350 MG/ML SOLN
75.0000 mL | Freq: Once | INTRAVENOUS | Status: AC | PRN
  Administered 2019-02-16: 75 mL via INTRAVENOUS

## 2019-02-16 MED ORDER — DEXAMETHASONE SODIUM PHOSPHATE 10 MG/ML IJ SOLN
6.0000 mg | INTRAMUSCULAR | Status: DC
  Administered 2019-02-16 – 2019-02-17 (×2): 6 mg via INTRAVENOUS
  Filled 2019-02-16: qty 1
  Filled 2019-02-16: qty 0.6

## 2019-02-16 NOTE — ED Notes (Signed)
Incentive spirometer given to patient Patient demonstrate proper use. Encouraged to use about 10 times/hour

## 2019-02-16 NOTE — Progress Notes (Signed)
PROGRESS NOTE    Kurt [name]  FYT:[id number] DOB: 01-20-1951 DOA: 02/15/2019 PCP: Leonard Downing, MD   Brief Narrative: 69 year old with past medical history significant for chronic diastolic heart failure, hypertension, CKD stage III, recent Covid pneumonia, diagnosed during Thanksgiving.  Since Covid diagnosis patient has been admitted twice, treated for pneumonia.  His oxygen requirement has increased to 6 L of oxygen from 2 L on discharge.  He reports shortness of breath on exertion, increased oxygen requirement on exertion. Note patient was admitted from 12/6 1-12/16 for acute hypoxic respiratory failure secondary to COVID-19 pneumonia.  He was treated with remdesivir and steroids.  Readmitted 12/28 on ~1/2 for worsening dyspnea and hypoxemia treated for pneumonia at that time with Augmentin for 3 weeks. Presented with worsening shortness of breath, fever, CT angio negative for PE but showed bilateral groundglass opacity.  Assessment & Plan:   Principal Problem:   Acute respiratory failure with hypoxia (HCC) Active Problems:   Hypertension   CHF (congestive heart failure) (HCC)   Multifocal pneumonia   Diarrhea  1-Acute hypoxic Respiratory Failure; secondary to pneumonia -Ct angio; worsening bilateral ground glass opacities most compatible with PNA.18 mm nodule right middle lobe. Negative for PE>  -Sputum culture: Abundant gram-positive cocci, moderate gram-negative coccobacilli, few gram-negative rods and gram-positive rods results pending. -Continue with cefepime and linezolid. -SIRS coronavirus 2 - -ID has been consulted. -Pulmonologist  Consulted: Due to recurrent pneumonia, this patient need bronchoscopy or prolonged taper steroids. -Respiratory  panel negative. -Continue with IV dexamethasone, inhaler. -QuantiFERON-TB gold pending. -Follow blood cultures:  2-Diarrhea: GI pathogen and C. difficile order.  3-OSA/OHS undiagnosed: He will need a sleep  study  Hypertension: Systolic blood pressure normal range.  Hold Norvasc and lisinopril for now.  Chronic diastolic congestive heart failure: Does not appear volume overload BNP normal at 47.  Troponins negative Estimated body mass index is 35.08 kg/m as calculated from the following:   Height as of this encounter: 5' 10.5" (1.791 m).   Weight as of this encounter: 112.5 kg.   DVT prophylaxis: Lovenox Code Status: Full code Family Communication: Care discussed with patient Disposition Plan: Patient from home, remain in the hospital for treatment of acute on chronic hypoxic respiratory failure, recurrent pneumonia, increased oxygen requirement, treating with IV antibiotics, awaiting culture report, pulmonology consulted due to recurrent infection.  Awaiting improvement of respiratory failure, suspect discharge home in 3 days.  Consultants:   Pulmonologist  ID  Procedures:   None  Antimicrobials:  Cefepime Linezolid  Subjective: He reports shortness of breath worse on exertion, his oxygen requirement has increased from 2 L to 6 L at rest over the last couple of days.  His oxygen dropped to 40 on exertion. He has not been able to follow-up with a pulmonologist as an outpatient.  Objective: Vitals:   02/16/19 0700 02/16/19 0730 02/16/19 0800 02/16/19 0815  BP: 124/78 133/73 123/70 120/69  Pulse: 87 83 81 85  Resp: (!) 26 15 (!) 23 (!) 22  Temp:      TempSrc:      SpO2: 92% 98% 97% 97%  Weight:      Height:        Intake/Output Summary (Last 24 hours) at 02/16/2019 0844 Last data filed at 02/16/2019 0222 Gross per 24 hour  Intake 1100 ml  Output --  Net 1100 ml   Filed Weights   02/15/19 2104 02/15/19 2314  Weight: 108.9 kg 112.5 kg    Examination:  General exam:  Appears calm and comfortable  Respiratory system: No wheezing,  bilateral crackles respiratory effort normal. Cardiovascular system: S1 & S2 heard, RRR. No JVD, murmurs, rubs, gallops or clicks. No  pedal edema. Gastrointestinal system: Abdomen is nondistended, soft and nontender. No organomegaly or masses felt. Normal bowel sounds heard. Central nervous system: Alert and oriented.  Extremities: Symmetric 5 x 5 power. Skin: No rashes, lesions or ulcers    Data Reviewed: I have personally reviewed following labs and imaging studies  CBC: Recent Labs  Lab 02/15/19 2213  WBC 11.3*  NEUTROABS 9.0*  HGB 11.5*  HCT 36.5*  MCV 88.0  PLT 003   Basic Metabolic Panel: Recent Labs  Lab 02/15/19 2304  NA 140  K 4.5  CL 108  CO2 25  GLUCOSE 119*  BUN 13  CREATININE 0.93  CALCIUM 8.6*  MG 2.1   GFR: Estimated Creatinine Clearance: 93.6 mL/min (by C-G formula based on SCr of 0.93 mg/dL). Liver Function Tests: Recent Labs  Lab 02/15/19 2304  AST 25  ALT 26  ALKPHOS 67  BILITOT 0.7  PROT 6.2*  ALBUMIN 2.6*   No results for input(s): LIPASE, AMYLASE in the last 168 hours. No results for input(s): AMMONIA in the last 168 hours. Coagulation Profile: Recent Labs  Lab 02/16/19 0130  INR 1.2   Cardiac Enzymes: No results for input(s): CKTOTAL, CKMB, CKMBINDEX, TROPONINI in the last 168 hours. BNP (last 3 results) No results for input(s): PROBNP in the last 8760 hours. HbA1C: No results for input(s): HGBA1C in the last 72 hours. CBG: No results for input(s): GLUCAP in the last 168 hours. Lipid Profile: No results for input(s): CHOL, HDL, LDLCALC, TRIG, CHOLHDL, LDLDIRECT in the last 72 hours. Thyroid Function Tests: No results for input(s): TSH, T4TOTAL, FREET4, T3FREE, THYROIDAB in the last 72 hours. Anemia Panel: Recent Labs    02/16/19 0525  FERRITIN 145   Sepsis Labs: Recent Labs  Lab 02/16/19 0038 02/16/19 0136  PROCALCITON  --  <0.10  LATICACIDVEN 0.7  --     Recent Results (from the past 240 hour(s))  Expectorated sputum assessment w rflx to resp cult     Status: None (Preliminary result)   Collection Time: 02/16/19 12:02 AM   Specimen: Sputum   Result Value Ref Range Status   Specimen Description SPUTUM  Final   Special Requests NONE  Final   Sputum evaluation   Final    THIS SPECIMEN IS ACCEPTABLE FOR SPUTUM CULTURE Performed at [facility], [place] [address], [place], [place] [address]    Report Status PENDING  Incomplete  Culture, respiratory     Status: None (Preliminary result)   Collection Time: 02/16/19 12:02 AM   Specimen: SPU  Result Value Ref Range Status   Specimen Description SPUTUM  Final   Special Requests NONE Reflexed from [id number]  Final   Gram Stain   Final    MODERATE WBC PRESENT, PREDOMINANTLY PMN FEW SQUAMOUS EPITHELIAL CELLS PRESENT ABUNDANT GRAM POSITIVE COCCI MODERATE GRAM NEGATIVE COCCOBACILLI FEW GRAM NEGATIVE RODS FEW GRAM POSITIVE RODS Performed at [facility], [place] [address] [place], [place] [address]    Culture PENDING  Incomplete   Report Status PENDING  Incomplete  Respiratory Panel by PCR     Status: None   Collection Time: 02/16/19  4:05 AM   Specimen: Flu Kit Nasopharyngeal Swab; Respiratory  Result Value Ref Range Status   Adenovirus NOT DETECTED NOT DETECTED Final   Coronavirus 229E NOT DETECTED  NOT DETECTED Final    Comment: (NOTE) The Coronavirus on the Respiratory Panel, DOES NOT test for the novel  Coronavirus (2019 nCoV)    Coronavirus HKU1 NOT DETECTED NOT DETECTED Final   Coronavirus NL63 NOT DETECTED NOT DETECTED Final   Coronavirus OC43 NOT DETECTED NOT DETECTED Final   Metapneumovirus NOT DETECTED NOT DETECTED Final   Rhinovirus / Enterovirus NOT DETECTED NOT DETECTED Final   Influenza A NOT DETECTED NOT DETECTED Final   Influenza B NOT DETECTED NOT DETECTED Final   Parainfluenza Virus 1 NOT DETECTED NOT DETECTED Final   Parainfluenza Virus 2 NOT DETECTED NOT DETECTED Final   Parainfluenza Virus 3 NOT DETECTED NOT DETECTED Final   Parainfluenza Virus 4 NOT DETECTED NOT DETECTED Final   Respiratory Syncytial Virus NOT DETECTED NOT DETECTED Final    Bordetella pertussis NOT DETECTED NOT DETECTED Final   Chlamydophila pneumoniae NOT DETECTED NOT DETECTED Final   Mycoplasma pneumoniae NOT DETECTED NOT DETECTED Final    Comment: Performed at [facility], [place] [address], [place], [place] [address]  MRSA PCR Screening     Status: None   Collection Time: 02/16/19  5:21 AM   Specimen: Nasal Mucosa; Nasopharyngeal  Result Value Ref Range Status   MRSA by PCR NEGATIVE NEGATIVE Final    Comment:        The GeneXpert MRSA Assay (FDA approved for NASAL specimens only), is one component of a comprehensive MRSA colonization surveillance program. It is not intended to diagnose MRSA infection nor to guide or monitor treatment for MRSA infections. Performed at [facility], [place] [address] [place], [name] [address]          Radiology Studies: DG Chest 2 View  Result Date: 02/15/2019 CLINICAL DATA:  Shortness of breath EXAM: CHEST - 2 VIEW COMPARISON:  01/18/2019 FINDINGS: Cardiomegaly. Diffuse interstitial prominence and patchy bilateral airspace disease again noted, similar to prior study. Heart is mildly enlarged. No effusions or acute bony abnormality. IMPRESSION: Diffuse interstitial prominence and patchy bilateral airspace disease, similar to prior study. Findings concerning for multifocal pneumonia. Electronically Signed   By: Rolm Baptise M.D.   On: 02/15/2019 21:32   CT Angio Chest PE W and/or Wo Contrast  Result Date: 02/16/2019 CLINICAL DATA:  Shortness of breath EXAM: CT ANGIOGRAPHY CHEST WITH CONTRAST TECHNIQUE: Multidetector CT imaging of the chest was performed using the standard protocol during bolus administration of intravenous contrast. Multiplanar CT image reconstructions and MIPs were obtained to evaluate the vascular anatomy. CONTRAST:  34m OMNIPAQUE IOHEXOL 350 MG/ML SOLN COMPARISON:  01/19/2019 FINDINGS: Cardiovascular: No filling defects in the pulmonary arteries to suggest pulmonary emboli. Mild  cardiomegaly. Aorta normal caliber. Mediastinum/Nodes: Mildly prominent right paratracheal lymph nodes with index node measuring 14 mm, stable since prior study. No axillary or hilar adenopathy. Lungs/Pleura: Extensive ground-glass airspace opacities throughout both lungs, worsening since prior study. No effusions. 18 mm nodule in the right middle lobe. This was present on prior study, partially cavitary and measuring 21 mm at that time. Upper Abdomen: Imaging into the upper abdomen shows no acute findings. Musculoskeletal: Chest wall soft tissues are unremarkable. No acute bony abnormality. Review of the MIP images confirms the above findings. IMPRESSION: Worsening extensive bilateral ground-glass airspace opacities most compatible with pneumonia. 18 mm nodule in the right middle lobe. This nodule was 21 mm on prior study and partially cavitary. Mildly enlarged right paratracheal lymph nodes, stable. No evidence of pulmonary embolus. Cardiomegaly. Electronically Signed   By: KLennette Bihari  Dover M.D.   On: 02/16/2019 02:12        Scheduled Meds: . dexamethasone (DECADRON) injection  6 mg Intravenous Q24H  . enoxaparin (LOVENOX) injection  40 mg Subcutaneous Q0600   Continuous Infusions: . ceFEPime (MAXIPIME) IV    . linezolid (ZYVOX) IV Stopped (02/16/19 0132)     LOS: 0 days    Time spent: 35 minutes.     Elmarie Shiley, MD [facility]   If 7PM-7AM, please contact night-coverage  02/16/2019, 8:44 AM

## 2019-02-16 NOTE — ED Notes (Signed)
All labs collected, labeled with 2 pt identifiers, and sent to lab Resp panel collected, labeled with 2 pt identifiers, and brought to lab

## 2019-02-16 NOTE — ED Notes (Signed)
Admitting MD at bedside.

## 2019-02-16 NOTE — ED Notes (Signed)
Care endorsed to Kehinde, RN 

## 2019-02-16 NOTE — ED Notes (Signed)
Patient back from CT.

## 2019-02-16 NOTE — ED Notes (Signed)
Breakfast Ordered 

## 2019-02-16 NOTE — ED Notes (Signed)
IV team at bedside 

## 2019-02-16 NOTE — Consult Note (Addendum)
NAME:  Kurt GlasgowFloyd N Verdi, MRN:  [id number], DOB:  05/25/1950, LOS: 0 ADMISSION DATE:  02/15/2019, CONSULTATION DATE:  02/16/2019 REFERRING MD:  Nada Libmanigalado, CHIEF COMPLAINT: Recurrent  Pneumonia   Brief History   Kurt [name] is a 69 y.o. male with medical history significant of chronic diastolic congestive heart failure, hypertension, CKD stage III, recent COVID pneumonia (12/6-12/16/ 2020) and recurrent pneumonia requiring admission 12/28-01/23/2019, presenting again to the ED on 02/15/2019  for evaluation of shortness of breath. He has had CXR in ED which was + for MF pneumonia. CT angiogram chest negative for PE. Showed  worsening extensive bilateral groundglass airspace opacities concerning for multifocal pneumonia.  18 mm nodule in the right middle lobe, nodule was 21 mm on prior study and partially cavitary.The pulmonary service  has been asked to consult for recurrent pneumonia post COVID.  History of present illness   Kurt GlasgowFloyd N Lamons is a 69 y.o. male never smoker with medical history significant of chronic diastolic congestive heart failure, hypertension, CKD stage III, recent COVID pneumonia presenting to the ED on 1/26  for evaluation of shortness of breath and increasing oxygen needs.  Patient states he was diagnosed with COVID-19 initially around Thanksgiving time last year.  Since then he has been admitted twice.  He was discharged home on 2 L supplemental oxygen but continues to feel very dyspneic even with minimal ambulation.  His oxygen requirement has gone up to 6 L.  Now even on 6 L supplemental oxygen, his oxygen saturation drops to the 40s with minimal ambulation at home.  He is feeling very weak and fell a few days ago.  It took him 45 minutes to get up from the floor.  No loss of consciousness or head injury.  His left hip was slightly sore at the time he fell but the pain has now resolved and he is able to walk without any difficulty.  His temperature has been in the 98 to 22F range at home.   States he has never had lung problems previously and is a never smoker.  He has been taking steroids for several weeks now with no improvement in his breathing.  Denies hemoptysis.  He also recently finished a 3-week course of Augmentin.  Reports having green-colored diarrhea and intermittent abdominal cramps for the past 1 week.  Denies nausea or vomiting.  Denies melena or hematochezia.  He is not on any blood thinners and does not use any over-the-counter NSAIDs.  Patient was admitted to the hospital from 12/6-12/16 for acute hypoxic respiratory failure secondary to COVID-19 pneumonia.  He was treated with steroids and remdesivir.  Discharged home on 2 L supplemental oxygen.  Admitted again on 12/28-1/2 for worsening dyspnea and hypoxia.  CT angiogram done during this hospitalization negative for PE but did show 2 cavitary nodules in both upper lobes measuring up to 2.5 cm concerning for bacterial or fungal pneumonia, septic pulmonary emboli thought to be less likely.  His presentation was thought to be due to respiratory sequela from Covid and chronic sinusitis infection.  Treated with Augmentin x 3 weeks for chronic sinusitis and a slow steroid taper for sequela of Covid pneumonia.  ED Course: Febrile with T-max 100.5 F.  Slightly tachycardic.  Tachypneic with respiratory rate up to 30s.  Not hypotensive.  Oxygen saturation in the low 90s on 6 L supplemental oxygen.  WBC count 11.3.  Lactic acid level normal.  Procalcitonin level <0.10.  High-sensitivity troponin x2 negative.  BNP pending.  MRSA PCR screen pending.  Sputum Gram stain with abundant gram-positive cocci, moderate gram-negative cocci bacilli, and few gram-negative rods.  Sputum culture pending.  UA and urine culture pending.  Blood culture x 2 pending, C diff pending. CRP 11.9, D-dimer 1.10, LDH 248, Flu A& B negative, WBC is 11.3, Fibrinogen 671, Ferritin 131, QuantiFERON TB is pending Chest x-ray personally reviewed showing diffuse  interstitial prominence and patchy bilateral airspace disease similar to prior study, findings concerning for multifocal pneumonia. CT angiogram chest negative for PE.  Showing worsening extensive bilateral groundglass airspace opacities concerning for multifocal pneumonia.  18 mm nodule in the right middle lobe, nodule was 21 mm on prior study and partially cavitary.  Mildly enlarged right paratracheal lymph nodes, stable. ED provider discussed the case with infectious disease, will consult in a.m. Recommended treating with cefepime and linezolid for HCAP. Patient received Tylenol, Percocet, cefepime, linezolid, and a 500 cc normal saline bolus.    Past Medical History    Past Medical History:  Diagnosis Date  . Arthritis   . CHF (congestive heart failure) (HCC)    diastolic dysfunction  . Chronic back pain   . COVID-19   . Depression   . Diverticulitis   . GI bleed 05/2018  . Hypertension   . PTSD (post-traumatic stress disorder)    after  a Fall   . Renal insufficiency    Decrease in GFR- PCP removed all NSAIDS & aspirin     Significant Hospital Events   November 2020>>COVID +  Admission 12/6-12/16/ 2020  Admission 12/28-01/23/2019 Admission 02/16/2019>>  Consults:  02/16/2019>> Pulmonary 02/16/2019>> ID  Procedures:    Significant Diagnostic Tests:  02/16/2019>> CTA Worsening extensive bilateral ground-glass airspace opacities most compatible with pneumonia. 18 mm nodule in the right middle lobe. This nodule was 21 mm on prior study and partially cavitary. Mildly enlarged right paratracheal lymph nodes, stable. No evidence of pulmonary embolus. Cardiomegaly.  02/15/2019 CXR Diffuse interstitial prominence and patchy bilateral airspace disease, similar to prior study. Findings concerning for multifocal pneumonia  Micro Data:  02/16/2019 Sputum: GS: abundant gram-positive cocci, moderate gram-negative cocci bacilli, and few gram-negative rods.  Culture:>> 1/26  COVID>> Negative 1/26 Respiratory Viral Panel >> Negative  Antimicrobials:  Maxipime 02/15/2019>>> Linezolid 02/15/2019>>   Interim history/subjective:  Appears comfortable on 4 L [place]. Sats are 99%,  RR is 16. He is able to speak in full sentences.  States he has minimal secretions, minimal cough Increasing oxygen demand with exertion  No sick contacts States he has also  had progressive weakness  Objective   Blood pressure 120/69, pulse 85, temperature (!) 100.5 F (38.1 C), temperature source Oral, resp. rate (!) 22, height 5' 10.5" (1.791 m), weight 112.5 kg, SpO2 97 %.        Intake/Output Summary (Last 24 hours) at 02/16/2019 0920 Last data filed at 02/16/2019 0222 Gross per 24 hour  Intake 1100 ml  Output --  Net 1100 ml   Filed Weights   02/15/19 2104 02/15/19 2314  Weight: 108.9 kg 112.5 kg    Examination: General: Awake and alert on 4 L [place], NAD HENT: NCAT, No LAD, MM dry Lungs: Bilateral chest excursion, No wheeze, few rales per bases, diminished per bases.  Cardiovascular: S1, S2, RRR, No RMG Abdomen: Soft, ND, NT,  BS + Extremities: No obvious deformities, no edema, brisk refill Neuro: Alert and oriented x 3, MAE x 4, appropriate Skin: Warm , dry and intact, No rash or lesions  Resolved Hospital  Problem list     Assessment & Plan:  Acute on subacute hypoxic respiratory failure 2/2 recurrent  Multifocal Pneumonia  Post COVID viral pneumonia 11/2018-12/2018 Repeat SARS COVID testing negative Flu A&B Negative Elevated Inflammatory Markers Plan Titrate Oxygen to maintain saturations > 94% Follow BC, sputum Cx, and other micro/ QuantiFERON Gold Linezolid and cefepime  per primary team and ID Re-culture as is clinically indicated Continue Decadron per primary team >> Consider adding PPI Continue Airborne Precautions Continue Albuterol nebs prn Continue Combivent>> Low thresh hold to convert to neb based therapy  Consider Bronch for airway inspection/  cultures/ tissue sampling Consider swallow evaluation to RO aspiration Aggressive Pulmonary Toilet OOB to chair PT/OT IS Continue Mucinex/ Robitussin as mucolytic  Trend inflammatory markers Trend WBC and fever curve  Elevated RVSP on echo 05/2018 ( 47 mm Hg) Concern for OSA/ OHS >> witnessed apnea previous admission Needs eval for [facility] CHF Plan Repeat Echo  Consider RHC Will need outpatient sleep study to evaluate for OSA/OHS    Best practice:  Diet: Heart Healthy Pain/Anxiety/Delirium protocol (if indicated): NA VAP protocol (if indicated): NA DVT prophylaxis: Lovenox GI prophylaxis: Per primary team Glucose control: SSI/ CBG per primary team Mobility:Ambulate in room  Code Status: Full Family Communication: Patient updates and is communicating with family per phone Disposition: Progressive unit  Labs   CBC: Recent Labs  Lab 02/15/19 2213  WBC 11.3*  NEUTROABS 9.0*  HGB 11.5*  HCT 36.5*  MCV 88.0  PLT 756    Basic Metabolic Panel: Recent Labs  Lab 02/15/19 2304  NA 140  K 4.5  CL 108  CO2 25  GLUCOSE 119*  BUN 13  CREATININE 0.93  CALCIUM 8.6*  MG 2.1   GFR: Estimated Creatinine Clearance: 93.6 mL/min (by C-G formula based on SCr of 0.93 mg/dL). Recent Labs  Lab 02/15/19 2213 02/16/19 0038 02/16/19 0136  PROCALCITON  --   --  <0.10  WBC 11.3*  --   --   LATICACIDVEN  --  0.7  --     Liver Function Tests: Recent Labs  Lab 02/15/19 2304  AST 25  ALT 26  ALKPHOS 67  BILITOT 0.7  PROT 6.2*  ALBUMIN 2.6*   No results for input(s): LIPASE, AMYLASE in the last 168 hours. No results for input(s): AMMONIA in the last 168 hours.  ABG    Component Value Date/Time   TCO2 29 07/08/2012 1416     Coagulation Profile: Recent Labs  Lab 02/16/19 0130  INR 1.2    Cardiac Enzymes: No results for input(s): CKTOTAL, CKMB, CKMBINDEX, TROPONINI in the last 168 hours.  HbA1C: Hgb A1c MFr Bld  Date/Time Value Ref Range Status  06/15/2018  09:00 AM 5.9 (H) 4.8 - 5.6 % Final    Comment:    (NOTE)         Prediabetes: 5.7 - 6.4         Diabetes: >6.4         Glycemic control for adults with diabetes: <7.0   07/09/2012 05:40 AM 5.5 <5.7 % Final    Comment:    (NOTE)  According to the ADA Clinical Practice Recommendations for 2011, when HbA1c is used as a screening test:  >=6.5%   Diagnostic of Diabetes Mellitus           (if abnormal result is confirmed) 5.7-6.4%   Increased risk of developing Diabetes Mellitus References:Diagnosis and Classification of Diabetes Mellitus,Diabetes Care,2011,34(Suppl 1):S62-S69 and Standards of Medical Care in         Diabetes - 2011,Diabetes Care,2011,34 (Suppl 1):S11-S61.    CBG: No results for input(s): GLUCAP in the last 168 hours.  Review of Systems:    Gen: Denies fever, chills, weight change, +fatigue, No night sweats HEENT: Denies blurred vision, double vision, hearing loss, tinnitus, sinus congestion, rhinorrhea, sore throat, neck stiffness, dysphagia PULM: + progressive shortness of breath, No cough, minimal sputum production, hemoptysis, + wheezing with exertion CV: Denies chest pain, edema, orthopnea, paroxysmal nocturnal dyspnea, palpitations GI: Denies abdominal pain, nausea, vomiting, + diarrhea, No hematochezia, melena, constipation, change in bowel habits GU: Denies dysuria, hematuria, polyuria, oliguria, urethral discharge Endocrine: Denies hot or cold intolerance, polyuria, polyphagia or appetite change Derm: Denies rash, dry skin, scaling or peeling skin change Heme: Denies easy bruising, bleeding, bleeding gums Neuro: Denies headache, numbness, weakness, slurred speech, loss of memory or consciousness  Past Medical History  He,  has a past medical history of Arthritis, CHF (congestive heart failure) (HCC), Chronic back pain, COVID-19, Depression, Diverticulitis, GI bleed (05/2018), Hypertension,  PTSD (post-traumatic stress disorder), and Renal insufficiency.   Surgical History    Past Surgical History:  Procedure Laterality Date  . BIOPSY  06/16/2018   Procedure: BIOPSY;  Surgeon: Hilarie Fredrickson, MD;  Location: [facility] ENDOSCOPY;  Service: Endoscopy;;  . ELBOW FRACTURE SURGERY     ORIF, Shoulder manipulation    . ESOPHAGOGASTRODUODENOSCOPY N/A 04/30/2014   Procedure: ESOPHAGOGASTRODUODENOSCOPY (EGD);  Surgeon: Jeani Hawking, MD;  Location: [facility] ENDOSCOPY;  Service: Endoscopy;  Laterality: N/A;  . ESOPHAGOGASTRODUODENOSCOPY (EGD) WITH PROPOFOL N/A 06/16/2018   Procedure: ESOPHAGOGASTRODUODENOSCOPY (EGD) WITH PROPOFOL;  Surgeon: Hilarie Fredrickson, MD;  Location: [facility] ENDOSCOPY;  Service: Endoscopy;  Laterality: N/A;  . HEMOSTASIS CLIP PLACEMENT  06/16/2018   Procedure: HEMOSTASIS CLIP PLACEMENT;  Surgeon: Hilarie Fredrickson, MD;  Location: [facility] ENDOSCOPY;  Service: Endoscopy;;  . LUMBAR LAMINECTOMY/DECOMPRESSION MICRODISCECTOMY N/A 02/16/2015   Procedure: Thoracic ten - thoracic twelve laminectomy;  Surgeon: Julio Sicks, MD;  Location: [facility] NEURO ORS;  Service: Neurosurgery;  Laterality: N/A;  . LUMBAR LAMINECTOMY/DECOMPRESSION MICRODISCECTOMY N/A 03/22/2016   Procedure: Lumbar One-Two, Lumbar Two-Three, Lumbar Three-Four Laminectomy and Foraminotomy;  Surgeon: Julio Sicks, MD;  Location: [facility] OR;  Service: Neurosurgery;  Laterality: N/A;  . NASAL SEPTUM SURGERY    . SCLEROTHERAPY  06/16/2018   Procedure: SCLEROTHERAPY;  Surgeon: Hilarie Fredrickson, MD;  Location: [facility] ENDOSCOPY;  Service: Endoscopy;;     Social History   reports that he has never smoked. He has never used smokeless tobacco. He reports previous alcohol use. He reports that he does not use drugs.   Family History   His family history is not on file.   Allergies No Known Allergies   Home Medications  Prior to Admission medications   Medication Sig Start Date End Date Taking? Authorizing Provider  acetaminophen (TYLENOL) 500 MG tablet Take 1,000 mg by  mouth every 4 (four) hours as needed for headache (pain).    [provider]  Acetaminophen-Codeine 300-30 MG tablet Take 1-2 tablets by mouth every 4 (four) hours as needed for pain (max 8 tabs/24 hours).  10/26/18   [provider]  albuterol (VENTOLIN HFA) 108 (90 Base) MCG/ACT inhaler Inhale 2 puffs into the lungs every 4 (four) hours as needed for wheezing or shortness of breath.  12/27/18   [provider]  allopurinol (ZYLOPRIM) 100 MG tablet Take 100 mg by mouth 2 (two) times a day. 05/16/18   [provider]  amLODipine (NORVASC) 10 MG tablet Take 1 tablet (10 mg total) by mouth every morning. 01/23/19   Drema Dallas, MD  amoxicillin-clavulanate (AUGMENTIN) 875-125 MG tablet Take 1 tablet by mouth every 12 (twelve) hours. 01/23/19   Drema Dallas, MD  benzonatate (TESSALON PERLES) 100 MG capsule Take 1 capsule (100 mg total) by mouth 3 (three) times daily as needed for cough. 01/04/19 01/04/20  Osvaldo Shipper, MD  chlorpheniramine-HYDROcodone (TUSSIONEX) 10-8 MG/5ML SUER Take 5 mLs by mouth every 12 (twelve) hours as needed for cough. 01/23/19   Drema Dallas, MD  ferrous sulfate 324 MG TBEC Take 324 mg by mouth daily with breakfast.    [provider]  fluticasone (FLONASE) 50 MCG/ACT nasal spray Place 2 sprays into both nostrils daily. 01/23/19   Drema Dallas, MD  lisinopril (ZESTRIL) 20 MG tablet Take 1 tablet (20 mg total) by mouth daily. 01/23/19   Drema Dallas, MD  oxymetazoline (AFRIN) 0.05 % nasal spray Place 1 spray into both nostrils 2 (two) times daily. 01/23/19   Drema Dallas, MD  pantoprazole (PROTONIX) 40 MG tablet Take 1 tablet (40 mg total) by mouth 2 (two) times daily for 30 days. 06/18/18 03/22/19  [place], Alvira Philips, DO  polyethylene glycol (MIRALAX / GLYCOLAX) 17 g packet Take 17 g by mouth daily as needed for mild constipation. 01/23/19   Drema Dallas, MD  predniSONE (DELTASONE) 10 MG tablet Take 2.5 tablets (25 mg total) by mouth  daily with breakfast. After discharge.  Day 1-7 01/23/19   Drema Dallas, MD  predniSONE (DELTASONE) 10 MG tablet Take 1 tablet (10 mg total) by mouth daily with breakfast. After discharge day 8 through 14 01/23/19   Drema Dallas, MD  predniSONE (DELTASONE) 10 MG tablet Take 0.5 tablets (5 mg total) by mouth daily with breakfast. After discharge day 15 until completion 01/23/19   Drema Dallas, MD  sertraline (ZOLOFT) 100 MG tablet Take 1 tablet (100 mg total) by mouth daily. Patient taking differently: Take 200 mg by mouth every morning.  07/11/12   Drema Dallas, MD     Critical care time APP time : 42 minutes   Bevelyn Ngo, MSN, AGACNP-BC [facility] Pulmonary/Critical Care Medicine Pager # [phone] After 4 pm please call [phone] 02/16/2019 11:17 AM

## 2019-02-16 NOTE — ED Notes (Signed)
Patient transported to CT 

## 2019-02-16 NOTE — ED Notes (Addendum)
meds given per [facility]. Name/DOB verified with pt Pt remains resting on cart in NAD. VSS on monitors. Equal rise and fall of chest noted. Call light within reach. Hourly rounds completed.

## 2019-02-16 NOTE — ED Notes (Signed)
Lunch Tray Ordered @ 1036. 

## 2019-02-16 NOTE — H&P (Signed)
History and Physical    Kurt [name]:[id number] DOB: 01/10/1951 DOA: 02/15/2019  PCP: Kaleen Mask, MD Patient coming from: Home  Chief Complaint: Shortness of breath  HPI: Kurt [name] is a 69 y.o. male with medical history significant of chronic diastolic congestive heart failure, hypertension, CKD stage III, recent COVID pneumonia presenting to the ED for evaluation of shortness of breath.  Patient states he was diagnosed with COVID-19 initially around Thanksgiving time last year.  Since then he has been admitted twice.  He was discharged home on 2 L supplemental oxygen but continues to feel very dyspneic even with minimal ambulation.  His oxygen requirement has gone up to 6 L.  Now even on 6 L supplemental oxygen, his oxygen saturation drops to the 40s with minimal ambulation at home.  He is feeling very weak and fell a few days ago.  It took him 45 minutes to get up from the floor.  No loss of consciousness or head injury.  His left hip was slightly sore at the time he fell but the pain has now resolved and he is able to walk without any difficulty.  His temperature has been in the 98 to 25F range at home.  States he has never had lung problems previously and is a never smoker.  He has been taking steroids for several weeks now with no improvement in his breathing.  Denies hemoptysis.  He also recently finished a 3-week course of Augmentin.  Reports having green-colored diarrhea and intermittent abdominal cramps for the past 1 week.  Denies nausea or vomiting.  Denies melena or hematochezia.  He is not on any blood thinners and does not use any over-the-counter NSAIDs.  Patient was admitted to the hospital from 12/6-12/16 for acute hypoxic respiratory failure secondary to COVID-19 pneumonia.  He was treated with steroids and remdesivir.  Discharged home on 2 L supplemental oxygen.  Admitted again on 12/28-1/2 for worsening dyspnea and hypoxia.  CT angiogram done during this  hospitalization negative for PE but did show 2 cavitary nodules in both upper lobes measuring up to 2.5 cm concerning for bacterial or fungal pneumonia, septic pulmonary emboli thought to be less likely.  His presentation was thought to be due to respiratory sequela from Covid and chronic sinusitis infection.  Treated with Augmentin x 3 weeks for chronic sinusitis and a slow steroid taper for sequela of Covid pneumonia.  ED Course: Febrile with T-max 100.5 F.  Slightly tachycardic.  Tachypneic with respiratory rate up to 30s.  Not hypotensive.  Oxygen saturation in the low 90s on 6 L supplemental oxygen.  WBC count 11.3.  Lactic acid level normal.  Procalcitonin level <0.10.  High-sensitivity troponin x2 negative.  BNP pending.  MRSA PCR screen pending.  Sputum Gram stain with abundant gram-positive cocci, moderate gram-negative cocci bacilli, and few gram-negative rods.  Sputum culture pending.  UA and urine culture pending.  Blood culture x2 pending. Chest x-ray personally reviewed showing diffuse interstitial prominence and patchy bilateral airspace disease similar to prior study, findings concerning for multifocal pneumonia. CT angiogram chest negative for PE.  Showing worsening extensive bilateral groundglass airspace opacities concerning for multifocal pneumonia.  18 mm nodule in the right middle lobe, nodule was 21 mm on prior study and partially cavitary.  Mildly enlarged right paratracheal lymph nodes, stable. ED provider discussed the case with infectious disease, will consult in a.m. Recommended treating with cefepime and linezolid for HCAP. Patient received Tylenol, Percocet, cefepime, linezolid, and a  500 cc normal saline bolus.   Review of Systems:  All systems reviewed and apart from history of presenting illness, are negative.  Past Medical History:  Diagnosis Date  . Arthritis   . CHF (congestive heart failure) (HCC)    diastolic dysfunction  . Chronic back pain   . COVID-19   .  Depression   . Diverticulitis   . GI bleed 05/2018  . Hypertension   . PTSD (post-traumatic stress disorder)    after  a Fall   . Renal insufficiency    Decrease in GFR- PCP removed all NSAIDS & aspirin     Past Surgical History:  Procedure Laterality Date  . BIOPSY  06/16/2018   Procedure: BIOPSY;  Surgeon: Hilarie FredricksonPerry, John N, MD;  [facility];  Service: Endoscopy;;  . ELBOW FRACTURE SURGERY     ORIF, Shoulder manipulation    . ESOPHAGOGASTRODUODENOSCOPY N/A 04/30/2014   Procedure: ESOPHAGOGASTRODUODENOSCOPY (EGD);  Surgeon: Jeani HawkingPatrick Hung, MD;  [facility];  Service: Endoscopy;  Laterality: N/A;  . ESOPHAGOGASTRODUODENOSCOPY (EGD) WITH PROPOFOL N/A 06/16/2018   Procedure: ESOPHAGOGASTRODUODENOSCOPY (EGD) WITH PROPOFOL;  Surgeon: Hilarie FredricksonPerry, John N, MD;  [facility];  Service: Endoscopy;  Laterality: N/A;  . HEMOSTASIS CLIP PLACEMENT  06/16/2018   Procedure: HEMOSTASIS CLIP PLACEMENT;  Surgeon: Hilarie FredricksonPerry, John N, MD;  [facility];  Service: Endoscopy;;  . LUMBAR LAMINECTOMY/DECOMPRESSION MICRODISCECTOMY N/A 02/16/2015   Procedure: Thoracic ten - thoracic twelve laminectomy;  Surgeon: Julio SicksHenry Pool, MD;  Location: [facility] NEURO ORS;  Service: Neurosurgery;  Laterality: N/A;  . LUMBAR LAMINECTOMY/DECOMPRESSION MICRODISCECTOMY N/A 03/22/2016   Procedure: Lumbar One-Two, Lumbar Two-Three, Lumbar Three-Four Laminectomy and Foraminotomy;  Surgeon: Julio SicksHenry Pool, MD;  [facility];  Service: Neurosurgery;  Laterality: N/A;  . NASAL SEPTUM SURGERY    . SCLEROTHERAPY  06/16/2018   Procedure: SCLEROTHERAPY;  Surgeon: Hilarie FredricksonPerry, John N, MD;  [facility];  Service: Endoscopy;;     reports that he has never smoked. He has never used smokeless tobacco. He reports previous alcohol use. He reports that he does not use drugs.  No Known Allergies  History reviewed. No pertinent family history.  Prior to Admission medications   Medication Sig Start Date End Date Taking?  Authorizing Provider  acetaminophen (TYLENOL) 500 MG tablet Take 1,000 mg by mouth every 4 (four) hours as needed for headache (pain).    [provider]  Acetaminophen-Codeine 300-30 MG tablet Take 1-2 tablets by mouth every 4 (four) hours as needed for pain (max 8 tabs/24 hours).  10/26/18   [provider]  albuterol (VENTOLIN HFA) 108 (90 Base) MCG/ACT inhaler Inhale 2 puffs into the lungs every 4 (four) hours as needed for wheezing or shortness of breath.  12/27/18   [provider]  allopurinol (ZYLOPRIM) 100 MG tablet Take 100 mg by mouth 2 (two) times a day. 05/16/18   [provider]  amLODipine (NORVASC) 10 MG tablet Take 1 tablet (10 mg total) by mouth every morning. 01/23/19   Drema DallasWoods, Curtis J, MD  amoxicillin-clavulanate (AUGMENTIN) 875-125 MG tablet Take 1 tablet by mouth every 12 (twelve) hours. 01/23/19   Drema DallasWoods, Curtis J, MD  benzonatate (TESSALON PERLES) 100 MG capsule Take 1 capsule (100 mg total) by mouth 3 (three) times daily as needed for cough. 01/04/19 01/04/20  Osvaldo ShipperKrishnan, Gokul, MD  chlorpheniramine-HYDROcodone (TUSSIONEX) 10-8 MG/5ML SUER Take 5 mLs by mouth every 12 (twelve) hours as needed for cough. 01/23/19   Drema DallasWoods, Curtis J, MD  ferrous sulfate 324  MG TBEC Take 324 mg by mouth daily with breakfast.    [provider]  fluticasone (FLONASE) 50 MCG/ACT nasal spray Place 2 sprays into both nostrils daily. 01/23/19   Drema Dallas, MD  lisinopril (ZESTRIL) 20 MG tablet Take 1 tablet (20 mg total) by mouth daily. 01/23/19   Drema Dallas, MD  oxymetazoline (AFRIN) 0.05 % nasal spray Place 1 spray into both nostrils 2 (two) times daily. 01/23/19   Drema Dallas, MD  pantoprazole (PROTONIX) 40 MG tablet Take 1 tablet (40 mg total) by mouth 2 (two) times daily for 30 days. 06/18/18 03/22/19  [place], Alvira Philips, DO  polyethylene glycol (MIRALAX / GLYCOLAX) 17 g packet Take 17 g by mouth daily as needed for mild constipation. 01/23/19   Drema Dallas, MD   predniSONE (DELTASONE) 10 MG tablet Take 2.5 tablets (25 mg total) by mouth daily with breakfast. After discharge.  Day 1-7 01/23/19   Drema Dallas, MD  predniSONE (DELTASONE) 10 MG tablet Take 1 tablet (10 mg total) by mouth daily with breakfast. After discharge day 8 through 14 01/23/19   Drema Dallas, MD  predniSONE (DELTASONE) 10 MG tablet Take 0.5 tablets (5 mg total) by mouth daily with breakfast. After discharge day 15 until completion 01/23/19   Drema Dallas, MD  sertraline (ZOLOFT) 100 MG tablet Take 1 tablet (100 mg total) by mouth daily. Patient taking differently: Take 200 mg by mouth every morning.  07/11/12   Drema Dallas, MD    Physical Exam: Vitals:   02/16/19 0100 02/16/19 0130 02/16/19 0200 02/16/19 0300  BP: (!) 142/70 115/88  124/75  Pulse: 97 96 95 (!) 103  Resp: (!) 26 20 20  (!) 26  Temp:      TempSrc:      SpO2: 96% 98% 97% 98%  Weight:      Height:        Physical Exam  Constitutional: He is oriented to person, place, and time. He appears well-developed and well-nourished. No distress.  HENT:  Head: Normocephalic.  Eyes: Right eye exhibits no discharge. Left eye exhibits no discharge.  Cardiovascular: Normal rate, regular rhythm and intact distal pulses.  Pulmonary/Chest: He is in respiratory distress. He has no wheezes. He has rales.  Slightly tachypneic On 6 L supplemental oxygen Bibasilar rales  Abdominal: Soft. Bowel sounds are normal. He exhibits no distension. There is no abdominal tenderness. There is no guarding.  Musculoskeletal:        General: No edema.     Cervical back: Neck supple.  Neurological: He is alert and oriented to person, place, and time.  Skin: Skin is warm and dry. He is not diaphoretic.     Labs on Admission: I have personally reviewed following labs and imaging studies  CBC: Recent Labs  Lab 02/15/19 2213  WBC 11.3*  NEUTROABS 9.0*  HGB 11.5*  HCT 36.5*  MCV 88.0  PLT 382   Basic Metabolic Panel: Recent  Labs  Lab 02/15/19 2304  NA 140  K 4.5  CL 108  CO2 25  GLUCOSE 119*  BUN 13  CREATININE 0.93  CALCIUM 8.6*  MG 2.1   GFR: Estimated Creatinine Clearance: 93.6 mL/min (by C-G formula based on SCr of 0.93 mg/dL). Liver Function Tests: Recent Labs  Lab 02/15/19 2304  AST 25  ALT 26  ALKPHOS 67  BILITOT 0.7  PROT 6.2*  ALBUMIN 2.6*   No results for input(s): LIPASE, AMYLASE in the  last 168 hours. No results for input(s): AMMONIA in the last 168 hours. Coagulation Profile: Recent Labs  Lab 02/16/19 0130  INR 1.2   Cardiac Enzymes: No results for input(s): CKTOTAL, CKMB, CKMBINDEX, TROPONINI in the last 168 hours. BNP (last 3 results) No results for input(s): PROBNP in the last 8760 hours. HbA1C: No results for input(s): HGBA1C in the last 72 hours. CBG: No results for input(s): GLUCAP in the last 168 hours. Lipid Profile: No results for input(s): CHOL, HDL, LDLCALC, TRIG, CHOLHDL, LDLDIRECT in the last 72 hours. Thyroid Function Tests: No results for input(s): TSH, T4TOTAL, FREET4, T3FREE, THYROIDAB in the last 72 hours. Anemia Panel: No results for input(s): VITAMINB12, FOLATE, FERRITIN, TIBC, IRON, RETICCTPCT in the last 72 hours. Urine analysis:    Component Value Date/Time   COLORURINE YELLOW 02/16/2019 0316   APPEARANCEUR CLEAR 02/16/2019 0316   LABSPEC 1.029 02/16/2019 0316   PHURINE 6.0 02/16/2019 0316   GLUCOSEU NEGATIVE 02/16/2019 0316   HGBUR NEGATIVE 02/16/2019 0316   BILIRUBINUR NEGATIVE 02/16/2019 0316   KETONESUR NEGATIVE 02/16/2019 0316   PROTEINUR NEGATIVE 02/16/2019 0316   UROBILINOGEN 1.0 07/09/2012 0010   NITRITE NEGATIVE 02/16/2019 0316   LEUKOCYTESUR NEGATIVE 02/16/2019 0316    Radiological Exams on Admission: DG Chest 2 View  Result Date: 02/15/2019 CLINICAL DATA:  Shortness of breath EXAM: CHEST - 2 VIEW COMPARISON:  01/18/2019 FINDINGS: Cardiomegaly. Diffuse interstitial prominence and patchy bilateral airspace disease again  noted, similar to prior study. Heart is mildly enlarged. No effusions or acute bony abnormality. IMPRESSION: Diffuse interstitial prominence and patchy bilateral airspace disease, similar to prior study. Findings concerning for multifocal pneumonia. Electronically Signed   By: Charlett Nose M.D.   On: 02/15/2019 21:32   CT Angio Chest PE W and/or Wo Contrast  Result Date: 02/16/2019 CLINICAL DATA:  Shortness of breath EXAM: CT ANGIOGRAPHY CHEST WITH CONTRAST TECHNIQUE: Multidetector CT imaging of the chest was performed using the standard protocol during bolus administration of intravenous contrast. Multiplanar CT image reconstructions and MIPs were obtained to evaluate the vascular anatomy. CONTRAST:  67mL OMNIPAQUE IOHEXOL 350 MG/ML SOLN COMPARISON:  01/19/2019 FINDINGS: Cardiovascular: No filling defects in the pulmonary arteries to suggest pulmonary emboli. Mild cardiomegaly. Aorta normal caliber. Mediastinum/Nodes: Mildly prominent right paratracheal lymph nodes with index node measuring 14 mm, stable since prior study. No axillary or hilar adenopathy. Lungs/Pleura: Extensive ground-glass airspace opacities throughout both lungs, worsening since prior study. No effusions. 18 mm nodule in the right middle lobe. This was present on prior study, partially cavitary and measuring 21 mm at that time. Upper Abdomen: Imaging into the upper abdomen shows no acute findings. Musculoskeletal: Chest wall soft tissues are unremarkable. No acute bony abnormality. Review of the MIP images confirms the above findings. IMPRESSION: Worsening extensive bilateral ground-glass airspace opacities most compatible with pneumonia. 18 mm nodule in the right middle lobe. This nodule was 21 mm on prior study and partially cavitary. Mildly enlarged right paratracheal lymph nodes, stable. No evidence of pulmonary embolus. Cardiomegaly. Electronically Signed   By: Charlett Nose M.D.   On: 02/16/2019 02:12    EKG: Independently reviewed.   Sinus rhythm, no acute ischemic changes.  Assessment/Plan Principal Problem:   Acute respiratory failure with hypoxia (HCC) Active Problems:   Hypertension   CHF (congestive heart failure) (HCC)   Multifocal pneumonia   Diarrhea   Acute on subacute hypoxic respiratory failure secondary to multifocal pneumonia in the setting of recent COVID-19 viral pneumonia Febrile with T-max 100.5 F.  Slightly tachycardic.  Tachypneic with respiratory rate up to 30s. Oxygen saturation in the low 90s on 6 L supplemental oxygen.  WBC count 11.3.  Lactic acid level normal.  Procalcitonin level <0.10.  Sputum Gram stain with abundant gram-positive cocci, moderate gram-negative cocci bacilli, and few gram-negative rods. CT angiogram chest negative for PE.  Showing worsening extensive bilateral groundglass airspace opacities concerning for multifocal pneumonia.  18 mm nodule in the right middle lobe, nodule was 21 mm on prior study and partially cavitary.   -Infectious disease will consult in a.m. Recommending treating with linezolid and cefepime for HCAP. -IV Decadron 6 mg daily -Blood culture x2 pending -MRSA PCR screen pending -Sputum culture pending -QuantiFERON gold testing to rule out TB -Repeat SARS-CoV-2 PCR test -Influenza and respiratory viral panel -Continue airborne and contact precautions -Check inflammatory markers including ferritin, fibrinogen, D-dimer, CRP, LDH -Continuous pulse ox -Supplemental oxygen to keep oxygen saturation above 92%  Diarrhea Suspect related to recent prolonged antibiotic use.  Abdominal exam benign. -C. difficile PCR panel -GI pathogen panel  OSA/ OHS undiagnosed Per discharge summary, patient had witnessed apneic events during his recent hospitalization. -Will need outpatient pulmonology follow-up for sleep study  Hypertension -Stable.  Currently normotensive.  Chronic diastolic congestive heart failure Does not appear volume overloaded on exam. -BNP  pending  Pharmacy med rec pending.  DVT prophylaxis: Lovenox Code Status: Patient wishes to be full code. Family Communication: No family available at this time. Disposition Plan: Anticipate discharge after clinical improvement. Consults called: Infectious disease (Dr. Graylon Good) Admission status: It is my clinical opinion that admission to INPATIENT is reasonable and necessary in this 69 y.o. male . presenting with acute on subacute hypoxic respiratory failure secondary to multifocal pneumonia in the setting of recent COVID-19 viral pneumonia.  Currently requiring 6 L supplemental oxygen.  Very high risk of decompensation.  Given the aforementioned, the predictability of an adverse outcome is felt to be significant. I expect that the patient will require at least 2 midnights in the hospital to treat this condition.   The medical decision making on this patient was of high complexity and the patient is at high risk for clinical deterioration, therefore this is a level 3 visit.  Shela Leff MD [facility]  If 7PM-7AM, please contact night-coverage [email or web address] Password [facility]  02/16/2019, 4:13 AM

## 2019-02-16 NOTE — Telephone Encounter (Signed)
Spoke with the pt  He states currently admitted  Will call for appt when he is d/c'ed

## 2019-02-16 NOTE — ED Notes (Signed)
Assumed care of pt. Pt alert, resting on cart in NAD. Pt maintai ning O2 sats on 4L [place]. Speaking in full sentences. Placed on continuous monitors, VSS Will continue to monitor

## 2019-02-17 DIAGNOSIS — R42 Dizziness and giddiness: Secondary | ICD-10-CM

## 2019-02-17 DIAGNOSIS — Z8616 Personal history of COVID-19: Secondary | ICD-10-CM

## 2019-02-17 DIAGNOSIS — I130 Hypertensive heart and chronic kidney disease with heart failure and stage 1 through stage 4 chronic kidney disease, or unspecified chronic kidney disease: Secondary | ICD-10-CM

## 2019-02-17 DIAGNOSIS — I5030 Unspecified diastolic (congestive) heart failure: Secondary | ICD-10-CM

## 2019-02-17 DIAGNOSIS — N183 Chronic kidney disease, stage 3 unspecified: Secondary | ICD-10-CM

## 2019-02-17 DIAGNOSIS — J189 Pneumonia, unspecified organism: Secondary | ICD-10-CM

## 2019-02-17 DIAGNOSIS — J9601 Acute respiratory failure with hypoxia: Secondary | ICD-10-CM

## 2019-02-17 DIAGNOSIS — R202 Paresthesia of skin: Secondary | ICD-10-CM

## 2019-02-17 DIAGNOSIS — J984 Other disorders of lung: Secondary | ICD-10-CM

## 2019-02-17 DIAGNOSIS — J181 Lobar pneumonia, unspecified organism: Secondary | ICD-10-CM

## 2019-02-17 DIAGNOSIS — Z8701 Personal history of pneumonia (recurrent): Secondary | ICD-10-CM

## 2019-02-17 DIAGNOSIS — R197 Diarrhea, unspecified: Secondary | ICD-10-CM

## 2019-02-17 HISTORY — DX: Pneumonia, unspecified organism: J18.9

## 2019-02-17 LAB — BASIC METABOLIC PANEL
Anion gap: 8 (ref 5–15)
BUN: 14 mg/dL (ref 8–23)
CO2: 25 mmol/L (ref 22–32)
Calcium: 8.4 mg/dL — ABNORMAL LOW (ref 8.9–10.3)
Chloride: 104 mmol/L (ref 98–111)
Creatinine, Ser: 0.96 mg/dL (ref 0.61–1.24)
GFR calc Af Amer: >60 mL/min (ref >60)
GFR calc non Af Amer: >60 mL/min (ref >60)
Glucose, Bld: 111 mg/dL — ABNORMAL HIGH (ref 70–99)
Potassium: 4.2 mmol/L (ref 3.5–5.1)
Sodium: 137 mmol/L (ref 135–145)

## 2019-02-17 LAB — CBC WITH DIFFERENTIAL/PLATELET
Abs Immature Granulocytes: 0.12 10*3/uL — ABNORMAL HIGH (ref 0.00–0.07)
Basophils Absolute: 0 10*3/uL (ref 0.0–0.1)
Basophils Relative: 0 %
Eosinophils Absolute: 0.1 10*3/uL (ref 0.0–0.5)
Eosinophils Relative: 1 %
HCT: 34.2 % — ABNORMAL LOW (ref 39.0–52.0)
Hemoglobin: 10.5 g/dL — ABNORMAL LOW (ref 13.0–17.0)
Immature Granulocytes: 1 %
Lymphocytes Relative: 7 %
Lymphs Abs: 0.8 10*3/uL (ref 0.7–4.0)
MCH: 27 pg (ref 26.0–34.0)
MCHC: 30.7 g/dL (ref 30.0–36.0)
MCV: 87.9 fL (ref 80.0–100.0)
Monocytes Absolute: 0.6 10*3/uL (ref 0.1–1.0)
Monocytes Relative: 5 %
Neutro Abs: 9.4 10*3/uL — ABNORMAL HIGH (ref 1.7–7.7)
Neutrophils Relative %: 86 %
Platelets: 395 10*3/uL (ref 150–400)
RBC: 3.89 MIL/uL — ABNORMAL LOW (ref 4.22–5.81)
RDW: 14.4 % (ref 11.5–15.5)
WBC: 11.1 10*3/uL — ABNORMAL HIGH (ref 4.0–10.5)
nRBC: 0 % (ref 0.0–0.2)

## 2019-02-17 MED ORDER — AMLODIPINE BESYLATE 2.5 MG PO TABS
2.5000 mg | ORAL_TABLET | Freq: Every day | ORAL | Status: DC
  Administered 2019-02-17 – 2019-02-23 (×7): 2.5 mg via ORAL
  Filled 2019-02-17 (×7): qty 1

## 2019-02-17 MED ORDER — ENOXAPARIN SODIUM 40 MG/0.4ML ~~LOC~~ SOLN
40.0000 mg | Freq: Every day | SUBCUTANEOUS | Status: DC
  Administered 2019-02-19 – 2019-02-23 (×5): 40 mg via SUBCUTANEOUS
  Filled 2019-02-17 (×5): qty 0.4

## 2019-02-17 MED ORDER — TRAMADOL-ACETAMINOPHEN 37.5-325 MG PO TABS
1.0000 | ORAL_TABLET | Freq: Three times a day (TID) | ORAL | Status: AC | PRN
  Administered 2019-02-17: 1 via ORAL
  Filled 2019-02-17: qty 1

## 2019-02-17 MED ORDER — SERTRALINE HCL 100 MG PO TABS
200.0000 mg | ORAL_TABLET | Freq: Every morning | ORAL | Status: DC
  Administered 2019-02-17 – 2019-02-23 (×7): 200 mg via ORAL
  Filled 2019-02-17 (×7): qty 2

## 2019-02-17 NOTE — Progress Notes (Signed)
Rehab Admissions Coordinator Note:  Patient was screened by Stephania Fragmin for appropriateness for an Inpatient Acute Rehab Consult.  Pt is currently mobilizing with min guard or better and is too high level for CIR.  At this time, we are recommending f/u with a lower level of care. Stephania Fragmin 02/17/2019, 2:13 PM  I can be reached at [phone].

## 2019-02-17 NOTE — Consult Note (Signed)
[facility] for Infectious Disease    Date of Admission:  02/15/2019     Total days of antibiotics 3  Day 3 cefepime / linezolid                 Reason for Consult: cavitary pneumonia    Referring Provider: [place]al[place]  Primary Care Provider: Leonard Downing, MD     Assessment: Kurt [name] is a 69 y.o. male with his 3rd hospitalization related to recurrent pneumonia since mid-December when he was diagnosed and treated for COVID pneumonia.  He has had increasing oxygenation needs since discharge from primary COVID pneumonia with now evolved bilateral cavitary-nodular opacities noted on CT scan.   With negative MRSA PCR will stop the Linezolid. Continue cefepime for now. Sputum culture with abundant gram positive cocci but no growth yet - culture pending.   Agree he needs bronch/BAL - very low suspicion for TB but could be other non-tuberculosis mycobacteria vs fungal process. He has been on steroids with varying pulse doses since mid December. Did not receive tocilizumab. Please check AFB smear/culture and fungal culture from bronch sample.  Lung cavitation due to COVID is uncommon but there are case reports describing this condition. His procalcitonin is < 0.10 suggesting non-bacterial cause.   His diarrhea and abdominal cramping have resolved and no BM x 48h. Most likely Augmentin associated diarrhea and not infectious. No fevers or high wbc count and with resolution CDiff not likely. Cancel enteric precautions & stool studies.     Plan: 1. Stop linezolid 2. Continue cefepime 3. Check AFB smear/culture and fungal culture along with routine culture for BAL specimen 4. Stop enteric precautions 5. Cancel stool tests.     Principal Problem:   Cavitary pneumonia Active Problems:   Multifocal pneumonia   Hypertension   History of COVID-19   Acute respiratory failure with hypoxia (HCC)   CHF (congestive heart failure) (HCC)   Acute on chronic respiratory  failure (HCC)   Diarrhea   Respiratory failure, acute ([place])   . allopurinol  100 mg Oral BID  . amLODipine  2.5 mg Oral Daily  . [START ON 02/19/2019] enoxaparin (LOVENOX) injection  40 mg Subcutaneous Q0600  . ferrous sulfate  325 mg Oral Q breakfast  . mouth rinse  15 mL Mouth Rinse BID  . pantoprazole  40 mg Oral Daily  . sertraline  200 mg Oral q morning - 10a     HPI: Kurt [name] is a 69 y.o. male admitted with worsening shortness of breath.   Past medical history includes dCHF, HTN, CKD3.   Previously diagnosed with COVID-19 pneumonia late November 2020 requiring hospitalization 12/06 -12/16 where he was treated with steroids and remdesivir and d/c'd home on 2 LPM Oxygen. Readmitted 12/28-1/02 for worsening SOB and hypoxia. CTA negative for PE but revealed 2 cavitary nodules in both upper lobes concerning for bacterial or fungal pneumonia.  and increasing hypoxia now requiring 6 LPM nasal cannula.   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 Never smoker. He has never traveled outside the [place] but has traveled to nearly all the continental states on motorcycles. He has had many TB skin tests in the past and never a positive result, however uncertain as to when his last test was.  He has chronic sinusitus symptoms. Recently completed 3 weeks of Augmentin and steroids for several weeks now  without any improvement in shortness of breath and hypoxia. This caused cramping diarrhea ~5 times a day up until Monday after he used peptobismol for symptmatic care. No BMs or cramping since. He has had some sweating episodes and a dry cough but generally his main concern is the hypoxia with very little effort and associated symptoms of shortness of breath, finger/mouth tingling, dizziness and lightheadedness. He has not noted any improvement since he has been on antibiotics presently.   In the ER he was found to be febrile, tachycardic  and tachypnic (30s). Oxygenation low 90s on 6 LPM. WBC 11.3K, normal procalcitonin level. Sputum with abundant gram positive cocci, moderate gram negative coccobacilli and few gram negative rods with  He was started on linezolid and cefepime and has been afebrile.   MRSA PCR negative 02/16/19 COVID PCR negative (positive previously day before Thanksgiving) RVP negative Blood cultures 1/26 negative to date    Review of Systems: Review of Systems  Constitutional: Positive for diaphoresis. Negative for chills, fever, malaise/fatigue and weight loss.  HENT: Negative for sinus pain and sore throat.   Respiratory: Positive for cough and shortness of breath. Negative for sputum production and wheezing.   Cardiovascular: Negative for chest pain and leg swelling.  Gastrointestinal: Positive for diarrhea (resolved prior to admission ). Negative for abdominal pain, nausea and vomiting.  Genitourinary: Negative for dysuria.  Musculoskeletal: Negative for back pain and joint pain.  Skin: Negative for rash.  Neurological: Positive for dizziness and tingling. Negative for weakness and headaches.    Past Medical History:  Diagnosis Date  . Arthritis   . CHF (congestive heart failure) (HCC)    diastolic dysfunction  . Chronic back pain   . COVID-19   . Depression   . Diverticulitis   . GI bleed 05/2018  . Hypertension   . PTSD (post-traumatic stress disorder)    after  a Fall   . Renal insufficiency    Decrease in GFR- PCP removed all NSAIDS & aspirin     Social History   Tobacco Use  . Smoking status: Never Smoker  . Smokeless tobacco: Never Used  Substance Use Topics  . Alcohol use: Not Currently    Comment: quit in 1989   . Drug use: No    History reviewed. No pertinent family history. No Known Allergies  OBJECTIVE: Blood pressure 121/65, pulse 77, temperature 98.1 F (36.7 C), temperature source Oral, resp. rate 19, height 5' 10.5" (1.791 m), weight 121.7 kg, SpO2 96  %.  Physical Exam Constitutional:      Appearance: He is well-developed.     Comments: Seated in recliner. Appears comfortable.   HENT:     Mouth/Throat:     Mouth: Mucous membranes are moist.     Pharynx: Oropharynx is clear.  Cardiovascular:     Rate and Rhythm: Normal rate and regular rhythm.  Pulmonary:     Effort: Pulmonary effort is normal.     Breath sounds: Normal breath sounds.     Comments: No conversational dyspnea noted. No episodes of coughing during encounter.  Abdominal:     General: Bowel sounds are normal.     Palpations: Abdomen is soft.  Skin:    General: Skin is warm and dry.     Capillary Refill: Capillary refill takes less than 2 seconds.  Neurological:     Mental Status: He is alert and oriented to person, place, and time.     Lab Results Lab Results  Component Value Date  WBC 11.1 (H) 02/17/2019   HGB 10.5 (L) 02/17/2019   HCT 34.2 (L) 02/17/2019   MCV 87.9 02/17/2019   PLT 395 02/17/2019    Lab Results  Component Value Date   CREATININE 0.96 02/17/2019   BUN 14 02/17/2019   NA 137 02/17/2019   K 4.2 02/17/2019   CL 104 02/17/2019   CO2 25 02/17/2019    Lab Results  Component Value Date   ALT 26 02/15/2019   AST 25 02/15/2019   ALKPHOS 67 02/15/2019   BILITOT 0.7 02/15/2019     Microbiology: Recent Results (from the past 240 hour(s))  Blood Culture (routine x 2)     Status: None (Preliminary result)   Collection Time: 02/15/19 11:04 PM   Specimen: BLOOD RIGHT HAND  Result Value Ref Range Status   Specimen Description BLOOD RIGHT HAND  Final   Special Requests   Final    BOTTLES DRAWN AEROBIC AND ANAEROBIC Blood Culture results may not be optimal due to an inadequate volume of blood received in culture bottles   Culture   Final    NO GROWTH 2 DAYS Performed at [facility], [place] [address] [place], [place] [address]    Report Status PENDING  Incomplete  Expectorated sputum assessment w rflx to resp cult     Status: None  (Preliminary result)   Collection Time: 02/16/19 12:02 AM   Specimen: Sputum  Result Value Ref Range Status   Specimen Description SPUTUM  Final   Special Requests NONE  Final   Sputum evaluation   Final    THIS SPECIMEN IS ACCEPTABLE FOR SPUTUM CULTURE Performed at [facility], [place] [address], [place], [place] [address]    Report Status PENDING  Incomplete  Culture, respiratory     Status: None (Preliminary result)   Collection Time: 02/16/19 12:02 AM   Specimen: SPU  Result Value Ref Range Status   Specimen Description SPUTUM  Final   Special Requests NONE Reflexed from [id number]  Final   Gram Stain   Final    MODERATE WBC PRESENT, PREDOMINANTLY PMN FEW SQUAMOUS EPITHELIAL CELLS PRESENT ABUNDANT GRAM POSITIVE COCCI MODERATE GRAM NEGATIVE COCCOBACILLI FEW GRAM NEGATIVE RODS FEW GRAM POSITIVE RODS    Culture   Final    CULTURE REINCUBATED FOR BETTER GROWTH Performed at [facility], [place] [address], [place], [place] [address]    Report Status PENDING  Incomplete  Blood Culture (routine x 2)     Status: None (Preliminary result)   Collection Time: 02/16/19  1:30 AM   Specimen: BLOOD LEFT ARM  Result Value Ref Range Status   Specimen Description BLOOD LEFT ARM  Final   Special Requests   Final    BOTTLES DRAWN AEROBIC AND ANAEROBIC Blood Culture adequate volume   Culture   Final    NO GROWTH 1 DAY Performed at [facility], [place] [address], [place], [place] [address]    Report Status PENDING  Incomplete  Urine culture     Status: None   Collection Time: 02/16/19  3:05 AM   Specimen: In/Out Cath Urine  Result Value Ref Range Status   Specimen Description IN/OUT CATH URINE  Final   Special Requests NONE  Final   Culture   Final    NO GROWTH Performed at [facility], [place] [address], [place],  [address]    Report Status 02/16/2019 FINAL  Final  SARS CORONAVIRUS 2 (TAT 6-24 HRS)  Nasopharyngeal Nasal Mucosa     Status: None   Collection Time:  02/16/19  3:30 AM   Specimen: Nasal Mucosa; Nasopharyngeal  Result Value Ref Range Status   SARS Coronavirus 2 NEGATIVE NEGATIVE Final    Comment: (NOTE) SARS-CoV-2 target nucleic acids are NOT DETECTED. The SARS-CoV-2 RNA is generally detectable in upper and lower respiratory specimens during the acute phase of infection. Negative results do not preclude SARS-CoV-2 infection, do not rule out co-infections with other pathogens, and should not be used as the sole basis for treatment or other patient management decisions. Negative results must be combined with clinical observations, patient history, and epidemiological information. The expected result is Negative. Fact Sheet for Patients: SugarRoll.be Fact Sheet for Healthcare Providers: https://www.woods-mathews.com/ This test is not yet approved or cleared by the [place] FDA and  has been authorized for detection and/or diagnosis of SARS-CoV-2 by FDA under an Emergency Use Authorization (EUA). This EUA will remain  in effect (meaning this test can be used) for the duration of the COVID-19 declaration under Section 56 4(b)(1) of the Act, 21 U.S.C. section 360bbb-3(b)(1), unless the authorization is terminated or revoked sooner. Performed at [facility], [place] [address], [place], [place] [address]   Respiratory Panel by PCR     Status: None   Collection Time: 02/16/19  4:05 AM   Specimen: Flu Kit Nasopharyngeal Swab; Respiratory  Result Value Ref Range Status   Adenovirus NOT DETECTED NOT DETECTED Final   Coronavirus 229E NOT DETECTED NOT DETECTED Final    Comment: (NOTE) The Coronavirus on the Respiratory Panel, DOES NOT test for the novel  Coronavirus (2019 nCoV)    Coronavirus HKU1 NOT DETECTED NOT DETECTED Final   Coronavirus NL63 NOT DETECTED NOT DETECTED Final   Coronavirus OC43 NOT DETECTED NOT DETECTED Final   Metapneumovirus NOT DETECTED NOT DETECTED Final    Rhinovirus / Enterovirus NOT DETECTED NOT DETECTED Final   Influenza A NOT DETECTED NOT DETECTED Final   Influenza B NOT DETECTED NOT DETECTED Final   Parainfluenza Virus 1 NOT DETECTED NOT DETECTED Final   Parainfluenza Virus 2 NOT DETECTED NOT DETECTED Final   Parainfluenza Virus 3 NOT DETECTED NOT DETECTED Final   Parainfluenza Virus 4 NOT DETECTED NOT DETECTED Final   Respiratory Syncytial Virus NOT DETECTED NOT DETECTED Final   Bordetella pertussis NOT DETECTED NOT DETECTED Final   Chlamydophila pneumoniae NOT DETECTED NOT DETECTED Final   Mycoplasma pneumoniae NOT DETECTED NOT DETECTED Final    Comment: Performed at [facility], [place]. [address] [place], [place] [address]  MRSA PCR Screening     Status: None   Collection Time: 02/16/19  5:21 AM   Specimen: Nasal Mucosa; Nasopharyngeal  Result Value Ref Range Status   MRSA by PCR NEGATIVE NEGATIVE Final    Comment:        The GeneXpert MRSA Assay (FDA approved for NASAL specimens only), is one component of a comprehensive MRSA colonization surveillance program. It is not intended to diagnose MRSA infection nor to guide or monitor treatment for MRSA infections. Performed at [facility], [place] [address], [place], [place] [address]     Janene Madeira, MSN, NP-C [facility] for Infectious [place] Cell: [phone] Pager: [phone]  02/17/2019 2:31 PM

## 2019-02-17 NOTE — Progress Notes (Signed)
NAME:  Kurt [name], MRN:  [id number], DOB:  02-04-1950, LOS: 1 ADMISSION DATE:  02/15/2019, CONSULTATION DATE:  02/16/2019 REFERRING MD:  Danielle Rankin, CHIEF COMPLAINT: Recurrent  Pneumonia   Brief History   Kurt [name] is a 69 y.o. male with medical history significant of chronic diastolic congestive heart failure, hypertension, CKD stage III, recent COVID pneumonia (12/6-12/16/ 2020) and recurrent pneumonia requiring admission 12/28-01/23/2019, presenting again to the ED on 02/15/2019  for evaluation of shortness of breath. He has had CXR in ED which was + for MF pneumonia. CT angiogram chest negative for PE. Showed  worsening extensive bilateral groundglass airspace opacities concerning for multifocal pneumonia.  18 mm nodule in the right middle lobe, nodule was 21 mm on prior study and partially cavitary.The pulmonary service  has been asked to consult for recurrent pneumonia post COVID.  History of present illness   Kurt [name] is a 69 y.o. male never smoker with medical history significant of chronic diastolic congestive heart failure, hypertension, CKD stage III, recent COVID pneumonia presenting to the ED on 1/26  for evaluation of shortness of breath and increasing oxygen needs.  Patient states he was diagnosed with COVID-19 initially around Thanksgiving time last year.  Since then he has been admitted twice.  He was discharged home on 2 L supplemental oxygen but continues to feel very dyspneic even with minimal ambulation.  His oxygen requirement has gone up to 6 L.  Now even on 6 L supplemental oxygen, his oxygen saturation drops to the 40s with minimal ambulation at home.  He is feeling very weak and fell a few days ago.  It took him 45 minutes to get up from the floor.  No loss of consciousness or head injury.  His left hip was slightly sore at the time he fell but the pain has now resolved and he is able to walk without any difficulty.  His temperature has been in the 98 to 53F range at home.   States he has never had lung problems previously and is a never smoker.  He has been taking steroids for several weeks now with no improvement in his breathing.  Denies hemoptysis.  He also recently finished a 3-week course of Augmentin.  Reports having green-colored diarrhea and intermittent abdominal cramps for the past 1 week.  Denies nausea or vomiting.  Denies melena or hematochezia.  He is not on any blood thinners and does not use any over-the-counter NSAIDs.  Patient was admitted to the hospital from 12/6-12/16 for acute hypoxic respiratory failure secondary to COVID-19 pneumonia.  He was treated with steroids and remdesivir.  Discharged home on 2 L supplemental oxygen.  Admitted again on 12/28-1/2 for worsening dyspnea and hypoxia.  CT angiogram done during this hospitalization negative for PE but did show 2 cavitary nodules in both upper lobes measuring up to 2.5 cm concerning for bacterial or fungal pneumonia, septic pulmonary emboli thought to be less likely.  His presentation was thought to be due to respiratory sequela from Covid and chronic sinusitis infection.  Treated with Augmentin x 3 weeks for chronic sinusitis and a slow steroid taper for sequela of Covid pneumonia.  ED Course: Febrile with T-max 100.5 F.  Slightly tachycardic.  Tachypneic with respiratory rate up to 30s.  Not hypotensive.  Oxygen saturation in the low 90s on 6 L supplemental oxygen.  WBC count 11.3.  Lactic acid level normal.  Procalcitonin level <0.10.  High-sensitivity troponin x2 negative.  BNP pending.  MRSA PCR screen pending.  Sputum Gram stain with abundant gram-positive cocci, moderate gram-negative cocci bacilli, and few gram-negative rods.  Sputum culture pending.  UA and urine culture pending.  Blood culture x 2 pending, C diff pending. CRP 11.9, D-dimer 1.10, LDH 248, Flu A& B negative, WBC is 11.3, Fibrinogen 671, Ferritin 131, QuantiFERON TB is pending Chest x-ray personally reviewed showing diffuse  interstitial prominence and patchy bilateral airspace disease similar to prior study, findings concerning for multifocal pneumonia. CT angiogram chest negative for PE.  Showing worsening extensive bilateral groundglass airspace opacities concerning for multifocal pneumonia.  18 mm nodule in the right middle lobe, nodule was 21 mm on prior study and partially cavitary.  Mildly enlarged right paratracheal lymph nodes, stable. ED provider discussed the case with infectious disease, will consult in a.m. Recommended treating with cefepime and linezolid for HCAP. Patient received Tylenol, Percocet, cefepime, linezolid, and a 500 cc normal saline bolus.    Past Medical History    Past Medical History:  Diagnosis Date  . Arthritis   . CHF (congestive heart failure) (HCC)    diastolic dysfunction  . Chronic back pain   . COVID-19   . Depression   . Diverticulitis   . GI bleed 05/2018  . Hypertension   . PTSD (post-traumatic stress disorder)    after  a Fall   . Renal insufficiency    Decrease in GFR- PCP removed all NSAIDS & aspirin     Significant Hospital Events   November 2020>>COVID +  Admission 12/6-12/16/ 2020  Admission 12/28-01/23/2019 Admission 02/16/2019>>  Consults:  02/16/2019>> Pulmonary 02/16/2019>> ID  Procedures:    Significant Diagnostic Tests:  02/16/2019>> CTA Worsening extensive bilateral ground-glass airspace opacities most compatible with pneumonia. 18 mm nodule in the right middle lobe. This nodule was 21 mm on prior study and partially cavitary. Mildly enlarged right paratracheal lymph nodes, stable. No evidence of pulmonary embolus. Cardiomegaly.  02/15/2019 CXR Diffuse interstitial prominence and patchy bilateral airspace disease, similar to prior study. Findings concerning for multifocal pneumonia  Micro Data:  02/16/2019 Sputum: GS: abundant gram-positive cocci, moderate gram-negative cocci bacilli, and few gram-negative rods.   Culture:>>pending 1/26 COVID>> Negative 1/26 Respiratory Viral Panel >> Negative  Antimicrobials:  Maxipime 02/15/2019>>> Linezolid 02/15/2019>>   Interim history/subjective:  Reports desaturations with ambulation despite oxygen therapy. Planning to sit in chair for the remainder of the day. Reports unable to sleep due to machines beeping at night due to his low oxygen levels.  Objective   Blood pressure 121/65, pulse 77, temperature 98.1 F (36.7 C), temperature source Oral, resp. rate 19, height 5' 10.5" (1.791 m), weight 121.7 kg, SpO2 96 %.        Intake/Output Summary (Last 24 hours) at 02/17/2019 1358 Last data filed at 02/17/2019 0422 Gross per 24 hour  Intake 880.49 ml  Output 650 ml  Net 230.49 ml   Filed Weights   02/15/19 2314 02/16/19 1422 02/17/19 0430  Weight: 112.5 kg 121.5 kg 121.7 kg   Physical Exam: General: Obese, well-appearing, no acute distress HENT: [place], AT, OP clear, MMM Eyes: EOMI, no scleral icterus Respiratory: Diminished breath sounds bilaterally. No crackles, wheezing or rales Cardiovascular: RRR, -M/R/G, no JVD GI: BS+, soft, nontender Extremities:-Edema,-tenderness Neuro: AAO x4, CNII-XII grossly intact Skin: Intact, no rashes or bruising Psych: Normal mood, normal affect  Resolved Hospital Problem list     Assessment & Plan:   Acute on chronic hypoxemic respiratory failure secondary to multifocal pneumonia. Likely, multifactorial. In the  acute setting, agree with treating for infection with broad-spectrum antibiotics. His hypoxemia is worsened by the fact that he likely has undiagnosed/untreated sleep apnea, post-COVID pneumonitis and pulmonary hypertension (likely secondary). I am also concerned about his evolving chest image changes which can represent acute infection but could be developing diffuse parenchymal lung process including acute interstitial pneumonia or organizing pneumonia or hypersensitivity pneumonitis. Deconditioning from his  recent hospitalizations also plays a significant role. --Plan for bronchoscopy with BAL tomorrow 02/18/19 at 4:30 PM --Will defer antibiotic selection to ID.  --Follow-up sputum culture --Continue bronchodilators --Recommend PT and OOB as tolerated --Ok to start non-invasive ventilation as an inpatient for support. But will need outpatient PFTs and sleep study to qualify for NIV.  RML cavitary lung lesion. Minimally improved after prolonged antibiotic course.  --Defer to ID for antibiotic selection --F/u quantiferon gold.    Best practice:  Diet: Heart Healthy Pain/Anxiety/Delirium protocol (if indicated): NA VAP protocol (if indicated): NA DVT prophylaxis: Lovenox GI prophylaxis: Per primary team Glucose control: SSI/ CBG per primary team Mobility:Ambulate in room  Code Status: Full Family Communication: Updated patient at bedside Disposition: Progressive unit  Labs   CBC: Recent Labs  Lab 02/15/19 2213 02/17/19 0355  WBC 11.3* 11.1*  NEUTROABS 9.0* 9.4*  HGB 11.5* 10.5*  HCT 36.5* 34.2*  MCV 88.0 87.9  PLT 382 212    Basic Metabolic Panel: Recent Labs  Lab 02/15/19 2304 02/17/19 0355  NA 140 137  K 4.5 4.2  CL 108 104  CO2 25 25  GLUCOSE 119* 111*  BUN 13 14  CREATININE 0.93 0.96  CALCIUM 8.6* 8.4*  MG 2.1  --    GFR: Estimated Creatinine Clearance: 94.4 mL/min (by C-G formula based on SCr of 0.96 mg/dL). Recent Labs  Lab 02/15/19 2213 02/16/19 0038 02/16/19 0136 02/17/19 0355  PROCALCITON  --   --  <0.10  --   WBC 11.3*  --   --  11.1*  LATICACIDVEN  --  0.7  --   --     Liver Function Tests: Recent Labs  Lab 02/15/19 2304  AST 25  ALT 26  ALKPHOS 67  BILITOT 0.7  PROT 6.2*  ALBUMIN 2.6*   No results for input(s): LIPASE, AMYLASE in the last 168 hours. No results for input(s): AMMONIA in the last 168 hours.  ABG    Component Value Date/Time   TCO2 29 07/08/2012 1416     Coagulation Profile: Recent Labs  Lab 02/16/19 0130   INR 1.2    Cardiac Enzymes: No results for input(s): CKTOTAL, CKMB, CKMBINDEX, TROPONINI in the last 168 hours.  HbA1C: Hgb A1c MFr Bld  Date/Time Value Ref Range Status  06/15/2018 09:00 AM 5.9 (H) 4.8 - 5.6 % Final    Comment:    (NOTE)         Prediabetes: 5.7 - 6.4         Diabetes: >6.4         Glycemic control for adults with diabetes: <7.0   07/09/2012 05:40 AM 5.5 <5.7 % Final    Comment:    (NOTE)  According to the ADA Clinical Practice Recommendations for 2011, when HbA1c is used as a screening test:  >=6.5%   Diagnostic of Diabetes Mellitus           (if abnormal result is confirmed) 5.7-6.4%   Increased risk of developing Diabetes Mellitus References:Diagnosis and Classification of Diabetes Mellitus,Diabetes [id number](GYKZL 1):S62-S69 and Standards of Medical Care in         Diabetes - 2011,Diabetes [id number],0177,93 (Suppl 1):S11-S61.    CBG: No results for input(s): GLUCAP in the last 168 hours.     I have spent a total time of 45-minutes coordinating care and discussing medical diagnosis and plan with the patient/family. Imaging, labs and tests included in this note have been reviewed and interpreted independently by me.  Rodman Pickle, M.D. [facility] Pulmonary/Critical Care Medicine 02/17/2019 1:58 PM

## 2019-02-17 NOTE — Plan of Care (Signed)
  Problem: Education: Goal: Knowledge of General Education information will improve Description: Including pain rating scale, medication(s)/side effects and non-pharmacologic comfort measures Outcome: Progressing   Problem: Activity: Goal: Risk for activity intolerance will decrease 02/17/2019 2130 by Eben Burow, RN Outcome: Progressing 02/17/2019 2128 by Eben Burow, RN Outcome: Progressing   Problem: Nutrition: Goal: Adequate nutrition will be maintained 02/17/2019 2130 by Eben Burow, RN Outcome: Progressing 02/17/2019 2128 by Eben Burow, RN Outcome: Progressing   Problem: Elimination: Goal: Will not experience complications related to bowel motility Outcome: Progressing   Problem: Safety: Goal: Ability to remain free from injury will improve Outcome: Progressing

## 2019-02-17 NOTE — Progress Notes (Addendum)
PROGRESS NOTE    Kurt [name]  ENI:[id number] DOB: 20-Aug-1950 DOA: 02/15/2019 PCP: Leonard Downing, MD   Brief Narrative: 69 year old with past medical history significant for chronic diastolic heart failure, hypertension, CKD stage III, recent Covid pneumonia, diagnosed during Thanksgiving.  Since Covid diagnosis patient has been admitted twice, treated for pneumonia.  His oxygen requirement has increased to 6 L of oxygen from 2 L on discharge.  He reports shortness of breath on exertion, increased oxygen requirement on exertion. Note patient was admitted from 12/6 1-12/16 for acute hypoxic respiratory failure secondary to COVID-19 pneumonia.  He was treated with remdesivir and steroids.  Readmitted 12/28 on ~1/2 for worsening dyspnea and hypoxemia treated for pneumonia at that time with Augmentin for 3 weeks. Presented with worsening shortness of breath, fever, CT angio negative for PE but showed bilateral groundglass opacity.  Assessment & Plan:   Principal Problem:   Acute respiratory failure with hypoxia (HCC) Active Problems:   Hypertension   COVID-19   CHF (congestive heart failure) (HCC)   Acute on chronic respiratory failure (HCC)   Multifocal pneumonia   Diarrhea   Respiratory failure, acute (HCC)  1-Acute hypoxic Respiratory Failure; secondary to pneumonia -Ct angio; worsening bilateral ground glass opacities most compatible with PNA.18 mm nodule right middle lobe. Negative for PE>  -Sputum culture: Abundant gram-positive cocci, moderate gram-negative coccobacilli, few gram-negative rods and gram-positive rods results pending.  Culture regrowth -Continue with cefepime and linezolid. -SARS coronavirus 2 - -ID has been consulted. -Respiratory  panel negative. -Will stop Dexamethasone as recommend by Pulmonologist  -QuantiFERON-TB gold pending. -Follow blood cultures: 12 today. -plan for Bronchoscopy 1-28 -no evidence of aspiration on swallow eval.    2-Diarrhea: GI pathogen and C. difficile order. Resolved.   3-OSA/OHS undiagnosed: He will need a sleep study Start CPAP/  Will ask CM to see if they can help with HH CPAP.   Hypertension: Systolic blood pressure normal range. Resume Norvasc and hold lisinopril.   Chronic diastolic congestive heart failure: Does not appear volume overload BNP normal at 47.  Troponins negative  CKD stage A; cr stable.   Estimated body mass index is 37.96 kg/m as calculated from the following:   Height as of this encounter: 5' 10.5" (1.791 m).   Weight as of this encounter: 121.7 kg.   DVT prophylaxis: Lovenox Code Status: Full code Family Communication: Care discussed with patient Disposition Plan: Patient from home, remain in the hospital for treatment of acute on chronic hypoxic respiratory failure, recurrent pneumonia, increased oxygen requirement, treating with IV antibiotics, awaiting culture report.  Plan for bronchoscopy on 1/28. -Needs PT eval  -Awaiting improvement of respiratory failure, suspect discharge home in 3 days.  Consultants:   Pulmonologist  ID  Procedures:   None  Antimicrobials:  Cefepime Linezolid  Subjective: He report no shortness of breath a face sitting still, acid has a third normal face oxygen drop. The machine kept beeping overnight he is not able to sleep.   Objective: Vitals:   02/17/19 0424 02/17/19 0428 02/17/19 0430 02/17/19 0822  BP: (!) 114/98 (!) 122/59  136/68  Pulse: 77     Resp: 19     Temp: 98.5 F (36.9 C)   98.1 F (36.7 C)  TempSrc: Axillary   Oral  SpO2: 96%     Weight:   121.7 kg   Height:        Intake/Output Summary (Last 24 hours) at 02/17/2019 0934 Last data filed at 02/17/2019 [phone]  Gross per 24 hour  Intake 880.49 ml  Output 650 ml  Net 230.49 ml   Filed Weights   02/15/19 2314 02/16/19 1422 02/17/19 0430  Weight: 112.5 kg 121.5 kg 121.7 kg    Examination:  General exam: NAD Respiratory system: no  wheezing, mild crackles.  Cardiovascular system: S 1 , S 2 RRR Gastrointestinal system: BS present, soft, nt Central nervous system: Alert, follows command Extremities: Symmetric power Skin: no rashes.    Data Reviewed: I have personally reviewed following labs and imaging studies  CBC: Recent Labs  Lab 02/15/19 2213 02/17/19 0355  WBC 11.3* 11.1*  NEUTROABS 9.0* 9.4*  HGB 11.5* 10.5*  HCT 36.5* 34.2*  MCV 88.0 87.9  PLT 382 353   Basic Metabolic Panel: Recent Labs  Lab 02/15/19 2304 02/17/19 0355  NA 140 137  K 4.5 4.2  CL 108 104  CO2 25 25  GLUCOSE 119* 111*  BUN 13 14  CREATININE 0.93 0.96  CALCIUM 8.6* 8.4*  MG 2.1  --    GFR: Estimated Creatinine Clearance: 94.4 mL/min (by C-G formula based on SCr of 0.96 mg/dL). Liver Function Tests: Recent Labs  Lab 02/15/19 2304  AST 25  ALT 26  ALKPHOS 67  BILITOT 0.7  PROT 6.2*  ALBUMIN 2.6*   No results for input(s): LIPASE, AMYLASE in the last 168 hours. No results for input(s): AMMONIA in the last 168 hours. Coagulation Profile: Recent Labs  Lab 02/16/19 0130  INR 1.2   Cardiac Enzymes: No results for input(s): CKTOTAL, CKMB, CKMBINDEX, TROPONINI in the last 168 hours. BNP (last 3 results) No results for input(s): PROBNP in the last 8760 hours. HbA1C: No results for input(s): HGBA1C in the last 72 hours. CBG: No results for input(s): GLUCAP in the last 168 hours. Lipid Profile: No results for input(s): CHOL, HDL, LDLCALC, TRIG, CHOLHDL, LDLDIRECT in the last 72 hours. Thyroid Function Tests: No results for input(s): TSH, T4TOTAL, FREET4, T3FREE, THYROIDAB in the last 72 hours. Anemia Panel: Recent Labs    02/16/19 0525  FERRITIN 145   Sepsis Labs: Recent Labs  Lab 02/16/19 0038 02/16/19 0136  PROCALCITON  --  <0.10  LATICACIDVEN 0.7  --     Recent Results (from the past 240 hour(s))  Blood Culture (routine x 2)     Status: None (Preliminary result)   Collection Time: 02/15/19 11:04  PM   Specimen: BLOOD RIGHT HAND  Result Value Ref Range Status   Specimen Description BLOOD RIGHT HAND  Final   Special Requests   Final    BOTTLES DRAWN AEROBIC AND ANAEROBIC Blood Culture results may not be optimal due to an inadequate volume of blood received in culture bottles   Culture   Final    NO GROWTH < 12 HOURS Performed at [facility], [place] [address], [place], [place] [address]    Report Status PENDING  Incomplete  Expectorated sputum assessment w rflx to resp cult     Status: None (Preliminary result)   Collection Time: 02/16/19 12:02 AM   Specimen: Sputum  Result Value Ref Range Status   Specimen Description SPUTUM  Final   Special Requests NONE  Final   Sputum evaluation   Final    THIS SPECIMEN IS ACCEPTABLE FOR SPUTUM CULTURE Performed at [facility], [place] [address], [place], [place] [address]    Report Status PENDING  Incomplete  Culture, respiratory     Status: None (Preliminary result)   Collection Time:  02/16/19 12:02 AM   Specimen: SPU  Result Value Ref Range Status   Specimen Description SPUTUM  Final   Special Requests NONE Reflexed from [id number]  Final   Gram Stain   Final    MODERATE WBC PRESENT, PREDOMINANTLY PMN FEW SQUAMOUS EPITHELIAL CELLS PRESENT ABUNDANT GRAM POSITIVE COCCI MODERATE GRAM NEGATIVE COCCOBACILLI FEW GRAM NEGATIVE RODS FEW GRAM POSITIVE RODS    Culture   Final    CULTURE REINCUBATED FOR BETTER GROWTH Performed at [facility], [place] [address] [place], [place] [address]    Report Status PENDING  Incomplete  Blood Culture (routine x 2)     Status: None (Preliminary result)   Collection Time: 02/16/19  1:30 AM   Specimen: BLOOD LEFT ARM  Result Value Ref Range Status   Specimen Description BLOOD LEFT ARM  Final   Special Requests   Final    BOTTLES DRAWN AEROBIC AND ANAEROBIC Blood Culture adequate volume   Culture   Final    NO GROWTH < 12 HOURS Performed at [facility], [place] [address],  [place], [place] [address]    Report Status PENDING  Incomplete  Urine culture     Status: None   Collection Time: 02/16/19  3:05 AM   Specimen: In/Out Cath Urine  Result Value Ref Range Status   Specimen Description IN/OUT CATH URINE  Final   Special Requests NONE  Final   Culture   Final    NO GROWTH Performed at [facility], [place] [address], [place],  [address]    Report Status 02/16/2019 FINAL  Final  SARS CORONAVIRUS 2 (TAT 6-24 HRS) Nasopharyngeal Nasal Mucosa     Status: None   Collection Time: 02/16/19  3:30 AM   Specimen: Nasal Mucosa; Nasopharyngeal  Result Value Ref Range Status   SARS Coronavirus 2 NEGATIVE NEGATIVE Final    Comment: (NOTE) SARS-CoV-2 target nucleic acids are NOT DETECTED. The SARS-CoV-2 RNA is generally detectable in upper and lower respiratory specimens during the acute phase of infection. Negative results do not preclude SARS-CoV-2 infection, do not rule out co-infections with other pathogens, and should not be used as the sole basis for treatment or other patient management decisions. Negative results must be combined with clinical observations, patient history, and epidemiological information. The expected result is Negative. Fact Sheet for Patients: SugarRoll.be Fact Sheet for Healthcare Providers: https://www.woods-mathews.com/ This test is not yet approved or cleared by the [place] FDA and  has been authorized for detection and/or diagnosis of SARS-CoV-2 by FDA under an Emergency Use Authorization (EUA). This EUA will remain  in effect (meaning this test can be used) for the duration of the COVID-19 declaration under Section 56 4(b)(1) of the Act, 21 U.S.C. section 360bbb-3(b)(1), unless the authorization is terminated or revoked sooner. Performed at [facility], [place] [address], [place],  [address]   Respiratory Panel by PCR     Status: None   Collection Time: 02/16/19   4:05 AM   Specimen: Flu Kit Nasopharyngeal Swab; Respiratory  Result Value Ref Range Status   Adenovirus NOT DETECTED NOT DETECTED Final   Coronavirus 229E NOT DETECTED NOT DETECTED Final    Comment: (NOTE) The Coronavirus on the Respiratory Panel, DOES NOT test for the novel  Coronavirus (2019 nCoV)    Coronavirus HKU1 NOT DETECTED NOT DETECTED Final   Coronavirus NL63 NOT DETECTED NOT DETECTED Final   Coronavirus OC43 NOT DETECTED NOT DETECTED Final   Metapneumovirus  NOT DETECTED NOT DETECTED Final   Rhinovirus / Enterovirus NOT DETECTED NOT DETECTED Final   Influenza A NOT DETECTED NOT DETECTED Final   Influenza B NOT DETECTED NOT DETECTED Final   Parainfluenza Virus 1 NOT DETECTED NOT DETECTED Final   Parainfluenza Virus 2 NOT DETECTED NOT DETECTED Final   Parainfluenza Virus 3 NOT DETECTED NOT DETECTED Final   Parainfluenza Virus 4 NOT DETECTED NOT DETECTED Final   Respiratory Syncytial Virus NOT DETECTED NOT DETECTED Final   Bordetella pertussis NOT DETECTED NOT DETECTED Final   Chlamydophila pneumoniae NOT DETECTED NOT DETECTED Final   Mycoplasma pneumoniae NOT DETECTED NOT DETECTED Final    Comment: Performed at [facility], [place] [address], [place], [place] [address]  MRSA PCR Screening     Status: None   Collection Time: 02/16/19  5:21 AM   Specimen: Nasal Mucosa; Nasopharyngeal  Result Value Ref Range Status   MRSA by PCR NEGATIVE NEGATIVE Final    Comment:        The GeneXpert MRSA Assay (FDA approved for NASAL specimens only), is one component of a comprehensive MRSA colonization surveillance program. It is not intended to diagnose MRSA infection nor to guide or monitor treatment for MRSA infections. Performed at [facility], [place] [address] [place], [place] [address]          Radiology Studies: DG Chest 2 View  Result Date: 02/15/2019 CLINICAL DATA:  Shortness of breath EXAM: CHEST - 2 VIEW COMPARISON:  01/18/2019 FINDINGS: Cardiomegaly.  Diffuse interstitial prominence and patchy bilateral airspace disease again noted, similar to prior study. Heart is mildly enlarged. No effusions or acute bony abnormality. IMPRESSION: Diffuse interstitial prominence and patchy bilateral airspace disease, similar to prior study. Findings concerning for multifocal pneumonia. Electronically Signed   By: Rolm Baptise M.D.   On: 02/15/2019 21:32   CT Angio Chest PE W and/or Wo Contrast  Result Date: 02/16/2019 CLINICAL DATA:  Shortness of breath EXAM: CT ANGIOGRAPHY CHEST WITH CONTRAST TECHNIQUE: Multidetector CT imaging of the chest was performed using the standard protocol during bolus administration of intravenous contrast. Multiplanar CT image reconstructions and MIPs were obtained to evaluate the vascular anatomy. CONTRAST:  52m OMNIPAQUE IOHEXOL 350 MG/ML SOLN COMPARISON:  01/19/2019 FINDINGS: Cardiovascular: No filling defects in the pulmonary arteries to suggest pulmonary emboli. Mild cardiomegaly. Aorta normal caliber. Mediastinum/Nodes: Mildly prominent right paratracheal lymph nodes with index node measuring 14 mm, stable since prior study. No axillary or hilar adenopathy. Lungs/Pleura: Extensive ground-glass airspace opacities throughout both lungs, worsening since prior study. No effusions. 18 mm nodule in the right middle lobe. This was present on prior study, partially cavitary and measuring 21 mm at that time. Upper Abdomen: Imaging into the upper abdomen shows no acute findings. Musculoskeletal: Chest wall soft tissues are unremarkable. No acute bony abnormality. Review of the MIP images confirms the above findings. IMPRESSION: Worsening extensive bilateral ground-glass airspace opacities most compatible with pneumonia. 18 mm nodule in the right middle lobe. This nodule was 21 mm on prior study and partially cavitary. Mildly enlarged right paratracheal lymph nodes, stable. No evidence of pulmonary embolus. Cardiomegaly. Electronically Signed   By:  KRolm BaptiseM.D.   On: 02/16/2019 02:12        Scheduled Meds: . allopurinol  100 mg Oral BID  . amLODipine  2.5 mg Oral Daily  . enoxaparin (LOVENOX) injection  40 mg Subcutaneous Q0600  . ferrous sulfate  325 mg Oral Q breakfast  . mouth  rinse  15 mL Mouth Rinse BID  . pantoprazole  40 mg Oral Daily  . sertraline  200 mg Oral q morning - 10a   Continuous Infusions: . sodium chloride 10 mL/hr at 02/16/19 1700  . ceFEPime (MAXIPIME) IV 2 g (02/16/19 2314)  . linezolid (ZYVOX) IV 600 mg (02/16/19 2138)     LOS: 1 day    Time spent: 35 minutes.     Elmarie Shiley, MD [facility]   If 7PM-7AM, please contact night-coverage  02/17/2019, 9:34 AM

## 2019-02-17 NOTE — Evaluation (Signed)
Physical Therapy Evaluation Patient Details Name: Kurt [name] MRN: [id number] DOB: [birth year] Today's Date: 02/17/2019   History of Present Illness  Pt is 69 y.o. male with medical history significant of chronic diastolic congestive heart failure, hypertension, CKD stage III, recent COVID pneumonia (COVID in 11/2018) presenting to the ED for evaluation of shortness of breath. Pt admitted with resp failure and PNE.  CT Chest negative for PE.  TB results pending.  Clinical Impression  Pt admitted with above diagnosis. Pt is normally a very active individual.  Since he had COVID in November he has been in/out of hospital with limited activity.  Pt reports difficulty with ambulation and ADLs at home.  Pt requiring min guard today for activity.  He was able to ambulate 4' but with severe dyspnea and decreased O2 sats on 5 L HFNC.  Will benefit from acute PT to progress.   Pt currently with functional limitations due to the deficits listed below (see PT Problem List). Pt will benefit from skilled PT to increase their independence and safety with mobility to allow discharge to the venue listed below.       Follow Up Recommendations CIR    Equipment Recommendations  Other (comment)(defer to next venue)    Recommendations for Other Services Rehab consult;OT consult     Precautions / Restrictions Precautions Precautions: Fall Precaution Comments: monitor O2      Mobility  Bed Mobility Overal bed mobility: Needs Assistance Bed Mobility: Supine to Sit     Supine to sit: Supervision     General bed mobility comments: increased time to catch breath  Transfers Overall transfer level: Needs assistance Equipment used: None Transfers: Sit to/from Stand Sit to Stand: Min guard         General transfer comment: performed x 2; cues for safe hand placement  Ambulation/Gait Ambulation/Gait assistance: Min guard Gait Distance (Feet): 4 Feet Assistive device: None Gait  Pattern/deviations: Decreased stride length;Wide base of support     General Gait Details: 4'x2 with rest break; limited by dyspnea  Stairs            Wheelchair Mobility    Modified Rankin (Stroke Patients Only)       Balance Overall balance assessment: Needs assistance Sitting-balance support: No upper extremity supported;Feet supported Sitting balance-Leahy Scale: Good     Standing balance support: During functional activity;No upper extremity supported Standing balance-Leahy Scale: Fair                               Pertinent Vitals/Pain Pain Assessment: No/denies pain    Home Living Family/patient expects to be discharged to:: Private residence Living Arrangements: Alone Available Help at Discharge: Family;Available PRN/intermittently Type of Home: House Home Access: Stairs to enter   [employer] of Steps: 7 with one handrail in midline Home Layout: One level Home Equipment: Clinical cytogeneticist - 4 wheels;Walker - 2 wheels;Cane - single point      Prior Function Level of Independence: Needs assistance   Gait / Transfers Assistance Needed: Reports recent fall by tripping on O2 tubing; Has had a few good days since Novemeber that he was able to get out but gradually getting weaker and recently only household ambulation with RW - limited by O2 sats  ADL's / Homemaking Assistance Needed: Independent with ADLs but increased time due to O2 sats  Comments: Prior to having COVID in November he was very independent.  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     Hand Dominance        Extremity/Trunk Assessment   Upper Extremity Assessment Upper Extremity Assessment: LUE deficits/detail;RUE deficits/detail RUE Deficits / Details: ROM WFL; MMT 5/5 except elbow 4/5 from chronic injury LUE Deficits / Details: ROM WFL; MMT shoulder and elbow 4/5 from chronic injury     Lower Extremity Assessment Lower Extremity Assessment: Overall WFL for tasks assessed    Cervical / Trunk Assessment Cervical / Trunk Assessment: Normal  Communication      Cognition Arousal/Alertness: Awake/alert Behavior During Therapy: WFL for tasks assessed/performed Overall Cognitive Status: Within Functional Limits for tasks assessed                                        General Comments General comments (skin integrity, edema, etc.): Pt on 5 L HFNC with sats 96% at rest, dropping to 92% during activity and 88% during recovery and pt with 3/4 on dyspnea scale with minimal activity; took 1-2 minutes to recover to 95%    Exercises     Assessment/Plan    PT Assessment Patient needs continued PT services  PT Problem List Decreased strength;Decreased mobility;Decreased activity tolerance;Cardiopulmonary status limiting activity;Decreased balance;Decreased knowledge of use of DME       PT Treatment Interventions Therapeutic activities;DME instruction;Gait training;Therapeutic exercise;Patient/family education;Stair training;Balance training;Functional mobility training    PT Goals (Current goals can be found in the Care Plan section)  Acute Rehab PT Goals Patient Stated Goal: get better, breath better; does not want to go to SNF if possible PT Goal Formulation: With patient Time For Goal Achievement: 03/04/19 Potential to Achieve Goals: Good    Frequency Min 3X/week   Barriers to discharge Decreased caregiver support      Co-evaluation               AM-PAC PT "6 Clicks" Mobility  Outcome Measure Help needed turning from your back to your side while in a flat bed without using bedrails?: None Help needed moving from lying on your back to sitting on the side of a flat bed without using bedrails?: A Little Help needed moving to and from a bed to a chair (including a wheelchair)?: None Help needed standing up from a chair using your arms (e.g.,  wheelchair or bedside chair)?: None Help needed to walk in hospital room?: None Help needed climbing 3-5 steps with a railing? : A Lot 6 Click Score: 21    End of Session Equipment Utilized During Treatment: Oxygen Activity Tolerance: (limited by O2 sats) Patient left: in chair;with call bell/phone within reach Nurse Communication: Mobility status PT Visit Diagnosis: History of falling (Z91.81);Muscle weakness (generalized) (M62.81);Unsteadiness on feet (R26.81)    Time: 5456-2563 PT Time Calculation (min) (ACUTE ONLY): 40 min   Charges:   PT Evaluation $PT Eval Moderate Complexity: 1 Mod PT Treatments $Therapeutic Activity: 8-22 mins        Royetta Asal, PT Acute Rehab Services Pager [phone] [facility] [phone] [facility] [phone]   Rayetta Humphrey 02/17/2019, 2:08 PM

## 2019-02-17 NOTE — Evaluation (Signed)
Clinical/Bedside Swallow Evaluation Patient Details  Name: Kurt [name] MRN: [id number] Date of Birth: 1950-04-30  Today's Date: 02/17/2019 Time: SLP Start Time (ACUTE ONLY): 1045 SLP Stop Time (ACUTE ONLY): 1057 SLP Time Calculation (min) (ACUTE ONLY): 12 min  Past Medical History:  Past Medical History:  Diagnosis Date  . Arthritis   . CHF (congestive heart failure) (HCC)    diastolic dysfunction  . Chronic back pain   . COVID-19   . Depression   . Diverticulitis   . GI bleed 05/2018  . Hypertension   . PTSD (post-traumatic stress disorder)    after  a Fall   . Renal insufficiency    Decrease in GFR- PCP removed all NSAIDS & aspirin    Past Surgical History:  Past Surgical History:  Procedure Laterality Date  . BIOPSY  06/16/2018   Procedure: BIOPSY;  Surgeon: Irene Shipper, MD;  Location: [facility] ENDOSCOPY;  Service: Endoscopy;;  . ELBOW FRACTURE SURGERY     ORIF, Shoulder manipulation    . ESOPHAGOGASTRODUODENOSCOPY N/A 04/30/2014   Procedure: ESOPHAGOGASTRODUODENOSCOPY (EGD);  Surgeon: Carol Ada, MD;  Location: [facility] ENDOSCOPY;  Service: Endoscopy;  Laterality: N/A;  . ESOPHAGOGASTRODUODENOSCOPY (EGD) WITH PROPOFOL N/A 06/16/2018   Procedure: ESOPHAGOGASTRODUODENOSCOPY (EGD) WITH PROPOFOL;  Surgeon: Irene Shipper, MD;  Location: [facility] ENDOSCOPY;  Service: Endoscopy;  Laterality: N/A;  . HEMOSTASIS CLIP PLACEMENT  06/16/2018   Procedure: HEMOSTASIS CLIP PLACEMENT;  Surgeon: Irene Shipper, MD;  Location: [facility] ENDOSCOPY;  Service: Endoscopy;;  . LUMBAR LAMINECTOMY/DECOMPRESSION MICRODISCECTOMY N/A 02/16/2015   Procedure: Thoracic ten - thoracic twelve laminectomy;  Surgeon: Earnie Larsson, MD;  Location: [place] NEURO ORS;  Service: Neurosurgery;  Laterality: N/A;  . LUMBAR LAMINECTOMY/DECOMPRESSION MICRODISCECTOMY N/A 03/22/2016   Procedure: Lumbar One-Two, Lumbar Two-Three, Lumbar Three-Four Laminectomy and Foraminotomy;  Surgeon: Earnie Larsson, MD;  Location: [place];  Service: Neurosurgery;   Laterality: N/A;  . NASAL SEPTUM SURGERY    . SCLEROTHERAPY  06/16/2018   Procedure: SCLEROTHERAPY;  Surgeon: Irene Shipper, MD;  Location: [facility] ENDOSCOPY;  Service: Endoscopy;;   HPI:  Kurt [name] is a 69 y.o. male with medical history significant of chronic diastolic congestive heart failure, hypertension, CKD stage III, recent COVID pneumonia presenting to the ED for evaluation of shortness of breath. No previously documented dysphagia hx.    Assessment / Plan / Recommendation Clinical Impression  Pt was seen for a bedside swallow evaluation and he appears to have functional oropharyngeal swallowing abilities.  He denied previous or current dysphagia and stated that he was tolerating regular solids and thin liquids without difficulty.  Oral mechanism exam was unremarkable.  Pt consumed trials of thin liquid, puree, and regular solids.  He exhibited appropriate bolus acceptance, timely mastication and AP transport, and consistent hyolaryngeal elevation/excursion across trials.  No clinical s/sx of aspiration were observed with any trials despite challenging.  Recommend continuation of regular solids and thin liquids with medications administered whole with thin liquid.  No further skilled ST is warranted at this time.  Please re-consult if additional needs arise.    SLP Visit Diagnosis: Dysphagia, unspecified (R13.10)    Aspiration Risk  No limitations    Diet Recommendation Regular;Thin liquid   Liquid Administration via: Cup;Straw Medication Administration: Whole meds with liquid Supervision: Patient able to self feed    Other  Recommendations Oral Care Recommendations: Oral care BID   Follow up Recommendations None      Frequency and Duration  Prognosis Prognosis for Safe Diet Advancement: Good      Swallow Study   General HPI: Kurt [name] is a 69 y.o. male with medical history significant of chronic diastolic congestive heart failure, hypertension, CKD stage  III, recent COVID pneumonia presenting to the ED for evaluation of shortness of breath. No previously documented dysphagia hx.  Type of Study: Bedside Swallow Evaluation Previous Swallow Assessment: None  Diet Prior to this Study: Regular;Thin liquids Temperature Spikes Noted: No Respiratory Status: Nasal cannula History of Recent Intubation: No Behavior/Cognition: Alert;Cooperative;Pleasant mood Oral Cavity Assessment: Within Functional Limits Oral Care Completed by SLP: No Oral Cavity - Dentition: Adequate natural dentition Vision: Functional for self-feeding Self-Feeding Abilities: Able to feed self Patient Positioning: Upright in bed Baseline Vocal Quality: Normal Volitional Cough: Strong Volitional Swallow: Able to elicit    Oral/Motor/Sensory Function Overall Oral Motor/Sensory Function: Within functional limits   Ice Chips Ice chips: Not tested   Thin Liquid Thin Liquid: Within functional limits    Nectar Thick Nectar Thick Liquid: Not tested   Honey Thick Honey Thick Liquid: Not tested   Puree Puree: Within functional limits   Solid     Solid: Within functional limits     Villa Herb., M.S., CCC-SLP Acute Rehabilitation Services Office: [phone]  Shanon Rosser Maryella Abood 02/17/2019,11:19 AM

## 2019-02-18 ENCOUNTER — Inpatient Hospital Stay (HOSPITAL_COMMUNITY): Payer: Medicare Other

## 2019-02-18 ENCOUNTER — Encounter (HOSPITAL_COMMUNITY)
Admission: EM | Disposition: A | Discharge status: Rehabilitation Facility | Payer: Self-pay | Source: Home / Self Care | Attending: Internal Medicine

## 2019-02-18 HISTORY — PX: VIDEO BRONCHOSCOPY: SHX5072

## 2019-02-18 LAB — BODY FLUID CELL COUNT WITH DIFFERENTIAL
Eos, Fluid: 4 %
Lymphs, Fluid: 60 %
Monocyte-Macrophage-Serous Fluid: 26 % — ABNORMAL LOW (ref 50–90)
Neutrophil Count, Fluid: 10 % (ref 0–25)
Total Nucleated Cell Count, Fluid: 393 cu mm (ref 0–1000)

## 2019-02-18 LAB — RESPIRATORY PANEL BY PCR

## 2019-02-18 LAB — QUANTIFERON-TB GOLD PLUS: QuantiFERON-TB Gold Plus: NEGATIVE

## 2019-02-18 LAB — QUANTIFERON-TB GOLD PLUS (RQFGPL)
QuantiFERON Mitogen Value: 1.82 IU/mL
QuantiFERON Nil Value: 0.1 IU/mL
QuantiFERON TB1 Ag Value: 0.09 IU/mL
QuantiFERON TB2 Ag Value: 0.08 IU/mL

## 2019-02-18 SURGERY — VIDEO BRONCHOSCOPY WITHOUT FLUORO
Anesthesia: Moderate Sedation | Laterality: Bilateral

## 2019-02-18 MED ORDER — PHENYLEPHRINE HCL 0.25 % NA SOLN
1.0000 | Freq: Four times a day (QID) | NASAL | Status: DC | PRN
  Filled 2019-02-18: qty 15

## 2019-02-18 MED ORDER — BUTAMBEN-TETRACAINE-BENZOCAINE 2-2-14 % EX AERO
1.0000 | INHALATION_SPRAY | Freq: Once | CUTANEOUS | Status: DC
  Filled 2019-02-18: qty 20

## 2019-02-18 MED ORDER — FENTANYL CITRATE (PF) 100 MCG/2ML IJ SOLN
INTRAMUSCULAR | Status: AC
  Filled 2019-02-18: qty 4

## 2019-02-18 MED ORDER — FENTANYL CITRATE (PF) 100 MCG/2ML IJ SOLN
INTRAMUSCULAR | Status: DC | PRN
  Administered 2019-02-18 (×2): 50 ug via INTRAVENOUS

## 2019-02-18 MED ORDER — LIDOCAINE HCL URETHRAL/MUCOSAL 2 % EX GEL
1.0000 "application " | Freq: Once | CUTANEOUS | Status: DC
  Filled 2019-02-18: qty 20

## 2019-02-18 MED ORDER — MIDAZOLAM HCL (PF) 5 MG/ML IJ SOLN
INTRAMUSCULAR | Status: AC
  Filled 2019-02-18: qty 2

## 2019-02-18 MED ORDER — LIDOCAINE HCL URETHRAL/MUCOSAL 2 % EX GEL
CUTANEOUS | Status: DC | PRN
  Administered 2019-02-18: 1

## 2019-02-18 MED ORDER — MIDAZOLAM HCL (PF) 10 MG/2ML IJ SOLN
INTRAMUSCULAR | Status: DC | PRN
  Administered 2019-02-18 (×2): 1 mg via INTRAVENOUS

## 2019-02-18 MED ORDER — PHENYLEPHRINE HCL 0.25 % NA SOLN
NASAL | Status: DC | PRN
  Administered 2019-02-18: 2 via NASAL

## 2019-02-18 MED ORDER — LIDOCAINE HCL 1 % IJ SOLN
INTRAMUSCULAR | Status: DC | PRN
  Administered 2019-02-18: 6 mL via RESPIRATORY_TRACT

## 2019-02-18 NOTE — Progress Notes (Signed)
PROGRESS NOTE    Kurt [name]  UXL:[id number] DOB: May 06, 1950 DOA: 02/15/2019 PCP: Leonard Downing, MD   Brief Narrative: 69 year old with past medical history significant for chronic diastolic heart failure, hypertension, CKD stage III, recent Covid pneumonia, diagnosed during Thanksgiving.  Since Covid diagnosis patient has been admitted twice, treated for pneumonia.  His oxygen requirement has increased to 6 L of oxygen from 2 L on discharge.  He reports shortness of breath on exertion, increased oxygen requirement on exertion. Note patient was admitted from 12/6 1-12/16 for acute hypoxic respiratory failure secondary to COVID-19 pneumonia.  He was treated with remdesivir and steroids.  Readmitted 12/28 on ~1/2 for worsening dyspnea and hypoxemia treated for pneumonia at that time with Augmentin for 3 weeks. Presented with worsening shortness of breath, fever, CT angio negative for PE but showed bilateral groundglass opacity.  Assessment & Plan:   Principal Problem:   Cavitary pneumonia Active Problems:   Hypertension   History of COVID-19   Acute respiratory failure with hypoxia (HCC)   CHF (congestive heart failure) (HCC)   Acute on chronic respiratory failure (HCC)   Multifocal pneumonia   Diarrhea   Respiratory failure, acute (HCC)  1-Acute hypoxic Respiratory Failure; secondary to pneumonia -Ct angio; worsening bilateral ground glass opacities most compatible with PNA.18 mm nodule right middle lobe. Negative for PE>  -Sputum culture: Abundant gram-positive cocci, moderate gram-negative coccobacilli, few gram-negative rods and gram-positive rods results pending.  Culture regrowth -Treated initially  with cefepime and linezolid. -SARS coronavirus 2 - -ID has been consulted. Recommend to continue with cefepime, linezolid stopped, needs to check [employer] BAL.  -Respiratory  panel negative. -Stopped  Dexamethasone as recommend by Pulmonologist  -QuantiFERON-TB gold  pending. -Follow blood cultures: 12 today. -plan for Bronchoscopy 1-28 -no evidence of aspiration on swallow eval.   2-Diarrhea: GI pathogen and C. difficile order. Resolved.   3-OSA/OHS undiagnosed: He will need a sleep study Start CPAP/  Will ask CM to see if they can help with HH CPAP.   Hypertension: Systolic blood pressure normal range. Resume Norvasc and hold lisinopril.   Chronic diastolic congestive heart failure: Does not appear volume overload BNP normal at 47.  Troponins negative  CKD stage A; cr stable.   Estimated body mass index is 37.96 kg/m as calculated from the following:   Height as of this encounter: 5' 10.5" (1.791 m).   Weight as of this encounter: 121.7 kg.   DVT prophylaxis: Lovenox Code Status: Full code Family Communication: Care discussed with patient Disposition Plan: Patient from home, remain in the hospital for treatment of acute on chronic hypoxic respiratory failure, recurrent pneumonia, increased oxygen requirement, treating with IV antibiotics, Plan for bronchoscopy on 1/28. -Needs PT eval  -Awaiting improvement of respiratory failure, suspect discharge home in 3 days.  Consultants:   Pulmonologist  ID  Procedures:   None  Antimicrobials:  Cefepime Linezolid  Subjective: He is breathing ok, oxygen drops with movement.  Diarrhea resolved. He was not offer BIPAP last night.    Objective: Vitals:   02/17/19 1910 02/17/19 2039 02/18/19 0418 02/18/19 1233  BP:  133/70 (!) 145/82 137/68  Pulse: 82 79 71   Resp:  18 (!) 22 20  Temp:  98.8 F (37.1 C) 97.6 F (36.4 C) 98.2 F (36.8 C)  TempSrc:  Oral Oral Oral  SpO2: 95% 98% 99% 99%  Weight:      Height:        Intake/Output Summary (Last 24  hours) at 02/18/2019 1403 Last data filed at 02/17/2019 1800 Gross per 24 hour  Intake 560 ml  Output 400 ml  Net 160 ml   Filed Weights   02/15/19 2314 02/16/19 1422 02/17/19 0430  Weight: 112.5 kg 121.5 kg 121.7 kg     Examination:  General exam: NAD Respiratory system: BL crackles Cardiovascular system; S 1. S 2 RRR Gastrointestinal system: BS present, soft, nt Central nervous system: alert, follows command Extremities: no edema Skin: no rashes.    Data Reviewed: I have personally reviewed following labs and imaging studies  CBC: Recent Labs  Lab 02/15/19 2213 02/17/19 0355  WBC 11.3* 11.1*  NEUTROABS 9.0* 9.4*  HGB 11.5* 10.5*  HCT 36.5* 34.2*  MCV 88.0 87.9  PLT 382 354   Basic Metabolic Panel: Recent Labs  Lab 02/15/19 2304 02/17/19 0355  NA 140 137  K 4.5 4.2  CL 108 104  CO2 25 25  GLUCOSE 119* 111*  BUN 13 14  CREATININE 0.93 0.96  CALCIUM 8.6* 8.4*  MG 2.1  --    GFR: Estimated Creatinine Clearance: 94.4 mL/min (by C-G formula based on SCr of 0.96 mg/dL). Liver Function Tests: Recent Labs  Lab 02/15/19 2304  AST 25  ALT 26  ALKPHOS 67  BILITOT 0.7  PROT 6.2*  ALBUMIN 2.6*   No results for input(s): LIPASE, AMYLASE in the last 168 hours. No results for input(s): AMMONIA in the last 168 hours. Coagulation Profile: Recent Labs  Lab 02/16/19 0130  INR 1.2   Cardiac Enzymes: No results for input(s): CKTOTAL, CKMB, CKMBINDEX, TROPONINI in the last 168 hours. BNP (last 3 results) No results for input(s): PROBNP in the last 8760 hours. HbA1C: No results for input(s): HGBA1C in the last 72 hours. CBG: No results for input(s): GLUCAP in the last 168 hours. Lipid Profile: No results for input(s): CHOL, HDL, LDLCALC, TRIG, CHOLHDL, LDLDIRECT in the last 72 hours. Thyroid Function Tests: No results for input(s): TSH, T4TOTAL, FREET4, T3FREE, THYROIDAB in the last 72 hours. Anemia Panel: Recent Labs    02/16/19 0525  FERRITIN 145   Sepsis Labs: Recent Labs  Lab 02/16/19 0038 02/16/19 0136  PROCALCITON  --  <0.10  LATICACIDVEN 0.7  --     Recent Results (from the past 240 hour(s))  Blood Culture (routine x 2)     Status: None (Preliminary result)    Collection Time: 02/15/19 11:04 PM   Specimen: BLOOD RIGHT HAND  Result Value Ref Range Status   Specimen Description BLOOD RIGHT HAND  Final   Special Requests   Final    BOTTLES DRAWN AEROBIC AND ANAEROBIC Blood Culture results may not be optimal due to an inadequate volume of blood received in culture bottles   Culture   Final    NO GROWTH 3 DAYS Performed at [facility], [place] [address] [place], [place] [address]    Report Status PENDING  Incomplete  Expectorated sputum assessment w rflx to resp cult     Status: None (Preliminary result)   Collection Time: 02/16/19 12:02 AM   Specimen: Sputum  Result Value Ref Range Status   Specimen Description SPUTUM  Final   Special Requests NONE  Final   Sputum evaluation   Final    THIS SPECIMEN IS ACCEPTABLE FOR SPUTUM CULTURE Performed at [facility], [place] [address] [place], [place] [address]    Report Status PENDING  Incomplete  Culture, respiratory     Status: None (Preliminary  result)   Collection Time: 02/16/19 12:02 AM   Specimen: SPU  Result Value Ref Range Status   Specimen Description SPUTUM  Final   Special Requests NONE Reflexed from [id number]  Final   Gram Stain   Final    MODERATE WBC PRESENT, PREDOMINANTLY PMN FEW SQUAMOUS EPITHELIAL CELLS PRESENT ABUNDANT GRAM POSITIVE COCCI MODERATE GRAM NEGATIVE COCCOBACILLI FEW GRAM NEGATIVE RODS FEW GRAM POSITIVE RODS    Culture   Final    FEW GRAM NEGATIVE RODS CULTURE REINCUBATED FOR BETTER GROWTH Performed at [facility], [place] [address], [place], [place] [address]    Report Status PENDING  Incomplete  Blood Culture (routine x 2)     Status: None (Preliminary result)   Collection Time: 02/16/19  1:30 AM   Specimen: BLOOD LEFT ARM  Result Value Ref Range Status   Specimen Description BLOOD LEFT ARM  Final   Special Requests   Final    BOTTLES DRAWN AEROBIC AND ANAEROBIC Blood Culture adequate volume   Culture   Final    NO GROWTH 2 DAYS Performed at  [facility], [place] [address] [place], [place] [address]    Report Status PENDING  Incomplete  Urine culture     Status: None   Collection Time: 02/16/19  3:05 AM   Specimen: In/Out Cath Urine  Result Value Ref Range Status   Specimen Description IN/OUT CATH URINE  Final   Special Requests NONE  Final   Culture   Final    NO GROWTH Performed at [facility], [place] [address], [place], [place] [address]    Report Status 02/16/2019 FINAL  Final  SARS CORONAVIRUS 2 (TAT 6-24 HRS) Nasopharyngeal Nasal Mucosa     Status: None   Collection Time: 02/16/19  3:30 AM   Specimen: Nasal Mucosa; Nasopharyngeal  Result Value Ref Range Status   SARS Coronavirus 2 NEGATIVE NEGATIVE Final    Comment: (NOTE) SARS-CoV-2 target nucleic acids are NOT DETECTED. The SARS-CoV-2 RNA is generally detectable in upper and lower respiratory specimens during the acute phase of infection. Negative results do not preclude SARS-CoV-2 infection, do not rule out co-infections with other pathogens, and should not be used as the sole basis for treatment or other patient management decisions. Negative results must be combined with clinical observations, patient history, and epidemiological information. The expected result is Negative. Fact Sheet for Patients: SugarRoll.be Fact Sheet for Healthcare Providers: https://www.woods-mathews.com/ This test is not yet approved or cleared by the [place] FDA and  has been authorized for detection and/or diagnosis of SARS-CoV-2 by FDA under an Emergency Use Authorization (EUA). This EUA will remain  in effect (meaning this test can be used) for the duration of the COVID-19 declaration under Section 56 4(b)(1) of the Act, 21 U.S.C. section 360bbb-3(b)(1), unless the authorization is terminated or revoked sooner. Performed at [facility], [place] [address] [place], [place] [address]   Respiratory Panel by PCR      Status: None   Collection Time: 02/16/19  4:05 AM   Specimen: Flu Kit Nasopharyngeal Swab; Respiratory  Result Value Ref Range Status   Adenovirus NOT DETECTED NOT DETECTED Final   Coronavirus 229E NOT DETECTED NOT DETECTED Final    Comment: (NOTE) The Coronavirus on the Respiratory Panel, DOES NOT test for the novel  Coronavirus (2019 nCoV)    Coronavirus HKU1 NOT DETECTED NOT DETECTED Final   Coronavirus NL63 NOT DETECTED NOT DETECTED Final   Coronavirus OC43  NOT DETECTED NOT DETECTED Final   Metapneumovirus NOT DETECTED NOT DETECTED Final   Rhinovirus / Enterovirus NOT DETECTED NOT DETECTED Final   Influenza A NOT DETECTED NOT DETECTED Final   Influenza B NOT DETECTED NOT DETECTED Final   Parainfluenza Virus 1 NOT DETECTED NOT DETECTED Final   Parainfluenza Virus 2 NOT DETECTED NOT DETECTED Final   Parainfluenza Virus 3 NOT DETECTED NOT DETECTED Final   Parainfluenza Virus 4 NOT DETECTED NOT DETECTED Final   Respiratory Syncytial Virus NOT DETECTED NOT DETECTED Final   Bordetella pertussis NOT DETECTED NOT DETECTED Final   Chlamydophila pneumoniae NOT DETECTED NOT DETECTED Final   Mycoplasma pneumoniae NOT DETECTED NOT DETECTED Final    Comment: Performed at [facility], [place] [address], [place], [place] [address]  MRSA PCR Screening     Status: None   Collection Time: 02/16/19  5:21 AM   Specimen: Nasal Mucosa; Nasopharyngeal  Result Value Ref Range Status   MRSA by PCR NEGATIVE NEGATIVE Final    Comment:        The GeneXpert MRSA Assay (FDA approved for NASAL specimens only), is one component of a comprehensive MRSA colonization surveillance program. It is not intended to diagnose MRSA infection nor to guide or monitor treatment for MRSA infections. Performed at [facility], [place] [address], [place], [place] [address]          Radiology Studies: No results found.      Scheduled Meds: . allopurinol  100 mg Oral BID  . amLODipine  2.5 mg Oral  Daily  . butamben-tetracaine-benzocaine  1 spray Topical Once  . [START ON 02/19/2019] enoxaparin (LOVENOX) injection  40 mg Subcutaneous Q0600  . ferrous sulfate  325 mg Oral Q breakfast  . lidocaine  1 application Topical Once  . mouth rinse  15 mL Mouth Rinse BID  . pantoprazole  40 mg Oral Daily  . sertraline  200 mg Oral q morning - 10a   Continuous Infusions: . sodium chloride 10 mL/hr at 02/16/19 1700  . ceFEPime (MAXIPIME) IV 2 g (02/18/19 1232)     LOS: 2 days    Time spent: 35 minutes.     Elmarie Shiley, MD [facility]   If 7PM-7AM, please contact night-coverage  02/18/2019, 2:03 PM

## 2019-02-18 NOTE — Progress Notes (Signed)
Placed patient on CPAP for the night via auto-mode with oxygen set at 5lpm.  

## 2019-02-18 NOTE — Plan of Care (Signed)
  Problem: Education: Goal: Knowledge of General Education information will improve Description: Including pain rating scale, medication(s)/side effects and non-pharmacologic comfort measures Outcome: Progressing   Problem: Clinical Measurements: Goal: Ability to maintain clinical measurements within normal limits will improve Outcome: Progressing   

## 2019-02-18 NOTE — Progress Notes (Addendum)
Pharmacy Antibiotic Note  Kurt [name] is a 69 y.o. male admitted on 02/15/2019 with pneumonia - noted pt with h/o COVID pna (diagnosed 11/25 and in-hospital until d/c on 01/23/2019) , CT chest 12/29 showed cavitary pna.  Pharmacy has been consulted for Cefepime dosing.   WBC 11, PCT<0.1, LA 0.7. Scr 0.96. Afebrile. Linezolid d/c per ID.   Plan: Cefepime 2gm IV q8h Will f/u renal function, micro data, and pt's clinical condition F/u cx results from BAL planned for 1/28  Height: 5' 10.5" (179.1 cm) Weight: 268 lb 5.9 oz (121.7 kg) IBW/kg (Calculated) : 74.15  Temp (24hrs), Avg:98.2 F (36.8 C), Min:97.6 F (36.4 C), Max:98.8 F (37.1 C)  Recent Labs  Lab 02/15/19 2213 02/15/19 2304 02/16/19 0038 02/17/19 0355  WBC 11.3*  --   --  11.1*  CREATININE  --  0.93  --  0.96  LATICACIDVEN  --   --  0.7  --     Estimated Creatinine Clearance: 94.4 mL/min (by C-G formula based on SCr of 0.96 mg/dL).    No Known Allergies  Antimicrobials this admission: 1/25 Cefepime >>  1/25 Linezolid >> 1/27  Microbiology results: 1/25 BCx: NGTD 1/26 UCx:  NG 1/26 Sputum:  Few GNR  1/26 Resp PCR: neg  Thank you for allowing pharmacy to be a part of this patient's care.  Sherlon Handing, PharmD, BCPS Please see amion for complete clinical pharmacist phone list 02/18/2019 10:43 AM

## 2019-02-18 NOTE — Progress Notes (Signed)
Video Bronchoscope Done Intervention Bronchial Washing Procedure tolerated well

## 2019-02-19 ENCOUNTER — Telehealth: Payer: Self-pay | Admitting: Pulmonary Disease

## 2019-02-19 ENCOUNTER — Encounter (HOSPITAL_COMMUNITY): Payer: Medicare Other

## 2019-02-19 ENCOUNTER — Other Ambulatory Visit: Payer: Self-pay | Admitting: Pulmonary Disease

## 2019-02-19 DIAGNOSIS — Z9889 Other specified postprocedural states: Secondary | ICD-10-CM

## 2019-02-19 DIAGNOSIS — R0681 Apnea, not elsewhere classified: Secondary | ICD-10-CM

## 2019-02-19 DIAGNOSIS — R918 Other nonspecific abnormal finding of lung field: Secondary | ICD-10-CM

## 2019-02-19 DIAGNOSIS — R0602 Shortness of breath: Secondary | ICD-10-CM

## 2019-02-19 LAB — CBC
HCT: 36.7 % — ABNORMAL LOW (ref 39.0–52.0)
Hemoglobin: 11.3 g/dL — ABNORMAL LOW (ref 13.0–17.0)
MCH: 26.8 pg (ref 26.0–34.0)
MCHC: 30.8 g/dL (ref 30.0–36.0)
MCV: 87.2 fL (ref 80.0–100.0)
Platelets: 474 10*3/uL — ABNORMAL HIGH (ref 150–400)
RBC: 4.21 MIL/uL — ABNORMAL LOW (ref 4.22–5.81)
RDW: 14.5 % (ref 11.5–15.5)
WBC: 9.2 10*3/uL (ref 4.0–10.5)
nRBC: 0 % (ref 0.0–0.2)

## 2019-02-19 LAB — BASIC METABOLIC PANEL
Anion gap: 9 (ref 5–15)
BUN: 15 mg/dL (ref 8–23)
CO2: 25 mmol/L (ref 22–32)
Calcium: 8.4 mg/dL — ABNORMAL LOW (ref 8.9–10.3)
Chloride: 102 mmol/L (ref 98–111)
Creatinine, Ser: 0.89 mg/dL (ref 0.61–1.24)
GFR calc Af Amer: >60 mL/min (ref >60)
GFR calc non Af Amer: >60 mL/min (ref >60)
Glucose, Bld: 86 mg/dL (ref 70–99)
Potassium: 4.4 mmol/L (ref 3.5–5.1)
Sodium: 136 mmol/L (ref 135–145)

## 2019-02-19 LAB — ACID FAST SMEAR (AFB, MYCOBACTERIA): Acid Fast Smear: NEGATIVE

## 2019-02-19 MED ORDER — PREDNISONE 20 MG PO TABS
60.0000 mg | ORAL_TABLET | Freq: Every day | ORAL | Status: DC
  Administered 2019-02-19 – 2019-02-22 (×4): 60 mg via ORAL
  Filled 2019-02-19 (×5): qty 3

## 2019-02-19 MED ORDER — IPRATROPIUM-ALBUTEROL 0.5-2.5 (3) MG/3ML IN SOLN
3.0000 mL | Freq: Three times a day (TID) | RESPIRATORY_TRACT | Status: DC
  Administered 2019-02-20 – 2019-02-23 (×11): 3 mL via RESPIRATORY_TRACT
  Filled 2019-02-19 (×10): qty 3

## 2019-02-19 MED ORDER — IPRATROPIUM-ALBUTEROL 0.5-2.5 (3) MG/3ML IN SOLN
3.0000 mL | Freq: Four times a day (QID) | RESPIRATORY_TRACT | Status: DC
  Administered 2019-02-19: 21:00:00 3 mL via RESPIRATORY_TRACT
  Filled 2019-02-19: qty 3

## 2019-02-19 NOTE — Progress Notes (Signed)
PIV consult: IV Team arrived to room, pt hesitant to return to bed for ultrasound PIV placement. No visible veins noted, per Freida Busman, RN. Requested RN re-enter consult when pt is in bed if PIV is still needed. No IV meds ordered at this time.

## 2019-02-19 NOTE — Telephone Encounter (Signed)
This needs to come from inpatient team/side. I haven't seen the patient before.

## 2019-02-19 NOTE — Progress Notes (Addendum)
    [facility] for Infectious Disease   Reason for visit: Follow up on SOB  Interval History: s/p BAL yesterday.  Results pending.  Discharging today.   Physical Exam: Constitutional:  Vitals:   02/19/19 0910 02/19/19 0912  BP: 124/70 124/70  Pulse:    Resp:    Temp:    SpO2: 98%    patient appears in NAD  Impression: sob, COVID vs other infection with cavitary, nodular opacities.   No indication for antibiotics so will stop cefepime. Waiting for results and has follow up arranged with pulmonary.  We are available for follow up if needed/if infection noted.  I will continue to monitor the cultures after discharge.

## 2019-02-19 NOTE — TOC Initial Note (Signed)
Transition of Care [facility]) - Initial/Assessment Note    Patient Details  Name: Kurt [name] MRN: [id number] Date of Birth: 1950/06/13  Transition [facility]) CM/SW Contact:    Bethena Roys, RN Phone Number: 02/19/2019, 3:56 PM  Clinical Narrative:   Patient presented for Pneumonia. Previously from home alone. Patient has oxygen in the home. In need of CPAP- patient agreeable to send referral to [facility] for CPAP. Case Manger was able to speak with pulmonary MD for CPAP settings and orders submitted to [facility]. Sleep study was scheduled via Pulmonary. Case Manager will continue to follow for additional transition of care needs.                  Expected Discharge Plan: [place] Barriers to Discharge: Continued Medical Work up(Awaiting AFB's)    Expected Discharge Plan and Services Expected Discharge Plan: [place] In-house Referral: NA Discharge Planning Services: CM Consult Post Acute Care Choice: Durable Medical Equipment Living arrangements for the past 2 months: Mobile Home Expected Discharge Date: 02/18/19               DME Arranged: Continuous positive airway pressure (CPAP)(SLeep study is scheduled and patient will have to pay initially until the sleep study is completed.) DME Agency: [facility] Date DME Agency Contacted: 02/19/19 Time DME Agency Contacted: 1000 Representative spoke with at DME Agency: Zack      Prior Living Arrangements/Services Living arrangements for the past 2 months: Mobile Home Lives with:: Self Patient language and need for interpreter reviewed:: Yes Do you feel safe going back to the place where you live?: Yes      Need for Family Participation in Patient Care: Yes (Comment) Care giver support system in place?: Yes (comment)      Activities of Daily Living Home Assistive Devices/Equipment: Gilford Rile (specify type) ADL Screening (condition at time of admission) Patient's cognitive ability  adequate to safely complete daily activities?: Yes Is the patient deaf or have difficulty hearing?: No Does the patient have difficulty seeing, even when wearing glasses/contacts?: No Does the patient have difficulty concentrating, remembering, or making decisions?: No Patient able to express need for assistance with ADLs?: Yes Does the patient have difficulty dressing or bathing?: No Independently performs ADLs?: Yes (appropriate for developmental age) Does the patient have difficulty walking or climbing stairs?: Yes Weakness of Legs: Both Weakness of Arms/Hands: None  Permission Sought/Granted Permission sought to share information with : Chartered certified accountant granted to share information with : Yes, Verbal Permission Granted     Permission granted to share info w AGENCY: [facility]        Emotional Assessment Appearance:: Appears stated age     Orientation: : Oriented to Self, Oriented to Place, Oriented to  Time, Oriented to Situation Alcohol / Substance Use: Not Applicable Psych Involvement: No (comment)  Admission diagnosis:  Respiratory failure, acute (HCC) [J96.00] Pulmonary nodule [R91.1] Acute respiratory failure with hypoxia (HCC) [J96.01] Acute on chronic respiratory failure, unspecified whether with hypoxia or hypercapnia (HCC) [J96.20] Multifocal pneumonia [J18.9] COVID-19 [U07.1] Patient Active Problem List   Diagnosis Date Noted  . Cavitary pneumonia 02/17/2019  . Multifocal pneumonia 02/16/2019  . Diarrhea 02/16/2019  . Respiratory failure, acute ([place]) 02/16/2019  . Pulmonary nodule   . OSA (obstructive sleep apnea) 01/20/2019  . Acute renal failure superimposed on stage 3a chronic [name] disease ([place]) 01/20/2019  . Chronic sinusitis 01/20/2019  . Acute on chronic respiratory failure ([place]) 01/18/2019  .  Failure to thrive in adult 01/18/2019  . Major depression, chronic 01/18/2019  . Obesity, Class III, BMI 40-49.9 (morbid obesity) (HCC)  01/18/2019  . History of COVID-19 12/27/2018  . Acute respiratory failure with hypoxia (HCC) 12/27/2018  . CKD (chronic [name] disease) stage 3, GFR 30-59 ml/min 12/27/2018  . CHF (congestive heart failure) (HCC) 12/27/2018  . QT prolongation 06/15/2018  . 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 GI bleed 04/29/2014  . Acute [name] injury superimposed on CKD (HCC) 04/29/2014  . TIA (transient ischemic attack) 07/08/2012  . Diverticulitis 07/08/2012  . Hypertension 07/08/2012   PCP:  Kaleen Mask, MD Pharmacy:   [facility] [phone] - [address], [place] - [address] AT [facility] [address] [place] [address] Phone: [phone] Fax: [phone]     Social Determinants of Health (SDOH) Interventions    Readmission Risk Interventions Readmission Risk Prevention Plan 02/19/2019  HRI or Home Care Consult Complete  Social Work Consult for Recovery Care Planning/Counseling Complete  Palliative Care Screening Not Applicable  Medication Review (RN Care Manager) Complete  Some recent data might be hidden

## 2019-02-19 NOTE — Op Note (Signed)
[facility] Cardiopulmonary Patient Name: Kurt [name] Pocedure Date: 02/18/2019 MRN: [id number] Attending MD: Rodman Pickle , MD Date of Birth: [birth year] CSN: Finalized Age: 69 Admit Type: Inpatient Gender: Male Procedure:             Bronchoscopy Indications:           Interstitial lung disease Providers:             Rodman Pickle, MD, Ciro Backer RRT, RCP, Kelby Fam RRT,RCP, Andre Lefort RRT,RCP Referring MD:           Medicines:             Lidocaine 2% Nebulizer mL Complications:         No immediate complications Estimated Blood Loss:  Estimated blood loss was minimal. Estimated blood                         loss: none. Procedure:             Pre-Anesthesia Assessment:                        - A History and Physical has been performed. Patient                         meds and allergies have been reviewed. The risks and                         benefits of the procedure and the sedation options and                         risks were discussed with the patient. All questions                         were answered and informed consent was obtained.                         Patient identification and proposed procedure were                         verified prior to the procedure by the physician in                         the procedure room. Mental Status Examination: alert                         and oriented. Airway Examination: normal oropharyngeal                         airway. Respiratory Examination: poor air movement. CV                         Examination: normal. ASA Grade Assessment: III - A                         patient with severe systemic disease. After reviewing  the risks and benefits, the patient was deemed in                         satisfactory condition to undergo the procedure. The                         anesthesia plan was to use moderate sedation /                         analgesia  (conscious sedation). Immediately prior to                         administration of medications, the patient was                         re-assessed for adequacy to receive sedatives. The                         heart rate, respiratory rate, oxygen saturations,                         blood pressure, adequacy of pulmonary ventilation, and                         response to care were monitored throughout the                         procedure. The physical status of the patient was                         re-assessed after the procedure.                        After obtaining informed consent, the bronchoscope was                         passed under direct vision. Throughout the procedure,                         the patient's blood pressure, pulse, and oxygen                         saturations were monitored continuously. the BF-H190                         ([id number]) Olympus Diagnostic Bronchoscope was                         introduced through the right nostril and advanced to                         the tracheobronchial tree. The patient tolerated the                         procedure. The total duration of the procedure was 11                         minutes. Scope In: 4:37:32 PM Scope Out: 4:48:58 PM Findings:  The nasopharynx/oropharynx appears normal. The larynx appears normal.       The vocal cords appear normal. The subglottic space is normal. The       trachea is of normal caliber. The carina is sharp. The tracheobronchial       tree was examined to at least the first subsegmental level. Bronchial       anatomy are normal; there are no endobronchial lesions, and no       secretions. Mucosa friable and easily bleeds with contact with scope      Bronchoalveolar lavage was performed in the right middle lobe of the       lung and sent for cell count, bacterial culture, viral smears & culture,       and fungal & AFB analysis and cytology and flow cytometry. 120 mL of        fluid were instilled. 37 mL were returned. The return was cloudy. There       were no mucoid plugs in the return fluid. [Multi samples]. Impression:            - Interstitial lung disease                        - Anatomy normal                        - Mucosa with erythma and easily irritated                        - Bronchoalveolar lavage was performed. Moderate Sedation:      The administration of moderate sedation was initiated at 16:37 PM. Recommendation:        - Await BAL results. Procedure Code(s):     --- Professional ---                        [id number], Bronchoscopy, rigid or flexible, including                         fluoroscopic guidance, when performed; with bronchial                         alveolar lavage Diagnosis Code(s):     --- Professional ---                        J84.9, Interstitial pulmonary disease, unspecified CPT copyright 2019 American Medical Association. All rights reserved. The codes documented in this report are preliminary and upon coder review may  be revised to meet current compliance requirements. Rodman Pickle, MD 02/19/2019 8:08:23 AM This report has been signed electronically. Number of Addenda: 0

## 2019-02-19 NOTE — Progress Notes (Signed)
NAME:  Kurt [name], MRN:  [id number], DOB:  Mar 28, 1950, LOS: 3 ADMISSION DATE:  02/15/2019, CONSULTATION DATE:  02/16/2019 REFERRING MD:  Danielle Rankin, CHIEF COMPLAINT: Recurrent  Pneumonia   Brief History   Kurt [name] is a 69 y.o. male with medical history significant of chronic diastolic congestive heart failure, hypertension, CKD stage III, recent COVID pneumonia (12/6-12/16/ 2020) and recurrent pneumonia requiring admission 12/28-01/23/2019, presenting again to the ED on 02/15/2019  for evaluation of shortness of breath. He has had CXR in ED which was + for MF pneumonia. CT angiogram chest negative for PE. Showed  worsening extensive bilateral groundglass airspace opacities concerning for multifocal pneumonia.  18 mm nodule in the right middle lobe, nodule was 21 mm on prior study and partially cavitary.The pulmonary service  has been asked to consult for recurrent pneumonia post COVID.  History of present illness   Kurt [name] is a 69 y.o. male never smoker with medical history significant of chronic diastolic congestive heart failure, hypertension, CKD stage III, recent COVID pneumonia presenting to the ED on 1/26  for evaluation of shortness of breath and increasing oxygen needs.  Patient states he was diagnosed with COVID-19 initially around Thanksgiving time last year.  Since then he has been admitted twice.  He was discharged home on 2 L supplemental oxygen but continues to feel very dyspneic even with minimal ambulation.  His oxygen requirement has gone up to 6 L.  Now even on 6 L supplemental oxygen, his oxygen saturation drops to the 40s with minimal ambulation at home.  He is feeling very weak and fell a few days ago.  It took him 45 minutes to get up from the floor.  No loss of consciousness or head injury.  His left hip was slightly sore at the time he fell but the pain has now resolved and he is able to walk without any difficulty.  His temperature has been in the 98 to 44F range at home.   States he has never had lung problems previously and is a never smoker.  He has been taking steroids for several weeks now with no improvement in his breathing.  Denies hemoptysis.  He also recently finished a 3-week course of Augmentin.  Reports having green-colored diarrhea and intermittent abdominal cramps for the past 1 week.  Denies nausea or vomiting.  Denies melena or hematochezia.  He is not on any blood thinners and does not use any over-the-counter NSAIDs.  Patient was admitted to the hospital from 12/6-12/16 for acute hypoxic respiratory failure secondary to COVID-19 pneumonia.  He was treated with steroids and remdesivir.  Discharged home on 2 L supplemental oxygen.  Admitted again on 12/28-1/2 for worsening dyspnea and hypoxia.  CT angiogram done during this hospitalization negative for PE but did show 2 cavitary nodules in both upper lobes measuring up to 2.5 cm concerning for bacterial or fungal pneumonia, septic pulmonary emboli thought to be less likely.  His presentation was thought to be due to respiratory sequela from Covid and chronic sinusitis infection.  Treated with Augmentin x 3 weeks for chronic sinusitis and a slow steroid taper for sequela of Covid pneumonia.  ED Course: Febrile with T-max 100.5 F.  Slightly tachycardic.  Tachypneic with respiratory rate up to 30s.  Not hypotensive.  Oxygen saturation in the low 90s on 6 L supplemental oxygen.  WBC count 11.3.  Lactic acid level normal.  Procalcitonin level <0.10.  High-sensitivity troponin x2 negative.  BNP pending.  MRSA PCR screen pending.  Sputum Gram stain with abundant gram-positive cocci, moderate gram-negative cocci bacilli, and few gram-negative rods.  Sputum culture pending.  UA and urine culture pending.  Blood culture x 2 pending, C diff pending. CRP 11.9, D-dimer 1.10, LDH 248, Flu A& B negative, WBC is 11.3, Fibrinogen 671, Ferritin 131, QuantiFERON TB is pending Chest x-ray personally reviewed showing diffuse  interstitial prominence and patchy bilateral airspace disease similar to prior study, findings concerning for multifocal pneumonia. CT angiogram chest negative for PE.  Showing worsening extensive bilateral groundglass airspace opacities concerning for multifocal pneumonia.  18 mm nodule in the right middle lobe, nodule was 21 mm on prior study and partially cavitary.  Mildly enlarged right paratracheal lymph nodes, stable. ED provider discussed the case with infectious disease, will consult in a.m. Recommended treating with cefepime and linezolid for HCAP. Patient received Tylenol, Percocet, cefepime, linezolid, and a 500 cc normal saline bolus.    Past Medical History    Past Medical History:  Diagnosis Date  . Arthritis   . CHF (congestive heart failure) (HCC)    diastolic dysfunction  . Chronic back pain   . COVID-19   . Depression   . Diverticulitis   . GI bleed 05/2018  . Hypertension   . PTSD (post-traumatic stress disorder)    after  a Fall   . Renal insufficiency    Decrease in GFR- PCP removed all NSAIDS & aspirin     Significant Hospital Events   November 2020>>COVID +  Admission 12/6-12/16/ 2020  Admission 12/28-01/23/2019 Admission 02/16/2019>>  Consults:  02/16/2019>> Pulmonary 02/16/2019>> ID  Procedures:    Significant Diagnostic Tests:  02/16/2019>> CTA Worsening extensive bilateral ground-glass airspace opacities most compatible with pneumonia. 18 mm nodule in the right middle lobe. This nodule was 21 mm on prior study and partially cavitary. Mildly enlarged right paratracheal lymph nodes, stable. No evidence of pulmonary embolus. Cardiomegaly.  02/15/2019 CXR Diffuse interstitial prominence and patchy bilateral airspace disease, similar to prior study. Findings concerning for multifocal pneumonia  Micro Data:  02/16/2019 Sputum: GS: abundant gram-positive cocci, moderate gram-negative cocci bacilli, and few gram-negative rods.   Culture:>>pending 1/26 COVID>> Negative 1/26 Respiratory Viral Panel >> Negative  Antimicrobials:  Maxipime 02/15/2019>>> Linezolid 02/15/2019>>   Interim history/subjective:  Patient reports frustration about severe dyspnea on exertion and inability to ambulate within the room or at home without desaturations. At rest and speech his   Objective   Blood pressure 137/72, pulse 68, temperature 98.3 F (36.8 C), temperature source Oral, resp. rate 16, height 5' 10.5" (1.791 m), weight 122.2 kg, SpO2 98 %.        Intake/Output Summary (Last 24 hours) at 02/19/2019 1728 Last data filed at 02/19/2019 1600 Gross per 24 hour  Intake 1037.04 ml  Output 765 ml  Net 272.04 ml   Filed Weights   02/16/19 1422 02/17/19 0430 02/19/19 0514  Weight: 121.5 kg 121.7 kg 122.2 kg   Physical Exam: General: Obese, well-appearing, no acute distress HENT: [place], AT, OP clear, MMM Eyes: EOMI, no scleral icterus Respiratory: Diminished breath sounds bilaterally. No crackles, wheezing or rales Cardiovascular: RRR, -M/R/G, no JVD GI: BS+, soft, nontender Extremities:-Edema,-tenderness Neuro: AAO x4, CNII-XII grossly intact Skin: Intact, no rashes or bruising Psych: Normal mood, normal affect  Resolved Hospital Problem list     Assessment & Plan:   Acute on chronic hypoxemic respiratory failure. Likely, multifactorial. In the acute setting, agree with initially treating for infection with broad-spectrum antibiotics. His  hypoxemia is worsened by the fact that he likely has undiagnosed/untreated sleep apnea, post-COVID pneumonitis and pulmonary hypertension (likely secondary). I am also concerned about his evolving chest image changes which can represent acute infection but could be developing diffuse parenchymal lung process including acute interstitial pneumonia or organizing pneumonia or hypersensitivity pneumonitis. Deconditioning from his recent hospitalizations also plays a significant role. --BAL  1/28 with mixed cellular pattern concerning for possible pneumonitis, organizing pneumonia or early ILD. Unable to obtain CD4/CD8 due to incorrect order. --Start steroids 60 mg daily with plan for taper as an outpatient --Wean supplemental O2 for goal 88-92%. May need increased O2 with exertion but will need to wean once patient at rest. --Agree with discontinuing antibiotics per ID --Follow-up sputum, AFB and fungal cultures --Scheduled bronchodilators --Recommend PT and OOB as tolerated --Nightly CPAP  RML cavitary lung lesion. Minimally improved after prolonged antibiotic course. Clinically presentation does not appear infectious. Quantiferon gold negative --Antibiotics stopped by ID --Follow-up BAL AFB and fungal culture  Presumed OSA. No formal sleep study completed. Patient has obesity, witnessed apneas, snoring and excessive daytime sleepiness and is currently being treated for hypertension. STOP-BANG score =4, high risk for OSA. --Split night study ordered. To be completed as an outpatient --While inpatient, CPAP nightly    Best practice:  Diet: Heart Healthy Pain/Anxiety/Delirium protocol (if indicated): NA VAP protocol (if indicated): NA DVT prophylaxis: Lovenox GI prophylaxis: Per primary team Glucose control: SSI/ CBG per primary team Mobility:Ambulate in room  Code Status: Full Family Communication: Updated patient at bedside Disposition: Progressive unit  Labs   CBC: Recent Labs  Lab 02/15/19 2213 02/17/19 0355 02/19/19 0506  WBC 11.3* 11.1* 9.2  NEUTROABS 9.0* 9.4*  --   HGB 11.5* 10.5* 11.3*  HCT 36.5* 34.2* 36.7*  MCV 88.0 87.9 87.2  PLT 382 395 474*    Basic Metabolic Panel: Recent Labs  Lab 02/15/19 2304 02/17/19 0355 02/19/19 0506  NA 140 137 136  K 4.5 4.2 4.4  CL 108 104 102  CO2 _0 GLUCOSE 119* 111* 86  BUN _1 CREATININE 0.93 0.96 0.89  CALCIUM 8.6* 8.4* 8.4*  MG 2.1  --   --    GFR: Estimated Creatinine Clearance: 102  mL/min (by C-G formula based on SCr of 0.89 mg/dL). Recent Labs  Lab 02/15/19 2213 02/16/19 0038 02/16/19 0136 02/17/19 0355 02/19/19 0506  PROCALCITON  --   --  <0.10  --   --   WBC 11.3*  --   --  11.1* 9.2  LATICACIDVEN  --  0.7  --   --   --     Liver Function Tests: Recent Labs  Lab 02/15/19 2304  AST 25  ALT 26  ALKPHOS 67  BILITOT 0.7  PROT 6.2*  ALBUMIN 2.6*   No results for input(s): LIPASE, AMYLASE in the last 168 hours. No results for input(s): AMMONIA in the last 168 hours.  ABG    Component Value Date/Time   TCO2 29 07/08/2012 1416     Coagulation Profile: Recent Labs  Lab 02/16/19 0130  INR 1.2    Cardiac Enzymes: No results for input(s): CKTOTAL, CKMB, CKMBINDEX, TROPONINI in the last 168 hours.  HbA1C: Hgb A1c MFr Bld  Date/Time Value Ref Range Status  06/15/2018 09:00 AM 5.9 (H) 4.8 - 5.6 % Final    Comment:    (NOTE)         Prediabetes: 5.7 - 6.4  Diabetes: >6.4         Glycemic control for adults with diabetes: <7.0   07/09/2012 05:40 AM 5.5 <5.7 % Final    Comment:    (NOTE)                                                                       According to the ADA Clinical Practice Recommendations for 2011, when HbA1c is used as a screening test:  >=6.5%   Diagnostic of Diabetes Mellitus           (if abnormal result is confirmed) 5.7-6.4%   Increased risk of developing Diabetes Mellitus References:Diagnosis and Classification of Diabetes Mellitus,Diabetes YPPJ,0932,67(TIWPY 1):S62-S69 and Standards of Medical Care in         Diabetes - 2011,Diabetes KDXI,3382,50 (Suppl 1):S11-S61.    CBG: No results for input(s): GLUCAP in the last 168 hours.     I have spent a total time of 46-minutes reviewing prior documentation, coordinating care and discussing medical diagnosis and plan with the patient/family. Imaging, labs and tests included in this note have been reviewed and interpreted independently by me.  Rodman Pickle,  M.D. [facility] Pulmonary/Critical Care Medicine 02/19/2019 5:29 PM

## 2019-02-19 NOTE — Progress Notes (Signed)
Patient planned for discharge with CPAP. Discussed CPAP settings with Mable Fill, Nurse Case Manager. Pulmonary will order split night study. Patient has been seen face-to-face in hospital and is also scheduled for Pulmonary clinic visit on 03/01/19.  Plan Order AutoCPAP 5-15 with ramp and humidification. Order split night  Pulmonary follow-up arranged  Mechele Collin, M.D. [facility] Pulmonary/Critical Care Medicine 02/19/2019 12:07 PM

## 2019-02-19 NOTE — Telephone Encounter (Signed)
Called and spoke with Rosey Bath at [facility]. Pt is currently in the hospital but is to be discharged today. Rosey Bath said prior to pt being discharged, an order needs to be placed for cpap and settings and also we need to order a sleep study.  Beth, please advise what auto setting range we might be able to use as pt has not had a sleep study performed yet. Rosey Bath with [facility] said all this needs to be ordered before pt is discharged from the hospital today.

## 2019-02-19 NOTE — Progress Notes (Signed)
Rehab Admissions Coordinator Note:  Per PT/OT request, patient was re-screened by Stephania Fragmin for appropriateness for an Inpatient Acute Rehab Consult.  Although pt continues to perform all mobility at a high level and documentation indicate d/c today, note he still requires HFNC and demonstrates significant debility compared to baseline.  At this time, we are recommending Inpatient Rehab consult.  I will place an order so we can evaluate the patient if he remains in house.   Stephania Fragmin 02/19/2019, 3:23 PM  I can be reached at [phone].

## 2019-02-19 NOTE — Progress Notes (Signed)
PROGRESS NOTE    Kurt [name]  SVX:[id number] DOB: Sep 13, 1950 DOA: 02/15/2019 PCP: Leonard Downing, MD   Brief Narrative: 69 year old with past medical history significant for chronic diastolic heart failure, hypertension, CKD stage III, recent Covid pneumonia, diagnosed during Thanksgiving.  Since Covid diagnosis patient has been admitted twice, treated for pneumonia.  His oxygen requirement has increased to 6 L of oxygen from 2 L on discharge.  He reports shortness of breath on exertion, increased oxygen requirement on exertion. Note patient was admitted from 12/6 1-12/16 for acute hypoxic respiratory failure secondary to COVID-19 pneumonia.  He was treated with remdesivir and steroids.  Readmitted 12/28 on ~1/2 for worsening dyspnea and hypoxemia treated for pneumonia at that time with Augmentin for 3 weeks. Presented with worsening shortness of breath, fever, CT angio negative for PE but showed bilateral groundglass opacity.  Assessment & Plan:   Principal Problem:   Cavitary pneumonia Active Problems:   Hypertension   History of COVID-19   Acute respiratory failure with hypoxia (HCC)   CHF (congestive heart failure) (HCC)   Acute on chronic respiratory failure (HCC)   Multifocal pneumonia   Diarrhea   Respiratory failure, acute (HCC)  1-Acute hypoxic Respiratory Failure; secondary to pneumonia -Ct angio; worsening bilateral ground glass opacities most compatible with PNA.18 mm nodule right middle lobe. Negative for PE>  -Sputum culture: Abundant gram-positive cocci, moderate gram-negative coccobacilli, few gram-negative rods and gram-positive rods results pending.  Culture regrowth -Treated initially  with cefepime and linezolid. -SARS coronavirus 2 - -ID has been consulted. Recommend to continue with cefepime, linezolid stopped, needs to check Fungus AFB BAL.  -Respiratory  panel negative. -Stopped  Dexamethasone as recommend by Pulmonologist  -QuantiFERON-TB gold  negative.  -Follow blood cultures: 12 today. -plan for Bronchoscopy 1-28 -no evidence of aspiration on swallow eval.  -BAL; culture pending, AFB pending. Fungus pending.  -Cefepime stop by ID.  There was some confusion, we were not suppose to discharge patient today. We were trying to arrange his CPAP for when he is discharge.  Patient still desats with minimal exertion.  Will ask CIR to evaluate.   2-Diarrhea: GI pathogen and C. difficile order. Resolved.   3-OSA/OHS undiagnosed: He will need a sleep study. Started  CPAP/  Appreciate Brenda arranging CPAP.   Hypertension: Systolic blood pressure normal range. Resume Norvasc and hold lisinopril.   Chronic diastolic congestive heart failure: Does not appear volume overload BNP normal at 47.  Troponins negative Resume lasix,.   CKD stage A; cr stable.   Estimated body mass index is 38.11 kg/m as calculated from the following:   Height as of this encounter: 5' 10.5" (1.791 m).   Weight as of this encounter: 122.2 kg.   DVT prophylaxis: Lovenox Code Status: Full code Family Communication: Care discussed with patient Disposition Plan: Patient from home, remain in the hospital for treatment of acute on chronic hypoxic respiratory failure, recurrent pneumonia, increased oxygen requirement, treating with IV antibiotics, Plan for bronchoscopy on 1/28. -PT recommends CIR. Awaiting evaluation.  -Awaiting culture results.   Consultants:   Pulmonologist  ID  Procedures:   None  Antimicrobials:  Cefepime Linezolid  Subjective: He denies worsening dyspnea. Oxygen sat drops with exertion.    Objective: Vitals:   02/19/19 0514 02/19/19 0910 02/19/19 0912 02/19/19 1355  BP: (!) 142/99 124/70 124/70 137/72  Pulse: 68     Resp:    16  Temp: 98 F (36.7 C)   98.3 F (36.8 C)  TempSrc: Oral   Oral  SpO2: 98% 98%  98%  Weight: 122.2 kg     Height:        Intake/Output Summary (Last 24 hours) at 02/19/2019 1605 Last  data filed at 02/19/2019 1600 Gross per 24 hour  Intake 1037.04 ml  Output 765 ml  Net 272.04 ml   Filed Weights   02/16/19 1422 02/17/19 0430 02/19/19 0514  Weight: 121.5 kg 121.7 kg 122.2 kg    Examination:  General exam: NAD Respiratory system: BL Crackles.  Cardiovascular system; S 1. S 2 RRR Gastrointestinal system: BS present, soft, nt Central nervous system: alert, follows command Extremities: no edema Skin: no rashes.    Data Reviewed: I have personally reviewed following labs and imaging studies  CBC: Recent Labs  Lab 02/15/19 2213 02/17/19 0355 02/19/19 0506  WBC 11.3* 11.1* 9.2  NEUTROABS 9.0* 9.4*  --   HGB 11.5* 10.5* 11.3*  HCT 36.5* 34.2* 36.7*  MCV 88.0 87.9 87.2  PLT 382 395 267*   Basic Metabolic Panel: Recent Labs  Lab 02/15/19 2304 02/17/19 0355 02/19/19 0506  NA 140 137 136  K 4.5 4.2 4.4  CL 108 104 102  CO2 _0 GLUCOSE 119* 111* 86  BUN _1 CREATININE 0.93 0.96 0.89  CALCIUM 8.6* 8.4* 8.4*  MG 2.1  --   --    GFR: Estimated Creatinine Clearance: 102 mL/min (by C-G formula based on SCr of 0.89 mg/dL). Liver Function Tests: Recent Labs  Lab 02/15/19 2304  AST 25  ALT 26  ALKPHOS 67  BILITOT 0.7  PROT 6.2*  ALBUMIN 2.6*   No results for input(s): LIPASE, AMYLASE in the last 168 hours. No results for input(s): AMMONIA in the last 168 hours. Coagulation Profile: Recent Labs  Lab 02/16/19 0130  INR 1.2   Cardiac Enzymes: No results for input(s): CKTOTAL, CKMB, CKMBINDEX, TROPONINI in the last 168 hours. BNP (last 3 results) No results for input(s): PROBNP in the last 8760 hours. HbA1C: No results for input(s): HGBA1C in the last 72 hours. CBG: No results for input(s): GLUCAP in the last 168 hours. Lipid Profile: No results for input(s): CHOL, HDL, LDLCALC, TRIG, CHOLHDL, LDLDIRECT in the last 72 hours. Thyroid Function Tests: No results for input(s): TSH, T4TOTAL, FREET4, T3FREE, THYROIDAB in the last 72  hours. Anemia Panel: No results for input(s): VITAMINB12, FOLATE, FERRITIN, TIBC, IRON, RETICCTPCT in the last 72 hours. Sepsis Labs: Recent Labs  Lab 02/16/19 0038 02/16/19 0136  PROCALCITON  --  <0.10  LATICACIDVEN 0.7  --     Recent Results (from the past 240 hour(s))  Blood Culture (routine x 2)     Status: None (Preliminary result)   Collection Time: 02/15/19 11:04 PM   Specimen: BLOOD RIGHT HAND  Result Value Ref Range Status   Specimen Description BLOOD RIGHT HAND  Final   Special Requests   Final    BOTTLES DRAWN AEROBIC AND ANAEROBIC Blood Culture results may not be optimal due to an inadequate volume of blood received in culture bottles   Culture   Final    NO GROWTH 4 DAYS Performed at [facility], [place] [address] [place], [place] [address]    Report Status PENDING  Incomplete  Expectorated sputum assessment w rflx to resp cult     Status: None (Preliminary result)   Collection Time: 02/16/19 12:02 AM   Specimen: Sputum  Result Value Ref Range Status   Specimen Description  SPUTUM  Final   Special Requests NONE  Final   Sputum evaluation   Final    THIS SPECIMEN IS ACCEPTABLE FOR SPUTUM CULTURE Performed at [facility], [address], [place], [place] [address]    Report Status PENDING  Incomplete  Culture, respiratory     Status: None (Preliminary result)   Collection Time: 02/16/19 12:02 AM   Specimen: SPU  Result Value Ref Range Status   Specimen Description SPUTUM  Final   Special Requests NONE Reflexed from [id number]  Final   Gram Stain   Final    MODERATE WBC PRESENT, PREDOMINANTLY PMN FEW SQUAMOUS EPITHELIAL CELLS PRESENT ABUNDANT GRAM POSITIVE COCCI MODERATE GRAM NEGATIVE COCCOBACILLI FEW GRAM NEGATIVE RODS FEW GRAM POSITIVE RODS    Culture   Final    FEW GRAM NEGATIVE RODS CULTURE REINCUBATED FOR BETTER GROWTH Performed at [facility], [place] [address], [place], [place] [address]    Report Status PENDING  Incomplete  Blood  Culture (routine x 2)     Status: None (Preliminary result)   Collection Time: 02/16/19  1:30 AM   Specimen: BLOOD LEFT ARM  Result Value Ref Range Status   Specimen Description BLOOD LEFT ARM  Final   Special Requests   Final    BOTTLES DRAWN AEROBIC AND ANAEROBIC Blood Culture adequate volume   Culture   Final    NO GROWTH 3 DAYS Performed at [facility], [place] [address], [place], [place] [address]    Report Status PENDING  Incomplete  Urine culture     Status: None   Collection Time: 02/16/19  3:05 AM   Specimen: In/Out Cath Urine  Result Value Ref Range Status   Specimen Description IN/OUT CATH URINE  Final   Special Requests NONE  Final   Culture   Final    NO GROWTH Performed at [facility], [place] [address] [place], [place] [address]    Report Status 02/16/2019 FINAL  Final  SARS CORONAVIRUS 2 (TAT 6-24 HRS) Nasopharyngeal Nasal Mucosa     Status: None   Collection Time: 02/16/19  3:30 AM   Specimen: Nasal Mucosa; Nasopharyngeal  Result Value Ref Range Status   SARS Coronavirus 2 NEGATIVE NEGATIVE Final    Comment: (NOTE) SARS-CoV-2 target nucleic acids are NOT DETECTED. The SARS-CoV-2 RNA is generally detectable in upper and lower respiratory specimens during the acute phase of infection. Negative results do not preclude SARS-CoV-2 infection, do not rule out co-infections with other pathogens, and should not be used as the sole basis for treatment or other patient management decisions. Negative results must be combined with clinical observations, patient history, and epidemiological information. The expected result is Negative. Fact Sheet for Patients: SugarRoll.be Fact Sheet for Healthcare Providers: https://www.woods-mathews.com/ This test is not yet approved or cleared by the [place] FDA and  has been authorized for detection and/or diagnosis of SARS-CoV-2 by FDA under an Emergency Use Authorization  (EUA). This EUA will remain  in effect (meaning this test can be used) for the duration of the COVID-19 declaration under Section 56 4(b)(1) of the Act, 21 U.S.C. section 360bbb-3(b)(1), unless the authorization is terminated or revoked sooner. Performed at [facility], [place] [address] [place], [place] [address]   Respiratory Panel by PCR     Status: None   Collection Time: 02/16/19  4:05 AM   Specimen: Flu Kit Nasopharyngeal Swab; Respiratory  Result Value Ref Range Status   Adenovirus  NOT DETECTED NOT DETECTED Final   Coronavirus 229E NOT DETECTED NOT DETECTED Final    Comment: (NOTE) The Coronavirus on the Respiratory Panel, DOES NOT test for the novel  Coronavirus (2019 nCoV)    Coronavirus HKU1 NOT DETECTED NOT DETECTED Final   Coronavirus NL63 NOT DETECTED NOT DETECTED Final   Coronavirus OC43 NOT DETECTED NOT DETECTED Final   Metapneumovirus NOT DETECTED NOT DETECTED Final   Rhinovirus / Enterovirus NOT DETECTED NOT DETECTED Final   Influenza A NOT DETECTED NOT DETECTED Final   Influenza B NOT DETECTED NOT DETECTED Final   Parainfluenza Virus 1 NOT DETECTED NOT DETECTED Final   Parainfluenza Virus 2 NOT DETECTED NOT DETECTED Final   Parainfluenza Virus 3 NOT DETECTED NOT DETECTED Final   Parainfluenza Virus 4 NOT DETECTED NOT DETECTED Final   Respiratory Syncytial Virus NOT DETECTED NOT DETECTED Final   Bordetella pertussis NOT DETECTED NOT DETECTED Final   Chlamydophila pneumoniae NOT DETECTED NOT DETECTED Final   Mycoplasma pneumoniae NOT DETECTED NOT DETECTED Final    Comment: Performed at [facility], [place] [address], [place], [place] [address]  MRSA PCR Screening     Status: None   Collection Time: 02/16/19  5:21 AM   Specimen: Nasal Mucosa; Nasopharyngeal  Result Value Ref Range Status   MRSA by PCR NEGATIVE NEGATIVE Final    Comment:        The GeneXpert MRSA Assay (FDA approved for NASAL specimens only), is one component of a comprehensive MRSA  colonization surveillance program. It is not intended to diagnose MRSA infection nor to guide or monitor treatment for MRSA infections. Performed at [facility], [place] [address], [place], [place] [address]   Culture, bal-quantitative     Status: None (Preliminary result)   Collection Time: 02/18/19  4:55 PM   Specimen: Bronchoalveolar Lavage; Respiratory  Result Value Ref Range Status   Specimen Description BRONCHIAL ALVEOLAR LAVAGE  Final   Special Requests RIGHT MIDDLE LOBE  Final   Gram Stain   Final    FEW WBC PRESENT, PREDOMINANTLY MONONUCLEAR NO ORGANISMS SEEN    Culture   Final    NO GROWTH <12 HOURS Performed at [facility], [address], [place], [place] [address]    Report Status PENDING  Incomplete  Respiratory Panel by PCR     Status: None   Collection Time: 02/18/19  9:00 PM   Specimen: Bronchial Alveolar Lavage; Respiratory  Result Value Ref Range Status   Adenovirus NOT DETECTED NOT DETECTED Final   Coronavirus 229E NOT DETECTED NOT DETECTED Final    Comment: (NOTE) The Coronavirus on the Respiratory Panel, DOES NOT test for the novel  Coronavirus (2019 nCoV)    Coronavirus HKU1 NOT DETECTED NOT DETECTED Final   Coronavirus NL63 NOT DETECTED NOT DETECTED Final   Coronavirus OC43 NOT DETECTED NOT DETECTED Final   Metapneumovirus NOT DETECTED NOT DETECTED Final   Rhinovirus / Enterovirus NOT DETECTED NOT DETECTED Final   Influenza A NOT DETECTED NOT DETECTED Final   Influenza B NOT DETECTED NOT DETECTED Final   Parainfluenza Virus 1 NOT DETECTED NOT DETECTED Final   Parainfluenza Virus 2 NOT DETECTED NOT DETECTED Final   Parainfluenza Virus 3 NOT DETECTED NOT DETECTED Final   Parainfluenza Virus 4 NOT DETECTED NOT DETECTED Final   Respiratory Syncytial Virus NOT DETECTED NOT DETECTED Final   Bordetella pertussis NOT DETECTED NOT DETECTED Final   Chlamydophila pneumoniae NOT DETECTED NOT DETECTED Final  Mycoplasma pneumoniae NOT DETECTED NOT  DETECTED Final    Comment: Performed at [facility], [place] [address] [place], [place] [address]         Radiology Studies: No results found.      Scheduled Meds: . allopurinol  100 mg Oral BID  . amLODipine  2.5 mg Oral Daily  . enoxaparin (LOVENOX) injection  40 mg Subcutaneous Q0600  . ferrous sulfate  325 mg Oral Q breakfast  . mouth rinse  15 mL Mouth Rinse BID  . pantoprazole  40 mg Oral Daily  . sertraline  200 mg Oral q morning - 10a   Continuous Infusions: . sodium chloride 10 mL/hr at 02/16/19 1700     LOS: 3 days    Time spent: 35 minutes.     Elmarie Shiley, MD [facility]   If 7PM-7AM, please contact night-coverage  02/19/2019, 4:05 PM

## 2019-02-19 NOTE — Progress Notes (Signed)
Physical Therapy Treatment Patient Details Name: Kurt [name] MRN: [id number] DOB: [birth year] Today's Date: 02/19/2019    History of Present Illness Pt is 69 y.o. male with medical history significant of chronic diastolic congestive heart failure, hypertension, CKD stage III, recent COVID pneumonia (COVID in 11/2018) presenting to the ED for evaluation of shortness of breath. Pt admitted with resp failure and PNE.  CT Chest negative for PE.  TB results pending.    PT Comments    Patient received up in chair, pleasant but frustrated regarding his current condition. MD arrived in room during session to explain that he is not being discharged today, this was a documentation issue from another service. Able to perform functional transfers with min guard but did require MinA for balance with B HHA and only able to tolerate dynamic activity for 60 seconds or less before having a coughing spell and desat, needing to sit back down. Had him on 6LPM on standard [place] during session to attempt to condense airflow through cannula, he did drop into mid80s but recovered back to 97% with seated rest and pursed lip breathing. Education provided on PT POC moving forward. He was left up in the chair with all needs met, actually on 5LPM per standard [place] and satting at 99%. Will continue to follow acutely and can increase frequency if necessary moving forward.     Follow Up Recommendations  CIR(refusing SNF)     Equipment Recommendations  Other (comment)(defer)    Recommendations for Other Services       Precautions / Restrictions Precautions Precautions: Fall Precaution Comments: monitor O2 Restrictions Weight Bearing Restrictions: No    Mobility  Bed Mobility               General bed mobility comments: OOB in chair  Transfers Overall transfer level: Needs assistance Equipment used: None Transfers: Sit to/from Stand Sit to Stand: Min guard Stand pivot transfers: Min guard       General  transfer comment: min guard for safety  Ambulation/Gait Ambulation/Gait assistance: Min assist Gait Distance (Feet): 4 Feet Assistive device: None Gait Pattern/deviations: Decreased stride length;Wide base of support Gait velocity: decreased   General Gait Details: 16f forward, 229fbackwards in front of chair, limited by dyspnea and coughing spell, HA   Stairs             Wheelchair Mobility    Modified Rankin (Stroke Patients Only)       Balance Overall balance assessment: Needs assistance Sitting-balance support: No upper extremity supported;Feet supported Sitting balance-Leahy Scale: Good     Standing balance support: During functional activity;No upper extremity supported Standing balance-Leahy Scale: Poor Standing balance comment: MinA for balance with dynamic tasks                            Cognition Arousal/Alertness: Awake/alert Behavior During Therapy: WFL for tasks assessed/performed Overall Cognitive Status: Within Functional Limits for tasks assessed                                        Exercises      General Comments General comments (skin integrity, edema, etc.): Pt on 6L supplemental 02.  Sats at rest 97%, but decreased to 91% with activity.  Pt demonstrates independence with pursed lip breathing       Pertinent Vitals/Pain Pain Assessment: No/denies pain  Home Living Family/patient expects to be discharged to:: Private residence Living Arrangements: Alone Available Help at Discharge: Family;Available PRN/intermittently Type of Home: House Home Access: Stairs to enter Entrance Stairs-Rails: Right;Left Home Layout: One level Home Equipment: Clinical cytogeneticist - 4 wheels;Walker - 2 wheels;Cane - single point      Prior Function Level of Independence: Needs assistance  Gait / Transfers Assistance Needed: Reports recent fall by tripping on O2 tubing; Has had a few good days since Novemeber that he was able  to get out but gradually getting weaker and recently only household ambulation with RW - limited by O2 sats ADL's / Homemaking Assistance Needed: Pt reports he has been performing ADLs mod I, but struggles and fatigues quickly.  He reports his 02 saturations decrease with minimal activity.  He has groceries delivered.   He endorses 1 fall in the past several weeks  Comments: Prior to having COVID in November he was very independent.  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   PT Goals (current goals can now be found in the care plan section) Acute Rehab PT Goals Patient Stated Goal: for breathing to get better  PT Goal Formulation: With patient Time For Goal Achievement: 03/04/19 Potential to Achieve Goals: Good Progress towards PT goals: Not progressing toward goals - comment(continues to be limited by breathing)    Frequency    Min 3X/week      PT Plan Current plan remains appropriate    Co-evaluation              AM-PAC PT "6 Clicks" Mobility   Outcome Measure  Help needed turning from your back to your side while in a flat bed without using bedrails?: None Help needed moving from lying on your back to sitting on the side of a flat bed without using bedrails?: A Little Help needed moving to and from a bed to a chair (including a wheelchair)?: None Help needed standing up from a chair using your arms (e.g., wheelchair or bedside chair)?: None Help needed to walk in hospital room?: A Little Help needed climbing 3-5 steps with a railing? : A Lot 6 Click Score: 20    End of Session Equipment Utilized During Treatment: Oxygen Activity Tolerance: Patient limited by fatigue Patient left: in chair;with call bell/phone within reach Nurse Communication: Mobility status PT Visit Diagnosis: History of falling (Z91.81);Muscle weakness (generalized) (M62.81);Unsteadiness on feet (R26.81)     Time: 1240-1303 PT  Time Calculation (min) (ACUTE ONLY): 23 min  Charges:  $Therapeutic Activity: 23-37 mins                     Windell Norfolk, DPT, PN1   Supplemental Physical Therapist [place]    Pager [phone] Acute Rehab Office [phone]

## 2019-02-19 NOTE — Evaluation (Signed)
Occupational Therapy Evaluation Patient Details Name: Kurt [name] MRN: [id number] DOB: Feb 23, 1950 Today's Date: 02/19/2019    History of Present Illness Pt is 69 y.o. male with medical history significant of chronic diastolic congestive heart failure, hypertension, CKD stage III, recent COVID pneumonia (COVID in 11/2018) presenting to the ED for evaluation of shortness of breath. Pt admitted with resp failure and PNE.  CT Chest negative for PE.  TB results pending.   Clinical Impression   Pt admitted with above. He demonstrates the below listed deficits and will benefit from continued OT to maximize safety and independence with BADLs.  Pt currently requires min A for ADLs with multiple rest breaks to allow time to recover before proceeding to next step of ADLs.  He lives alone,  has had several readmissions, and reports difficulty managing himself at home over a 24 hour time frame due to oxygen desaturation and fatiguing.  He has had one recent fall.  Feel he would benefit from post acute rehab to maximize his independence and safety with ADLs and reduce risk of falls and readmission.- he is refusing SNF level rehab.        Follow Up Recommendations  CIR;Supervision - Intermittent    Equipment Recommendations  None recommended by OT    Recommendations for Other Services Rehab consult     Precautions / Restrictions Precautions Precautions: Fall Precaution Comments: monitor O2      Mobility Bed Mobility               General bed mobility comments: up in recliner   Transfers Overall transfer level: Needs assistance Equipment used: None Transfers: Sit to/from Stand;Stand Pivot Transfers Sit to Stand: Min guard Stand pivot transfers: Min guard       General transfer comment: min guard for safety     Balance Overall balance assessment: Needs assistance Sitting-balance support: No upper extremity supported;Feet supported Sitting balance-Leahy Scale: Good      Standing balance support: During functional activity;No upper extremity supported Standing balance-Leahy Scale: Fair Standing balance comment: able to maintain static standing x 6 mins with supervision.                            ADL either performed or assessed with clinical judgement   ADL Overall ADL's : Needs assistance/impaired Eating/Feeding: Independent   Grooming: Wash/dry hands;Wash/dry face;Oral care;Supervision/safety;Standing   Upper Body Bathing: Set up;Sitting   Lower Body Bathing: Sit to/from stand;Minimal assistance   Upper Body Dressing : Set up;Sitting   Lower Body Dressing: Minimal assistance;Sit to/from stand   Toilet Transfer: Min Statistician Details (indicate cue type and reason): unable to ambulate to BR due to 02 desturation with activity  Toileting- Water quality scientist and Hygiene: Min guard;Sit to/from stand       Functional mobility during ADLs: Minimal assistance General ADL Comments: Pt fatigues requiring rest breaks to complete activity      Vision   Vision Assessment?: No apparent visual deficits     Perception     Praxis      Pertinent Vitals/Pain Pain Assessment: No/denies pain     Hand Dominance Right   Extremity/Trunk Assessment Upper Extremity Assessment Upper Extremity Assessment: Generalized weakness   Lower Extremity Assessment Lower Extremity Assessment: Defer to PT evaluation   Cervical / Trunk Assessment Cervical / Trunk Assessment: Normal   Communication Communication Communication: No difficulties   Cognition Arousal/Alertness: Awake/alert Behavior During Therapy: WFL for tasks assessed/performed  Overall Cognitive Status: Within Functional Limits for tasks assessed                                     General Comments  Pt on 6L supplemental 02.  Sats at rest 97%, but decreased to 91% with activity.  Pt demonstrates independence with pursed lip breathing      Exercises     Shoulder Instructions      Home Living Family/patient expects to be discharged to:: Private residence Living Arrangements: Alone Available Help at Discharge: Family;Available PRN/intermittently Type of Home: House Home Access: Stairs to enter [employer] of Steps: 7 with one handrail in midline Entrance Stairs-Rails: Right;Left Home Layout: One level     Bathroom Shower/Tub: Chief Strategy Officer: Standard Bathroom Accessibility: Yes How Accessible: Accessible via walker Home Equipment: Shower seat;Walker - 4 wheels;Walker - 2 wheels;Cane - single point          Prior Functioning/Environment Level of Independence: Needs assistance  Gait / Transfers Assistance Needed: Reports recent fall by tripping on O2 tubing; Has had a few good days since Novemeber that he was able to get out but gradually getting weaker and recently only household ambulation with RW - limited by O2 sats ADL's / Homemaking Assistance Needed: Pt reports he has been performing ADLs mod I, but struggles and fatigues quickly.  He reports his 02 saturations decrease with minimal activity.  He has groceries delivered.   He endorses 1 fall in the past several weeks    Comments: Prior to having COVID in November he was very independent.  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        OT Problem List: Decreased range of motion;Decreased activity tolerance;Impaired balance (sitting and/or standing);Decreased knowledge of use of DME or AE;Cardiopulmonary status limiting activity      OT Treatment/Interventions: Self-care/ADL training;DME and/or AE instruction;Energy conservation;Therapeutic activities;Patient/family education;Balance training    OT Goals(Current goals can be found in the care plan section) Acute Rehab OT Goals Patient Stated Goal: for breathing to get better  OT Goal Formulation: With  patient Time For Goal Achievement: 03/05/19 Potential to Achieve Goals: Good ADL Goals Additional ADL Goal #1: Pt will complete ADL with no more than 3 rest breaks, and sats no less than 88% Additional ADL Goal #2: Pt will be independent with energy conservation techniques  OT Frequency: Min 2X/week   Barriers to D/C: Decreased caregiver support          Co-evaluation              AM-PAC OT "6 Clicks" Daily Activity     Outcome Measure Help from another person eating meals?: None Help from another person taking care of personal grooming?: A Little Help from another person toileting, which includes using toliet, bedpan, or urinal?: A Little Help from another person bathing (including washing, rinsing, drying)?: A Little Help from another person to put on and taking off regular upper body clothing?: A Little Help from another person to put on and taking off regular lower body clothing?: A Little 6 Click Score: 19   End of Session Equipment Utilized During Treatment: Oxygen Nurse Communication: Mobility status  Activity Tolerance: Patient limited by fatigue Patient left: in chair;with call bell/phone within reach  OT Visit Diagnosis: Unsteadiness on feet (R26.81)  Time: 9476-5465 OT Time Calculation (min): 11 min Charges:  OT General Charges $OT Visit: 1 Visit OT Evaluation $OT Eval Moderate Complexity: 1 Mod  Eber Jones., OTR/L Acute Rehabilitation Services Pager [phone] Office [phone]   Jeani Hawking M 02/19/2019, 2:58 PM

## 2019-02-19 NOTE — Telephone Encounter (Signed)
Split night study ordered prior to discharge of patient. Will follow-up in Pulmonary clinic. Inpatient team planning to send patient home with CPAP per my recommended settings with the understanding that insurance has not yet approved and costs will be out-of-pocket.  Mechele Collin, M.D. [facility] Pulmonary/Critical Care Medicine 02/19/2019 12:16 PM

## 2019-02-19 NOTE — Telephone Encounter (Signed)
Checked recent notes and looks like Kandice Robinsons did a consult note on pt and Dr. Everardo All has also seen pt.  Dr. Everardo All or Maralyn Sago, please advise on this.

## 2019-02-19 NOTE — Progress Notes (Signed)
AFB resulted negative. Dr. Everardo All stated in her Airborne order that when AFB resulted negative that airborne precautions could be d/c'd. Airborne prec removed. Emelda Brothers RN

## 2019-02-20 LAB — BASIC METABOLIC PANEL
Anion gap: 10 (ref 5–15)
BUN: 16 mg/dL (ref 8–23)
CO2: 26 mmol/L (ref 22–32)
Calcium: 8.7 mg/dL — ABNORMAL LOW (ref 8.9–10.3)
Chloride: 101 mmol/L (ref 98–111)
Creatinine, Ser: 0.99 mg/dL (ref 0.61–1.24)
GFR calc Af Amer: >60 mL/min (ref >60)
GFR calc non Af Amer: >60 mL/min (ref >60)
Glucose, Bld: 161 mg/dL — ABNORMAL HIGH (ref 70–99)
Potassium: 4.9 mmol/L (ref 3.5–5.1)
Sodium: 137 mmol/L (ref 135–145)

## 2019-02-20 LAB — CBC
HCT: 36.3 % — ABNORMAL LOW (ref 39.0–52.0)
Hemoglobin: 11.4 g/dL — ABNORMAL LOW (ref 13.0–17.0)
MCH: 27.2 pg (ref 26.0–34.0)
MCHC: 31.4 g/dL (ref 30.0–36.0)
MCV: 86.6 fL (ref 80.0–100.0)
Platelets: 500 10*3/uL — ABNORMAL HIGH (ref 150–400)
RBC: 4.19 MIL/uL — ABNORMAL LOW (ref 4.22–5.81)
RDW: 14.4 % (ref 11.5–15.5)
WBC: 8.1 10*3/uL (ref 4.0–10.5)
nRBC: 0 % (ref 0.0–0.2)

## 2019-02-20 LAB — EXPECTORATED SPUTUM ASSESSMENT W GRAM STAIN, RFLX TO RESP C

## 2019-02-20 LAB — CULTURE, BLOOD (ROUTINE X 2): Culture: NO GROWTH

## 2019-02-20 MED ORDER — SODIUM CHLORIDE 0.9 % IV SOLN
2.0000 g | Freq: Three times a day (TID) | INTRAVENOUS | Status: DC
  Administered 2019-02-20 – 2019-02-22 (×5): 2 g via INTRAVENOUS
  Filled 2019-02-20 (×7): qty 2

## 2019-02-20 MED ORDER — FUROSEMIDE 40 MG PO TABS
40.0000 mg | ORAL_TABLET | Freq: Every day | ORAL | Status: DC
  Administered 2019-02-20 – 2019-02-23 (×4): 40 mg via ORAL
  Filled 2019-02-20 (×4): qty 1

## 2019-02-20 MED ORDER — MENTHOL 3 MG MT LOZG
1.0000 | LOZENGE | OROMUCOSAL | Status: DC | PRN
  Administered 2019-02-20 – 2019-02-23 (×3): 3 mg via ORAL
  Filled 2019-02-20 (×2): qty 9

## 2019-02-20 MED ORDER — FLUCONAZOLE 100 MG PO TABS
100.0000 mg | ORAL_TABLET | Freq: Every day | ORAL | Status: DC
  Administered 2019-02-20 – 2019-02-21 (×2): 100 mg via ORAL
  Filled 2019-02-20 (×3): qty 1

## 2019-02-20 MED ORDER — NYSTATIN 100000 UNIT/ML MT SUSP
5.0000 mL | Freq: Four times a day (QID) | OROMUCOSAL | Status: DC
  Administered 2019-02-20 – 2019-02-21 (×4): 500000 [IU] via ORAL
  Filled 2019-02-20 (×4): qty 5

## 2019-02-20 NOTE — Progress Notes (Addendum)
PROGRESS NOTE    Kurt [name]  OEV:[id number] DOB: [birth year] DOA: 02/15/2019 PCP: Leonard Downing, MD   Brief Narrative: 69 year old with past medical history significant for chronic diastolic heart failure, hypertension, CKD stage III, recent Covid pneumonia, diagnosed during Thanksgiving.  Since Covid diagnosis patient has been admitted twice, treated for pneumonia.  His oxygen requirement has increased to 6 L of oxygen from 2 L on discharge.  He reports shortness of breath on exertion, increased oxygen requirement on exertion. Note patient was admitted from 12/6 1-12/16 for acute hypoxic respiratory failure secondary to COVID-19 pneumonia.  He was treated with remdesivir and steroids.  Readmitted 12/28 on ~1/2 for worsening dyspnea and hypoxemia treated for pneumonia at that time with Augmentin for 3 weeks. Presented with worsening shortness of breath, fever, CT angio negative for PE but showed bilateral groundglass opacity.  Assessment & Plan:   Principal Problem:   Cavitary pneumonia Active Problems:   Hypertension   History of COVID-19   Acute respiratory failure with hypoxia (HCC)   CHF (congestive heart failure) (HCC)   Acute on chronic respiratory failure (HCC)   Multifocal pneumonia   Diarrhea   Respiratory failure, acute (HCC)  1-Acute hypoxic Respiratory Failure; secondary to pneumonia -Ct angio; worsening bilateral ground glass opacities most compatible with PNA.18 mm nodule right middle lobe. Negative for PE>  -Treated initially  with cefepime and linezolid. Subsequently linezolid stopped.  -SARS coronavirus 2 - -Respiratory  panel negative. -QuantiFERON-TB gold negative.  -Follow blood cultures: No growth to date.  -Underwent  Bronchoscopy 1-28 -No evidence of aspiration on swallow eval.  -BAL; culture pending, AFB smear negative, AFB culture pending.  culture no growth 12 hours. , . Fungus pending.  -Cefepime stop by ID 1-29.  Patient still desats with  minimal exertion.  Will ask CIR to evaluate.  Sputum culture from 1-26 grew few Gram negative rods; culture re incubated. Resume IV cefepime.  On prednisone 60 mg daily, will need long taper tx.  On Schedule nebulizer.   2-Diarrhea: GI pathogen and C. difficile order. Resolved.   3-OSA/OHS undiagnosed: He will need a sleep study. Started  CPAP/  Appreciate Brenda arranging CPAP.   Hypertension: Systolic blood pressure normal range. Resume Norvasc and hold lisinopril.   Chronic diastolic congestive heart failure: Does not appear volume overload BNP normal at 47.  Troponins negative Resume lasix,.   CKD stage A; cr stable.  Sore throat; white plaque, check strep culture.  Start nystatin and fluconazole.   Estimated body mass index is 37.49 kg/m as calculated from the following:   Height as of this encounter: 5' 10.5" (1.791 m).   Weight as of this encounter: 120.2 kg.   DVT prophylaxis: Lovenox Code Status: Full code Family Communication: Care discussed with patient Disposition Plan:  Patient from home, remain in the hospital for treatment of acute on chronic hypoxic respiratory failure, recurrent pneumonia, increased oxygen requirement. Still desat on minimal exertion. BAck on IV cefepime.  -PT recommends CIR. Awaiting evaluation.  -Awaiting culture results.   Consultants:   Pulmonologist  ID  Procedures:   None  Antimicrobials:  Cefepime Linezolid  Subjective: He is doing ok, his bigger problem is desat on minimal exertion.  He was able to sleep better last night with CPAP machine.  He is complaining of sore throat   Objective: Vitals:   02/20/19 0839 02/20/19 1435 02/20/19 1557 02/20/19 1600  BP: 125/75   129/74  Pulse:      Resp:  20  Temp:   98.8 F (37.1 C)   TempSrc:   Oral   SpO2:  98%  97%  Weight:      Height:        Intake/Output Summary (Last 24 hours) at 02/20/2019 1659 Last data filed at 02/20/2019 1500 Gross per 24 hour  Intake  1010 ml  Output 1080 ml  Net -70 ml   Filed Weights   02/17/19 0430 02/19/19 0514 02/20/19 0502  Weight: 121.7 kg 122.2 kg 120.2 kg    Examination:  General exam: NAD Respiratory system: No wheezing.  Cardiovascular system; S 1, S 2 RRR Gastrointestinal system: BS present, soft, nt Central nervous system: Alert, follows command Extremities: no edema Skin: no rashes.    Data Reviewed: I have personally reviewed following labs and imaging studies  CBC: Recent Labs  Lab 02/15/19 2213 02/17/19 0355 02/19/19 0506 02/20/19 0428  WBC 11.3* 11.1* 9.2 8.1  NEUTROABS 9.0* 9.4*  --   --   HGB 11.5* 10.5* 11.3* 11.4*  HCT 36.5* 34.2* 36.7* 36.3*  MCV 88.0 87.9 87.2 86.6  PLT 382 395 474* 116*   Basic Metabolic Panel: Recent Labs  Lab 02/15/19 2304 02/17/19 0355 02/19/19 0506 02/20/19 0428  NA 140 137 136 137  K 4.5 4.2 4.4 4.9  CL 108 104 102 101  CO2 _0 GLUCOSE 119* 111* 86 161*  BUN _1 CREATININE 0.93 0.96 0.89 0.99  CALCIUM 8.6* 8.4* 8.4* 8.7*  MG 2.1  --   --   --    GFR: Estimated Creatinine Clearance: 90.9 mL/min (by C-G formula based on SCr of 0.99 mg/dL). Liver Function Tests: Recent Labs  Lab 02/15/19 2304  AST 25  ALT 26  ALKPHOS 67  BILITOT 0.7  PROT 6.2*  ALBUMIN 2.6*   No results for input(s): LIPASE, AMYLASE in the last 168 hours. No results for input(s): AMMONIA in the last 168 hours. Coagulation Profile: Recent Labs  Lab 02/16/19 0130  INR 1.2   Cardiac Enzymes: No results for input(s): CKTOTAL, CKMB, CKMBINDEX, TROPONINI in the last 168 hours. BNP (last 3 results) No results for input(s): PROBNP in the last 8760 hours. HbA1C: No results for input(s): HGBA1C in the last 72 hours. CBG: No results for input(s): GLUCAP in the last 168 hours. Lipid Profile: No results for input(s): CHOL, HDL, LDLCALC, TRIG, CHOLHDL, LDLDIRECT in the last 72 hours. Thyroid Function Tests: No results for input(s): TSH, T4TOTAL,  FREET4, T3FREE, THYROIDAB in the last 72 hours. Anemia Panel: No results for input(s): VITAMINB12, FOLATE, FERRITIN, TIBC, IRON, RETICCTPCT in the last 72 hours. Sepsis Labs: Recent Labs  Lab 02/16/19 0038 02/16/19 0136  PROCALCITON  --  <0.10  LATICACIDVEN 0.7  --     Recent Results (from the past 240 hour(s))  Blood Culture (routine x 2)     Status: None   Collection Time: 02/15/19 11:04 PM   Specimen: BLOOD RIGHT HAND  Result Value Ref Range Status   Specimen Description BLOOD RIGHT HAND  Final   Special Requests   Final    BOTTLES DRAWN AEROBIC AND ANAEROBIC Blood Culture results may not be optimal due to an inadequate volume of blood received in culture bottles   Culture   Final    NO GROWTH 5 DAYS Performed at [facility], [place] [address], [place], [place] [address]    Report Status 02/20/2019 FINAL  Final  Expectorated sputum assessment w rflx  to resp cult     Status: None   Collection Time: 02/16/19 12:02 AM   Specimen: Sputum  Result Value Ref Range Status   Specimen Description SPUTUM  Final   Special Requests NONE  Final   Sputum evaluation   Final    THIS SPECIMEN IS ACCEPTABLE FOR SPUTUM CULTURE Performed at [facility], [address] [place], [place] [address]    Report Status 02/20/2019 FINAL  Final  Culture, respiratory     Status: None (Preliminary result)   Collection Time: 02/16/19 12:02 AM   Specimen: SPU  Result Value Ref Range Status   Specimen Description SPUTUM  Final   Special Requests NONE Reflexed from [id number]  Final   Gram Stain   Final    MODERATE WBC PRESENT, PREDOMINANTLY PMN FEW SQUAMOUS EPITHELIAL CELLS PRESENT ABUNDANT GRAM POSITIVE COCCI MODERATE GRAM NEGATIVE COCCOBACILLI FEW GRAM NEGATIVE RODS FEW GRAM POSITIVE RODS    Culture   Final    FEW GRAM NEGATIVE RODS CULTURE REINCUBATED FOR BETTER GROWTH Performed at [facility], [place] [address] [place], [place] [address]    Report Status PENDING  Incomplete   Blood Culture (routine x 2)     Status: None (Preliminary result)   Collection Time: 02/16/19  1:30 AM   Specimen: BLOOD LEFT ARM  Result Value Ref Range Status   Specimen Description BLOOD LEFT ARM  Final   Special Requests   Final    BOTTLES DRAWN AEROBIC AND ANAEROBIC Blood Culture adequate volume   Culture   Final    NO GROWTH 4 DAYS Performed at [facility], [place] [address] [place], [place] [address]    Report Status PENDING  Incomplete  Urine culture     Status: None   Collection Time: 02/16/19  3:05 AM   Specimen: In/Out Cath Urine  Result Value Ref Range Status   Specimen Description IN/OUT CATH URINE  Final   Special Requests NONE  Final   Culture   Final    NO GROWTH Performed at [facility], [place] [address] [place], [place] [address]    Report Status 02/16/2019 FINAL  Final  SARS CORONAVIRUS 2 (TAT 6-24 HRS) Nasopharyngeal Nasal Mucosa     Status: None   Collection Time: 02/16/19  3:30 AM   Specimen: Nasal Mucosa; Nasopharyngeal  Result Value Ref Range Status   SARS Coronavirus 2 NEGATIVE NEGATIVE Final    Comment: (NOTE) SARS-CoV-2 target nucleic acids are NOT DETECTED. The SARS-CoV-2 RNA is generally detectable in upper and lower respiratory specimens during the acute phase of infection. Negative results do not preclude SARS-CoV-2 infection, do not rule out co-infections with other pathogens, and should not be used as the sole basis for treatment or other patient management decisions. Negative results must be combined with clinical observations, patient history, and epidemiological information. The expected result is Negative. Fact Sheet for Patients: SugarRoll.be Fact Sheet for Healthcare Providers: https://www.woods-mathews.com/ This test is not yet approved or cleared by the [place] FDA and  has been authorized for detection and/or diagnosis of SARS-CoV-2 by FDA under an Emergency Use  Authorization (EUA). This EUA will remain  in effect (meaning this test can be used) for the duration of the COVID-19 declaration under Section 56 4(b)(1) of the Act, 21 U.S.C. section 360bbb-3(b)(1), unless the authorization is terminated or revoked sooner. Performed at [facility], [place] [address] [place], [place] [address]   Respiratory Panel by PCR  Status: None   Collection Time: 02/16/19  4:05 AM   Specimen: Flu Kit Nasopharyngeal Swab; Respiratory  Result Value Ref Range Status   Adenovirus NOT DETECTED NOT DETECTED Final   Coronavirus 229E NOT DETECTED NOT DETECTED Final    Comment: (NOTE) The Coronavirus on the Respiratory Panel, DOES NOT test for the novel  Coronavirus (2019 nCoV)    Coronavirus HKU1 NOT DETECTED NOT DETECTED Final   Coronavirus NL63 NOT DETECTED NOT DETECTED Final   Coronavirus OC43 NOT DETECTED NOT DETECTED Final   Metapneumovirus NOT DETECTED NOT DETECTED Final   Rhinovirus / Enterovirus NOT DETECTED NOT DETECTED Final   Influenza A NOT DETECTED NOT DETECTED Final   Influenza B NOT DETECTED NOT DETECTED Final   Parainfluenza Virus 1 NOT DETECTED NOT DETECTED Final   Parainfluenza Virus 2 NOT DETECTED NOT DETECTED Final   Parainfluenza Virus 3 NOT DETECTED NOT DETECTED Final   Parainfluenza Virus 4 NOT DETECTED NOT DETECTED Final   Respiratory Syncytial Virus NOT DETECTED NOT DETECTED Final   Bordetella pertussis NOT DETECTED NOT DETECTED Final   Chlamydophila pneumoniae NOT DETECTED NOT DETECTED Final   Mycoplasma pneumoniae NOT DETECTED NOT DETECTED Final    Comment: Performed at [facility], [place]. [address] [place], [place] [address]  MRSA PCR Screening     Status: None   Collection Time: 02/16/19  5:21 AM   Specimen: Nasal Mucosa; Nasopharyngeal  Result Value Ref Range Status   MRSA by PCR NEGATIVE NEGATIVE Final    Comment:        The GeneXpert MRSA Assay (FDA approved for NASAL specimens only), is one component of a  comprehensive MRSA colonization surveillance program. It is not intended to diagnose MRSA infection nor to guide or monitor treatment for MRSA infections. Performed at [facility], [place] [address] [place], [place] [address]   Culture, bal-quantitative     Status: None (Preliminary result)   Collection Time: 02/18/19  4:55 PM   Specimen: Bronchoalveolar Lavage; Respiratory  Result Value Ref Range Status   Specimen Description BRONCHIAL ALVEOLAR LAVAGE  Final   Special Requests RIGHT MIDDLE LOBE  Final   Gram Stain   Final    FEW WBC PRESENT, PREDOMINANTLY MONONUCLEAR NO ORGANISMS SEEN    Culture   Final    NO GROWTH <12 HOURS Performed at [facility], [address] [place], [place] [address]    Report Status PENDING  Incomplete  Acid Fast Smear (AFB)     Status: None   Collection Time: 02/18/19  4:55 PM   Specimen: Bronchial Alveolar Lavage  Result Value Ref Range Status   AFB Specimen Processing Concentration  Final   Acid Fast Smear Negative  Final    Comment: (NOTE) Performed At: [facility] [place], [place] [id number] Rush Farmer MD WE:[id number]    Source (AFB) BRONCHIAL ALVEOLAR LAVAGE  Final    Comment: Performed at [facility], [place] [address] [place], [place] [address]  Respiratory Panel by PCR     Status: None   Collection Time: 02/18/19  9:00 PM   Specimen: Bronchial Alveolar Lavage; Respiratory  Result Value Ref Range Status   Adenovirus NOT DETECTED NOT DETECTED Final   Coronavirus 229E NOT DETECTED NOT DETECTED Final    Comment: (NOTE) The Coronavirus on the Respiratory Panel, DOES NOT test for the novel  Coronavirus (2019 nCoV)    Coronavirus HKU1 NOT DETECTED NOT DETECTED Final   Coronavirus  NL63 NOT DETECTED NOT DETECTED Final   Coronavirus OC43 NOT DETECTED NOT DETECTED Final   Metapneumovirus NOT DETECTED NOT DETECTED Final   Rhinovirus / Enterovirus NOT DETECTED NOT DETECTED Final   Influenza A  NOT DETECTED NOT DETECTED Final   Influenza B NOT DETECTED NOT DETECTED Final   Parainfluenza Virus 1 NOT DETECTED NOT DETECTED Final   Parainfluenza Virus 2 NOT DETECTED NOT DETECTED Final   Parainfluenza Virus 3 NOT DETECTED NOT DETECTED Final   Parainfluenza Virus 4 NOT DETECTED NOT DETECTED Final   Respiratory Syncytial Virus NOT DETECTED NOT DETECTED Final   Bordetella pertussis NOT DETECTED NOT DETECTED Final   Chlamydophila pneumoniae NOT DETECTED NOT DETECTED Final   Mycoplasma pneumoniae NOT DETECTED NOT DETECTED Final    Comment: Performed at [facility], [place] [address] [place], [place] [address]         Radiology Studies: No results found.      Scheduled Meds: . allopurinol  100 mg Oral BID  . amLODipine  2.5 mg Oral Daily  . enoxaparin (LOVENOX) injection  40 mg Subcutaneous Q0600  . ferrous sulfate  325 mg Oral Q breakfast  . fluconazole  100 mg Oral Daily  . furosemide  40 mg Oral Daily  . ipratropium-albuterol  3 mL Nebulization TID  . mouth rinse  15 mL Mouth Rinse BID  . nystatin  5 mL Oral QID  . pantoprazole  40 mg Oral Daily  . predniSONE  60 mg Oral Q breakfast  . sertraline  200 mg Oral q morning - 10a   Continuous Infusions: . sodium chloride 10 mL/hr at 02/16/19 1700     LOS: 4 days    Time spent: 35 minutes.     Elmarie Shiley, MD [facility]   If 7PM-7AM, please contact night-coverage  02/20/2019, 4:59 PM

## 2019-02-20 NOTE — Progress Notes (Signed)
Occupational Therapy Treatment Patient Details Name: Kurt [name] MRN: [id number] DOB: [birth year] Today's Date: 02/20/2019    History of present illness Pt is 69 y.o. male with medical history significant of chronic diastolic congestive heart failure, hypertension, CKD stage III, recent COVID pneumonia (COVID in 11/2018) presenting to the ED for evaluation of shortness of breath. Pt admitted with resp failure and PNE.  CT Chest negative for PE.  TB results pending.   OT comments  Pt making gradual progress towards OT goals this session. Session focus on functional mobility, toilet transfer and LB dressing technique for energy conservation. Pt continues to be limited by decreased activity tolerance, generalized weakness and dyspnea with exertion. Pt on 6L HFNC at rest needing increase to 7L during transfer from recliner<>BSC. Pt completed posterior pericare with set- up assist. Able to wean pt back to 6L with seated rest break and increased time. Pt agreeable to sit<>stand from recliner with RW and supervision assist. Pt able to complete marching in place for 2 min with pt RR increasing to 44, HR 121 and O2 90% on 6L needing seated rest break. Education provided on LD dressing technique to assist with EC at home with pt verbalizing understanding. Pt declined further mobility or ADLs as pt had just worked with PT prior to OT session. Feel pt is a great candidate for CIR level therapy as pt is motivated to return to PLOF and would benefit from structured schedule of CIR. Will continue to follow acutely per POC.    Follow Up Recommendations  CIR;Supervision - Intermittent    Equipment Recommendations  None recommended by OT    Recommendations for Other Services      Precautions / Restrictions Precautions Precautions: Fall Precaution Comments: monitor O2 Restrictions Weight Bearing Restrictions: No       Mobility Bed Mobility Overal bed mobility: (pt received and left in recliner)              General bed mobility comments: OOB in chair  Transfers Overall transfer level: Needs assistance Equipment used: Rolling walker (2 wheeled) Transfers: Sit to/from [employer] Sit to Stand: Supervision   Squat pivot transfers: Min guard     General transfer comment: min guard assist for squat pivot from relciner<>BSC with no AD. supervision for sit>stand from recliner with RW    Balance Overall balance assessment: Mild deficits observed, not formally tested Sitting-balance support: No upper extremity supported;Feet supported Sitting balance-Leahy Scale: Good Sitting balance - Comments: modI   Standing balance support: Single extremity supported Standing balance-Leahy Scale: Good Standing balance comment: close supervision with unilateral UE support of RW                           ADL either performed or assessed with clinical judgement   ADL Overall ADL's : Needs assistance/impaired                     Lower Body Dressing: Minimal assistance;Sitting/lateral leans Lower Body Dressing Details (indicate cue type and reason): education on LB dressing compensatory method of using figure four method to don pants as ECS Toilet Transfer: Min Systems developer Details (indicate cue type and reason): pt completed squat pivot transfer from recliner>BSC with supervision and no AD. Toileting- Clothing Manipulation and Hygiene: Set up;Sitting/lateral lean       Functional mobility during ADLs: Min guard;Minimal assistance;Rolling walker General ADL Comments: pt limited by decreased activity tolerance, generalized weakness  and dyspnea with exertion     Vision       Perception     Praxis      Cognition Arousal/Alertness: Awake/alert Behavior During Therapy: WFL for tasks assessed/performed Overall Cognitive Status: Within Functional Limits for tasks assessed                                 General  Comments: very motivated to return to PLOF        Exercises Exercises: General Upper Extremity;General Lower Extremity General Exercises - Upper Extremity Shoulder Flexion: AROM;Both;5 reps Shoulder ABduction: AROM;Right;5 reps Shoulder Horizontal ABduction: AROM;Right;5 reps Shoulder Horizontal ADduction: AROM;Right;5 reps Elbow Flexion: AROM;Both;5 reps General Exercises - Lower Extremity Ankle Circles/Pumps: AROM;Right;Other reps (comment);5 reps(green theraband) Long Arc Quad: AROM;Strengthening;10 reps;Seated;Other (comment)(blue theraband) Hip ABduction/ADduction: AROM;Both;10 reps;Other (comment);Seated(blue theraband)   Shoulder Instructions       General Comments pt on 6L HFNC needing increase to 7L as pt desaturated to 82% during transfer from reclner<>BSC, but able to wean back down to 6L with seated rest break. Pt completed ~ 2 mins of standing marching with  RR increasing  44 HR 121 and O2 90% on 6L HFNC. p    Pertinent Vitals/ Pain       Pain Assessment: No/denies pain  Home Living                                          Prior Functioning/Environment              Frequency  Min 2X/week        Progress Toward Goals  OT Goals(current goals can now be found in the care plan section)  Progress towards OT goals: Progressing toward goals  Acute Rehab OT Goals Patient Stated Goal: for breathing to get better  OT Goal Formulation: With patient Time For Goal Achievement: 03/05/19 Potential to Achieve Goals: Good  Plan Discharge plan remains appropriate    Co-evaluation                 AM-PAC OT "6 Clicks" Daily Activity     Outcome Measure   Help from another person eating meals?: None Help from another person taking care of personal grooming?: A Little Help from another person toileting, which includes using toliet, bedpan, or urinal?: A Little Help from another person bathing (including washing, rinsing, drying)?: A  Little Help from another person to put on and taking off regular upper body clothing?: A Little Help from another person to put on and taking off regular lower body clothing?: A Little 6 Click Score: 19    End of Session Equipment Utilized During Treatment: Rolling walker;Oxygen;Other (comment)(6-7L HFNC)  OT Visit Diagnosis: Unsteadiness on feet (R26.81)   Activity Tolerance Patient tolerated treatment well;Patient limited by fatigue;Other (comment)(dyspnea with exertion)   Patient Left in chair;with call bell/phone within reach   Nurse Communication Mobility status        Time: 1610-9604 OT Time Calculation (min): 28 min  Charges: OT General Charges $OT Visit: 1 Visit OT Treatments $Self Care/Home Management : 8-22 mins $Therapeutic Activity: 8-22 mins  Lanier Clam., COTA/L Acute Rehabilitation Services [phone]    Ihor Gully 02/20/2019, 4:02 PM

## 2019-02-20 NOTE — Progress Notes (Signed)
Physical Therapy Treatment Patient Details Name: Kurt [name] MRN: [id number] DOB: 04/28/1950 Today's Date: 02/20/2019    History of Present Illness Pt is 69 y.o. male with medical history significant of chronic diastolic congestive heart failure, hypertension, CKD stage III, recent COVID pneumonia (COVID in 11/2018) presenting to the ED for evaluation of shortness of breath. Pt admitted with resp failure and PNE.  CT Chest negative for PE.  TB results pending.    PT Comments    Pt remains limited by fatigue, SOB, and desaturation with mobility. Pt recovers within one-2 minutes, but recovery takes longer with greater duration of activity and greater desaturation. Pt performs therapeutic exercise well and demonstrates improved transfer and gait quality during session. Pt demonstrates great motivation to return to independent mobility and is unsafe to return home at this time due to extremely poor activity tolerance and desaturation. Pt will benefit from continued acute PT services to reduce falls risk and to aide in a return to independence.   Follow Up Recommendations  CIR     Equipment Recommendations  Other (comment)(Rollator if D/C home)    Recommendations for Other Services       Precautions / Restrictions Precautions Precautions: Fall Precaution Comments: monitor O2 Restrictions Weight Bearing Restrictions: No    Mobility  Bed Mobility Overal bed mobility: (pt received and left in recliner)                Transfers Overall transfer level: Needs assistance Equipment used: Rolling walker (2 wheeled) Transfers: Sit to/from Stand Sit to Stand: Supervision            Ambulation/Gait Ambulation/Gait assistance: Min guard Gait Distance (Feet): 30 Feet Assistive device: Rolling walker (2 wheeled) Gait Pattern/deviations: Step-through pattern;Decreased stride length;Wide base of support Gait velocity: decreased Gait velocity interpretation: <1.8 ft/sec, indicate  of risk for recurrent falls General Gait Details: pt initially marching in place for ~1 minute, desaturating to 88% prior to takig seated rest. Pt ambulating 10' with desaturation to 89% prior to rest. Pt finally ambulating 30' with desat to 81% at completion of walk prior to sitting.   Stairs             Wheelchair Mobility    Modified Rankin (Stroke Patients Only)       Balance Overall balance assessment: Needs assistance Sitting-balance support: No upper extremity supported;Feet supported Sitting balance-Leahy Scale: Good Sitting balance - Comments: modI   Standing balance support: Single extremity supported Standing balance-Leahy Scale: Good Standing balance comment: close supervision with unilateral UE support of RW                            Cognition Arousal/Alertness: Awake/alert Behavior During Therapy: WFL for tasks assessed/performed Overall Cognitive Status: Within Functional Limits for tasks assessed                                        Exercises General Exercises - Upper Extremity Shoulder Flexion: AROM;Both;5 reps Shoulder ABduction: AROM;Right;5 reps Shoulder Horizontal ABduction: AROM;Right;5 reps Shoulder Horizontal ADduction: AROM;Right;5 reps Elbow Flexion: AROM;Both;5 reps General Exercises - Lower Extremity Ankle Circles/Pumps: AROM;Right;Other reps (comment);5 reps(green theraband) Long Arc Quad: AROM;Strengthening;10 reps;Seated;Other (comment)(blue theraband) Hip ABduction/ADduction: AROM;Both;10 reps;Other (comment);Seated(blue theraband)    General Comments General comments (skin integrity, edema, etc.): pt on 6L HFNC throughout session with desaturation noted in gait details  section of note. Pt requires 1-2 minutes of seated rest to recover from desaturation. Pt able to stand statically with support of RW for 1 minute with stable saturation      Pertinent Vitals/Pain Pain Assessment: No/denies pain     Home Living                      Prior Function            PT Goals (current goals can now be found in the care plan section) Acute Rehab PT Goals Patient Stated Goal: for breathing to get better  Progress towards PT goals: Progressing toward goals(slowly)    Frequency    Min 3X/week      PT Plan Current plan remains appropriate    Co-evaluation              AM-PAC PT "6 Clicks" Mobility   Outcome Measure  Help needed turning from your back to your side while in a flat bed without using bedrails?: None Help needed moving from lying on your back to sitting on the side of a flat bed without using bedrails?: None Help needed moving to and from a bed to a chair (including a wheelchair)?: None Help needed standing up from a chair using your arms (e.g., wheelchair or bedside chair)?: None Help needed to walk in hospital room?: A Little Help needed climbing 3-5 steps with a railing? : A Lot 6 Click Score: 21    End of Session Equipment Utilized During Treatment: Oxygen Activity Tolerance: Patient limited by fatigue Patient left: in chair;with call bell/phone within reach Nurse Communication: Mobility status PT Visit Diagnosis: History of falling (Z91.81);Muscle weakness (generalized) (M62.81);Unsteadiness on feet (R26.81)     Time: 9833-8250 PT Time Calculation (min) (ACUTE ONLY): 39 min  Charges:  $Gait Training: 8-22 mins $Therapeutic Exercise: 8-22 mins $Therapeutic Activity: 8-22 mins                     Zenaida Niece, PT, DPT Acute Rehabilitation Pager: [phone]    Zenaida Niece 02/20/2019, 3:28 PM

## 2019-02-21 LAB — CULTURE, BAL-QUANTITATIVE W GRAM STAIN: Culture: NO GROWTH

## 2019-02-21 LAB — CULTURE, BLOOD (ROUTINE X 2)
Culture: NO GROWTH
Special Requests: ADEQUATE

## 2019-02-21 LAB — GROUP A STREP BY PCR: Group A Strep by PCR: NOT DETECTED

## 2019-02-21 MED ORDER — PHENOL 1.4 % MT LIQD
1.0000 | OROMUCOSAL | Status: DC | PRN
  Filled 2019-02-21: qty 177

## 2019-02-21 MED ORDER — KETOROLAC TROMETHAMINE 15 MG/ML IJ SOLN
15.0000 mg | Freq: Three times a day (TID) | INTRAMUSCULAR | Status: DC | PRN
  Administered 2019-02-21 – 2019-02-22 (×3): 15 mg via INTRAVENOUS
  Filled 2019-02-21 (×3): qty 1

## 2019-02-21 MED ORDER — KETOROLAC TROMETHAMINE 15 MG/ML IJ SOLN
15.0000 mg | Freq: Once | INTRAMUSCULAR | Status: AC
  Administered 2019-02-21: 10:00:00 15 mg via INTRAVENOUS
  Filled 2019-02-21: qty 1

## 2019-02-21 MED ORDER — HYDROCODONE-ACETAMINOPHEN 5-325 MG PO TABS
1.0000 | ORAL_TABLET | Freq: Four times a day (QID) | ORAL | Status: DC | PRN
  Administered 2019-02-21 – 2019-02-22 (×3): 2 via ORAL
  Filled 2019-02-21 (×3): qty 2

## 2019-02-21 MED ORDER — MAGIC MOUTHWASH W/LIDOCAINE
5.0000 mL | Freq: Four times a day (QID) | ORAL | Status: DC | PRN
  Administered 2019-02-21 (×2): 5 mL via ORAL
  Filled 2019-02-21 (×3): qty 5

## 2019-02-21 NOTE — Progress Notes (Signed)
Pharmacy Antibiotic Note  Kurt [name] is a 69 y.o. male admitted on 02/15/2019 with pneumonia -  CT chest 12/29 showed cavitary pna.  Pharmacy has been consulted for Cefepime dosing.   ID: Cefepime D#6 for HCAP - afeb,  WBC 8.1 down,  LA 0.7 - h/o COVID pna (diagnosed 11/25 and in-hospital 12/6-12/16/20, readmit 12/28-01/23/2019  treated w/Augmentin x 3 weeks) , CT chest 12/29 showed cavitary pna. Also on nystatin - QuantiFERON-TB gold negative. - 1/29: Abx stopped by ID, but restarted by TRH on 1/30.  1/25 Cefepime >> 1/29, resume 1/30 Fluconazole 1/30>> 1/25 Linezolid >> 1/27  1/25 BCx: ngtd 1/26 BCx:  ngtd 1/26 UCx:  neg/Final 1/26 Sputum:  GNR 1/26 Resp PCR: negative  1/26 COVID-19: negative MRSA PCR: neg;  Influenza A/B: neg 1/28 BAL: ngtd   Plan: Cefepime 2gm IV q8h   Height: 5' 10.5" (179.1 cm) Weight: 264 lb 15.9 oz (120.2 kg) IBW/kg (Calculated) : 74.15  Temp (24hrs), Avg:98.4 F (36.9 C), Min:98.2 F (36.8 C), Max:98.8 F (37.1 C)  Recent Labs  Lab 02/15/19 2213 02/15/19 2304 02/16/19 0038 02/17/19 0355 02/19/19 0506 02/20/19 0428  WBC 11.3*  --   --  11.1* 9.2 8.1  CREATININE  --  0.93  --  0.96 0.89 0.99  LATICACIDVEN  --   --  0.7  --   --   --     Estimated Creatinine Clearance: 90.9 mL/min (by C-G formula based on SCr of 0.99 mg/dL).    No Known Allergies  Xavyer Steenson S. Alford Highland, PharmD, BCPS Clinical Staff Pharmacist [email or web address]  02/21/2019 9:04 AM

## 2019-02-21 NOTE — Progress Notes (Signed)
PROGRESS NOTE    Kurt [name]  NFA:[id number] DOB: [birth year] DOA: 02/15/2019 PCP: Leonard Downing, MD   Brief Narrative: 69 year old with past medical history significant for chronic diastolic heart failure, hypertension, CKD stage III, recent Covid pneumonia, diagnosed during Thanksgiving.  Since Covid diagnosis patient has been admitted twice, treated for pneumonia.  His oxygen requirement has increased to 6 L of oxygen from 2 L on discharge.  He reports shortness of breath on exertion, increased oxygen requirement on exertion. Note patient was admitted from 12/6 1-12/16 for acute hypoxic respiratory failure secondary to COVID-19 pneumonia.  He was treated with remdesivir and steroids.  Readmitted 12/28 on ~1/2 for worsening dyspnea and hypoxemia treated for pneumonia at that time with Augmentin for 3 weeks. Presented with worsening shortness of breath, fever, CT angio negative for PE but showed bilateral groundglass opacity.  Assessment & Plan:   Principal Problem:   Cavitary pneumonia Active Problems:   Hypertension   History of COVID-19   Acute respiratory failure with hypoxia (HCC)   CHF (congestive heart failure) (HCC)   Acute on chronic respiratory failure (HCC)   Multifocal pneumonia   Diarrhea   Respiratory failure, acute (HCC)  1-Acute hypoxic Respiratory Failure; secondary to pneumonia -Ct angio; worsening bilateral ground glass opacities most compatible with PNA.18 mm nodule right middle lobe. Negative for PE>  -Treated initially  with cefepime and linezolid. Subsequently linezolid stopped.  -SARS coronavirus 2 - -Respiratory  panel negative. -QuantiFERON-TB gold negative.  -Follow blood cultures: No growth to date.  -Underwent  Bronchoscopy 1-28 -No evidence of aspiration on swallow eval.  -BAL; culture pending, AFB smear negative, AFB culture pending.  culture no growth 12 hours. , . Fungus pending.  -Cefepime stop by ID 1-29. Resume 1-30 Patient still  desats with minimal exertion.  Will ask CIR to evaluate.  Sputum culture from 1-26 grew few Gram negative rods; culture re incubated. Resume IV cefepime.  On prednisone 60 mg daily, will need long taper tx.  On Schedule nebulizer.   2-Diarrhea: GI pathogen and C. difficile order. Resolved.   3-OSA/OHS undiagnosed: He will need a sleep study. Started  CPAP/  Appreciate Brenda arranging CPAP.   Hypertension: Systolic blood pressure normal range. Resume Norvasc and hold lisinopril.   Chronic diastolic congestive heart failure: Does not appear volume overload BNP normal at 47.  Troponins negative Resume lasix,.   CKD stage A; cr stable.   Sore throat; white plaque, check strep culture.  Started  fluconazole.  Worsening sore throat. Pain with swallowing.  Will add mouthwash and lidocaine PRN sore throat. .   Estimated body mass index is 37.49 kg/m as calculated from the following:   Height as of this encounter: 5' 10.5" (1.791 m).   Weight as of this encounter: 120.2 kg.   DVT prophylaxis: Lovenox Code Status: Full code Family Communication: Care discussed with patient Disposition Plan:  Patient from home, remain in the hospital for treatment of acute on chronic hypoxic respiratory failure, recurrent pneumonia, increased oxygen requirement. Still desat on minimal exertion. BAck on IV cefepime.  -PT recommends CIR. Awaiting evaluation.  -Awaiting culture results.   Consultants:   Pulmonologist  ID  Procedures:   None  Antimicrobials:  Cefepime Linezolid  Subjective: He is complaining of sore throat and right ear pain   Objective: Vitals:   02/21/19 0807 02/21/19 0931 02/21/19 0934 02/21/19 1309  BP:  (!) 142/75 (!) 142/75   Pulse:      Resp:  20   Temp:   98.1 F (36.7 C)   TempSrc:   Oral   SpO2: 99%  99% 98%  Weight:      Height:        Intake/Output Summary (Last 24 hours) at 02/21/2019 1312 Last data filed at 02/21/2019 4401 Gross per 24 hour   Intake 480 ml  Output 1555 ml  Net -1075 ml   Filed Weights   02/19/19 0514 02/20/19 0502 02/21/19 0623  Weight: 122.2 kg 120.2 kg 120.2 kg    Examination:  General exam: NAD ENT; no significant white plaque on palate today Respiratory system: CTA Cardiovascular system; S 1 ,S 2 RRR Gastrointestinal system: BS present, soft, nt Central nervous system: alert Extremities: no edema Skin: No rashes   Data Reviewed: I have personally reviewed following labs and imaging studies  CBC: Recent Labs  Lab 02/15/19 2213 02/17/19 0355 02/19/19 0506 02/20/19 0428  WBC 11.3* 11.1* 9.2 8.1  NEUTROABS 9.0* 9.4*  --   --   HGB 11.5* 10.5* 11.3* 11.4*  HCT 36.5* 34.2* 36.7* 36.3*  MCV 88.0 87.9 87.2 86.6  PLT 382 395 474* 027*   Basic Metabolic Panel: Recent Labs  Lab 02/15/19 2304 02/17/19 0355 02/19/19 0506 02/20/19 0428  NA 140 137 136 137  K 4.5 4.2 4.4 4.9  CL 108 104 102 101  CO2 _0 GLUCOSE 119* 111* 86 161*  BUN _1 CREATININE 0.93 0.96 0.89 0.99  CALCIUM 8.6* 8.4* 8.4* 8.7*  MG 2.1  --   --   --    GFR: Estimated Creatinine Clearance: 90.9 mL/min (by C-G formula based on SCr of 0.99 mg/dL). Liver Function Tests: Recent Labs  Lab 02/15/19 2304  AST 25  ALT 26  ALKPHOS 67  BILITOT 0.7  PROT 6.2*  ALBUMIN 2.6*   No results for input(s): LIPASE, AMYLASE in the last 168 hours. No results for input(s): AMMONIA in the last 168 hours. Coagulation Profile: Recent Labs  Lab 02/16/19 0130  INR 1.2   Cardiac Enzymes: No results for input(s): CKTOTAL, CKMB, CKMBINDEX, TROPONINI in the last 168 hours. BNP (last 3 results) No results for input(s): PROBNP in the last 8760 hours. HbA1C: No results for input(s): HGBA1C in the last 72 hours. CBG: No results for input(s): GLUCAP in the last 168 hours. Lipid Profile: No results for input(s): CHOL, HDL, LDLCALC, TRIG, CHOLHDL, LDLDIRECT in the last 72 hours. Thyroid Function Tests: No results  for input(s): TSH, T4TOTAL, FREET4, T3FREE, THYROIDAB in the last 72 hours. Anemia Panel: No results for input(s): VITAMINB12, FOLATE, FERRITIN, TIBC, IRON, RETICCTPCT in the last 72 hours. Sepsis Labs: Recent Labs  Lab 02/16/19 0038 02/16/19 0136  PROCALCITON  --  <0.10  LATICACIDVEN 0.7  --     Recent Results (from the past 240 hour(s))  Blood Culture (routine x 2)     Status: None   Collection Time: 02/15/19 11:04 PM   Specimen: BLOOD RIGHT HAND  Result Value Ref Range Status   Specimen Description BLOOD RIGHT HAND  Final   Special Requests   Final    BOTTLES DRAWN AEROBIC AND ANAEROBIC Blood Culture results may not be optimal due to an inadequate volume of blood received in culture bottles   Culture   Final    NO GROWTH 5 DAYS Performed at [facility], [place] [address] [place], [place] [address]    Report Status 02/20/2019 FINAL  Final  Expectorated  sputum assessment w rflx to resp cult     Status: None   Collection Time: 02/16/19 12:02 AM   Specimen: Sputum  Result Value Ref Range Status   Specimen Description SPUTUM  Final   Special Requests NONE  Final   Sputum evaluation   Final    THIS SPECIMEN IS ACCEPTABLE FOR SPUTUM CULTURE Performed at [facility], [address], [place], [place] [address]    Report Status 02/20/2019 FINAL  Final  Culture, respiratory     Status: None (Preliminary result)   Collection Time: 02/16/19 12:02 AM   Specimen: SPU  Result Value Ref Range Status   Specimen Description SPUTUM  Final   Special Requests NONE Reflexed from [id number]  Final   Gram Stain   Final    MODERATE WBC PRESENT, PREDOMINANTLY PMN FEW SQUAMOUS EPITHELIAL CELLS PRESENT ABUNDANT GRAM POSITIVE COCCI MODERATE GRAM NEGATIVE COCCOBACILLI FEW GRAM NEGATIVE RODS FEW GRAM POSITIVE RODS    Culture   Final    FEW GRAM NEGATIVE RODS CULTURE REINCUBATED FOR BETTER GROWTH Performed at [facility], [place] [address] [place], [place] [address]    Report  Status PENDING  Incomplete  Blood Culture (routine x 2)     Status: None (Preliminary result)   Collection Time: 02/16/19  1:30 AM   Specimen: BLOOD LEFT ARM  Result Value Ref Range Status   Specimen Description BLOOD LEFT ARM  Final   Special Requests   Final    BOTTLES DRAWN AEROBIC AND ANAEROBIC Blood Culture adequate volume   Culture   Final    NO GROWTH 4 DAYS Performed at [facility], [place] [address] [place], [place] [address]    Report Status PENDING  Incomplete  Urine culture     Status: None   Collection Time: 02/16/19  3:05 AM   Specimen: In/Out Cath Urine  Result Value Ref Range Status   Specimen Description IN/OUT CATH URINE  Final   Special Requests NONE  Final   Culture   Final    NO GROWTH Performed at [facility], [place] [address] [place], [place] [address]    Report Status 02/16/2019 FINAL  Final  SARS CORONAVIRUS 2 (TAT 6-24 HRS) Nasopharyngeal Nasal Mucosa     Status: None   Collection Time: 02/16/19  3:30 AM   Specimen: Nasal Mucosa; Nasopharyngeal  Result Value Ref Range Status   SARS Coronavirus 2 NEGATIVE NEGATIVE Final    Comment: (NOTE) SARS-CoV-2 target nucleic acids are NOT DETECTED. The SARS-CoV-2 RNA is generally detectable in upper and lower respiratory specimens during the acute phase of infection. Negative results do not preclude SARS-CoV-2 infection, do not rule out co-infections with other pathogens, and should not be used as the sole basis for treatment or other patient management decisions. Negative results must be combined with clinical observations, patient history, and epidemiological information. The expected result is Negative. Fact Sheet for Patients: SugarRoll.be Fact Sheet for Healthcare Providers: https://www.woods-mathews.com/ This test is not yet approved or cleared by the [place] FDA and  has been authorized for detection and/or diagnosis of SARS-CoV-2 by FDA under  an Emergency Use Authorization (EUA). This EUA will remain  in effect (meaning this test can be used) for the duration of the COVID-19 declaration under Section 56 4(b)(1) of the Act, 21 U.S.C. section 360bbb-3(b)(1), unless the authorization is terminated or revoked sooner. Performed at [facility], [place] [address] [place], [place] [address]  Respiratory Panel by PCR     Status: None   Collection Time: 02/16/19  4:05 AM   Specimen: Flu Kit Nasopharyngeal Swab; Respiratory  Result Value Ref Range Status   Adenovirus NOT DETECTED NOT DETECTED Final   Coronavirus 229E NOT DETECTED NOT DETECTED Final    Comment: (NOTE) The Coronavirus on the Respiratory Panel, DOES NOT test for the novel  Coronavirus (2019 nCoV)    Coronavirus HKU1 NOT DETECTED NOT DETECTED Final   Coronavirus NL63 NOT DETECTED NOT DETECTED Final   Coronavirus OC43 NOT DETECTED NOT DETECTED Final   Metapneumovirus NOT DETECTED NOT DETECTED Final   Rhinovirus / Enterovirus NOT DETECTED NOT DETECTED Final   Influenza A NOT DETECTED NOT DETECTED Final   Influenza B NOT DETECTED NOT DETECTED Final   Parainfluenza Virus 1 NOT DETECTED NOT DETECTED Final   Parainfluenza Virus 2 NOT DETECTED NOT DETECTED Final   Parainfluenza Virus 3 NOT DETECTED NOT DETECTED Final   Parainfluenza Virus 4 NOT DETECTED NOT DETECTED Final   Respiratory Syncytial Virus NOT DETECTED NOT DETECTED Final   Bordetella pertussis NOT DETECTED NOT DETECTED Final   Chlamydophila pneumoniae NOT DETECTED NOT DETECTED Final   Mycoplasma pneumoniae NOT DETECTED NOT DETECTED Final    Comment: Performed at [facility], [place]. [address] [place], [place] [address]  MRSA PCR Screening     Status: None   Collection Time: 02/16/19  5:21 AM   Specimen: Nasal Mucosa; Nasopharyngeal  Result Value Ref Range Status   MRSA by PCR NEGATIVE NEGATIVE Final    Comment:        The GeneXpert MRSA Assay (FDA approved for NASAL specimens only), is one  component of a comprehensive MRSA colonization surveillance program. It is not intended to diagnose MRSA infection nor to guide or monitor treatment for MRSA infections. Performed at [facility], [place] [address], [place], [place] [address]   Culture, bal-quantitative     Status: None   Collection Time: 02/18/19  4:55 PM   Specimen: Bronchoalveolar Lavage; Respiratory  Result Value Ref Range Status   Specimen Description BRONCHIAL ALVEOLAR LAVAGE  Final   Special Requests RIGHT MIDDLE LOBE  Final   Gram Stain   Final    FEW WBC PRESENT, PREDOMINANTLY MONONUCLEAR NO ORGANISMS SEEN    Culture   Final    NO GROWTH 2 DAYS Performed at [facility], [place] [address], [place], [place] [address]    Report Status 02/21/2019 FINAL  Final  Acid Fast Smear (AFB)     Status: None   Collection Time: 02/18/19  4:55 PM   Specimen: Bronchial Alveolar Lavage  Result Value Ref Range Status   AFB Specimen Processing Concentration  Final   Acid Fast Smear Negative  Final    Comment: (NOTE) Performed At: [facility] [place], [place] [id number] Rush Farmer MD [id number]    Source (AFB) BRONCHIAL ALVEOLAR LAVAGE  Final    Comment: Performed at [facility], [place] [address], [place], [place] [address]  Respiratory Panel by PCR     Status: None   Collection Time: 02/18/19  9:00 PM   Specimen: Bronchial Alveolar Lavage; Respiratory  Result Value Ref Range Status   Adenovirus NOT DETECTED NOT DETECTED Final   Coronavirus 229E NOT DETECTED NOT DETECTED Final    Comment: (NOTE) The Coronavirus on the Respiratory Panel, DOES NOT test for the novel  Coronavirus (2019 nCoV)    Coronavirus HKU1 NOT  DETECTED NOT DETECTED Final   Coronavirus NL63 NOT DETECTED NOT DETECTED Final   Coronavirus OC43 NOT DETECTED NOT DETECTED Final   Metapneumovirus NOT DETECTED NOT DETECTED Final   Rhinovirus / Enterovirus NOT DETECTED NOT DETECTED Final   Influenza A NOT  DETECTED NOT DETECTED Final   Influenza B NOT DETECTED NOT DETECTED Final   Parainfluenza Virus 1 NOT DETECTED NOT DETECTED Final   Parainfluenza Virus 2 NOT DETECTED NOT DETECTED Final   Parainfluenza Virus 3 NOT DETECTED NOT DETECTED Final   Parainfluenza Virus 4 NOT DETECTED NOT DETECTED Final   Respiratory Syncytial Virus NOT DETECTED NOT DETECTED Final   Bordetella pertussis NOT DETECTED NOT DETECTED Final   Chlamydophila pneumoniae NOT DETECTED NOT DETECTED Final   Mycoplasma pneumoniae NOT DETECTED NOT DETECTED Final    Comment: Performed at [facility], [place] [address], [place], [place] [address]  Culture, group A strep     Status: None (Preliminary result)   Collection Time: 02/20/19  4:13 PM   Specimen: Throat  Result Value Ref Range Status   Specimen Description THROAT  Final   Special Requests NONE  Final   Culture   Final    CULTURE REINCUBATED FOR BETTER GROWTH Performed at [facility], [place] [address] [place], [place] [address]    Report Status PENDING  Incomplete  Group A Strep by PCR     Status: None   Collection Time: 02/21/19 11:17 AM   Specimen: Throat; Sterile Swab  Result Value Ref Range Status   Group A Strep by PCR NOT DETECTED NOT DETECTED Final    Comment: Performed at [facility], [place] [address] [place], [place] [address]         Radiology Studies: No results found.      Scheduled Meds: . allopurinol  100 mg Oral BID  . amLODipine  2.5 mg Oral Daily  . enoxaparin (LOVENOX) injection  40 mg Subcutaneous Q0600  . ferrous sulfate  325 mg Oral Q breakfast  . fluconazole  100 mg Oral Daily  . furosemide  40 mg Oral Daily  . ipratropium-albuterol  3 mL Nebulization TID  . mouth rinse  15 mL Mouth Rinse BID  . pantoprazole  40 mg Oral Daily  . predniSONE  60 mg Oral Q breakfast  . sertraline  200 mg Oral q morning - 10a   Continuous Infusions: . sodium chloride 10 mL/hr at 02/16/19 1700  . ceFEPime (MAXIPIME) IV 2 g  (02/21/19 1153)     LOS: 5 days    Time spent: 35 minutes.     Elmarie Shiley, MD [facility]   If 7PM-7AM, please contact night-coverage  02/21/2019, 1:12 PM

## 2019-02-22 DIAGNOSIS — U071 COVID-19: Secondary | ICD-10-CM

## 2019-02-22 DIAGNOSIS — J9621 Acute and chronic respiratory failure with hypoxia: Secondary | ICD-10-CM

## 2019-02-22 DIAGNOSIS — J984 Other disorders of lung: Secondary | ICD-10-CM

## 2019-02-22 DIAGNOSIS — J189 Pneumonia, unspecified organism: Secondary | ICD-10-CM

## 2019-02-22 LAB — CULTURE, RESPIRATORY W GRAM STAIN

## 2019-02-22 LAB — BASIC METABOLIC PANEL
Anion gap: 11 (ref 5–15)
BUN: 22 mg/dL (ref 8–23)
CO2: 29 mmol/L (ref 22–32)
Calcium: 8.9 mg/dL (ref 8.9–10.3)
Chloride: 98 mmol/L (ref 98–111)
Creatinine, Ser: 1.05 mg/dL (ref 0.61–1.24)
GFR calc Af Amer: >60 mL/min (ref >60)
GFR calc non Af Amer: >60 mL/min (ref >60)
Glucose, Bld: 121 mg/dL — ABNORMAL HIGH (ref 70–99)
Potassium: 4.7 mmol/L (ref 3.5–5.1)
Sodium: 138 mmol/L (ref 135–145)

## 2019-02-22 LAB — CULTURE, GROUP A STREP (THRC)

## 2019-02-22 LAB — CYTOLOGY - NON PAP

## 2019-02-22 MED ORDER — PANTOPRAZOLE SODIUM 40 MG PO TBEC
40.0000 mg | DELAYED_RELEASE_TABLET | Freq: Two times a day (BID) | ORAL | Status: DC
  Administered 2019-02-22 – 2019-02-23 (×2): 40 mg via ORAL
  Filled 2019-02-22 (×2): qty 1

## 2019-02-22 MED ORDER — SULFAMETHOXAZOLE-TRIMETHOPRIM 800-160 MG PO TABS
1.0000 | ORAL_TABLET | ORAL | Status: DC
  Administered 2019-02-22: 10:00:00 1 via ORAL
  Filled 2019-02-22: qty 1

## 2019-02-22 MED ORDER — PREDNISONE 20 MG PO TABS: 40.0000 mg | ORAL_TABLET | Freq: Every day | ORAL | Status: DC

## 2019-02-22 MED ORDER — MAGIC MOUTHWASH W/LIDOCAINE
5.0000 mL | Freq: Three times a day (TID) | ORAL | Status: DC
  Administered 2019-02-22 – 2019-02-23 (×5): 5 mL via ORAL
  Filled 2019-02-22 (×6): qty 5

## 2019-02-22 MED ORDER — PREDNISONE 20 MG PO TABS
60.0000 mg | ORAL_TABLET | Freq: Every day | ORAL | Status: DC
  Administered 2019-02-22 – 2019-02-23 (×2): 60 mg via ORAL
  Filled 2019-02-22 (×2): qty 3

## 2019-02-22 MED ORDER — NYSTATIN 100000 UNIT/ML MT SUSP
5.0000 mL | Freq: Four times a day (QID) | OROMUCOSAL | Status: DC
  Administered 2019-02-22 – 2019-02-23 (×5): 500000 [IU] via ORAL
  Filled 2019-02-22 (×5): qty 5

## 2019-02-22 MED ORDER — PREDNISONE 20 MG PO TABS: 20.0000 mg | ORAL_TABLET | Freq: Every day | ORAL | Status: DC

## 2019-02-22 MED ORDER — SODIUM CHLORIDE 0.9 % IV SOLN
2.0000 g | Freq: Three times a day (TID) | INTRAVENOUS | Status: DC
  Administered 2019-02-22 – 2019-02-23 (×3): 2 g via INTRAVENOUS
  Filled 2019-02-22 (×5): qty 2

## 2019-02-22 MED ORDER — SALINE SPRAY 0.65 % NA SOLN
1.0000 | NASAL | Status: DC | PRN
  Administered 2019-02-22: 19:00:00 1 via NASAL
  Filled 2019-02-22: qty 44

## 2019-02-22 MED ORDER — CIPROFLOXACIN HCL 500 MG PO TABS
750.0000 mg | ORAL_TABLET | Freq: Two times a day (BID) | ORAL | Status: DC
  Administered 2019-02-22: 15:00:00 750 mg via ORAL
  Filled 2019-02-22: qty 2

## 2019-02-22 NOTE — Progress Notes (Signed)
Inpatient Rehabilitation Admissions Coordinator  Inpatient rehab consult received. I met with patient at bedside for rehab assessment. We discussed goals and expectations of an inpt rehab admit. Pt refuses adamantly SNF. I will begin insurance authorization with [employer]. I will follow up once they have made a decision.  Danne Baxter, RN, MSN Rehab Admissions Coordinator [phone] 02/22/2019 11:33 AM

## 2019-02-22 NOTE — Progress Notes (Signed)
Occupational Therapy Treatment Patient Details Name: MARVIS SAEFONG MRN: [id number] DOB: [birth year] Today's Date: 02/22/2019    History of present illness Pt is 69 y.o. male with medical history significant of chronic diastolic congestive heart failure, hypertension, CKD stage III, recent COVID pneumonia (COVID in 11/2018) presenting to the ED for evaluation of shortness of breath. Pt admitted with resp failure and PNE.  CT Chest negative for PE.  TB results pending.   OT comments  This 69 yo male who is normally very active (water therapy 4 days a week prior to his COVID in Nov 2020) presents to acute OT able to move fairly well, but his endurance for activity is what is holding him back. Drop in O2 sats into mid 80's on 6 liters with minimal exertional, at rest he can maintain sats in mid 90's on 3 liters. He is willing to try and do any and all that is asked of him to get to a point he can return home and not have to keep coming back to the hospital.    Follow Up Recommendations  CIR(feel post CIR he would not need S)    Equipment Recommendations  3 in 1 bedside commode       Precautions / Restrictions Precautions Precautions: Fall Precaution Comments: monitor O2 ( used 6 liters for activity and 3 at rest) Restrictions Weight Bearing Restrictions: No       Mobility Bed Mobility               General bed mobility comments: pt up in recliner upon my arrival  Transfers Overall transfer level: Needs assistance Equipment used: None Transfers: Sit to/from [employer] Sit to Stand: Supervision Stand pivot transfers: Supervision(management of lines)       General transfer comment: Pt is able to do stand pivots at a S level however with just this activity he gets SOB even on 6 liters and sats dropped as low as 84% with education on purse lipped breathing to gets sats back up into mid 90's on 6 liters.    Balance Overall balance assessment: Needs  assistance Sitting-balance support: No upper extremity supported;Feet supported Sitting balance-Leahy Scale: Good Sitting balance - Comments: modI   Standing balance support: Single extremity supported Standing balance-Leahy Scale: Fair Standing balance comment: dependent on UE support for ambulation, pt okay with static standing                           ADL either performed or assessed with clinical judgement   ADL Overall ADL's : Needs assistance/impaired                         Toilet Transfer: Buyer, retail Details (indicate cue type and reason): bed<>recliner, no AD           General ADL Comments: With use of AE (reacher and sock aid) pt with less SOB than without AE     Vision Patient Visual Report: No change from baseline            Cognition Arousal/Alertness: Awake/alert Behavior During Therapy: WFL for tasks assessed/performed Overall Cognitive Status: Within Functional Limits for tasks assessed                                 General Comments: very motivated to return to PLOF (active--going to water therapy  4 days a week prior to [place] in Nov 2020)        Exercises Other Exercises Other Exercises: Had pt work on sit<>stands from bed (x5 reps) then to see what his O2 would do and how he felt on 6 liters of O2 (turned up to 6 liters for activity, back to 3 at rest at end of session). Pt with definitive SOB with again sats dropping into mid 80's, with increased time to get back up into mid 90's with purse lipped breathing--pt monitoring his own sat level on monitor. Let pt recover and then tried on 3 reps on 6 liters and pt tolerated this much better as far as recovery time. Instructed him to do these sit<>stands every 30 minutes while he is up either in recliner or EOB, 3 reps each time and to use monitor in additon to how he feels to check his sats (this should help build up his endurance for  activity, since otherwise he is just sitting/laying if therapy is not working with him). Pt with increased coughing wtih activity as well.      General Comments MD just turned O2 down from 6L to 3L, pt with SOB with just standing and dropped into 80s. Pt maintain SPO2 >92% on 6LO2 via [place] during amb    Pertinent Vitals/ Pain       Pain Assessment: No/denies pain         Frequency  Min 2X/week        Progress Toward Goals  OT Goals(current goals can now be found in the care plan section)  Progress towards OT goals: Progressing toward goals     Plan Discharge plan remains appropriate       AM-PAC OT "6 Clicks" Daily Activity     Outcome Measure   Help from another person eating meals?: None Help from another person taking care of personal grooming?: A Little Help from another person toileting, which includes using toliet, bedpan, or urinal?: A Little Help from another person bathing (including washing, rinsing, drying)?: A Little Help from another person to put on and taking off regular upper body clothing?: A Little Help from another person to put on and taking off regular lower body clothing?: A Little 6 Click Score: 19    End of Session Equipment Utilized During Treatment: Oxygen(3 liters at rest, 6 with activity)  OT Visit Diagnosis: Unsteadiness on feet (R26.81);Other (comment)(cardio-pulmonary issues)   Activity Tolerance (increased time to recover from activity)   Patient Left in chair;with call bell/phone within reach   Nurse Communication Mobility status(sit<>stand activity I have asked him to do and I chat texted RT to inquire about a flutter valve.)        Time: 1140-1217 OT Time Calculation (min): 37 min  Charges: OT General Charges $OT Visit: 1 Visit OT Treatments $Self Care/Home Management : 8-22 mins $Therapeutic Activity: 8-22 mins  Cathy  OTR/L Acute [employer] Pager [phone] Office [phone]      02/22/2019, 12:43  PM

## 2019-02-22 NOTE — Progress Notes (Signed)
PROGRESS NOTE    Kurt [name]  PXT:[id number] DOB: 19-Nov-1950 DOA: 02/15/2019 PCP: Leonard Downing, MD   Brief Narrative: 69 year old with past medical history significant for chronic diastolic heart failure, hypertension, CKD stage III, recent Covid pneumonia, diagnosed during Thanksgiving.  Since Covid diagnosis patient has been admitted twice, treated for pneumonia.  His oxygen requirement has increased to 6 L of oxygen from 2 L on discharge.  He reports shortness of breath on exertion, increased oxygen requirement on exertion. Note patient was admitted from 12/6 1-12/16 for acute hypoxic respiratory failure secondary to COVID-19 pneumonia.  He was treated with remdesivir and steroids.  Readmitted 12/28 on ~1/2 for worsening dyspnea and hypoxemia treated for pneumonia at that time with Augmentin for 3 weeks. Presented with worsening shortness of breath, fever, CT angio negative for PE but showed bilateral groundglass opacity.  Assessment & Plan:   Principal Problem:   Cavitary pneumonia Active Problems:   Hypertension   History of COVID-19   Acute respiratory failure with hypoxia (HCC)   CHF (congestive heart failure) (HCC)   Acute on chronic respiratory failure (HCC)   Multifocal pneumonia   Diarrhea   Respiratory failure, acute (HCC)  1-Acute hypoxic Respiratory Failure; secondary to pneumonia -Ct angio; worsening bilateral ground glass opacities most compatible with PNA.18 mm nodule right middle lobe. Negative for PE>  -Treated initially  with cefepime and linezolid. Subsequently linezolid stopped.  -SARS coronavirus 2 - -Respiratory  panel negative. -QuantiFERON-TB gold negative.  -Follow blood cultures: No growth to date.  -Underwent  Bronchoscopy 1-28 -No evidence of aspiration on swallow eval.  -BAL; culture pending, AFB smear negative, AFB culture pending.  Culture no growth. Fungus pending.  -Cefepime stop by ID 1-29. Resume 1-30 -Patient still desats with  minimal exertion.  -CIR, consulted, awaiting insurance.  Sputum culture from 1-26 grew few Gram negative rods; culture re incubated. Resume IV cefepime. Per ID no need for cefepime. Will need to follow up cultures from 1-26.  On prednisone 60 mg daily, will need long taper tx. 60 for one month, 40 for one month then 20 month.  On Schedule nebulizer.   2-Diarrhea: GI pathogen and C. difficile order. Resolved.   3-OSA/OHS undiagnosed: He will need a sleep study. Started  CPAP/  Appreciate Brenda arranging CPAP.   Hypertension: Systolic blood pressure normal range. Resume Norvasc and hold lisinopril.   Chronic diastolic congestive heart failure: Does not appear volume overload BNP normal at 47.  Troponins negative Resume lasix,.   CKD stage A; cr stable.   Sore throat; Started  fluconazole. Diflucan discontinue by pulmonary .  Worsening sore throat. Pain with swallowing.  Will add mouthwash and lidocaine PRN sore throat. .   Estimated body mass index is 37.58 kg/m as calculated from the following:   Height as of this encounter: 5' 10.5" (1.791 m).   Weight as of this encounter: 120.5 kg.   DVT prophylaxis: Lovenox Code Status: Full code Family Communication: Care discussed with patient Disposition Plan:  Patient from home. - Remain in the hospital for treatment of acute on chronic hypoxic respiratory failure, recurrent pneumonia, increased oxygen requirement. Still desat on minimal exertion. Awaiting CIR/ insurance approval.  -PT recommends CIR. Awaiting insurance.  -Awaiting culture results.   Consultants:   Pulmonologist  ID  Procedures:   None  Antimicrobials:  Cefepime Linezolid  Subjective: Sore throat improved. He was able to tolerate PT better today, he is very eager to get to his prior baseline of  independence.   Objective: Vitals:   02/21/19 2127 02/22/19 0644 02/22/19 0903 02/22/19 0916  BP: 130/68 136/68  111/67  Pulse: 92 70    Resp: 19      Temp: 98.8 F (37.1 C) 98.5 F (36.9 C)    TempSrc: Oral Oral    SpO2: 100% 100% 95%   Weight:  120.5 kg    Height:        Intake/Output Summary (Last 24 hours) at 02/22/2019 1302 Last data filed at 02/22/2019 0900 Gross per 24 hour  Intake 560 ml  Output 1120 ml  Net -560 ml   Filed Weights   02/20/19 0502 02/21/19 0623 02/22/19 0644  Weight: 120.2 kg 120.2 kg 120.5 kg    Examination:  General exam: NAD ENT; No plaque.  Respiratory system: CTA Cardiovascular system; S 1, S 2 RRR Gastrointestinal system: BS present, soft, nt Central nervous system: Alert, follows command Extremities: No edema Skin: no rashes   Data Reviewed: I have personally reviewed following labs and imaging studies  CBC: Recent Labs  Lab 02/15/19 2213 02/17/19 0355 02/19/19 0506 02/20/19 0428  WBC 11.3* 11.1* 9.2 8.1  NEUTROABS 9.0* 9.4*  --   --   HGB 11.5* 10.5* 11.3* 11.4*  HCT 36.5* 34.2* 36.7* 36.3*  MCV 88.0 87.9 87.2 86.6  PLT 382 395 474* 941*   Basic Metabolic Panel: Recent Labs  Lab 02/15/19 2304 02/17/19 0355 02/19/19 0506 02/20/19 0428 02/22/19 0416  NA 140 137 136 137 138  K 4.5 4.2 4.4 4.9 4.7  CL 108 104 102 101 98  CO2 _0 GLUCOSE 119* 111* 86 161* 121*  BUN _1 CREATININE 0.93 0.96 0.89 0.99 1.05  CALCIUM 8.6* 8.4* 8.4* 8.7* 8.9  MG 2.1  --   --   --   --    GFR: Estimated Creatinine Clearance: 85.8 mL/min (by C-G formula based on SCr of 1.05 mg/dL). Liver Function Tests: Recent Labs  Lab 02/15/19 2304  AST 25  ALT 26  ALKPHOS 67  BILITOT 0.7  PROT 6.2*  ALBUMIN 2.6*   No results for input(s): LIPASE, AMYLASE in the last 168 hours. No results for input(s): AMMONIA in the last 168 hours. Coagulation Profile: Recent Labs  Lab 02/16/19 0130  INR 1.2   Cardiac Enzymes: No results for input(s): CKTOTAL, CKMB, CKMBINDEX, TROPONINI in the last 168 hours. BNP (last 3 results) No results for input(s): PROBNP in the last 8760  hours. HbA1C: No results for input(s): HGBA1C in the last 72 hours. CBG: No results for input(s): GLUCAP in the last 168 hours. Lipid Profile: No results for input(s): CHOL, HDL, LDLCALC, TRIG, CHOLHDL, LDLDIRECT in the last 72 hours. Thyroid Function Tests: No results for input(s): TSH, T4TOTAL, FREET4, T3FREE, THYROIDAB in the last 72 hours. Anemia Panel: No results for input(s): VITAMINB12, FOLATE, FERRITIN, TIBC, IRON, RETICCTPCT in the last 72 hours. Sepsis Labs: Recent Labs  Lab 02/16/19 0038 02/16/19 0136  PROCALCITON  --  <0.10  LATICACIDVEN 0.7  --     Recent Results (from the past 240 hour(s))  Blood Culture (routine x 2)     Status: None   Collection Time: 02/15/19 11:04 PM   Specimen: BLOOD RIGHT HAND  Result Value Ref Range Status   Specimen Description BLOOD RIGHT HAND  Final   Special Requests   Final    BOTTLES DRAWN AEROBIC AND ANAEROBIC Blood Culture results may not be optimal due to  an inadequate volume of blood received in culture bottles   Culture   Final    NO GROWTH 5 DAYS Performed at [facility], [place] [address] [place], [place] [address]    Report Status 02/20/2019 FINAL  Final  Expectorated sputum assessment w rflx to resp cult     Status: None   Collection Time: 02/16/19 12:02 AM   Specimen: Sputum  Result Value Ref Range Status   Specimen Description SPUTUM  Final   Special Requests NONE  Final   Sputum evaluation   Final    THIS SPECIMEN IS ACCEPTABLE FOR SPUTUM CULTURE Performed at [facility], [place] [address], [place], [place] [address]    Report Status 02/20/2019 FINAL  Final  Culture, respiratory     Status: None   Collection Time: 02/16/19 12:02 AM   Specimen: SPU  Result Value Ref Range Status   Specimen Description SPUTUM  Final   Special Requests NONE Reflexed from [id number]  Final   Gram Stain   Final    MODERATE WBC PRESENT, PREDOMINANTLY PMN FEW SQUAMOUS EPITHELIAL CELLS PRESENT ABUNDANT GRAM POSITIVE  COCCI MODERATE GRAM NEGATIVE COCCOBACILLI FEW GRAM NEGATIVE RODS FEW GRAM POSITIVE RODS Performed at [facility], [place] [address] [place], [place] [address]    Culture FEW PSEUDOMONAS AERUGINOSA  Final   Report Status 02/22/2019 FINAL  Final   Organism ID, Bacteria PSEUDOMONAS AERUGINOSA  Final      Susceptibility   Pseudomonas aeruginosa - MIC*    CEFTAZIDIME <=1 SENSITIVE Sensitive     CIPROFLOXACIN <=0.25 SENSITIVE Sensitive     GENTAMICIN <=1 SENSITIVE Sensitive     IMIPENEM <=0.25 SENSITIVE Sensitive     PIP/TAZO <=4 SENSITIVE Sensitive     * FEW PSEUDOMONAS AERUGINOSA  Blood Culture (routine x 2)     Status: None   Collection Time: 02/16/19  1:30 AM   Specimen: BLOOD LEFT ARM  Result Value Ref Range Status   Specimen Description BLOOD LEFT ARM  Final   Special Requests   Final    BOTTLES DRAWN AEROBIC AND ANAEROBIC Blood Culture adequate volume   Culture   Final    NO GROWTH 5 DAYS Performed at [facility], [place] [address] [place], [place] [address]    Report Status 02/21/2019 FINAL  Final  Urine culture     Status: None   Collection Time: 02/16/19  3:05 AM   Specimen: In/Out Cath Urine  Result Value Ref Range Status   Specimen Description IN/OUT CATH URINE  Final   Special Requests NONE  Final   Culture   Final    NO GROWTH Performed at [facility], [place] [address], [place], [place] [address]    Report Status 02/16/2019 FINAL  Final  SARS CORONAVIRUS 2 (TAT 6-24 HRS) Nasopharyngeal Nasal Mucosa     Status: None   Collection Time: 02/16/19  3:30 AM   Specimen: Nasal Mucosa; Nasopharyngeal  Result Value Ref Range Status   SARS Coronavirus 2 NEGATIVE NEGATIVE Final    Comment: (NOTE) SARS-CoV-2 target nucleic acids are NOT DETECTED. The SARS-CoV-2 RNA is generally detectable in upper and lower respiratory specimens during the acute phase of infection. Negative results do not preclude SARS-CoV-2 infection, do not rule out co-infections with  other pathogens, and should not be used as the sole basis for treatment or other patient management decisions. Negative results must be combined with clinical observations, patient history, and epidemiological information. The  expected result is Negative. Fact Sheet for Patients: SugarRoll.be Fact Sheet for Healthcare Providers: https://www.woods-mathews.com/ This test is not yet approved or cleared by the [place] FDA and  has been authorized for detection and/or diagnosis of SARS-CoV-2 by FDA under an Emergency Use Authorization (EUA). This EUA will remain  in effect (meaning this test can be used) for the duration of the COVID-19 declaration under Section 56 4(b)(1) of the Act, 21 U.S.C. section 360bbb-3(b)(1), unless the authorization is terminated or revoked sooner. Performed at [facility], [place] [address] [place], [place] [address]   Respiratory Panel by PCR     Status: None   Collection Time: 02/16/19  4:05 AM   Specimen: Flu Kit Nasopharyngeal Swab; Respiratory  Result Value Ref Range Status   Adenovirus NOT DETECTED NOT DETECTED Final   Coronavirus 229E NOT DETECTED NOT DETECTED Final    Comment: (NOTE) The Coronavirus on the Respiratory Panel, DOES NOT test for the novel  Coronavirus (2019 nCoV)    Coronavirus HKU1 NOT DETECTED NOT DETECTED Final   Coronavirus NL63 NOT DETECTED NOT DETECTED Final   Coronavirus OC43 NOT DETECTED NOT DETECTED Final   Metapneumovirus NOT DETECTED NOT DETECTED Final   Rhinovirus / Enterovirus NOT DETECTED NOT DETECTED Final   Influenza A NOT DETECTED NOT DETECTED Final   Influenza B NOT DETECTED NOT DETECTED Final   Parainfluenza Virus 1 NOT DETECTED NOT DETECTED Final   Parainfluenza Virus 2 NOT DETECTED NOT DETECTED Final   Parainfluenza Virus 3 NOT DETECTED NOT DETECTED Final   Parainfluenza Virus 4 NOT DETECTED NOT DETECTED Final   Respiratory Syncytial Virus NOT DETECTED NOT  DETECTED Final   Bordetella pertussis NOT DETECTED NOT DETECTED Final   Chlamydophila pneumoniae NOT DETECTED NOT DETECTED Final   Mycoplasma pneumoniae NOT DETECTED NOT DETECTED Final    Comment: Performed at [facility], [place]. [address] [place], [place] [address]  MRSA PCR Screening     Status: None   Collection Time: 02/16/19  5:21 AM   Specimen: Nasal Mucosa; Nasopharyngeal  Result Value Ref Range Status   MRSA by PCR NEGATIVE NEGATIVE Final    Comment:        The GeneXpert MRSA Assay (FDA approved for NASAL specimens only), is one component of a comprehensive MRSA colonization surveillance program. It is not intended to diagnose MRSA infection nor to guide or monitor treatment for MRSA infections. Performed at [facility], [place] [address], [place], [place] [address]   Culture, bal-quantitative     Status: None   Collection Time: 02/18/19  4:55 PM   Specimen: Bronchoalveolar Lavage; Respiratory  Result Value Ref Range Status   Specimen Description BRONCHIAL ALVEOLAR LAVAGE  Final   Special Requests RIGHT MIDDLE LOBE  Final   Gram Stain   Final    FEW WBC PRESENT, PREDOMINANTLY MONONUCLEAR NO ORGANISMS SEEN    Culture   Final    NO GROWTH 2 DAYS Performed at [facility], [place] [address] [place], [place] [address]    Report Status 02/21/2019 FINAL  Final  Acid Fast Smear (AFB)     Status: None   Collection Time: 02/18/19  4:55 PM   Specimen: Bronchial Alveolar Lavage  Result Value Ref Range Status   AFB Specimen Processing Concentration  Final   Acid Fast Smear Negative  Final    Comment: (NOTE) Performed At: [facility] [address] [place], [place] [id number] Rush Farmer MD [id number]  Source (AFB) BRONCHIAL ALVEOLAR LAVAGE  Final    Comment: Performed at [facility], [place] [address] [place], [place] [address]  Fungus Culture With Stain     Status: None (Preliminary result)   Collection Time: 02/18/19  4:55 PM    Specimen: Bronchial Alveolar Lavage  Result Value Ref Range Status   Fungus Stain Final report  Final    Comment: (NOTE) Performed At: [facility] [place], [place] [id number] Rush Farmer MD [id number]    Fungus (Mycology) Culture PENDING  Incomplete   Fungal Source BRONCHIAL ALVEOLAR LAVAGE  Final    Comment: Performed at [facility], [place] [address] [place], [place] [address]  Fungus Culture Result     Status: None   Collection Time: 02/18/19  4:55 PM  Result Value Ref Range Status   Result 1 Comment  Final    Comment: (NOTE) KOH/Calcofluor preparation:  no fungus observed. Performed At: [facility] [place], [place] [id number] Rush Farmer MD [id number]   Respiratory Panel by PCR     Status: None   Collection Time: 02/18/19  9:00 PM   Specimen: Bronchial Alveolar Lavage; Respiratory  Result Value Ref Range Status   Adenovirus NOT DETECTED NOT DETECTED Final   Coronavirus 229E NOT DETECTED NOT DETECTED Final    Comment: (NOTE) The Coronavirus on the Respiratory Panel, DOES NOT test for the novel  Coronavirus (2019 nCoV)    Coronavirus HKU1 NOT DETECTED NOT DETECTED Final   Coronavirus NL63 NOT DETECTED NOT DETECTED Final   Coronavirus OC43 NOT DETECTED NOT DETECTED Final   Metapneumovirus NOT DETECTED NOT DETECTED Final   Rhinovirus / Enterovirus NOT DETECTED NOT DETECTED Final   Influenza A NOT DETECTED NOT DETECTED Final   Influenza B NOT DETECTED NOT DETECTED Final   Parainfluenza Virus 1 NOT DETECTED NOT DETECTED Final   Parainfluenza Virus 2 NOT DETECTED NOT DETECTED Final   Parainfluenza Virus 3 NOT DETECTED NOT DETECTED Final   Parainfluenza Virus 4 NOT DETECTED NOT DETECTED Final   Respiratory Syncytial Virus NOT DETECTED NOT DETECTED Final   Bordetella pertussis NOT DETECTED NOT DETECTED Final   Chlamydophila pneumoniae NOT DETECTED NOT DETECTED Final   Mycoplasma pneumoniae NOT DETECTED NOT  DETECTED Final    Comment: Performed at [facility], [place] [address] [place], [place] [address]  Culture, group A strep     Status: None   Collection Time: 02/20/19  4:13 PM   Specimen: Throat  Result Value Ref Range Status   Specimen Description THROAT  Final   Special Requests NONE  Final   Culture   Final    NO GROUP A STREP (S.PYOGENES) ISOLATED Performed at [facility], [place] [address] [place], [place] [address]    Report Status 02/22/2019 FINAL  Final  Group A Strep by PCR     Status: None   Collection Time: 02/21/19 11:17 AM   Specimen: Throat; Sterile Swab  Result Value Ref Range Status   Group A Strep by PCR NOT DETECTED NOT DETECTED Final    Comment: Performed at [facility], [place] [address], [place], [place] [address]         Radiology Studies: No results found.      Scheduled Meds: . allopurinol  100 mg Oral BID  . amLODipine  2.5 mg Oral Daily  . enoxaparin (LOVENOX) injection  40 mg Subcutaneous Q0600  . ferrous sulfate  325 mg  Oral Q breakfast  . furosemide  40 mg Oral Daily  . ipratropium-albuterol  3 mL Nebulization TID  . magic mouthwash w/lidocaine  5 mL Oral TID  . mouth rinse  15 mL Mouth Rinse BID  . nystatin  5 mL Oral QID  . pantoprazole  40 mg Oral BID  . predniSONE  60 mg Oral Q breakfast   Followed by  . [START ON 03/24/2019] predniSONE  40 mg Oral Q breakfast   Followed by  . [START ON 04/23/2019] predniSONE  20 mg Oral Q breakfast  . sertraline  200 mg Oral q morning - 10a  . sulfamethoxazole-trimethoprim  1 tablet Oral Once per day on Mon Wed Fri   Continuous Infusions: . sodium chloride 10 mL/hr at 02/16/19 1700     LOS: 6 days    Time spent: 35 minutes.     Elmarie Shiley, MD [facility]   If 7PM-7AM, please contact night-coverage  02/22/2019, 1:02 PM

## 2019-02-22 NOTE — Progress Notes (Signed)
Patient refusing CPAP for tonight, stating his throat is very sore and he would like to not wear it tonight. Explained to patient if he at any time changed his mind, he could just have them call and we would come place him on CPAP for the night.

## 2019-02-22 NOTE — Progress Notes (Addendum)
[facility] Infectious Disease  Date of Admission:  02/15/2019      Total days of antibiotics 6  Cefepime through 1/31           ASSESSMENT: Kurt [name] is a 69 y.o. male s/p COVID pneumonia now with bilateral cavitary lung findings. He has undergone a bronch/BAL for sputum sampling. No growth from bronch sample but he did have few pseudomonas recovered from an expectorated sample on 02/16/19. He does not describe a productive cough only ongoing shortness of breath with activity. CT scan on 1/26 revealed worsened b/l ground glass airspace opacities.   Likely his primary issue seems to be new interstitial lung disease d/t COVID19 pneumonia, however given his previous 3 admissions with worsening hypoxia and opacities on imaging, will elect to continue treatment for pseudomonas pneumonia. Will resume the cefepime and continue during hospitalization for now. Would plan to discharge on levaquin or cipro to complete treatment through February 9th.   Unclear the etiology of sore throat. No thrush on exam s/p 2 doses of fluconazole. GAS swab negative. ?dry related to CPAP use vs irritation from recent upper airway scope.    PLAN:  1. Resume cefepime during hospitalization 2. Plan to treat through February 7th - can change to flouroquinolone at discharge should he not be CIR candidate.   Will sign off - follow up on BAL and any remaining studies with pulmonology. Please call with questions or changes to condition.    Principal Problem:   Cavitary pneumonia Active Problems:   Multifocal pneumonia   Hypertension   History of COVID-19   Acute respiratory failure with hypoxia (HCC)   CHF (congestive heart failure) (HCC)   Acute on chronic respiratory failure (HCC)   Diarrhea   Respiratory failure, acute ([place])   . allopurinol  100 mg Oral BID  . amLODipine  2.5 mg Oral Daily  . ciprofloxacin  750 mg Oral BID  . enoxaparin (LOVENOX) injection  40 mg Subcutaneous Q0600   . ferrous sulfate  325 mg Oral Q breakfast  . furosemide  40 mg Oral Daily  . ipratropium-albuterol  3 mL Nebulization TID  . magic mouthwash w/lidocaine  5 mL Oral TID  . mouth rinse  15 mL Mouth Rinse BID  . nystatin  5 mL Oral QID  . pantoprazole  40 mg Oral BID  . predniSONE  60 mg Oral Q breakfast   Followed by  . [START ON 03/24/2019] predniSONE  40 mg Oral Q breakfast   Followed by  . [START ON 04/23/2019] predniSONE  20 mg Oral Q breakfast  . sertraline  200 mg Oral q morning - 10a  . sulfamethoxazole-trimethoprim  1 tablet Oral Once per day on Mon Wed Fri    SUBJECTIVE: Feeling better about the plan. He is still on high amounts of oxygen with difficulty tolerating much movement. Did ambulate out in the hallway however yesterday for the first time.  Hopeful that he can get into CIR. Had a few days of a "purulent sore throat". Swab was negative for strep infection. He has been using his CPAP at night for his previously undiagnosed sleep apnea.   Remains afebrile. Normal WBC. Started on steroids for long taper and bactrim 1 ds tab daily by pulmonology team. He has follow up arranged 2/8 with pulmonology team.    Review of Systems: Review of Systems  Constitutional: Negative for chills and fever.  HENT: Negative for tinnitus.  Eyes: Negative for blurred vision and photophobia.  Respiratory: Positive for shortness of breath. Negative for cough and sputum production.   Cardiovascular: Negative for chest pain.  Gastrointestinal: Negative for diarrhea, nausea and vomiting.  Genitourinary: Negative for dysuria.  Skin: Negative for rash.  Neurological: Negative for headaches.    No Known Allergies  OBJECTIVE: Vitals:   02/22/19 0903 02/22/19 0916 02/22/19 1336 02/22/19 1430  BP:  111/67 122/68   Pulse:   98   Resp:   20   Temp:   98.7 F (37.1 C)   TempSrc:   Oral   SpO2: 95%  97% 97%  Weight:      Height:       Body mass index is 37.58 kg/m.  Physical Exam Vitals  and nursing note reviewed.  Constitutional:      Appearance: He is well-developed.     Comments: Seated comfortably in recliner  HENT:     Mouth/Throat:     Dentition: Normal dentition. No dental abscesses.  Cardiovascular:     Rate and Rhythm: Normal rate and regular rhythm.     Heart sounds: Normal heart sounds.  Pulmonary:     Effort: Pulmonary effort is normal. No respiratory distress.     Breath sounds: No decreased breath sounds, wheezing, rhonchi or rales.     Comments: Oxygen via nasal cannula Abdominal:     General: There is no distension.     Palpations: Abdomen is soft.     Tenderness: There is no abdominal tenderness.  Lymphadenopathy:     Cervical: No cervical adenopathy.  Skin:    General: Skin is warm and dry.     Findings: No rash.  Neurological:     Mental Status: He is alert and oriented to person, place, and time.  Psychiatric:        Judgment: Judgment normal.     Lab Results Lab Results  Component Value Date   WBC 8.1 02/20/2019   HGB 11.4 (L) 02/20/2019   HCT 36.3 (L) 02/20/2019   MCV 86.6 02/20/2019   PLT 500 (H) 02/20/2019    Lab Results  Component Value Date   CREATININE 1.05 02/22/2019   BUN 22 02/22/2019   NA 138 02/22/2019   K 4.7 02/22/2019   CL 98 02/22/2019   CO2 29 02/22/2019    Lab Results  Component Value Date   ALT 26 02/15/2019   AST 25 02/15/2019   ALKPHOS 67 02/15/2019   BILITOT 0.7 02/15/2019     Microbiology: Recent Results (from the past 240 hour(s))  Blood Culture (routine x 2)     Status: None   Collection Time: 02/15/19 11:04 PM   Specimen: BLOOD RIGHT HAND  Result Value Ref Range Status   Specimen Description BLOOD RIGHT HAND  Final   Special Requests   Final    BOTTLES DRAWN AEROBIC AND ANAEROBIC Blood Culture results may not be optimal due to an inadequate volume of blood received in culture bottles   Culture   Final    NO GROWTH 5 DAYS Performed at [facility], [place] [address], [place],  [place] [address]    Report Status 02/20/2019 FINAL  Final  Expectorated sputum assessment w rflx to resp cult     Status: None   Collection Time: 02/16/19 12:02 AM   Specimen: Sputum  Result Value Ref Range Status   Specimen Description SPUTUM  Final   Special Requests NONE  Final   Sputum evaluation  Final    THIS SPECIMEN IS ACCEPTABLE FOR SPUTUM CULTURE Performed at [facility], [place] [address], [place], [place] [address]    Report Status 02/20/2019 FINAL  Final  Culture, respiratory     Status: None   Collection Time: 02/16/19 12:02 AM   Specimen: SPU  Result Value Ref Range Status   Specimen Description SPUTUM  Final   Special Requests NONE Reflexed from [id number]  Final   Gram Stain   Final    MODERATE WBC PRESENT, PREDOMINANTLY PMN FEW SQUAMOUS EPITHELIAL CELLS PRESENT ABUNDANT GRAM POSITIVE COCCI MODERATE GRAM NEGATIVE COCCOBACILLI FEW GRAM NEGATIVE RODS FEW GRAM POSITIVE RODS Performed at [facility], [place] [address], [place], [place] [address]    Culture FEW PSEUDOMONAS AERUGINOSA  Final   Report Status 02/22/2019 FINAL  Final   Organism ID, Bacteria PSEUDOMONAS AERUGINOSA  Final      Susceptibility   Pseudomonas aeruginosa - MIC*    CEFTAZIDIME <=1 SENSITIVE Sensitive     CIPROFLOXACIN <=0.25 SENSITIVE Sensitive     GENTAMICIN <=1 SENSITIVE Sensitive     IMIPENEM <=0.25 SENSITIVE Sensitive     PIP/TAZO <=4 SENSITIVE Sensitive     * FEW PSEUDOMONAS AERUGINOSA  Blood Culture (routine x 2)     Status: None   Collection Time: 02/16/19  1:30 AM   Specimen: BLOOD LEFT ARM  Result Value Ref Range Status   Specimen Description BLOOD LEFT ARM  Final   Special Requests   Final    BOTTLES DRAWN AEROBIC AND ANAEROBIC Blood Culture adequate volume   Culture   Final    NO GROWTH 5 DAYS Performed at [facility], [place] [address], [place], [place] [address]    Report Status 02/21/2019 FINAL  Final  Urine culture     Status: None   Collection Time: 02/16/19  3:05  AM   Specimen: In/Out Cath Urine  Result Value Ref Range Status   Specimen Description IN/OUT CATH URINE  Final   Special Requests NONE  Final   Culture   Final    NO GROWTH Performed at [facility], [place] [address], [place], [place] [address]    Report Status 02/16/2019 FINAL  Final  SARS CORONAVIRUS 2 (TAT 6-24 HRS) Nasopharyngeal Nasal Mucosa     Status: None   Collection Time: 02/16/19  3:30 AM   Specimen: Nasal Mucosa; Nasopharyngeal  Result Value Ref Range Status   SARS Coronavirus 2 NEGATIVE NEGATIVE Final    Comment: (NOTE) SARS-CoV-2 target nucleic acids are NOT DETECTED. The SARS-CoV-2 RNA is generally detectable in upper and lower respiratory specimens during the acute phase of infection. Negative results do not preclude SARS-CoV-2 infection, do not rule out co-infections with other pathogens, and should not be used as the sole basis for treatment or other patient management decisions. Negative results must be combined with clinical observations, patient history, and epidemiological information. The expected result is Negative. Fact Sheet for Patients: SugarRoll.be Fact Sheet for Healthcare Providers: https://www.woods-mathews.com/ This test is not yet approved or cleared by the [place] FDA and  has been authorized for detection and/or diagnosis of SARS-CoV-2 by FDA under an Emergency Use Authorization (EUA). This EUA will remain  in effect (meaning this test can be used) for the duration of the COVID-19 declaration under Section 56 4(b)(1) of the Act, 21 U.S.C. section 360bbb-3(b)(1), unless the authorization is terminated or revoked sooner. Performed at [facility], [place],  [place] [address]   Respiratory Panel by PCR     Status: None   Collection Time: 02/16/19  4:05 AM   Specimen: Flu Kit Nasopharyngeal Swab; Respiratory  Result Value Ref Range Status   Adenovirus NOT DETECTED NOT  DETECTED Final   Coronavirus 229E NOT DETECTED NOT DETECTED Final    Comment: (NOTE) The Coronavirus on the Respiratory Panel, DOES NOT test for the novel  Coronavirus (2019 nCoV)    Coronavirus HKU1 NOT DETECTED NOT DETECTED Final   Coronavirus NL63 NOT DETECTED NOT DETECTED Final   Coronavirus OC43 NOT DETECTED NOT DETECTED Final   Metapneumovirus NOT DETECTED NOT DETECTED Final   Rhinovirus / Enterovirus NOT DETECTED NOT DETECTED Final   Influenza A NOT DETECTED NOT DETECTED Final   Influenza B NOT DETECTED NOT DETECTED Final   Parainfluenza Virus 1 NOT DETECTED NOT DETECTED Final   Parainfluenza Virus 2 NOT DETECTED NOT DETECTED Final   Parainfluenza Virus 3 NOT DETECTED NOT DETECTED Final   Parainfluenza Virus 4 NOT DETECTED NOT DETECTED Final   Respiratory Syncytial Virus NOT DETECTED NOT DETECTED Final   Bordetella pertussis NOT DETECTED NOT DETECTED Final   Chlamydophila pneumoniae NOT DETECTED NOT DETECTED Final   Mycoplasma pneumoniae NOT DETECTED NOT DETECTED Final    Comment: Performed at [facility], [place]. [address] [place], [place] [address]  MRSA PCR Screening     Status: None   Collection Time: 02/16/19  5:21 AM   Specimen: Nasal Mucosa; Nasopharyngeal  Result Value Ref Range Status   MRSA by PCR NEGATIVE NEGATIVE Final    Comment:        The GeneXpert MRSA Assay (FDA approved for NASAL specimens only), is one component of a comprehensive MRSA colonization surveillance program. It is not intended to diagnose MRSA infection nor to guide or monitor treatment for MRSA infections. Performed at [facility], [place] [address], [place], [place] [address]   Culture, bal-quantitative     Status: None   Collection Time: 02/18/19  4:55 PM   Specimen: Bronchoalveolar Lavage; Respiratory  Result Value Ref Range Status   Specimen Description BRONCHIAL ALVEOLAR LAVAGE  Final   Special Requests RIGHT MIDDLE LOBE  Final   Gram Stain   Final    FEW WBC PRESENT,  PREDOMINANTLY MONONUCLEAR NO ORGANISMS SEEN    Culture   Final    NO GROWTH 2 DAYS Performed at [facility], [place] [address], [place], [place] [address]    Report Status 02/21/2019 FINAL  Final  Acid Fast Smear (AFB)     Status: None   Collection Time: 02/18/19  4:55 PM   Specimen: Bronchial Alveolar Lavage  Result Value Ref Range Status   AFB Specimen Processing Concentration  Final   Acid Fast Smear Negative  Final    Comment: (NOTE) Performed At: [facility] [place], [place] [id number] Rush Farmer MD [id number]    Source (AFB) BRONCHIAL ALVEOLAR LAVAGE  Final    Comment: Performed at [facility], [place] [address] [place], [place] [address]  Fungus Culture With Stain     Status: None (Preliminary result)   Collection Time: 02/18/19  4:55 PM   Specimen: Bronchial Alveolar Lavage  Result Value Ref Range Status   Fungus Stain Final report  Final    Comment: (NOTE) Performed At: [facility] [place], [place] [id number] Rush Farmer MD KW:[id number]    Fungus (Mycology) Culture PENDING  Incomplete  Fungal Source BRONCHIAL ALVEOLAR LAVAGE  Final    Comment: Performed at [facility], [place] [address] [place], [place] [address]  Fungus Culture Result     Status: None   Collection Time: 02/18/19  4:55 PM  Result Value Ref Range Status   Result 1 Comment  Final    Comment: (NOTE) KOH/Calcofluor preparation:  no fungus observed. Performed At: [facility] [place], [place] [id number] Rush Farmer MD YC:[id number]   Respiratory Panel by PCR     Status: None   Collection Time: 02/18/19  9:00 PM   Specimen: Bronchial Alveolar Lavage; Respiratory  Result Value Ref Range Status   Adenovirus NOT DETECTED NOT DETECTED Final   Coronavirus 229E NOT DETECTED NOT DETECTED Final    Comment: (NOTE) The Coronavirus on the Respiratory Panel, DOES NOT test for the novel  Coronavirus (2019  nCoV)    Coronavirus HKU1 NOT DETECTED NOT DETECTED Final   Coronavirus NL63 NOT DETECTED NOT DETECTED Final   Coronavirus OC43 NOT DETECTED NOT DETECTED Final   Metapneumovirus NOT DETECTED NOT DETECTED Final   Rhinovirus / Enterovirus NOT DETECTED NOT DETECTED Final   Influenza A NOT DETECTED NOT DETECTED Final   Influenza B NOT DETECTED NOT DETECTED Final   Parainfluenza Virus 1 NOT DETECTED NOT DETECTED Final   Parainfluenza Virus 2 NOT DETECTED NOT DETECTED Final   Parainfluenza Virus 3 NOT DETECTED NOT DETECTED Final   Parainfluenza Virus 4 NOT DETECTED NOT DETECTED Final   Respiratory Syncytial Virus NOT DETECTED NOT DETECTED Final   Bordetella pertussis NOT DETECTED NOT DETECTED Final   Chlamydophila pneumoniae NOT DETECTED NOT DETECTED Final   Mycoplasma pneumoniae NOT DETECTED NOT DETECTED Final    Comment: Performed at [facility], [place] [address] [place], [place] [address]  Culture, group A strep     Status: None   Collection Time: 02/20/19  4:13 PM   Specimen: Throat  Result Value Ref Range Status   Specimen Description THROAT  Final   Special Requests NONE  Final   Culture   Final    NO GROUP A STREP (S.PYOGENES) ISOLATED Performed at [facility], [place] [address], [place], [place] [address]    Report Status 02/22/2019 FINAL  Final  Group A Strep by PCR     Status: None   Collection Time: 02/21/19 11:17 AM   Specimen: Throat; Sterile Swab  Result Value Ref Range Status   Group A Strep by PCR NOT DETECTED NOT DETECTED Final    Comment: Performed at [facility], [place] [address] [place], [place] [address]    Janene Madeira, MSN, NP-C [facility] Infectious Disease [email or web address] Pager: [phone] Office: [phone] [place]: [phone]

## 2019-02-22 NOTE — Progress Notes (Addendum)
NAME:  Kurt [name], MRN:  [id number], DOB:  01/26/1950, LOS: 6 ADMISSION DATE:  02/15/2019, CONSULTATION DATE:  02/16/2019 REFERRING MD:  Danielle Rankin, CHIEF COMPLAINT: Recurrent  Pneumonia   Brief History   Kurt [name] is a 69 y.o. male with medical history significant of chronic diastolic congestive heart failure, hypertension, CKD stage III, recent COVID pneumonia (12/6-12/16/ 2020) and recurrent pneumonia requiring admission 12/28-01/23/2019, presenting again to the ED on 02/15/2019  for evaluation of shortness of breath. He has had CXR in ED which was + for MF pneumonia. CT angiogram chest negative for PE. Showed  worsening extensive bilateral groundglass airspace opacities concerning for multifocal pneumonia.  18 mm nodule in the right middle lobe, nodule was 21 mm on prior study and partially cavitary.The pulmonary service  has been asked to consult for recurrent pneumonia post COVID.  History of present illness   Kurt [name] is a 69 y.o. male never smoker with medical history significant of chronic diastolic congestive heart failure, hypertension, CKD stage III, recent COVID pneumonia presenting to the ED on 1/26  for evaluation of shortness of breath and increasing oxygen needs.  Patient states he was diagnosed with COVID-19 initially around Thanksgiving time last year.  Since then he has been admitted twice.  He was discharged home on 2 L supplemental oxygen but continues to feel very dyspneic even with minimal ambulation.  His oxygen requirement has gone up to 6 L.  Now even on 6 L supplemental oxygen, his oxygen saturation drops to the 40s with minimal ambulation at home.  He is feeling very weak and fell a few days ago.  It took him 45 minutes to get up from the floor.  No loss of consciousness or head injury.  His left hip was slightly sore at the time he fell but the pain has now resolved and he is able to walk without any difficulty.  His temperature has been in the 98 to 100F range at home.   States he has never had lung problems previously and is a never smoker.  He has been taking steroids for several weeks now with no improvement in his breathing.  Denies hemoptysis.  He also recently finished a 3-week course of Augmentin.  Reports having green-colored diarrhea and intermittent abdominal cramps for the past 1 week.  Denies nausea or vomiting.  Denies melena or hematochezia.  He is not on any blood thinners and does not use any over-the-counter NSAIDs.  Patient was admitted to the hospital from 12/6-12/16 for acute hypoxic respiratory failure secondary to COVID-19 pneumonia.  He was treated with steroids and remdesivir.  Discharged home on 2 L supplemental oxygen.  Admitted again on 12/28-1/2 for worsening dyspnea and hypoxia.  CT angiogram done during this hospitalization negative for PE but did show 2 cavitary nodules in both upper lobes measuring up to 2.5 cm concerning for bacterial or fungal pneumonia, septic pulmonary emboli thought to be less likely.  His presentation was thought to be due to respiratory sequela from Covid and chronic sinusitis infection.  Treated with Augmentin x 3 weeks for chronic sinusitis and a slow steroid taper for sequela of Covid pneumonia.  ED Course: Febrile with T-max 100.5 F.  Slightly tachycardic.  Tachypneic with respiratory rate up to 30s.  Not hypotensive.  Oxygen saturation in the low 90s on 6 L supplemental oxygen.  WBC count 11.3.  Lactic acid level normal.  Procalcitonin level <0.10.  High-sensitivity troponin x2 negative.  BNP pending.  MRSA PCR screen pending.  Sputum Gram stain with abundant gram-positive cocci, moderate gram-negative cocci bacilli, and few gram-negative rods.  Sputum culture pending.  UA and urine culture pending.  Blood culture x 2 pending, C diff pending. CRP 11.9, D-dimer 1.10, LDH 248, Flu A& B negative, WBC is 11.3, Fibrinogen 671, Ferritin 131, QuantiFERON TB is pending Chest x-ray personally reviewed showing diffuse  interstitial prominence and patchy bilateral airspace disease similar to prior study, findings concerning for multifocal pneumonia. CT angiogram chest negative for PE.  Showing worsening extensive bilateral groundglass airspace opacities concerning for multifocal pneumonia.  18 mm nodule in the right middle lobe, nodule was 21 mm on prior study and partially cavitary.  Mildly enlarged right paratracheal lymph nodes, stable. ED provider discussed the case with infectious disease, will consult in a.m. Recommended treating with cefepime and linezolid for HCAP. Patient received Tylenol, Percocet, cefepime, linezolid, and a 500 cc normal saline bolus.    Past Medical History    Past Medical History:  Diagnosis Date  . Arthritis   . CHF (congestive heart failure) (HCC)    diastolic dysfunction  . Chronic back pain   . COVID-19   . Depression   . Diverticulitis   . GI bleed 05/2018  . Hypertension   . PTSD (post-traumatic stress disorder)    after  a Fall   . Renal insufficiency    Decrease in GFR- PCP removed all NSAIDS & aspirin     Significant Hospital Events   November 2020>>COVID +  Admission 12/6-12/16/ 2020  Admission 12/28-01/23/2019 Admission 02/16/2019>>  Consults:  02/16/2019>> Pulmonary 02/16/2019>> ID  Procedures:    Significant Diagnostic Tests:  02/16/2019>> CTA Worsening extensive bilateral ground-glass airspace opacities most compatible with pneumonia. 18 mm nodule in the right middle lobe. This nodule was 21 mm on prior study and partially cavitary. Mildly enlarged right paratracheal lymph nodes, stable. No evidence of pulmonary embolus. Cardiomegaly.  02/15/2019 CXR Diffuse interstitial prominence and patchy bilateral airspace disease, similar to prior study. Findings concerning for multifocal pneumonia  Micro Data:  02/16/2019 Sputum: GS: abundant gram-positive cocci, moderate gram-negative cocci bacilli, and few gram-negative rods.   Culture:>>pending 1/26 COVID>> Negative 1/26 Respiratory Viral Panel >> Negative  Antimicrobials:  Maxipime 02/15/2019>>> Linezolid 02/15/2019>>   Interim history/subjective:  Loquacious this morning. Very upset at his care at [facility]. Perseverating on prior workup. Otherwise doing okay, has some sore throat and was told he had white stuff in back of throat yesterday.  Objective   Blood pressure 136/68, pulse 70, temperature 98.5 F (36.9 C), temperature source Oral, resp. rate 19, height 5' 10.5" (1.791 m), weight 120.5 kg, SpO2 100 %.        Intake/Output Summary (Last 24 hours) at 02/22/2019 0826 Last data filed at 02/21/2019 2129 Gross per 24 hour  Intake 200 ml  Output 1120 ml  Net -920 ml   Filed Weights   02/20/19 0502 02/21/19 0623 02/22/19 0644  Weight: 120.2 kg 120.2 kg 120.5 kg   Physical Exam: GEN: elderly man in NAD HEENT: irritated posterior airway, do not see any remaining thrush CV: RRR, ext warm PULM: some crackles at bases, no accessory muscle use GI: Soft, +BS EXT: No edema NEURO: Moves all 4 ext to command PSYCH: very talkative, AOx3 SKIN: No rashes   Resolved Hospital Problem list     Assessment & Plan:   # Post COVID ILD- history, labs, bronch, imaging c/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  Elevated inflammatory markers and lymphocytic BAL.  This  has been complicated by severe musculardeconditioning from prolonged hospitalization.  # RML lesion- warrants OP f/u in around 4 weeks, potential biopsy down the line.  AFB/fungal cultures negative to date.  # Likely oropharyngeal thrush- worsened by CPAP  # OSA not on CPAP- wearing to some effect here, no immediate impact on morbidity.  Only real effect is that his O2 will be titrated up at night if he does not wear the CPAP.  Addendum: pseudomonas on sputum: cefepime vs. cipro fine x 7 days - DC fluconazole - Do standing magic mouthwash plus nystatin (I understand both have nystatin, this dose is harmless) - Encourage  IS - Prednisone 78m/day x 1 mo then drop to 40 for a month then drop to 20 for a month - Bactrim DS MWF for PJP ppx, check BMP around once a week to make sure K/Cr can handle this - CT chest around 2/26 for RML nodule f/u - f/u remaining bronch cultures - Encourage PT, OOB, increase O2 as needed to maximize mobility comfort - CPAP at night; if patient wants a break from this I would D/C night-time oximetry, his O2 will go down with apneic events which would lead to inappropriate uptitration of night-time O2, patient informed of this - Will follow with you  DErskine EmeryMD PCCM

## 2019-02-22 NOTE — PMR Pre-admission (Signed)
PMR Admission Coordinator Pre-Admission Assessment  Patient: Kurt [name] is an 69 y.o., male MRN: [id number] DOB: 03/05/1950 Height: 5' 10.5" (179.1 cm) Weight: 120.1 kg  Insurance Information HMO:     PPO:  yes    PCP:      IPA:      80/20:      OTHER:  PRIMARY: Blue Medicare      Policy#: [id number]      Subscriber: pt CM Name: Shanon      Phone#: [phone]     Fax#: [phone] Pre-Cert#: TBD approved for 7 days      Employer:  Benefits:  Phone #: [phone]     Name: 02/22/2019 Eff. Date: 01/22/2019     Deduct: none      Out of Pocket Max: $5900      Life Max: none CIR: #335 co pay per day days 1 until 6      SNF: no co pay per day days 1 until 20; $184 co pay per day days 21 until 60; no co pay days 61- 100 Outpatient: $40 per visit     Co-Pay: visits per medical neccesity Home Health: 100%      Co-Pay: visits per medical neccesity DME: 80%     Co-Pay: 20% Providers: in network   SECONDARY: none        Medicaid Application Date:       Case Manager:  Disability Application Date:       Case Worker:   The "Data Collection Information Summary" for patients in Inpatient Rehabilitation Facilities with attached "Privacy Act [place] Records" was provided and verbally reviewed with: Patient  Emergency Contact Information Contact Information    Name Relation Home Work Mobile   Greydon, Betke   [phone]   Turitt,Stacy Daughter [phone]  [phone]   Ellis,Crystal Daughter [phone]  [phone]   SEYDOU, HEARNS   [phone]      Current Medical History  Patient Admitting Diagnosis: Debility , PNA with history of COVID 19  History of Present Illness: 69 year old male with history of Chronic diastolic congestive heart failure, HTN, CKD stage III, recent COVID PNA presenting to ED on 02/16/2019 for evaluation of SOB. He was initially dx COVID around Thanksgiving and has been readmitted twice . On O2 at home since COVID, but felt very  dyspneic with minimal exertion. Oxygen requirements up to 6 liter with desaturation to 40's at home.   Last admit 12/6 until 12/16 for acute respiratory failure treated with steroids and Remdesivir and then second admit 12/28 until 01/23/2019 for worsening hypoxia and dyspnea. Admits felt due to respiratory sequela from COVID and chronic sinus infection. Treated for 3 weeks with Augmentin and slow steroid taper. CT angio negative for PE but showed bilateral ground glass opacity.    CT angio worsening of bilateral glass opacities most compatible with PNA. 18 mm nodule RML. Treated initially with Cefepime and linezolid. Blood cultures negative to date. Underwent Bronchoscopy 1/28. No aspiration on swallow eval. Patient still desats with exertion. Sputum culture form 1/26 grew few gram negative rods. Culture incubated and resumed IV Cefepime. ON prednisone 60 mg daily and will need longer taper ts. On scheduled nebulizer.      Started fluconazole with sore throat. D/c'd by pulmonary. added mouthwash and lidocaine for sore throat.   Patient's medical record from [facility] has been reviewed by the rehabilitation admission coordinator and physician.  Past Medical History  Past Medical History:  Diagnosis  Date  . Arthritis   . CHF (congestive heart failure) (HCC)    diastolic dysfunction  . Chronic back pain   . COVID-19   . Depression   . Diverticulitis   . GI bleed 05/2018  . Hypertension   . PTSD (post-traumatic stress disorder)    after  a Fall   . Renal insufficiency    Decrease in GFR- PCP removed all NSAIDS & aspirin     Family History   family history is not on file.  Prior Rehab/Hospitalizations Has the patient had prior rehab or hospitalizations prior to admission? Yes  Has the patient had major surgery during 100 days prior to admission? No   Current Medications  Current Facility-Administered Medications:  .  0.9 %  sodium chloride infusion, , Intravenous, PRN,  Regalado, Belkys A, MD, Last Rate: 10 mL/hr at 02/23/19 0626, 250 mL at 02/23/19 0626 .  acetaminophen (TYLENOL) tablet 650 mg, 650 mg, Oral, Q6H PRN, Shela Leff, MD, 650 mg at 02/20/19 2213 .  albuterol (PROVENTIL) (2.5 MG/3ML) 0.083% nebulizer solution 3 mL, 3 mL, Inhalation, Q4H PRN, Shela Leff, MD .  allopurinol (ZYLOPRIM) tablet 100 mg, 100 mg, Oral, BID, Regalado, Belkys A, MD, 100 mg at 02/23/19 0811 .  amLODipine (NORVASC) tablet 2.5 mg, 2.5 mg, Oral, Daily, Regalado, Belkys A, MD, 2.5 mg at 02/23/19 0811 .  ceFEPIme (MAXIPIME) 2 g in sodium chloride 0.9 % 100 mL IVPB, 2 g, Intravenous, Q8H, Panas, Stephanie N, NP, Last Rate: 200 mL/hr at 02/23/19 0627, 2 g at 02/23/19 0627 .  chlorpheniramine-HYDROcodone (TUSSIONEX) 10-8 MG/5ML suspension 5 mL, 5 mL, Oral, Q12H PRN, Shela Leff, MD .  enoxaparin (LOVENOX) injection 40 mg, 40 mg, Subcutaneous, Q0600, Margaretha Seeds, MD, 40 mg at 02/23/19 1937 .  ferrous sulfate tablet 325 mg, 325 mg, Oral, Q breakfast, Regalado, Belkys A, MD, 325 mg at 02/23/19 0811 .  furosemide (LASIX) tablet 40 mg, 40 mg, Oral, Daily, Regalado, Belkys A, MD, 40 mg at 02/23/19 0811 .  guaiFENesin-dextromethorphan (ROBITUSSIN DM) 100-10 MG/5ML syrup 10 mL, 10 mL, Oral, Q4H PRN, Shela Leff, MD .  HYDROcodone-acetaminophen (NORCO/VICODIN) 5-325 MG per tablet 1-2 tablet, 1-2 tablet, Oral, Q6H PRN, Bodenheimer, Charles A, NP, 2 tablet at 02/22/19 2212 .  ipratropium-albuterol (DUONEB) 0.5-2.5 (3) MG/3ML nebulizer solution 3 mL, 3 mL, Nebulization, TID, Regalado, Belkys A, MD, 3 mL at 02/23/19 0732 .  ketorolac (TORADOL) 15 MG/ML injection 15 mg, 15 mg, Intravenous, Q8H PRN, Regalado, Belkys A, MD, 15 mg at 02/22/19 2213 .  lidocaine (XYLOCAINE) 4 % (PF) injection 4 mL, 4 mL, Inhalation, BID, Candee Furbish, MD .  magic mouthwash w/lidocaine, 5 mL, Oral, TID, Candee Furbish, MD, 5 mL at 02/23/19 [phone] .  MEDLINE mouth rinse, 15 mL, Mouth Rinse,  BID, Regalado, Belkys A, MD, 15 mL at 02/23/19 1000 .  menthol-cetylpyridinium (CEPACOL) lozenge 3 mg, 1 lozenge, Oral, TID, Candee Furbish, MD, 3 mg at 02/23/19 1000 .  montelukast (SINGULAIR) tablet 10 mg, 10 mg, Oral, QHS, Candee Furbish, MD .  nystatin (MYCOSTATIN) 100000 UNIT/ML suspension 500,000 Units, 5 mL, Oral, QID, Candee Furbish, MD, 500,000 Units at 02/23/19 1200 .  pantoprazole (PROTONIX) EC tablet 40 mg, 40 mg, Oral, BID, Regalado, Belkys A, MD, 40 mg at 02/23/19 0812 .  phenol (CHLORASEPTIC) mouth spray 1 spray, 1 spray, Mouth/Throat, PRN, Bodenheimer, Charles A, NP .  predniSONE (DELTASONE) tablet 60 mg, 60 mg, Oral, Q breakfast, 60 mg at 02/23/19  4627 **FOLLOWED BY** [START ON 03/24/2019] predniSONE (DELTASONE) tablet 40 mg, 40 mg, Oral, Q breakfast **FOLLOWED BY** [START ON 04/23/2019] predniSONE (DELTASONE) tablet 20 mg, 20 mg, Oral, Q breakfast, Candee Furbish, MD .  sertraline (ZOLOFT) tablet 200 mg, 200 mg, Oral, q morning - 10a, Regalado, Belkys A, MD, 200 mg at 02/23/19 0812 .  sodium chloride (OCEAN) 0.65 % nasal spray 1 spray, 1 spray, Each Nare, PRN, Regalado, Belkys A, MD, 1 spray at 02/22/19 1848 .  sulfamethoxazole-trimethoprim (BACTRIM DS) 800-160 MG per tablet 1 tablet, 1 tablet, Oral, Once per day on Mon Wed Fri, Candee Furbish, MD, 1 tablet at 02/22/19 0350  Patients Current Diet:  Diet Order            Diet Heart Room service appropriate? Yes; Fluid consistency: Thin  Diet effective now              Precautions / Restrictions Precautions Precautions: Fall Precaution Comments: monitor O2 ( used 6 liters for activity and 3 at rest) Restrictions Weight Bearing Restrictions: No   Has the patient had 2 or more falls or a fall with injury in the past year? Yes  Prior Activity Level Community (5-7x/wk): was completely Independent prior to COVID dx 11/20. retired, driving  Prior Functional Level Self Care: Did the patient need help bathing, dressing,  using the toilet or eating? Independent  Indoor Mobility: Did the patient need assistance with walking from room to room (with or without device)? Independent  Stairs: Did the patient need assistance with internal or external stairs (with or without device)? Independent  Functional Cognition: Did the patient need help planning regular tasks such as shopping or remembering to take medications? Independent  Home Assistive Devices / Equipment Home Assistive Devices/Equipment: Environmental consultant (specify type) Home Equipment: Shower seat, Environmental consultant - 4 wheels, Walker - 2 wheels, Cane - single point  Prior Device Use: Indicate devices/aids used by the patient prior to current illness, exacerbation or injury? None of the above  Current Functional Level Cognition  Overall Cognitive Status: Within Functional Limits for tasks assessed Orientation Level: Oriented X4 General Comments: very motivated to return to PLOF (active--going to water therapy 4 days a week prior to [place] in Nov 2020)    Extremity Assessment (includes Sensation/Coordination)  Upper Extremity Assessment: Generalized weakness RUE Deficits / Details: ROM WFL; MMT 5/5 except elbow 4/5 from chronic injury LUE Deficits / Details: ROM WFL; MMT shoulder and elbow 4/5 from chronic injury  Lower Extremity Assessment: Defer to PT evaluation    ADLs  Overall ADL's : Needs assistance/impaired Eating/Feeding: Independent Grooming: Wash/dry hands, Wash/dry face, Oral care, Supervision/safety, Standing Upper Body Bathing: Set up, Sitting Lower Body Bathing: Sit to/from stand, Minimal assistance Upper Body Dressing : Set up, Sitting Lower Body Dressing: Minimal assistance, Sitting/lateral leans Lower Body Dressing Details (indicate cue type and reason): education on LB dressing compensatory method of using figure four method to don pants as ECS Toilet Transfer: Stand-pivot, Copy Details (indicate cue type and reason):  bed<>recliner, no AD Toileting- Clothing Manipulation and Hygiene: Set up, Sitting/lateral lean Functional mobility during ADLs: Min guard, Minimal assistance, Rolling walker General ADL Comments: With use of AE (reacher and sock aid) pt with less SOB than without AE    Mobility  Overal bed mobility: (pt received and left in recliner) Bed Mobility: Supine to Sit Supine to sit: Supervision General bed mobility comments: pt up in recliner upon my arrival    Transfers  Overall  transfer level: Needs assistance Equipment used: None Transfers: Sit to/from Stand, Stand Pivot Transfers Sit to Stand: Supervision Stand pivot transfers: Supervision(management of lines) Squat pivot transfers: Min guard General transfer comment: Pt is able to do stand pivots at a S level however with just this activity he gets SOB even on 6 liters and sats dropped as low as 84% with education on purse lipped breathing to gets sats back up into mid 90's on 6 liters.    Ambulation / Gait / Stairs / Wheelchair Mobility  Ambulation/Gait Ambulation/Gait assistance: Counsellor (Feet): 60 Feet(x1 45x1) Assistive device: Rolling walker (2 wheeled) Gait Pattern/deviations: Step-through pattern, Decreased stride length, Wide base of support General Gait Details: O2 increased to 6LO2 via [place], pt maintain SPO2 >92% on 6LO2 during amb. Pt required 1 seated rest break but demo'd increased ambulation tolerance Gait velocity: dec Gait velocity interpretation: <1.31 ft/sec, indicative of household ambulator    Posture / Balance Dynamic Sitting Balance Sitting balance - Comments: modI Balance Overall balance assessment: Needs assistance Sitting-balance support: No upper extremity supported, Feet supported Sitting balance-Leahy Scale: Good Sitting balance - Comments: modI Standing balance support: Single extremity supported Standing balance-Leahy Scale: Fair Standing balance comment: dependent on UE support for  ambulation, pt okay with static standing    Special needs/care consideration BiPAP/CPAP  Needs outpt sleep study; new to CPAP CPM  Continuous Drip IV  Dialysis          Life Vest  Oxygen at 6 liters HF [place] with activity, 3 liters at rest Special Bed  Trach Size  Wound Vac Skin                                Bowel mgmt: continent Bladder mgmt: continent Diabetic mgmt:  Behavioral consideration  Chemo/radiation  visitor is  Son , Heath Lark   Previous Home Environment  Living Arrangements: Alone  Lives With: Alone Available Help at Discharge: Family, Available PRN/intermittently Type of Home: House Home Layout: One level Home Access: Stairs to enter Entrance Stairs-Rails: (one middle rail in center of steps) Entrance Stairs-Number of Steps: 7 with one handrail in midline Bathroom Shower/Tub: Chiropodist: Standard Bathroom Accessibility: Yes How Accessible: Accessible via walker Traer: No  Discharge Living Setting Plans for Discharge Living Setting: Patient's home, Alone Type of Home at Discharge: House Discharge Home Layout: One level Discharge Home Access: Stairs to enter Entrance Stairs-Rails: (rail in middle of steps) Entrance Stairs-Number of Steps: 7 Discharge Bathroom Shower/Tub: Tub/shower unit Discharge Bathroom Toilet: Standard Discharge Bathroom Accessibility: Yes How Accessible: Accessible via walker Does the patient have any problems obtaining your medications?: No  Social/Family/Support Systems Patient Roles: Parent Contact Information: son, [name] Anticipated Caregiver: children prn Anticipated Caregiver's Contact Information: see above Ability/Limitations of Caregiver: children work so can stop by in evenings after work Building control surveyor Availability: Evenings only Discharge Plan Discussed with Primary Caregiver: Yes Is Caregiver In Agreement with Plan?: Yes Does Caregiver/Family have Issues with Lodging/Transportation while Pt  is in Rehab?: No  Goals/Additional Needs Patient/Family Goal for Rehab: Mod I with PT and OT Expected length of stay: ELOS 2 weeks Special Service Needs: has been readmitted 3 times since initial dx COVID `11/2018 Pt/Family Agrees to Admission and willing to participate: Yes Program Orientation Provided & Reviewed with Pt/Caregiver Including Roles  & Responsibilities: Yes  Decrease burden of Care through IP rehab admission:   Possible need for  SNF placement upon discharge:   Patient Condition: I have reviewed medical records from [facility] , spoken with CM, and patient. I met with patient at the bedside for inpatient rehabilitation assessment.  Patient will benefit from ongoing PT and OT, can actively participate in 3 hours of therapy a day 5 days of the week, and can make measurable gains during the admission.  Patient will also benefit from the coordinated team approach during an Inpatient Acute Rehabilitation admission.  The patient will receive intensive therapy as well as Rehabilitation physician, nursing, social worker, and care management interventions.  Due to bladder management, bowel management, safety, skin/wound care, disease management, medication administration, pain management and patient education the patient requires 24 hour a day rehabilitation nursing.  The patient is currently min assist with mobility and basic ADLs.  Discharge setting and therapy post discharge at home with home health is anticipated.  Patient has agreed to participate in the Acute Inpatient Rehabilitation Program and will admit today.  Preadmission Screen Completed By:  Cleatrice Burke, 02/23/2019 12:12 PM ______________________________________________________________________   Discussed status with Dr. Dagoberto Ligas on 02/23/2019 at  1214 and received approval for admission today.  Admission Coordinator:  Cleatrice Burke, RN, time  7494 Date  02/23/2019   Assessment/Plan: Diagnosis: 1. Does  the need for close, 24 hr/day Medical supervision in concert with the patient's rehab needs make it unreasonable for this patient to be served in a less intensive setting? Yes 2. Co-Morbidities requiring supervision/potential complications: possible pulmonary fibrosis, cavitary lesions in lung, on Cefipime for pseudomonas pneumonia, thrush, CKD Stage III, HTN, chronic diastolic CHF 3. Due to bowel management, safety, skin/wound care, disease management, medication administration, pain management and patient education, does the patient require 24 hr/day rehab nursing? Yes 4. Does the patient require coordinated care of a physician, rehab nurse, PT, OT, and SLP to address physical and functional deficits in the context of the above medical diagnosis(es)? Yes Addressing deficits in the following areas: balance, endurance, locomotion, strength, transferring, bathing, dressing, feeding, grooming, toileting and psychosocial support 5. Can the patient actively participate in an intensive therapy program of at least 3 hrs of therapy 5 days a week? Yes 6. The potential for patient to make measurable gains while on inpatient rehab is good 7. Anticipated functional outcomes upon discharge from inpatient rehab: modified independent PT, modified independent OT, n/a SLP 8. Estimated rehab length of stay to reach the above functional goals is: 2 weeks 9. Anticipated discharge destination: Home 10. Overall Rehab/Functional Prognosis: good   MD Signature:

## 2019-02-22 NOTE — Progress Notes (Signed)
Physical Therapy Treatment Patient Details Name: Kurt [name] MRN: [id number] DOB: 10-26-1950 Today's Date: 02/22/2019    History of Present Illness Pt is 69 y.o. male with medical history significant of chronic diastolic congestive heart failure, hypertension, CKD stage III, recent COVID pneumonia (COVID in 11/2018) presenting to the ED for evaluation of shortness of breath. Pt admitted with resp failure and PNE.  CT Chest negative for PE.  TB results pending.    PT Comments    Pt with improved ambulation distance and tolerance today and was able to maintain SPO2 >92% on 6LO2 via [place]. Pt remains very motivated to return to indep function and desires to participate in aggressive rehab program. Suspect pt will progress well as pulmonary function improves. Acute PT to cont to follow.    Follow Up Recommendations  CIR     Equipment Recommendations  Other (comment)    Recommendations for Other Services Rehab consult;OT consult     Precautions / Restrictions Precautions Precautions: Fall Precaution Comments: monitor O2 Restrictions Weight Bearing Restrictions: No    Mobility  Bed Mobility               General bed mobility comments: pt sitting EOB upon PT arrival  Transfers Overall transfer level: Needs assistance Equipment used: None Transfers: Sit to/from Stand;Stand Pivot Transfers Sit to Stand: Modified independent (Device/Increase time) Stand pivot transfers: Supervision       General transfer comment: per pt MD just turned O2 down to 3lO2 from 6lO2, pt dropped into 80s with std pvt and demo'd increased SOB and lots of coughing, encouraged purse lipped breathing  Ambulation/Gait Ambulation/Gait assistance: Min guard Gait Distance (Feet): 60 Feet(x1 45x1) Assistive device: Rolling walker (2 wheeled) Gait Pattern/deviations: Step-through pattern;Decreased stride length;Wide base of support Gait velocity: dec Gait velocity interpretation: <1.31 ft/sec, indicative  of household ambulator General Gait Details: O2 increased to 6LO2 via [place], pt maintain SPO2 >92% on 6LO2 during amb. Pt required 1 seated rest break but demo'd increased ambulation tolerance   Stairs             Wheelchair Mobility    Modified Rankin (Stroke Patients Only)       Balance Overall balance assessment: Mild deficits observed, not formally tested Sitting-balance support: No upper extremity supported;Feet supported Sitting balance-Leahy Scale: Good Sitting balance - Comments: modI   Standing balance support: Bilateral upper extremity supported Standing balance-Leahy Scale: Fair Standing balance comment: dependent on UE support for ambulation, pt okay with static standing                            Cognition Arousal/Alertness: Awake/alert Behavior During Therapy: WFL for tasks assessed/performed Overall Cognitive Status: Within Functional Limits for tasks assessed                                        Exercises      General Comments General comments (skin integrity, edema, etc.): MD just turned O2 down from 6L to 3L, pt with SOB with just standing and dropped into 80s. Pt maintain SPO2 >92% on 6LO2 via  during amb      Pertinent Vitals/Pain Pain Assessment: No/denies pain(c/o "it feels like someone is sitting on my chest")    Home Living  Prior Function            PT Goals (current goals can now be found in the care plan section) Progress towards PT goals: Progressing toward goals    Frequency    Min 3X/week      PT Plan Current plan remains appropriate    Co-evaluation              AM-PAC PT "6 Clicks" Mobility   Outcome Measure  Help needed turning from your back to your side while in a flat bed without using bedrails?: None Help needed moving from lying on your back to sitting on the side of a flat bed without using bedrails?: None Help needed moving to and from a bed  to a chair (including a wheelchair)?: None Help needed standing up from a chair using your arms (e.g., wheelchair or bedside chair)?: None Help needed to walk in hospital room?: A Little Help needed climbing 3-5 steps with a railing? : A Little 6 Click Score: 22    End of Session Equipment Utilized During Treatment: Oxygen Activity Tolerance: Patient tolerated treatment well Patient left: in chair;with call bell/phone within reach Nurse Communication: Mobility status PT Visit Diagnosis: History of falling (Z91.81);Muscle weakness (generalized) (M62.81);Unsteadiness on feet (R26.81)     Time: 4536-4680 PT Time Calculation (min) (ACUTE ONLY): 20 min  Charges:  $Gait Training: 8-22 mins                     Kittie Plater, PT, DPT Acute Rehabilitation Services Pager #: [phone] Office #: [phone]    Berline Lopes 02/22/2019, 9:24 AM

## 2019-02-23 ENCOUNTER — Encounter (HOSPITAL_COMMUNITY): Payer: Self-pay | Admitting: Physical Medicine and Rehabilitation

## 2019-02-23 ENCOUNTER — Encounter (HOSPITAL_COMMUNITY): Payer: Self-pay | Admitting: Internal Medicine

## 2019-02-23 ENCOUNTER — Inpatient Hospital Stay (HOSPITAL_COMMUNITY)
Admission: RE | Admit: 2019-02-23 | Discharge: 2019-03-12 | DRG: 945 | Disposition: A | Discharge status: Home / Self Care | Payer: Medicare Other | Source: Intra-hospital | Attending: Physical Medicine and Rehabilitation | Admitting: Physical Medicine and Rehabilitation

## 2019-02-23 ENCOUNTER — Other Ambulatory Visit: Payer: Self-pay

## 2019-02-23 DIAGNOSIS — M549 Dorsalgia, unspecified: Secondary | ICD-10-CM | POA: Diagnosis present

## 2019-02-23 DIAGNOSIS — K269 Duodenal ulcer, unspecified as acute or chronic, without hemorrhage or perforation: Secondary | ICD-10-CM | POA: Diagnosis present

## 2019-02-23 DIAGNOSIS — F4310 Post-traumatic stress disorder, unspecified: Secondary | ICD-10-CM | POA: Diagnosis present

## 2019-02-23 DIAGNOSIS — G8929 Other chronic pain: Secondary | ICD-10-CM | POA: Diagnosis present

## 2019-02-23 DIAGNOSIS — K2950 Unspecified chronic gastritis without bleeding: Secondary | ICD-10-CM | POA: Diagnosis present

## 2019-02-23 DIAGNOSIS — I5030 Unspecified diastolic (congestive) heart failure: Secondary | ICD-10-CM | POA: Diagnosis present

## 2019-02-23 DIAGNOSIS — E66812 Obesity, class 2: Secondary | ICD-10-CM

## 2019-02-23 DIAGNOSIS — R0602 Shortness of breath: Secondary | ICD-10-CM

## 2019-02-23 DIAGNOSIS — Z8616 Personal history of COVID-19: Secondary | ICD-10-CM

## 2019-02-23 DIAGNOSIS — J151 Pneumonia due to Pseudomonas: Secondary | ICD-10-CM | POA: Diagnosis present

## 2019-02-23 DIAGNOSIS — M1990 Unspecified osteoarthritis, unspecified site: Secondary | ICD-10-CM | POA: Diagnosis present

## 2019-02-23 DIAGNOSIS — I5032 Chronic diastolic (congestive) heart failure: Secondary | ICD-10-CM | POA: Diagnosis present

## 2019-02-23 DIAGNOSIS — N1831 Chronic kidney disease, stage 3a: Secondary | ICD-10-CM | POA: Diagnosis present

## 2019-02-23 DIAGNOSIS — K1230 Oral mucositis (ulcerative), unspecified: Secondary | ICD-10-CM | POA: Diagnosis present

## 2019-02-23 DIAGNOSIS — I130 Hypertensive heart and chronic kidney disease with heart failure and stage 1 through stage 4 chronic kidney disease, or unspecified chronic kidney disease: Secondary | ICD-10-CM | POA: Diagnosis present

## 2019-02-23 DIAGNOSIS — Z6836 Body mass index (BMI) 36.0-36.9, adult: Secondary | ICD-10-CM | POA: Diagnosis not present

## 2019-02-23 DIAGNOSIS — J984 Other disorders of lung: Secondary | ICD-10-CM

## 2019-02-23 DIAGNOSIS — R0902 Hypoxemia: Secondary | ICD-10-CM | POA: Diagnosis present

## 2019-02-23 DIAGNOSIS — J849 Interstitial pulmonary disease, unspecified: Secondary | ICD-10-CM | POA: Diagnosis present

## 2019-02-23 DIAGNOSIS — R5381 Other malaise: Principal | ICD-10-CM | POA: Diagnosis present

## 2019-02-23 DIAGNOSIS — J189 Pneumonia, unspecified organism: Secondary | ICD-10-CM | POA: Diagnosis not present

## 2019-02-23 DIAGNOSIS — J9601 Acute respiratory failure with hypoxia: Secondary | ICD-10-CM | POA: Diagnosis not present

## 2019-02-23 DIAGNOSIS — E669 Obesity, unspecified: Secondary | ICD-10-CM | POA: Diagnosis present

## 2019-02-23 DIAGNOSIS — J9620 Acute and chronic respiratory failure, unspecified whether with hypoxia or hypercapnia: Secondary | ICD-10-CM | POA: Diagnosis present

## 2019-02-23 DIAGNOSIS — G4733 Obstructive sleep apnea (adult) (pediatric): Secondary | ICD-10-CM | POA: Diagnosis present

## 2019-02-23 DIAGNOSIS — R05 Cough: Secondary | ICD-10-CM

## 2019-02-23 DIAGNOSIS — J9621 Acute and chronic respiratory failure with hypoxia: Secondary | ICD-10-CM | POA: Diagnosis not present

## 2019-02-23 DIAGNOSIS — R0982 Postnasal drip: Secondary | ICD-10-CM | POA: Diagnosis not present

## 2019-02-23 DIAGNOSIS — R627 Adult failure to thrive: Secondary | ICD-10-CM

## 2019-02-23 DIAGNOSIS — F419 Anxiety disorder, unspecified: Secondary | ICD-10-CM | POA: Diagnosis present

## 2019-02-23 DIAGNOSIS — F329 Major depressive disorder, single episode, unspecified: Secondary | ICD-10-CM | POA: Diagnosis present

## 2019-02-23 DIAGNOSIS — J188 Other pneumonia, unspecified organism: Secondary | ICD-10-CM | POA: Diagnosis present

## 2019-02-23 DIAGNOSIS — U071 COVID-19: Secondary | ICD-10-CM

## 2019-02-23 DIAGNOSIS — N183 Chronic kidney disease, stage 3 unspecified: Secondary | ICD-10-CM | POA: Diagnosis present

## 2019-02-23 DIAGNOSIS — R059 Cough, unspecified: Secondary | ICD-10-CM

## 2019-02-23 LAB — BASIC METABOLIC PANEL
Anion gap: 10 (ref 5–15)
BUN: 26 mg/dL — ABNORMAL HIGH (ref 8–23)
CO2: 28 mmol/L (ref 22–32)
Calcium: 8.5 mg/dL — ABNORMAL LOW (ref 8.9–10.3)
Chloride: 96 mmol/L — ABNORMAL LOW (ref 98–111)
Creatinine, Ser: 1.07 mg/dL (ref 0.61–1.24)
GFR calc Af Amer: >60 mL/min (ref >60)
GFR calc non Af Amer: >60 mL/min (ref >60)
Glucose, Bld: 167 mg/dL — ABNORMAL HIGH (ref 70–99)
Potassium: 5 mmol/L (ref 3.5–5.1)
Sodium: 134 mmol/L — ABNORMAL LOW (ref 135–145)

## 2019-02-23 MED ORDER — SERTRALINE HCL 100 MG PO TABS
200.0000 mg | ORAL_TABLET | Freq: Every morning | ORAL | Status: DC
  Administered 2019-02-24 – 2019-03-12 (×17): 200 mg via ORAL
  Filled 2019-02-23 (×17): qty 2

## 2019-02-23 MED ORDER — ORAL CARE MOUTH RINSE
15.0000 mL | Freq: Two times a day (BID) | OROMUCOSAL | Status: DC
  Administered 2019-02-23 – 2019-03-10 (×27): 15 mL via OROMUCOSAL

## 2019-02-23 MED ORDER — PREDNISONE 20 MG PO TABS: 60.0000 mg | ORAL_TABLET | Freq: Every day | ORAL | 0 refills | Status: DC

## 2019-02-23 MED ORDER — PROCHLORPERAZINE MALEATE 5 MG PO TABS: 5.0000 mg | ORAL_TABLET | Freq: Four times a day (QID) | ORAL | Status: DC | PRN

## 2019-02-23 MED ORDER — FUROSEMIDE 40 MG PO TABS: 40.0000 mg | ORAL_TABLET | Freq: Every day | ORAL | 0 refills | Status: DC

## 2019-02-23 MED ORDER — SALINE SPRAY 0.65 % NA SOLN
1.0000 | NASAL | Status: DC | PRN
  Filled 2019-02-23: qty 44

## 2019-02-23 MED ORDER — POLYETHYLENE GLYCOL 3350 17 G PO PACK
17.0000 g | PACK | Freq: Every day | ORAL | Status: DC | PRN
  Administered 2019-02-28: 17 g via ORAL
  Filled 2019-02-23 (×2): qty 1

## 2019-02-23 MED ORDER — BISACODYL 10 MG RE SUPP: 10.0000 mg | Freq: Every day | RECTAL | Status: DC | PRN

## 2019-02-23 MED ORDER — HYDROCOD POLST-CPM POLST ER 10-8 MG/5ML PO SUER: 5.0000 mL | Freq: Two times a day (BID) | ORAL | Status: DC | PRN

## 2019-02-23 MED ORDER — NYSTATIN 100000 UNIT/ML MT SUSP
5.0000 mL | Freq: Four times a day (QID) | OROMUCOSAL | Status: AC
  Administered 2019-02-23 – 2019-03-04 (×34): 500000 [IU] via ORAL
  Filled 2019-02-23 (×33): qty 5

## 2019-02-23 MED ORDER — MENTHOL 3 MG MT LOZG
1.0000 | LOZENGE | Freq: Three times a day (TID) | OROMUCOSAL | Status: DC
  Administered 2019-02-23 – 2019-03-12 (×47): 3 mg via ORAL
  Filled 2019-02-23 (×5): qty 9

## 2019-02-23 MED ORDER — GUAIFENESIN-DM 100-10 MG/5ML PO SYRP: 5.0000 mL | ORAL_SOLUTION | Freq: Four times a day (QID) | ORAL | Status: DC | PRN

## 2019-02-23 MED ORDER — PANTOPRAZOLE SODIUM 40 MG PO TBEC: 40.0000 mg | DELAYED_RELEASE_TABLET | Freq: Every day | ORAL | Status: DC

## 2019-02-23 MED ORDER — IPRATROPIUM-ALBUTEROL 0.5-2.5 (3) MG/3ML IN SOLN: 3.0000 mL | Freq: Three times a day (TID) | RESPIRATORY_TRACT | 0 refills | Status: DC

## 2019-02-23 MED ORDER — PREDNISONE 20 MG PO TABS
60.0000 mg | ORAL_TABLET | Freq: Every day | ORAL | Status: DC
  Administered 2019-02-24 – 2019-03-12 (×17): 60 mg via ORAL
  Filled 2019-02-23 (×18): qty 3

## 2019-02-23 MED ORDER — MAGIC MOUTHWASH W/LIDOCAINE
5.0000 mL | Freq: Three times a day (TID) | ORAL | Status: DC
  Administered 2019-02-23 – 2019-03-12 (×50): 5 mL via ORAL
  Filled 2019-02-23 (×52): qty 5

## 2019-02-23 MED ORDER — ACETAMINOPHEN 325 MG PO TABS
325.0000 mg | ORAL_TABLET | ORAL | Status: DC | PRN
  Administered 2019-02-25 – 2019-03-02 (×7): 650 mg via ORAL
  Administered 2019-03-03: 325 mg via ORAL
  Filled 2019-02-23 (×8): qty 2

## 2019-02-23 MED ORDER — SODIUM CHLORIDE 0.9 % IV SOLN
2.0000 g | Freq: Three times a day (TID) | INTRAVENOUS | Status: AC
  Administered 2019-02-23 – 2019-03-01 (×19): 2 g via INTRAVENOUS
  Filled 2019-02-23 (×19): qty 2

## 2019-02-23 MED ORDER — PHENOL 1.4 % MT LIQD
1.0000 | OROMUCOSAL | Status: DC | PRN
  Filled 2019-02-23: qty 177

## 2019-02-23 MED ORDER — SODIUM CHLORIDE 0.9 % IV SOLN: 2.0000 g | Freq: Three times a day (TID) | INTRAVENOUS | 0 refills | Status: DC

## 2019-02-23 MED ORDER — FUROSEMIDE 40 MG PO TABS
40.0000 mg | ORAL_TABLET | Freq: Every day | ORAL | Status: DC
  Administered 2019-02-24 – 2019-03-12 (×17): 40 mg via ORAL
  Filled 2019-02-23 (×17): qty 1

## 2019-02-23 MED ORDER — LIDOCAINE HCL (PF) 4 % IJ SOLN
4.0000 mL | Freq: Two times a day (BID) | INTRAMUSCULAR | Status: DC
  Filled 2019-02-23 (×2): qty 5

## 2019-02-23 MED ORDER — ALUM & MAG HYDROXIDE-SIMETH 200-200-20 MG/5ML PO SUSP: 30.0000 mL | ORAL | Status: DC | PRN

## 2019-02-23 MED ORDER — LIDOCAINE HCL (PF) 4 % IJ SOLN
4.0000 mL | Freq: Two times a day (BID) | INTRAMUSCULAR | Status: DC
  Administered 2019-02-23 – 2019-02-28 (×2): 4 mL via RESPIRATORY_TRACT
  Filled 2019-02-23 (×20): qty 5

## 2019-02-23 MED ORDER — TRAZODONE HCL 50 MG PO TABS
25.0000 mg | ORAL_TABLET | Freq: Every evening | ORAL | Status: DC | PRN
  Administered 2019-02-25 – 2019-03-11 (×6): 50 mg via ORAL
  Filled 2019-02-23 (×6): qty 1

## 2019-02-23 MED ORDER — FLUCONAZOLE 100 MG PO TABS
200.0000 mg | ORAL_TABLET | Freq: Every day | ORAL | Status: AC
  Administered 2019-02-23: 200 mg via ORAL
  Filled 2019-02-23: qty 2

## 2019-02-23 MED ORDER — AMLODIPINE BESYLATE 2.5 MG PO TABS
2.5000 mg | ORAL_TABLET | Freq: Every day | ORAL | Status: DC
  Administered 2019-02-24 – 2019-03-12 (×17): 2.5 mg via ORAL
  Filled 2019-02-23 (×17): qty 1

## 2019-02-23 MED ORDER — ALLOPURINOL 100 MG PO TABS
100.0000 mg | ORAL_TABLET | Freq: Two times a day (BID) | ORAL | Status: DC
  Administered 2019-02-23 – 2019-03-12 (×34): 100 mg via ORAL
  Filled 2019-02-23 (×34): qty 1

## 2019-02-23 MED ORDER — FLUCONAZOLE 100 MG PO TABS
100.0000 mg | ORAL_TABLET | Freq: Every day | ORAL | Status: AC
  Administered 2019-02-24 – 2019-03-01 (×6): 100 mg via ORAL
  Filled 2019-02-23 (×6): qty 1

## 2019-02-23 MED ORDER — ALBUTEROL SULFATE (2.5 MG/3ML) 0.083% IN NEBU: 3.0000 mL | INHALATION_SOLUTION | RESPIRATORY_TRACT | Status: DC | PRN

## 2019-02-23 MED ORDER — IPRATROPIUM-ALBUTEROL 0.5-2.5 (3) MG/3ML IN SOLN
3.0000 mL | Freq: Three times a day (TID) | RESPIRATORY_TRACT | Status: DC
  Administered 2019-02-23 – 2019-02-26 (×10): 3 mL via RESPIRATORY_TRACT
  Filled 2019-02-23 (×9): qty 3

## 2019-02-23 MED ORDER — MAGIC MOUTHWASH W/LIDOCAINE: 5.0000 mL | Freq: Three times a day (TID) | ORAL | 0 refills | Status: DC

## 2019-02-23 MED ORDER — PREDNISONE 20 MG PO TABS: 40.0000 mg | ORAL_TABLET | Freq: Every day | ORAL | Status: DC

## 2019-02-23 MED ORDER — PROCHLORPERAZINE EDISYLATE 10 MG/2ML IJ SOLN: 5.0000 mg | Freq: Four times a day (QID) | INTRAMUSCULAR | Status: DC | PRN

## 2019-02-23 MED ORDER — SULFAMETHOXAZOLE-TRIMETHOPRIM 800-160 MG PO TABS: 1.0000 | ORAL_TABLET | ORAL | 0 refills | Status: DC

## 2019-02-23 MED ORDER — HYDROCODONE-ACETAMINOPHEN 5-325 MG PO TABS: 1.0000 | ORAL_TABLET | Freq: Four times a day (QID) | ORAL | Status: DC | PRN

## 2019-02-23 MED ORDER — FLEET ENEMA 7-19 GM/118ML RE ENEM: 1.0000 | ENEMA | Freq: Once | RECTAL | Status: DC | PRN

## 2019-02-23 MED ORDER — FERROUS SULFATE 325 (65 FE) MG PO TABS
325.0000 mg | ORAL_TABLET | Freq: Every day | ORAL | Status: DC
  Administered 2019-02-24 – 2019-03-12 (×17): 325 mg via ORAL
  Filled 2019-02-23 (×17): qty 1

## 2019-02-23 MED ORDER — SULFAMETHOXAZOLE-TRIMETHOPRIM 800-160 MG PO TABS
1.0000 | ORAL_TABLET | ORAL | Status: DC
  Administered 2019-02-24 – 2019-03-12 (×8): 1 via ORAL
  Filled 2019-02-23 (×8): qty 1

## 2019-02-23 MED ORDER — LIDOCAINE HCL (PF) 4 % IJ SOLN: 4.0000 mL | Freq: Two times a day (BID) | INTRAMUSCULAR | 0 refills | Status: DC

## 2019-02-23 MED ORDER — MONTELUKAST SODIUM 10 MG PO TABS
10.0000 mg | ORAL_TABLET | Freq: Every day | ORAL | Status: DC
  Administered 2019-02-23 – 2019-03-11 (×17): 10 mg via ORAL
  Filled 2019-02-23 (×17): qty 1

## 2019-02-23 MED ORDER — PREDNISONE 20 MG PO TABS: 20.0000 mg | ORAL_TABLET | Freq: Every day | ORAL | Status: DC

## 2019-02-23 MED ORDER — NYSTATIN 100000 UNIT/ML MT SUSP: 5.0000 mL | Freq: Four times a day (QID) | OROMUCOSAL | 0 refills | Status: DC

## 2019-02-23 MED ORDER — PANTOPRAZOLE SODIUM 40 MG PO TBEC
40.0000 mg | DELAYED_RELEASE_TABLET | Freq: Two times a day (BID) | ORAL | Status: DC
  Administered 2019-02-23 – 2019-03-12 (×34): 40 mg via ORAL
  Filled 2019-02-23 (×34): qty 1

## 2019-02-23 MED ORDER — MONTELUKAST SODIUM 10 MG PO TABS: 10.0000 mg | ORAL_TABLET | Freq: Every day | ORAL | Status: DC

## 2019-02-23 MED ORDER — MONTELUKAST SODIUM 10 MG PO TABS: 10.0000 mg | ORAL_TABLET | Freq: Every day | ORAL | 0 refills | Status: DC

## 2019-02-23 MED ORDER — ENOXAPARIN SODIUM 40 MG/0.4ML ~~LOC~~ SOLN
40.0000 mg | SUBCUTANEOUS | Status: DC
  Administered 2019-02-23 – 2019-03-11 (×17): 40 mg via SUBCUTANEOUS
  Filled 2019-02-23 (×17): qty 0.4

## 2019-02-23 MED ORDER — DIPHENHYDRAMINE HCL 12.5 MG/5ML PO ELIX: 12.5000 mg | ORAL_SOLUTION | Freq: Four times a day (QID) | ORAL | Status: DC | PRN

## 2019-02-23 MED ORDER — GUAIFENESIN-DM 100-10 MG/5ML PO SYRP
10.0000 mL | ORAL_SOLUTION | ORAL | Status: DC | PRN
  Administered 2019-03-08 – 2019-03-11 (×5): 10 mL via ORAL
  Filled 2019-02-23 (×5): qty 10

## 2019-02-23 MED ORDER — MENTHOL 3 MG MT LOZG
1.0000 | LOZENGE | Freq: Three times a day (TID) | OROMUCOSAL | Status: DC
  Administered 2019-02-23 (×2): 3 mg via ORAL

## 2019-02-23 MED ORDER — LIDOCAINE HCL (PF) 4 % IJ SOLN
4.0000 mL | Freq: Once | INTRAMUSCULAR | Status: AC
  Administered 2019-02-23: 09:00:00 4 mL via RESPIRATORY_TRACT
  Filled 2019-02-23: qty 5

## 2019-02-23 MED ORDER — PROCHLORPERAZINE 25 MG RE SUPP: 12.5000 mg | Freq: Four times a day (QID) | RECTAL | Status: DC | PRN

## 2019-02-23 NOTE — Progress Notes (Signed)
Patient has appt with Dr. Isaiah Serge on 03/17/19 at 9:30 AM.

## 2019-02-23 NOTE — H&P (Signed)
untitled image        Physical Medicine and Rehabilitation Admission H&P            Chief Complaint    Patient presents with    .   Debility          HPI: Kurt [name] is a 69 year old R handed male with history of chronic back pain (and back surgery x2), concussion -2003, PTSD, CKD stage IIIa, GIB, chronic diastolic CHF, OSA, Covid 19 dx 12/16/18 with admission 12/6- 12/16 treated with Remsesivir, dexamethasone and d/c to home on 2- 4L oxygen, readmissions on 12/28- 1/2 for worsening of DOE and decreased intake with diagnosis of cavitary lesions BUL and sent home on slow steriod wean as well as 3 weeks Augmentin for sinusitis. He was readmitted on 02/16/19 with fever, tachypnea and CTA chest was negative for PE but showed worsening of bilateral ground glass opacities c/w PNA, 18 mm partially cavitary RML nodule and enlarged right paratracheal lymph nodes. He was started on broad spectrum antibiotics and underwent bronchoscopy 1/28 and no lesions seen with friable easily irritated mucosa.  BAL with no growth and negative for AFB on smear. BC X 2 negative. Sputum culture with few pseudomonas aeruginosa.      ID consulted for input. Dr. Linus Salmons felt that patient's progressive hypoxia with SOB likely due to development of new interstitial disease and not bacterial in nature---may need lung biopsy for definite diagnosis  but to treat pseudomonas PNA with cefepime thorough Feb 9th. He has had reports of sore throat treated with nystatin mouthwash.       Dr. 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  recommends repeat CT chest around 2/26 for follow up on RML nodule, slow taper of steroids--60 mg X 4 weeks, 40 mg X 4 weeks then 20 mg X 4 weeks  Also recommends encouraging IS and Bactrim DS MWF for PJP ppx (weekly BMP X month). Patient refusing CPAP--per  pulmonary "CPAP at night; if patient wants a break from this I would D/C night-time oximetry, his O2 will go down with apneic events which would lead to inappropriate uptitration of night-time O2, patient informed of this"  Therapy ongoing and patient noted to be debilitated with SOB with minimal activity. Currently requires 3L at rest and 6 liters with activity.  CIR recommended due to functional decline.         C/O very sore throat- so sore, it's hard to swallow; even NOT wearing CPAP last night, and sore throat no different without CPAP.     Currently- on 3L O2 by [place].  Was riding his motorcycle 25-35k miles/year.   By the time he came into the hospital, was using rollator as wheelchair.      Was initially tested for COVID Wednesday before Thanksgiving given results on Friday after thanksgiving.   Is a retired EMT     Review of Systems   Constitutional: Negative for chills and fever.   HENT: Negative for hearing loss and tinnitus.    Eyes: Negative for blurred vision and double vision.   Respiratory: Positive for shortness of breath. Negative for cough.    Cardiovascular: Negative for chest pain and palpitations.   Gastrointestinal: Negative for diarrhea, heartburn and nausea.   Genitourinary: Negative for dysuria and urgency.   Musculoskeletal: Positive for back pain (takes tylenol # 3) and myalgias.  Skin: Negative for itching and rash.   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 Numbness/tingliing left hand) and weakness.   Psychiatric/Behavioral: The patient has insomnia.    All other systems reviewed and are negative.             Past Medical History:    Diagnosis   Date    .   Arthritis        .   CHF (congestive heart failure) (HCC)            diastolic dysfunction    .   Chronic back pain        .   COVID-19        .   Depression        .   Diverticulitis         .   Duodenal ulcer   05/2018        Treated with 3 clips/Dr. Henrene Pastor    .   GI bleed   05/2018    .   GIB (gastrointestinal bleeding)   2016        Antral ulcer/duodenal bleed per Endoscopy by Dr. Benson Norway    .   Hypertension        .   PTSD (post-traumatic stress disorder)            after  a Fall     .   Renal insufficiency            Decrease in GFR- PCP removed all NSAIDS & aspirin                 Past Surgical History:    Procedure   Laterality   Date    .   BIOPSY       06/16/2018        Procedure: BIOPSY;  Surgeon: Irene Shipper, MD;  Location: [facility] ENDOSCOPY;  Service: Endoscopy;;    .   ELBOW FRACTURE SURGERY                ORIF, Shoulder manipulation      .   ESOPHAGOGASTRODUODENOSCOPY   N/A   04/30/2014        Procedure: ESOPHAGOGASTRODUODENOSCOPY (EGD);  Surgeon: Carol Ada, MD;  Location: [facility] ENDOSCOPY;  Service: Endoscopy;  Laterality: N/A;    .   ESOPHAGOGASTRODUODENOSCOPY (EGD) WITH PROPOFOL   N/A   06/16/2018        Procedure: ESOPHAGOGASTRODUODENOSCOPY (EGD) WITH PROPOFOL;  Surgeon: Irene Shipper, MD;  Location: [facility] ENDOSCOPY;  Service: Endoscopy;  Laterality: N/A;    .   HEMOSTASIS CLIP PLACEMENT       06/16/2018        Procedure: HEMOSTASIS CLIP PLACEMENT;  Surgeon: Irene Shipper, MD;  Location: [facility] ENDOSCOPY;  Service: Endoscopy;;    .   LUMBAR LAMINECTOMY/DECOMPRESSION MICRODISCECTOMY   N/A   02/16/2015        Procedure: Thoracic ten - thoracic twelve laminectomy;  Surgeon: Earnie Larsson, MD;  Location: [facility] NEURO ORS;  Service: Neurosurgery;  Laterality: N/A;    .   LUMBAR LAMINECTOMY/DECOMPRESSION MICRODISCECTOMY   N/A   03/22/2016        Procedure: Lumbar One-Two, Lumbar Two-Three, Lumbar Three-Four Laminectomy and Foraminotomy;  Surgeon: Earnie Larsson, MD;  Location: [place];  Service: Neurosurgery;  Laterality: N/A;    .   NASAL SEPTUM  SURGERY            .  SCLEROTHERAPY       06/16/2018        Procedure: SCLEROTHERAPY;  Surgeon: Irene Shipper, MD;  Location: [facility] ENDOSCOPY;  Service: Endoscopy;;    .   VIDEO BRONCHOSCOPY   Bilateral   02/18/2019        Procedure: VIDEO BRONCHOSCOPY WITHOUT FLUORO;  Surgeon: Margaretha Seeds, MD;  Location: [place];  Service: Pulmonary;  Laterality: Bilateral;          History reviewed. No pertinent family history.         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 Has family in the area. Rides on motorcycles. Walks in the pool at aquatic center. Widowed. Active prior to Covid. He reports that he has never smoked. He has never used smokeless tobacco. He reports previous alcohol use. He reports that he does not use drugs.         Allergies: No Known Allergies                Medications Prior to Admission    Medication   Sig   Dispense   Refill    .   acetaminophen (TYLENOL) 500 MG tablet   Take 1,000 mg by mouth every 4 (four) hours as needed for headache (pain).            .   Acetaminophen-Codeine 300-30 MG tablet   Take 1-2 tablets by mouth every 4 (four) hours as needed for pain (max 8 tabs/24 hours).             Marland Kitchen   albuterol (VENTOLIN HFA) 108 (90 Base) MCG/ACT inhaler   Inhale 2 puffs into the lungs every 4 (four) hours as needed for wheezing or shortness of breath.             .   allopurinol (ZYLOPRIM) 100 MG tablet   Take 100 mg by mouth 2 (two) times a day.            Marland Kitchen   amLODipine (NORVASC) 2.5 MG tablet   Take 2.5 mg by mouth daily.            .   benzonatate (TESSALON PERLES) 100 MG capsule   Take 1 capsule (100 mg total) by mouth 3 (three) times daily as needed for cough.   30 capsule   0    .   chlorpheniramine-HYDROcodone (TUSSIONEX) 10-8 MG/5ML SUER   Take 5 mLs by mouth every 12 (twelve) hours as needed for cough.   70  mL   0    .   ferrous sulfate 324 MG TBEC   Take 324 mg by mouth daily with breakfast.            .   fluticasone (FLONASE) 50 MCG/ACT nasal spray   Place 2 sprays into both nostrils daily.   11.1 mL   0    .   lisinopril (ZESTRIL) 20 MG tablet   Take 1 tablet (20 mg total) by mouth daily.   30 tablet   0    .   oxymetazoline (AFRIN) 0.05 % nasal spray   Place 1 spray into both nostrils 2 (two) times daily.   30 mL   0    .   pantoprazole (PROTONIX) 40 MG tablet   Take 1 tablet (40 mg total) by mouth 2 (two) times daily for 30 days.   60 tablet   0    .  polyethylene glycol (MIRALAX / GLYCOLAX) 17 g packet   Take 17 g by mouth daily as needed for mild constipation.   14 each   0    .   sertraline (ZOLOFT) 100 MG tablet   Take 1 tablet (100 mg total) by mouth daily. (Patient taking differently: Take 200 mg by mouth every morning. )   90 tablet   3    .   amoxicillin-clavulanate (AUGMENTIN) 875-125 MG tablet   Take 1 tablet by mouth every 12 (twelve) hours. (Patient not taking: Reported on 02/16/2019)   56 tablet   0    .   predniSONE (DELTASONE) 10 MG tablet   Take 2.5 tablets (25 mg total) by mouth daily with breakfast. After discharge.  Day 1-7 (Patient not taking: Reported on 02/16/2019)   16 tablet   0    .   predniSONE (DELTASONE) 10 MG tablet   Take 1 tablet (10 mg total) by mouth daily with breakfast. After discharge day 8 through 14 (Patient not taking: Reported on 02/16/2019)   7 tablet   0    .   predniSONE (DELTASONE) 10 MG tablet   Take 0.5 tablets (5 mg total) by mouth daily with breakfast. After discharge day 15 until completion (Patient not taking: Reported on 02/16/2019)   3 tablet   0          Drug Regimen Review   Drug regimen was reviewed and remains appropriate with no significant issues identified     Home:  Home Living  Family/patient expects to be discharged  to:: Private residence  Living Arrangements: Alone  Available Help at Discharge: Family, Available PRN/intermittently  Type of Home: House  Home Access: Stairs to enter  [employer] of Steps: 7 with one handrail in midline  Entrance Stairs-Rails: (one middle rail in center of steps)  Home Layout: One level  Bathroom Shower/Tub: Administrator, Civil Service: Standard  Bathroom Accessibility: Yes  Home Equipment: Civil engineer, contracting, Environmental consultant - 4 wheels, Walker - 2 wheels, [place] - single point   Lives With: Alone     Functional History:  Prior Function  Level of Independence: Independent  Gait / Transfers Assistance Needed: Reports recent fall by tripping on O2 tubing; Has had a few good days since [place] that he was able to get out but gradually getting weaker and recently only household ambulation with RW - limited by O2 sats  ADL's / Homemaking Assistance Needed: Pt reports he has been performing ADLs mod I, but struggles and fatigues quickly.  He reports his 02 saturations decrease with minimal activity.  He has groceries delivered.   He endorses 1 fall in the past several weeks   Comments: Prior to having COVID in November he was very independent.  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     Functional Status:   Mobility:  Bed Mobility  Overal bed mobility: (pt received and left in recliner)  Bed Mobility: Supine to Sit  Supine to sit: Supervision  General bed mobility comments: pt up in recliner upon my arrival  Transfers  Overall transfer level: Needs assistance  Equipment used: None  Transfers: Sit to/from Stand, Stand Pivot Transfers  Sit to Stand: Supervision  Stand pivot transfers: Supervision(management of lines)  Squat pivot transfers: Min guard  General transfer comment: Pt is able to do stand pivots at a S level however with just this activity he gets SOB even  on 6 liters and sats dropped as low as 84% with education on purse lipped breathing to gets sats back up into mid 90's on 6 liters.  Ambulation/Gait  Ambulation/Gait assistance: Min guard  Gait Distance (Feet): 60 Feet(x1 45x1)  Assistive device: Rolling walker (2 wheeled)  Gait Pattern/deviations: Step-through pattern, Decreased stride length, Wide base of support  General Gait Details: O2 increased to 6LO2 via [place], pt maintain SPO2 >92% on 6LO2 during amb. Pt required 1 seated rest break but demo'd increased ambulation tolerance  Gait velocity: dec  Gait velocity interpretation: <1.31 ft/sec, indicative of household ambulator       ADL:  ADL  Overall ADL's : Needs assistance/impaired  Eating/Feeding: Independent  Grooming: Wash/dry hands, Wash/dry face, Oral care, Supervision/safety, Standing  Upper Body Bathing: Set up, Sitting  Lower Body Bathing: Sit to/from stand, Minimal assistance  Upper Body Dressing : Set up, Sitting  Lower Body Dressing: Minimal assistance, Sitting/lateral leans  Lower Body Dressing Details (indicate cue type and reason): education on LB dressing compensatory method of using figure four method to don pants as ECS  Toilet Transfer: Stand-pivot, Teacher, music Details (indicate cue type and reason): bed<>recliner, no AD  Toileting- Clothing Manipulation and Hygiene: Set up, Sitting/lateral lean  Functional mobility during ADLs: Min guard, Minimal assistance, Rolling walker  General ADL Comments: With use of AE (reacher and sock aid) pt with less SOB than without AE     Cognition:  Cognition  Overall Cognitive Status: Within Functional Limits for tasks assessed  Orientation Level: Oriented X4  Cognition  Arousal/Alertness: Awake/alert  Behavior During Therapy: WFL for tasks assessed/performed  Overall Cognitive Status: Within Functional Limits for tasks assessed  General Comments: very motivated to  return to PLOF (active--going to water therapy 4 days a week prior to [place] in Nov 2020)     Physical Exam:  Blood pressure 127/64, pulse 95, temperature 99.3 F (37.4 C), temperature source Oral, resp. rate 20, height 5' 10.5" (1.791 m), weight 120.1 kg, SpO2 93 %.  Physical Exam   Nursing note and vitals reviewed.  Constitutional: He is oriented to person, place, and time. He appears well-developed and well-nourished.  Feels heaviness while talking leading to coughing.  Sitting up in chair at bedside, watching TV, NAD O2 sats on 3L [place] dropped to 83% while talking without taking a break and HR increased to 105- machine was beeping.    HENT:   Head: Normocephalic and atraumatic.   Mouth/Throat: Oropharyngeal exudate present.  Back of tongue coated- appears to be thrush, esp with tonsillar area erythematous; localized erythema. Like half treated thrush    Eyes: Pupils are equal, round, and reactive to light. EOM are normal. Right eye exhibits no discharge. Left eye exhibits no discharge.   Neck: No tracheal deviation present.   Cardiovascular:  borderline tachycardia- hanging out 95-99 , but overall RRR   Respiratory: No stridor. No respiratory distress. He has no wheezes.  Decreased breath sounds in bases B/L- decreased air movement in bases- good air movement in apex B/L; no w/r/r, however when coughed, sounded like very congested, and wheezy   GI: He exhibits no distension. There is abdominal tenderness.  Soft, mildly TTP- diffuse-protuberant; hypoactive BS; LBM today    Musculoskeletal:         General: No edema.     Cervical back: Normal range of motion and neck supple.     Comments: UEs- deltoid 3-/5 B/L; 5-/5 in arms biceps, triceps, WE, grip,  finger abd B/L  LEs- HF, KE, KF, DF, PF all 5-/5 B/L  Neurological: He is alert and oriented to person, place, and time. He displays normal reflexes. No cranial nerve deficit.  Speech clear. On 3L O2 by [place] Decreased  sensation to light touch in LEs; and LUE to elbow has decreased sensation to light touch.   Intact sensation in RUE.      Skin: Skin is warm and dry.  Tongue beefy Bruise on knee  Psychiatric:  Calm initially but increased symptoms with conversation--component of anxiety.  Got upset when discussing initial hospital care.          Lab Results Last 48 Hours           Results for orders placed or performed during the hospital encounter of 02/15/19 (from the past 48 hour(s))    Basic metabolic panel     Status: Abnormal        Collection Time: 02/22/19  4:16 AM    Result   Value   Ref Range        Sodium   138   135 - 145 mmol/L        Potassium   4.7   3.5 - 5.1 mmol/L        Chloride   98   98 - 111 mmol/L        CO2   29   22 - 32 mmol/L        Glucose, Bld   121 (H)   70 - 99 mg/dL        BUN   22   8 - 23 mg/dL        Creatinine, Ser   1.05   0.61 - 1.24 mg/dL        Calcium   8.9   8.9 - 10.3 mg/dL        GFR calc non Af Amer   >60   >60 mL/min        GFR calc Af Amer   >60   >60 mL/min        Anion gap   11   5 - 15            Comment:   Performed at [facility] [address] [place], [place] [address]    Basic metabolic panel     Status: Abnormal        Collection Time: 02/23/19  3:02 AM    Result   Value   Ref Range        Sodium   134 (L)   135 - 145 mmol/L        Potassium   5.0   3.5 - 5.1 mmol/L        Chloride   96 (L)   98 - 111 mmol/L        CO2   28   22 - 32 mmol/L        Glucose, Bld   167 (H)   70 - 99 mg/dL        BUN   26 (H)   8 - 23 mg/dL        Creatinine, Ser   1.07   0.61 - 1.24 mg/dL        Calcium   8.5 (L)   8.9 - 10.3 mg/dL        GFR calc non Af Amer   >60   >60 mL/min  GFR calc Af Amer   >60   >60 mL/min        Anion gap    10   5 - 15            Comment:   Performed at [facility] [address] [place], [place] [address]          Imaging Results (Last 48 hours)                   Medical Problem List and Plan:  1.  Impaired mobility, function, and ADLs secondary to debility from  Pseudomonas pneumonia s/p 3rd recent hospitalization for pneumonia- initial hospital stay for COVID pneumonia.               -patient may shower              -ELOS/Goals: 2- 2.5 weeks; goals mod I  2.  Antithrombotics:  -DVT/anticoagulation:  Pharmaceutical: Lovenox              -antiplatelet therapy: N/a  3. Chronic back pain/Pain Management: Hydrocodone prn   4. H/o depression/Mood: team to provide ego support and help with anxiety.               -antipsychotic agents: N/A  5. Neuropsych: This patient is capable of making decisions on his own behalf.  6. Skin/Wound Care: routine pressure relief measures.   7. Fluids/Electrolytes/Nutrition: Monitor I/O. Check lytes in am.   8. Pseudomonas PNA: On Cefepime thorough 02/09  9. Post Covid ILD: On prednisone 60 mg/day thorough 3/3 then decrease by 20 mg/month till off. Bactrim DS MWF for PJP prophylaxis. Duonebs tid  10. OSA: Encourage CPAP use at nights.   11. Pharyngeal mucositis: MMW Qid (was TID) and Nystatin mouth wash qid and trial of nebulized lidocaine added on  2/1. Will also add Diflucan 200 mg x 1 and 100 daily x 6 days.    12. CKD Stage IIIa: Monitor BMET with serial checks.   50. H/o recurrent GIB/chronic gastritis 05/2018: PPI recommended indefinitely-- will resume with recent use of NSAIDs as well as steroids on board.   14. Chronic diastolic CHF- will monitor daily weights and treat as required.         Bary Leriche, PA-C  02/23/2019      I have personally performed a face to face diagnostic evaluation of this patient and formulated the key components of the plan.  Additionally, I have personally reviewed  laboratory data, imaging studies, as well as relevant notes and concur with the physician assistant's documentation above.     The patient's status has not changed from the original H&P.  Any changes in documentation from the acute care chart have been noted above.                     Revision History                                     Routing History

## 2019-02-23 NOTE — H&P (Signed)
Physical Medicine and Rehabilitation Admission H&P    Chief Complaint  Patient presents with  . Debility    HPI: Kurt [name] is a 69 year old R handed male with history of chronic back pain (and back surgery x2), concussion -2003, PTSD, CKD stage IIIa, GIB, chronic diastolic CHF, OSA, Covid 19 dx 12/16/18 with admission 12/6- 12/16 treated with Remsesivir, dexamethasone and d/c to home on 2- 4L oxygen, readmissions on 12/28- 1/2 for worsening of DOE and decreased intake with diagnosis of cavitary lesions BUL and sent home on slow steriod wean as well as 3 weeks Augmentin for sinusitis. He was readmitted on 02/16/19 with fever, tachypnea and CTA chest was negative for PE but showed worsening of bilateral ground glass opacities c/w PNA, 18 mm partially cavitary RML nodule and enlarged right paratracheal lymph nodes. He was started on broad spectrum antibiotics and underwent bronchoscopy 1/28 and no lesions seen with friable easily irritated mucosa.  BAL with no growth and negative for AFB on smear. BC X 2 negative. Sputum culture with few pseudomonas aeruginosa.   ID consulted for input. Dr. Linus Salmons felt that patient's progressive hypoxia with SOB likely due to development of new interstitial disease and not bacterial in nature---may need lung biopsy for definite diagnosis  but to treat pseudomonas PNA with cefepime thorough Feb 9th. He has had reports of sore throat treated with nystatin mouthwash.    Dr. 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  recommends repeat CT chest around 2/26 for follow up on RML nodule, slow taper of steroids--60 mg X 4 weeks, 40 mg X 4 weeks then 20 mg X 4 weeks  Also recommends encouraging IS and Bactrim DS MWF for PJP ppx (weekly BMP X month). Patient refusing CPAP--per pulmonary "CPAP at night; if patient wants a break from this I would D/C  night-time oximetry, his O2 will go down with apneic events which would lead to inappropriate uptitration of night-time O2, patient informed of this"  Therapy ongoing and patient noted to be debilitated with SOB with minimal activity. Currently requires 3L at rest and 6 liters with activity.  CIR recommended due to functional decline.    C/O very sore throat- so sore, it's hard to swallow; even NOT wearing CPAP last night, and sore throat no different without CPAP.  Currently- on 3L O2 by [place]. Was riding his motorcycle 25-35k miles/year.  By the time he came into the hospital, was using rollator as wheelchair.   Was initially tested for COVID Wednesday before Thanksgiving given results on Friday after thanksgiving.  Is a retired EMT  Review of Systems  Constitutional: Negative for chills and fever.  HENT: Negative for hearing loss and tinnitus.   Eyes: Negative for blurred vision and double vision.  Respiratory: Positive for shortness of breath. Negative for cough.   Cardiovascular: Negative for chest pain and palpitations.  Gastrointestinal: Negative for diarrhea, heartburn and nausea.  Genitourinary: Negative for dysuria and urgency.  Musculoskeletal: Positive for back pain (takes tylenol # 3) and myalgias.  Skin: Negative for itching and rash.  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 Numbness/tingliing left hand) and weakness.  Psychiatric/Behavioral: The patient has insomnia.   All other systems reviewed and are negative.    Past Medical History:  Diagnosis Date  . Arthritis   . CHF (congestive heart failure) ([place])  diastolic dysfunction  . Chronic back pain   . COVID-19   . Depression   . Diverticulitis   . Duodenal ulcer 05/2018   Treated with 3 clips/Dr. Henrene Pastor  . GI bleed 05/2018  . GIB (gastrointestinal bleeding) 2016   Antral ulcer/duodenal bleed per Endoscopy by Dr. Benson Norway  . Hypertension   . PTSD (post-traumatic stress  disorder)    after  a Fall   . Renal insufficiency    Decrease in GFR- PCP removed all NSAIDS & aspirin     Past Surgical History:  Procedure Laterality Date  . BIOPSY  06/16/2018   Procedure: BIOPSY;  Surgeon: Irene Shipper, MD;  Location: [facility] ENDOSCOPY;  Service: Endoscopy;;  . ELBOW FRACTURE SURGERY     ORIF, Shoulder manipulation    . ESOPHAGOGASTRODUODENOSCOPY N/A 04/30/2014   Procedure: ESOPHAGOGASTRODUODENOSCOPY (EGD);  Surgeon: Carol Ada, MD;  Location: [facility] ENDOSCOPY;  Service: Endoscopy;  Laterality: N/A;  . ESOPHAGOGASTRODUODENOSCOPY (EGD) WITH PROPOFOL N/A 06/16/2018   Procedure: ESOPHAGOGASTRODUODENOSCOPY (EGD) WITH PROPOFOL;  Surgeon: Irene Shipper, MD;  Location: [facility] ENDOSCOPY;  Service: Endoscopy;  Laterality: N/A;  . HEMOSTASIS CLIP PLACEMENT  06/16/2018   Procedure: HEMOSTASIS CLIP PLACEMENT;  Surgeon: Irene Shipper, MD;  Location: [facility] ENDOSCOPY;  Service: Endoscopy;;  . LUMBAR LAMINECTOMY/DECOMPRESSION MICRODISCECTOMY N/A 02/16/2015   Procedure: Thoracic ten - thoracic twelve laminectomy;  Surgeon: Earnie Larsson, MD;  Location: [place] NEURO ORS;  Service: Neurosurgery;  Laterality: N/A;  . LUMBAR LAMINECTOMY/DECOMPRESSION MICRODISCECTOMY N/A 03/22/2016   Procedure: Lumbar One-Two, Lumbar Two-Three, Lumbar Three-Four Laminectomy and Foraminotomy;  Surgeon: Earnie Larsson, MD;  Location: [place];  Service: Neurosurgery;  Laterality: N/A;  . NASAL SEPTUM SURGERY    . SCLEROTHERAPY  06/16/2018   Procedure: SCLEROTHERAPY;  Surgeon: Irene Shipper, MD;  Location: [facility] ENDOSCOPY;  Service: Endoscopy;;  . VIDEO BRONCHOSCOPY Bilateral 02/18/2019   Procedure: VIDEO BRONCHOSCOPY WITHOUT FLUORO;  Surgeon: Margaretha Seeds, MD;  Location: [place];  Service: Pulmonary;  Laterality: Bilateral;    History reviewed. No pertinent family history.    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 Has family in the area. Rides on motorcycles. Walks in the pool at aquatic center.  Widowed. Active prior to Covid. He reports that he has never smoked. He has never used smokeless tobacco. He reports previous alcohol use. He reports that he does not use drugs.    Allergies: No Known Allergies    Medications Prior to Admission  Medication Sig Dispense Refill  . acetaminophen (TYLENOL) 500 MG tablet Take 1,000 mg by mouth every 4 (four) hours as needed for headache (pain).    . Acetaminophen-Codeine 300-30 MG tablet Take 1-2 tablets by mouth every 4 (four) hours as needed for pain (max 8 tabs/24 hours).     Marland Kitchen albuterol (VENTOLIN HFA) 108 (90 Base) MCG/ACT inhaler Inhale 2 puffs into the lungs every 4 (four) hours as needed for wheezing or shortness of breath.     . allopurinol (ZYLOPRIM) 100 MG tablet Take 100 mg by mouth 2 (two) times a day.    Marland Kitchen amLODipine (NORVASC) 2.5 MG tablet Take 2.5 mg by mouth daily.    . benzonatate (TESSALON PERLES) 100 MG capsule Take 1 capsule (100 mg total) by mouth 3 (three) times daily as needed for cough. 30 capsule 0  . chlorpheniramine-HYDROcodone (TUSSIONEX) 10-8 MG/5ML SUER Take 5 mLs by mouth every 12 (twelve) hours as needed for cough. 70 mL 0  . ferrous sulfate 324  MG TBEC Take 324 mg by mouth daily with breakfast.    . fluticasone (FLONASE) 50 MCG/ACT nasal spray Place 2 sprays into both nostrils daily. 11.1 mL 0  . lisinopril (ZESTRIL) 20 MG tablet Take 1 tablet (20 mg total) by mouth daily. 30 tablet 0  . oxymetazoline (AFRIN) 0.05 % nasal spray Place 1 spray into both nostrils 2 (two) times daily. 30 mL 0  . pantoprazole (PROTONIX) 40 MG tablet Take 1 tablet (40 mg total) by mouth 2 (two) times daily for 30 days. 60 tablet 0  . polyethylene glycol (MIRALAX / GLYCOLAX) 17 g packet Take 17 g by mouth daily as needed for mild constipation. 14 each 0  . sertraline (ZOLOFT) 100 MG tablet Take 1 tablet (100 mg total) by mouth daily. (Patient taking differently: Take 200 mg by mouth every morning. ) 90 tablet 3  . amoxicillin-clavulanate  (AUGMENTIN) 875-125 MG tablet Take 1 tablet by mouth every 12 (twelve) hours. (Patient not taking: Reported on 02/16/2019) 56 tablet 0  . predniSONE (DELTASONE) 10 MG tablet Take 2.5 tablets (25 mg total) by mouth daily with breakfast. After discharge.  Day 1-7 (Patient not taking: Reported on 02/16/2019) 16 tablet 0  . predniSONE (DELTASONE) 10 MG tablet Take 1 tablet (10 mg total) by mouth daily with breakfast. After discharge day 8 through 14 (Patient not taking: Reported on 02/16/2019) 7 tablet 0  . predniSONE (DELTASONE) 10 MG tablet Take 0.5 tablets (5 mg total) by mouth daily with breakfast. After discharge day 15 until completion (Patient not taking: Reported on 02/16/2019) 3 tablet 0    Drug Regimen Review  Drug regimen was reviewed and remains appropriate with no significant issues identified  Home: Home Living Family/patient expects to be discharged to:: Private residence Living Arrangements: Alone Available Help at Discharge: Family, Available PRN/intermittently Type of Home: House Home Access: Stairs to enter [employer] of Steps: 7 with one handrail in midline Entrance Stairs-Rails: (one middle rail in center of steps) Home Layout: One level Bathroom Shower/Tub: Chiropodist: Standard Bathroom Accessibility: Yes Home Equipment: Civil engineer, contracting, Environmental consultant - 4 wheels, Walker - 2 wheels, [place] - single point  Lives With: Alone   Functional History: Prior Function Level of Independence: Independent Gait / Transfers Assistance Needed: Reports recent fall by tripping on O2 tubing; Has had a few good days since [place] that he was able to get out but gradually getting weaker and recently only household ambulation with RW - limited by O2 sats ADL's / Homemaking Assistance Needed: Pt reports he has been performing ADLs mod I, but struggles and fatigues quickly.  He reports his 02 saturations decrease with minimal activity.  He has groceries delivered.   He  endorses 1 fall in the past several weeks  Comments: Prior to having COVID in November he was very independent.  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  Functional Status:  Mobility: Bed Mobility Overal bed mobility: (pt received and left in recliner) Bed Mobility: Supine to Sit Supine to sit: Supervision General bed mobility comments: pt up in recliner upon my arrival Transfers Overall transfer level: Needs assistance Equipment used: None Transfers: Sit to/from Stand, Stand Pivot Transfers Sit to Stand: Supervision Stand pivot transfers: Supervision(management of lines) Squat pivot transfers: Min guard General transfer comment: Pt is able to do stand pivots at a S level however with just this activity he gets SOB even on 6 liters and sats dropped as  low as 84% with education on purse lipped breathing to gets sats back up into mid 90's on 6 liters. Ambulation/Gait Ambulation/Gait assistance: Min guard Gait Distance (Feet): 60 Feet(x1 45x1) Assistive device: Rolling walker (2 wheeled) Gait Pattern/deviations: Step-through pattern, Decreased stride length, Wide base of support General Gait Details: O2 increased to 6LO2 via [place], pt maintain SPO2 >92% on 6LO2 during amb. Pt required 1 seated rest break but demo'd increased ambulation tolerance Gait velocity: dec Gait velocity interpretation: <1.31 ft/sec, indicative of household ambulator    ADL: ADL Overall ADL's : Needs assistance/impaired Eating/Feeding: Independent Grooming: Wash/dry hands, Wash/dry face, Oral care, Supervision/safety, Standing Upper Body Bathing: Set up, Sitting Lower Body Bathing: Sit to/from stand, Minimal assistance Upper Body Dressing : Set up, Sitting Lower Body Dressing: Minimal assistance, Sitting/lateral leans Lower Body Dressing Details (indicate cue type and reason): education on LB dressing compensatory method of using figure four  method to don pants as ECS Toilet Transfer: Stand-pivot, Copy Details (indicate cue type and reason): bed<>recliner, no AD Toileting- Clothing Manipulation and Hygiene: Set up, Sitting/lateral lean Functional mobility during ADLs: Min guard, Minimal assistance, Rolling walker General ADL Comments: With use of AE (reacher and sock aid) pt with less SOB than without AE  Cognition: Cognition Overall Cognitive Status: Within Functional Limits for tasks assessed Orientation Level: Oriented X4 Cognition Arousal/Alertness: Awake/alert Behavior During Therapy: WFL for tasks assessed/performed Overall Cognitive Status: Within Functional Limits for tasks assessed General Comments: very motivated to return to PLOF (active--going to water therapy 4 days a week prior to [place] in Nov 2020)  Physical Exam: Blood pressure 127/64, pulse 95, temperature 99.3 F (37.4 C), temperature source Oral, resp. rate 20, height 5' 10.5" (1.791 m), weight 120.1 kg, SpO2 93 %. Physical Exam  Nursing note and vitals reviewed. Constitutional: He is oriented to person, place, and time. He appears well-developed and well-nourished.  Feels heaviness while talking leading to coughing.  Sitting up in chair at bedside, watching TV, NAD O2 sats on 3L [place] dropped to 83% while talking without taking a break and HR increased to 105- machine was beeping.   HENT:  Head: Normocephalic and atraumatic.  Mouth/Throat: Oropharyngeal exudate present.  Back of tongue coated- appears to be thrush, esp with tonsillar area erythematous; localized erythema. Like half treated thrush   Eyes: Pupils are equal, round, and reactive to light. EOM are normal. Right eye exhibits no discharge. Left eye exhibits no discharge.  Neck: No tracheal deviation present.  Cardiovascular:  borderline tachycardia- hanging out 95-99 , but overall RRR  Respiratory: No stridor. No respiratory distress. He has no wheezes.   Decreased breath sounds in bases B/L- decreased air movement in bases- good air movement in apex B/L; no w/r/r, however when coughed, sounded like very congested, and wheezy  GI: He exhibits no distension. There is abdominal tenderness.  Soft, mildly TTP- diffuse-protuberant; hypoactive BS; LBM today   Musculoskeletal:        General: No edema.     Cervical back: Normal range of motion and neck supple.     Comments: UEs- deltoid 3-/5 B/L; 5-/5 in arms biceps, triceps, WE, grip, finger abd B/L  LEs- HF, KE, KF, DF, PF all 5-/5 B/L  Neurological: He is alert and oriented to person, place, and time. He displays normal reflexes. No cranial nerve deficit.  Speech clear. On 3L O2 by [place] Decreased sensation to light touch in LEs; and LUE to elbow has decreased sensation to light touch.  Intact sensation in RUE.     Skin: Skin is warm and dry.  Tongue beefy Bruise on knee  Psychiatric:  Calm initially but increased symptoms with conversation--component of anxiety.  Got upset when discussing initial hospital care.     Results for orders placed or performed during the hospital encounter of 02/15/19 (from the past 48 hour(s))  Basic metabolic panel     Status: Abnormal   Collection Time: 02/22/19  4:16 AM  Result Value Ref Range   Sodium 138 135 - 145 mmol/L   Potassium 4.7 3.5 - 5.1 mmol/L   Chloride 98 98 - 111 mmol/L   CO2 29 22 - 32 mmol/L   Glucose, Bld 121 (H) 70 - 99 mg/dL   BUN 22 8 - 23 mg/dL   Creatinine, Ser 1.05 0.61 - 1.24 mg/dL   Calcium 8.9 8.9 - 10.3 mg/dL   GFR calc non Af Amer >60 >60 mL/min   GFR calc Af Amer >60 >60 mL/min   Anion gap 11 5 - 15    Comment: Performed at [facility] [address] [place], [place] [address]  Basic metabolic panel     Status: Abnormal   Collection Time: 02/23/19  3:02 AM  Result Value Ref Range   Sodium 134 (L) 135 - 145 mmol/L   Potassium 5.0 3.5 - 5.1 mmol/L   Chloride 96 (L) 98 - 111 mmol/L   CO2 28 22 - 32 mmol/L    Glucose, Bld 167 (H) 70 - 99 mg/dL   BUN 26 (H) 8 - 23 mg/dL   Creatinine, Ser 1.07 0.61 - 1.24 mg/dL   Calcium 8.5 (L) 8.9 - 10.3 mg/dL   GFR calc non Af Amer >60 >60 mL/min   GFR calc Af Amer >60 >60 mL/min   Anion gap 10 5 - 15    Comment: Performed at [facility], [place] [address] [place], [place] [address]   No results found.     Medical Problem List and Plan: 1.  Impaired mobility, function, and ADLs secondary to debility from  Pseudomonas pneumonia s/p 3rd recent hospitalization for pneumonia- initial hospital stay for COVID pneumonia.   -patient may shower  -ELOS/Goals: 2- 2.5 weeks; goals mod I 2.  Antithrombotics: -DVT/anticoagulation:  Pharmaceutical: Lovenox  -antiplatelet therapy: N/a 3. Chronic back pain/Pain Management: Hydrocodone prn  4. H/o depression/Mood: team to provide ego support and help with anxiety.   -antipsychotic agents: N/A 5. Neuropsych: This patient is capable of making decisions on his own behalf. 6. Skin/Wound Care: routine pressure relief measures.  7. Fluids/Electrolytes/Nutrition: Monitor I/O. Check lytes in am.  8. 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 Bactrim DS MWF for PJP prophylaxis. Duonebs tid 10. OSA: Encourage CPAP use at nights.  11. Pharyngeal mucositis: MMW Qid (was TID) and Nystatin mouth wash qid and trial of nebulized lidocaine added on  2/1. Will also add Diflucan 200 mg x 1 and 100 daily x 6 days.   12. CKD Stage IIIa: Monitor BMET with serial checks.  52. H/o recurrent GIB/chronic gastritis 05/2018: PPI recommended indefinitely-- will resume with recent use of NSAIDs as well as steroids on board.  14. Chronic diastolic CHF- will monitor daily weights and treat as required.    Bary Leriche, PA-C 02/23/2019   I have personally performed a face to face diagnostic evaluation of this patient and formulated  the key components  of the plan.  Additionally, I have personally reviewed laboratory data, imaging studies, as well as relevant notes and concur with the physician assistant's documentation above.   The patient's status has not changed from the original H&P.  Any changes in documentation from the acute care chart have been noted above.

## 2019-02-23 NOTE — Progress Notes (Addendum)
NAME:  Kurt [name], MRN:  [id number], DOB:  September 13, 1950, LOS: 7 ADMISSION DATE:  02/15/2019, CONSULTATION DATE:  02/16/2019 REFERRING MD:  Danielle Rankin, CHIEF COMPLAINT: Recurrent  Pneumonia   Brief History   Kurt [name] is a 69 y.o. male with medical history significant of chronic diastolic congestive heart failure, hypertension, CKD stage III, recent COVID pneumonia (12/6-12/16/ 2020) and recurrent pneumonia requiring admission 12/28-01/23/2019, presenting again to the ED on 02/15/2019  for evaluation of shortness of breath. He has had CXR in ED which was + for MF pneumonia. CT angiogram chest negative for PE. Showed  worsening extensive bilateral groundglass airspace opacities concerning for multifocal pneumonia.  18 mm nodule in the right middle lobe, nodule was 21 mm on prior study and partially cavitary.The pulmonary service  has been asked to consult for recurrent pneumonia post COVID.  History of present illness   Kurt [name] is a 69 y.o. male never smoker with medical history significant of chronic diastolic congestive heart failure, hypertension, CKD stage III, recent COVID pneumonia presenting to the ED on 1/26  for evaluation of shortness of breath and increasing oxygen needs.  Patient states he was diagnosed with COVID-19 initially around Thanksgiving time last year.  Since then he has been admitted twice.  He was discharged home on 2 L supplemental oxygen but continues to feel very dyspneic even with minimal ambulation.  His oxygen requirement has gone up to 6 L.  Now even on 6 L supplemental oxygen, his oxygen saturation drops to the 40s with minimal ambulation at home.  He is feeling very weak and fell a few days ago.  It took him 45 minutes to get up from the floor.  No loss of consciousness or head injury.  His left hip was slightly sore at the time he fell but the pain has now resolved and he is able to walk without any difficulty.  His temperature has been in the 98 to 66F range at home.   States he has never had lung problems previously and is a never smoker.  He has been taking steroids for several weeks now with no improvement in his breathing.  Denies hemoptysis.  He also recently finished a 3-week course of Augmentin.  Reports having green-colored diarrhea and intermittent abdominal cramps for the past 1 week.  Denies nausea or vomiting.  Denies melena or hematochezia.  He is not on any blood thinners and does not use any over-the-counter NSAIDs.  Patient was admitted to the hospital from 12/6-12/16 for acute hypoxic respiratory failure secondary to COVID-19 pneumonia.  He was treated with steroids and remdesivir.  Discharged home on 2 L supplemental oxygen.  Admitted again on 12/28-1/2 for worsening dyspnea and hypoxia.  CT angiogram done during this hospitalization negative for PE but did show 2 cavitary nodules in both upper lobes measuring up to 2.5 cm concerning for bacterial or fungal pneumonia, septic pulmonary emboli thought to be less likely.  His presentation was thought to be due to respiratory sequela from Covid and chronic sinusitis infection.  Treated with Augmentin x 3 weeks for chronic sinusitis and a slow steroid taper for sequela of Covid pneumonia.  ED Course: Febrile with T-max 100.5 F.  Slightly tachycardic.  Tachypneic with respiratory rate up to 30s.  Not hypotensive.  Oxygen saturation in the low 90s on 6 L supplemental oxygen.  WBC count 11.3.  Lactic acid level normal.  Procalcitonin level <0.10.  High-sensitivity troponin x2 negative.  BNP pending.  MRSA PCR screen pending.  Sputum Gram stain with abundant gram-positive cocci, moderate gram-negative cocci bacilli, and few gram-negative rods.  Sputum culture pending.  UA and urine culture pending.  Blood culture x 2 pending, C diff pending. CRP 11.9, D-dimer 1.10, LDH 248, Flu A& B negative, WBC is 11.3, Fibrinogen 671, Ferritin 131, QuantiFERON TB is pending Chest x-ray personally reviewed showing diffuse  interstitial prominence and patchy bilateral airspace disease similar to prior study, findings concerning for multifocal pneumonia. CT angiogram chest negative for PE.  Showing worsening extensive bilateral groundglass airspace opacities concerning for multifocal pneumonia.  18 mm nodule in the right middle lobe, nodule was 21 mm on prior study and partially cavitary.  Mildly enlarged right paratracheal lymph nodes, stable. ED provider discussed the case with infectious disease, will consult in a.m. Recommended treating with cefepime and linezolid for HCAP. Patient received Tylenol, Percocet, cefepime, linezolid, and a 500 cc normal saline bolus.    Past Medical History    Past Medical History:  Diagnosis Date  . Arthritis   . CHF (congestive heart failure) (HCC)    diastolic dysfunction  . Chronic back pain   . COVID-19   . Depression   . Diverticulitis   . GI bleed 05/2018  . Hypertension   . PTSD (post-traumatic stress disorder)    after  a Fall   . Renal insufficiency    Decrease in GFR- PCP removed all NSAIDS & aspirin     Significant Hospital Events   November 2020>>COVID +  Admission 12/6-12/16/ 2020  Admission 12/28-01/23/2019 Admission 02/16/2019>>  Consults:  02/16/2019>> Pulmonary 02/16/2019>> ID  Procedures:    Significant Diagnostic Tests:  02/16/2019>> CTA Worsening extensive bilateral ground-glass airspace opacities most compatible with pneumonia. 18 mm nodule in the right middle lobe. This nodule was 21 mm on prior study and partially cavitary. Mildly enlarged right paratracheal lymph nodes, stable. No evidence of pulmonary embolus. Cardiomegaly.  02/15/2019 CXR Diffuse interstitial prominence and patchy bilateral airspace disease, similar to prior study. Findings concerning for multifocal pneumonia  Micro Data:  02/16/2019 Sputum: GS: abundant gram-positive cocci, moderate gram-negative cocci bacilli, and few gram-negative rods.   Culture:>>pending 1/26 COVID>> Negative 1/26 Respiratory Viral Panel >> Negative  Antimicrobials:  Maxipime 02/15/2019>>> Linezolid 02/15/2019>>   Interim history/subjective:  Looks good this AM! Still c/o sinus congestion and sore throat particularly on right, exacerbated by lying down flat or coughing.  Objective   Blood pressure 137/67, pulse 95, temperature 99.3 F (37.4 C), temperature source Oral, resp. rate 20, height 5' 10.5" (1.791 m), weight 120.5 kg, SpO2 96 %.        Intake/Output Summary (Last 24 hours) at 02/23/2019 0743 Last data filed at 02/22/2019 2225 Gross per 24 hour  Intake 460 ml  Output 1450 ml  Net -990 ml   Filed Weights   02/20/19 0502 02/21/19 0623 02/22/19 0644  Weight: 120.2 kg 120.2 kg 120.5 kg   Physical Exam: GEN: elderly man in NAD HEENT: irritated dry posterior airway, do not see any remaining thrush CV: RRR, ext warm PULM:clear, no accessory muscle use GI: Soft, +BS EXT: No edema NEURO: Moves all 4 ext to command PSYCH: very talkative, AOx3 SKIN: No rashes   Resolved Hospital Problem list     Assessment & Plan:   # Post COVID ILD- history, labs, bronch, imaging c/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  Elevated inflammatory markers and lymphocytic BAL.  This has been complicated by severe muscular deconditioning from prolonged hospitalization.  He also gets frustrated  easily and down on himself which limits progress.  # RML lesion- warrants OP f/u in around 4 weeks, potential biopsy down the line.  AFB/fungal cultures negative to date.  Possibly related to pseudomonas  # Pseudomonas pneumonia vs. Bronchitis- on cefepime  # Likely oropharyngeal thrush- worsened by CPAP  # OSA not on CPAP- wearing to some effect here, no immediate impact on morbidity.  Only real effect is that his O2 will be titrated up at night if he does not wear the CPAP.  - for pharyngeal mucositis and sinus draingage, encourage water intake, try nebulized lidocaine, cepacol, trial of  singulair - Continue magic mouthwash/nystatin as written for thrush seen earlier in stay - Encourage IS - Prednisone 26m/day x 1 mo then drop to 40 for a month then drop to 20 for a month - Bactrim DS MWF for PJP ppx, check BMP around once a week to make sure K/Cr can handle this - CT chest around 2/26 for RML nodule f/u - f/u remaining bronch cultures - Encourage PT, OOB, increase O2 as needed to maximize mobility comfort - I am okay with patient holding off on CPAP as it seems to worsen his mucositis - Will arrange f/u with Dr. MVaughan Brownerwho has been working with our post-covid inflammatory lung disease cohort - I think patient would do well in inpatient rehab, hopefully he qualifies for this - I am available PRN, please reach out if questions or concerns  DErskine EmeryMD PCCM

## 2019-02-23 NOTE — Discharge Summary (Signed)
Physician Discharge Summary  Kurt [name] ZOX:[id number] DOB: 1950-06-08 DOA: 02/15/2019  PCP: Leonard Downing, MD  Admit date: 02/15/2019 Discharge date: 02/23/2019  Admitted From: Home  Disposition:  CIR   Recommendations for Outpatient Follow-up:  1. Follow up with PCP in 1-2 weeks 2. Please obtain BMP/CBC in one week 3. He will needs antibiotics until 2-7, Cefepime or could transition to CIpro.  4. Monitor renal function closely on Bactrim.  5. Follow up fungus, AFB culture form BAL.  6. Needs to follow up with pulmonologist, continue with long taper dose of prednisone: 6o mg daily for one month, then 40 mg daily for a month then 20 mg daily.     Discharge Condition: Stable.  CODE STATUS: Full code. Diet recommendation: Heart Healthy  Brief/Interim Summary: 69 year old with past medical history significant for chronic diastolic heart failure, hypertension, CKD stage III, recent Covid pneumonia, diagnosed during Thanksgiving.  Since Covid diagnosis patient has been admitted twice, treated for pneumonia.  His oxygen requirement has increased to 6 L of oxygen from 2 L on discharge.  He reports shortness of breath on exertion, increased oxygen requirement on exertion. Note patient was admitted from 12/6 1-12/16 for acute hypoxic respiratory failure secondary to COVID-19 pneumonia.  He was treated with remdesivir and steroids.  Readmitted 12/28 on ~1/2 for worsening dyspnea and hypoxemia treated for pneumonia at that time with Augmentin for 3 weeks. Presented with worsening shortness of breath, fever, CT angio negative for PE but showed bilateral groundglass opacity.    Discharge Diagnoses:  Principal Problem:   Cavitary pneumonia Active Problems:   Hypertension   History of COVID-19   Acute respiratory failure with hypoxia (HCC)   CHF (congestive heart failure) (HCC)   Acute on chronic respiratory failure (HCC)   Multifocal pneumonia   Diarrhea   Respiratory failure, acute  ([place])   COVID-19   1-Acute hypoxic Respiratory Failure; secondary to pneumonia -Ct angio; worsening bilateral ground glass opacities most compatible with PNA.18 mm nodule right middle lobe. Negative for PE>  -Treated initially  with cefepime and linezolid. Subsequently linezolid stopped.  -SARS coronavirus 2 - -Respiratory  panel negative. -QuantiFERON-TB gold negative.  -Follow blood cultures: No growth to date.  -Underwent  Bronchoscopy 1-28 -No evidence of aspiration on swallow eval.  -BAL; culture pending, AFB smear negative, AFB culture pending.  Culture no growth. Fungus pending.  -Cefepime stop by ID 1-29. Resume 1-30 -Patient still desats with minimal exertion.  -CIR, consulted, awaiting insurance.  -Sputum culture from 1-26 grew few Pseudomonas.   -ID agree to continue with cefepime or oral cipro last dose 2-07 On prednisone 60 mg daily, will need long taper tx. 60 for one month, 40 for one month then 20 month.  On Schedule nebulizer.   2-Diarrhea: GI pathogen and C. difficile order. Resolved.   3-OSA/OHS undiagnosed: He will need a sleep study. Started  CPAP/  Appreciate Brenda arranging CPAP.   Hypertension: Systolic blood pressure normal range. Resume Norvasc and hold lisinopril.   Chronic diastolic congestive heart failure: Does not appear volume overload BNP normal at 47.  Troponins negative Continue with lasix,.   CKD stage A; cr stable.   Sore throat; Started  fluconazole. Diflucan discontinue by pulmonary .  Worsening sore throat. Pain with swallowing.  On mouthwash and lidocaine PRN sore throat. Marland Kitchen    Discharge Instructions  Discharge Instructions    Diet - low sodium heart healthy   Complete by: As directed    Increase  activity slowly   Complete by: As directed      Allergies as of 02/23/2019   No Known Allergies     Medication List    STOP taking these medications   Acetaminophen-Codeine 300-30 MG tablet   amoxicillin-clavulanate  875-125 MG tablet Commonly known as: AUGMENTIN   lisinopril 20 MG tablet Commonly known as: ZESTRIL     TAKE these medications   acetaminophen 500 MG tablet Commonly known as: TYLENOL Take 1,000 mg by mouth every 4 (four) hours as needed for headache (pain).   albuterol 108 (90 Base) MCG/ACT inhaler Commonly known as: VENTOLIN HFA Inhale 2 puffs into the lungs every 4 (four) hours as needed for wheezing or shortness of breath.   allopurinol 100 MG tablet Commonly known as: ZYLOPRIM Take 100 mg by mouth 2 (two) times a day.   amLODipine 2.5 MG tablet Commonly known as: NORVASC Take 2.5 mg by mouth daily.   benzonatate 100 MG capsule Commonly known as: Tessalon Perles Take 1 capsule (100 mg total) by mouth 3 (three) times daily as needed for cough.   ceFEPIme 2 g in sodium chloride 0.9 % 100 mL Inject 2 g into the vein every 8 (eight) hours.   chlorpheniramine-HYDROcodone 10-8 MG/5ML Suer Commonly known as: TUSSIONEX Take 5 mLs by mouth every 12 (twelve) hours as needed for cough.   ferrous sulfate 324 MG Tbec Take 324 mg by mouth daily with breakfast.   fluticasone 50 MCG/ACT nasal spray Commonly known as: FLONASE Place 2 sprays into both nostrils daily.   furosemide 40 MG tablet Commonly known as: LASIX Take 1 tablet (40 mg total) by mouth daily. Start taking on: February 24, 2019   ipratropium-albuterol 0.5-2.5 (3) MG/3ML Soln Commonly known as: DUONEB Take 3 mLs by nebulization 3 (three) times daily.   lidocaine 4 % (PF) injection Commonly known as: XYLOCAINE Inhale 4 mLs into the lungs 2 (two) times daily.   magic mouthwash w/lidocaine Soln Take 5 mLs by mouth 3 (three) times daily.   montelukast 10 MG tablet Commonly known as: SINGULAIR Take 1 tablet (10 mg total) by mouth at bedtime.   nystatin 100000 UNIT/ML suspension Commonly known as: MYCOSTATIN Take 5 mLs (500,000 Units total) by mouth 4 (four) times daily.   oxymetazoline 0.05 % nasal  spray Commonly known as: AFRIN Place 1 spray into both nostrils 2 (two) times daily.   pantoprazole 40 MG tablet Commonly known as: PROTONIX Take 1 tablet (40 mg total) by mouth 2 (two) times daily for 30 days.   polyethylene glycol 17 g packet Commonly known as: MIRALAX / GLYCOLAX Take 17 g by mouth daily as needed for mild constipation.   predniSONE 20 MG tablet Commonly known as: DELTASONE Take 3 tablets (60 mg total) by mouth daily with breakfast. Start taking on: February 24, 2019 What changed:   medication strength  how much to take  additional instructions  Another medication with the same name was removed. Continue taking this medication, and follow the directions you see here.   sertraline 100 MG tablet Commonly known as: ZOLOFT Take 1 tablet (100 mg total) by mouth daily. What changed:   how much to take  when to take this   sulfamethoxazole-trimethoprim 800-160 MG tablet Commonly known as: BACTRIM DS Take 1 tablet by mouth 3 (three) times a week. Start taking on: February 24, 2019            Durable Medical Equipment  (From admission, onward)  Start     Ordered   02/19/19 1211  For home use only DME continuous positive airway pressure (CPAP)  Once    Comments: Settings 5-15 with ramp and humidification  Question Answer Comment  Length of Need Lifetime   Patient has OSA or probable OSA Yes   Is the patient currently using CPAP in the home No   Settings Autotitration   Signs and symptoms of probable OSA  (select all that apply) Snoring   Signs and symptoms of probable OSA  (select all that apply) Witnessed apneas   CPAP supplies needed Mask, headgear, cushions, filters, heated tubing and water chamber      02/19/19 1216         Follow-up Information    [facility] Follow up.   Why: CPAP Contact information: [address] [place] [address] [phone]          No Known  Allergies  Consultations:  Pulmonology  ID   Procedures/Studies: DG Chest 2 View  Result Date: 02/15/2019 CLINICAL DATA:  Shortness of breath EXAM: CHEST - 2 VIEW COMPARISON:  01/18/2019 FINDINGS: Cardiomegaly. Diffuse interstitial prominence and patchy bilateral airspace disease again noted, similar to prior study. Heart is mildly enlarged. No effusions or acute bony abnormality. IMPRESSION: Diffuse interstitial prominence and patchy bilateral airspace disease, similar to prior study. Findings concerning for multifocal pneumonia. Electronically Signed   By: Rolm Baptise M.D.   On: 02/15/2019 21:32   CT Angio Chest PE W and/or Wo Contrast  Result Date: 02/16/2019 CLINICAL DATA:  Shortness of breath EXAM: CT ANGIOGRAPHY CHEST WITH CONTRAST TECHNIQUE: Multidetector CT imaging of the chest was performed using the standard protocol during bolus administration of intravenous contrast. Multiplanar CT image reconstructions and MIPs were obtained to evaluate the vascular anatomy. CONTRAST:  33m OMNIPAQUE IOHEXOL 350 MG/ML SOLN COMPARISON:  01/19/2019 FINDINGS: Cardiovascular: No filling defects in the pulmonary arteries to suggest pulmonary emboli. Mild cardiomegaly. Aorta normal caliber. Mediastinum/Nodes: Mildly prominent right paratracheal lymph nodes with index node measuring 14 mm, stable since prior study. No axillary or hilar adenopathy. Lungs/Pleura: Extensive ground-glass airspace opacities throughout both lungs, worsening since prior study. No effusions. 18 mm nodule in the right middle lobe. This was present on prior study, partially cavitary and measuring 21 mm at that time. Upper Abdomen: Imaging into the upper abdomen shows no acute findings. Musculoskeletal: Chest wall soft tissues are unremarkable. No acute bony abnormality. Review of the MIP images confirms the above findings. IMPRESSION: Worsening extensive bilateral ground-glass airspace opacities most compatible with pneumonia. 18 mm  nodule in the right middle lobe. This nodule was 21 mm on prior study and partially cavitary. Mildly enlarged right paratracheal lymph nodes, stable. No evidence of pulmonary embolus. Cardiomegaly. Electronically Signed   By: KRolm BaptiseM.D.   On: 02/16/2019 02:12    (Echo, Carotid, EGD, Colonoscopy, ERCP)    Subjective:   Discharge Exam: Vitals:   02/23/19 0731 02/23/19 0800  BP: 137/67 127/64  Pulse: 95   Resp:    Temp: 99.3 F (37.4 C)   SpO2: 96% 93%     General: Pt is alert, awake, not in acute distress Cardiovascular: RRR, S1/S2 +, no rubs, no gallops Respiratory: CTA bilaterally, no wheezing, no rhonchi Abdominal: Soft, NT, ND, bowel sounds + Extremities: no edema, no cyanosis    The results of significant diagnostics from this hospitalization (including imaging, microbiology, ancillary and laboratory) are listed below for reference.     Microbiology: Recent  Results (from the past 240 hour(s))  Blood Culture (routine x 2)     Status: None   Collection Time: 02/15/19 11:04 PM   Specimen: BLOOD RIGHT HAND  Result Value Ref Range Status   Specimen Description BLOOD RIGHT HAND  Final   Special Requests   Final    BOTTLES DRAWN AEROBIC AND ANAEROBIC Blood Culture results may not be optimal due to an inadequate volume of blood received in culture bottles   Culture   Final    NO GROWTH 5 DAYS Performed at [facility], [place] [address], [place], [place] [address]    Report Status 02/20/2019 FINAL  Final  Expectorated sputum assessment w rflx to resp cult     Status: None   Collection Time: 02/16/19 12:02 AM   Specimen: Sputum  Result Value Ref Range Status   Specimen Description SPUTUM  Final   Special Requests NONE  Final   Sputum evaluation   Final    THIS SPECIMEN IS ACCEPTABLE FOR SPUTUM CULTURE Performed at [facility], [place] [address], [place], [place] [address]    Report Status 02/20/2019 FINAL  Final  Culture, respiratory     Status: None    Collection Time: 02/16/19 12:02 AM   Specimen: SPU  Result Value Ref Range Status   Specimen Description SPUTUM  Final   Special Requests NONE Reflexed from [id number]  Final   Gram Stain   Final    MODERATE WBC PRESENT, PREDOMINANTLY PMN FEW SQUAMOUS EPITHELIAL CELLS PRESENT ABUNDANT GRAM POSITIVE COCCI MODERATE GRAM NEGATIVE COCCOBACILLI FEW GRAM NEGATIVE RODS FEW GRAM POSITIVE RODS Performed at [facility], [place] [address] [place], [place] [address]    Culture FEW PSEUDOMONAS AERUGINOSA  Final   Report Status 02/22/2019 FINAL  Final   Organism ID, Bacteria PSEUDOMONAS AERUGINOSA  Final      Susceptibility   Pseudomonas aeruginosa - MIC*    CEFTAZIDIME <=1 SENSITIVE Sensitive     CIPROFLOXACIN <=0.25 SENSITIVE Sensitive     GENTAMICIN <=1 SENSITIVE Sensitive     IMIPENEM <=0.25 SENSITIVE Sensitive     PIP/TAZO <=4 SENSITIVE Sensitive     * FEW PSEUDOMONAS AERUGINOSA  Blood Culture (routine x 2)     Status: None   Collection Time: 02/16/19  1:30 AM   Specimen: BLOOD LEFT ARM  Result Value Ref Range Status   Specimen Description BLOOD LEFT ARM  Final   Special Requests   Final    BOTTLES DRAWN AEROBIC AND ANAEROBIC Blood Culture adequate volume   Culture   Final    NO GROWTH 5 DAYS Performed at [facility], [place] [address], [place], [place] [address]    Report Status 02/21/2019 FINAL  Final  Urine culture     Status: None   Collection Time: 02/16/19  3:05 AM   Specimen: In/Out Cath Urine  Result Value Ref Range Status   Specimen Description IN/OUT CATH URINE  Final   Special Requests NONE  Final   Culture   Final    NO GROWTH Performed at [facility], [place] [address], [place], [place] [address]    Report Status 02/16/2019 FINAL  Final  SARS CORONAVIRUS 2 (TAT 6-24 HRS) Nasopharyngeal Nasal Mucosa     Status: None   Collection Time: 02/16/19  3:30 AM   Specimen: Nasal Mucosa; Nasopharyngeal  Result Value Ref Range Status   SARS Coronavirus 2  NEGATIVE NEGATIVE Final    Comment: (NOTE)  SARS-CoV-2 target nucleic acids are NOT DETECTED. The SARS-CoV-2 RNA is generally detectable in upper and lower respiratory specimens during the acute phase of infection. Negative results do not preclude SARS-CoV-2 infection, do not rule out co-infections with other pathogens, and should not be used as the sole basis for treatment or other patient management decisions. Negative results must be combined with clinical observations, patient history, and epidemiological information. The expected result is Negative. Fact Sheet for Patients: SugarRoll.be Fact Sheet for Healthcare Providers: https://www.woods-mathews.com/ This test is not yet approved or cleared by the [place] FDA and  has been authorized for detection and/or diagnosis of SARS-CoV-2 by FDA under an Emergency Use Authorization (EUA). This EUA will remain  in effect (meaning this test can be used) for the duration of the COVID-19 declaration under Section 56 4(b)(1) of the Act, 21 U.S.C. section 360bbb-3(b)(1), unless the authorization is terminated or revoked sooner. Performed at [facility], [place] [address] [place], [place] [address]   Respiratory Panel by PCR     Status: None   Collection Time: 02/16/19  4:05 AM   Specimen: Flu Kit Nasopharyngeal Swab; Respiratory  Result Value Ref Range Status   Adenovirus NOT DETECTED NOT DETECTED Final   Coronavirus 229E NOT DETECTED NOT DETECTED Final    Comment: (NOTE) The Coronavirus on the Respiratory Panel, DOES NOT test for the novel  Coronavirus (2019 nCoV)    Coronavirus HKU1 NOT DETECTED NOT DETECTED Final   Coronavirus NL63 NOT DETECTED NOT DETECTED Final   Coronavirus OC43 NOT DETECTED NOT DETECTED Final   Metapneumovirus NOT DETECTED NOT DETECTED Final   Rhinovirus / Enterovirus NOT DETECTED NOT DETECTED Final   Influenza A NOT DETECTED NOT DETECTED Final   Influenza B NOT  DETECTED NOT DETECTED Final   Parainfluenza Virus 1 NOT DETECTED NOT DETECTED Final   Parainfluenza Virus 2 NOT DETECTED NOT DETECTED Final   Parainfluenza Virus 3 NOT DETECTED NOT DETECTED Final   Parainfluenza Virus 4 NOT DETECTED NOT DETECTED Final   Respiratory Syncytial Virus NOT DETECTED NOT DETECTED Final   Bordetella pertussis NOT DETECTED NOT DETECTED Final   Chlamydophila pneumoniae NOT DETECTED NOT DETECTED Final   Mycoplasma pneumoniae NOT DETECTED NOT DETECTED Final    Comment: Performed at [facility], [place]. [address] [place], [place] [address]  MRSA PCR Screening     Status: None   Collection Time: 02/16/19  5:21 AM   Specimen: Nasal Mucosa; Nasopharyngeal  Result Value Ref Range Status   MRSA by PCR NEGATIVE NEGATIVE Final    Comment:        The GeneXpert MRSA Assay (FDA approved for NASAL specimens only), is one component of a comprehensive MRSA colonization surveillance program. It is not intended to diagnose MRSA infection nor to guide or monitor treatment for MRSA infections. Performed at [facility], [place] [address] [place], [place] [address]   Culture, bal-quantitative     Status: None   Collection Time: 02/18/19  4:55 PM   Specimen: Bronchoalveolar Lavage; Respiratory  Result Value Ref Range Status   Specimen Description BRONCHIAL ALVEOLAR LAVAGE  Final   Special Requests RIGHT MIDDLE LOBE  Final   Gram Stain   Final    FEW WBC PRESENT, PREDOMINANTLY MONONUCLEAR NO ORGANISMS SEEN    Culture   Final    NO GROWTH 2 DAYS Performed at [facility], [place] [address] [place],  [address]    Report Status 02/21/2019 FINAL  Final  Acid Fast Smear (AFB)     Status: None   Collection Time: 02/18/19  4:55 PM   Specimen: Bronchial Alveolar Lavage  Result Value Ref Range Status   AFB Specimen Processing Concentration  Final   Acid Fast Smear Negative  Final    Comment: (NOTE) Performed At: [facility] [place], [place] [id number] Rush Farmer MD [id number]    Source (AFB) BRONCHIAL ALVEOLAR LAVAGE  Final    Comment: Performed at [facility], [place] [address] [place], [place] [address]  Fungus Culture With Stain     Status: None (Preliminary result)   Collection Time: 02/18/19  4:55 PM   Specimen: Bronchial Alveolar Lavage  Result Value Ref Range Status   Fungus Stain Final report  Final    Comment: (NOTE) Performed At: [facility] [place], [place] [id number] Rush Farmer MD [id number]    Fungus (Mycology) Culture PENDING  Incomplete   Fungal Source BRONCHIAL ALVEOLAR LAVAGE  Final    Comment: Performed at [facility], [place] [address] [place], [place] [address]  Fungus Culture Result     Status: None   Collection Time: 02/18/19  4:55 PM  Result Value Ref Range Status   Result 1 Comment  Final    Comment: (NOTE) KOH/Calcofluor preparation:  no fungus observed. Performed At: [facility] [place], [place] [id number] Rush Farmer MD [id number]   Respiratory Panel by PCR     Status: None   Collection Time: 02/18/19  9:00 PM   Specimen: Bronchial Alveolar Lavage; Respiratory  Result Value Ref Range Status   Adenovirus NOT DETECTED NOT DETECTED Final   Coronavirus 229E NOT DETECTED NOT DETECTED Final    Comment: (NOTE) The Coronavirus on the Respiratory Panel, DOES NOT test for the novel  Coronavirus (2019 nCoV)    Coronavirus HKU1 NOT DETECTED NOT DETECTED Final   Coronavirus NL63 NOT DETECTED NOT DETECTED Final   Coronavirus OC43 NOT DETECTED NOT DETECTED Final   Metapneumovirus NOT DETECTED NOT DETECTED Final   Rhinovirus / Enterovirus NOT DETECTED NOT DETECTED Final   Influenza A NOT DETECTED NOT DETECTED Final   Influenza B NOT DETECTED NOT DETECTED Final   Parainfluenza Virus 1 NOT DETECTED NOT DETECTED Final   Parainfluenza Virus 2 NOT DETECTED NOT DETECTED Final   Parainfluenza Virus 3 NOT  DETECTED NOT DETECTED Final   Parainfluenza Virus 4 NOT DETECTED NOT DETECTED Final   Respiratory Syncytial Virus NOT DETECTED NOT DETECTED Final   Bordetella pertussis NOT DETECTED NOT DETECTED Final   Chlamydophila pneumoniae NOT DETECTED NOT DETECTED Final   Mycoplasma pneumoniae NOT DETECTED NOT DETECTED Final    Comment: Performed at [facility], [place] [address] [place], [place] [address]  Culture, group A strep     Status: None   Collection Time: 02/20/19  4:13 PM   Specimen: Throat  Result Value Ref Range Status   Specimen Description THROAT  Final   Special Requests NONE  Final   Culture   Final    NO GROUP A STREP (S.PYOGENES) ISOLATED Performed at [facility], [place] [address], [place], [place] [address]    Report Status 02/22/2019 FINAL  Final  Group A Strep by PCR     Status: None   Collection Time: 02/21/19 11:17 AM   Specimen: Throat; Sterile Swab  Result Value Ref Range Status   Group A Strep by PCR NOT DETECTED NOT  DETECTED Final    Comment: Performed at [facility], [place] [address] [place], [place] [address]     Labs: BNP (last 3 results) Recent Labs    01/18/19 1215 02/15/19 2240  BNP 20.8 82.9   Basic Metabolic Panel: Recent Labs  Lab 02/17/19 0355 02/19/19 0506 02/20/19 0428 02/22/19 0416 02/23/19 0302  NA 137 136 137 138 134*  K 4.2 4.4 4.9 4.7 5.0  CL 104 102 101 98 96*  CO2 _0 GLUCOSE 111* 86 161* 121* 167*  BUN _1 26*  CREATININE 0.96 0.89 0.99 1.05 1.07  CALCIUM 8.4* 8.4* 8.7* 8.9 8.5*   Liver Function Tests: No results for input(s): AST, ALT, ALKPHOS, BILITOT, PROT, ALBUMIN in the last 168 hours. No results for input(s): LIPASE, AMYLASE in the last 168 hours. No results for input(s): AMMONIA in the last 168 hours. CBC: Recent Labs  Lab 02/17/19 0355 02/19/19 0506 02/20/19 0428  WBC 11.1* 9.2 8.1  NEUTROABS 9.4*  --   --   HGB 10.5* 11.3* 11.4*  HCT 34.2* 36.7* 36.3*  MCV 87.9 87.2 86.6   PLT 395 474* 500*   Cardiac Enzymes: No results for input(s): CKTOTAL, CKMB, CKMBINDEX, TROPONINI in the last 168 hours. BNP: Invalid input(s): POCBNP CBG: No results for input(s): GLUCAP in the last 168 hours. D-Dimer No results for input(s): DDIMER in the last 72 hours. Hgb A1c No results for input(s): HGBA1C in the last 72 hours. Lipid Profile No results for input(s): CHOL, HDL, LDLCALC, TRIG, CHOLHDL, LDLDIRECT in the last 72 hours. Thyroid function studies No results for input(s): TSH, T4TOTAL, T3FREE, THYROIDAB in the last 72 hours.  Invalid input(s): FREET3 Anemia work up No results for input(s): VITAMINB12, FOLATE, FERRITIN, TIBC, IRON, RETICCTPCT in the last 72 hours. Urinalysis    Component Value Date/Time   COLORURINE YELLOW 02/16/2019 0316   APPEARANCEUR CLEAR 02/16/2019 0316   LABSPEC 1.029 02/16/2019 0316   PHURINE 6.0 02/16/2019 0316   GLUCOSEU NEGATIVE 02/16/2019 0316   HGBUR NEGATIVE 02/16/2019 0316   BILIRUBINUR NEGATIVE 02/16/2019 0316   KETONESUR NEGATIVE 02/16/2019 0316   PROTEINUR NEGATIVE 02/16/2019 0316   UROBILINOGEN 1.0 07/09/2012 0010   NITRITE NEGATIVE 02/16/2019 0316   LEUKOCYTESUR NEGATIVE 02/16/2019 0316   Sepsis Labs Invalid input(s): PROCALCITONIN,  WBC,  LACTICIDVEN Microbiology Recent Results (from the past 240 hour(s))  Blood Culture (routine x 2)     Status: None   Collection Time: 02/15/19 11:04 PM   Specimen: BLOOD RIGHT HAND  Result Value Ref Range Status   Specimen Description BLOOD RIGHT HAND  Final   Special Requests   Final    BOTTLES DRAWN AEROBIC AND ANAEROBIC Blood Culture results may not be optimal due to an inadequate volume of blood received in culture bottles   Culture   Final    NO GROWTH 5 DAYS Performed at [facility], [place] [address] [place] [address], [place], [place] [address]    Report Status 02/20/2019 FINAL  Final  Expectorated sputum assessment w rflx to resp cult     Status: None   Collection Time: 02/16/19 12:02  AM   Specimen: Sputum  Result Value Ref Range Status   Specimen Description SPUTUM  Final   Special Requests NONE  Final   Sputum evaluation   Final    THIS SPECIMEN IS ACCEPTABLE FOR SPUTUM CULTURE Performed at [facility], [place] [address], [place], [place] [address]    Report  Status 02/20/2019 FINAL  Final  Culture, respiratory     Status: None   Collection Time: 02/16/19 12:02 AM   Specimen: SPU  Result Value Ref Range Status   Specimen Description SPUTUM  Final   Special Requests NONE Reflexed from [id number]  Final   Gram Stain   Final    MODERATE WBC PRESENT, PREDOMINANTLY PMN FEW SQUAMOUS EPITHELIAL CELLS PRESENT ABUNDANT GRAM POSITIVE COCCI MODERATE GRAM NEGATIVE COCCOBACILLI FEW GRAM NEGATIVE RODS FEW GRAM POSITIVE RODS Performed at [facility], [place] [address] [place], [place] [address]    Culture FEW PSEUDOMONAS AERUGINOSA  Final   Report Status 02/22/2019 FINAL  Final   Organism ID, Bacteria PSEUDOMONAS AERUGINOSA  Final      Susceptibility   Pseudomonas aeruginosa - MIC*    CEFTAZIDIME <=1 SENSITIVE Sensitive     CIPROFLOXACIN <=0.25 SENSITIVE Sensitive     GENTAMICIN <=1 SENSITIVE Sensitive     IMIPENEM <=0.25 SENSITIVE Sensitive     PIP/TAZO <=4 SENSITIVE Sensitive     * FEW PSEUDOMONAS AERUGINOSA  Blood Culture (routine x 2)     Status: None   Collection Time: 02/16/19  1:30 AM   Specimen: BLOOD LEFT ARM  Result Value Ref Range Status   Specimen Description BLOOD LEFT ARM  Final   Special Requests   Final    BOTTLES DRAWN AEROBIC AND ANAEROBIC Blood Culture adequate volume   Culture   Final    NO GROWTH 5 DAYS Performed at [facility], [place] [address], [place], [place] [address]    Report Status 02/21/2019 FINAL  Final  Urine culture     Status: None   Collection Time: 02/16/19  3:05 AM   Specimen: In/Out Cath Urine  Result Value Ref Range Status   Specimen Description IN/OUT CATH URINE  Final   Special Requests NONE  Final    Culture   Final    NO GROWTH Performed at [facility], [place] [address], [place], [place] [address]    Report Status 02/16/2019 FINAL  Final  SARS CORONAVIRUS 2 (TAT 6-24 HRS) Nasopharyngeal Nasal Mucosa     Status: None   Collection Time: 02/16/19  3:30 AM   Specimen: Nasal Mucosa; Nasopharyngeal  Result Value Ref Range Status   SARS Coronavirus 2 NEGATIVE NEGATIVE Final    Comment: (NOTE) SARS-CoV-2 target nucleic acids are NOT DETECTED. The SARS-CoV-2 RNA is generally detectable in upper and lower respiratory specimens during the acute phase of infection. Negative results do not preclude SARS-CoV-2 infection, do not rule out co-infections with other pathogens, and should not be used as the sole basis for treatment or other patient management decisions. Negative results must be combined with clinical observations, patient history, and epidemiological information. The expected result is Negative. Fact Sheet for Patients: SugarRoll.be Fact Sheet for Healthcare Providers: https://www.woods-mathews.com/ This test is not yet approved or cleared by the [place] FDA and  has been authorized for detection and/or diagnosis of SARS-CoV-2 by FDA under an Emergency Use Authorization (EUA). This EUA will remain  in effect (meaning this test can be used) for the duration of the COVID-19 declaration under Section 56 4(b)(1) of the Act, 21 U.S.C. section 360bbb-3(b)(1), unless the authorization is terminated or revoked sooner. Performed at [facility], [place] [address] [place], [place] [address]   Respiratory Panel by PCR     Status: None   Collection Time: 02/16/19  4:05 AM   Specimen: Flu Kit Nasopharyngeal  Swab; Respiratory  Result Value Ref Range Status   Adenovirus NOT DETECTED NOT DETECTED Final   Coronavirus 229E NOT DETECTED NOT DETECTED Final    Comment: (NOTE) The Coronavirus on the Respiratory Panel, DOES NOT test for the  novel  Coronavirus (2019 nCoV)    Coronavirus HKU1 NOT DETECTED NOT DETECTED Final   Coronavirus NL63 NOT DETECTED NOT DETECTED Final   Coronavirus OC43 NOT DETECTED NOT DETECTED Final   Metapneumovirus NOT DETECTED NOT DETECTED Final   Rhinovirus / Enterovirus NOT DETECTED NOT DETECTED Final   Influenza A NOT DETECTED NOT DETECTED Final   Influenza B NOT DETECTED NOT DETECTED Final   Parainfluenza Virus 1 NOT DETECTED NOT DETECTED Final   Parainfluenza Virus 2 NOT DETECTED NOT DETECTED Final   Parainfluenza Virus 3 NOT DETECTED NOT DETECTED Final   Parainfluenza Virus 4 NOT DETECTED NOT DETECTED Final   Respiratory Syncytial Virus NOT DETECTED NOT DETECTED Final   Bordetella pertussis NOT DETECTED NOT DETECTED Final   Chlamydophila pneumoniae NOT DETECTED NOT DETECTED Final   Mycoplasma pneumoniae NOT DETECTED NOT DETECTED Final    Comment: Performed at [facility], [place] [address] [place], [place] [address]  MRSA PCR Screening     Status: None   Collection Time: 02/16/19  5:21 AM   Specimen: Nasal Mucosa; Nasopharyngeal  Result Value Ref Range Status   MRSA by PCR NEGATIVE NEGATIVE Final    Comment:        The GeneXpert MRSA Assay (FDA approved for NASAL specimens only), is one component of a comprehensive MRSA colonization surveillance program. It is not intended to diagnose MRSA infection nor to guide or monitor treatment for MRSA infections. Performed at [facility], [place] [address], [place], [place] [address]   Culture, bal-quantitative     Status: None   Collection Time: 02/18/19  4:55 PM   Specimen: Bronchoalveolar Lavage; Respiratory  Result Value Ref Range Status   Specimen Description BRONCHIAL ALVEOLAR LAVAGE  Final   Special Requests RIGHT MIDDLE LOBE  Final   Gram Stain   Final    FEW WBC PRESENT, PREDOMINANTLY MONONUCLEAR NO ORGANISMS SEEN    Culture   Final    NO GROWTH 2 DAYS Performed at [facility], [place] [address],  [place], [place] [address]    Report Status 02/21/2019 FINAL  Final  Acid Fast Smear (AFB)     Status: None   Collection Time: 02/18/19  4:55 PM   Specimen: Bronchial Alveolar Lavage  Result Value Ref Range Status   AFB Specimen Processing Concentration  Final   Acid Fast Smear Negative  Final    Comment: (NOTE) Performed At: [facility] [place], [place] [id number] Rush Farmer MD [id number]    Source (AFB) BRONCHIAL ALVEOLAR LAVAGE  Final    Comment: Performed at [facility], [place] [address] [place], [place] [address]  Fungus Culture With Stain     Status: None (Preliminary result)   Collection Time: 02/18/19  4:55 PM   Specimen: Bronchial Alveolar Lavage  Result Value Ref Range Status   Fungus Stain Final report  Final    Comment: (NOTE) Performed At: [facility] [place], [place] [id number] Rush Farmer MD [id number]    Fungus (Mycology) Culture PENDING  Incomplete   Fungal Source BRONCHIAL ALVEOLAR LAVAGE  Final    Comment: Performed at [facility], [place] [address], [place], [place] [address]  Fungus Culture  Result     Status: None   Collection Time: 02/18/19  4:55 PM  Result Value Ref Range Status   Result 1 Comment  Final    Comment: (NOTE) KOH/Calcofluor preparation:  no fungus observed. Performed At: [facility] [place], [place] [id number] Rush Farmer MD [id number]   Respiratory Panel by PCR     Status: None   Collection Time: 02/18/19  9:00 PM   Specimen: Bronchial Alveolar Lavage; Respiratory  Result Value Ref Range Status   Adenovirus NOT DETECTED NOT DETECTED Final   Coronavirus 229E NOT DETECTED NOT DETECTED Final    Comment: (NOTE) The Coronavirus on the Respiratory Panel, DOES NOT test for the novel  Coronavirus (2019 nCoV)    Coronavirus HKU1 NOT DETECTED NOT DETECTED Final   Coronavirus NL63 NOT DETECTED NOT DETECTED Final   Coronavirus OC43 NOT DETECTED  NOT DETECTED Final   Metapneumovirus NOT DETECTED NOT DETECTED Final   Rhinovirus / Enterovirus NOT DETECTED NOT DETECTED Final   Influenza A NOT DETECTED NOT DETECTED Final   Influenza B NOT DETECTED NOT DETECTED Final   Parainfluenza Virus 1 NOT DETECTED NOT DETECTED Final   Parainfluenza Virus 2 NOT DETECTED NOT DETECTED Final   Parainfluenza Virus 3 NOT DETECTED NOT DETECTED Final   Parainfluenza Virus 4 NOT DETECTED NOT DETECTED Final   Respiratory Syncytial Virus NOT DETECTED NOT DETECTED Final   Bordetella pertussis NOT DETECTED NOT DETECTED Final   Chlamydophila pneumoniae NOT DETECTED NOT DETECTED Final   Mycoplasma pneumoniae NOT DETECTED NOT DETECTED Final    Comment: Performed at [facility], [place] [address], [place], [place] [address]  Culture, group A strep     Status: None   Collection Time: 02/20/19  4:13 PM   Specimen: Throat  Result Value Ref Range Status   Specimen Description THROAT  Final   Special Requests NONE  Final   Culture   Final    NO GROUP A STREP (S.PYOGENES) ISOLATED Performed at [facility], [place] [address] [place], [place] [address]    Report Status 02/22/2019 FINAL  Final  Group A Strep by PCR     Status: None   Collection Time: 02/21/19 11:17 AM   Specimen: Throat; Sterile Swab  Result Value Ref Range Status   Group A Strep by PCR NOT DETECTED NOT DETECTED Final    Comment: Performed at [facility], [place] [address] [place], [place] [address]     Time coordinating discharge: 40 minutes  SIGNED:   Elmarie Shiley, MD  [facility]

## 2019-02-23 NOTE — Progress Notes (Signed)
Courtney Heys, MD  Physician  Physical Medicine and Rehabilitation  PMR Pre-admission  Signed  Date of Service:  02/22/2019  2:24 PM      Related encounter: ED to Hosp-Admission (Current) from 02/15/2019 in [facility]      Signed        Show:Clear all '[x]' Manual'[x]' Template'[]' Copied  Added by: '[x]' Julious Payer Vertis Kelch, RN'[x]' Courtney Heys, MD  '[]' Hover for details PMR Admission Coordinator Pre-Admission Assessment   Patient: Kurt [name] is an 69 y.o., male MRN: [id number] DOB: November 08, 1950 Height: 5' 10.5" (179.1 cm) Weight: 120.1 kg   Insurance Information HMO:     PPO:  yes    PCP:      IPA:      80/20:      OTHER:  PRIMARY: Blue Medicare      Policy#: [id number]      Subscriber: pt CM Name: Shanon      Phone#: [phone]     Fax#: [phone] Pre-Cert#: TBD approved for 7 days      Employer:  Benefits:  Phone #: [phone]     Name: 02/22/2019 Eff. Date: 01/22/2019     Deduct: none      Out of Pocket Max: $5900      Life Max: none CIR: #335 co pay per day days 1 until 6      SNF: no co pay per day days 1 until 20; $184 co pay per day days 21 until 60; no co pay days 61- 100 Outpatient: $40 per visit     Co-Pay: visits per medical neccesity Home Health: 100%      Co-Pay: visits per medical neccesity DME: 80%     Co-Pay: 20% Providers: in network    SECONDARY: none         Medicaid Application Date:       Case Manager:  Disability Application Date:       Case Worker:    The "Data Collection Information Summary" for patients in Inpatient Rehabilitation Facilities with attached "Privacy Act [place] Records" was provided and verbally reviewed with: Patient   Emergency Contact Information         Contact Information     Name Relation Home Work Mobile    Neamiah, Sciarra     [phone]    Turitt,Stacy Daughter [phone]   [phone]    Ellis,Crystal Daughter [phone]   [phone]    RAEVON, BROOM     [phone]        Current Medical History  Patient Admitting Diagnosis: Debility , PNA with history of COVID 19   History of Present Illness: 69 year old male with history of Chronic diastolic congestive heart failure, HTN, CKD stage III, recent COVID PNA presenting to ED on 02/16/2019 for evaluation of SOB. He was initially dx COVID around Thanksgiving and has been readmitted twice . On O2 at home since COVID, but felt very dyspneic with minimal exertion. Oxygen requirements up to 6 liter with desaturation to 40's at home.    Last admit 12/6 until 12/16 for acute respiratory failure treated with steroids and Remdesivir and then second admit 12/28 until 01/23/2019 for worsening hypoxia and dyspnea. Admits felt due to respiratory sequela from COVID and chronic sinus infection. Treated for 3 weeks with Augmentin and slow steroid taper. CT angio negative for PE but showed bilateral ground glass opacity.     CT angio worsening of bilateral glass opacities most compatible with PNA. 18 mm nodule RML. Treated  initially with Cefepime and linezolid. Blood cultures negative to date. Underwent Bronchoscopy 1/28. No aspiration on swallow eval. Patient still desats with exertion. Sputum culture form 1/26 grew few gram negative rods. Culture incubated and resumed IV Cefepime. ON prednisone 60 mg daily and will need longer taper ts. On scheduled nebulizer.       Started fluconazole with sore throat. D/c'd by pulmonary. added mouthwash and lidocaine for sore throat.    Patient's medical record from [facility] has been reviewed by the rehabilitation admission coordinator and physician.   Past Medical History      Past Medical History:  Diagnosis Date  . Arthritis    . CHF (congestive heart failure) (HCC)      diastolic dysfunction  . Chronic back pain    . COVID-19    . Depression    . Diverticulitis    . GI bleed 05/2018  . Hypertension    . PTSD (post-traumatic stress disorder)      after  a Fall   .  Renal insufficiency      Decrease in GFR- PCP removed all NSAIDS & aspirin       Family History   family history is not on file.   Prior Rehab/Hospitalizations Has the patient had prior rehab or hospitalizations prior to admission? Yes   Has the patient had major surgery during 100 days prior to admission? No               Current Medications   Current Facility-Administered Medications:  .  0.9 %  sodium chloride infusion, , Intravenous, PRN, Regalado, Belkys A, MD, Last Rate: 10 mL/hr at 02/23/19 0626, 250 mL at 02/23/19 0626 .  acetaminophen (TYLENOL) tablet 650 mg, 650 mg, Oral, Q6H PRN, Shela Leff, MD, 650 mg at 02/20/19 2213 .  albuterol (PROVENTIL) (2.5 MG/3ML) 0.083% nebulizer solution 3 mL, 3 mL, Inhalation, Q4H PRN, Shela Leff, MD .  allopurinol (ZYLOPRIM) tablet 100 mg, 100 mg, Oral, BID, Regalado, Belkys A, MD, 100 mg at 02/23/19 0811 .  amLODipine (NORVASC) tablet 2.5 mg, 2.5 mg, Oral, Daily, Regalado, Belkys A, MD, 2.5 mg at 02/23/19 0811 .  ceFEPIme (MAXIPIME) 2 g in sodium chloride 0.9 % 100 mL IVPB, 2 g, Intravenous, Q8H, Randleman, Stephanie N, NP, Last Rate: 200 mL/hr at 02/23/19 0627, 2 g at 02/23/19 0627 .  chlorpheniramine-HYDROcodone (TUSSIONEX) 10-8 MG/5ML suspension 5 mL, 5 mL, Oral, Q12H PRN, Shela Leff, MD .  enoxaparin (LOVENOX) injection 40 mg, 40 mg, Subcutaneous, Q0600, Margaretha Seeds, MD, 40 mg at 02/23/19 4098 .  ferrous sulfate tablet 325 mg, 325 mg, Oral, Q breakfast, Regalado, Belkys A, MD, 325 mg at 02/23/19 0811 .  furosemide (LASIX) tablet 40 mg, 40 mg, Oral, Daily, Regalado, Belkys A, MD, 40 mg at 02/23/19 0811 .  guaiFENesin-dextromethorphan (ROBITUSSIN DM) 100-10 MG/5ML syrup 10 mL, 10 mL, Oral, Q4H PRN, Shela Leff, MD .  HYDROcodone-acetaminophen (NORCO/VICODIN) 5-325 MG per tablet 1-2 tablet, 1-2 tablet, Oral, Q6H PRN, Bodenheimer, Charles A, NP, 2 tablet at 02/22/19 2212 .  ipratropium-albuterol (DUONEB) 0.5-2.5 (3)  MG/3ML nebulizer solution 3 mL, 3 mL, Nebulization, TID, Regalado, Belkys A, MD, 3 mL at 02/23/19 0732 .  ketorolac (TORADOL) 15 MG/ML injection 15 mg, 15 mg, Intravenous, Q8H PRN, Regalado, Belkys A, MD, 15 mg at 02/22/19 2213 .  lidocaine (XYLOCAINE) 4 % (PF) injection 4 mL, 4 mL, Inhalation, BID, Candee Furbish, MD .  magic mouthwash w/lidocaine, 5 mL, Oral, TID, Tamala Julian,  Darnelle Maffucci, MD, 5 mL at 02/23/19 [phone] .  MEDLINE mouth rinse, 15 mL, Mouth Rinse, BID, Regalado, Belkys A, MD, 15 mL at 02/23/19 1000 .  menthol-cetylpyridinium (CEPACOL) lozenge 3 mg, 1 lozenge, Oral, TID, Candee Furbish, MD, 3 mg at 02/23/19 1000 .  montelukast (SINGULAIR) tablet 10 mg, 10 mg, Oral, QHS, Candee Furbish, MD .  nystatin (MYCOSTATIN) 100000 UNIT/ML suspension 500,000 Units, 5 mL, Oral, QID, Candee Furbish, MD, 500,000 Units at 02/23/19 1200 .  pantoprazole (PROTONIX) EC tablet 40 mg, 40 mg, Oral, BID, Regalado, Belkys A, MD, 40 mg at 02/23/19 0812 .  phenol (CHLORASEPTIC) mouth spray 1 spray, 1 spray, Mouth/Throat, PRN, Bodenheimer, Charles A, NP .  predniSONE (DELTASONE) tablet 60 mg, 60 mg, Oral, Q breakfast, 60 mg at 02/23/19 0811 **FOLLOWED BY** [START ON 03/24/2019] predniSONE (DELTASONE) tablet 40 mg, 40 mg, Oral, Q breakfast **FOLLOWED BY** [START ON 04/23/2019] predniSONE (DELTASONE) tablet 20 mg, 20 mg, Oral, Q breakfast, Candee Furbish, MD .  sertraline (ZOLOFT) tablet 200 mg, 200 mg, Oral, q morning - 10a, Regalado, Belkys A, MD, 200 mg at 02/23/19 0812 .  sodium chloride (OCEAN) 0.65 % nasal spray 1 spray, 1 spray, Each Nare, PRN, Regalado, Belkys A, MD, 1 spray at 02/22/19 1848 .  sulfamethoxazole-trimethoprim (BACTRIM DS) 800-160 MG per tablet 1 tablet, 1 tablet, Oral, Once per day on Mon Wed Fri, Candee Furbish, MD, 1 tablet at 02/22/19 2831   Patients Current Diet:     Diet Order                      Diet Heart Room service appropriate? Yes; Fluid consistency: Thin  Diet effective now                     Precautions / Restrictions Precautions Precautions: Fall Precaution Comments: monitor O2 ( used 6 liters for activity and 3 at rest) Restrictions Weight Bearing Restrictions: No    Has the patient had 2 or more falls or a fall with injury in the past year? Yes   Prior Activity Level Community (5-7x/wk): was completely Independent prior to COVID dx 11/20. retired, driving   Prior Functional Level Self Care: Did the patient need help bathing, dressing, using the toilet or eating? Independent   Indoor Mobility: Did the patient need assistance with walking from room to room (with or without device)? Independent   Stairs: Did the patient need assistance with internal or external stairs (with or without device)? Independent   Functional Cognition: Did the patient need help planning regular tasks such as shopping or remembering to take medications? Independent   Home Assistive Devices / Equipment Home Assistive Devices/Equipment: Environmental consultant (specify type) Home Equipment: Shower seat, Environmental consultant - 4 wheels, Walker - 2 wheels, Cane - single point   Prior Device Use: Indicate devices/aids used by the patient prior to current illness, exacerbation or injury? None of the above   Current Functional Level Cognition   Overall Cognitive Status: Within Functional Limits for tasks assessed Orientation Level: Oriented X4 General Comments: very motivated to return to PLOF (active--going to water therapy 4 days a week prior to [place] in Nov 2020)    Extremity Assessment (includes Sensation/Coordination)   Upper Extremity Assessment: Generalized weakness RUE Deficits / Details: ROM WFL; MMT 5/5 except elbow 4/5 from chronic injury LUE Deficits / Details: ROM WFL; MMT shoulder and elbow 4/5 from chronic injury  Lower Extremity Assessment: Defer to PT  evaluation     ADLs   Overall ADL's : Needs assistance/impaired Eating/Feeding: Independent Grooming: Wash/dry hands, Wash/dry face, Oral  care, Supervision/safety, Standing Upper Body Bathing: Set up, Sitting Lower Body Bathing: Sit to/from stand, Minimal assistance Upper Body Dressing : Set up, Sitting Lower Body Dressing: Minimal assistance, Sitting/lateral leans Lower Body Dressing Details (indicate cue type and reason): education on LB dressing compensatory method of using figure four method to don pants as ECS Toilet Transfer: Stand-pivot, Copy Details (indicate cue type and reason): bed<>recliner, no AD Toileting- Clothing Manipulation and Hygiene: Set up, Sitting/lateral lean Functional mobility during ADLs: Min guard, Minimal assistance, Rolling walker General ADL Comments: With use of AE (reacher and sock aid) pt with less SOB than without AE     Mobility   Overal bed mobility: (pt received and left in recliner) Bed Mobility: Supine to Sit Supine to sit: Supervision General bed mobility comments: pt up in recliner upon my arrival     Transfers   Overall transfer level: Needs assistance Equipment used: None Transfers: Sit to/from Stand, Stand Pivot Transfers Sit to Stand: Supervision Stand pivot transfers: Supervision(management of lines) Squat pivot transfers: Min guard General transfer comment: Pt is able to do stand pivots at a S level however with just this activity he gets SOB even on 6 liters and sats dropped as low as 84% with education on purse lipped breathing to gets sats back up into mid 90's on 6 liters.     Ambulation / Gait / Stairs / Wheelchair Mobility   Ambulation/Gait Ambulation/Gait assistance: Counsellor (Feet): 60 Feet(x1 45x1) Assistive device: Rolling walker (2 wheeled) Gait Pattern/deviations: Step-through pattern, Decreased stride length, Wide base of support General Gait Details: O2 increased to 6LO2 via [place], pt maintain SPO2 >92% on 6LO2 during amb. Pt required 1 seated rest break but demo'd increased ambulation tolerance Gait velocity:  dec Gait velocity interpretation: <1.31 ft/sec, indicative of household ambulator     Posture / Balance Dynamic Sitting Balance Sitting balance - Comments: modI Balance Overall balance assessment: Needs assistance Sitting-balance support: No upper extremity supported, Feet supported Sitting balance-Leahy Scale: Good Sitting balance - Comments: modI Standing balance support: Single extremity supported Standing balance-Leahy Scale: Fair Standing balance comment: dependent on UE support for ambulation, pt okay with static standing     Special needs/care consideration BiPAP/CPAP  Needs outpt sleep study; new to CPAP CPM  Continuous Drip IV  Dialysis          Life Vest  Oxygen at 6 liters HF [place] with activity, 3 liters at rest Special Bed  Trach Size  Wound Vac Skin                                Bowel mgmt: continent Bladder mgmt: continent Diabetic mgmt:  Behavioral consideration  Chemo/radiation  visitor is  Son , Heath Lark    Previous Home Environment  Living Arrangements: Alone  Lives With: Alone Available Help at Discharge: Family, Available PRN/intermittently Type of Home: House Home Layout: One level Home Access: Stairs to enter Entrance Stairs-Rails: (one middle rail in center of steps) Entrance Stairs-Number of Steps: 7 with one handrail in midline Bathroom Shower/Tub: Chiropodist: Standard Bathroom Accessibility: Yes How Accessible: Accessible via walker [place]: No   Discharge Living Setting Plans for Discharge Living Setting: Patient's home, Alone Type of Home at Discharge: House Discharge Home Layout: One  level Discharge Home Access: Stairs to enter Entrance Stairs-Rails: (rail in middle of steps) Entrance Stairs-Number of Steps: 7 Discharge Bathroom Shower/Tub: Tub/shower unit Discharge Bathroom Toilet: Standard Discharge Bathroom Accessibility: Yes How Accessible: Accessible via walker Does the patient have any problems  obtaining your medications?: No   Social/Family/Support Systems Patient Roles: Parent Contact Information: son, [name] Anticipated Caregiver: children prn Anticipated Caregiver's Contact Information: see above Ability/Limitations of Caregiver: children work so can stop by in evenings after work Building control surveyor Availability: Evenings only Discharge Plan Discussed with Primary Caregiver: Yes Is Caregiver In Agreement with Plan?: Yes Does Caregiver/Family have Issues with Lodging/Transportation while Pt is in Rehab?: No   Goals/Additional Needs Patient/Family Goal for Rehab: Mod I with PT and OT Expected length of stay: ELOS 2 weeks Special Service Needs: has been readmitted 3 times since initial dx COVID `11/2018 Pt/Family Agrees to Admission and willing to participate: Yes Program Orientation Provided & Reviewed with Pt/Caregiver Including Roles  & Responsibilities: Yes   Decrease burden of Care through IP rehab admission:    Possible need for SNF placement upon discharge:    Patient Condition: I have reviewed medical records from [facility] , spoken with CM, and patient. I met with patient at the bedside for inpatient rehabilitation assessment.  Patient will benefit from ongoing PT and OT, can actively participate in 3 hours of therapy a day 5 days of the week, and can make measurable gains during the admission.  Patient will also benefit from the coordinated team approach during an Inpatient Acute Rehabilitation admission.  The patient will receive intensive therapy as well as Rehabilitation physician, nursing, social worker, and care management interventions.  Due to bladder management, bowel management, safety, skin/wound care, disease management, medication administration, pain management and patient education the patient requires 24 hour a day rehabilitation nursing.  The patient is currently min assist with mobility and basic ADLs.  Discharge setting and therapy post discharge at  home with home health is anticipated.  Patient has agreed to participate in the Acute Inpatient Rehabilitation Program and will admit today.   Preadmission Screen Completed By:  Cleatrice Burke, 02/23/2019 12:12 PM ______________________________________________________________________   Discussed status with Dr. Dagoberto Ligas on 02/23/2019 at  1214 and received approval for admission today.   Admission Coordinator:  Cleatrice Burke, RN, time  9147 Date  02/23/2019    Assessment/Plan: Diagnosis: 1. Does the need for close, 24 hr/day Medical supervision in concert with the patient's rehab needs make it unreasonable for this patient to be served in a less intensive setting? Yes 2. Co-Morbidities requiring supervision/potential complications: possible pulmonary fibrosis, cavitary lesions in lung, on Cefipime for pseudomonas pneumonia, thrush, CKD Stage III, HTN, chronic diastolic CHF 3. Due to bowel management, safety, skin/wound care, disease management, medication administration, pain management and patient education, does the patient require 24 hr/day rehab nursing? Yes 4. Does the patient require coordinated care of a physician, rehab nurse, PT, OT, and SLP to address physical and functional deficits in the context of the above medical diagnosis(es)? Yes Addressing deficits in the following areas: balance, endurance, locomotion, strength, transferring, bathing, dressing, feeding, grooming, toileting and psychosocial support 5. Can the patient actively participate in an intensive therapy program of at least 3 hrs of therapy 5 days a week? Yes 6. The potential for patient to make measurable gains while on inpatient rehab is good 7. Anticipated functional outcomes upon discharge from inpatient rehab: modified independent PT, modified independent OT, n/a SLP 8. Estimated rehab  length of stay to reach the above functional goals is: 2 weeks 9. Anticipated discharge destination: Home 10. Overall  Rehab/Functional Prognosis: good     MD Signature:          Revision History

## 2019-02-23 NOTE — Progress Notes (Signed)
Inpatient Rehabilitation Admissions Coordinator  I have insurance approval to admit patient to CIR today. I met with patient at beside and he is in agreement. I will contact Dr. Tyrell Antonio to arrange and update SW, Caren Griffins.  Danne Baxter, RN, MSN Rehab Admissions Coordinator [phone] 02/23/2019 12:05 PM

## 2019-02-23 NOTE — Progress Notes (Signed)
Lidocaine neb given per MD order. Patient stated that it did help with his throat. BID neb order entered per MD request. Will give tonight before placing on CPAP. RT to monitor as needed

## 2019-02-23 NOTE — Plan of Care (Signed)
  Problem: Education: Goal: Knowledge of General Education information will improve Description: Including pain rating scale, medication(s)/side effects and non-pharmacologic comfort measures 02/23/2019 0806 by Tomasa Hosteller, RN Outcome: Progressing 02/23/2019 0805 by Tomasa Hosteller, RN Outcome: Progressing   Problem: Health Behavior/Discharge Planning: Goal: Ability to manage health-related needs will improve 02/23/2019 0806 by Tomasa Hosteller, RN Outcome: Progressing 02/23/2019 0805 by Tomasa Hosteller, RN Outcome: Progressing   Problem: Clinical Measurements: Goal: Ability to maintain clinical measurements within normal limits will improve 02/23/2019 0806 by Tomasa Hosteller, RN Outcome: Progressing 02/23/2019 0805 by Tomasa Hosteller, RN Outcome: Progressing Goal: Will remain free from infection Outcome: Progressing   Problem: Nutrition: Goal: Adequate nutrition will be maintained Outcome: Progressing

## 2019-02-24 ENCOUNTER — Inpatient Hospital Stay (HOSPITAL_COMMUNITY): Payer: Medicare Other

## 2019-02-24 ENCOUNTER — Inpatient Hospital Stay (HOSPITAL_COMMUNITY): Payer: Medicare Other | Admitting: Occupational Therapy

## 2019-02-24 ENCOUNTER — Other Ambulatory Visit: Payer: Self-pay

## 2019-02-24 DIAGNOSIS — I5030 Unspecified diastolic (congestive) heart failure: Secondary | ICD-10-CM | POA: Diagnosis present

## 2019-02-24 DIAGNOSIS — R5381 Other malaise: Principal | ICD-10-CM

## 2019-02-24 LAB — CBC WITH DIFFERENTIAL/PLATELET
Abs Immature Granulocytes: 0.16 10*3/uL — ABNORMAL HIGH (ref 0.00–0.07)
Basophils Absolute: 0 10*3/uL (ref 0.0–0.1)
Basophils Relative: 0 %
Eosinophils Absolute: 0 10*3/uL (ref 0.0–0.5)
Eosinophils Relative: 0 %
HCT: 39.6 % (ref 39.0–52.0)
Hemoglobin: 12.1 g/dL — ABNORMAL LOW (ref 13.0–17.0)
Immature Granulocytes: 1 %
Lymphocytes Relative: 11 %
Lymphs Abs: 1.5 10*3/uL (ref 0.7–4.0)
MCH: 27 pg (ref 26.0–34.0)
MCHC: 30.6 g/dL (ref 30.0–36.0)
MCV: 88.4 fL (ref 80.0–100.0)
Monocytes Absolute: 1 10*3/uL (ref 0.1–1.0)
Monocytes Relative: 8 %
Neutro Abs: 10.6 10*3/uL — ABNORMAL HIGH (ref 1.7–7.7)
Neutrophils Relative %: 80 %
Platelets: 542 10*3/uL — ABNORMAL HIGH (ref 150–400)
RBC: 4.48 MIL/uL (ref 4.22–5.81)
RDW: 15.3 % (ref 11.5–15.5)
WBC: 13.3 10*3/uL — ABNORMAL HIGH (ref 4.0–10.5)
nRBC: 0 % (ref 0.0–0.2)

## 2019-02-24 LAB — COMPREHENSIVE METABOLIC PANEL
ALT: 75 U/L — ABNORMAL HIGH (ref 0–44)
AST: 40 U/L (ref 15–41)
Albumin: 3.3 g/dL — ABNORMAL LOW (ref 3.5–5.0)
Alkaline Phosphatase: 62 U/L (ref 38–126)
Anion gap: 14 (ref 5–15)
BUN: 25 mg/dL — ABNORMAL HIGH (ref 8–23)
CO2: 32 mmol/L (ref 22–32)
Calcium: 9.3 mg/dL (ref 8.9–10.3)
Chloride: 95 mmol/L — ABNORMAL LOW (ref 98–111)
Creatinine, Ser: 1.12 mg/dL (ref 0.61–1.24)
GFR calc Af Amer: >60 mL/min (ref >60)
GFR calc non Af Amer: >60 mL/min (ref >60)
Glucose, Bld: 80 mg/dL (ref 70–99)
Potassium: 4.9 mmol/L (ref 3.5–5.1)
Sodium: 141 mmol/L (ref 135–145)
Total Bilirubin: 1.2 mg/dL (ref 0.3–1.2)
Total Protein: 6.6 g/dL (ref 6.5–8.1)

## 2019-02-24 NOTE — Progress Notes (Signed)
Inpatient Rehabilitation  Patient information reviewed and entered into eRehab system by Esiquio Boesen M. Gena Laski, M.A., CCC/SLP, PPS Coordinator.  Information including medical coding, functional ability and quality indicators will be reviewed and updated through discharge.    

## 2019-02-24 NOTE — Progress Notes (Signed)
RT went to place pt on cpap unit, pt wasn't ready. RT told pt to let RN know when he was ready to be placed on unit and they will call. Pt also stated he did not want the lidocaine treatment.

## 2019-02-24 NOTE — Evaluation (Signed)
Occupational Therapy Assessment and Plan  Patient Details  Name: Kurt [name] MRN: [id number] Date of Birth: 02-17-1950  OT Diagnosis: muscle weakness (generalized) Rehab Potential: Rehab Potential (ACUTE ONLY): Good ELOS: 7-10days   Today's Date: 02/24/2019 OT Individual Time: 1000-1115 OT Individual Time Calculation (min): 75 min     Problem List:  Patient Active Problem List   Diagnosis Date Noted  . Diastolic CHF ([place]) 93/23/5573  . Physical debility 02/23/2019  . COVID-19   . Cavitary pneumonia 02/17/2019  . Multifocal pneumonia 02/16/2019  . Diarrhea 02/16/2019  . Respiratory failure, acute ([place]) 02/16/2019  . Pulmonary nodule   . OSA (obstructive sleep apnea) 01/20/2019  . Acute renal failure superimposed on stage 3a chronic kidney disease ([place]) 01/20/2019  . Chronic sinusitis 01/20/2019  . Acute on chronic respiratory failure ([place]) 01/18/2019  . Failure to thrive in adult 01/18/2019  . Major depression, chronic 01/18/2019  . Obesity, Class III, BMI 40-49.9 (morbid obesity) ([place]) 01/18/2019  . History of COVID-19 12/27/2018  . Acute respiratory failure with hypoxia ([place]) 12/27/2018  . CKD (chronic kidney disease) stage 3, GFR 30-59 ml/min 12/27/2018  . CHF (congestive heart failure) ([place]) 12/27/2018  . QT prolongation 06/15/2018  . 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 GI bleed 04/29/2014  . Acute kidney injury superimposed on CKD ([place]) 04/29/2014  . TIA (transient ischemic attack) 07/08/2012  . Diverticulitis 07/08/2012  . Hypertension 07/08/2012    Past Medical History:  Past Medical History:  Diagnosis Date  . Arthritis   . CHF (congestive heart failure) (H[facility])    diastolic dysfunction  . Chronic back pain   . COVID-19   . Depression   . Diverticulitis   . Duodenal ulcer 05/2018   Treated with 3 clips/Dr. Henrene Pastor  . Fall off scafolding 2003   prolonged  hospitalization- multiple BUE surgery, concussion  . GI bleed 05/2018  . GIB (gastrointestinal bleeding) 2016   Antral ulcer/duodenal bleed per Endoscopy by Dr. Benson Norway  . Hypertension   . PTSD (post-traumatic stress disorder)    after  a Fall   . Renal insufficiency    Decrease in GFR- PCP removed all NSAIDS & aspirin    Past Surgical History:  Past Surgical History:  Procedure Laterality Date  . BIOPSY  06/16/2018   Procedure: BIOPSY;  Surgeon: Irene Shipper, MD;  Location: [facility] ENDOSCOPY;  Service: Endoscopy;;  . ELBOW FRACTURE SURGERY     ORIF, Shoulder manipulation    . ESOPHAGOGASTRODUODENOSCOPY N/A 04/30/2014   Procedure: ESOPHAGOGASTRODUODENOSCOPY (EGD);  Surgeon: Carol Ada, MD;  Location: [facility] ENDOSCOPY;  Service: Endoscopy;  Laterality: N/A;  . ESOPHAGOGASTRODUODENOSCOPY (EGD) WITH PROPOFOL N/A 06/16/2018   Procedure: ESOPHAGOGASTRODUODENOSCOPY (EGD) WITH PROPOFOL;  Surgeon: Irene Shipper, MD;  Location: [facility] ENDOSCOPY;  Service: Endoscopy;  Laterality: N/A;  . HEMOSTASIS CLIP PLACEMENT  06/16/2018   Procedure: HEMOSTASIS CLIP PLACEMENT;  Surgeon: Irene Shipper, MD;  Location: [facility] ENDOSCOPY;  Service: Endoscopy;;  . LUMBAR LAMINECTOMY/DECOMPRESSION MICRODISCECTOMY N/A 02/16/2015   Procedure: Thoracic ten - thoracic twelve laminectomy;  Surgeon: Earnie Larsson, MD;  Location: [place] NEURO ORS;  Service: Neurosurgery;  Laterality: N/A;  . LUMBAR LAMINECTOMY/DECOMPRESSION MICRODISCECTOMY N/A 03/22/2016   Procedure: Lumbar One-Two, Lumbar Two-Three, Lumbar Three-Four Laminectomy and Foraminotomy;  Surgeon: Earnie Larsson, MD;  Location: [place];  Service: Neurosurgery;  Laterality: N/A;  . NASAL SEPTUM SURGERY    . SCLEROTHERAPY  06/16/2018   Procedure: SCLEROTHERAPY;  Surgeon: Irene Shipper, MD;  Location: [facility] ENDOSCOPY;  Service: Endoscopy;;  . VIDEO BRONCHOSCOPY Bilateral 02/18/2019   Procedure: VIDEO BRONCHOSCOPY WITHOUT FLUORO;  Surgeon: Margaretha Seeds, MD;  Location: [place];  Service: Pulmonary;   Laterality: Bilateral;    Assessment & Plan Clinical Impression: Patient is a 69 y.o. year old male R handed male with history of chronic back pain (and back surgery x2), concussion -2003, PTSD, CKD stage IIIa, GIB, chronic diastolic CHF, OSA, Covid 19 dx 12/16/18 with admission 12/6- 12/16 treated with Remsesivir, dexamethasone and d/c to home on 2- 4L oxygen, readmissions on 12/28- 1/2 for worsening of DOE and decreased intake with diagnosis of cavitary lesions BUL and sent home on slow steriod wean as well as 3 weeks Augmentin for sinusitis. He was readmitted on 02/16/19 with fever, tachypnea and CTA chest was negative for PE but showed worsening of bilateral ground glass opacities c/w PNA, 18 mm partially cavitary RML nodule and enlarged right paratracheal lymph nodes. He was started on broad spectrum antibiotics and underwent bronchoscopy 1/28 and no lesions seen with friable easily irritated mucosa.  BAL with no growth and negative for AFB on smear. BC X 2 negative. Sputum culture with few pseudomonas aeruginosa.   ID consulted for input. Dr. Linus Salmons felt that patient's progressive hypoxia with SOB likely due to development of new interstitial disease and not bacterial in nature---may need lung biopsy for definite diagnosis  but to treat pseudomonas PNA with cefepime thorough Feb 9th. He has had reports of sore throat treated with nystatin mouthwash.    Dr. 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  recommends repeat CT chest around 2/26 for follow up on RML nodule, slow taper of steroids--60 mg X 4 weeks, 40 mg X 4 weeks then 20 mg X 4 weeks  Also recommends encouraging IS and Bactrim DS MWF for PJP ppx (weekly BMP X month). Patient refusing CPAP--per pulmonary "CPAP at night; if patient wants a break from this I would D/C night-time oximetry, his O2 will go down with apneic  events which would lead to inappropriate uptitration of night-time O2, patient informed of this"  Therapy ongoing and patient noted to be debilitated with SOB with minimal activity. Currently requires 3L at rest and 6 liters with activity.   Patient transferred to CIR on 02/23/2019 .    Patient currently requires contact guard to supervision with basic self-care skills and basic mobility secondary to muscle weakness, decreased cardiorespiratoy endurance and decreased oxygen support and decreased balance strategies.  Prior to hospitalization, patient could complete ADL with independent  prior to [place].  Patient will benefit from skilled intervention to decrease level of assist with basic self-care skills and increase independence with basic self-care skills prior to discharge home with care partner.  Anticipate patient will require intermittent supervision and follow up home health.  OT - End of Session Activity Tolerance: Tolerates 10 - 20 min activity with multiple rests;Tolerates < 10 min activity with changes in vital signs Endurance Deficit: Yes Endurance Deficit Description: requires 6 liters of O2 with activity OT Assessment Rehab Potential (ACUTE ONLY): Good OT Patient demonstrates impairments in the following area(s): Balance;Endurance OT Basic ADL's Functional Problem(s): Bathing;Dressing;Toileting OT Advanced ADL's Functional Problem(s): Simple Meal Preparation;Laundry;Light Housekeeping OT Transfers Functional Problem(s): Toilet OT Additional Impairment(s): None OT Plan OT Intensity: Minimum of 1-2 x/day, 45 to 90 minutes OT Frequency:  5 out of 7 days OT Duration/Estimated Length of Stay: 7-10days OT Treatment/Interventions: Balance/vestibular training;Self Care/advanced ADL retraining;Therapeutic Exercise;DME/adaptive equipment instruction;UE/LE Strength taining/ROM;Community reintegration;Patient/family education;UE/LE Coordination activities;Functional mobility training;Psychosocial  support;Therapeutic Activities OT Self Feeding Anticipated Outcome(s): n/a OT Basic Self-Care Anticipated Outcome(s): mod I OT Toileting Anticipated Outcome(s): mod I OT Bathroom Transfers Anticipated Outcome(s): mod I OT Recommendation Patient destination: Home Follow Up Recommendations: Home health OT Equipment Recommended: To be determined   Skilled Therapeutic Intervention Eval initiated with OT purpose, role and goals discussed.   Pt already dressed when arrived and reported was able to complete with setup/ supervision. Pt on 3 liters of O2 at rest and sats in mid 90s. With standing up to use the urinal pt desats into the 70s on 3 liters. Pt continues to require increased liters of O2 to maintain upper 80s/ low 90s with activity. Pt ambulated to the dayroom with 4 seated rest breaks on the way on 6 liters of O2. Pt 4/4 dyspnea with activity. After being able to walk to the Nustep performed 7 min on the Nustep with resistive level of 6 on 6 liters of O2 and reports a score of 13 on the BORG. Provided the pt with general exercises he can perform in the room and ones he can do with [place] when staff is present. Pt ambulated back to his room with 2 seated rest breaks again on 6 liters of O2 with dyspnea of 4/4 afterwards with sats remaining in the 80s. Pt left resting in the recliner in his room.   OT Evaluation Precautions/Restrictions  Precautions Precautions: Fall Precaution Comments: monitor O2 ( used 6 liters for activity and 3 at rest) Restrictions Weight Bearing Restrictions: No General Chart Reviewed: Yes Family/Caregiver Present: No Vital Signs Therapy Vitals Pulse Rate: 97 Resp: 18 BP: 125/78 Patient Position (if appropriate): Sitting Oxygen Therapy SpO2: 91 % O2 Device: Nasal Cannula O2 Flow Rate (L/min): 4 L/min Pain  no c/o pain in session  Home Living/Prior [facility] expects to be discharged to:: Private residence Living Arrangements:  Alone Available Help at Discharge: Family, Available PRN/intermittently Type of Home: House Home Access: Stairs to enter [employer] of Steps: 7 with one handrail in midline Home Layout: One level Bathroom Shower/Tub: Chiropodist: Standard  Lives With: Alone ADL ADL Grooming: Modified independent Upper Body Bathing: Not assessed Lower Body Bathing: Not assessed Upper Body Dressing: Independent Where Assessed-Upper Body Dressing: Chair Lower Body Dressing: Setup Where Assessed-Lower Body Dressing: Chair Toileting: Independent, Supervision/safety Toilet Transfer: Therapist, music Method: Magazine features editor: Not assessed Vision Patient Visual Report: No change from baseline Vision Assessment?: No apparent visual deficits Perception  Perception: Within Functional Limits Praxis Praxis: Intact Cognition Overall Cognitive Status: Within Functional Limits for tasks assessed Arousal/Alertness: Awake/alert Orientation Level: Person;Place;Situation Person: Oriented Place: Oriented Situation: Oriented Year: 2021 Month: February Day of Week: Correct Memory: Appears intact Immediate Memory Recall: Sock;Blue;Bed Memory Recall Sock: Without Cue Memory Recall Blue: Without Cue Memory Recall Bed: Without Cue Attention: Alternating Awareness: Appears intact Problem Solving: Appears intact Executive Function: Reasoning;Organizing;Decision Making;Sequencing Reasoning: Appears intact Sequencing: Appears intact Organizing: Appears intact Decision Making: Appears intact Safety/Judgment: Appears intact Sensation Sensation Light Touch: Appears Intact Coordination Gross Motor Movements are Fluid and Coordinated: Yes Fine Motor Movements are Fluid and Coordinated: Yes Motor  Motor Motor - Skilled Clinical Observations: generalized weakness Mobility  Transfers Sit to Stand: Contact Guard/Touching assist Stand to Sit:  Contact Guard/Touching assist  Trunk/Postural Assessment  Cervical Assessment Cervical Assessment: Within Functional Limits Thoracic Assessment Thoracic Assessment: (rounded shoulders) Lumbar Assessment Lumbar Assessment: (previous back sxs- slight lordosis) Postural Control Postural Control: Within Functional Limits  Balance Balance Balance Assessed: Yes Dynamic Sitting Balance Dynamic Sitting - Balance Support: During functional activity Dynamic Sitting - Level of Assistance: 6: Modified independent (Device/Increase time) Static Standing Balance Static Standing - Balance Support: During functional activity Static Standing - Level of Assistance: 6: Modified independent (Device/Increase time) Dynamic Standing Balance Dynamic Standing - Balance Support: During functional activity Dynamic Standing - Level of Assistance: 4: Min assist Extremity/Trunk Assessment RUE Assessment RUE Assessment: Exceptions to [facility] Strength Comments: old injury- shoulder 0-100, limited full elbow extension- able to perform ADL tasks without difficulty- has learn to accomodate. 0-~60 degrees LUE Assessment LUE Assessment: Within Functional Limits     Refer to Care Plan for Long Term Goals  Recommendations for other services: None    Discharge Criteria: Patient will be discharged from OT if patient refuses treatment 3 consecutive times without medical reason, if treatment goals not met, if there is a change in medical status, if patient makes no progress towards goals or if patient is discharged from hospital.  The above assessment, treatment plan, treatment alternatives and goals were discussed and mutually agreed upon: by patient  Nicoletta Ba 02/24/2019, 11:32 AM

## 2019-02-24 NOTE — Progress Notes (Signed)
[place] PHYSICAL MEDICINE & REHABILITATION PROGRESS NOTE   Subjective/Complaints:  Pt reports sore throat a little better but admits it's usually worse at night.  Also frustrated about coughing fits- wants ot know how to treat those- explained sounds like he's wheezing when he's coughing, so has Albuterol nebs for SOB/wheezing and suggest he actually ask for them when coughing a lot or SOB. He didn't appear to understand could ask for those meds.     ROS; pt denies CP, abd pain, N/V/C/D, Headache, vision changes- does endorse sore throat and intermittent SOB when moving or talking a lot   Objective:   No results found. Recent Labs    02/24/19 0651  WBC 13.3*  HGB 12.1*  HCT 39.6  PLT 542*   Recent Labs    02/23/19 0302 02/24/19 0651  NA 134* 141  K 5.0 4.9  CL 96* 95*  CO2 28 32  GLUCOSE 167* 80  BUN 26* 25*  CREATININE 1.07 1.12  CALCIUM 8.5* 9.3    Intake/Output Summary (Last 24 hours) at 02/24/2019 0948 Last data filed at 02/24/2019 0731 Gross per 24 hour  Intake 460 ml  Output 1325 ml  Net -865 ml     Physical Exam: Vital Signs Blood pressure 125/78, pulse 97, temperature 98.7 F (37.1 C), temperature source Oral, resp. rate 18, SpO2 91 %.  Constitutional: He is oriented to person, place, and time. He appears well-developed and well-nourished.  Awake, alert, appropriate, nursing in room at bedside; sitting up in bed; intermittent heavy coughing which sounded wheezy, NAD HENT:  Head: Normocephalic and atraumatic.  Mouth/Throat: Oropharyngeal exudate present.  Back of tongue coated- appears to be thrush, esp with tonsillar area erythematous; localized erythema. Eyes: conjugate gaze Neck: No tracheal deviation present.  Cardiovascular: RRR Respiratory: good air movement overall; but slightly decreased at bases; wheezing WHEN coughing, but not otherwise;  GI: Soft, NT, ND, (+BS Musculoskeletal:        Comments: UEs- deltoid 3-/5 B/L; 5-/5 in arms  biceps, triceps, WE, grip, finger abd B/L LEs- HF, KE, KF, DF, PF all 5-/5 B/L  Neurological: He is alert and oriented to person, place, and time. Decreased sensation to light touch in LEs; and LUE to elbow has decreased sensation to light touch.  Intact sensation in RUE.  Skin: Skin is warm and dry.  Bruise on knee  Psychiatric:  Slightly anxious          Assessment/Plan: 1. Functional deficits secondary to debility from 3rd case of pneumonia since COVID 12/20- currently pseudomonas pneumonia which require 3+ hours per day of interdisciplinary therapy in a comprehensive inpatient rehab setting.  Physiatrist is providing close team supervision and 24 hour management of active medical problems listed below.  Physiatrist and rehab team continue to assess barriers to discharge/monitor patient progress toward functional and medical goals  Care Tool:  Bathing              Bathing assist       Upper Body Dressing/Undressing Upper body dressing        Upper body assist      Lower Body Dressing/Undressing Lower body dressing      What is the patient wearing?: Underwear/pull up, Pants     Lower body assist Assist for lower body dressing: Independent     Toileting Toileting    Toileting assist Assist for toileting: Independent(urinal)     Transfers Chair/bed transfer  Transfers assist     Chair/bed transfer assist level:  Contact Guard/Touching assist     Locomotion Ambulation   Ambulation assist              Walk 10 feet activity   Assist           Walk 50 feet activity   Assist           Walk 150 feet activity   Assist           Walk 10 feet on uneven surface  activity   Assist           Wheelchair     Assist               Wheelchair 50 feet with 2 turns activity    Assist            Wheelchair 150 feet activity     Assist          Blood pressure 125/78, pulse 97, temperature  98.7 F (37.1 C), temperature source Oral, resp. rate 18, SpO2 91 %.  1.  Impaired mobility, function, and ADLs secondary to debility from  Pseudomonas pneumonia s/p 3rd recent hospitalization for pneumonia- initial hospital stay for COVID pneumonia.              -patient may shower             -ELOS/Goals: 2- 2.5 weeks; goals mod I 2.  Antithrombotics: -DVT/anticoagulation:  Pharmaceutical: Lovenox             -antiplatelet therapy: N/a 3. Chronic back pain/Pain Management: Hydrocodone prn  4. H/o depression/Mood: team to provide ego support and help with anxiety.              -antipsychotic agents: N/A 5. Neuropsych: This patient is capable of making decisions on his own behalf. 6. Skin/Wound Care: routine pressure relief measures.  7. Fluids/Electrolytes/Nutrition: Monitor I/O. Check lytes in am.   2/3- lytes and Cr/OK 8. 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 Bactrim DS MWF for PJP prophylaxis. Duonebs tid  2/3- Duonebs made prn - encouraged pt to ask for them when coughing or SOB. 10. OSA: Encourage CPAP use at nights.  11. Pharyngeal mucositis: MMW Qid (was TID) and Nystatin mouth wash qid and trial of nebulized lidocaine added on  2/1. Will also add Diflucan 200 mg x 1 and 100 daily x 6 days.   12. CKD Stage IIIa: Monitor BMET with serial checks.   2/3- Cr 1.12 and BUN slightly elevated at 25- a little dry- will encourage fluid intake.  13. H/o recurrent GIB/chronic gastritis 05/2018: PPI recommended indefinitely-- will resume with recent use of NSAIDs as well as steroids on board.  14. Chronic diastolic CHF- will monitor daily weights and treat as required.   2/3- will order daily weights  15. Leukocytosis  2/3- on Prednisone 60 mg daily currently- could be that, however of note, last WBC was 8.8- so unclear- afebrile- will con't to monitor closely for signs of additional infection.      LOS: 1 days A FACE TO FACE EVALUATION WAS PERFORMED  Kurt [name] 02/24/2019, 9:48 AM

## 2019-02-24 NOTE — Plan of Care (Signed)
  Problem: RH BOWEL ELIMINATION Goal: RH STG MANAGE BOWEL WITH ASSISTANCE Description: STG Manage Bowel with  Min Assistance. Outcome: Progressing Flowsheets (Taken 02/24/2019 1528) STG: Pt will manage bowels with assistance: 5-Supervision/set up Goal: RH STG MANAGE BOWEL W/MEDICATION W/ASSISTANCE Description: STG Manage Bowel with Medication with Min  Assistance. Outcome: Progressing Flowsheets (Taken 02/24/2019 1528) STG: Pt will manage bowels with medication with assistance: 5-Supervision/set up   Problem: RH BLADDER ELIMINATION Goal: RH STG MANAGE BLADDER WITH ASSISTANCE Description: STG Manage Bladder With Min Assistance Outcome: Progressing Flowsheets (Taken 02/24/2019 1528) STG: Pt will manage bladder with assistance: 5-Supervision/set up   Problem: RH SKIN INTEGRITY Goal: RH STG SKIN FREE OF INFECTION/BREAKDOWN Outcome: Progressing   Problem: RH SAFETY Goal: RH STG ADHERE TO SAFETY PRECAUTIONS W/ASSISTANCE/DEVICE Description: STG Adhere to Safety Precautions With Min  Assistance/Device. Outcome: Progressing Flowsheets (Taken 02/24/2019 1528) STG:Pt will adhere to safety precautions with assistance/device: 5-Supervision/set up Goal: RH STG DECREASED RISK OF FALL WITH ASSISTANCE Description: STG Decreased Risk of Fall With [employer]. Outcome: Progressing Flowsheets (Taken 02/24/2019 1528) FOY:DXAJOINOM risk of fall  with assistance/device: 5-Supervison/set up   Problem: RH PAIN MANAGEMENT Goal: RH STG PAIN MANAGED AT OR BELOW PT'S PAIN GOAL Description: Pt will be free of pain Outcome: Progressing   Problem: RH KNOWLEDGE DEFICIT GENERAL Goal: RH STG INCREASE KNOWLEDGE OF SELF CARE AFTER HOSPITALIZATION Outcome: Progressing   Problem: Consults Goal: RH GENERAL PATIENT EDUCATION Description: See Patient Education module for education specifics. Outcome: Progressing

## 2019-02-24 NOTE — Evaluation (Signed)
Physical Therapy Assessment and Plan  Patient Details  Name: Kurt [name] MRN: [id number] Date of Birth: [birth year]  PT Diagnosis: Abnormal posture, Abnormality of gait, Difficulty walking, Impaired sensation and Low back pain Rehab Potential: Excellent ELOS: 7-10 days   Today's Date: 02/24/2019 PT Individual Time: 0800-0905 and 1445-1540 PT Individual Time Calculation (min): 65 min and 55 min    Problem List:  Patient Active Problem List   Diagnosis Date Noted  . Diastolic CHF ([place]) 37/90/2409  . Physical debility 02/23/2019  . COVID-19   . Cavitary pneumonia 02/17/2019  . Multifocal pneumonia 02/16/2019  . Diarrhea 02/16/2019  . Respiratory failure, acute ([place]) 02/16/2019  . Pulmonary nodule   . OSA (obstructive sleep apnea) 01/20/2019  . Acute renal failure superimposed on stage 3a chronic kidney disease ([place]) 01/20/2019  . Chronic sinusitis 01/20/2019  . Acute on chronic respiratory failure ([place]) 01/18/2019  . Failure to thrive in adult 01/18/2019  . Major depression, chronic 01/18/2019  . Obesity, Class III, BMI 40-49.9 (morbid obesity) ([place]) 01/18/2019  . History of COVID-19 12/27/2018  . Acute respiratory failure with hypoxia ([place]) 12/27/2018  . CKD (chronic kidney disease) stage 3, GFR 30-59 ml/min 12/27/2018  . CHF (congestive heart failure) ([place]) 12/27/2018  . QT prolongation 06/15/2018  . 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 GI bleed 04/29/2014  . Acute kidney injury superimposed on CKD ([place]) 04/29/2014  . TIA (transient ischemic attack) 07/08/2012  . Diverticulitis 07/08/2012  . Hypertension 07/08/2012    Past Medical History:  Past Medical History:  Diagnosis Date  . Arthritis   . CHF (congestive heart failure) (H[facility])    diastolic dysfunction  . Chronic back pain   . COVID-19   . Depression   . Diverticulitis   . Duodenal ulcer 05/2018   Treated with 3  clips/Dr. Henrene Pastor  . Fall off scafolding 2003   prolonged hospitalization- multiple BUE surgery, concussion  . GI bleed 05/2018  . GIB (gastrointestinal bleeding) 2016   Antral ulcer/duodenal bleed per Endoscopy by Dr. Benson Norway  . Hypertension   . PTSD (post-traumatic stress disorder)    after  a Fall   . Renal insufficiency    Decrease in GFR- PCP removed all NSAIDS & aspirin    Past Surgical History:  Past Surgical History:  Procedure Laterality Date  . BIOPSY  06/16/2018   Procedure: BIOPSY;  Surgeon: Irene Shipper, MD;  Location: [facility] ENDOSCOPY;  Service: Endoscopy;;  . ELBOW FRACTURE SURGERY     ORIF, Shoulder manipulation    . ESOPHAGOGASTRODUODENOSCOPY N/A 04/30/2014   Procedure: ESOPHAGOGASTRODUODENOSCOPY (EGD);  Surgeon: Carol Ada, MD;  Location: [facility] ENDOSCOPY;  Service: Endoscopy;  Laterality: N/A;  . ESOPHAGOGASTRODUODENOSCOPY (EGD) WITH PROPOFOL N/A 06/16/2018   Procedure: ESOPHAGOGASTRODUODENOSCOPY (EGD) WITH PROPOFOL;  Surgeon: Irene Shipper, MD;  Location: [facility] ENDOSCOPY;  Service: Endoscopy;  Laterality: N/A;  . HEMOSTASIS CLIP PLACEMENT  06/16/2018   Procedure: HEMOSTASIS CLIP PLACEMENT;  Surgeon: Irene Shipper, MD;  Location: [facility] ENDOSCOPY;  Service: Endoscopy;;  . LUMBAR LAMINECTOMY/DECOMPRESSION MICRODISCECTOMY N/A 02/16/2015   Procedure: Thoracic ten - thoracic twelve laminectomy;  Surgeon: Earnie Larsson, MD;  Location: [place] NEURO ORS;  Service: Neurosurgery;  Laterality: N/A;  . LUMBAR LAMINECTOMY/DECOMPRESSION MICRODISCECTOMY N/A 03/22/2016   Procedure: Lumbar One-Two, Lumbar Two-Three, Lumbar Three-Four Laminectomy and Foraminotomy;  Surgeon: Earnie Larsson, MD;  Location: [place];  Service: Neurosurgery;  Laterality: N/A;  . NASAL SEPTUM SURGERY    .  SCLEROTHERAPY  06/16/2018   Procedure: SCLEROTHERAPY;  Surgeon: Irene Shipper, MD;  Location: [facility] ENDOSCOPY;  Service: Endoscopy;;  . VIDEO BRONCHOSCOPY Bilateral 02/18/2019   Procedure: VIDEO BRONCHOSCOPY WITHOUT FLUORO;  Surgeon: Margaretha Seeds, MD;  Location: [place];  Service: Pulmonary;  Laterality: Bilateral;    Assessment & Plan Clinical Impression: Patient is a 69 y.o. year old R handed male with history of chronic back pain (and back surgery x2), concussion -2003, PTSD, CKD stage IIIa, GIB, chronic diastolic CHF, OSA, Covid 19 dx 12/16/18 with admission 12/6- 12/16 treated with Remsesivir, dexamethasone and d/c to home on 2- 4L oxygen, readmissions on 12/28- 1/2 for worsening of DOE and decreased intake with diagnosis of cavitary lesions BUL and sent home on slow steriod wean as well as 3 weeks Augmentin for sinusitis. He was readmitted on 02/16/19 with fever, tachypnea and CTA chest was negative for PE but showed worsening of bilateral ground glass opacities c/w PNA, 18 mm partially cavitary RML nodule and enlarged right paratracheal lymph nodes. He was started on broad spectrum antibiotics and underwent bronchoscopy 1/28 and no lesions seen with friable easily irritated mucosa.  BAL with no growth and negative for AFB on smear. BC X 2 negative. Sputum culture with few pseudomonas aeruginosa.   ID consulted for input. Dr. Linus Salmons felt that patient's progressive hypoxia with SOB likely due to development of new interstitial disease and not bacterial in nature---may need lung biopsy for definite diagnosis  but to treat pseudomonas PNA with cefepime thorough Feb 9th. He has had reports of sore throat treated with nystatin mouthwash.    Dr. 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  recommends repeat CT chest around 2/26 for follow up on RML nodule, slow taper of steroids--60 mg X 4 weeks, 40 mg X 4 weeks then 20 mg X 4 weeks  Also recommends encouraging IS and Bactrim DS MWF for PJP ppx (weekly BMP X month). Patient refusing CPAP--per pulmonary "CPAP at night; if patient wants a break from this I would  D/C night-time oximetry, his O2 will go down with apneic events which would lead to inappropriate uptitration of night-time O2, patient informed of this"  Therapy ongoing and patient noted to be debilitated with SOB with minimal activity. Currently requires 3L at rest and 6 liters with activity.  CIR recommended due to functional decline. Patient transferred to CIR on 02/23/2019 .   Patient currently requires min with mobility secondary to muscle endurance, decreased cardiorespiratoy endurance and decreased oxygen support and decreased standing balance, decreased postural control and decreased balance strategies.  Prior to hospitalization, patient was independent  with mobility and lived with Alone in a House home.  Home access is 7 with one handrail in midlineStairs to enter.  Patient will benefit from skilled PT intervention to maximize safe functional mobility, minimize fall risk and decrease caregiver burden for planned discharge home alone.  Anticipate patient will benefit from follow up OP at discharge.  PT - End of Session Activity Tolerance: Tolerates 30+ min activity with multiple rests Endurance Deficit: Yes Endurance Deficit Description: Requires 6L O2 with activity to maintain SPO2 >90%, requires rest breaks between all activity due of SOB PT Assessment Rehab Potential (ACUTE/IP ONLY): Excellent PT Barriers to Discharge: Home environment access/layout;Lack of/limited family support;New oxygen PT Barriers to Discharge Comments: 7 STE home with 1 rail, lives alone with limited PRN  assist at d/c, required 3-6L O2 that is new since first hospital admission in November PT Patient demonstrates impairments in the following area(s): Balance;Endurance;Motor;Nutrition;Pain;Perception;Safety;Sensory;Skin Integrity PT Transfers Functional Problem(s): Bed Mobility;Bed to Chair;Car;Furniture;Floor PT Locomotion Functional Problem(s): Ambulation;Wheelchair Mobility;Stairs PT Plan PT Intensity: Minimum  of 1-2 x/day ,45 to 90 minutes PT Frequency: 5 out of 7 days PT Duration Estimated Length of Stay: 7-10 days PT Treatment/Interventions: Ambulation/gait training;Discharge planning;Functional mobility training;Psychosocial support;Therapeutic Activities;Balance/vestibular training;Disease management/prevention;Neuromuscular re-education;Skin care/wound management;Therapeutic Exercise;Wheelchair propulsion/positioning;DME/adaptive equipment instruction;Pain management;Splinting/orthotics;UE/LE Strength taining/ROM;Community reintegration;Functional electrical stimulation;Patient/family education;Stair training;UE/LE Coordination activities PT Transfers Anticipated Outcome(s): mod I using LRAD PT Locomotion Anticipated Outcome(s): Mod I using LRAD >150 ft. PT Recommendation Recommendations for Other Services: Neuropsych consult;Therapeutic Recreation consult Therapeutic Recreation Interventions: Stress management Follow Up Recommendations: Outpatient PT Patient destination: Home Equipment Recommended: To be determined Equipment Details: Patient has a RW, rollator, and SPC, will determine DME needs based on patient's progress  Skilled Therapeutic Intervention In addition to the PT evaluation below, the patient performed the following skilled PT interventions: Session 1: Patient sitting EOB on 3L/min O2 upon PT arrival. Patient alert and agreeable to PT session. Patient reported 5-6/10 pain with a sore throat during session, RN made aware. PT provided repositioning, rest breaks, and distraction as pain interventions throughout session. Vitals: BP 125/78, HR 95, SPO2 92% on 3L/min O2 in sitting.   Therapeutic Activity: Bed Mobility: Patient performed rolling R/L and supine to/from sit independently in a flat bed without use of bed rails.  Dressing: Patient doffed a hospital gown with min A for untying the back, donned a shirt independently and donned underwear and pants with supervision sitting EOB  and performing standing with CGA, see below. Patient desaturated to 86% while dressing, recovered to >90% in <1 min with pursed lip breathing. Titrated patient up to 6L for all mobility to maintain O2 saturations above 90%. Transfers: Patient performed sit to/from stand x2 and stand pivot x1 with CGA for safety/balance without and AD. He then performed sit to stand x2 with supervision using a RW. Provided verbal cues for hand placement on RW and reaching back to sit. Patient performed a simulated low SUV height car transfer with CGA without an AD.  SPO2 >90% on 6L O2 with all transfers.  Gait Training:  Patient ambulated 72 feet without an AD with min A without cues. SPO2 82% on 6L/min and HR 116 after. Recovered to 93% and 95 bpm in <1.5 min performing pursed lip breathing in sitting. Ambulated as described below. He ambulated 15 feet without an AD with min A-CGA with cues for increased step length and erect posture. He then ambulated 15 feet using a RW with supervision-CGA with the same cues.  Wheelchair Mobility:  Patient propelled wheelchair ~150 feet with supervision with increased time to figure out turns and due to decreased activity tolerance. SPO2 91% on 6L O2 after, HR 109.   Patient in recliner on 3L/min O2, SPO2 96%, in room at end of session with breaks locked, chari alarm set, and all needs within reach. Provided cues for pursed lip breathing and paced breathing during mobility to manage O2 saturation.   Session 2: Patient in recliner in room upon PT arrival. Patient alert and agreeable to PT session. Patient denied pain during session. Patient was on 3L/min at rest at beginning and end of session with SPO2 >94% and on 6L/min with mobility >90% unless noted below.   Therapeutic Activity: Transfers: Patient performed sit to/from stand x2 using a RW and  x6 without a RW with CGA-close supervision. Provided verbal cues for reaching back to sit to control descent for safety.  Gait  Training:  Patient ambulated 18 and 10 feet using RW with CGA and min A x1 due to minor LOB the the R. Ambulated described below. Provided verbal cues for erect posture, increased step height, and paced breathing. Patient ascended/descended 8-6" steps using B rails with CGA. Performed step-to with intermittent reciprocal gait pattern. SPO2 87% after HR 118, recovered to SPO2 95% and HR 98 in 1 min with cues for pursed lip breathing.   Wheelchair Mobility:  Patient was transported in the w/c with total A throughout session for energy conservation and time management.  Neuromuscular Re-ed: Patient performed the following balance activities: [employer], see details below. Patient demonstrates increased fall risk as noted by score of  22/56 on Berg Balance Scale.  (<36= high risk for falls, close to 100%; 37-45 significant >80%; 46-51 moderate >50%; 52-55 lower >25%)  Patient in recliner in room at end of session with breaks locked, chair alarm set, and all needs within reach.   Instructed pt in results of PT evaluation as detailed below, PT POC, rehab potential, rehab goals, and discharge recommendations. Additionally discussed CIR's policies regarding fall safety and use of chair alarm and/or quick release belt. Pt verbalized understanding and in agreement. Will update pt's family members as they become available.    PT Evaluation Precautions/Restrictions Precautions Precautions: Fall Precaution Comments: monitor O2 ( used 6 liters for activity and 3 at rest) Restrictions Weight Bearing Restrictions: No Home Living/Prior Functioning Home Living Living Arrangements: Alone Available Help at Discharge: Family;Available PRN/intermittently Type of Home: House Home Access: Stairs to enter [employer] of Steps: 7 with one handrail in midline Entrance Stairs-Rails: Right;Left Home Layout: One level Bathroom Shower/Tub: Chiropodist: Standard Bathroom  Accessibility: Yes  Lives With: Alone Prior Function Level of Independence: Independent with basic ADLs;Independent with gait;Independent with homemaking with ambulation;Independent with transfers  Able to Take Stairs?: Reciprically Driving: Yes 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 Vision/Perception  Perception Perception: Within Functional Limits Praxis Praxis: Intact  Cognition Overall Cognitive Status: Within Functional Limits for tasks assessed Arousal/Alertness: Awake/alert Attention: Focused;Sustained Sustained Attention: Appears intact Alternating Attention: Appears intact Memory: Appears intact Awareness: Appears intact Problem Solving: Appears intact Executive Function: Reasoning;Organizing;Decision Making;Sequencing Reasoning: Appears intact Sequencing: Appears intact Organizing: Appears intact Decision Making: Appears intact Safety/Judgment: Appears intact Sensation Sensation Light Touch: Impaired Detail Peripheral sensation comments: 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 Motor  Motor Motor: Other (comment) Motor - Skilled Clinical Observations: decreased activity tolerance and balance with functional mobility  Mobility Bed Mobility Bed Mobility: Rolling Right;Rolling Left;Supine to Sit;Sit to Supine Rolling Right: Independent Rolling Left: Independent Supine to Sit:  Independent Sit to Supine: Independent Transfers Transfers: Sit to Stand;Stand to Sit;Stand Pivot Transfers Sit to Stand: Contact Guard/Touching assist Stand to Sit: Contact Guard/Touching assist Stand Pivot Transfers: Contact Guard/Touching assist Transfer (Assistive device): None Locomotion  Gait Ambulation: Yes Gait Assistance: Minimal Assistance - Patient > 75% Gait Distance (Feet): 72 Feet Assistive device: None Gait Gait: Yes Gait Pattern: Step-through pattern;Decreased stride length;Decreased hip/knee flexion - right;Decreased hip/knee flexion - left;Right foot flat;Left foot flat;Narrow base of support;Decreased trunk rotation;Trunk flexed Gait velocity: decreased Stairs / Additional Locomotion Stairs: Yes Stairs Assistance: Contact Guard/Touching assist Stair Management Technique: Two rails Number of Stairs: 8 Height of Stairs: 6 Wheelchair Mobility Wheelchair Mobility: Yes Wheelchair Assistance: Chartered loss adjuster: Both upper extremities Wheelchair Parts Management: Needs assistance Distance: 162f  Trunk/Postural Assessment  Cervical Assessment Cervical Assessment: Within Functional Limits Thoracic Assessment Thoracic Assessment: Exceptions to WFL(rounded shoulders) Lumbar Assessment Lumbar Assessment: Exceptions to WFL(previous back sxs- slight lordosis) Postural Control Postural Control: Within Functional Limits  Balance Standardized Balance Assessment Standardized Balance Assessment: Berg Balance Test Berg Balance Test Sit to Stand: Able to stand  independently using hands Standing Unsupported: Able to stand safely 2 minutes Sitting with Back Unsupported but Feet Supported on Floor or Stool: Able to sit safely and securely 2 minutes Stand to Sit: Controls descent by using hands Transfers: Able to transfer safely, definite need of hands Standing Unsupported with Eyes Closed: Needs help to keep from falling Standing Ubsupported  with Feet Together: Needs help to attain position but able to stand for 30 seconds with feet together From Standing, Reach Forward with Outstretched Arm: Reaches forward but needs supervision From Standing Position, Pick up Object from Floor: Unable to try/needs assist to keep balance From Standing Position, Turn to Look Behind Over each Shoulder: Needs assist to keep from losing balance and falling Turn 360 Degrees: Needs close supervision or verbal cueing Standing Unsupported, Alternately Place Feet on Step/Stool: Needs assistance to keep from falling or unable to try Standing Unsupported, One Foot in Front: Needs help to step but can hold 15 seconds Standing on One Leg: Tries to lift leg/unable to hold 3 seconds but remains standing independently Total Score: 22 Dynamic Sitting Balance Dynamic Sitting - Balance Support: No upper extremity supported;Feet supported;During functional activity Dynamic Sitting - Level of Assistance: 7: Independent Dynamic Sitting - Balance Activities: Lateral lean/weight shifting;Forward lean/weight shifting;Reaching for objects Static Standing Balance Static Standing - Balance Support: No upper extremity supported;During functional activity Static Standing - Level of Assistance: 4: Min assist Dynamic Standing Balance Dynamic Standing - Balance Support: No upper extremity supported;During functional activity Dynamic Standing - Level of Assistance: 4: Min assist Dynamic Standing - Balance Activities: Lateral lean/weight shifting;Forward lean/weight shifting;Reaching for objects Extremity Assessment  RUE Assessment RUE Assessment: Exceptions to [facility] St[facility] Comments: old injury- shoulder 0-100, limited full elbow extension- able to perform ADL tasks without difficulty- has learn to accomodate. 0-~60 degrees LUE Assessment LUE Assessment: Within Functional Limits RLE Assessment RLE Assessment: Within Functional Limits Active Range of Motion (AROM)  Comments: WFL for all functional mobility General Strength Comments: Grossly in sitting: 5/5 throughout LLE Assessment LLE Assessment: Within Functional Limits Active Range of Motion (AROM) Comments: WFL for all functional mobility General Strength Comments: Grossly in sitting: 5/5 throughout    Refer to Care Plan for Long Term Goals  Recommendations for other services: Neuropsych and Therapeutic Recreation  Stress management  Discharge Criteria: Patient will be discharged from PT if patient refuses treatment 3 consecutive times without medical reason, if treatment goals not met, if there is a change in medical status, if patient makes no progress towards goals or if patient is discharged from hospital.  The above assessment, treatment plan, treatment  alternatives and goals were discussed and mutually agreed upon: by patient  Doreene Burke PT, DPT  02/24/2019, 3:40 PM

## 2019-02-24 NOTE — Progress Notes (Signed)
Social Work Assessment and Plan   Patient Details  Name: Kurt [name] MRN: [id number] Date of Birth: July 08, 1950  Today's Date: 02/24/2019  Problem List:  Patient Active Problem List   Diagnosis Date Noted  . Diastolic CHF ([place]) 12/87/8676  . Physical debility 02/23/2019  . COVID-19   . Cavitary pneumonia 02/17/2019  . Multifocal pneumonia 02/16/2019  . Diarrhea 02/16/2019  . Respiratory failure, acute ([place]) 02/16/2019  . Pulmonary nodule   . OSA (obstructive sleep apnea) 01/20/2019  . Acute renal failure superimposed on stage 3a chronic kidney disease ([place]) 01/20/2019  . Chronic sinusitis 01/20/2019  . Acute on chronic respiratory failure ([place]) 01/18/2019  . Failure to thrive in adult 01/18/2019  . Major depression, chronic 01/18/2019  . Obesity, Class III, BMI 40-49.9 (morbid obesity) ([place]) 01/18/2019  . History of COVID-19 12/27/2018  . Acute respiratory failure with hypoxia ([place]) 12/27/2018  . CKD (chronic kidney disease) stage 3, GFR 30-59 ml/min 12/27/2018  . CHF (congestive heart failure) ([place]) 12/27/2018  . QT prolongation 06/15/2018  . 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 GI bleed 04/29/2014  . Acute kidney injury superimposed on CKD ([place]) 04/29/2014  . TIA (transient ischemic attack) 07/08/2012  . Diverticulitis 07/08/2012  . Hypertension 07/08/2012   Past Medical History:  Past Medical History:  Diagnosis Date  . Arthritis   . CHF (congestive heart failure) (HCC)    diastolic dysfunction  . Chronic back pain   . COVID-19   . Depression   . Diverticulitis   . Duodenal ulcer 05/2018   Treated with 3 clips/Dr. Henrene Pastor  . Fall off scafolding 2003   prolonged hospitalization- multiple BUE surgery, concussion  . GI bleed 05/2018  . GIB (gastrointestinal bleeding) 2016   Antral ulcer/duodenal bleed per Endoscopy by Dr. Benson Norway  . Hypertension   . PTSD (post-traumatic  stress disorder)    after  a Fall   . Renal insufficiency    Decrease in GFR- PCP removed all NSAIDS & aspirin    Past Surgical History:  Past Surgical History:  Procedure Laterality Date  . BIOPSY  06/16/2018   Procedure: BIOPSY;  Surgeon: Irene Shipper, MD;  Location[facility] ENDOSCOPY;  Service: Endoscopy;;  . ELBOW FRACTURE SURGERY     ORIF, Shoulder manipulation    . ESOPHAGOGASTRODUODENOSCOPY N/A 04/30/2014   Procedure: ESOPHAGOGASTRODUODENOSCOPY (EGD);  Surgeon: Carol Ada, MD;  Location: [facility] ENDOSCOPY;  Service: Endoscopy;  Laterality: N/A;  . ESOPHAGOGASTRODUODENOSCOPY (EGD) WITH PROPOFOL N/A 06/16/2018   Procedure: ESOPHAGOGASTRODUODENOSCOPY (EGD) WITH PROPOFOL;  Surgeon: Irene Shipper, MD;  Location: [facility] ENDOSCOPY;  Service: Endoscopy;  Laterality: N/A;  . HEMOSTASIS CLIP PLACEMENT  06/16/2018   Procedure: HEMOSTASIS CLIP PLACEMENT;  Surgeon: Irene Shipper, MD;  Location: [facility] ENDOSCOPY;  Service: Endoscopy;;  . LUMBAR LAMINECTOMY/DECOMPRESSION MICRODISCECTOMY N/A 02/16/2015   Procedure: Thoracic ten - thoracic twelve laminectomy;  Surgeon: Earnie Larsson, MD;  Location: [place] NEURO ORS;  Service: Neurosurgery;  Laterality: N/A;  . LUMBAR LAMINECTOMY/DECOMPRESSION MICRODISCECTOMY N/A 03/22/2016   Procedure: Lumbar One-Two, Lumbar Two-Three, Lumbar Three-Four Laminectomy and Foraminotomy;  Surgeon: Earnie Larsson, MD;  Location: [place];  Service: Neurosurgery;  Laterality: N/A;  . NASAL SEPTUM SURGERY    . SCLEROTHERAPY  06/16/2018   Procedure: SCLEROTHERAPY;  Surgeon: Irene Shipper, MD;  Location: [facility] ENDOSCOPY;  Service: Endoscopy;;  . VIDEO BRONCHOSCOPY Bilateral 02/18/2019   Procedure: VIDEO BRONCHOSCOPY WITHOUT FLUORO;  Surgeon: Margaretha Seeds,  MD;  Location: [place] ENDOSCOPY;  Service: Pulmonary;  Laterality: Bilateral;   Social History:  reports that he has never smoked. He has never used smokeless tobacco. He reports previous alcohol use. He reports that he does not use drugs.  Family / Support  Systems Marital Status: Widow/Widower How Long?: 4 years; Pt was married for 45 years prior to wife's passing Patient Roles: Parent, Other (Comment)(Employer) Spouse/Significant Other: Deceased Children: 2 adult children (son and daughter) Other Supports: Occassional support from grandchildren Anticipated Caregiver: Pt will be his own primary caregiver. Both of his children work and are unable to provide consistent care. Pt reports that his son will check in on patient in the evening after work. Ability/Limitations of Caregiver: Limited caregiver support Caregiver Availability: Intermittent Family Dynamics: Pt is a widow, and lives alone. See above about support.  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 Legal History/Current Legal Issues: Denies Guardian/Conservator: N/A   Abuse/Neglect Abuse/Neglect Assessment Can Be Completed: Yes Physical Abuse: Denies Verbal Abuse: Denies Sexual Abuse: Denies Exploitation of patient/patient's resources: Denies Self-Neglect: Denies  Emotional Status Pt's affect, behavior and adjustment status: Pt is pleasant. Great self-awareness. Pt is ambitious and would like to "walk out of here." Pt would like to get back his hobbies in which he travels often as a part of a [employer] doing charity work. Recent Psychosocial Issues: Nothing reported. Psychiatric History: Nothing recent. Pt admits to depression in teh past and has been taking medication for depression (Zoloft) since 2003 after falling 61f from a building. He feels he manages his emotions well, and has not  required counseling. Substance Abuse History: Pt reports in his past he has drank alcohol, and quit in 1989 once he realized that it could have the potential to become worse.  Patient / Family Perceptions, Expectations & Goals Pt/Family understanding of illness & functional limitations: Pt understands his defecits, and would like to be independent when he leaves. Premorbid pt/family roles/activities: PTA pt was living independently, relying on home o2. Anticipated changes in roles/activities/participation: No changes. Pt will continue to live alone. Pt/family expectations/goals: Improve endurance, and be able to drive.  Community RDuke EnergyAgencies: None Premorbid Home Care/DME Agencies: None Transportation available at discharge: Pt son to transport pt home.  Discharge Planning Living Arrangements: Alone Support Systems: Children Type of Residence: Private residence Insurance Resources: PMultimedia programmer(specify)(Blue Medicare) Financial Resources: Employment Financial Screen Referred: No Living Expenses: Own Money Management: Patient Does the patient have any problems obtaining your medications?: No Home Management: Pt will continue to manage as he had PTA Patient/Family Preliminary Plans: Plan is for pt to d/c to home with intermittent support [place] Barriers to Discharge: Decreased caregiver support, Lack of/limited family support Sw Barriers to Discharge Comments: Pt will have limited caregiver support Social Work Anticipated Follow [place] Needs: [place]: Discharge to home alone  Clinical Impression SW met with pt in room at bedside to introduce self, explain role, and discuss discharge process. Pt confirms FACESHEET as accurate. D/C plan is home alone with intermittent support from his son who will check in on him in the evenings after work. Pt reported DME: RW,  rollator walker, home oxygen. Unsure on name of home o2 company.   Loralee Pacas, MSW, Social Worker Office (preferred): [phone] Cell: [phone] Fax: [phone] 02/24/2019, 2:45 PM

## 2019-02-25 ENCOUNTER — Inpatient Hospital Stay (HOSPITAL_COMMUNITY): Payer: Medicare Other

## 2019-02-25 ENCOUNTER — Inpatient Hospital Stay (HOSPITAL_COMMUNITY): Payer: Medicare Other | Admitting: Occupational Therapy

## 2019-02-25 MED ORDER — FLUTICASONE PROPIONATE 50 MCG/ACT NA SUSP
2.0000 | Freq: Every day | NASAL | Status: DC
  Administered 2019-02-26 – 2019-03-12 (×16): 2 via NASAL
  Filled 2019-02-25: qty 16

## 2019-02-25 NOTE — Plan of Care (Signed)
  Problem: RH BOWEL ELIMINATION Goal: RH STG MANAGE BOWEL WITH ASSISTANCE Description: STG Manage Bowel with  Min Assistance. Outcome: Progressing Goal: RH STG MANAGE BOWEL W/MEDICATION W/ASSISTANCE Description: STG Manage Bowel with Medication with Min  Assistance. Outcome: Progressing   Problem: RH BLADDER ELIMINATION Goal: RH STG MANAGE BLADDER WITH ASSISTANCE Description: STG Manage Bladder With Min Assistance Outcome: Progressing   Problem: RH SKIN INTEGRITY Goal: RH STG SKIN FREE OF INFECTION/BREAKDOWN Outcome: Progressing Goal: RH STG MAINTAIN SKIN INTEGRITY WITH ASSISTANCE Description: STG Maintain Skin Integrity With Min Assistance. Outcome: Progressing   Problem: RH SAFETY Goal: RH STG ADHERE TO SAFETY PRECAUTIONS W/ASSISTANCE/DEVICE Description: STG Adhere to Safety Precautions With Min  Assistance/Device. Outcome: Progressing Goal: RH STG DECREASED RISK OF FALL WITH ASSISTANCE Description: STG Decreased Risk of Fall With Min Assistance. Outcome: Progressing   Problem: RH PAIN MANAGEMENT Goal: RH STG PAIN MANAGED AT OR BELOW PT'S PAIN GOAL Description: Pt will be free of pain Outcome: Progressing   Problem: RH KNOWLEDGE DEFICIT GENERAL Goal: RH STG INCREASE KNOWLEDGE OF SELF CARE AFTER HOSPITALIZATION Outcome: Progressing   Problem: Consults Goal: RH GENERAL PATIENT EDUCATION Description: See Patient Education module for education specifics. Outcome: Progressing   

## 2019-02-25 NOTE — Progress Notes (Signed)
Kurt [name] PHYSICAL MEDICINE & REHABILITATION PROGRESS NOTE   Subjective/Complaints:  Pt reports had a rough night with CPAP last night- kept beeping or suction not working if turned head or got a drink of water.  Fell asleep around 3am- sore throat about the same- used Neti pot at home and has also used Flonase in past, but not during this hospitalization- will try for sore throat.     ROS; pt denies CP, abd pain, N/V/C/D, Headache, vision changes- does endorse sore throat and intermittent SOB when moving or talking a lot   Objective:   No results found. Recent Labs    02/24/19 0651  WBC 13.3*  HGB 12.1*  HCT 39.6  PLT 542*   Recent Labs    02/23/19 0302 02/24/19 0651  NA 134* 141  K 5.0 4.9  CL 96* 95*  CO2 28 32  GLUCOSE 167* 80  BUN 26* 25*  CREATININE 1.07 1.12  CALCIUM 8.5* 9.3    Intake/Output Summary (Last 24 hours) at 02/25/2019 1610 Last data filed at 02/25/2019 0730 Gross per 24 hour  Intake 600 ml  Output 1375 ml  Net -775 ml     Physical Exam: Vital Signs Blood pressure 136/79, pulse 92, temperature 98 F (36.7 C), resp. rate 20, SpO2 96 %.  Constitutional: He is oriented to person, place, and time. He appears well-developed and well-nourished.  Awake, alert, appropriate, sitting up in bedside chair; wearing O2 by Kurt [name]; no coughing heard today; NAD HENT:  Head: Normocephalic and atraumatic.  Mouth/Throat: Oropharyngeal exudate present.  Back of tongue coated- appears to be thrush, esp with tonsillar area erythematous; localized erythema- looks about the same today Eyes: conjugate gaze Neck: No tracheal deviation present.  Cardiovascular: RRR Respiratory: good air movement overall; but slightly decreased at bases; no W/R/R  GI: Soft, NT, ND, (+BS Musculoskeletal:        Comments: UEs- deltoid 3-/5 B/L; 5-/5 in arms biceps, triceps, WE, grip, finger abd B/L LEs- HF, KE, KF, DF, PF all 5-/5 B/L  Neurological: He is alert and oriented to person,  place, and time. Decreased sensation to light touch in LEs; and LUE to elbow has decreased sensation to light touch.  Intact sensation in RUE.  Skin: Skin is warm and dry.  Bruise on knee  Psychiatric:  Slightly anxious          Assessment/Plan: 1. Functional deficits secondary to debility from 3rd case of pneumonia since COVID 12/20- currently pseudomonas pneumonia which require 3+ hours per day of interdisciplinary therapy in a comprehensive inpatient rehab setting.  Physiatrist is providing close team supervision and 24 hour management of active medical problems listed below.  Physiatrist and rehab team continue to assess barriers to discharge/monitor patient progress toward functional and medical goals  Care Tool:  Bathing  Bathing activity did not occur: Safety/medical concerns(declined this session due to fatigue)           Bathing assist       Upper Body Dressing/Undressing Upper body dressing   What is the patient wearing?: Pull over shirt    Upper body assist Assist Level: Independent    Lower Body Dressing/Undressing Lower body dressing      What is the patient wearing?: Pants     Lower body assist Assist for lower body dressing: Supervision/Verbal cueing     Toileting Toileting    Toileting assist Assist for toileting: Set up assist Assistive Device Comment: (Pt uses urinal)   Transfers Chair/bed transfer  Transfers assist     Chair/bed transfer assist level: Contact Guard/Touching assist     Locomotion Ambulation   Ambulation assist      Assist level: Minimal Assistance - Patient > 75% Assistive device: No Device Max distance: 72'   Walk 10 feet activity   Assist     Assist level: Minimal Assistance - Patient > 75% Assistive device: No Device   Walk 50 feet activity   Assist    Assist level: Minimal Assistance - Patient > 75% Assistive device: No Device    Walk 150 feet activity   Assist Walk 150 feet  activity did not occur: Safety/medical concerns(decreased activity tolerance)         Walk 10 feet on uneven surface  activity   Assist Walk 10 feet on uneven surfaces activity did not occur: Safety/medical concerns(decreased activity tolerance/balance)         Wheelchair     Assist   Type of Wheelchair: Manual    Wheelchair assist level: Supervision/Verbal cueing Max wheelchair distance: 150'    Wheelchair 50 feet with 2 turns activity    Assist        Assist Level: Supervision/Verbal cueing   Wheelchair 150 feet activity     Assist      Assist Level: Supervision/Verbal cueing   Blood pressure 136/79, pulse 92, temperature 98 F (36.7 C), resp. rate 20, SpO2 96 %.  1.  Impaired mobility, function, and ADLs secondary to debility from  Pseudomonas pneumonia s/p 3rd recent hospitalization for pneumonia- initial hospital stay for COVID pneumonia.              -patient may shower             -ELOS/Goals: 2- 2.5 weeks; goals mod I 2.  Antithrombotics: -DVT/anticoagulation:  Pharmaceutical: Lovenox             -antiplatelet therapy: N/a 3. Chronic back pain/Pain Management: Hydrocodone prn  4. H/o depression/Mood: team to provide ego support and help with anxiety.              -antipsychotic agents: N/A 5. Neuropsych: This patient is capable of making decisions on his own behalf. 6. Skin/Wound Care: routine pressure relief measures.  7. Fluids/Electrolytes/Nutrition: Monitor I/O. Check lytes in am.   2/3- lytes and Cr/OK 8. 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 Bactrim DS MWF for PJP prophylaxis. Duonebs tid  2/3- Duonebs made prn - encouraged pt to ask for them when coughing or SOB. 10. OSA: Encourage CPAP use at nights.  11. Pharyngeal mucositis: MMW Qid (was TID) and Nystatin mouth wash qid and trial of nebulized lidocaine added on  2/1. Will also  add Diflucan 200 mg x 1 and 100 daily x 6 days.  2/4- added Flonase for sore throat   12. CKD Stage IIIa: Monitor BMET with serial checks.   2/3- Cr 1.12 and BUN slightly elevated at 25- a little dry- will encourage fluid intake.  13. H/o recurrent GIB/chronic gastritis 05/2018: PPI recommended indefinitely-- will resume with recent use of NSAIDs as well as steroids on board.  14. Chronic diastolic CHF- will monitor daily weights and treat as required.   2/3- will order daily weights  2/4- no weight today  15. Leukocytosis  2/3- on Prednisone 60 mg daily currently- could be that, however of note, last WBC was 8.8- so unclear- afebrile- will con't to  monitor closely for signs of additional infection.   2/4- will recheck labs in AM  LOS: 2 days A FACE TO FACE EVALUATION WAS PERFORMED  Branston Halsted 02/25/2019, 9:23 AM     PHYSICAL MEDICINE & REHABILITATION PROGRESS NOTE   Subjective/Complaints:  Pt reports sore throat a little better but admits it's usually worse at night.  Also frustrated about coughing fits- wants ot know how to treat those- explained sounds like he's wheezing when he's coughing, so has Albuterol nebs for SOB/wheezing and suggest he actually ask for them when coughing a lot or SOB. He didn't appear to understand could ask for those meds.     ROS; pt denies CP, abd pain, N/V/C/D, Headache, vision changes- does endorse sore throat and intermittent SOB when moving or talking a lot   Objective:   No results found. Recent Labs    02/24/19 0651  WBC 13.3*  HGB 12.1*  HCT 39.6  PLT 542*   Recent Labs    02/23/19 0302 02/24/19 0651  NA 134* 141  K 5.0 4.9  CL 96* 95*  CO2 28 32  GLUCOSE 167* 80  BUN 26* 25*  CREATININE 1.07 1.12  CALCIUM 8.5* 9.3    Intake/Output Summary (Last 24 hours) at 02/25/2019 7412 Last data filed at 02/25/2019 0730 Gross per 24 hour  Intake 600 ml  Output 1375 ml  Net -775 ml     Physical Exam: Vital Signs Blood  pressure 136/79, pulse 92, temperature 98 F (36.7 C), resp. rate 20, SpO2 96 %.  Constitutional: He is oriented to person, place, and time. He appears well-developed and well-nourished.  Awake, alert, appropriate, nursing in room at bedside; sitting up in bed; intermittent heavy coughing which sounded wheezy, NAD HENT:  Head: Normocephalic and atraumatic.  Mouth/Throat: Oropharyngeal exudate present.  Back of tongue coated- appears to be thrush, esp with tonsillar area erythematous; localized erythema. Eyes: conjugate gaze Neck: No tracheal deviation present.  Cardiovascular: RRR Respiratory: good air movement overall; but slightly decreased at bases; wheezing WHEN coughing, but not otherwise;  GI: Soft, NT, ND, (+BS Musculoskeletal:        Comments: UEs- deltoid 3-/5 B/L; 5-/5 in arms biceps, triceps, WE, grip, finger abd B/L LEs- HF, KE, KF, DF, PF all 5-/5 B/L  Neurological: He is alert and oriented to person, place, and time. Decreased sensation to light touch in LEs; and LUE to elbow has decreased sensation to light touch.  Intact sensation in RUE.  Skin: Skin is warm and dry.  Bruise on knee  Psychiatric:  Slightly anxious          Assessment/Plan: 1. Functional deficits secondary to debility from 3rd case of pneumonia since COVID 12/20- currently pseudomonas pneumonia which require 3+ hours per day of interdisciplinary therapy in a comprehensive inpatient rehab setting.  Physiatrist is providing close team supervision and 24 hour management of active medical problems listed below.  Physiatrist and rehab team continue to assess barriers to discharge/monitor patient progress toward functional and medical goals  Care Tool:  Bathing  Bathing activity did not occur: Safety/medical concerns(declined this session due to fatigue)           Bathing assist       Upper Body Dressing/Undressing Upper body dressing   What is the patient wearing?: Pull over shirt     Upper body assist Assist Level: Independent    Lower Body Dressing/Undressing Lower body dressing      What is the patient  wearing?: Pants     Lower body assist Assist for lower body dressing: Supervision/Verbal cueing     Toileting Toileting    Toileting assist Assist for toileting: Set up assist Assistive Device Comment: (Pt uses urinal)   Transfers Chair/bed transfer  Transfers assist     Chair/bed transfer assist level: Contact Guard/Touching assist     Locomotion Ambulation   Ambulation assist      Assist level: Minimal Assistance - Patient > 75% Assistive device: No Device Max distance: 72'   Walk 10 feet activity   Assist     Assist level: Minimal Assistance - Patient > 75% Assistive device: No Device   Walk 50 feet activity   Assist    Assist level: Minimal Assistance - Patient > 75% Assistive device: No Device    Walk 150 feet activity   Assist Walk 150 feet activity did not occur: Safety/medical concerns(decreased activity tolerance)         Walk 10 feet on uneven surface  activity   Assist Walk 10 feet on uneven surfaces activity did not occur: Safety/medical concerns(decreased activity tolerance/balance)         Wheelchair     Assist   Type of Wheelchair: Manual    Wheelchair assist level: Supervision/Verbal cueing Max wheelchair distance: 150'    Wheelchair 50 feet with 2 turns activity    Assist        Assist Level: Supervision/Verbal cueing   Wheelchair 150 feet activity     Assist      Assist Level: Supervision/Verbal cueing   Blood pressure 136/79, pulse 92, temperature 98 F (36.7 C), resp. rate 20, SpO2 96 %.  1.  Impaired mobility, function, and ADLs secondary to debility from  Pseudomonas pneumonia s/p 3rd recent hospitalization for pneumonia- initial hospital stay for COVID pneumonia.              -patient may shower             -ELOS/Goals: 2- 2.5 weeks; goals mod I 2.   Antithrombotics: -DVT/anticoagulation:  Pharmaceutical: Lovenox             -antiplatelet therapy: N/a 3. Chronic back pain/Pain Management: Hydrocodone prn  4. H/o depression/Mood: team to provide ego support and help with anxiety.              -antipsychotic agents: N/A 5. Neuropsych: This patient is capable of making decisions on his own behalf. 6. Skin/Wound Care: routine pressure relief measures.  7. Fluids/Electrolytes/Nutrition: Monitor I/O. Check lytes in am.   2/3- lytes and Cr/OK 8. 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 Bactrim DS MWF for PJP prophylaxis. Duonebs tid  2/3- Duonebs made prn - encouraged pt to ask for them when coughing or SOB. 10. OSA: Encourage CPAP use at nights.  11. Pharyngeal mucositis: MMW Qid (was TID) and Nystatin mouth wash qid and trial of nebulized lidocaine added on  2/1. Will also add Diflucan 200 mg x 1 and 100 daily x 6 days.   12. CKD Stage IIIa: Monitor BMET with serial checks.   2/3- Cr 1.12 and BUN slightly elevated at 25- a little dry- will encourage fluid intake.  13. H/o recurrent GIB/chronic gastritis 05/2018: PPI recommended indefinitely-- will resume with recent use of NSAIDs as well as steroids on board.  14. Chronic diastolic CHF- will monitor daily weights and treat as required.  2/3- will order daily weights  15. Leukocytosis  2/3- on Prednisone 60 mg daily currently- could be that, however of note, last WBC was 8.8- so unclear- afebrile- will con't to monitor closely for signs of additional infection.     LOS: 2 days A FACE TO FACE EVALUATION WAS PERFORMED  Kurt [name] 02/25/2019, 9:23 AM

## 2019-02-25 NOTE — Progress Notes (Signed)
Pt does not want to be placed on cpap tonight. No lidocaine treatment given at this time. RT will continue to monitor as needed.

## 2019-02-25 NOTE — Care Management (Signed)
[facility] Individual Statement of Services  Patient Name:  LINARD DAFT  Date:  02/25/2019  Welcome [facility] the [facility].  Our goal is to provide you with an individualized program based on your diagnosis and situation, designed to meet your specific needs.  With this comprehensive rehabilitation program, you will be expected to participate in at least 3 hours of rehabilitation therapies Monday-Friday, with modified therapy programming on the weekends.  Your rehabilitation program will include the following services:  Physical Therapy (PT), Occupational Therapy (OT), Speech Therapy (ST), 24 hour per day rehabilitation nursing, Neuropsychology, Case Management (Social Worker), Rehabilitation Medicine, Nutrition Services and Pharmacy Services  Weekly team conferences will be held on Tuesdays to discuss your progress.  Your Social Worker will talk with you frequently to get your input and to update you on team discussions.  Team conferences with you and your family in attendance may also be held.  Expected length of stay: 7-10 days    Overall anticipated outcome: Independent with Assistive device  Depending on your progress and recovery, your program may change. Your Social Worker will coordinate services and will keep you informed of any changes. Your Social Worker's name and contact numbers are listed  below.  The following services may also be recommended but are not provided by the [facility]:   Driving Evaluations  Home Health Rehabiltiation Services  Outpatient Rehabilitation Services  Vocational Rehabilitation   Arrangements will be made to provide these services after discharge if needed.  Arrangements include referral to agencies that provide these services.  Your insurance has been verified to be: [employer]  Your primary doctor is:  Windle Guard  Pertinent information will be shared with your doctor and your  insurance company.  Social Worker:  Cecile Sheerer, MSW  Information discussed with and copy given to patient by: Gretchen Short, 02/25/2019, 10:14 AM

## 2019-02-25 NOTE — Progress Notes (Signed)
Occupational Therapy Session Note  Patient Details  Name: Kurt [name] MRN: [id number] Date of Birth: 01/25/1950  Today's Date: 02/25/2019 OT Individual Time: 9798-9211   And 1455-1538 OT Individual Time Calculation (min): 51 min and 43 min   Short Term Goals: Week 1:  OT Short Term Goal 1 (Week 1): LTG=STG  Skilled Therapeutic Interventions/Progress Updates:    am session:  Patient finishing in bathroom upon arrival, completed toileting with DS.  On 3L O2 via [place]  Ambulation with rollator with CS to recliner, aware of needs, pleasant and cooperative.  Reviewed posture and postioning when seated and in stance to optimize deep breathing.  Practiced strategies for maintianing pressure when moving from sit to stand with good results.  Reviewed modified borg scale with patient - he will require ongoing education/reinforcement.  Completed seated and standing core and trunk mobility activities, completed various standing reach, LB ROM and stretching activities with fair tolerance and rest breaks between activities - O2 saturation returns to 90's within one minute dropping to the mid 80's at the lowest.  He remained sitting in recliner at close of session on wall O2 with seat alarm set and call bell in reach.    Pm session:   Patient seated in recliner, states that he is tired but eager to continue with therapy program.  On 2L O2 at rest with saturation in the high 90s.   Ambulation without AD CGA in room environment.  Completed UB ergometer in seated position x5 minutes with O2 at 3 L - saturation after activity 92%, repeat ergometer x3 minutes w/ O2 sat 93%.  Sit to stand for standing activity with CS.  Provided w/c cushion for comfort.  Returned to recliner at close of session with CS - returned to wall unit O2 at 2L.. patient with good carryover of breathing techniques and states that he feels that he has learned a lot today.  Call bell and tray table in reach   Therapy Documentation Precautions:   Precautions Precautions: Fall Precaution Comments: monitor O2 ( used 6 liters for activity and 3 at rest) Restrictions Weight Bearing Restrictions: No General:   Vital Signs: Therapy Vitals Temp: 98.7 F (37.1 C) Pulse Rate: 90 Resp: 18 BP: 102/65 Patient Position (if appropriate): Sitting Oxygen Therapy SpO2: 93 % O2 Device: Nasal Cannula O2 Flow Rate (L/min): 3 L/min FiO2 (%): 32 % Pain: Pain Assessment Pain Scale: 0-10 Pain Score: 0-No pain Other Treatments:     Therapy/Group: Individual Therapy  Barrie Lyme 02/25/2019, 4:13 PM

## 2019-02-25 NOTE — Progress Notes (Addendum)
Physical Therapy Session Note  Patient Details  Name: Kurt [name] MRN: [id number] Date of Birth: 1951-01-02  Today's Date: 02/25/2019 PT Individual Time: 1020-1105 and 1300-1400 PT Individual Time Calculation (min): 45 min and 60 min  Short Term Goals: Week 1:  PT Short Term Goal 1 (Week 1): STG=LTG due to short ELOS.  Skilled Therapeutic Interventions/Progress Updates:     Session 1: Patient in recliner in room on 2L/min O2 with O2 sats at 94% upon PT arrival. Patient alert and agreeable to PT session. Patient denied pain during session.   Therapeutic Activity: Transfers: Patient performed sit to/from stand x7 with close supervision using RW. Provided verbal cues for forward weight shift and reaching back to sit. Tends to lean or have LOB posteriorly intermittently, but able to self correct.  He performed the following standing activities for standing and activity tolerance on 2-3L/min: -standing x3 min, O2 sats remained >94% on 2L/min -Standing marching 30 sec, O2 sats dropped to 86% and patient was unable to recover in >1 min, titrated up to 3L/min and patient recovered to >93% in 30 sec -Standing marching 1 min, O2 sats remained >90% on 4L/min, HR 121  Gait Training:  Patient ambulated 55 feet using RW with CGA for safety on 3L/min, O2 sats dropped to 86. Ambulated with decreased gait speed, forward trunk lean, B trendelenburg gait, decreased step height and length, and decreased L foot clearance. Provided verbal cues for erect posture, paced breathing, increased B gluteal activation in stance, and increased step height for safety. O2 sats dropped to 88%, recovered to >93% in <1 min with pursed lip breathing in sitting.   Wheelchair Mobility:  Patient propelled wheelchair 135 feet with supervision using B UEs for UE strengthening and endurance training on 2L/min O2, SPO2 90% after.. Provided verbal cues for turning technique.  Patient in recliner in room on 2L/min O2 with O2 sats  >94% at end of session with breaks locked, chair alarm set, and all needs within reach.   Session 2: Patient in recliner in room on 2L/min O2 with O2 sats >94% upon PT arrival. Patient alert and agreeable to PT session. Patient denied pain during session.  Therapeutic Activity: Transfers: Patient performed sit to/from stand x6 with close supervision with and without RW. Provided verbal cues as above.  Wheelchair Mobility:  Patient propelled wheelchair 140 feet with supervision-mod I for UE strengthening and endurance training on 4L/min O2, SPO2 94% after.  Neuromuscular Re-ed: Patient performed the following seated and standing activities without and AD with multi-modal cues while incorporating diaphragmatic breathing for increased breath support on 4L/min O2, SPO2 >90% throughout: -seated diaphragmatic breathing x2 min, required supervision -standing diaphragmatic breathing x4 min, required supervision -seated trunk flexion/extension with arms behind head times with breathing (in with extension, out with flexion) x1 min, required supervision -standing trunk rotation times with breathing, cued for increased speed for balance challenge, required CGA-min A -standing weight shifts with opposite heel lift x2 min, required CGA-min A -sit to/from stand with minimal UE use 2x5 with focus on forward weight shift and LE motor control -ambulated 40 feet and 20 feet without an AD for increased cardio and balance challenge with min A  Patient in recliner in room at end of session with breaks locked, chair alarm set, and all needs within reach.    Therapy Documentation Precautions:  Precautions Precautions: Fall Precaution Comments: monitor O2 ( used 6 liters for activity and 3 at rest) Restrictions Weight Bearing Restrictions: No  Therapy/Group: Individual Therapy  Corah Willeford L Icey Tello PT, DPT  02/25/2019, 5:29 PM

## 2019-02-26 ENCOUNTER — Inpatient Hospital Stay (HOSPITAL_COMMUNITY): Payer: Medicare Other | Admitting: Physical Therapy

## 2019-02-26 ENCOUNTER — Inpatient Hospital Stay (HOSPITAL_COMMUNITY): Payer: Medicare Other | Admitting: Occupational Therapy

## 2019-02-26 LAB — CBC WITH DIFFERENTIAL/PLATELET
Abs Immature Granulocytes: 0.21 10*3/uL — ABNORMAL HIGH (ref 0.00–0.07)
Basophils Absolute: 0 10*3/uL (ref 0.0–0.1)
Basophils Relative: 0 %
Eosinophils Absolute: 0.1 10*3/uL (ref 0.0–0.5)
Eosinophils Relative: 1 %
HCT: 37.7 % — ABNORMAL LOW (ref 39.0–52.0)
Hemoglobin: 11.6 g/dL — ABNORMAL LOW (ref 13.0–17.0)
Immature Granulocytes: 2 %
Lymphocytes Relative: 11 %
Lymphs Abs: 1.2 10*3/uL (ref 0.7–4.0)
MCH: 27 pg (ref 26.0–34.0)
MCHC: 30.8 g/dL (ref 30.0–36.0)
MCV: 87.9 fL (ref 80.0–100.0)
Monocytes Absolute: 0.9 10*3/uL (ref 0.1–1.0)
Monocytes Relative: 8 %
Neutro Abs: 8.2 10*3/uL — ABNORMAL HIGH (ref 1.7–7.7)
Neutrophils Relative %: 78 %
Platelets: 414 10*3/uL — ABNORMAL HIGH (ref 150–400)
RBC: 4.29 MIL/uL (ref 4.22–5.81)
RDW: 15.4 % (ref 11.5–15.5)
WBC: 10.6 10*3/uL — ABNORMAL HIGH (ref 4.0–10.5)
nRBC: 0 % (ref 0.0–0.2)

## 2019-02-26 LAB — BASIC METABOLIC PANEL
Anion gap: 9 (ref 5–15)
BUN: 28 mg/dL — ABNORMAL HIGH (ref 8–23)
CO2: 30 mmol/L (ref 22–32)
Calcium: 8.9 mg/dL (ref 8.9–10.3)
Chloride: 99 mmol/L (ref 98–111)
Creatinine, Ser: 1.23 mg/dL (ref 0.61–1.24)
GFR calc Af Amer: >60 mL/min (ref >60)
GFR calc non Af Amer: 59 mL/min — ABNORMAL LOW (ref >60)
Glucose, Bld: 90 mg/dL (ref 70–99)
Potassium: 4.2 mmol/L (ref 3.5–5.1)
Sodium: 138 mmol/L (ref 135–145)

## 2019-02-26 MED ORDER — NON FORMULARY: 5.0000 mg | Freq: Every day | Status: DC

## 2019-02-26 MED ORDER — MELATONIN 3 MG PO TABS
6.0000 mg | ORAL_TABLET | Freq: Every day | ORAL | Status: DC
  Administered 2019-02-26 – 2019-03-11 (×14): 6 mg via ORAL
  Filled 2019-02-26 (×15): qty 2

## 2019-02-26 NOTE — Progress Notes (Signed)
[name] PHYSICAL MEDICINE & REHABILITATION PROGRESS NOTE   Subjective/Complaints:   Said didn't get Flonase for some reason.   Sats once walked 83-87%- on 2L.  Got trazodone- low dose didn't help.   Still having sore throat.   ROS; pt denies CP, abd pain, N/V/C/D, Headache, vision changes- does endorse sore throat and intermittent SOB when moving or talking a lot   Objective:   No results found. Recent Labs    02/24/19 0651 02/26/19 0620  WBC 13.3* 10.6*  HGB 12.1* 11.6*  HCT 39.6 37.7*  PLT 542* 414*   Recent Labs    02/24/19 0651 02/26/19 0620  NA 141 138  K 4.9 4.2  CL 95* 99  CO2 32 30  GLUCOSE 80 90  BUN 25* 28*  CREATININE 1.12 1.23  CALCIUM 9.3 8.9    Intake/Output Summary (Last 24 hours) at 02/26/2019 0859 Last data filed at 02/26/2019 0711 Gross per 24 hour  Intake 680 ml  Output 1850 ml  Net -1170 ml     Physical Exam: Vital Signs Blood pressure (!) 147/78, pulse 74, temperature 98.5 F (36.9 C), resp. rate 18, SpO2 96 %.   Awake, alert, appropriate, walking with PT on 2L O2- sats 83-87%- after 2 minutes, went up to 91%. When walking, feet almost crossing; NAD HENT:  Head: Normocephalic and atraumatic.  Mouth/Throat: Oropharyngeal exudate present.  Back of tongue coated- appears to be thrush, esp with tonsillar area erythematous; localized erythema-looks about the same today compared to last 2 days Eyes: conjugate gaze Neck: No tracheal deviation present.  Cardiovascular: RRR Respiratory: good air movement overall; but slightly decreased at bases; no W/R/R - a few accessory muscles used initially after walking GI: Soft, NT, ND, (+BS Musculoskeletal:        Comments: UEs- deltoid 3-/5 B/L; 5-/5 in arms biceps, triceps, WE, grip, finger abd B/L LEs- HF, KE, KF, DF, PF all 5-/5 B/L  Neurological: He is alert and oriented to person, place, and time. Decreased sensation to light touch in LEs; and LUE to elbow has decreased sensation to  light touch.  Intact sensation in RUE.  Skin: Skin is warm and dry.  Bruise on knee  Psychiatric:  Less anxious         Assessment/Plan: 1. Functional deficits secondary to debility from 3rd case of pneumonia since COVID 12/20- currently pseudomonas pneumonia which require 3+ hours per day of interdisciplinary therapy in a comprehensive inpatient rehab setting.  Physiatrist is providing close team supervision and 24 hour management of active medical problems listed below.  Physiatrist and rehab team continue to assess barriers to discharge/monitor patient progress toward functional and medical goals  Care Tool:  Bathing  Bathing activity did not occur: Safety/medical concerns(declined this session due to fatigue)           Bathing assist       Upper Body Dressing/Undressing Upper body dressing   What is the patient wearing?: Pull over shirt    Upper body assist Assist Level: Independent    Lower Body Dressing/Undressing Lower body dressing      What is the patient wearing?: Pants     Lower body assist Assist for lower body dressing: Supervision/Verbal cueing     Toileting Toileting    Toileting assist Assist for toileting: Set up assist Assistive Device Comment: urinal   Transfers Chair/bed transfer  Transfers assist     Chair/bed transfer assist level: Contact Guard/Touching assist     Locomotion Ambulation  Ambulation assist      Assist level: Minimal Assistance - Patient > 75% Assistive device: No Device Max distance: 72'   Walk 10 feet activity   Assist     Assist level: Minimal Assistance - Patient > 75% Assistive device: No Device   Walk 50 feet activity   Assist    Assist level: Minimal Assistance - Patient > 75% Assistive device: No Device    Walk 150 feet activity   Assist Walk 150 feet activity did not occur: Safety/medical concerns(decreased activity tolerance)         Walk 10 feet on uneven surface   activity   Assist Walk 10 feet on uneven surfaces activity did not occur: Safety/medical concerns(decreased activity tolerance/balance)         Wheelchair     Assist   Type of Wheelchair: Manual    Wheelchair assist level: Supervision/Verbal cueing Max wheelchair distance: 150'    Wheelchair 50 feet with 2 turns activity    Assist        Assist Level: Supervision/Verbal cueing   Wheelchair 150 feet activity     Assist      Assist Level: Supervision/Verbal cueing   Blood pressure (!) 147/78, pulse 74, temperature 98.5 F (36.9 C), resp. rate 18, SpO2 96 %.  1.  Impaired mobility, function, and ADLs secondary to debility from  Pseudomonas pneumonia s/p 3rd recent hospitalization for pneumonia- initial hospital stay for COVID pneumonia.              -patient may shower             -ELOS/Goals: 2- 2.5 weeks; goals mod I 2.  Antithrombotics: -DVT/anticoagulation:  Pharmaceutical: Lovenox             -antiplatelet therapy: N/a 3. Chronic back pain/Pain Management: Hydrocodone prn  4. H/o depression/Mood: team to provide ego support and help with anxiety.              -antipsychotic agents: N/A 5. Neuropsych: This patient is capable of making decisions on his own behalf. 6. Skin/Wound Care: routine pressure relief measures.  7. Fluids/Electrolytes/Nutrition: Monitor I/O. Check lytes in am.   2/3- lytes and Cr/OK 8. 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 Bactrim DS MWF for PJP prophylaxis. Duonebs tid  2/3- Duonebs made prn - encouraged pt to ask for them when coughing or SOB. 10. OSA: Encourage CPAP use at nights.  11. Pharyngeal mucositis: MMW Qid (was TID) and Nystatin mouth wash qid and trial of nebulized lidocaine added on  2/1. Will also add Diflucan 200 mg x 1 and 100 daily x 6 days.  2/4- added Flonase for sore throat   12. CKD Stage IIIa: Monitor  BMET with serial checks.   2/3- Cr 1.12 and BUN slightly elevated at 25- a little dry- will encourage fluid intake.  13. H/o recurrent GIB/chronic gastritis 05/2018: PPI recommended indefinitely-- will resume with recent use of NSAIDs as well as steroids on board.  14. Chronic diastolic CHF- will monitor daily weights and treat as required.   2/3- will order daily weights  2/5- no weight t  15. Leukocytosis  2/3- on Prednisone 60 mg daily currently- could be that, however of note, last WBC was 8.8- so unclear- afebrile- will con't to monitor closely for signs of additional infection.   2/4- will recheck labs in AM  2/5- WBC down to  10.6 16. Prolonged QT interval  2/5- cannot prescribe Trazodone- will try Melatonin  LOS: 3 days A FACE TO FACE EVALUATION WAS PERFORMED  Kurt [name] 02/26/2019, 8:59 AM    [place] PHYSICAL MEDICINE & REHABILITATION PROGRESS NOTE   Subjective/Complaints:  Pt reports sore throat a little better but admits it's usually worse at night.  Also frustrated about coughing fits- wants ot know how to treat those- explained sounds like he's wheezing when he's coughing, so has Albuterol nebs for SOB/wheezing and suggest he actually ask for them when coughing a lot or SOB. He didn't appear to understand could ask for those meds.     ROS; pt denies CP, abd pain, N/V/C/D, Headache, vision changes- does endorse sore throat and intermittent SOB when moving or talking a lot   Objective:   No results found. Recent Labs    02/24/19 0651 02/26/19 0620  WBC 13.3* 10.6*  HGB 12.1* 11.6*  HCT 39.6 37.7*  PLT 542* 414*   Recent Labs    02/24/19 0651 02/26/19 0620  NA 141 138  K 4.9 4.2  CL 95* 99  CO2 32 30  GLUCOSE 80 90  BUN 25* 28*  CREATININE 1.12 1.23  CALCIUM 9.3 8.9    Intake/Output Summary (Last 24 hours) at 02/26/2019 0859 Last data filed at 02/26/2019 0711 Gross per 24 hour  Intake 680 ml  Output 1850 ml  Net -1170 ml     Physical  Exam: Vital Signs Blood pressure (!) 147/78, pulse 74, temperature 98.5 F (36.9 C), resp. rate 18, SpO2 96 %.  Constitutional: He is oriented to person, place, and time. He appears well-developed and well-nourished.  Awake, alert, appropriate, nursing in room at bedside; sitting up in bed; intermittent heavy coughing which sounded wheezy, NAD HENT:  Head: Normocephalic and atraumatic.  Mouth/Throat: Oropharyngeal exudate present.  Back of tongue coated- appears to be thrush, esp with tonsillar area erythematous; localized erythema. Eyes: conjugate gaze Neck: No tracheal deviation present.  Cardiovascular: RRR Respiratory: good air movement overall; but slightly decreased at bases; wheezing WHEN coughing, but not otherwise;  GI: Soft, NT, ND, (+BS Musculoskeletal:        Comments: UEs- deltoid 3-/5 B/L; 5-/5 in arms biceps, triceps, WE, grip, finger abd B/L LEs- HF, KE, KF, DF, PF all 5-/5 B/L  Neurological: He is alert and oriented to person, place, and time. Decreased sensation to light touch in LEs; and LUE to elbow has decreased sensation to light touch.  Intact sensation in RUE.  Skin: Skin is warm and dry.  Bruise on knee  Psychiatric:  Slightly anxious          Assessment/Plan: 1. Functional deficits secondary to debility from 3rd case of pneumonia since COVID 12/20- currently pseudomonas pneumonia which require 3+ hours per day of interdisciplinary therapy in a comprehensive inpatient rehab setting.  Physiatrist is providing close team supervision and 24 hour management of active medical problems listed below.  Physiatrist and rehab team continue to assess barriers to discharge/monitor patient progress toward functional and medical goals  Care Tool:  Bathing  Bathing activity did not occur: Safety/medical concerns(declined this session due to fatigue)           Bathing assist       Upper Body Dressing/Undressing Upper body dressing   What is the patient  wearing?: Pull over shirt    Upper body assist Assist Level: Independent    Lower Body Dressing/Undressing Lower body dressing  What is the patient wearing?: Pants     Lower body assist Assist for lower body dressing: Supervision/Verbal cueing     Toileting Toileting    Toileting assist Assist for toileting: Set up assist Assistive Device Comment: urinal   Transfers Chair/bed transfer  Transfers assist     Chair/bed transfer assist level: Contact Guard/Touching assist     Locomotion Ambulation   Ambulation assist      Assist level: Minimal Assistance - Patient > 75% Assistive device: No Device Max distance: 72'   Walk 10 feet activity   Assist     Assist level: Minimal Assistance - Patient > 75% Assistive device: No Device   Walk 50 feet activity   Assist    Assist level: Minimal Assistance - Patient > 75% Assistive device: No Device    Walk 150 feet activity   Assist Walk 150 feet activity did not occur: Safety/medical concerns(decreased activity tolerance)         Walk 10 feet on uneven surface  activity   Assist Walk 10 feet on uneven surfaces activity did not occur: Safety/medical concerns(decreased activity tolerance/balance)         Wheelchair     Assist   Type of Wheelchair: Manual    Wheelchair assist level: Supervision/Verbal cueing Max wheelchair distance: 150'    Wheelchair 50 feet with 2 turns activity    Assist        Assist Level: Supervision/Verbal cueing   Wheelchair 150 feet activity     Assist      Assist Level: Supervision/Verbal cueing   Blood pressure (!) 147/78, pulse 74, temperature 98.5 F (36.9 C), resp. rate 18, SpO2 96 %.  1.  Impaired mobility, function, and ADLs secondary to debility from  Pseudomonas pneumonia s/p 3rd recent hospitalization for pneumonia- initial hospital stay for COVID pneumonia.              -patient may shower             -ELOS/Goals: 2- 2.5  weeks; goals mod I 2.  Antithrombotics: -DVT/anticoagulation:  Pharmaceutical: Lovenox             -antiplatelet therapy: N/a 3. Chronic back pain/Pain Management: Hydrocodone prn  4. H/o depression/Mood: team to provide ego support and help with anxiety.              -antipsychotic agents: N/A 5. Neuropsych: This patient is capable of making decisions on his own behalf. 6. Skin/Wound Care: routine pressure relief measures.  7. Fluids/Electrolytes/Nutrition: Monitor I/O. Check lytes in am.   2/3- lytes and Cr/OK 8. 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 Bactrim DS MWF for PJP prophylaxis. Duonebs tid  2/3- Duonebs made prn - encouraged pt to ask for them when coughing or SOB. 10. OSA: Encourage CPAP use at nights.  11. Pharyngeal mucositis: MMW Qid (was TID) and Nystatin mouth wash qid and trial of nebulized lidocaine added on  2/1. Will also add Diflucan 200 mg x 1 and 100 daily x 6 days.   12. CKD Stage IIIa: Monitor BMET with serial checks.   2/3- Cr 1.12 and BUN slightly elevated at 25- a little dry- will encourage fluid intake.  13. H/o recurrent GIB/chronic gastritis 05/2018: PPI recommended indefinitely-- will resume with recent use of NSAIDs as well as steroids on board.  14. Chronic diastolic CHF- will monitor daily weights and treat as  required.   2/3- will order daily weights  15. Leukocytosis  2/3- on Prednisone 60 mg daily currently- could be that, however of note, last WBC was 8.8- so unclear- afebrile- will con't to monitor closely for signs of additional infection.     LOS: 3 days A FACE TO FACE EVALUATION WAS PERFORMED  Kurt [name] 02/26/2019, 8:59 AM

## 2019-02-26 NOTE — IPOC Note (Signed)
Overall Plan of Care [facility]) Patient Details Name: Kurt [name] MRN: [id number] DOB: 1950/04/26  Admitting Diagnosis: Physical debility  Hospital Problems: Principal Problem:   Physical debility Active Problems:   GI bleed   Acute respiratory failure with hypoxia (HCC)   CKD (chronic kidney disease) stage 3, GFR 30-59 ml/min   Acute on chronic respiratory failure (HCC)   Obesity, Class III, BMI 40-49.9 (morbid obesity) (HCC)   OSA (obstructive sleep apnea)   Acute renal failure superimposed on stage 3a chronic kidney disease (HCC)   Multifocal pneumonia   Diastolic CHF ([place])     Functional Problem List: Nursing Pain, Edema  PT Balance, Endurance, Motor, Nutrition, Pain, Perception, Safety, Sensory, Skin Integrity  OT Balance, Endurance  SLP    TR         Basic ADL's: OT Bathing, Dressing, Toileting     Advanced  ADL's: OT Simple Meal Preparation, Laundry, Light Housekeeping     Transfers: PT Bed Mobility, Bed to Chair, Car, [employer], Floor  OT Toilet     Locomotion: PT Ambulation, Emergency planning/management officer, Stairs     Additional Impairments: OT None  SLP        TR      Anticipated Outcomes Item Anticipated Outcome  Self Feeding n/a  Swallowing      Basic self-care  mod I  Toileting  mod I   Bathroom Transfers mod I  Bowel/Bladder  to be continent x 2 LBM 20/02/21 per pt  Transfers  mod I using LRAD  Locomotion  Mod I using LRAD >150 ft.  Communication     Cognition     Pain  less than 2  Safety/Judgment  to remain free from falls   Therapy Plan: PT Intensity: Minimum of 1-2 x/day ,45 to 90 minutes PT Frequency: 5 out of 7 days PT Duration Estimated Length of Stay: 7-10 days OT Intensity: Minimum of 1-2 x/day, 45 to 90 minutes OT Frequency: 5 out of 7 days OT Duration/Estimated Length of Stay: 7-10days     Due to the current state of emergency, patients may not be receiving their 3-hours of Medicare-mandated therapy.   Team  Interventions: Nursing Interventions Patient/Family Education, Disease Management/Prevention, Discharge Planning, Pain Management  PT interventions Ambulation/gait training, Discharge planning, Functional mobility training, Psychosocial support, Therapeutic Activities, Balance/vestibular training, Disease management/prevention, Neuromuscular re-education, Skin care/wound management, Therapeutic Exercise, Wheelchair propulsion/positioning, DME/adaptive equipment instruction, Pain management, Splinting/orthotics, UE/LE Strength taining/ROM, Community reintegration, Technical sales engineer stimulation, Patient/family education, IT trainer, UE/LE Coordination activities  OT Interventions Training and development officer, Self Care/advanced ADL retraining, Therapeutic Exercise, DME/adaptive equipment instruction, UE/LE Strength taining/ROM, Academic librarian, Barrister's clerk education, UE/LE Coordination activities, Functional mobility training, Psychosocial support, Therapeutic Activities  SLP Interventions    TR Interventions    SW/CM Interventions Discharge Planning, Psychosocial Support, Patient/Family Education   Barriers to Discharge MD  Medical stability, Home enviroment access/loayout, Lack of/limited family support, Weight, New oxygen and extrememly sore throat/thrush  Nursing IV antibiotics    PT Home environment access/layout, Lack of/limited family support, New oxygen 7 STE home with 1 rail, lives alone with limited PRN assist at d/c, required 3-6L O2 that is new since first hospital admission in November  OT      SLP      SW Decreased caregiver support, Lack of/limited family support Pt will have limited caregiver support   Team Discharge Planning: Destination: PT-Home ,OT- Home , SLP-  Projected Follow-up: PT-Outpatient PT, OT-  Home health OT, SLP-  Projected Equipment Needs: PT-To  be determined, OT- To be determined, SLP-  Equipment Details: PT-Patient has a RW, rollator, and  SPC, will determine DME needs based on patient's progress, OT-  Patient/family involved in discharge planning: PT- Patient,  OT-Patient, SLP-   MD ELOS: 7-10 days Medical Rehab Prognosis:  Good Assessment: Pt is a 69 yr old male with  Pseudomonas pneumonia s/p 3rd recent hospitalization initially with COVID and interstitial lung disease; pharyngeal mucositis, CKD Stage IIIa, chroic diastolic CHF, and leukocytosis on Steroid taper.  Goals Mod I      See Team Conference Notes for weekly updates to the plan of care

## 2019-02-26 NOTE — Plan of Care (Signed)
  Problem: RH BOWEL ELIMINATION Goal: RH STG MANAGE BOWEL WITH ASSISTANCE Description: STG Manage Bowel with  Min Assistance. Outcome: Progressing Goal: RH STG MANAGE BOWEL W/MEDICATION W/ASSISTANCE Description: STG Manage Bowel with Medication with Min  Assistance. Outcome: Progressing   Problem: RH BLADDER ELIMINATION Goal: RH STG MANAGE BLADDER WITH ASSISTANCE Description: STG Manage Bladder With Min Assistance Outcome: Progressing   Problem: RH SKIN INTEGRITY Goal: RH STG SKIN FREE OF INFECTION/BREAKDOWN Outcome: Progressing Goal: RH STG MAINTAIN SKIN INTEGRITY WITH ASSISTANCE Description: STG Maintain Skin Integrity With Min Assistance. Outcome: Progressing   Problem: RH SAFETY Goal: RH STG ADHERE TO SAFETY PRECAUTIONS W/ASSISTANCE/DEVICE Description: STG Adhere to Safety Precautions With Min  Assistance/Device. Outcome: Progressing Goal: RH STG DECREASED RISK OF FALL WITH ASSISTANCE Description: STG Decreased Risk of Fall With Min Assistance. Outcome: Progressing   Problem: RH PAIN MANAGEMENT Goal: RH STG PAIN MANAGED AT OR BELOW PT'S PAIN GOAL Description: Pt will be free of pain Outcome: Progressing   Problem: RH KNOWLEDGE DEFICIT GENERAL Goal: RH STG INCREASE KNOWLEDGE OF SELF CARE AFTER HOSPITALIZATION Outcome: Progressing   Problem: Consults Goal: RH GENERAL PATIENT EDUCATION Description: See Patient Education module for education specifics. Outcome: Progressing   

## 2019-02-26 NOTE — Progress Notes (Signed)
Occupational Therapy Session Note  Patient Details  Name: Kurt [name] MRN: [id number] Date of Birth: 07/15/1950  Today's Date: 02/26/2019 OT Individual Time: 0758-0900  & 1300-1415 OT Individual Time Calculation (min): 62 min & 75 min   Short Term Goals: Week 1:  OT Short Term Goal 1 (Week 1): LTG=STG  Skilled Therapeutic Interventions/Progress Updates:    am session:   Patient seated in recliner, states that he did not sleep well last night but is happy to participate in therapy session.  2L O2 via [place] at rest (96%)   Increased O2 to 3L for activity (he remained in the high 80s/low90s recovering to mid 90s within one minute with moderately strenuous activity)  Reviewed modified Borg - provided written scale - he continues to require guidance on this scale.   Completed ambulation with RW 90 feet x2 CS/CGA.  Completed alternating standing LB/trunk exercises with seated core/posture activities with good tolerance.  Returned to room using LEs to propel w/c.  Ambulation with RW to recliner with CS where he remained at close of session.  Returned O2 to 2L on wall unit.  Seat alarm set and call bell in reach..  Pm session:   Patient seated in recliner - mod I to use urinal. Short distance ambulation in room without AD with CGA.  2L O2 via [place] at rest, increased to 3L during activity - saturation remained within range.  Completed UB ergometer in stance for 40 sec, ergometer seated x 10 minutes.  Nustep 4 minutes.  Rest breaks and review of home set up and routine between activities.  Completed ambulation with RW to/from toilet with CGA, able to stand to void and wash hands at sink with CS - cues for pace and DBT under stressful (urgent need to void) situations.  Able to propel w/c on unit with UB and LB CS.  Ambulation without AD to recliner with CGA where he remained at close of session.  Returned O2 to 2L on wall unit.  Seat alarm set and call bell in reach..   Therapy Documentation Precautions:   Precautions Precautions: Fall Precaution Comments: monitor O2 ( used 6 liters for activity and 3 at rest) Restrictions Weight Bearing Restrictions: No General:   Vital Signs: Therapy Vitals Temp: 98 F (36.7 C) Pulse Rate: 97 Resp: 16 BP: 120/66 Patient Position (if appropriate): Sitting Oxygen Therapy SpO2: 95 % O2 Device: Nasal Cannula O2 Flow Rate (L/min): 2 L/min Pain: Pain Assessment Pain Scale: 0-10 Pain Score: 0-No pain Other Treatments:     Therapy/Group: Individual Therapy  Barrie Lyme 02/26/2019, 4:02 PM

## 2019-02-26 NOTE — Progress Notes (Signed)
Physical Therapy Session Note  Patient Details  Name: Kurt [name] MRN: [id number] Date of Birth: 1950-10-29  Today's Date: 02/26/2019 PT Individual Time: 1102-1200 PT Individual Time Calculation (min): 58 min   Short Term Goals: Week 1:  PT Short Term Goal 1 (Week 1): STG=LTG due to short ELOS.  Skilled Therapeutic Interventions/Progress Updates: Pt presents sitting in recliner and agreeable to participate in therapy.  Pt negotiated w/c out of room and in hallways to gym using bilateral UE and LEs.  Pt performed multiple sit to stand transfers w/ CGA and verbal cues for hand placement.  Pt performed multiple standing balance activities including standing w/o UE support, marching, horseshoes, reaching across midline w/o LOB, but O2 sats decreased to 88% on 3 LPM.  Increased O2 to 6 LPM and O2 sats decrease to 90-91%.  Pt requires seated rest breaks and education for breathing techniques.  Pt able to stand approx. 1'.  Pt amb up to 17' w/ RW and CGA, multiple trials.  Continue to progress strength and endurance for safe return to home.  Pt returned to recliner w/ all needs in reach.     Therapy Documentation Precautions:  Precautions Precautions: Fall Precaution Comments: monitor O2 ( used 6 liters for activity and 3 at rest) Restrictions Weight Bearing Restrictions: No General:   Vital Signs: please see note for O2 sats.  Pain: no c/o pain.      Therapy/Group: Individual Therapy  Lucio Edward 02/26/2019, 12:04 PM

## 2019-02-27 ENCOUNTER — Inpatient Hospital Stay (HOSPITAL_COMMUNITY): Payer: Medicare Other | Admitting: Physical Therapy

## 2019-02-27 ENCOUNTER — Inpatient Hospital Stay (HOSPITAL_COMMUNITY): Payer: Medicare Other | Admitting: Occupational Therapy

## 2019-02-27 MED ORDER — IPRATROPIUM-ALBUTEROL 0.5-2.5 (3) MG/3ML IN SOLN
3.0000 mL | Freq: Two times a day (BID) | RESPIRATORY_TRACT | Status: DC
  Administered 2019-02-27 – 2019-03-01 (×5): 3 mL via RESPIRATORY_TRACT
  Filled 2019-02-27 (×6): qty 3

## 2019-02-27 NOTE — Progress Notes (Signed)
Occupational Therapy Session Note  Patient Details  Name: Kurt [name] MRN: [id number] Date of Birth: [birth year]  Today's Date: 02/27/2019 OT Individual Time: 1417-1530 OT Individual Time Calculation (min): 73 min   Short Term Goals: Week 1:  OT Short Term Goal 1 (Week 1): LTG=STG  Skilled Therapeutic Interventions/Progress Updates:    Pt greeted in recliner with no c/o pain, tired from prior therapy but very motivated to participate. He was agreeable to shower. 02 sats on 2L at rest 96%, bumped him up to 3L for activity. Started with ambulatory transfer to TTB using RW with steady assist-close supervision. IV was covered and after pt doffed his clothing, he completed bathing while seated using lateral leans for pericare to conserve energy. 02 sats after shower 95%. Discussed with pt importance of keeping the door ajar at home during showering for ventilation. He then dressed sit<stand from edge of TTB with supervision for dynamic standing balance. 02 sats 94% when he was finished, decreasing to 89% after ambulating back to recliner. Pt rebounding to 92% in less than 10 seconds. Discussed energy conservation techniques to implement at home and provided pt with a paper handout for EC/WS during ADL/IADL participation. We also talked about his shower setup. Pt has a tub shower, uses a shower chair and a grab bar on wall to side-step into shower. Pt would benefit from practicing a shower transfer with this setup and also practicing TTB transfer to see which one will work best at home. He verbalized that he plans to have family present during showers at home. He also has several pulse oximeters that he can use to assess oxygen during his routine. Pt was left in recliner with all needs, on 2L, in care of RN. Tx focus placed on ADL retraining, dynamic balance, activity tolerance, and maintaining stable vitals during functional activity.    Therapy Documentation Precautions:  Precautions Precautions:  Fall Precaution Comments: monitor O2 ( used 6 liters for activity and 3 at rest) Restrictions Weight Bearing Restrictions: No Vital Signs: Therapy Vitals Temp: 99.7 F (37.6 C) Pulse Rate: 100 Resp: 20 BP: 115/72 Patient Position (if appropriate): Sitting Oxygen Therapy SpO2: 95 % O2 Device: Nasal Cannula O2 Flow Rate (L/min): 2 L/min ADL: ADL Grooming: Modified independent Upper Body Bathing: Not assessed Lower Body Bathing: Not assessed Upper Body Dressing: Independent Where Assessed-Upper Body Dressing: Chair Lower Body Dressing: Setup Where Assessed-Lower Body Dressing: Chair Toileting: Independent, Supervision/safety Toilet Transfer: Contact guard Toilet Transfer Method: Event organiser: Not assessed     Therapy/Group: Individual Therapy  Waylynn Benefiel A Edynn Gillock 02/27/2019, 3:58 PM

## 2019-02-27 NOTE — Progress Notes (Signed)
Physical Therapy Session Note  Patient Details  Name: Kurt [name] MRN: [id number] Date of Birth: 1950/03/27  Today's Date: 02/27/2019 PT Individual Time: 1000-1125 PT Individual Time Calculation (min): 85 min   Short Term Goals: Week 1:  PT Short Term Goal 1 (Week 1): STG=LTG due to short ELOS.  Skilled Therapeutic Interventions/Progress Updates:  Pt was seen bedside in the am. Increased O2 to 6 liters for treatment. Pt performed all transfers with S. Pt ambulated 50 feet x 4 and 75 feet x 4 with rolling walker and S. Pt performed cone taps 3 sets x 10 reps each for LE strengthening. Pt's O2 sats dropped into mid 80s with ambulation but able to recover greater than 90 with rest breaks. Pt performed toilet transfers with S and rolling walker. Pt returned to room and left sitting up in recliner with call bell within reach.   Therapy Documentation Precautions:  Precautions Precautions: Fall Precaution Comments: monitor O2 ( used 6 liters for activity and 3 at rest) Restrictions Weight Bearing Restrictions: No General:   Vital Signs: Oxygen Therapy SpO2: 96 % O2 Device: Nasal Cannula O2 Flow Rate (L/min): 2 L/min Pain: No c/o pain.   Therapy/Group: Individual Therapy  Rayford Halsted 02/27/2019, 11:20 AM

## 2019-02-27 NOTE — Progress Notes (Signed)
Physical Therapy Session Note  Patient Details  Name: Kurt [name] MRN: [id number] Date of Birth: Mar 21, 1950  Today's Date: 02/27/2019 PT Individual Time: 1302-1332 PT Individual Time Calculation (min): 30 min   Short Term Goals: Week 1:  PT Short Term Goal 1 (Week 1): STG=LTG due to short ELOS.  Skilled Therapeutic Interventions/Progress Updates:  Pt was seen bedside in the pm. Pt was S for all transfers with rolling walker. Pt ambulated 125, 50, and 75 feet with rolling walker and S. Pt rode Nu-step for 3, 3 and 4 minutes at level 3 with rest breaks. Pt returned to room following treatment and left sitting up in recliner with call bell within reach.   Therapy Documentation Precautions:  Precautions Precautions: Fall Precaution Comments: monitor O2 ( used 6 liters for activity and 3 at rest) Restrictions Weight Bearing Restrictions: No General:   Vital Signs: Therapy Vitals Temp: 99.7 F (37.6 C) Pulse Rate: 100 Resp: 20 BP: 115/72 Patient Position (if appropriate): Sitting Oxygen Therapy SpO2: 95 % O2 Device: Nasal Cannula O2 Flow Rate (L/min): 2 L/min Pain: No c/o pain.   Therapy/Group: Individual Therapy  Rayford Halsted 02/27/2019, 3:17 PM

## 2019-02-27 NOTE — Plan of Care (Signed)
  Problem: RH BOWEL ELIMINATION Goal: RH STG MANAGE BOWEL WITH ASSISTANCE Description: STG Manage Bowel with  Min Assistance. Outcome: Progressing Goal: RH STG MANAGE BOWEL W/MEDICATION W/ASSISTANCE Description: STG Manage Bowel with Medication with Min  Assistance. Outcome: Progressing   Problem: RH BLADDER ELIMINATION Goal: RH STG MANAGE BLADDER WITH ASSISTANCE Description: STG Manage Bladder With Min Assistance Outcome: Progressing   Problem: RH SKIN INTEGRITY Goal: RH STG SKIN FREE OF INFECTION/BREAKDOWN Outcome: Progressing Goal: RH STG MAINTAIN SKIN INTEGRITY WITH ASSISTANCE Description: STG Maintain Skin Integrity With Min Assistance. Outcome: Progressing   Problem: RH SAFETY Goal: RH STG ADHERE TO SAFETY PRECAUTIONS W/ASSISTANCE/DEVICE Description: STG Adhere to Safety Precautions With Min  Assistance/Device. Outcome: Progressing Goal: RH STG DECREASED RISK OF FALL WITH ASSISTANCE Description: STG Decreased Risk of Fall With [employer]. Outcome: Progressing   Problem: RH PAIN MANAGEMENT Goal: RH STG PAIN MANAGED AT OR BELOW PT'S PAIN GOAL Description: Pt will be free of pain Outcome: Progressing   Problem: RH KNOWLEDGE DEFICIT GENERAL Goal: RH STG INCREASE KNOWLEDGE OF SELF CARE AFTER HOSPITALIZATION Outcome: Progressing   Problem: Consults Goal: RH GENERAL PATIENT EDUCATION Description: See Patient Education module for education specifics. Outcome: Progressing

## 2019-02-27 NOTE — Progress Notes (Signed)
[place] PHYSICAL MEDICINE & REHABILITATION PROGRESS NOTE   Subjective/Complaints:   Getting flonase now- sore throat is much better than it was intially, but still an issue at night. Doing "fine" currently- still waking up a lot at night  Tired today- feels like Duonebs are helpful for SOB/breathing.     ROS; pt denies CP, abd pain, N/V/C/D, Headache, vision changes- does endorse sore throat and intermittent SOB when moving or talking a lot   Objective:   No results found. Recent Labs    02/26/19 0620  WBC 10.6*  HGB 11.6*  HCT 37.7*  PLT 414*   Recent Labs    02/26/19 0620  NA 138  K 4.2  CL 99  CO2 30  GLUCOSE 90  BUN 28*  CREATININE 1.23  CALCIUM 8.9    Intake/Output Summary (Last 24 hours) at 02/27/2019 1316 Last data filed at 02/27/2019 0810 Gross per 24 hour  Intake 620 ml  Output 1600 ml  Net -980 ml     Physical Exam: Vital Signs Blood pressure 130/81, pulse 80, temperature 98.8 F (37.1 C), resp. rate 17, weight 115 kg, SpO2 96 %.   Awake, alert, appropriate, sitting at bedside wearing 2L Of O2- appears comfortable, no accessory muscle use noted; reading; NAD HENT:  Head: Normocephalic and atraumatic.  Mouth/Throat: Oropharyngeal exudate present.  Tongue slightly coated, but MUCH improved and erythema almost gone at back of throat Eyes: conjugate gaze Neck: No tracheal deviation present.  Cardiovascular: RRR Respiratory: good air movement overall; but slightly decreased at bases; no W/R/R - no accessory muscle use at rest GI: Soft, NT, ND, (+BS Musculoskeletal:        Comments: UEs- deltoid 3-/5 B/L; 5-/5 in arms biceps, triceps, WE, grip, finger abd B/L LEs- HF, KE, KF, DF, PF all 5-/5 B/L  Neurological: He is alert and oriented to person, place, and time. Decreased sensation to light touch in LEs; and LUE to elbow has decreased sensation to light touch.  Intact sensation in RUE.  Skin: Skin is warm and dry.  Bruise on knee   Psychiatric:  Less anxious; brighter         Assessment/Plan: 1. Functional deficits secondary to debility from 3rd case of pneumonia since COVID 12/20- currently pseudomonas pneumonia which require 3+ hours per day of interdisciplinary therapy in a comprehensive inpatient rehab setting.  Physiatrist is providing close team supervision and 24 hour management of active medical problems listed below.  Physiatrist and rehab team continue to assess barriers to discharge/monitor patient progress toward functional and medical goals  Care Tool:  Bathing  Bathing activity did not occur: Safety/medical concerns(declined this session due to fatigue)           Bathing assist       Upper Body Dressing/Undressing Upper body dressing   What is the patient wearing?: Pull over shirt    Upper body assist Assist Level: Independent    Lower Body Dressing/Undressing Lower body dressing      What is the patient wearing?: Pants     Lower body assist Assist for lower body dressing: Supervision/Verbal cueing     Toileting Toileting    Toileting assist Assist for toileting: Supervision/Verbal cueing Assistive Device Comment: urinal   Transfers Chair/bed transfer  Transfers assist     Chair/bed transfer assist level: Supervision/Verbal cueing     Locomotion Ambulation   Ambulation assist      Assist level: Supervision/Verbal cueing Assistive device: Walker-rolling Max distance: 75  Walk 10 feet activity   Assist     Assist level: Supervision/Verbal cueing Assistive device: Walker-rolling   Walk 50 feet activity   Assist    Assist level: Supervision/Verbal cueing Assistive device: Walker-rolling    Walk 150 feet activity   Assist Walk 150 feet activity did not occur: Safety/medical concerns         Walk 10 feet on uneven surface  activity   Assist Walk 10 feet on uneven surfaces activity did not occur: Safety/medical concerns(decreased  activity tolerance/balance)         Wheelchair     Assist   Type of Wheelchair: Manual    Wheelchair assist level: Supervision/Verbal cueing Max wheelchair distance: 150'    Wheelchair 50 feet with 2 turns activity    Assist        Assist Level: Supervision/Verbal cueing   Wheelchair 150 feet activity     Assist      Assist Level: Supervision/Verbal cueing   Blood pressure 130/81, pulse 80, temperature 98.8 F (37.1 C), resp. rate 17, weight 115 kg, SpO2 96 %.  1.  Impaired mobility, function, and ADLs secondary to debility from  Pseudomonas pneumonia s/p 3rd recent hospitalization for pneumonia- initial hospital stay for COVID pneumonia.              -patient may shower             -ELOS/Goals: 2- 2.5 weeks; goals mod I 2.  Antithrombotics: -DVT/anticoagulation:  Pharmaceutical: Lovenox             -antiplatelet therapy: N/a 3. Chronic back pain/Pain Management: Hydrocodone prn  4. H/o depression/Mood: team to provide ego support and help with anxiety.              -antipsychotic agents: N/A 5. Neuropsych: This patient is capable of making decisions on his own behalf. 6. Skin/Wound Care: routine pressure relief measures.  7. Fluids/Electrolytes/Nutrition: Monitor I/O. Check lytes in am.   2/3- lytes and Cr/OK 8. 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 Bactrim DS MWF for PJP prophylaxis. Duonebs tid  2/3- Duonebs made prn - encouraged pt to ask for them when coughing or SOB. 10. OSA: Encourage CPAP use at nights.  11. Pharyngeal mucositis: MMW Qid (was TID) and Nystatin mouth wash qid and trial of nebulized lidocaine added on  2/1. Will also add Diflucan 200 mg x 1 and 100 daily x 6 days.  2/4- added Flonase for sore throat   2/5- improved on exam and by hx  12. CKD Stage IIIa: Monitor BMET with serial checks.   2/3- Cr 1.12 and BUN slightly elevated at  25- a little dry- will encourage fluid intake.   2/6- recheck Monday  13. H/o recurrent GIB/chronic gastritis 05/2018: PPI recommended indefinitely-- will resume with recent use of NSAIDs as well as steroids on board.  14. Chronic diastolic CHF- will monitor daily weights and treat as required.   2/3- will order daily weights   Filed Weights   02/26/19 1700  Weight: 115 kg    2/6- will follow for trend  15. Leukocytosis  2/3- on Prednisone 60 mg daily currently- could be that, however of note, last WBC was 8.8- so unclear- afebrile- will con't to monitor closely for signs of additional infection.   2/4- will recheck labs in AM  2/5- WBC down to 10.6 16. Prolonged QT interval  2/5- cannot prescribe Trazodone- will try Melatonin  2/6- had Melatonin 6 mg QHS- pt said better, but still wakes due to throat  LOS: 4 days A FACE TO FACE EVALUATION WAS PERFORMED  Clyde Upshaw 02/27/2019, 1:16 PM

## 2019-02-28 ENCOUNTER — Inpatient Hospital Stay (HOSPITAL_COMMUNITY): Payer: Medicare Other | Admitting: Physical Therapy

## 2019-02-28 NOTE — Progress Notes (Signed)
Pt refuses to wear CPAP. D/c order per RT protocol

## 2019-02-28 NOTE — Progress Notes (Signed)
Pt ref CPAP while here

## 2019-02-28 NOTE — Progress Notes (Signed)
[facility] PHYSICAL MEDICINE & REHABILITATION PROGRESS NOTE   Subjective/Complaints:   Pt reports lungs are doing better- less coughing, sore throat improving as well daily- still worse at night.   Cough isn't as raspy and deep sounding.   ROS; pt denies CP, abd pain, N/V/C/D, Headache, vision changes- does endorse sore throat and intermittent SOB when moving or talking a lot   Objective:   No results found. Recent Labs    02/26/19 0620  WBC 10.6*  HGB 11.6*  HCT 37.7*  PLT 414*   Recent Labs    02/26/19 0620  NA 138  K 4.2  CL 99  CO2 30  GLUCOSE 90  BUN 28*  CREATININE 1.23  CALCIUM 8.9    Intake/Output Summary (Last 24 hours) at 02/28/2019 1225 Last data filed at 02/28/2019 1116 Gross per 24 hour  Intake 720 ml  Output 2100 ml  Net -1380 ml     Physical Exam: Vital Signs Blood pressure 133/82, pulse 67, temperature 98 F (36.7 C), resp. rate 18, weight 115 kg, SpO2 95 %.   Awake, alert, appropriate, sitting at bedside wearing 2L Of O2- appears comfortable, no accessory muscle use noted; reading again; watching TV as well; NAD HENT:  Head: Normocephalic and atraumatic.  Mouth/Throat: Oropharyngeal exudate present.  Tongue slightly coated, but MUCH improved and erythema almost gone at back of throat Eyes: conjugate gaze Neck: No tracheal deviation present.  Cardiovascular: RRR Respiratory: almost completely CTA B/L- had cough x 1 while in room- not as raspy or deep sounding; O2 by [place] 2L-  GI: Soft, NT, ND, (+BS Musculoskeletal:        Comments: UEs- deltoid 3-/5 B/L; 5-/5 in arms biceps, triceps, WE, grip, finger abd B/L LEs- HF, KE, KF, DF, PF all 5-/5 B/L  Neurological: He is alert and oriented to person, place, and time. Decreased sensation to light touch in LEs; and LUE to elbow has decreased sensation to light touch.  Intact sensation in RUE.  Skin: Skin is warm and dry.  Bruise on knee  Psychiatric:  Less anxious; brighter          Assessment/Plan: 1. Functional deficits secondary to debility from 3rd case of pneumonia since COVID 12/20- currently pseudomonas pneumonia which require 3+ hours per day of interdisciplinary therapy in a comprehensive inpatient rehab setting.  Physiatrist is providing close team supervision and 24 hour management of active medical problems listed below.  Physiatrist and rehab team continue to assess barriers to discharge/monitor patient progress toward functional and medical goals  Care Tool:  Bathing  Bathing activity did not occur: Safety/medical concerns(declined this session due to fatigue) Body parts bathed by patient: Right arm, Left arm, Chest, Abdomen, Front perineal area, Buttocks, Right upper leg, Left upper leg, Right lower leg, Left lower leg, Face         Bathing assist Assist Level: Set up assist     Upper Body Dressing/Undressing Upper body dressing   What is the patient wearing?: Pull over shirt    Upper body assist Assist Level: Set up assist    Lower Body Dressing/Undressing Lower body dressing      What is the patient wearing?: Pants, Underwear/pull up     Lower body assist Assist for lower body dressing: Supervision/Verbal cueing     Toileting Toileting    Toileting assist Assist for toileting: Supervision/Verbal cueing Assistive Device Comment: urinal   Transfers Chair/bed transfer  Transfers assist     Chair/bed transfer assist level:  Supervision/Verbal cueing     Locomotion Ambulation   Ambulation assist      Assist level: Supervision/Verbal cueing Assistive device: Walker-rolling Max distance: 125   Walk 10 feet activity   Assist     Assist level: Supervision/Verbal cueing Assistive device: Walker-rolling   Walk 50 feet activity   Assist    Assist level: Supervision/Verbal cueing Assistive device: Walker-rolling    Walk 150 feet activity   Assist Walk 150 feet activity did not occur: Safety/medical  concerns         Walk 10 feet on uneven surface  activity   Assist Walk 10 feet on uneven surfaces activity did not occur: Safety/medical concerns(decreased activity tolerance/balance)         Wheelchair     Assist   Type of Wheelchair: Manual    Wheelchair assist level: Supervision/Verbal cueing Max wheelchair distance: 150'    Wheelchair 50 feet with 2 turns activity    Assist        Assist Level: Supervision/Verbal cueing   Wheelchair 150 feet activity     Assist      Assist Level: Supervision/Verbal cueing   Blood pressure 133/82, pulse 67, temperature 98 F (36.7 C), resp. rate 18, weight 115 kg, SpO2 95 %.  1.  Impaired mobility, function, and ADLs secondary to debility from  Pseudomonas pneumonia s/p 3rd recent hospitalization for pneumonia- initial hospital stay for COVID pneumonia.              -patient may shower             -ELOS/Goals: 2- 2.5 weeks; goals mod I 2.  Antithrombotics: -DVT/anticoagulation:  Pharmaceutical: Lovenox             -antiplatelet therapy: N/a 3. Chronic back pain/Pain Management: Hydrocodone prn  4. H/o depression/Mood: team to provide ego support and help with anxiety.              -antipsychotic agents: N/A 5. Neuropsych: This patient is capable of making decisions on his own behalf. 6. Skin/Wound Care: routine pressure relief measures.  7. Fluids/Electrolytes/Nutrition: Monitor I/O. Check lytes in am.   2/3- lytes and Cr/OK 8. 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 Bactrim DS MWF for PJP prophylaxis. Duonebs tid  2/3- Duonebs made prn - encouraged pt to ask for them when coughing or SOB. 10. OSA: Encourage CPAP use at nights.  11. Pharyngeal mucositis: MMW Qid (was TID) and Nystatin mouth wash qid and trial of nebulized lidocaine added on  2/1. Will also add Diflucan 200 mg x 1 and 100 daily x 6 days.  2/4-  added Flonase for sore throat   2/5- improved on exam and by hx  12. CKD Stage IIIa: Monitor BMET with serial checks.   2/3- Cr 1.12 and BUN slightly elevated at 25- a little dry- will encourage fluid intake.   2/6- recheck Monday  13. H/o recurrent GIB/chronic gastritis 05/2018: PPI recommended indefinitely-- will resume with recent use of NSAIDs as well as steroids on board.  14. Chronic diastolic CHF- will monitor daily weights and treat as required.   2/3- will order daily weights   Filed Weights   02/26/19 1700  Weight: 115 kg    2/6- will follow for trend  2/7- no weights since 2/5- will ask nursing to do.   15. Leukocytosis  2/3- on Prednisone 60 mg daily currently- could  be that, however of note, last WBC was 8.8- so unclear- afebrile- will con't to monitor closely for signs of additional infection.   2/4- will recheck labs in AM  2/5- WBC down to 10.6 16. Prolonged QT interval  2/5- cannot prescribe Trazodone- will try Melatonin  2/6- had Melatonin 6 mg QHS- pt said better, but still wakes due to throat  LOS: 5 days A FACE TO FACE EVALUATION WAS PERFORMED  Shanetha Bradham 02/28/2019, 12:25 PM

## 2019-02-28 NOTE — Progress Notes (Signed)
Physical Therapy Session Note  Patient Details  Name: Kurt [name] MRN: [id number] Date of Birth: 04-17-1950  Today's Date: 02/28/2019 PT Individual Time: 2831-5176 PT Individual Time Calculation (min): 55 min   Short Term Goals: Week 1:  PT Short Term Goal 1 (Week 1): STG=LTG due to short ELOS.  Skilled Therapeutic Interventions/Progress Updates:  Pt was seen bedside in the pm. O2 increased to 6 liters for activity. Pt performed all transfers with rolling walker and S. Pt ambulated 100 feet x 2, 75 feet x 2 and 15 x2 with rolling walker and S with verbal cues. Pt ascended/descended 4 stairs x 1 with 1 rail and 8 stairs x 2 with 1 rail and S to c/g with verbal cues. Pt returned to room and left sitting up in recliner with all needs within reach.   Therapy Documentation Precautions:  Precautions Precautions: Fall Precaution Comments: monitor O2 ( used 6 liters for activity and 3 at rest) Restrictions Weight Bearing Restrictions: No General:   Vital Signs:  O2 Device: Nasal Cannula O2 Flow Rate (L/min): 2 L/min Pain: No c/o pain.   Therapy/Group: Individual Therapy  Rayford Halsted 02/28/2019, 2:38 PM

## 2019-03-01 ENCOUNTER — Inpatient Hospital Stay: Payer: Medicare Other | Admitting: Primary Care

## 2019-03-01 ENCOUNTER — Inpatient Hospital Stay: Payer: Medicare Other | Admitting: Pulmonary Disease

## 2019-03-01 ENCOUNTER — Inpatient Hospital Stay (HOSPITAL_COMMUNITY): Payer: Medicare Other

## 2019-03-01 ENCOUNTER — Inpatient Hospital Stay (HOSPITAL_COMMUNITY): Payer: Medicare Other | Admitting: Physical Therapy

## 2019-03-01 ENCOUNTER — Inpatient Hospital Stay (HOSPITAL_COMMUNITY): Payer: Medicare Other | Admitting: Occupational Therapy

## 2019-03-01 LAB — BASIC METABOLIC PANEL
Anion gap: 10 (ref 5–15)
BUN: 28 mg/dL — ABNORMAL HIGH (ref 8–23)
CO2: 29 mmol/L (ref 22–32)
Calcium: 8.8 mg/dL — ABNORMAL LOW (ref 8.9–10.3)
Chloride: 98 mmol/L (ref 98–111)
Creatinine, Ser: 1.37 mg/dL — ABNORMAL HIGH (ref 0.61–1.24)
GFR calc Af Amer: >60 mL/min (ref >60)
GFR calc non Af Amer: 52 mL/min — ABNORMAL LOW (ref >60)
Glucose, Bld: 106 mg/dL — ABNORMAL HIGH (ref 70–99)
Potassium: 4.3 mmol/L (ref 3.5–5.1)
Sodium: 137 mmol/L (ref 135–145)

## 2019-03-01 LAB — CBC
HCT: 36 % — ABNORMAL LOW (ref 39.0–52.0)
Hemoglobin: 11.4 g/dL — ABNORMAL LOW (ref 13.0–17.0)
MCH: 27.3 pg (ref 26.0–34.0)
MCHC: 31.7 g/dL (ref 30.0–36.0)
MCV: 86.3 fL (ref 80.0–100.0)
Platelets: 368 10*3/uL (ref 150–400)
RBC: 4.17 MIL/uL — ABNORMAL LOW (ref 4.22–5.81)
RDW: 15.6 % — ABNORMAL HIGH (ref 11.5–15.5)
WBC: 13.4 10*3/uL — ABNORMAL HIGH (ref 4.0–10.5)
nRBC: 0 % (ref 0.0–0.2)

## 2019-03-01 NOTE — Progress Notes (Signed)
Occupational Therapy Session Note  Patient Details  Name: Kurt [name] MRN: [id number] Date of Birth: May 06, 1950  Today's Date: 03/01/2019 OT Individual Time: 1000-1100 OT Individual Time Calculation (min): 60 min    Short Term Goals: Week 1:  OT Short Term Goal 1 (Week 1): LTG=STG  Skilled Therapeutic Interventions/Progress Updates:    Patient seated in recliner, ready for therapy session.  O2 via [place] at 2L at rest, 4L during ambulation and TA.   Ambulation with RW CS - completed in room short distances x 3, long walk in hallway 240 feet, he is able to propel w/c with UB and LB to/from treatment spaces.  Reviewed and practiced DME for bathroom with CS - he would like tub transfer bench and 3 in 1 commode for discharge to home.   Completed seated and standing conditioning activities and posture/proximal stability activities with rest breaks between.  O2 saturation remained between 87-93 after activity with recovery to mid-90's within 30 seconds.  Patient returned to recliner at close of session.  Call bell and tray table in reach.    Therapy Documentation Precautions:  Precautions Precautions: Fall Precaution Comments: monitor O2 ( used 6 liters for activity and 3 at rest) Restrictions Weight Bearing Restrictions: No General:   Vital Signs:   Pain: Pain Assessment Pain Scale: 0-10 Pain Score: 0-No pain Faces Pain Scale: No hurt   Other Treatments:     Therapy/Group: Individual Therapy  Barrie Lyme 03/01/2019, 12:34 PM

## 2019-03-01 NOTE — Progress Notes (Signed)
Physical Therapy Session Note  Patient Details  Name: Kurt [name] MRN: [id number] Date of Birth: 04/23/1950  Today's Date: 03/01/2019 PT Individual Time: 0801-0915 PT Individual Time Calculation (min): 74 min   Short Term Goals: Week 1:  PT Short Term Goal 1 (Week 1): STG=LTG due to short ELOS.  Skilled Therapeutic Interventions/Progress Updates: Pt presents seated in recliner chair agreeable to therapy.  Pt negotiated w/c through room and hallway down to gym, using bilateral UE and LEs to increase endurance.  Pt performed multiple standing dynamic balance activities to include baskets in portable basket 3-4 trials of 15 baskets, toe taps on 3 3/4 platform, stance on floor and cushioned surface 30 secs to 1' w/ eyes open and closed, unilateral stance on 3 3/4' platform stacking/unstacking cones crossing midline and w/o UE support.  Pt required seated rest breaks between trials w/ O2 sats decreased to 91% on 4 LPM.  Pt amb multiple trils w/ FWW and SBA up to 100' including turns to return to seat.  Pt required verbal cues for safety w/ turns.  O2 sats rmained > 91% w/ activity, except last trial decreased to 89% and O2 increased to 6 LPM.  Pt negotiated to room and then amb w/ FWW into BR and then to sink and back to recliner.  All needs in reach.     Therapy Documentation Precautions:  Precautions Precautions: Fall Precaution Comments: monitor O2 ( used 6 liters for activity and 3 at rest) Restrictions Weight Bearing Restrictions: No General:   Vital Signs:   Pain:  0/10      Therapy/Group: Individual Therapy  Lucio Edward 03/01/2019, 10:53 AM

## 2019-03-01 NOTE — Progress Notes (Signed)
Physical Therapy Session Note  Patient Details  Name: Kurt [name] MRN: [id number] Date of Birth: 22-Mar-1950  Today's Date: 03/01/2019 PT Individual Time: 6073-7106 PT Individual Time Calculation (min): 72 min   Short Term Goals: Week 1:  PT Short Term Goal 1 (Week 1): STG=LTG due to short ELOS.  Skilled Therapeutic Interventions/Progress Updates:     Patient in recliner in room on 2L/min O2 at rest upon PT arrival. Patient alert and agreeable to PT session. Patient denied pain during session.  Therapeutic Activity: Transfers: Patient performed sit to/from stand x9 from a recliner, w/c, BSC, and standard arm chair with supervision for safety with and without a RW. Provided verbal cues for forward weight shift when standing and reaching back to sit for safety. He was continent of urine, with urgency, during session and required supervision for voiding in standing, peri-care, and LB dressing.   Gait Training:  Patient ambulated 165 feet, 110 feet, and 55 feet using RW with close supervision for safety. Ambulated with lateral trunk sway, decreased step length and height, wide BOS, and forward trunk flexion. He also ambulated 55 feet x2 with CGA without an AD, noted increased lateral sway, decreased step height, and increased SOB without AD. Provided verbal cues for erect posture, pace breathing, increased step height, paced steps to promote increased distance, and staying close to the RW for safety. Patient ascended/descended 8 steps using 1-2 rails x1 and B UEs on L rail x1 with CGA for safey/balance. Performed step-to and intermittent reciprocal gait pattern on first trial and step-to throughout on second trial using side-stepping technique. Provided cues for technique and sequencing and demonstration of side-stepping technique.  Practiced diaphragmatic breathing during seated rest breaks between ambulation and stair trials with intermittent tactile cues for technique. Patient was on 2L/min  during first ambulation tria l (165 ft) and desaturated to 82% and did not recover in 1.5 min with pursed lip breathing. Titrated up to 4L and patient recovered to >90% in <30 sec. On all other ambulation and stair trials he remained on 4L/min at >90% throughout.   Patient in recliner on 2L/min O2 in room at end of session with breaks locked, chair alarm set, and all needs within reach. Provided patient with a oxygen log to chart O2 requirements at rest and with activity each day and place above patient's bed. Discussed patient's goals at d/c and POC to be discussed in team conference tomorrow. Patient is addiment that he needs to be 100% independent at d/c and states that his son will come over to provide supervision while he showers in the evenings, but that he otherwise has no other assistance at d/c. He is also asking about driving at d/c following a discussion about OPPT for follow-up therapy.     Therapy Documentation Precautions:  Precautions Precautions: Fall Precaution Comments: monitor O2 ( used 6 liters for activity and 3 at rest) Restrictions Weight Bearing Restrictions: No    Therapy/Group: Individual Therapy  Ming Kunka L Jakerria Kingbird PT, DPT  03/01/2019, 5:42 PM

## 2019-03-01 NOTE — Plan of Care (Signed)
  Problem: RH BOWEL ELIMINATION Goal: RH STG MANAGE BOWEL WITH ASSISTANCE Description: STG Manage Bowel with  Min Assistance. Outcome: Progressing Goal: RH STG MANAGE BOWEL W/MEDICATION W/ASSISTANCE Description: STG Manage Bowel with Medication with Min  Assistance. Outcome: Progressing   Problem: RH BLADDER ELIMINATION Goal: RH STG MANAGE BLADDER WITH ASSISTANCE Description: STG Manage Bladder With Min Assistance Outcome: Progressing   Problem: RH SKIN INTEGRITY Goal: RH STG SKIN FREE OF INFECTION/BREAKDOWN Outcome: Progressing Goal: RH STG MAINTAIN SKIN INTEGRITY WITH ASSISTANCE Description: STG Maintain Skin Integrity With Min Assistance. Outcome: Progressing   Problem: RH SAFETY Goal: RH STG ADHERE TO SAFETY PRECAUTIONS W/ASSISTANCE/DEVICE Description: STG Adhere to Safety Precautions With Min  Assistance/Device. Outcome: Progressing Goal: RH STG DECREASED RISK OF FALL WITH ASSISTANCE Description: STG Decreased Risk of Fall With Min Assistance. Outcome: Progressing   Problem: RH PAIN MANAGEMENT Goal: RH STG PAIN MANAGED AT OR BELOW PT'S PAIN GOAL Description: Pt will be free of pain Outcome: Progressing   Problem: RH KNOWLEDGE DEFICIT GENERAL Goal: RH STG INCREASE KNOWLEDGE OF SELF CARE AFTER HOSPITALIZATION Outcome: Progressing   Problem: Consults Goal: RH GENERAL PATIENT EDUCATION Description: See Patient Education module for education specifics. Outcome: Progressing   

## 2019-03-01 NOTE — Progress Notes (Signed)
[facility] PHYSICAL MEDICINE & REHABILITATION PROGRESS NOTE   Subjective/Complaints: Complains of mild SOB. Said he ambulated a lot with therapy this morning. Denies pain.  Increase in WBC from last check, but similar to 2/3; denies fever or chills.   ROS; pt denies CP, abd pain, N/V/C/D, Headache, vision changes- does endorse sore throat and intermittent SOB when moving or talking a lot   Objective:   No results found. Recent Labs    03/01/19 0521  WBC 13.4*  HGB 11.4*  HCT 36.0*  PLT 368   Recent Labs    03/01/19 0521  NA 137  K 4.3  CL 98  CO2 29  GLUCOSE 106*  BUN 28*  CREATININE 1.37*  CALCIUM 8.8*    Intake/Output Summary (Last 24 hours) at 03/01/2019 1154 Last data filed at 03/01/2019 0800 Gross per 24 hour  Intake 840 ml  Output 2275 ml  Net -1435 ml     Physical Exam: Vital Signs Blood pressure 140/87, pulse 66, temperature 98.1 F (36.7 C), resp. rate 19, weight 115 kg, SpO2 98 %.   Awake, alert, appropriate, sitting in WC in hallway taking rest break from therapy.  HENT:  Head: Normocephalic and atraumatic.  Mouth/Throat: Oropharyngeal exudate present.  Tongue slightly coated, but MUCH improved and erythema almost gone at back of throat Eyes: conjugate gaze Neck: No tracheal deviation present.  Cardiovascular: RRR Respiratory: almost completely CTA B/L- had cough x 1 while in room- not as raspy or deep sounding; O2 by [place] 2L-  GI: Soft, NT, ND, (+BS Musculoskeletal:        Comments: UEs- deltoid 3-/5 B/L; 5-/5 in arms biceps, triceps, WE, grip, finger abd B/L LEs- HF, KE, KF, DF, PF all 5-/5 B/L  Neurological: He is alert and oriented to person, place, and time. Decreased sensation to light touch in LEs; and LUE to elbow has decreased sensation to light touch.  Intact sensation in RUE.  Skin: Skin is warm and dry.  Bruise on knee  Psychiatric:  Less anxious; brighter         Assessment/Plan: 1. Functional deficits secondary to  debility from 3rd case of pneumonia since COVID 12/20- currently pseudomonas pneumonia which require 3+ hours per day of interdisciplinary therapy in a comprehensive inpatient rehab setting.  Physiatrist is providing close team supervision and 24 hour management of active medical problems listed below.  Physiatrist and rehab team continue to assess barriers to discharge/monitor patient progress toward functional and medical goals  Care Tool:  Bathing  Bathing activity did not occur: Safety/medical concerns(declined this session due to fatigue) Body parts bathed by patient: Right arm, Left arm, Chest, Abdomen, Front perineal area, Buttocks, Right upper leg, Left upper leg, Right lower leg, Left lower leg, Face         Bathing assist Assist Level: Set up assist     Upper Body Dressing/Undressing Upper body dressing   What is the patient wearing?: Pull over shirt    Upper body assist Assist Level: Set up assist    Lower Body Dressing/Undressing Lower body dressing      What is the patient wearing?: Pants, Underwear/pull up     Lower body assist Assist for lower body dressing: Supervision/Verbal cueing     Toileting Toileting    Toileting assist Assist for toileting: Supervision/Verbal cueing Assistive Device Comment: urinal   Transfers Chair/bed transfer  Transfers assist     Chair/bed transfer assist level: Supervision/Verbal cueing     Locomotion Ambulation  Ambulation assist      Assist level: Supervision/Verbal cueing Assistive device: Walker-rolling Max distance: 100   Walk 10 feet activity   Assist     Assist level: Supervision/Verbal cueing Assistive device: Walker-rolling   Walk 50 feet activity   Assist    Assist level: Supervision/Verbal cueing Assistive device: Walker-rolling    Walk 150 feet activity   Assist Walk 150 feet activity did not occur: Safety/medical concerns         Walk 10 feet on uneven surface   activity   Assist Walk 10 feet on uneven surfaces activity did not occur: Safety/medical concerns(decreased activity tolerance/balance)         Wheelchair     Assist Will patient use wheelchair at discharge?: No Type of Wheelchair: Manual    Wheelchair assist level: Supervision/Verbal cueing Max wheelchair distance: 150    Wheelchair 50 feet with 2 turns activity    Assist        Assist Level: Supervision/Verbal cueing   Wheelchair 150 feet activity     Assist      Assist Level: Supervision/Verbal cueing   Blood pressure 140/87, pulse 66, temperature 98.1 F (36.7 C), resp. rate 19, weight 115 kg, SpO2 98 %.  1.  Impaired mobility, function, and ADLs secondary to debility from  Pseudomonas pneumonia s/p 3rd recent hospitalization for pneumonia- initial hospital stay for COVID pneumonia.              -patient may shower             -ELOS/Goals: 2- 2.5 weeks; goals mod I  Continue CIR PT and OT, progressing well.  2.  Antithrombotics: -DVT/anticoagulation:  Pharmaceutical: Lovenox             -antiplatelet therapy: N/a 3. Chronic back pain/Pain Management: Hydrocodone prn  -well controlled; not requiring Hydrocodone.   4. H/o depression/Mood: team to provide ego support and help with anxiety.              -antipsychotic agents: N/A 5. Neuropsych: This patient is capable of making decisions on his own behalf. 6. Skin/Wound Care: routine pressure relief measures.  7. Fluids/Electrolytes/Nutrition: Monitor I/O. Check lytes in am.   2/3- lytes and Cr/OK  2/8: I have personally reviewed labs. Creatinine continues to trend upward. 1.37 today. Encourage fluid intake; will place general nursing order. Will repeat BMP tomorrow to trend. If continues to rise, will consider IV fluids.  8. 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 Bactrim DS MWF for PJP prophylaxis.  Duonebs tid  2/3- Duonebs made prn - encouraged pt to ask for them when coughing or SOB. 10. OSA: Encourage CPAP use at nights.  11. Pharyngeal mucositis: MMW Qid (was TID) and Nystatin mouth wash qid and trial of nebulized lidocaine added on  2/1. Will also add Diflucan 200 mg x 1 and 100 daily x 6 days.  2/4- added Flonase for sore throat   2/5- improved on exam and by hx  12. CKD Stage IIIa: Monitor BMET with serial checks.   2/3- Cr 1.12 and BUN slightly elevated at 25- a little dry- will encourage fluid intake.   2/6- recheck Monday   2/8: See #7 13. H/o recurrent GIB/chronic gastritis 05/2018: PPI recommended indefinitely-- will resume with recent use of NSAIDs as well as steroids on board.  14. Chronic diastolic CHF- will monitor daily weights and treat as  required.   2/3- will order daily weights   Filed Weights   02/26/19 1700  Weight: 115 kg    2/6- will follow for trend  2/7- no weights since 2/5- will ask nursing to do.   2/8: No weights; will place general nursing instruction requesting daily weights.   15. Leukocytosis  2/3- on Prednisone 60 mg daily currently- could be that, however of note, last WBC was 8.8- so unclear- afebrile- will con't to monitor closely for signs of additional infection.   2/4- will recheck labs in AM  2/5- WBC down to 10.6  2/8: Back up to 13.4, asymptomatic. Will repeat tomorrow to trend.  16. Prolonged QT interval  2/5- cannot prescribe Trazodone- will try Melatonin  2/6- had Melatonin 6 mg QHS- pt said better, but still wakes due to throat  LOS: 6 days A FACE TO FACE EVALUATION WAS PERFORMED  Clint Bolder P Arjen Deringer 03/01/2019, 11:54 AM

## 2019-03-02 ENCOUNTER — Inpatient Hospital Stay (HOSPITAL_COMMUNITY): Payer: Medicare Other | Admitting: Occupational Therapy

## 2019-03-02 ENCOUNTER — Inpatient Hospital Stay (HOSPITAL_COMMUNITY): Payer: Medicare Other

## 2019-03-02 LAB — URINALYSIS, ROUTINE W REFLEX MICROSCOPIC
Bilirubin Urine: NEGATIVE
Glucose, UA: NEGATIVE mg/dL
Hgb urine dipstick: NEGATIVE
Ketones, ur: NEGATIVE mg/dL
Leukocytes,Ua: NEGATIVE
Nitrite: NEGATIVE
Protein, ur: NEGATIVE mg/dL
Specific Gravity, Urine: 1.012 (ref 1.005–1.030)
pH: 6 (ref 5.0–8.0)

## 2019-03-02 LAB — BASIC METABOLIC PANEL
Anion gap: 11 (ref 5–15)
BUN: 31 mg/dL — ABNORMAL HIGH (ref 8–23)
CO2: 28 mmol/L (ref 22–32)
Calcium: 9.1 mg/dL (ref 8.9–10.3)
Chloride: 97 mmol/L — ABNORMAL LOW (ref 98–111)
Creatinine, Ser: 1.52 mg/dL — ABNORMAL HIGH (ref 0.61–1.24)
GFR calc Af Amer: 53 mL/min — ABNORMAL LOW (ref >60)
GFR calc non Af Amer: 46 mL/min — ABNORMAL LOW (ref >60)
Glucose, Bld: 98 mg/dL (ref 70–99)
Potassium: 4 mmol/L (ref 3.5–5.1)
Sodium: 136 mmol/L (ref 135–145)

## 2019-03-02 LAB — CBC
HCT: 38.5 % — ABNORMAL LOW (ref 39.0–52.0)
Hemoglobin: 12 g/dL — ABNORMAL LOW (ref 13.0–17.0)
MCH: 27.2 pg (ref 26.0–34.0)
MCHC: 31.2 g/dL (ref 30.0–36.0)
MCV: 87.3 fL (ref 80.0–100.0)
Platelets: 328 10*3/uL (ref 150–400)
RBC: 4.41 MIL/uL (ref 4.22–5.81)
RDW: 15.4 % (ref 11.5–15.5)
WBC: 13.9 10*3/uL — ABNORMAL HIGH (ref 4.0–10.5)
nRBC: 0 % (ref 0.0–0.2)

## 2019-03-02 MED ORDER — IPRATROPIUM-ALBUTEROL 0.5-2.5 (3) MG/3ML IN SOLN
3.0000 mL | RESPIRATORY_TRACT | Status: DC | PRN
  Administered 2019-03-04 – 2019-03-12 (×5): 3 mL via RESPIRATORY_TRACT
  Filled 2019-03-02: qty 9
  Filled 2019-03-02 (×4): qty 3

## 2019-03-02 MED ORDER — SODIUM CHLORIDE 0.9 % IV SOLN: INTRAVENOUS | Status: AC

## 2019-03-02 NOTE — Progress Notes (Signed)
Occupational Therapy Weekly Progress Note  Patient Details  Name: STRUMMER CANIPE MRN: [id number] Date of Birth: 1950/03/06  Beginning of progress report period: February 24, 2019 End of progress report period: March 02, 2019  Today's Date: 03/02/2019 OT Individual Time: 4481-8563 OT Individual Time Calculation (min): 56 min    Patient has met 1 of 5 long term goals.  Short term goals not set due to estimated length of stay.  Patient continues with progress toward all OT goals, currently limited by endurance and mobility deficits  Patient continues to demonstrate the following deficits: muscle weakness, decreased cardiorespiratoy endurance and decreased oxygen support and decreased standing balance and decreased postural control and therefore will continue to benefit from skilled OT intervention to enhance overall performance with BADL and iADL.  See Patient's Care Plan for progression toward long term goals.  Patient progressing toward long term goals..  Continue plan of care.  Skilled Therapeutic Interventions/Progress Updates:    Patient seated in recliner, reviewed goals for therapy.  Verbal review of HM using rollator.  He is able to verbalize safe methods and will being to practice as he is now walking with rollator during ambulation trails.  Patient is dressing himself daily.  O2 sat at rest 96% on 2L O2 via [place].  Ambulation on in room and on unit with rollator with CS, 4L O2 - requires 1-2 minutes to recover (saturation stays above 87%)   Completed proximal stability and ROM activities with focus on left to increase OH reach and reduce amount of compensation - towel slides on table top, towel slides on wall with min input for scapular position and ER - fatigues quickly.  Completed supine shoulder, tricep and scapular exercise.  Returned to recliner at close of session, call bell and tray table in reach.    Therapy Documentation Precautions:  Precautions Precautions: Fall Precaution  Comments: monitor O2 ( used 6 liters for activity and 3 at rest) Restrictions Weight Bearing Restrictions: No General:   Vital Signs:   Pain: Pain Assessment Pain Scale: 0-10 Pain Score: 0-No pain Faces Pain Scale: Hurts a little bit Pain Type: Acute pain Pain Location: Head Pain Orientation: Right Multiple Pain Sites: No   Other Treatments:     Therapy/Group: Individual Therapy  Carlos Levering 03/02/2019, 12:31 PM

## 2019-03-02 NOTE — Progress Notes (Signed)
Social Work Patient ID: Catalina Lunger, male   DOB: 01/14/1951, 69 y.o.   MRN: [id number]    SW met with pt in room to provide updates from rehab team, and anticipated d/c date 03/11/2019. Pt is grateful for being in program, and happy about the progress he has made. Pt reports that his son or granddaughter will transport at discharge.   Loralee Pacas, MSW, [place] Office: [phone] Cell: [phone] Fax: [phone]

## 2019-03-02 NOTE — Progress Notes (Signed)
[facility] PHYSICAL MEDICINE & REHABILITATION PROGRESS NOTE   Subjective/Complaints:  Pt reports trying to get changed and get freshened up.  WBC basically stable due to being on Prednisone.  Using 2L at rest O2; 4L with walking.    ROS; pt denies CP, abd pain, N/V/C/D, Headache, vision changes- does endorse sore throat and intermittent SOB when moving or talking a lot   Objective:   No results found. Recent Labs    03/01/19 0521 03/02/19 0536  WBC 13.4* 13.9*  HGB 11.4* 12.0*  HCT 36.0* 38.5*  PLT 368 328   Recent Labs    03/01/19 0521 03/02/19 0536  NA 137 136  K 4.3 4.0  CL 98 97*  CO2 29 28  GLUCOSE 106* 98  BUN 28* 31*  CREATININE 1.37* 1.52*  CALCIUM 8.8* 9.1    Intake/Output Summary (Last 24 hours) at 03/02/2019 0932 Last data filed at 03/02/2019 0700 Gross per 24 hour  Intake 780 ml  Output 1050 ml  Net -270 ml     Physical Exam: Vital Signs Blood pressure 138/68, pulse 73, temperature 98.1 F (36.7 C), resp. rate 18, height 5\' 10"  (1.778 m), weight 114.1 kg, SpO2 96 %.   Awake, alert, appropriate, sitting in WC in room; walking around room; on O2, NAD  HENT:  Head: Normocephalic and atraumatic.  Mouth/Throat: Oropharyngeal exudate present.  Tongue slightly coated, but MUCH improved and erythema almost gone at back of throat Eyes: conjugate gaze Neck: No tracheal deviation present.  Cardiovascular: RRR Respiratory: almost completely CTA B/L-2L O2 bu [place] GI: Soft, NT, ND, (+BS Musculoskeletal:        Comments: UEs- deltoid 3-/5 B/L; 5-/5 in arms biceps, triceps, WE, grip, finger abd B/L LEs- HF, KE, KF, DF, PF all 5-/5 B/L  Neurological: He is alert and oriented to person, place, and time. Decreased sensation to light touch in LEs; and LUE to elbow has decreased sensation to light touch.  Intact sensation in RUE.  Skin: Skin is warm and dry.  Bruise on knee  Psychiatric:  Less anxious; brighter         Assessment/Plan: 1.  Functional deficits secondary to debility from 3rd case of pneumonia since COVID 12/20- currently pseudomonas pneumonia which require 3+ hours per day of interdisciplinary therapy in a comprehensive inpatient rehab setting.  Physiatrist is providing close team supervision and 24 hour management of active medical problems listed below.  Physiatrist and rehab team continue to assess barriers to discharge/monitor patient progress toward functional and medical goals  Care Tool:  Bathing  Bathing activity did not occur: Safety/medical concerns(declined this session due to fatigue) Body parts bathed by patient: Right arm, Left arm, Chest, Abdomen, Front perineal area, Buttocks, Right upper leg, Left upper leg, Right lower leg, Left lower leg, Face         Bathing assist Assist Level: Set up assist     Upper Body Dressing/Undressing Upper body dressing   What is the patient wearing?: Pull over shirt    Upper body assist Assist Level: Set up assist    Lower Body Dressing/Undressing Lower body dressing      What is the patient wearing?: Pants, Underwear/pull up     Lower body assist Assist for lower body dressing: Supervision/Verbal cueing     Toileting Toileting    Toileting assist Assist for toileting: Supervision/Verbal cueing Assistive Device Comment: urinal   Transfers Chair/bed transfer  Transfers assist     Chair/bed transfer assist level: Supervision/Verbal cueing  Chair/bed transfer assistive device: Arboriculturist assist      Assist level: Supervision/Verbal cueing Assistive device: Walker-rolling Max distance: 165'   Walk 10 feet activity   Assist     Assist level: Supervision/Verbal cueing Assistive device: Walker-rolling   Walk 50 feet activity   Assist    Assist level: Supervision/Verbal cueing Assistive device: Walker-rolling    Walk 150 feet activity   Assist Walk 150 feet activity did not occur:  Safety/medical concerns  Assist level: Supervision/Verbal cueing Assistive device: Walker-rolling    Walk 10 feet on uneven surface  activity   Assist Walk 10 feet on uneven surfaces activity did not occur: Safety/medical concerns(decreased activity tolerance/balance)         Wheelchair     Assist Will patient use wheelchair at discharge?: No Type of Wheelchair: Manual    Wheelchair assist level: Supervision/Verbal cueing Max wheelchair distance: 150    Wheelchair 50 feet with 2 turns activity    Assist        Assist Level: Supervision/Verbal cueing   Wheelchair 150 feet activity     Assist      Assist Level: Supervision/Verbal cueing   Blood pressure 138/68, pulse 73, temperature 98.1 F (36.7 C), resp. rate 18, height 5\' 10"  (1.778 m), weight 114.1 kg, SpO2 96 %.  1.  Impaired mobility, function, and ADLs secondary to debility from  Pseudomonas pneumonia s/p 3rd recent hospitalization for pneumonia- initial hospital stay for COVID pneumonia.              -patient may shower             -ELOS/Goals: 2- 2.5 weeks; goals mod I  Continue CIR PT and OT, progressing well.  2.  Antithrombotics: -DVT/anticoagulation:  Pharmaceutical: Lovenox             -antiplatelet therapy: N/a 3. Chronic back pain/Pain Management: Hydrocodone prn  -well controlled; not requiring Hydrocodone.   4. H/o depression/Mood: team to provide ego support and help with anxiety.              -antipsychotic agents: N/A 5. Neuropsych: This patient is capable of making decisions on his own behalf. 6. Skin/Wound Care: routine pressure relief measures.  7. Fluids/Electrolytes/Nutrition: Monitor I/O. Check lytes in am.   2/3- lytes and Cr/OK  2/8: I have personally reviewed labs. Creatinine continues to trend upward. 1.37 today. Encourage fluid intake; will place general nursing order. Will repeat BMP tomorrow to trend. If continues to rise, will consider IV fluids.  8. Pseudomonas  PNA: On Cefepime thorough 02/09 9. Post Covid ILD: On prednisone 60 mg/day thorough 3/3 then decrease by 20 mg/month till off. Bactrim DS MWF for PJP prophylaxis. Duonebs tid  2/3- Duonebs made prn - encouraged pt to ask for them when coughing or SOB. 10. OSA: Encourage CPAP use at nights.  11. Pharyngeal mucositis: MMW Qid (was TID) and Nystatin mouth wash qid and trial of nebulized lidocaine added on  2/1. Will also add Diflucan 200 mg x 1 and 100 daily x 6 days.  2/4- added Flonase for sore throat   2/5- improved on exam and by hx  12. CKD Stage IIIa: Monitor BMET with serial checks.   2/3- Cr 1.12 and BUN slightly elevated at 25- a little dry- will encourage fluid intake.   2/6- recheck Monday   2/8: See #7  2/9- reviewed labs- Cr up to 1.52 from 1.39- BUN u to 31; will  give IVFs 75 cc/hr x 12 hours and recheck in AM 13. H/o recurrent GIB/chronic gastritis 05/2018: PPI recommended indefinitely-- will resume with recent use of NSAIDs as well as steroids on board.  14. Chronic diastolic CHF- will monitor daily weights and treat as required.   2/3- will order daily weights   Filed Weights   02/26/19 1700 03/02/19 0623  Weight: 115 kg 114.1 kg    2/6- will follow for trend  2/7- no weights since 2/5- will ask nursing to do.   2/8: No weights; will place general nursing instruction requesting daily weights.   2/9- Weight down to 114.1 kg- will   15. Leukocytosis  2/3- on Prednisone 60 mg daily currently- could be that, however of note, last WBC was 8.8- so unclear- afebrile- will con't to monitor closely for signs of additional infection.   2/4- will recheck labs in AM  2/5- WBC down to 10.6  2/8: Back up to 13.4, asymptomatic. Will repeat tomorrow to trend.   2/9- WBC 13.9- on Prednisone- asymptomatic; will check U/A and Cx just to make sure nothing else- lungs truly CLA B/L  16. Prolonged QT interval  2/5- cannot prescribe Trazodone- will try Melatonin  2/6- had Melatonin 6 mg QHS- pt  said better, but still wakes due to throat  LOS: 7 days A FACE TO FACE EVALUATION WAS PERFORMED  Milli Woolridge 03/02/2019, 9:32 AM

## 2019-03-02 NOTE — Progress Notes (Signed)
Physical Therapy Session Note  Patient Details  Name: Kurt [name] MRN: [id number] Date of Birth: [birth year]  Today's Date: 03/02/2019 PT Individual Time: 0905-1005 PT Individual Time Calculation (min): 60 min   Short Term Goals: Week 1:  PT Short Term Goal 1 (Week 1): STG=LTG due to short ELOS.  Skilled Therapeutic Interventions/Progress Updates:     Patient in recliner in room on 2L/min O2 upon PT arrival. Patient alert and agreeable to PT session. Patient denied pain during session. Focused session on gait training, energy conservation/managment, stair training, and activity tolerance with gait activities. Increased to 4L/min with activity throughout session.   Therapeutic Activity: Transfers: Patient performed sit to/from stand x7 with CGA for safety/balance using a rollator, included several transfers to rollator seat with mod cues for technique. Provided verbal cues for use of hand breaks and turning technique for balance follow PT demonstrated at beginning of session.  Gait Training:  Patient ambulated 165 feet, 124 ft, 135 ft, 145 ft, and 105 ft using a rollator with CGA-close supervision for safety balance.  He also ambulated 40 feet using a SPC with CGA, ambulated with increased gait deviations with cane compared to rollator. Ambulated with decreased gait speed, decreased step length and height, increased lateral lean with B trendelenburg gait L>R, forward trunk lean, and downward head gaze. Focused on erect posture with scapular activation and shoulder protraction/depression, decreased UE support, increased stability in stance with gluteal activation, paced stepping and breathing, and use of modified 10 point RPE scale to manage energy conservation with ambulation. Set goal for reaching 6-7/10 RPE each trial to indicate need for rest break to avoid desaturation.   Patient ascended/descended 8-6" steps using 1 rail and SPC with CGA x2. Performed step-to gait pattern leading with R  while ascending holding L rail and leading with L while descending holding R rail. Provided cues for technique and sequencing. Required decreased cues and demonstrated improved pacing on second trial.   Remained on 4L/min throughout gait and stair training, desaturated to 87% after first (longest) ambulation trial, otherwise remained >91%. Provided cues and education on diaphragmatic and pursed lip breathing during activity and recovery. Noted increased coughing with lower saturation levels, educated patient on stopping when coughing and taking time to recover. Discussed home use of pulse oximeter, patient reports that he has several at home, and demonstrated independent use during session.   Patient in recliner in the room on 2L/min O2 at 96% at end of session with breaks locked and all needs within reach.    Therapy Documentation Precautions:  Precautions Precautions: Fall Precaution Comments: monitor O2 ( used 6 liters for activity and 3 at rest) Restrictions Weight Bearing Restrictions: No    Therapy/Group: Individual Therapy  Harshitha Fretz L Hartlee Amedee PT, DPT  03/02/2019, 1:44 PM

## 2019-03-02 NOTE — Progress Notes (Signed)
Occupational Therapy Session Note  Patient Details  Name: Kurt [name] MRN: [id number] Date of Birth: 28-May-1950  Today's Date: 03/02/2019 OT Individual Time: 1350-1500 OT Individual Time Calculation (min): 70 min    Short Term Goals: Week 2:  OT Short Term Goal 1 (Week 2): STG = LTG  Skilled Therapeutic Interventions/Progress Updates:    Treatment session with focus on activity tolerance, endurance, functional reaching, and functional mobility.  Pt received upright in recliner reporting frustration as he had recently been hooked up to IV fluid.  Pt reports understanding of rationale but frustrated as it impacts his mobility during therapy sessions.  Discussed IADL goals, pt reports having someone who comes to clean his house and she can assist with his laundry and his son is currently taking care of his laundry needs.  Ambulated to toilet with Rollator and CGA due to therapist managing IV pole and portable oxygen.  Pt ambulated 150' with Rollator with oxygen (4L) dropping to 91% and HR 116.  Sats returned to >95% and HR down to 90s with 1 minute rest.  Engaged in dynamic reaching activity to incorporate reaching down towards floor to simulate obtaining items from dresser and lower cabinets at home to also reinforce scapular stability from AM session.  Alternated reaching Rt and Lt to incorporate trunk rotation for increased challenge.  Pt tolerating ~5 mins activity at a time before requiring seated rest break.  Sats remaining 92-93% on 4L with activity.  Increased challenge to include varied heights for reaching.  Returned to room as above and returned to recliner.  Sats again dropped to 91% on 4L and HR elevated post ambulation.  Returned to 2L at rest with sats remaining >95%.    Therapy Documentation Precautions:  Precautions Precautions: Fall Precaution Comments: monitor O2 ( used 6 liters for activity and 3 at rest) Restrictions Weight Bearing Restrictions: No General:   Vital  Signs: Therapy Vitals Temp: 99.5 F (37.5 C) Pulse Rate: 85 Resp: 16 BP: 116/69 Patient Position (if appropriate): Sitting Oxygen Therapy SpO2: 97 % O2 Device: Nasal Cannula O2 Flow Rate (L/min): 2 L/min Pain: Pain Assessment Pain Scale: 0-10 Pain Score: 0-No pain   Therapy/Group: Individual Therapy  Rosalio Loud 03/02/2019, 3:13 PM

## 2019-03-02 NOTE — Patient Care Conference (Signed)
Inpatient RehabilitationTeam Conference and Plan of Care Update Date: 03/02/2019   Time: 11:40 AM    Patient Name: Kurt [name]      Medical Record Number: [id number]  Date of Birth: 06-Aug-1950 Sex: Male         Room/Bed: 4W05C/4W05C-01 Payor Info: Payor: [employer]ICARE / Plan: BCBS MEDICARE / Product Type: *No Product type* /    Admit Date/Time:  02/23/2019  4:46 PM  Primary Diagnosis:  Physical debility  Patient Active Problem List   Diagnosis Date Noted  . Diastolic CHF (HCC) 02/24/2019  . Physical debility 02/23/2019  . COVID-19   . Cavitary pneumonia 02/17/2019  . Multifocal pneumonia 02/16/2019  . Diarrhea 02/16/2019  . Respiratory failure, acute (HCC) 02/16/2019  . Pulmonary nodule   . OSA (obstructive sleep apnea) 01/20/2019  . Acute renal failure superimposed on stage 3a chronic kidney disease (HCC) 01/20/2019  . Chronic sinusitis 01/20/2019  . Acute on chronic respiratory failure (HCC) 01/18/2019  . Failure to thrive in adult 01/18/2019  . Major depression, chronic 01/18/2019  . Obesity, Class III, BMI 40-49.9 (morbid obesity) (HCC) 01/18/2019  . History of COVID-19 12/27/2018  . Acute respiratory failure with hypoxia (HCC) 12/27/2018  . CKD (chronic kidney disease) stage 3, GFR 30-59 ml/min 12/27/2018  . CHF (congestive heart failure) (HCC) 12/27/2018  . QT prolongation 06/15/2018  . 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 GI bleed 04/29/2014  . Acute kidney injury superimposed on CKD (HCC) 04/29/2014  . TIA (transient ischemic attack) 07/08/2012  . Diverticulitis 07/08/2012  . Hypertension 07/08/2012    Expected Discharge Date: Expected Discharge Date: 03/11/19  Team Members Present: Physician leading conference: Dr. Genice Rouge Social Worker Present: Amada Jupiter, LCSW(Auria Lula Olszewski, LCSW) Nurse Present: Doran Durand, LPN Case Manager: Roderic Palau, RN PT Present: Serina Cowper, PT OT Present: Towanda Malkin, OT SLP Present: Suzzette Righter, CF-SLP PPS Coordinator present : Edson Snowball, Park Breed, SLP     Current Status/Progress Goal Weekly Team Focus  Bowel/Bladder   continent of bowel & bladder, LBM 2/7  remain continent  assist as needed & monitor for changes   Swallow/Nutrition/ Hydration             ADL's   CS for self care and funcitonal transfers - 2L O2 needs at rest, 4L during ambulation and exercise in stance  mod I  endurance, balance, core strengthening,   Mobility   CGA-supervision overall, gait 150' on 4L with RW, 8 steps 1 rail CGA o 4L  Mod I overall  Activity tolerance, weaning O2 requirements, balance, strengthening of diaphragm and breathing exercises, functional mobility, patient education   Communication             Safety/Cognition/ Behavioral Observations            Pain   c/o headache, has tylenol & norco prn  pain sclae <3/10  assess & treat as needed   Skin   abrasions, generalized bruising  no skin break down  assess q shift    Rehab Goals Patient on target to meet rehab goals: Yes *See Care Plan and progress notes for long and short-term goals.     Barriers to Discharge  Current Status/Progress Possible Resolutions Date Resolved   Nursing                  PT  Home environment access/layout;Lack of/limited family support;New oxygen  Has 7 STE with 1 rail, needs to be mod I due to only limited PRN assist from family at d/c, current requires 4L/min O2 with activity and 2L/min O2 at rest.              OT                  SLP                SW Decreased caregiver support;Lack of/limited family support Pt will need to be independent at discharge due to limited supports in which his son is only able to check on him in the evenings after work.            Discharge Planning/Teaching Needs:  D/c to home  N/A   Team Discussion: WBC 13.9, on prednisone, ordered UA/culture, on  fluid restrictions, IV fluids ordered.  RN cont B/B, headache, given tylenol.  OT S/mod I ADLs, working on endurance, L shoulder weakness.  PT CGA/S transfers, gait RW to rollator 150', on O2 4L with activity, pacing himself, stairs 7 into home, is CGA for 8 steps with cane, going home alone, goals are mod I.   Revisions to Treatment Plan: N/A     Medical Summary Current Status: c/o a headache- giving tylernol; LBM 2/7; continent; skin good; On O2 by [place] 2-4L Weekly Focus/Goal: Supervision to Mod I; L shoulder is VERY weak- focusing on that and endurance  Barriers to Discharge: New oxygen;Home enviroment access/layout;Decreased family/caregiver support;Medical stability  Barriers to Discharge Comments: WBC 13.9k; Cr 1.52- will give IVFs; plans to go back to work immediately when d/c'd Possible Resolutions to Barriers: PT-150 ft with rollator; 2L at rest; 4L with gait; up/down steps (needs 7 STE to get into home); rail and cane was CGA with 8 steps today. home alone needed   Continued Need for Acute Rehabilitation Level of Care: The patient requires daily medical management by a physician with specialized training in physical medicine and rehabilitation for the following reasons: Direction of a multidisciplinary physical rehabilitation program to maximize functional independence : Yes Medical management of patient stability for increased activity during participation in an intensive rehabilitation regime.: Yes Analysis of laboratory values and/or radiology reports with any subsequent need for medication adjustment and/or medical intervention. : Yes   I attest that I was present, lead the team conference, and concur with the assessment and plan of the team.   Jodell Cipro M 03/02/2019, 8:05 PM   Team conference was held via web/ teleconference due to [place] - 19

## 2019-03-03 ENCOUNTER — Inpatient Hospital Stay (HOSPITAL_COMMUNITY): Payer: Medicare Other

## 2019-03-03 ENCOUNTER — Inpatient Hospital Stay (HOSPITAL_COMMUNITY): Payer: Medicare Other | Admitting: Occupational Therapy

## 2019-03-03 LAB — BASIC METABOLIC PANEL
Anion gap: 11 (ref 5–15)
BUN: 32 mg/dL — ABNORMAL HIGH (ref 8–23)
CO2: 28 mmol/L (ref 22–32)
Calcium: 9 mg/dL (ref 8.9–10.3)
Chloride: 98 mmol/L (ref 98–111)
Creatinine, Ser: 1.34 mg/dL — ABNORMAL HIGH (ref 0.61–1.24)
GFR calc Af Amer: >60 mL/min (ref >60)
GFR calc non Af Amer: 53 mL/min — ABNORMAL LOW (ref >60)
Glucose, Bld: 98 mg/dL (ref 70–99)
Potassium: 4.3 mmol/L (ref 3.5–5.1)
Sodium: 137 mmol/L (ref 135–145)

## 2019-03-03 LAB — CBC WITH DIFFERENTIAL/PLATELET
Abs Immature Granulocytes: 0.16 10*3/uL — ABNORMAL HIGH (ref 0.00–0.07)
Basophils Absolute: 0 10*3/uL (ref 0.0–0.1)
Basophils Relative: 0 %
Eosinophils Absolute: 0 10*3/uL (ref 0.0–0.5)
Eosinophils Relative: 0 %
HCT: 38.2 % — ABNORMAL LOW (ref 39.0–52.0)
Hemoglobin: 12.1 g/dL — ABNORMAL LOW (ref 13.0–17.0)
Immature Granulocytes: 1 %
Lymphocytes Relative: 10 %
Lymphs Abs: 1.4 10*3/uL (ref 0.7–4.0)
MCH: 27.5 pg (ref 26.0–34.0)
MCHC: 31.7 g/dL (ref 30.0–36.0)
MCV: 86.8 fL (ref 80.0–100.0)
Monocytes Absolute: 0.8 10*3/uL (ref 0.1–1.0)
Monocytes Relative: 6 %
Neutro Abs: 11.1 10*3/uL — ABNORMAL HIGH (ref 1.7–7.7)
Neutrophils Relative %: 83 %
Platelets: 333 10*3/uL (ref 150–400)
RBC: 4.4 MIL/uL (ref 4.22–5.81)
RDW: 15.2 % (ref 11.5–15.5)
WBC: 13.5 10*3/uL — ABNORMAL HIGH (ref 4.0–10.5)
nRBC: 0 % (ref 0.0–0.2)

## 2019-03-03 LAB — URINE CULTURE: Culture: NO GROWTH

## 2019-03-03 NOTE — Progress Notes (Signed)
Physical Therapy Weekly Progress Note  Patient Details  Name: Kurt [name] MRN: [id number] Date of Birth: 03/21/1950  Beginning of progress report period: February 24, 2019 End of progress report period: March 03, 2019  Today's Date: 03/03/2019 PT Individual Time: 0800-0915 PT Individual Time Calculation (min): 75 min   Patient is progressing well towards term goals.  Extended ELOS due to patient's need to be independent at home due to limited family/caregiver support. He does report that his son will come over in the evenings when he needs to shower, however, he plans to be independent and return to part time work at d/c. He currently requires CGA-supervision overall, gait 150' using a rollator and 8 steps using a SPC and 1 rail on 4L with O2 sats >90%. Continuing to focus on weaning O2 requirements with activity and improve balance and activity tolerance to progress towards independence at home.    Patient continues to demonstrate the following deficits muscle weakness and muscle joint tightness, decreased cardiorespiratoy endurance and decreased oxygen support, decreased sensation due to peripheral neuropathy and decreased standing balance, decreased postural control and decreased balance strategies and therefore will continue to benefit from skilled PT intervention to increase functional independence with mobility.  Patient progressing toward long term goals..  Continue plan of care.  PT Short Term Goals Week 1:  PT Short Term Goal 1 (Week 1): STG=LTG due to short ELOS. PT Short Term Goal 1 - Progress (Week 1): Progressing toward goal Week 2:  PT Short Term Goal 1 (Week 2): STG=LTG due to ELOS.  Skilled Therapeutic Interventions/Progress Updates:     Patient in recliner in room upon PT arrival. Patient alert and agreeable to PT session. Patient denied pain during session. He was on 2L/min O2 upon PT arrival, SPO2 99%. Titrated to 1L/min O2 and patient remained >97% in sitting at rest  with normal RR while having a conversation about C-pap use and upcoming sleep study upon d/c. Removed O2 and patient remained between 94-97% on RA in sitting at rest x4 min while continue to discuss O2 monitoring and different healthy nutrition options, as he reported he is tired of the same thing every day. Dr. Dagoberto Ligas rounded during this time and was informed about his ability to maintain O2 saturation >94% on RA.   Therapeutic Activity: Transfers: Patient performed sit to/from stand x1 from the recliner on RA, remained standing x2 min with supervision without and AD, desaturated to 88% at 2 min, recovered to >93% in sitting on RA with pursed lip breathing in 1 min. Placed patient on 4L O2 for mobility after. He performed sit to/from stand x8 from the rollator, demonstrated safe use of breaks throughout with CGA-close supervision. Provided verbal cues for decreased speed when turning to hold onto the rollator for safety. He performed a toilet transfer with use of a grab bar and SPC with supervision and was independent with peri-care and LB dressing during toileting.   Gait Training:  Patient ambulated 110 feet x2 and 120 feet x2 using a rollator with CGA-close supervision. Ambulated with B shoulder elevation, mild forward trunk lean, mild trendelenburg gait, and improved pace with paced breathing today. Provided verbal cues for shoulder depression and retraction for erect posture and increased gluteal activation in stance. Patient checked his O2 saturation using a pulse oximeter after each trial. SPO2 between 88-91% after each trial on 4L/min, recovered to >95% in ~1 min in sitting. Patient performed diaphragmatic breathing during rest breaks with min cues to supervision  throughout session. Reported that he initiated stopping to rest at 6-7/10 on the modified RPE scale each trial. Patient also ambulated ~15 feet to/from the bathroom using a SPC with CGA for safety/balance. Ambulated with increased lateral  sway with SPC. Provided cues for sequencing initially. Patient ascended/descended 1-7" step using SPC with min A-CGA. Performed step-to gait pattern leading with R while ascending and L while descending. Provided cues for technique and sequencing. Patient expressed apprehension during activity with only the [facility], however, was pleased with his performance after. SPO2 92% after on 4L/min.   Patient in recliner in room at end of session with breaks locked and all needs within reach.    Therapy Documentation Precautions:  Precautions Precautions: Fall Precaution Comments: monitor O2 ( used 6 liters for activity and 3 at rest) Restrictions Weight Bearing Restrictions: No   Therapy/Group: Individual Therapy  Yidel Teuscher L Lakeyshia Tuckerman PT, DPT  03/03/2019, 12:19 PM

## 2019-03-03 NOTE — Plan of Care (Signed)
  Problem: RH BOWEL ELIMINATION Goal: RH STG MANAGE BOWEL WITH ASSISTANCE Description: STG Manage Bowel with  Min Assistance. Outcome: Progressing Goal: RH STG MANAGE BOWEL W/MEDICATION W/ASSISTANCE Description: STG Manage Bowel with Medication with Min  Assistance. Outcome: Progressing   Problem: RH BLADDER ELIMINATION Goal: RH STG MANAGE BLADDER WITH ASSISTANCE Description: STG Manage Bladder With Min Assistance Outcome: Progressing   Problem: RH SKIN INTEGRITY Goal: RH STG SKIN FREE OF INFECTION/BREAKDOWN Outcome: Progressing Goal: RH STG MAINTAIN SKIN INTEGRITY WITH ASSISTANCE Description: STG Maintain Skin Integrity With Min Assistance. Outcome: Progressing   Problem: RH SAFETY Goal: RH STG ADHERE TO SAFETY PRECAUTIONS W/ASSISTANCE/DEVICE Description: STG Adhere to Safety Precautions With Min  Assistance/Device. Outcome: Progressing Goal: RH STG DECREASED RISK OF FALL WITH ASSISTANCE Description: STG Decreased Risk of Fall With Min Assistance. Outcome: Progressing   Problem: RH PAIN MANAGEMENT Goal: RH STG PAIN MANAGED AT OR BELOW PT'S PAIN GOAL Description: Pt will be free of pain Outcome: Progressing   Problem: RH KNOWLEDGE DEFICIT GENERAL Goal: RH STG INCREASE KNOWLEDGE OF SELF CARE AFTER HOSPITALIZATION Outcome: Progressing   Problem: Consults Goal: RH GENERAL PATIENT EDUCATION Description: See Patient Education module for education specifics. Outcome: Progressing   

## 2019-03-03 NOTE — Progress Notes (Signed)
Occupational Therapy Session Note  Patient Details  Name: Kurt [name] MRN: [id number] Date of Birth: 25-Nov-1950  Today's Date: 03/03/2019 OT Individual Time: 1000-1045 OT Individual Time Calculation (min): 45 min    Short Term Goals: Week 2:  OT Short Term Goal 1 (Week 2): STG = LTG  Skilled Therapeutic Interventions/Progress Updates:    Treatment session with focus on activity tolerance and functional mobility during self-care tasks.  Pt received on 1L O2 at rest reporting "hot flashes" over night and requesting to shower this session.  Pt ambulated to room shower with RW to allow for increased mobility in room and tight bathroom.  Pt completed toilet transfer and shower transfer with supervision.  Pt completed bathing with setup assist for items and then intermittent supervision to assure safety.  Pt on 3L O2 throughout shower, sats at 93% post shower.  Completed dressing at sit > stand after setup assist for items.  Oral care completed in standing at sink for increased activity tolerance.  Pt returned to recliner and 1L O2 with sats 93-96%.  Discussed progress towards goals and pt pleased with progress to this point.  Therapy Documentation Precautions:  Precautions Precautions: Fall Precaution Comments: monitor O2 ( used 4 liters for activity and 1 at rest) Restrictions Weight Bearing Restrictions: No Pain:  Pt with no c/o pain   Therapy/Group: Individual Therapy  Rosalio Loud 03/03/2019, 12:17 PM

## 2019-03-03 NOTE — Progress Notes (Signed)
[place] PHYSICAL MEDICINE & REHABILITATION PROGRESS NOTE   Subjective/Complaints:  Pt was satting 95% on RA this AM at rest- still requiring 2-4L while moving, but much imrpoved O2 sats.    U/A (-)- no Sx's of infection, per pt- no cough, no dysuria, etc.  Finished IVFs yesterday- no complications.  Is trying to drink a little more.    ROS; pt denies CP, abd pain, N/V/C/D, Headache, vision changes- does endorse sore throat which is better and intermittent SOB when moving around   Objective:   No results found. Recent Labs    03/02/19 0536 03/03/19 0534  WBC 13.9* 13.5*  HGB 12.0* 12.1*  HCT 38.5* 38.2*  PLT 328 333   Recent Labs    03/02/19 0536 03/03/19 0534  NA 136 137  K 4.0 4.3  CL 97* 98  CO2 28 28  GLUCOSE 98 98  BUN 31* 32*  CREATININE 1.52* 1.34*  CALCIUM 9.1 9.0    Intake/Output Summary (Last 24 hours) at 03/03/2019 1457 Last data filed at 03/03/2019 0550 Gross per 24 hour  Intake 240 ml  Output 900 ml  Net -660 ml     Physical Exam: Vital Signs Blood pressure 121/75, pulse 86, temperature 98.4 F (36.9 C), temperature source Oral, resp. rate 20, height 5\' 10"  (1.778 m), weight 115.4 kg, SpO2 97 %.   Awake, alert, appropriate, sitting in bedside chair in room; PT in room; NOT ON O2, NAD HENT:  Head: Normocephalic and atraumatic.  Mouth/Throat: mild erythema back of oropharynx Eyes: conjugate gaze Neck: No tracheal deviation present.  Cardiovascular: RRR, no M.R.G Respiratory: CTA B/L  Completely- no on O2 at REST GI: Soft, NT, ND, (+BS Musculoskeletal:        Comments: UEs- deltoid 3-/5 B/L; 5-/5 in arms biceps, triceps, WE, grip, finger abd B/L LEs- HF, KE, KF, DF, PF all 5-/5 B/L  Neurological: He is alert and oriented to person, place, and time. Decreased sensation to light touch in LEs; and LUE to elbow has decreased sensation to light touch.  Intact sensation in RUE.  Skin: Skin is warm and dry.  Bruise on knee  Psychiatric:   excited         Assessment/Plan: 1. Functional deficits secondary to debility from 3rd case of pneumonia since COVID 12/20- currently pseudomonas pneumonia which require 3+ hours per day of interdisciplinary therapy in a comprehensive inpatient rehab setting.  Physiatrist is providing close team supervision and 24 hour management of active medical problems listed below.  Physiatrist and rehab team continue to assess barriers to discharge/monitor patient progress toward functional and medical goals  Care Tool:  Bathing  Bathing activity did not occur: Safety/medical concerns(declined this session due to fatigue) Body parts bathed by patient: Right arm, Left arm, Chest, Abdomen, Front perineal area, Buttocks, Right upper leg, Left upper leg, Right lower leg, Left lower leg, Face         Bathing assist Assist Level: Set up assist     Upper Body Dressing/Undressing Upper body dressing   What is the patient wearing?: Pull over shirt    Upper body assist Assist Level: Set up assist    Lower Body Dressing/Undressing Lower body dressing      What is the patient wearing?: Pants, Underwear/pull up     Lower body assist Assist for lower body dressing: Set up assist     Toileting Toileting    Toileting assist Assist for toileting: Supervision/Verbal cueing Assistive Device Comment: urinal  Transfers Chair/bed transfer  Transfers assist     Chair/bed transfer assist level: Supervision/Verbal cueing Chair/bed transfer assistive device: Arboriculturist assist      Assist level: Contact Guard/Touching assist Assistive device: Rollator Max distance: 120'   Walk 10 feet activity   Assist     Assist level: Contact Guard/Touching assist Assistive device: Cane-straight   Walk 50 feet activity   Assist    Assist level: Contact Guard/Touching assist Assistive device: Rollator    Walk 150 feet activity   Assist Walk 150  feet activity did not occur: Safety/medical concerns  Assist level: Contact Guard/Touching assist Assistive device: Rollator    Walk 10 feet on uneven surface  activity   Assist Walk 10 feet on uneven surfaces activity did not occur: Safety/medical concerns(decreased activity tolerance/balance)         Wheelchair     Assist Will patient use wheelchair at discharge?: No Type of Wheelchair: Manual    Wheelchair assist level: Supervision/Verbal cueing Max wheelchair distance: 150    Wheelchair 50 feet with 2 turns activity    Assist        Assist Level: Supervision/Verbal cueing   Wheelchair 150 feet activity     Assist      Assist Level: Supervision/Verbal cueing   Blood pressure 121/75, pulse 86, temperature 98.4 F (36.9 C), temperature source Oral, resp. rate 20, height 5\' 10"  (1.778 m), weight 115.4 kg, SpO2 97 %.  1.  Impaired mobility, function, and ADLs secondary to debility from  Pseudomonas pneumonia s/p 3rd recent hospitalization for pneumonia- initial hospital stay for COVID pneumonia.              -patient may shower             -ELOS/Goals: 2- 2.5 weeks; goals mod I  Continue CIR PT and OT, progressing well.  2.  Antithrombotics: -DVT/anticoagulation:  Pharmaceutical: Lovenox             -antiplatelet therapy: N/a 3. Chronic back pain/Pain Management: Hydrocodone prn  -well controlled; not requiring Hydrocodone.   4. H/o depression/Mood: team to provide ego support and help with anxiety.              -antipsychotic agents: N/A 5. Neuropsych: This patient is capable of making decisions on his own behalf. 6. Skin/Wound Care: routine pressure relief measures.  7. Fluids/Electrolytes/Nutrition: Monitor I/O. Check lytes in am.   2/3- lytes and Cr/OK  2/8: I have personally reviewed labs. Creatinine continues to trend upward. 1.37 today. Encourage fluid intake; will place general nursing order. Will repeat BMP tomorrow to trend. If continues  to rise, will consider IV fluids.   2/10- gave IVFs 2/9- Cr dropped from 1.52 to 1.34 but BUN still 32- will allow PO intake and recheck Friday since sitl elevated.  8. 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 Bactrim DS MWF for PJP prophylaxis. Duonebs tid  2/3- Duonebs made prn - encouraged pt to ask for them when coughing or SOB.  2/10- con't Bactrim for prophylaxis will check with ID when to stop bactrim.  10. OSA: Encourage CPAP use at nights.  11. Pharyngeal mucositis: MMW Qid (was TID) and Nystatin mouth wash qid and trial of nebulized lidocaine added on  2/1. Will also add Diflucan 200 mg x 1 and 100 daily x 6 days.  2/4- added Flonase for  sore throat   2/5- improved on exam and by hx  12. CKD Stage IIIa: Monitor BMET with serial checks.   2/3- Cr 1.12 and BUN slightly elevated at 25- a little dry- will encourage fluid intake.   2/6- recheck Monday   2/8: See #7  2/9- reviewed labs- Cr up to 1.52 from 1.39- BUN u to 31; will give IVFs 75 cc/hr x 12 hours and recheck in AM  2/10- gave IVFs 2/9- Cr dropped from 1.52 to 1.34 but BUN still 32- will allow PO intake and recheck Friday since sitl elevated.  41. H/o recurrent GIB/chronic gastritis 05/2018: PPI recommended indefinitely-- will resume with recent use of NSAIDs as well as steroids on board.  14. Chronic diastolic CHF- will monitor daily weights and treat as required.   2/3- will order daily weights   Filed Weights   02/26/19 1700 03/02/19 0623 03/03/19 0500  Weight: 115 kg 114.1 kg 115.4 kg    2/6- will follow for trend  2/7- no weights since 2/5- will ask nursing to do.   2/8: No weights; will place general nursing instruction requesting daily weights.   2/9- Weight down to 114.1 kg- will monitor  2/10- Weight back up 1 kg- expected since received IVFs- 115 kg- will monitor to make sure doesn't go too high   15.  Leukocytosis  2/3- on Prednisone 60 mg daily currently- could be that, however of note, last WBC was 8.8- so unclear- afebrile- will con't to monitor closely for signs of additional infection.   2/4- will recheck labs in AM  2/5- WBC down to 10.6  2/8: Back up to 13.4, asymptomatic. Will repeat tomorrow to trend.   2/9- WBC 13.9- on Prednisone- asymptomatic; will check U/A and Cx just to make sure nothing else- lungs truly CLA B/L   2/10- U/A (-)- WBC down to 13.4- afebrile- no signs or Sx's of infection.  16. Prolonged QT interval  2/5- cannot prescribe Trazodone- will try Melatonin  2/6- had Melatonin 6 mg QHS- pt said better, but still wakes due to throat  2/10- sleeping better- con't regimen.   LOS: 8 days A FACE TO FACE EVALUATION WAS PERFORMED  Reannon Candella 03/03/2019, 2:57 PM

## 2019-03-03 NOTE — Progress Notes (Signed)
Occupational Therapy Session Note  Patient Details  Name: Kurt [name] MRN: [id number] Date of Birth: Dec 26, 1950  Today's Date: 03/03/2019 OT Individual Time: 1300-1415 OT Individual Time Calculation (min): 75 min    Short Term Goals: Week 2:  OT Short Term Goal 1 (Week 2): STG = LTG  Skilled Therapeutic Interventions/Progress Updates:    pt received sitting up in the recliner on 1L [place], SpO2 92%. Pt donned shoes with set up assist seated. Pt increased to 3L [place] and completed 75 ft to functional mobility with rollator, decreasing to 89% and requiring increase to 4L [place] to rebound > 95%. Pt completed 200 ft of functional mobility with close (S), requiring 3x rest breaks. Pt sat EOM and completed 3x sit <> stands with 2 # dumbbell with forward chest press, requiring seated rest break between each set. Pt completed functional stepping in standing with rollator for UE support, 3x 30 repetitions for functional activity tolerance. Spo2 monitored throughout session with intermittent desaturations to 90%, pt self-identifying need for rest breaks and accurately guessing SpO2 level. Pt completed toileting tasks, voiding Bm (self report) with (S) overall. Pt returned to room in similar fashion, requiring rest breaks every 50-75 ft. Pt was left sitting up in the recliner with all needs met.   Therapy Documentation Precautions:  Precautions Precautions: Fall Precaution Comments: monitor O2 ( used 6 liters for activity and 3 at rest) Restrictions Weight Bearing Restrictions: No  Therapy/Group: Individual Therapy  Curtis Sites 03/03/2019, 1:30 PM

## 2019-03-04 ENCOUNTER — Inpatient Hospital Stay (HOSPITAL_COMMUNITY): Payer: Medicare Other

## 2019-03-04 ENCOUNTER — Other Ambulatory Visit (HOSPITAL_COMMUNITY): Payer: Medicare Other

## 2019-03-04 ENCOUNTER — Inpatient Hospital Stay (HOSPITAL_COMMUNITY): Payer: Medicare Other | Admitting: Occupational Therapy

## 2019-03-04 MED ORDER — ALBUTEROL SULFATE (2.5 MG/3ML) 0.083% IN NEBU: 2.5000 mg | INHALATION_SOLUTION | RESPIRATORY_TRACT | Status: DC | PRN

## 2019-03-04 MED ORDER — ALBUTEROL SULFATE (2.5 MG/3ML) 0.083% IN NEBU: 3.0000 mL | INHALATION_SOLUTION | RESPIRATORY_TRACT | Status: DC | PRN

## 2019-03-04 NOTE — Progress Notes (Signed)
[place] PHYSICAL MEDICINE & REHABILITATION PROGRESS NOTE   Subjective/Complaints:   Pt walked to bathroom with 2L- sats dropped to 83%.  97% at 1L- at rest-  Coughs at rest on RA- cannot tolerate at Rest unless doesn't move at all.   Needs at least 3L when moving still it appears.   Pt reports doing well otherwise. D/c next week.   Still having hot flashes - intermittently- having 1 now.   ROS; pt denies CP, abd pain, N/V/C/D, Headache, vision changes- does endorse sore throat which is better and intermittent SOB when moving around   Objective:   No results found. Recent Labs    03/02/19 0536 03/03/19 0534  WBC 13.9* 13.5*  HGB 12.0* 12.1*  HCT 38.5* 38.2*  PLT 328 333   Recent Labs    03/02/19 0536 03/03/19 0534  NA 136 137  K 4.0 4.3  CL 97* 98  CO2 28 28  GLUCOSE 98 98  BUN 31* 32*  CREATININE 1.52* 1.34*  CALCIUM 9.1 9.0    Intake/Output Summary (Last 24 hours) at 03/04/2019 0926 Last data filed at 03/04/2019 9150 Gross per 24 hour  Intake 510 ml  Output 1925 ml  Net -1415 ml     Physical Exam: Vital Signs Blood pressure 114/72, pulse 92, temperature 98.5 F (36.9 C), temperature source Oral, resp. rate 18, height 5\' 10"  (1.778 m), weight 112.2 kg, SpO2 97 %.   Awake, alert, appropriate, sitting on EOB in room; OT in room; Pt just walked back to bed from bathroom; on 2-3L O2-, NAD HENT:  Head: Normocephalic and atraumatic.  Mouth/Throat: mild erythema back of oropharynx Eyes: conjugate gaze Neck: No tracheal deviation present.  Cardiovascular: RRR no M/R/G Respiratory: CTA B/L  Completely clear, on 2-3L currently due ot walking back from bathroom.  GI: Soft, NT, ND, (+BS Musculoskeletal:        Comments: UEs- deltoid 3-/5 B/L; 5-/5 in arms biceps, triceps, WE, grip, finger abd B/L LEs- HF, KE, KF, DF, PF all 5-/5 B/L  Neurological: He is alert and oriented to person, place, and time. Decreased sensation to light touch in LEs; and LUE to  elbow has decreased sensation to light touch.  Intact sensation in RUE.  Skin: Skin is warm and dry.  Bruise on knee  Psychiatric:  excited         Assessment/Plan: 1. Functional deficits secondary to debility from 3rd case of pneumonia since COVID 12/20- currently pseudomonas pneumonia which require 3+ hours per day of interdisciplinary therapy in a comprehensive inpatient rehab setting.  Physiatrist is providing close team supervision and 24 hour management of active medical problems listed below.  Physiatrist and rehab team continue to assess barriers to discharge/monitor patient progress toward functional and medical goals  Care Tool:  Bathing  Bathing activity did not occur: Safety/medical concerns(declined this session due to fatigue) Body parts bathed by patient: Right arm, Left arm, Chest, Abdomen, Front perineal area, Buttocks, Right upper leg, Left upper leg, Right lower leg, Left lower leg, Face         Bathing assist Assist Level: Set up assist     Upper Body Dressing/Undressing Upper body dressing   What is the patient wearing?: Pull over shirt    Upper body assist Assist Level: Set up assist    Lower Body Dressing/Undressing Lower body dressing      What is the patient wearing?: Pants, Underwear/pull up     Lower body assist Assist for lower body  dressing: Set up assist     Toileting Toileting    Toileting assist Assist for toileting: Supervision/Verbal cueing Assistive Device Comment: urinal   Transfers Chair/bed transfer  Transfers assist     Chair/bed transfer assist level: Supervision/Verbal cueing Chair/bed transfer assistive device: Geologist, engineering   Ambulation assist      Assist level: Contact Guard/Touching assist Assistive device: Rollator Max distance: 120'   Walk 10 feet activity   Assist     Assist level: Contact Guard/Touching assist Assistive device: Cane-straight   Walk 50 feet  activity   Assist    Assist level: Contact Guard/Touching assist Assistive device: Rollator    Walk 150 feet activity   Assist Walk 150 feet activity did not occur: Safety/medical concerns  Assist level: Contact Guard/Touching assist Assistive device: Rollator    Walk 10 feet on uneven surface  activity   Assist Walk 10 feet on uneven surfaces activity did not occur: Safety/medical concerns(decreased activity tolerance/balance)         Wheelchair     Assist Will patient use wheelchair at discharge?: No Type of Wheelchair: Manual    Wheelchair assist level: Supervision/Verbal cueing Max wheelchair distance: 150    Wheelchair 50 feet with 2 turns activity    Assist        Assist Level: Supervision/Verbal cueing   Wheelchair 150 feet activity     Assist      Assist Level: Supervision/Verbal cueing   Blood pressure 114/72, pulse 92, temperature 98.5 F (36.9 C), temperature source Oral, resp. rate 18, height 5\' 10"  (1.778 m), weight 112.2 kg, SpO2 97 %.  1.  Impaired mobility, function, and ADLs secondary to debility from  Pseudomonas pneumonia s/p 3rd recent hospitalization for pneumonia- initial hospital stay for COVID pneumonia.   2/11- back down on O2- down to 2-3L overall- used to be on 4-6L. Will con't to help.              -patient may shower             -ELOS/Goals: 2- 2.5 weeks; goals mod I  Continue CIR PT and OT, progressing well.  2.  Antithrombotics: -DVT/anticoagulation:  Pharmaceutical: Lovenox             -antiplatelet therapy: N/a 3. Chronic back pain/Pain Management: Hydrocodone prn  -well controlled; not requiring Hydrocodone.   4. H/o depression/Mood: team to provide ego support and help with anxiety.              -antipsychotic agents: N/A 5. Neuropsych: This patient is capable of making decisions on his own behalf. 6. Skin/Wound Care: routine pressure relief measures.  7. Fluids/Electrolytes/Nutrition: Monitor I/O.  Check lytes in am.   2/3- lytes and Cr/OK  2/8: I have personally reviewed labs. Creatinine continues to trend upward. 1.37 today. Encourage fluid intake; will place general nursing order. Will repeat BMP tomorrow to trend. If continues to rise, will consider IV fluids.   2/10- gave IVFs 2/9- Cr dropped from 1.52 to 1.34 but BUN still 32- will allow PO intake and recheck Friday since sitl elevated.  8. 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 Bactrim DS MWF for PJP prophylaxis. Duonebs tid  2/3- Duonebs made prn - encouraged pt to ask for them when coughing or SOB.  2/10- con't Bactrim for prophylaxis will check with ID when to stop bactrim.  10. OSA:  Encourage CPAP use at nights.  11. Pharyngeal mucositis: MMW Qid (was TID) and Nystatin mouth wash qid and trial of nebulized lidocaine added on  2/1. Will also add Diflucan 200 mg x 1 and 100 daily x 6 days.  2/4- added Flonase for sore throat   2/5- improved on exam and by hx  12. CKD Stage IIIa: Monitor BMET with serial checks.   2/3- Cr 1.12 and BUN slightly elevated at 25- a little dry- will encourage fluid intake.   2/6- recheck Monday   2/8: See #7  2/9- reviewed labs- Cr up to 1.52 from 1.39- BUN u to 31; will give IVFs 75 cc/hr x 12 hours and recheck in AM  2/10- gave IVFs 2/9- Cr dropped from 1.52 to 1.34 but BUN still 32- will allow PO intake and recheck Friday since sitl elevated.  51. H/o recurrent GIB/chronic gastritis 05/2018: PPI recommended indefinitely-- will resume with recent use of NSAIDs as well as steroids on board.  14. Chronic diastolic CHF- will monitor daily weights and treat as required.   2/3- will order daily weights   Filed Weights   03/02/19 0623 03/03/19 0500 03/04/19 0700  Weight: 114.1 kg 115.4 kg 112.2 kg    2/6- will follow for trend  2/7- no weights since 2/5- will ask nursing to do.   2/8: No weights; will  place general nursing instruction requesting daily weights.   2/9- Weight down to 114.1 kg- will monitor  2/10- Weight back up 1 kg- expected since received IVFs- 115 kg- will monitor to make sure doesn't go too high  2/11- 112 kg- down 3 kg- doesn't make sense   15. Leukocytosis  2/3- on Prednisone 60 mg daily currently- could be that, however of note, last WBC was 8.8- so unclear- afebrile- will con't to monitor closely for signs of additional infection.   2/4- will recheck labs in AM  2/5- WBC down to 10.6  2/8: Back up to 13.4, asymptomatic. Will repeat tomorrow to trend.   2/9- WBC 13.9- on Prednisone- asymptomatic; will check U/A and Cx just to make sure nothing else- lungs truly CLA B/L   2/10- U/A (-)- WBC down to 13.4- afebrile- no signs or Sx's of infection.   2/11- pt having hot flashes- explained will have hot flashes while on Prednisone, it's common.  16. Prolonged QT interval  2/5- cannot prescribe Trazodone- will try Melatonin  2/6- had Melatonin 6 mg QHS- pt said better, but still wakes due to throat  2/10- sleeping better- con't regimen.   LOS: 9 days A FACE TO FACE EVALUATION WAS PERFORMED  Alexa Blish 03/04/2019, 9:26 AM

## 2019-03-04 NOTE — Progress Notes (Signed)
Occupational Therapy Session Note  Patient Details  Name: Kurt [name] MRN: [id number] Date of Birth: 04/16/1950  Today's Date: 03/04/2019 OT Individual Time: 1062-6948 OT Individual Time Calculation (min): 60 min    Short Term Goals: Week 2:  OT Short Term Goal 1 (Week 2): STG = LTG  Skilled Therapeutic Interventions/Progress Updates:    Patient seated in recliner, O2 sat 96% on 1L, completed short distance ambulation with room with rollator to/from toilet and standing at sink to wash hands - all with CS on 2L O2 - saturation to 85%, recovered to 91% within one minute.  Completed supine shoulder and scapular stability and mobility activities, repeated at 25 degrees and again at 45 degrees - moderate difficulty when at 45.  Completed seated shoulder isometrics (ER/IR, flex/ext, add/abd) - cues for DBT during this activity - saturation remained in the 90's on 2L.  Completed standing balance activity on 4L and ambulation in room without AD (sat at 87% upon sitting)  Patient remained seated in recliner at close of session on 1L O2, has call bell and other needs within reach.   Therapy Documentation Precautions:  Precautions Precautions: Fall Precaution Comments: monitor O2 ( used 6 liters for activity and 3 at rest) Restrictions Weight Bearing Restrictions: No General:   Vital Signs:   Pain: Pain Assessment Pain Scale: 0-10 Pain Score: 0-No pain Other Treatments:     Therapy/Group: Individual Therapy  Barrie Lyme 03/04/2019, 8:54 AM

## 2019-03-04 NOTE — Progress Notes (Signed)
Physical Therapy Session Note  Patient Details  Name: Kurt [name] MRN: [id number] Date of Birth: [birth year]  Today's Date: 03/04/2019 PT Individual Time: 1002-1058 PT Individual Time Calculation (min): 56 min   Short Term Goals: Week 2:  PT Short Term Goal 1 (Week 2): STG=LTG due to ELOS.  Skilled Therapeutic Interventions/Progress Updates:     Patient in recliner in room upon PT arrival. Patient alert and agreeable to PT session. Patient denied pain during session. Reported increased non-productive coughing this morning and asking about why he is not receiving breathing treatments. Per Pam, PA, without a productive cough he no longer needs the breathing treatments and should use the incentive spirometer and the flutter valve. Flutter valve in the room was on the floor and broken, obtained a new flutter valve during session.   Focused session on re-assessment of outcome measures performed at evaluation at 1 week since admission. Educated patient on purpose of tests and results throughout session.   Therapeutic Activity: Transfers: Patient performed sit to/from stand x11 with supervision without an AD. Provided cues for forward weight shift x2.  Gait Training:  Patient ambulated 165 feet x2 using a rollator with CGA-close supervision. Ambulated with decreased gait speed, decreased step length and height, B trendelenburg gait L>R, and mild forward trunk lean. Provided verbal cues for erect posture, increased gluteal activation in stance, paced breathing, and increased step height. 6 Min Walk Test:  Patient performed a 6 Minute Walk Test during his fist ambulation trials. Instructed patient to ambulate as quickling and as safely as possible for 6 minutes using LRAD. Patient was allowed to take standing rest breaks without stopping the test, but if he required a sitting rest break the clock would be stopped and the test would be over.  Results: 165 feet using a rollator on 4L/min O2, stopped  test at 3 min and 24 sec due to sitting rest break. SPO2 89% and RPE 7/10 at end of test. Patient recovered to 95% in 1.5 min with pursed lip breathing on 4L/min.    Neuromuscular Re-ed: Patient performed the following balance activities: Berg Balance Scale, score 35/56. Patient demonstrates increased fall risk as noted by score of  35/56 on Berg Balance Scale.  (<36= high risk for falls, close to 100%; 37-45 significant >80%; 46-51 moderate >50%; 52-55 lower >25%)  Patient in recliner at end of session with breaks locked, chair alarm set, and all needs within reach.    Therapy Documentation Precautions:  Precautions Precautions: Fall Precaution Comments: monitor O2 ( used 6 liters for activity and 3 at rest) Restrictions Weight Bearing Restrictions: No  Balance: Standardized Balance Assessment Standardized Balance Assessment: Berg Balance Test Berg Balance Test Sit to Stand: Able to stand without using hands and stabilize independently Standing Unsupported: Able to stand safely 2 minutes Sitting with Back Unsupported but Feet Supported on Floor or Stool: Able to sit safely and securely 2 minutes Stand to Sit: Sits safely with minimal use of hands Transfers: Able to transfer safely, minor use of hands Standing Unsupported with Eyes Closed: Able to stand 3 seconds Standing Ubsupported with Feet Together: Able to place feet together independently and stand for 1 minute with supervision From Standing, Reach Forward with Outstretched Arm: Can reach forward >5 cm safely (2") From Standing Position, Pick up Object from Floor: Able to pick up shoe, needs supervision From Standing Position, Turn to Look Behind Over each Shoulder: Needs supervision when turning Turn 360 Degrees: Needs close supervision or verbal cueing  Standing Unsupported, Alternately Place Feet on Step/Stool: Needs assistance to keep from falling or unable to try Standing Unsupported, One Foot in Front: Able to take small  step independently and hold 30 seconds Standing on One Leg: Tries to lift leg/unable to hold 3 seconds but remains standing independently Total Score: 35    Therapy/Group: Individual Therapy  Sharde Gover L Doriana Mazurkiewicz PT, DPT  03/04/2019, 11:56 AM

## 2019-03-04 NOTE — Progress Notes (Signed)
Physical Therapy Session Note  Patient Details  Name: Kurt [name] MRN: [id number] Date of Birth: 1950-11-24  Today's Date: 03/04/2019 PT Individual Time: 7782-4235 PT Individual Time Calculation (min): 75 min   Short Term Goals: Week 2:  PT Short Term Goal 1 (Week 2): STG=LTG due to ELOS.  Skilled Therapeutic Interventions/Progress Updates:     Patient in recliner in room upon PT arrival. Patient alert and agreeable to PT session. Patient denied pain during session, however, continued to report increased fatigue today. Educated on fluctuation in energy and performance during recovery and benefits of sleep for recovery. Patient receptive to education. Focused session on gait training for home entry/exit due to variable high level gait and endurance requirements and patient's goal to be independent with this upon d/c. He was on 1L/min O2 upon PT entry, increased to 2L/min with w/c mobility and 4L/min with gait training. O2 saturations remained >90 throughout.   Therapeutic Activity: Transfers: Patient performed sit to/from stand x7 with mod I with and without a SPC.   Gait Training:  Patient performed 4 trials of a simulated home set up from his vehicle to the entrance of his home by stepping ambulating 6 feet, stepping up/down a 4" step with a wall to hold onto, ambulating  10 feet, ascending/decending 8-6" steps using 1 rail and stepping up/down a 7" step using a SPC throughout with CGA. Ambulated with decreased gait speed, increased lateral trunk sway, and step-to gait pattern on steps leading with R while ascending and L while descending. Provided verbal cues for sequencing, pacing activity to manage endurance, and paced breathing. PT managed O2 tank, set to 4L/min, throughout.  Trial 1: required a sitting rest break after first set of 4 steps, O2 sats dropped to 90%, recovered to 96% in <1.5 min.  Trial 2: required a sitting rest break after eight steps, O2 sats  Dropped to 91%, recovered  to 97% in <1.5 min. Trial 3 and 4: required a standing rest break on the stairs x1, O2 sats 90% after completion, recovered to 96% in <2 min  Therapeutic Exercise: Patient performed the following exercises with verbal and tactile cues for proper technique. Propelled w/c from his room to/from the main gym using B UEs and LEs for UE endurance and energy conservation for gait training. Provided cues for B UE use and decreased LE use to target UE endurance/strengthening.  Patient in recliner in room at end of session with breaks locked, chair alarm set, and all needs within reach. Encourage patient to reach out to friends/family to have assistance the first week upon d/c to enter/exit his home for safety. Patient in agreement and will see if he can arrange for this prior to d/c.  Therapy Documentation Precautions:  Precautions Precautions: Fall Precaution Comments: monitor O2 ( used 6 liters for activity and 3 at rest) Restrictions Weight Bearing Restrictions: No    Therapy/Group: Individual Therapy  Sarajane Fambrough L Crisol Muecke PT, DPT  03/04/2019, 4:02 PM

## 2019-03-05 ENCOUNTER — Inpatient Hospital Stay (HOSPITAL_COMMUNITY): Payer: Medicare Other

## 2019-03-05 ENCOUNTER — Inpatient Hospital Stay (HOSPITAL_COMMUNITY): Payer: Medicare Other | Admitting: Occupational Therapy

## 2019-03-05 NOTE — Plan of Care (Signed)
  Problem: RH BOWEL ELIMINATION Goal: RH STG MANAGE BOWEL WITH ASSISTANCE Description: STG Manage Bowel with  Min Assistance. Outcome: Progressing Goal: RH STG MANAGE BOWEL W/MEDICATION W/ASSISTANCE Description: STG Manage Bowel with Medication with Min  Assistance. Outcome: Progressing   Problem: RH BLADDER ELIMINATION Goal: RH STG MANAGE BLADDER WITH ASSISTANCE Description: STG Manage Bladder With Min Assistance Outcome: Progressing   Problem: RH SKIN INTEGRITY Goal: RH STG SKIN FREE OF INFECTION/BREAKDOWN Outcome: Progressing Goal: RH STG MAINTAIN SKIN INTEGRITY WITH ASSISTANCE Description: STG Maintain Skin Integrity With Min Assistance. Outcome: Progressing   Problem: RH SAFETY Goal: RH STG ADHERE TO SAFETY PRECAUTIONS W/ASSISTANCE/DEVICE Description: STG Adhere to Safety Precautions With Min  Assistance/Device. Outcome: Progressing Goal: RH STG DECREASED RISK OF FALL WITH ASSISTANCE Description: STG Decreased Risk of Fall With [employer]. Outcome: Progressing   Problem: RH PAIN MANAGEMENT Goal: RH STG PAIN MANAGED AT OR BELOW PT'S PAIN GOAL Description: Pt will be free of pain Outcome: Progressing   Problem: Consults Goal: RH GENERAL PATIENT EDUCATION Description: See Patient Education module for education specifics. Outcome: Progressing

## 2019-03-05 NOTE — Progress Notes (Signed)
Social Work Patient ID: Kurt [name], male   DOB: 1950/11/26, 69 y.o.   MRN: [id number]    SW received a msg on 2/11 from [employer] RN/CM [phone]) informing he is approved through 2/17 with d/c on 2/18 and there will be a letter sent via fax. SW informed appropriate staff.   Cecile Sheerer, MSW, LCSWA Office (preferred): [phone] Cell: [phone] Fax: [phone]

## 2019-03-05 NOTE — Progress Notes (Signed)
Physical Therapy Session Note  Patient Details  Name: Kurt [name] MRN: [id number] Date of Birth: 1950-08-06  Today's Date: 03/05/2019 PT Individual Time: 6761-9509 and 10:00-11:00 PT Individual Time Calculation (min): 73 min and 60 min  Short Term Goals: Week 1:  PT Short Term Goal 1 (Week 1): STG=LTG due to short ELOS. PT Short Term Goal 1 - Progress (Week 1): Progressing toward goal Week 2:  PT Short Term Goal 1 (Week 2): STG=LTG due to ELOS. Week 3:     Skilled Therapeutic Interventions/Progress Updates:    PAIN Denies pain  Pt initially OOB in recliner and agreeable to treatment session with focus on  Endurance, balance, functional mobility   STS from recliner to rollator w/supervison.  Gait room to gym x 220  Ft w/rollator w/cga, cues to increase base of support, 4L02 via [place].   HR 107, 02 sats 90%, recovers within 1 min to upper 90s , utilizes diaphragmatic breathing without cuing required.  Gait 91ft as above to stairs.  Rested x 3 min in sitting then proceeded w/stairs as follows: STS from standard armchair w/supervision.  Gait 82ft to stairs without AD w/min assist w/increased gait deviations.  Ascends 4 stairs 2/2 rails x 2 w/cga. 02sats 90%, HR 109  Performed as training in preparation for home entry.  Repeated stairs as above 02Sats 90%, HR 116  Repeated STS from standard height armchair x 8 reps for global strength and endurance. 02sats 94 HR 96  PM session:  PAIN Denies pain this pm Pt initially OOB in recliner and agreeable to treatment session with focus on  blance and endurance. Discussed practicing home entry set up as done in session 2/11, but  Pt stating "I dont have as much gas in the tank as I did this am".   wc prpulsion w/bilat LE's x 269ft at moderate pace, supervision only.  O2 at 4 L during this.  02 sats 95, HR 109  Pt requested to use bathroom.  Transported to BR and performs STS from wc, standing at commode to urinate, sidestep to sink w/one  hand on sink for balance,  washes hands, stand to sit all w/supervision.    Standing balance activites as follows - on 3L 02 for this: Attempted alternating tapping of 5in step but pt c/o discomfort in low back w/ this and discontinued.  Static stand w/feet together cga                        Feet toether alternating arm raises - cga                        Eyes closed - mod assist after 5 sec due to increased sway/LOB/mod assist for recovery Standing feet tandem R behind cga, alt arm raises cga  Horseshoe toss - pt retrieves horseshoe placed to R at progressive distances and tosses to target.  Repeated to L for balance/wtshifting/endurance. cga for balance, wide stance   Requires seated rest between efforts.   02 sats 92, HR 109  Repeated STS from mat without use of UE's w/min assist for balance/tends to overshoot w/ant tendency, repeated x 7 02sats 90, HR 111  SPT mat to wc w/cga no AD  wc propulsion x 253ft w/bilat LE's, UEs 02sats 94, HR 111  wc to NuStep w/cga SPT no AD NuStep x   5 Min    RPE 4-5 total steps 356  Performed for cardiovascular conditioning/global  strengthening.  wc propulsion x 66ft to room as above.  Short distance gait wc to recliner w/min assist no AD, transfers to recliner w/cga, cues for safety.  Pt returned to 1L 02.  02 sats 97% at rest.   Pt left oob in recliner w/chair alarm set and needs in reach.  Pt able to tolerate moderate exertional activities x 1.25hr using 3L, more demanding activites in am session such as gait and stairs require 4L.  Will continue w/attempts to wean as able.      Therapy Documentation Precautions:  Precautions Precautions: Fall Precaution Comments: monitor O2 ( used 6 liters for activity and 3 at rest) Restrictions Weight Bearing Restrictions: No    Therapy/Group: Individual Therapy  Callie Fielding, PT   Jerrilyn Cairo 03/05/2019, 2:41 PM

## 2019-03-05 NOTE — Progress Notes (Signed)
Social Work Patient ID: Kurt [name], male   DOB: Jul 26, 1950, 69 y.o.   MRN: [id number]   SW met with pt in room to review team recommendations: Outpatient PT/OT and DME: TTB and 3in1 BSC. Pt informed unable to purchase TTB as will not be covered under insurance. SW discussed other locations to purchase item. PT aware SW will order 3in1 BSC. SW discussed support services when at home to ensure he is safe. Pt states he will be his own primary caregiver and there will not be anyone else. Pt is confident that he will be able to get to/from appointments driving on his own even with oxygen. His primary concern is not having to use a walker and oxygen at the same time. Pt provided Outpatient list via [email or web address], and will follow-up about preference on Monday. Pt unable to remember name of DME company for home o2.   SW found vendor: [employer]. SW spoke with [name]/[employer] ([phone]/f:[phone]) who confirms services for home o2. 3in1 BSC in stock at this time. Reports order will be shipped to the home. SW to send order on Monday. SW updated pt with above information.   Loralee Pacas, MSW, [place] Office: [phone] Cell: [phone] Fax: [phone]

## 2019-03-05 NOTE — Progress Notes (Signed)
Occupational Therapy Session Note  Patient Details  Name: Kurt [name] MRN: [id number] Date of Birth: [birth year]  Today's Date: 03/05/2019 OT Individual Time: 0447-1580 OT Individual Time Calculation (min): 60 min    Short Term Goals: Week 2:  OT Short Term Goal 1 (Week 2): STG = LTG   Skilled Therapeutic Interventions/Progress Updates:    Patient seated in recliner, states that he slept better last night.  O2 sat on 2L at start of session with saturation 96%.  Completed supine shoulder flex, abd and scapular mobility exercises without difficulty, increased bed to 25 degrees and repeated exercises without difficulty, repeated again at 45 degrees - unable to complete flexion and abduction without compensating.   Patient ambulated 215 feet with rollator to therapy gym, CS on 4L O2 - saturation remained >88% Completed weight bearing, modified pushups, closed chain flexion on wedge (seated and in stance) , proximal stability exercises seated and in stance with CS.  Reviewed breathing techniques t/o session and reinforced slow rhythmic breath as he tends to increase rate to match his activity with increased coughing - improved saturation and less coughing noted with this in mind.   Ambulated with rollator back to room 215 feet - saturation 93% at end of this walk - no coughing.  He remained sitting in recliner at close of session with all needs met.  Therapy Documentation Precautions:  Precautions Precautions: Fall Precaution Comments: monitor O2 ( used 6 liters for activity and 3 at rest) Restrictions Weight Bearing Restrictions: No   Other Treatments:     Therapy/Group: Individual Therapy  Carlos Levering 03/05/2019, 7:37 AM

## 2019-03-05 NOTE — Progress Notes (Signed)
[place] PHYSICAL MEDICINE & REHABILITATION PROGRESS NOTE   Subjective/Complaints:   Pt walked 215 ft with rollator- supervision on 4L- sats 88% and above during entire walk.   Feeling overall better- and feels like SOB is improving.   ROS; pt denies CP, abd pain, N/V/C/D, Headache, vision changes- denies SOB and sore throat except a little at night.    Objective:   No results found. Recent Labs    03/03/19 0534  WBC 13.5*  HGB 12.1*  HCT 38.2*  PLT 333   Recent Labs    03/03/19 0534  NA 137  K 4.3  CL 98  CO2 28  GLUCOSE 98  BUN 32*  CREATININE 1.34*  CALCIUM 9.0    Intake/Output Summary (Last 24 hours) at 03/05/2019 0925 Last data filed at 03/05/2019 0816 Gross per 24 hour  Intake 547 ml  Output 1625 ml  Net -1078 ml     Physical Exam: Vital Signs Blood pressure 119/70, pulse 87, temperature 98.9 F (37.2 C), temperature source Oral, resp. rate 20, height 5\' 10"  (1.778 m), weight 112.2 kg, SpO2 96 %.   Awake, alert, appropriate, sitting on rollator in gym; with PT; sats 92% on  3L- just walked 215 ft supervision with rollator; usually on 4L (not 6L anymore) for gait, NAD HENT:  Head: Normocephalic and atraumatic.  Mouth/Throat: mild erythema back of oropharynx Eyes: conjugate gaze Neck: No tracheal deviation present.  Cardiovascular: RRR no M/R/G Respiratory: CTA B/L no W/R/R GI: Soft, NT, ND, (+BS Musculoskeletal:        Comments: UEs- deltoid 3-/5 B/L; 5-/5 in arms biceps, triceps, WE, grip, finger abd B/L LEs- HF, KE, KF, DF, PF all 5-/5 B/L  Neurological: He is alert and oriented to person, place, and time. Decreased sensation to light touch in LEs; and LUE to elbow has decreased sensation to light touch.  Intact sensation in RUE.  Skin: Skin is warm and dry.  Bruise on knee  Psychiatric:  Calm, praying          Assessment/Plan: 1. Functional deficits secondary to debility from 3rd case of pneumonia since COVID 12/20- currently  pseudomonas pneumonia which require 3+ hours per day of interdisciplinary therapy in a comprehensive inpatient rehab setting.  Physiatrist is providing close team supervision and 24 hour management of active medical problems listed below.  Physiatrist and rehab team continue to assess barriers to discharge/monitor patient progress toward functional and medical goals  Care Tool:  Bathing  Bathing activity did not occur: Safety/medical concerns(declined this session due to fatigue) Body parts bathed by patient: Right arm, Left arm, Chest, Abdomen, Front perineal area, Buttocks, Right upper leg, Left upper leg, Right lower leg, Left lower leg, Face         Bathing assist Assist Level: Set up assist     Upper Body Dressing/Undressing Upper body dressing   What is the patient wearing?: Pull over shirt    Upper body assist Assist Level: Set up assist    Lower Body Dressing/Undressing Lower body dressing      What is the patient wearing?: Pants, Underwear/pull up     Lower body assist Assist for lower body dressing: Set up assist     Toileting Toileting    Toileting assist Assist for toileting: Supervision/Verbal cueing Assistive Device Comment: urinal   Transfers Chair/bed transfer  Transfers assist     Chair/bed transfer assist level: Supervision/Verbal cueing Chair/bed transfer assistive device: 1/21   Ambulation assist  Assist level: Contact Guard/Touching assist Assistive device: Cane-straight Max distance: 10'   Walk 10 feet activity   Assist     Assist level: Contact Guard/Touching assist Assistive device: Cane-straight   Walk 50 feet activity   Assist    Assist level: Contact Guard/Touching assist Assistive device: Rollator    Walk 150 feet activity   Assist Walk 150 feet activity did not occur: Safety/medical concerns  Assist level: Contact Guard/Touching assist Assistive device: Rollator    Walk  10 feet on uneven surface  activity   Assist Walk 10 feet on uneven surfaces activity did not occur: Safety/medical concerns(decreased activity tolerance/balance)         Wheelchair     Assist Will patient use wheelchair at discharge?: No Type of Wheelchair: Manual    Wheelchair assist level: Supervision/Verbal cueing Max wheelchair distance: 150'    Wheelchair 50 feet with 2 turns activity    Assist        Assist Level: Supervision/Verbal cueing   Wheelchair 150 feet activity     Assist      Assist Level: Supervision/Verbal cueing   Blood pressure 119/70, pulse 87, temperature 98.9 F (37.2 C), temperature source Oral, resp. rate 20, height 5\' 10"  (1.778 m), weight 112.2 kg, SpO2 96 %.  1.  Impaired mobility, function, and ADLs secondary to debility from  Pseudomonas pneumonia s/p 3rd recent hospitalization for pneumonia- initial hospital stay for COVID pneumonia.   2/11- back down on O2- down to 2-3L overall- used to be on 4-6L. Will con't to help.              -patient may shower             -ELOS/Goals: 2- 2.5 weeks; goals mod I  Continue CIR PT and OT, progressing well.  2.  Antithrombotics: -DVT/anticoagulation:  Pharmaceutical: Lovenox             -antiplatelet therapy: N/a 3. Chronic back pain/Pain Management: Hydrocodone prn  -well controlled; not requiring Hydrocodone.   4. H/o depression/Mood: team to provide ego support and help with anxiety.              -antipsychotic agents: N/A 5. Neuropsych: This patient is capable of making decisions on his own behalf. 6. Skin/Wound Care: routine pressure relief measures.  7. Fluids/Electrolytes/Nutrition: Monitor I/O. Check lytes in am.   2/3- lytes and Cr/OK  2/8: I have personally reviewed labs. Creatinine continues to trend upward. 1.37 today. Encourage fluid intake; will place general nursing order. Will repeat BMP tomorrow to trend. If continues to rise, will consider IV fluids.   2/10- gave  IVFs 2/9- Cr dropped from 1.52 to 1.34 but BUN still 32- will allow PO intake and recheck Friday since sitl elevated.  8. 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 Bactrim DS MWF for PJP prophylaxis. Duonebs tid  2/3- Duonebs made prn - encouraged pt to ask for them when coughing or SOB.  2/10- con't Bactrim for prophylaxis will check with ID when to stop bactrim.   2/12- although O2 use improving, still using 4L to walk- will need to go home on O2 at d/c.  10. OSA: Encourage CPAP use at nights.  11. Pharyngeal mucositis: MMW Qid (was TID) and Nystatin mouth wash qid and trial of nebulized lidocaine added on  2/1. Will also add Diflucan 200 mg x 1 and 100 daily x  6 days.  2/4- added Flonase for sore throat   2/5- improved on exam and by hx  12. CKD Stage IIIa: Monitor BMET with serial checks.   2/3- Cr 1.12 and BUN slightly elevated at 25- a little dry- will encourage fluid intake.   2/6- recheck Monday   2/8: See #7  2/9- reviewed labs- Cr up to 1.52 from 1.39- BUN u to 31; will give IVFs 75 cc/hr x 12 hours and recheck in AM  2/10- gave IVFs 2/9- Cr dropped from 1.52 to 1.34 but BUN still 32- will allow PO intake and recheck Friday since sitl elevated.  2/12- ordered labs- not back- will monitor- will recheck Monday.   13. H/o recurrent GIB/chronic gastritis 05/2018: PPI recommended indefinitely-- will resume with recent use of NSAIDs as well as steroids on board.  14. Chronic diastolic CHF- will monitor daily weights and treat as required.   2/3- will order daily weights   Filed Weights   03/02/19 0623 03/03/19 0500 03/04/19 0700  Weight: 114.1 kg 115.4 kg 112.2 kg    2/6- will follow for trend  2/10- Weight back up 1 kg- expected since received IVFs- 115 kg- will monitor to make sure doesn't go too high  2/11- 112 kg- down 3 kg- doesn't make sense  2/12- no weight today   15.  Leukocytosis  2/3- on Prednisone 60 mg daily currently- could be that, however of note, last WBC was 8.8- so unclear- afebrile- will con't to monitor closely for signs of additional infection.   2/4- will recheck labs in AM  2/5- WBC down to 10.6  2/8: Back up to 13.4, asymptomatic. Will repeat tomorrow to trend.   2/9- WBC 13.9- on Prednisone- asymptomatic; will check U/A and Cx just to make sure nothing else- lungs truly CLA B/L   2/10- U/A (-)- WBC down to 13.4- afebrile- no signs or Sx's of infection.   2/11- pt having hot flashes- explained will have hot flashes while on Prednisone, it's common.   2/12- ordered labs for today- didn't come back- are ordered for Monday.  16. Prolonged QT interval  2/5- cannot prescribe Trazodone- will try Melatonin  2/6- had Melatonin 6 mg QHS- pt said better, but still wakes due to throat  2/10- sleeping better- con't regimen.   LOS: 10 days A FACE TO FACE EVALUATION WAS PERFORMED  Eustolia Drennen 03/05/2019, 9:25 AM

## 2019-03-06 ENCOUNTER — Encounter (HOSPITAL_BASED_OUTPATIENT_CLINIC_OR_DEPARTMENT_OTHER): Payer: Medicare Other | Admitting: Internal Medicine

## 2019-03-06 ENCOUNTER — Inpatient Hospital Stay (HOSPITAL_COMMUNITY): Payer: Medicare Other | Admitting: Occupational Therapy

## 2019-03-06 NOTE — Progress Notes (Signed)
Occupational Therapy Session Note  Patient Details  Name: Kurt [name] MRN: [id number] Date of Birth: [birth year]  Today's Date: 03/06/2019 OT Individual Time: 1415-1526 OT Individual Time Calculation (min): 71 min    Short Term Goals: Week 2:  OT Short Term Goal 1 (Week 2): STG = LTG  Skilled Therapeutic Interventions/Progress Updates:    patient seated in recliner, denies pain and requests a shower at the end of the session.  Completed ambulation on unit with rollator on 4L O2  3 x 200+ feet - saturation at 85-88% with recovery to low 90s within 30 seconds.  Ambulation 2 x 100+ feet with saturation at 92-93%.  Completed nustep 7 minutes with UB and LB - saturation at 93% following this activity.   Transfer to shower bench with DS, undressed, completed shower and redressed to include socks and shoes DS/mod I.  Patient states that he is pleased with his progress and is feeling ready for discharge this coming week.  He returned to recliner at close of session, call bell and tray table in reach - on 1L O2 at rest.     Therapy Documentation Precautions:  Precautions Precautions: Fall Precaution Comments: monitor O2 ( used 6 liters for activity and 3 at rest) Restrictions Weight Bearing Restrictions: No General:   Vital Signs: Therapy Vitals Temp: 97.6 F (36.4 C) Temp Source: Oral Pulse Rate: 81 Resp: 16 BP: 125/82 Patient Position (if appropriate): Lying Oxygen Therapy SpO2: 96 % O2 Device: Nasal Cannula;Other (Comment)(1 liter)   Other Treatments:     Therapy/Group: Individual Therapy  Barrie Lyme 03/06/2019, 7:42 AM

## 2019-03-06 NOTE — Progress Notes (Signed)
[place] PHYSICAL MEDICINE & REHABILITATION PROGRESS NOTE   Subjective/Complaints:   Patient very pleased that his progress he is down to 1 L of oxygen at rest and 3 to 4 L with activity.  ROS; pt denies CP, abd pain, N/V/C/D, Headache, vision changes- denies SOB and sore throat except a little at night.    Objective:   No results found. No results for input(s): WBC, HGB, HCT, PLT in the last 72 hours. No results for input(s): NA, K, CL, CO2, GLUCOSE, BUN, CREATININE, CALCIUM in the last 72 hours.  Intake/Output Summary (Last 24 hours) at 03/06/2019 1032 Last data filed at 03/06/2019 0730 Gross per 24 hour  Intake 910 ml  Output 450 ml  Net 460 ml     Physical Exam: Vital Signs Blood pressure 125/82, pulse 81, temperature 97.6 F (36.4 C), temperature source Oral, resp. rate 16, height 5\' 10"  (1.778 m), weight 112.2 kg, SpO2 96 %.   Awake, alert, appropriate, sitting on rollator in gym; with PT; sats 92% on  3L- just walked 215 ft supervision with rollator; usually on 4L (not 6L anymore) for gait, NAD HENT:  Head: Normocephalic and atraumatic.  Mouth/Throat: mild erythema back of oropharynx Eyes: conjugate gaze Neck: No tracheal deviation present.  Cardiovascular: RRR no M/R/G Respiratory: CTA B/L no W/R/R GI: Soft, NT, ND, (+BS Musculoskeletal:        Comments: UEs- deltoid 3-/5 B/L; 5-/5 in arms biceps, triceps, WE, grip, finger abd B/L LEs- HF, KE, KF, DF, PF all 5-/5 B/L  Neurological: He is alert and oriented to person, place, and time. Decreased sensation to light touch in LEs; and LUE to elbow has decreased sensation to light touch.  Intact sensation in RUE.  Skin: Skin is warm and dry.  Bruise on knee  Psychiatric:  Calm, praying          Assessment/Plan: 1. Functional deficits secondary to debility from 3rd case of pneumonia since COVID 12/20- currently pseudomonas pneumonia which require 3+ hours per day of interdisciplinary therapy in a  comprehensive inpatient rehab setting.  Physiatrist is providing close team supervision and 24 hour management of active medical problems listed below.  Physiatrist and rehab team continue to assess barriers to discharge/monitor patient progress toward functional and medical goals  Care Tool:  Bathing  Bathing activity did not occur: Safety/medical concerns(declined this session due to fatigue) Body parts bathed by patient: Right arm, Left arm, Chest, Abdomen, Front perineal area, Buttocks, Right upper leg, Left upper leg, Right lower leg, Left lower leg, Face         Bathing assist Assist Level: Set up assist     Upper Body Dressing/Undressing Upper body dressing   What is the patient wearing?: Pull over shirt    Upper body assist Assist Level: Set up assist    Lower Body Dressing/Undressing Lower body dressing      What is the patient wearing?: Pants, Underwear/pull up     Lower body assist Assist for lower body dressing: Set up assist     Toileting Toileting    Toileting assist Assist for toileting: Supervision/Verbal cueing Assistive Device Comment: urinal   Transfers Chair/bed transfer  Transfers assist     Chair/bed transfer assist level: Supervision/Verbal cueing Chair/bed transfer assistive device: Other(rollator)   Locomotion Ambulation   Ambulation assist      Assist level: Contact Guard/Touching assist Assistive device: Rollator Max distance: 220   Walk 10 feet activity   Assist  Assist level: Contact Guard/Touching assist Assistive device: Rollator   Walk 50 feet activity   Assist    Assist level: Contact Guard/Touching assist Assistive device: Rollator    Walk 150 feet activity   Assist Walk 150 feet activity did not occur: Safety/medical concerns  Assist level: Contact Guard/Touching assist Assistive device: Rollator    Walk 10 feet on uneven surface  activity   Assist Walk 10 feet on uneven surfaces activity  did not occur: Safety/medical concerns(decreased activity tolerance/balance)         Wheelchair     Assist Will patient use wheelchair at discharge?: No Type of Wheelchair: Manual    Wheelchair assist level: Supervision/Verbal cueing Max wheelchair distance: 150'    Wheelchair 50 feet with 2 turns activity    Assist        Assist Level: Supervision/Verbal cueing   Wheelchair 150 feet activity     Assist      Assist Level: Supervision/Verbal cueing   Blood pressure 125/82, pulse 81, temperature 97.6 F (36.4 C), temperature source Oral, resp. rate 16, height 5\' 10"  (1.778 m), weight 112.2 kg, SpO2 96 %.  1.  Impaired mobility, function, and ADLs secondary to debility from  Pseudomonas pneumonia s/p 3rd recent hospitalization for pneumonia- initial hospital stay for COVID pneumonia.   Reduced O2 requirements             -ELOS/Goals: 2- 2.5 weeks; goals mod I  Continue CIR PT and OT, progressing well.  2.  Antithrombotics: -DVT/anticoagulation:  Pharmaceutical: Lovenox             -antiplatelet therapy: N/a 3. Chronic back pain/Pain Management: Hydrocodone prn  -well controlled; not requiring Hydrocodone.   4. H/o depression/Mood: team to provide ego support and help with anxiety.              -antipsychotic agents: N/A 5. Neuropsych: This patient is capable of making decisions on his own behalf. 6. Skin/Wound Care: routine pressure relief measures.  7. Fluids/Electrolytes/Nutrition: Monitor I/O.  Fluid intake is marginal, 860 mL yesterday   10. OSA: Encourage CPAP use at nights.  11. Pharyngeal mucositis: MMW Qid (was TID) and Nystatin mouth wash qid and trial of nebulized lidocaine added on  2/1. Will also add Diflucan 200 mg x 1 and 100 daily x 6 days.  2/4- added Flonase for sore throat   2/5- improved on exam and by hx  12. CKD Stage IIIa: Monitor BMET with serial checks.   2/3- Cr 1.12 and BUN slightly elevated at 25- a little dry- will encourage  fluid intake.   2/6- recheck Monday   2/8: See #7  2/9- reviewed labs- Cr up to 1.52 from 1.39- BUN u to 31; will give IVFs 75 cc/hr x 12 hours and recheck in AM  2/10- gave IVFs 2/9- Cr dropped from 1.52 to 1.34 but BUN still 32- will allow PO intake and recheck Friday since sitl elevated.  2/12- ordered labs- not back- will monitor- will recheck Monday.   13. H/o recurrent GIB/chronic gastritis 05/2018: PPI recommended indefinitely-- will resume with recent use of NSAIDs as well as steroids on board.  14. Chronic diastolic CHF- will monitor daily weights and treat as required.   2/3- will order daily weights   Filed Weights   03/02/19 0623 03/03/19 0500 03/04/19 0700  Weight: 114.1 kg 115.4 kg 112.2 kg  Weights ranging from 112-115.  No clinical signs of CHF 16. Prolonged QT interval  Avoiding medications which prolong  LOS: 11 days A FACE TO FACE EVALUATION WAS PERFORMED  Charlett Blake 03/06/2019, 10:32 AM

## 2019-03-07 MED ORDER — ALBUTEROL SULFATE (2.5 MG/3ML) 0.083% IN NEBU
3.0000 mL | INHALATION_SOLUTION | Freq: Four times a day (QID) | RESPIRATORY_TRACT | Status: DC
  Administered 2019-03-07: 3 mL via RESPIRATORY_TRACT
  Filled 2019-03-07: qty 3

## 2019-03-07 MED ORDER — ALBUTEROL SULFATE (2.5 MG/3ML) 0.083% IN NEBU
3.0000 mL | INHALATION_SOLUTION | Freq: Two times a day (BID) | RESPIRATORY_TRACT | Status: DC
  Administered 2019-03-07: 3 mL via RESPIRATORY_TRACT
  Filled 2019-03-07 (×2): qty 3

## 2019-03-07 NOTE — Progress Notes (Signed)
[place] PHYSICAL MEDICINE & REHABILITATION PROGRESS NOTE   Subjective/Complaints:   Patient very pleased that his progress he is down to 1 L of oxygen at rest and 3 to 4 L with activity. Still has a dry cough.  Did not use inhalers at home  ROS; pt denies CP, abd pain, N/V/C/D, Headache, vision changes- denies SOB and sore throat except a little at night.    Objective:   No results found. No results for input(s): WBC, HGB, HCT, PLT in the last 72 hours. No results for input(s): NA, K, CL, CO2, GLUCOSE, BUN, CREATININE, CALCIUM in the last 72 hours.  Intake/Output Summary (Last 24 hours) at 03/07/2019 0929 Last data filed at 03/07/2019 0800 Gross per 24 hour  Intake 740 ml  Output 1200 ml  Net -460 ml     Physical Exam: Vital Signs Blood pressure (!) 141/83, pulse 77, temperature 97.8 F (36.6 C), resp. rate 18, height 5\' 10"  (1.778 m), weight 112.6 kg, SpO2 98 %.   Awake, alert, appropriate, sitting on rollator in gym; with PT; sats 92% on  3L- just walked 215 ft supervision with rollator; usually on 4L (not 6L anymore) for gait, NAD HENT:  Head: Normocephalic and atraumatic.  Mouth/Throat: mild erythema back of oropharynx Eyes: conjugate gaze Neck: No tracheal deviation present.  Cardiovascular: RRR no M/R/G Respiratory: CTA B/L no wheezing but does have a dry cough with deeper inspiration GI: Soft, NT, ND, (+BS Musculoskeletal:        Comments: UEs- deltoid 3-/5 B/L; 5-/5 in arms biceps, triceps, WE, grip, finger abd B/L LEs- HF, KE, KF, DF, PF all 5-/5 B/L, unchanged Neurological: He is alert and oriented to person, place, and time. Decreased sensation to light touch in LEs; and LUE to elbow has decreased sensation to light touch.  Intact sensation in RUE.  Skin: Skin is warm and dry.  Bruise on knee  Psychiatric:  Calm, praying          Assessment/Plan: 1. Functional deficits secondary to debility from 3rd case of pneumonia since COVID 12/20-  currently pseudomonas pneumonia which require 3+ hours per day of interdisciplinary therapy in a comprehensive inpatient rehab setting.  Physiatrist is providing close team supervision and 24 hour management of active medical problems listed below.  Physiatrist and rehab team continue to assess barriers to discharge/monitor patient progress toward functional and medical goals  Care Tool:  Bathing  Bathing activity did not occur: Safety/medical concerns(declined this session due to fatigue) Body parts bathed by patient: Right arm, Left arm, Chest, Abdomen, Front perineal area, Buttocks, Right upper leg, Left upper leg, Right lower leg, Left lower leg, Face         Bathing assist Assist Level: Set up assist     Upper Body Dressing/Undressing Upper body dressing   What is the patient wearing?: Pull over shirt    Upper body assist Assist Level: Set up assist    Lower Body Dressing/Undressing Lower body dressing      What is the patient wearing?: Pants, Underwear/pull up     Lower body assist Assist for lower body dressing: Set up assist     Toileting Toileting    Toileting assist Assist for toileting: Supervision/Verbal cueing Assistive Device Comment: urinal   Transfers Chair/bed transfer  Transfers assist     Chair/bed transfer assist level: Supervision/Verbal cueing Chair/bed transfer assistive device: Other(rollator)   Locomotion Ambulation   Ambulation assist      Assist level: Contact Guard/Touching assist  Assistive device: Rollator Max distance: 220   Walk 10 feet activity   Assist     Assist level: Contact Guard/Touching assist Assistive device: Rollator   Walk 50 feet activity   Assist    Assist level: Contact Guard/Touching assist Assistive device: Rollator    Walk 150 feet activity   Assist Walk 150 feet activity did not occur: Safety/medical concerns  Assist level: Contact Guard/Touching assist Assistive device: Rollator     Walk 10 feet on uneven surface  activity   Assist Walk 10 feet on uneven surfaces activity did not occur: Safety/medical concerns(decreased activity tolerance/balance)         Wheelchair     Assist Will patient use wheelchair at discharge?: No Type of Wheelchair: Manual    Wheelchair assist level: Supervision/Verbal cueing Max wheelchair distance: 150'    Wheelchair 50 feet with 2 turns activity    Assist        Assist Level: Supervision/Verbal cueing   Wheelchair 150 feet activity     Assist      Assist Level: Supervision/Verbal cueing   Blood pressure (!) 141/83, pulse 77, temperature 97.8 F (36.6 C), resp. rate 18, height 5\' 10"  (1.778 m), weight 112.6 kg, SpO2 98 %.  1.  Impaired mobility, function, and ADLs secondary to debility from  Pseudomonas pneumonia s/p 3rd recent hospitalization for pneumonia- initial hospital stay for COVID pneumonia.    Reduced O2 requirements, still with dry cough we will schedule his albuterol and monitor             -ELOS/Goals: 2- 2.5 weeks; goals mod I  Continue CIR PT and OT, progressing well.  2.  Antithrombotics: -DVT/anticoagulation:  Pharmaceutical: Lovenox             -antiplatelet therapy: N/a 3. Chronic back pain/Pain Management: Hydrocodone prn  -well controlled; not requiring Hydrocodone.   4. H/o depression/Mood: team to provide ego support and help with anxiety.              -antipsychotic agents: N/A 5. Neuropsych: This patient is capable of making decisions on his own behalf. 6. Skin/Wound Care: routine pressure relief measures.  7. Fluids/Electrolytes/Nutrition: Monitor I/O.  Fluid intake is marginal, 860 mL yesterday   10. OSA: Encourage CPAP use at nights.  Patient states that he has lost about 50 pounds since his illness. 11. Pharyngeal mucositis: MMW Qid (was TID) and Nystatin mouth wash qid and trial of nebulized lidocaine added on  2/1. Will also add Diflucan 200 mg x 1 and 100 daily x 6  days.  2/4- added Flonase for sore throat   2/5- improved on exam and by hx  12. CKD Stage IIIa: Monitor BMET with serial checks.   2/3- Cr 1.12 and BUN slightly elevated at 25- a little dry- will encourage fluid intake.   2/6- recheck Monday   2/8: See #7  2/9- reviewed labs- Cr up to 1.52 from 1.39- BUN u to 31; will give IVFs 75 cc/hr x 12 hours and recheck in AM  2/10- gave IVFs 2/9- Cr dropped from 1.52 to 1.34 but BUN still 32- will allow PO intake and recheck Friday since sitl elevated.  2/12- ordered labs- not back- will monitor- will recheck Monday.   13. H/o recurrent GIB/chronic gastritis 05/2018: PPI recommended indefinitely-- will resume with recent use of NSAIDs as well as steroids on board.  14. Chronic diastolic CHF- will monitor daily weights and treat as required.   2/3- will order daily  weights   Filed Weights   03/03/19 0500 03/04/19 0700 03/07/19 0606  Weight: 115.4 kg 112.2 kg 112.6 kg  Weights ranging from 112-115.  No clinical signs of CHF 16. Prolonged QT interval  Avoiding medications which prolong LOS: 12 days A FACE TO FACE EVALUATION WAS PERFORMED  Erick Colace 03/07/2019, 9:29 AM

## 2019-03-07 NOTE — Plan of Care (Signed)
  Problem: RH BOWEL ELIMINATION Goal: RH STG MANAGE BOWEL WITH ASSISTANCE Description: STG Manage Bowel with  Min Assistance. Outcome: Progressing Goal: RH STG MANAGE BOWEL W/MEDICATION W/ASSISTANCE Description: STG Manage Bowel with Medication with Min  Assistance. Outcome: Progressing   Problem: RH BLADDER ELIMINATION Goal: RH STG MANAGE BLADDER WITH ASSISTANCE Description: STG Manage Bladder With Min Assistance Outcome: Progressing   Problem: RH SKIN INTEGRITY Goal: RH STG SKIN FREE OF INFECTION/BREAKDOWN Outcome: Progressing Goal: RH STG MAINTAIN SKIN INTEGRITY WITH ASSISTANCE Description: STG Maintain Skin Integrity With Min Assistance. Outcome: Progressing

## 2019-03-08 ENCOUNTER — Inpatient Hospital Stay (HOSPITAL_COMMUNITY): Payer: Medicare Other

## 2019-03-08 ENCOUNTER — Inpatient Hospital Stay (HOSPITAL_COMMUNITY): Payer: Medicare Other | Admitting: Occupational Therapy

## 2019-03-08 DIAGNOSIS — J9601 Acute respiratory failure with hypoxia: Secondary | ICD-10-CM

## 2019-03-08 DIAGNOSIS — J189 Pneumonia, unspecified organism: Secondary | ICD-10-CM

## 2019-03-08 LAB — CBC
HCT: 41.2 % (ref 39.0–52.0)
Hemoglobin: 12.8 g/dL — ABNORMAL LOW (ref 13.0–17.0)
MCH: 27.3 pg (ref 26.0–34.0)
MCHC: 31.1 g/dL (ref 30.0–36.0)
MCV: 87.8 fL (ref 80.0–100.0)
Platelets: 259 10*3/uL (ref 150–400)
RBC: 4.69 MIL/uL (ref 4.22–5.81)
RDW: 15.7 % — ABNORMAL HIGH (ref 11.5–15.5)
WBC: 13.7 10*3/uL — ABNORMAL HIGH (ref 4.0–10.5)
nRBC: 0 % (ref 0.0–0.2)

## 2019-03-08 LAB — BASIC METABOLIC PANEL
Anion gap: 11 (ref 5–15)
BUN: 30 mg/dL — ABNORMAL HIGH (ref 8–23)
CO2: 27 mmol/L (ref 22–32)
Calcium: 8.9 mg/dL (ref 8.9–10.3)
Chloride: 100 mmol/L (ref 98–111)
Creatinine, Ser: 1.34 mg/dL — ABNORMAL HIGH (ref 0.61–1.24)
GFR calc Af Amer: >60 mL/min (ref >60)
GFR calc non Af Amer: 53 mL/min — ABNORMAL LOW (ref >60)
Glucose, Bld: 94 mg/dL (ref 70–99)
Potassium: 3.9 mmol/L (ref 3.5–5.1)
Sodium: 138 mmol/L (ref 135–145)

## 2019-03-08 MED ORDER — MOMETASONE FURO-FORMOTEROL FUM 100-5 MCG/ACT IN AERO
2.0000 | INHALATION_SPRAY | Freq: Two times a day (BID) | RESPIRATORY_TRACT | Status: DC
  Filled 2019-03-08: qty 8.8

## 2019-03-08 NOTE — Progress Notes (Signed)
Occupational Therapy Session Note  Patient Details  Name: Kurt [name] MRN: [id number] Date of Birth: [birth year]  Today's Date: 03/08/2019 OT Individual Time: 2423-5361   & 1300-1404 OT Individual Time Calculation (min): 59 min &   64 min Missed 11 minutes of pm session due to fatigue  Short Term Goals: Week 2:  OT Short Term Goal 1 (Week 2): STG = LTG  Skilled Therapeutic Interventions/Progress Updates:    am session:    O2 sat at rest:  96% on 1L O2 sat with moderate activity:  >87% on 4L after 200' walk, later in session >90% on 3L after 200'walk  Patient seated in recliner, states that he is stiff and sore due to no therapy yesterday (Sunday)   Happy to participate in therapy today.  Sit to stand and ambulation with rollator at CS level t/o session - ambulated to/from therapy gym, participated in standing dynamic LB exercises, completed reaching tasks to 18" surface, 10" surface and then off of floor with CS/CGA - no LOB mild fatigue after each task.  Reviewed and practiced use of reacher and how to obtain for home use.  Completed UB shoulder exercises on 20 degree wedge - mild difficulty with fatigue, able to do scapular exercises with 1# weight.  Returned to recliner at close of session, on 1L O2 via [place] on wall system, call bell and tray table in reach.    Pm session: O2 sat at rest:  95% on 1L O2 sat with moderate activity:  87% on 3L after 120' walk, 93% on 3L after 10 minutes of NuStep (100 reps/minute), 90% on 3L after 120' walk  Patient seated in recliner - states that it has been a busy morning with little time for rest but he is eager to participate in therapy session.  Completed SPT and ambulation on unit with rollator to/from therapy gym spaces with CS.  Able to indicate and take rest breaks as needed t/o session.  Completed 10 minutes on Nustep, ambulation 120' x4 during session.  Reviewed and practiced use of rollator and reach and transport of items in kitchen environment  without AD - discussed when RW is appropriate for balance and to maximize endurance - he demonstrates good safety and awareness for walker management in the kitchen environment.  Discussed time and energy saving options for cooking/meal prep.  He returned to recliner at close of session with moderate fatigue noted - O2 sat as above.  Declined last 11 minutes due to fatigue, call bell and tray table in reach.    Therapy Documentation Precautions:  Precautions Precautions: Fall Precaution Comments: monitor O2 ( used 6 liters for activity and 3 at rest) Restrictions Weight Bearing Restrictions: No General:   Vital Signs: Therapy Vitals Temp: 97.7 F (36.5 C) Pulse Rate: 80 BP: 129/73 Patient Position (if appropriate): Lying Oxygen Therapy SpO2: 96 % O2 Device: Nasal Cannula   Other Treatments:     Therapy/Group: Individual Therapy  Barrie Lyme 03/08/2019, 7:36 AM

## 2019-03-08 NOTE — Progress Notes (Signed)
NAME:  Kurt [name], MRN:  [id number], DOB:  03/02/1950, LOS: 43 ADMISSION DATE:  02/23/2019, CONSULTATION DATE:  02/16/2019 REFERRING MD:  Danielle Rankin, CHIEF COMPLAINT: Recurrent  Pneumonia   Brief History   69 y.o. with recent COVID pneumonia (12/27/2018-12/16/ 2020) and recurrent pneumonia requiring several admission since original COVID infection. Most recent admission was 1/252021-02/23/2019. He was discharged to inpatient rehab 2/2.   Patient was admitted to the hospital from 12/6-12/16 for acute hypoxic respiratory failure secondary to COVID-19 pneumonia.  He was treated with steroids and remdesivir.  Discharged home on 2 L supplemental oxygen.  Admitted again on 12/28-1/2 for worsening dyspnea and hypoxia. CT angiogram done during this hospitalization negative for PE but did show 2 cavitary nodules in both upper lobes measuring up to 2.5 cm concerning for bacterial or fungal pneumonia, septic pulmonary emboli thought to be less likely.  His presentation was thought to be due to respiratory sequela from Covid and chronic sinusitis infection. Treated with Augmentin x 3 weeks for chronic sinusitis and a slow steroid taper for sequela of Covid pneumonia.  PCCM re-consulted 2/15 for complaints of dry hacking cough and preparation of discharge planning.    Past Medical History    Past Medical History:  Diagnosis Date  . Arthritis   . CHF (congestive heart failure) (HCC)    diastolic dysfunction  . Chronic back pain   . COVID-19   . Depression   . Diverticulitis   . Duodenal ulcer 05/2018   Treated with 3 clips/Dr. Henrene Pastor  . Fall off scafolding 2003   prolonged hospitalization- multiple BUE surgery, concussion  . GI bleed 05/2018  . GIB (gastrointestinal bleeding) 2016   Antral ulcer/duodenal bleed per Endoscopy by Dr. Benson Norway  . Hypertension   . PTSD (post-traumatic stress disorder)    after  a Fall   . Renal insufficiency    Decrease in GFR- PCP removed all NSAIDS & aspirin      Significant Hospital Events   November 2020>>COVID +  Admission 12/6>>12/16/ 2020  Admission 12/28>>01/23/2019 Admission 02/16/2019>>02/23/2019 (discharged to inpatient rehab   Consults:  02/16/2019>> Pulmonary 02/16/2019>> ID  Procedures:    Significant Diagnostic Tests:  02/16/2019>> CTA Worsening extensive bilateral ground-glass airspace opacities most compatible with pneumonia. 18 mm nodule in the right middle lobe. This nodule was 21 mm on prior study and partially cavitary. Mildly enlarged right paratracheal lymph nodes, stable. No evidence of pulmonary embolus. Cardiomegaly.  02/15/2019 CXR Diffuse interstitial prominence and patchy bilateral airspace disease, similar to prior study. Findings concerning for multifocal pneumonia  Micro Data:  02/16/2019 Sputum: GS: abundant gram-positive cocci, moderate gram-negative cocci bacilli, and few gram-negative rods.  1/26 COVID>> Negative 1/26 Respiratory Viral Panel >> Negative 1/28 AFB >> negative  1/28 BAL culture >> negative   Antimicrobials:  Maxipime 02/15/2019>> Unknown stop date ?2/2 Linezolid 02/15/2019>> Unknown stop date ?2/2  Interim history/subjective:  Patient seen sitting up in bedside recline stating he feel " 2,000% better" states he continues to have intermittent dry hacking cough but reports it is improved, No longer experiencing sore throat.   Objective   Blood pressure 129/73, pulse 80, temperature 97.7 F (36.5 C), resp. rate 18, height _0  (1.778 m), weight 112.6 kg, SpO2 96 %.        Intake/Output Summary (Last 24 hours) at 03/08/2019 0949 Last data filed at 03/08/2019 0912 Gross per 24 hour  Intake 1100 ml  Output 1475 ml  Net -375 ml   [employer]  03/03/19 0500 03/04/19 0700 03/07/19 0606  Weight: 115.4 kg 112.2 kg 112.6 kg   Physical Exam: General: Very pleasant elderly male sitting up In bedside recliner in NAD HEENT: [place]/AT, MM pink/moist, PERRL,  Neuro: Alert and oriented x3  CV:  s1s2 regular rate and rhythm, no murmur, rubs, or gallops,  PULM:  Clear to ascultation bilaterally, no added breath sounds, no increased work of breathing  GI: soft, bowel sounds active in all 4 quadrants, non-tender, non-distended Extremities: warm/dry, no  edema  Skin: no rashes or lesions  Resolved Hospital Problem list   Pseudomonas pneumonia vs. 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  Elevated inflammatory markers and lymphocytic BAL.  This has been complicated by severe muscular deconditioning from prolonged hospitalization.  RML lesion - warrants OP f/u in around 4 weeks, potential biopsy down the line.  AFB/fungal cultures negative.  Possibly related to pseudomonas OSA not on CPAP -wearing to some effect here, no immediate impact on morbidity.  Only real effect is that his O2 will be titrated up at night if he does not wear the CPAP.  Plan Continue supplemental oxygen as needed, 1L at rest 3-4 with activity  Trial of Dulera for post COVID cough, additional ICS has been shown to help with post COVID cough  Continue Long prednisone taper  Fever curve, WBC, and patient assessment reveal no acute signs of infection Encourage pulmonary hygiene   Continue scheduled bronchodilators  Encourage oral intake  Continue use of CPAP at night  Continue Flonase, patient reports resolution of sore throat  Will need repeat Chest CT around 2/26 for RML nodule F/U  F/U with Dr. Cordella Register Pulmonology 03/17/2019 at 9:30  Best practice:  Diet: Cardiac  Pain/Anxiety/Delirium protocol (if indicated): PRNs VAP protocol (if indicated): N/A DVT prophylaxis: Lovenox  GI prophylaxis: PPI Glucose control: Monitor  Mobility: Aggressive rhab  Code Status: Full Family Communication: Updated by primary  Disposition: Rehab   Labs   CBC: Recent Labs  Lab 03/02/19 0536 03/03/19 0534 03/08/19 0633  WBC 13.9*  13.5* 13.7*  NEUTROABS  --  11.1*  --   HGB 12.0* 12.1* 12.8*  HCT 38.5* 38.2* 41.2  MCV 87.3 86.8 87.8  PLT 328 333 354    Basic Metabolic Panel: Recent Labs  Lab 03/02/19 0536 03/03/19 0534 03/08/19 0633  NA 136 137 138  K 4.0 4.3 3.9  CL 97* 98 100  CO2 _0 GLUCOSE 98 98 94  BUN 31* 32* 30*  CREATININE 1.52* 1.34* 1.34*  CALCIUM 9.1 9.0 8.9   GFR: Estimated Creatinine Clearance: 64.4 mL/min (A) (by C-G formula based on SCr of 1.34 mg/dL (H)). Recent Labs  Lab 03/02/19 0536 03/03/19 0534 03/08/19 0633  WBC 13.9* 13.5* 13.7*    Liver Function Tests: No results for input(s): AST, ALT, ALKPHOS, BILITOT, PROT, ALBUMIN in the last 168 hours. No results for input(s): LIPASE, AMYLASE in the last 168 hours. No results for input(s): AMMONIA in the last 168 hours.  ABG    Component Value Date/Time   TCO2 29 07/08/2012 1416     Coagulation Profile: No results for input(s): INR, PROTIME in the last 168 hours.  Cardiac Enzymes: No results for input(s): CKTOTAL, CKMB, CKMBINDEX, TROPONINI in the last 168 hours.  HbA1C: Hgb A1c MFr Bld  Date/Time Value Ref Range Status  06/15/2018 09:00 AM 5.9 (H) 4.8 - 5.6 % Final  Comment:    (NOTE)         Prediabetes: 5.7 - 6.4         Diabetes: >6.4         Glycemic control for adults with diabetes: <7.0   07/09/2012 05:40 AM 5.5 <5.7 % Final    Comment:    (NOTE)                                                                       According to the ADA Clinical Practice Recommendations for 2011, when HbA1c is used as a screening test:  >=6.5%   Diagnostic of Diabetes Mellitus           (if abnormal result is confirmed) 5.7-6.4%   Increased risk of developing Diabetes Mellitus References:Diagnosis and Classification of Diabetes Mellitus,Diabetes TMYT,1173,56(POLID 1):S62-S69 and Standards of Medical Care in         Diabetes - 2011,Diabetes CVUD,3143,88 (Suppl 1):S11-S61.    CBG: No results for input(s):  GLUCAP in the last 168 hours.     Critical care time:   Johnsie Cancel, NP-C [facility] Contact / Pager information can be found on Amion  03/08/2019, 10:53 AM

## 2019-03-08 NOTE — Progress Notes (Signed)
Physical Therapy Session Note  Patient Details  Name: Kurt [name] MRN: [id number] Date of Birth: 1950-12-04  Today's Date: 03/08/2019 PT Individual Time: 1115-1210 PT Individual Time Calculation (min): 55 min   Short Term Goals: Week 2:  PT Short Term Goal 1 (Week 2): STG=LTG due to ELOS.  Skilled Therapeutic Interventions/Progress Updates:     Patient in recliner in room on 1L/min upon PT arrival. Patient alert and agreeable to PT session. Patient denied pain during session.   Therapeutic Activity: Transfers: Patient performed sit to/from stand x6 using a rollator with supervision. Provided verbal cues for locking breaks x1.  Patient performed a simulated truck height with step-up to simulate running board transfer with CGA on the passenger and drivers side using SPC. Provided cues for safe technique, hand placement and sequencing with SPC.  Gait Training:  Patient ambulated 195 feet, 120 feet, 190 feet, and 110 feet using a rollator supvision for safety and O2 management on 3L/min. Ambulated with decreased gait speed, decreased step length and height, trendelenburg gait, and mild forward trunk lean. Provided verbal cues for paced breathing and stepping to manage O2 sats and endurance throughout. O2 sats dropped to 89-92% after each trial and recovered to >96% in <2 min with pursed lip breathing on 3L/min O2. He performed a simulated home entrance set up with patient using a SPC ambulating 6 feet, using a 4" platform with a chair on the R, ambulating 10 feet to stairs, up/down 8 steps 1 rail then up/down 7" platform with CGA for safety and min cues for sequencing. Performed on 3L/min, O2 sats 91% and HR 116 after, recovered to 97% and HR 95 in <2 min.   Patient in recliner with RN in room to provide breathing treatment at end of session with breaks locked and all needs within reach.    Therapy Documentation Precautions:  Precautions Precautions: Fall Precaution Comments: monitor O2  ( used 6 liters for activity and 3 at rest) Restrictions Weight Bearing Restrictions: No General: PT Amount of Missed Time (min): 5 Minutes PT Missed Treatment Reason: Other (Comment)(Patient meeting with pulmonary NP)    Therapy/Group: Individual Therapy  Jearldean Gutt L Rayne Cowdrey PT, DPT  03/08/2019, 12:43 PM

## 2019-03-08 NOTE — Progress Notes (Addendum)
Social Work Patient ID: Kurt [name], male   DOB: [birth year], 69 y.o.   MRN: [id number]    SW met with pt in room to follow-up about preferred outpatient location. Pt prefers an outpatient [facility]. SW informed pt will order 3in1 BSC. Pt has ordered TTB and expected arrival for Tuesday.   SW faxed Outpatient PT/OT order to [facility](p:[phone]/[phone]). When calling to confirm, location closed due to power outage and will reopen on 2/16. SW will follow-up.   DME order sent to [facility] (p:[phone]/[phone]).  Loralee Pacas, MSW, [place] Office: [phone] Cell: [phone] Fax: [phone]

## 2019-03-08 NOTE — Progress Notes (Signed)
[facility] PHYSICAL MEDICINE & REHABILITATION PROGRESS NOTE   Subjective/Complaints:  Pt reports still at 1L at rest and 3-4L with activity. Still having a dry hacking cough that's really irritating- not bringing anything up.   Took albuterol nebs yesterday- still feel like had much of a difference even in short term with nebs- didn't improve cough, etc.   Also c/o post nasal drip. Already on Flonase 2sprays daily.   D/C day is Thursday, he can't wait.    ROS; pt denies CP, abd pain, N/V/C/D, Headache, vision changes- denies SOB and sore throat except a little at night.    Objective:   No results found. Recent Labs    03/08/19 0633  WBC 13.7*  HGB 12.8*  HCT 41.2  PLT 259   Recent Labs    03/08/19 0633  NA 138  K 3.9  CL 100  CO2 27  GLUCOSE 94  BUN 30*  CREATININE 1.34*  CALCIUM 8.9    Intake/Output Summary (Last 24 hours) at 03/08/2019 0926 Last data filed at 03/08/2019 0912 Gross per 24 hour  Intake 1100 ml  Output 1475 ml  Net -375 ml     Physical Exam: Vital Signs Blood pressure 129/73, pulse 80, temperature 97.7 F (36.5 C), resp. rate 18, height 5\' 10"  (1.778 m), weight 112.6 kg, SpO2 96 %.  Labs reviewed; last notes reviewed Awake, alert, appropriate, sitting on mat in gym with PT; at rest; on 1L currently; NAD HENT:  Head: Normocephalic and atraumatic.  Mouth/Throat: mild erythema back of oropharynx Eyes: conjugate gaze Neck: No tracheal deviation present.  Cardiovascular: RRR no M/R/G Respiratory: CTA B/L no wheezing but does have a dry cough with deeper inspiration GI: Soft, NT, ND, (+)BS Musculoskeletal:        Comments: UEs- deltoid 3-/5 B/L; 5-/5 in arms biceps, triceps, WE, grip, finger abd B/L LEs- HF, KE, KF, DF, PF all 5-/5 B/L, unchanged Neurological: He is alert and oriented to person, place, and time. Decreased sensation to light touch in LEs; and LUE to elbow has decreased sensation to light touch.  Intact sensation in RUE.   Skin: Skin is warm and dry.  Bruise on knee  Psychiatric:  Laughing and appropriate this AM         Assessment/Plan: 1. Functional deficits secondary to debility from 3rd case of pneumonia since COVID 12/20- currently pseudomonas pneumonia which require 3+ hours per day of interdisciplinary therapy in a comprehensive inpatient rehab setting.  Physiatrist is providing close team supervision and 24 hour management of active medical problems listed below.  Physiatrist and rehab team continue to assess barriers to discharge/monitor patient progress toward functional and medical goals  Care Tool:  Bathing  Bathing activity did not occur: Safety/medical concerns(declined this session due to fatigue) Body parts bathed by patient: Right arm, Left arm, Chest, Abdomen, Front perineal area, Buttocks, Right upper leg, Left upper leg, Right lower leg, Left lower leg, Face         Bathing assist Assist Level: Set up assist     Upper Body Dressing/Undressing Upper body dressing   What is the patient wearing?: Pull over shirt    Upper body assist Assist Level: Set up assist    Lower Body Dressing/Undressing Lower body dressing      What is the patient wearing?: Pants, Underwear/pull up     Lower body assist Assist for lower body dressing: Set up assist     Toileting Toileting    Toileting assist Assist  for toileting: Supervision/Verbal cueing Assistive Device Comment: urinal   Transfers Chair/bed transfer  Transfers assist     Chair/bed transfer assist level: Supervision/Verbal cueing Chair/bed transfer assistive device: Other(rollator)   Locomotion Ambulation   Ambulation assist      Assist level: Contact Guard/Touching assist Assistive device: Rollator Max distance: 220   Walk 10 feet activity   Assist     Assist level: Contact Guard/Touching assist Assistive device: Rollator   Walk 50 feet activity   Assist    Assist level: Contact  Guard/Touching assist Assistive device: Rollator    Walk 150 feet activity   Assist Walk 150 feet activity did not occur: Safety/medical concerns  Assist level: Contact Guard/Touching assist Assistive device: Rollator    Walk 10 feet on uneven surface  activity   Assist Walk 10 feet on uneven surfaces activity did not occur: Safety/medical concerns(decreased activity tolerance/balance)         Wheelchair     Assist Will patient use wheelchair at discharge?: No Type of Wheelchair: Manual    Wheelchair assist level: Supervision/Verbal cueing Max wheelchair distance: 150'    Wheelchair 50 feet with 2 turns activity    Assist        Assist Level: Supervision/Verbal cueing   Wheelchair 150 feet activity     Assist      Assist Level: Supervision/Verbal cueing   Blood pressure 129/73, pulse 80, temperature 97.7 F (36.5 C), resp. rate 18, height 5\' 10"  (1.778 m), weight 112.6 kg, SpO2 96 %.  1.  Impaired mobility, function, and ADLs secondary to debility from  Pseudomonas pneumonia s/p 3rd recent hospitalization for pneumonia- initial hospital stay for COVID pneumonia.    Reduced O2 requirements, still with dry cough we will schedule his albuterol and monitor  2/15- no improvement with Albuterol- still on Prednisone- have seen this dry hacking cough in many post COVID patients- will see/look into- if there's any other treatment; albuterol NOT working.              -ELOS/Goals: 2- 2.5 weeks; goals mod I  Continue CIR PT and OT, progressing well.  2.  Antithrombotics: -DVT/anticoagulation:  Pharmaceutical: Lovenox             -antiplatelet therapy: N/a 3. Chronic back pain/Pain Management: Hydrocodone prn  -well controlled; not requiring Hydrocodone.   4. H/o depression/Mood: team to provide ego support and help with anxiety.              -antipsychotic agents: N/A 5. Neuropsych: This patient is capable of making decisions on his own behalf. 6.  Skin/Wound Care: routine pressure relief measures.  7. Fluids/Electrolytes/Nutrition: Monitor I/O.  Fluid intake is marginal, 860 mL yesterday   10. OSA: Encourage CPAP use at nights.  Patient states that he has lost about 50 pounds since his illness.  2/15- refusing CPAP- will have him f/u sleep clinic outpt to see if still needs 11. Pharyngeal mucositis: MMW Qid (was TID) and Nystatin mouth wash qid and trial of nebulized lidocaine added on  2/1. Will also add Diflucan 200 mg x 1 and 100 daily x 6 days.  2/4- added Flonase for sore throat   2/5- improved on exam and by hx   2/15- still a mild issue- will con't Nystatin moutwash 12. CKD Stage IIIa: Monitor BMET with serial checks.   2/3- Cr 1.12 and BUN slightly elevated at 25- a little dry- will encourage fluid intake.   2/6- recheck Monday   2/8: See #  7  2/9- reviewed labs- Cr up to 1.52 from 1.39- BUN u to 31; will give IVFs 75 cc/hr x 12 hours and recheck in AM  2/10- gave IVFs 2/9- Cr dropped from 1.52 to 1.34 but BUN still 32- will allow PO intake and recheck Friday since sitl elevated.  2/12- ordered labs- not back- will monitor- will recheck Monday  2/15- Cr stable at 1.34- which is his baseline due to CKD StageIIIa. con't to encourage fluids- order written.   13. H/o recurrent GIB/chronic gastritis 05/2018: PPI recommended indefinitely-- will resume with recent use of NSAIDs as well as steroids on board.  14. Chronic diastolic CHF- will monitor daily weights and treat as required.   2/3- will order daily weights   Filed Weights   03/03/19 0500 03/04/19 0700 03/07/19 0606  Weight: 115.4 kg 112.2 kg 112.6 kg  Weights ranging from 112-115.  No clinical signs of CHF 16. Prolonged QT interval  Avoiding medications which prolong 17. Post nasal drip  2/15- on Flonase 2 sprays daily- which is max dose- will suggest Rhinocort at d/c.    LOS: 13 days A FACE TO FACE EVALUATION WAS PERFORMED  Negin Hegg 03/08/2019, 9:26 AM

## 2019-03-09 ENCOUNTER — Inpatient Hospital Stay (HOSPITAL_COMMUNITY): Payer: Medicare Other

## 2019-03-09 ENCOUNTER — Inpatient Hospital Stay (HOSPITAL_COMMUNITY): Payer: Medicare Other | Admitting: Occupational Therapy

## 2019-03-09 MED ORDER — HYDROCOD POLST-CPM POLST ER 10-8 MG/5ML PO SUER: 5.0000 mL | Freq: Two times a day (BID) | ORAL | Status: DC | PRN

## 2019-03-09 NOTE — Progress Notes (Signed)
Occupational Therapy Session Note  Patient Details  Name: Kurt [name] MRN: [id number] Date of Birth: 21-Dec-1950  Today's Date: 03/09/2019 OT Individual Time: 1030-1100 OT Individual Time Calculation (min): 30 min    Short Term Goals: Week 2:  OT Short Term Goal 1 (Week 2): STG = LTG  Skilled Therapeutic Interventions/Progress Updates:    Treatment session with focus on activity tolerance during self-care tasks.  Pt received upright in recliner reporting frustration with hospital meals.  Pt expressing desire to shower.  Pt ambulated to room shower with Rollator Mod I.  Completed all bathing and dressing at sit > stand level in room shower at Mod I level.  Pt reports removing O2 intermittently during shower, reporting no increase in WOB.  O2 sats 94% on 3L, HR 106 post shower.  Completed oral care in standing.  Returned to recliner, pt reporting feeling much better after shower.  Pt reports ordering tub bench for home for energy conservation during bathing.    Therapy Documentation Precautions:  Precautions Precautions: Fall Precaution Comments: monitor O2 ( keep >88%, 1L at rest & 3L with moderate activity) Restrictions Weight Bearing Restrictions: No General:   Vital Signs: Therapy Vitals Temp: 98.8 F (37.1 C) Pulse Rate: 99 Resp: 18 BP: 130/77 Patient Position (if appropriate): Sitting Oxygen Therapy SpO2: 95 % O2 Device: Nasal Cannula O2 Flow Rate (L/min): 1.5 L/min Pain: Pain Assessment Pain Scale: 0-10 Pain Score: 0-No pain Faces Pain Scale: No hurt   Therapy/Group: Individual Therapy  Rosalio Loud 03/09/2019, 3:23 PM

## 2019-03-09 NOTE — Progress Notes (Signed)
Occupational Therapy Discharge Summary  Patient Details  Name: Kurt [name] MRN: [id number] Date of Birth: 27-Jul-1950    Patient has made significant gains in all functional areas due to increased endurance, proximal strength and balance.  He is able to perform moderately strenuous activities on 3L of O2 and is using 1L at rest to maintain saturation >88%. Patient has met 5 of 5 long term goals due to improved activity tolerance, improved balance, postural control and improved coordination.  Patient to discharge at overall Modified Independent level.    Reasons goals not met: na  Recommendation:  Patient will benefit from ongoing skilled OT services in outpatient setting to continue to advance functional skills in the area of BADL and iADL.  Equipment: tub transfer bench, commode  Reasons for discharge: treatment goals met  Patient/family agrees with progress made and goals achieved: Yes  OT Discharge Precautions/Restrictions  Precautions Precautions: Fall Precaution Comments: monitor O2 ( keep >88%, 1L at rest & 3L with moderate activity) Restrictions Weight Bearing Restrictions: No General   Vital Signs Therapy Vitals Temp: 98.8 F (37.1 C) Pulse Rate: 99 Resp: 18 BP: 130/77 Patient Position (if appropriate): Sitting Oxygen Therapy SpO2: 95 % O2 Device: Nasal Cannula O2 Flow Rate (L/min): 1.5 L/min Pain Pain Assessment Pain Scale: 0-10 Pain Score: 0-No pain Faces Pain Scale: No hurt ADL ADL Eating: Independent Grooming: Independent Upper Body Bathing: Independent Where Assessed-Upper Body Bathing: Shower Lower Body Bathing: Independent Where Assessed-Lower Body Bathing: Shower Upper Body Dressing: Independent Where Assessed-Upper Body Dressing: Chair Lower Body Dressing: Modified independent Where Assessed-Lower Body Dressing: Chair Toileting: Independent Where Assessed-Toileting: Glass blower/designer: Diplomatic Services operational officer Method:  Counselling psychologist: Radiographer, therapeutic: Modified independent Clinical cytogeneticist Method: Optometrist: Facilities manager: Modified independent Social research officer, government Method: Heritage manager: Radio broadcast assistant Vision Baseline Vision/History: Wears glasses Wears Glasses: At all times Patient Visual Report: No change from baseline Vision Assessment?: No apparent visual deficits Cognition Overall Cognitive Status: Within Functional Limits for tasks assessed Arousal/Alertness: Awake/alert Orientation Level: Oriented X4 Sustained Attention: Appears intact Safety/Judgment: Appears intact Sensation Sensation Light Touch: Appears Intact Motor  Motor Motor - Discharge Observations: proximal / trunk and scapular weakness Mobility  Bed Mobility Rolling Right: Independent Rolling Left: Independent Supine to Sit: Independent Sit to Supine: Independent Transfers Sit to Stand: Independent with assistive device Stand to Sit: Independent with assistive device  Trunk/Postural Assessment  Postural Control Postural Control: Within Functional Limits  Balance Standardized Balance Assessment Standardized Balance Assessment: Berg Balance Test Berg Balance Test Sit to Stand: Able to stand without using hands and stabilize independently Standing Unsupported: Able to stand safely 2 minutes Sitting with Back Unsupported but Feet Supported on Floor or Stool: Able to sit safely and securely 2 minutes Stand to Sit: Sits safely with minimal use of hands Transfers: Able to transfer safely, minor use of hands Standing Unsupported with Eyes Closed: Able to stand 10 seconds safely Standing Ubsupported with Feet Together: Able to place feet together independently and stand 1 minute safely From Standing, Reach Forward with Outstretched Arm: Can reach confidently >25 cm (10") From Standing Position, Pick up  Object from Floor: Able to pick up shoe safely and easily From Standing Position, Turn to Look Behind Over each Shoulder: Looks behind from both sides and weight shifts well Turn 360 Degrees: Able to turn 360 degrees safely in 4 seconds or less Standing Unsupported, Alternately Place Feet on Step/Stool:  Able to complete >2 steps/needs minimal assist Standing Unsupported, One Foot in Front: Able to plae foot ahead of the other independently and hold 30 seconds Standing on One Leg: Tries to lift leg/unable to hold 3 seconds but remains standing independently Total Score: 49 Dynamic Sitting Balance Dynamic Sitting - Level of Assistance: 7: Independent Static Standing Balance Static Standing - Level of Assistance: 6: Modified independent (Device/Increase time) Dynamic Standing Balance Dynamic Standing - Level of Assistance: 6: Modified independent (Device/Increase time) Extremity/Trunk Assessment RUE Assessment General Strength Comments: old injury- shoulder 0-100, limited full elbow extension- able to perform ADL tasks without difficulty- has learn to accomodate. AROM in supine full, 3/4 proximal OH reach in sitting LUE Assessment General Strength Comments: PROM WFL, AROM in supine full OH flexion and abd, proximal weakness/scapular instability limit OH reach in sitting able to complete full flexion/abduction after facilitation but fatigues quickly   [employer] 03/09/2019, 3:22 PM

## 2019-03-09 NOTE — Progress Notes (Signed)
Occupational Therapy Session Note  Patient Details  Name: Kurt [name] MRN: [id number] Date of Birth: 1950-02-01  Today's Date: 03/09/2019 OT Individual Time: 1445-1555 OT Individual Time Calculation (min): 70 min    Short Term Goals: Week 2:  OT Short Term Goal 1 (Week 2): STG = LTG  Skilled Therapeutic Interventions/Progress Updates:    Upon entering the room, pt seated in recliner chair with no c/o pain. Pt does report fatigue and recounts activity with scheduled therapy today. OT continued education on energy conservation for self care and community mobility tasks. Pt very active participant in education and reports feeling prepared for discharge home. Pt engaged in B UE strengthening exercises with use of level 3 resistive theraband. Pt needing min cuing for pursed lip breathing and O2 saturation no lower than 93% on 1 L of O2 via [place]. Pt's HR did increase to 114 bpm with exercise. Pt modifying exercises as needed for L UE. Pt reporting no other concerns at this time. Pt remained in recliner chair with call bell and all needed items within reach.   Therapy Documentation Precautions:  Precautions Precautions: Fall Precaution Comments: monitor O2 ( keep >88%, 1L at rest & 3L with moderate activity) Restrictions Weight Bearing Restrictions: No General:   Vital Signs: Therapy Vitals Temp: 98.8 F (37.1 C) Pulse Rate: 99 Resp: 18 BP: 130/77 Patient Position (if appropriate): Sitting Oxygen Therapy SpO2: 95 % O2 Device: Nasal Cannula O2 Flow Rate (L/min): 1.5 L/min Pain: Pain Assessment Pain Scale: 0-10 Pain Score: 0-No pain Faces Pain Scale: No hurt ADL: ADL Eating: Independent Grooming: Independent Upper Body Bathing: Independent Where Assessed-Upper Body Bathing: Shower Lower Body Bathing: Independent Where Assessed-Lower Body Bathing: Shower Upper Body Dressing: Independent Where Assessed-Upper Body Dressing: Chair Lower Body Dressing: Modified independent Where  Assessed-Lower Body Dressing: Chair Toileting: Independent Where Assessed-Toileting: Teacher, adult education: Engineer, agricultural Method: Proofreader: Gaffer: Modified independent Web designer Method: Ship broker: Insurance underwriter: Modified independent Film/video editor Method: Designer, industrial/product: Emergency planning/management officer Vision Baseline Vision/History: Wears glasses Wears Glasses: At all times Patient Visual Report: No change from baseline Vision Assessment?: No apparent visual deficits   Therapy/Group: Individual Therapy  Alen Bleacher 03/09/2019, 4:37 PM

## 2019-03-09 NOTE — Progress Notes (Signed)
Social Work Patient ID: Kurt [name], male   DOB: 23-Jul-1950, 69 y.o.   MRN: [id number]   SW spoke with [employer] (p:[phone]/f:[phone]) to confirm order received, and when to anticipate delivery of item to pt home. Confirms order received and informed that it will be delivered today and left on porch. SW discussed with pt on above. Pt reports that his son will stop by this afternoon and pick up item. SW asked pt to inform SW if there are any issues.   SW verified with [facility]([phone]/f:[phone]) that referral was received, and will follow-up with pt. SW will receive follow-up only if there is an issue. Cecile Sheerer, MSW, LCSWA Office: [phone] Cell: [phone] Fax: [phone]

## 2019-03-09 NOTE — Patient Care Conference (Signed)
Inpatient RehabilitationTeam Conference and Plan of Care Update Date: 03/09/2019   Time: 11:40 AM   Patient Name: Kurt [name]      Medical Record Number: [id number]  Date of Birth: 19-Jul-1950 Sex: Male         Room/Bed: 4W05C/4W05C-01 Payor Info: Payor: BLUE CROSS BLUE SHIELD MEDICARE / Plan: BCBS MEDICARE / Product Type: *No Product type* /    Admit Date/Time:  02/23/2019  4:46 PM  Primary Diagnosis:  Physical debility  Patient Active Problem List   Diagnosis Date Noted  . Diastolic CHF (HCC) 02/24/2019  . Physical debility 02/23/2019  . COVID-19   . Cavitary pneumonia 02/17/2019  . Multifocal pneumonia 02/16/2019  . Diarrhea 02/16/2019  . Respiratory failure, acute (HCC) 02/16/2019  . Pulmonary nodule   . OSA (obstructive sleep apnea) 01/20/2019  . Acute renal failure superimposed on stage 3a chronic kidney disease (HCC) 01/20/2019  . Chronic sinusitis 01/20/2019  . Acute on chronic respiratory failure (HCC) 01/18/2019  . Failure to thrive in adult 01/18/2019  . Major depression, chronic 01/18/2019  . Obesity, Class III, BMI 40-49.9 (morbid obesity) (HCC) 01/18/2019  . History of COVID-19 12/27/2018  . Acute respiratory failure with hypoxia (HCC) 12/27/2018  . CKD (chronic kidney disease) stage 3, GFR 30-59 ml/min 12/27/2018  . CHF (congestive heart failure) (HCC) 12/27/2018  . QT prolongation 06/15/2018  . 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 GI bleed 04/29/2014  . Acute kidney injury superimposed on CKD (HCC) 04/29/2014  . TIA (transient ischemic attack) 07/08/2012  . Diverticulitis 07/08/2012  . Hypertension 07/08/2012    Expected Discharge Date: Expected Discharge Date: 03/11/19  Team Members Present: Physician leading conference: Dr. Genice Rouge Social Worker Present: Amada Jupiter, LCSW(Auria Lula Ols[place], [place]) Nurse Present: Doran Durand, LPN Case Manager: Roderic Palau, RN PT Present: Serina Cowper, PT OT Present: Towanda Malkin, OT PPS Coordinator present : Fae Pippin, SLP     Current Status/Progress Goal Weekly Team Focus  Bowel/Bladder   Pt continent of B/B. LBM 2/15  remain continent  assist as need with toileting.   Swallow/Nutrition/ Hydration             ADL's   Mod I adl, CS functional transfers and mobility, 1L O2 at rest, 3L O2 with moderate activity  mod I  discharge planning, endurance, functional mobility/HM   Mobility   Supervision-mod I using a rollator for transfers, gait >150 feet, CGA-close supervision 8 steps 1 rail and SPC.  Mod-close supervision overall  Activity tolerance, functional mobility, activity tolerance, d/c planning, gait and stair training, patient/caregiver education   Communication             Safety/Cognition/ Behavioral Observations            Pain   no c/o pain at this time.  pain sclae <3/10  assess and treat pain Qshift/PRN   Skin   generalized bruising.  no skin break down  assess skin qshift/PRN    Rehab Goals Patient on target to meet rehab goals: Yes *See Care Plan and progress notes for long and short-term goals.     Barriers to Discharge  Current Status/Progress Possible Resolutions Date Resolved   Nursing                  PT  Home environment access/layout  Needs to have supervision for home entry/exit due to several stairs.  OT                  SLP                SW Lack of/limited family support;Decreased caregiver support Pt will have intermittenet support in which family will check in on pt after work. Pt will transport self to/from medical appointments.            Discharge Planning/Teaching Needs:  Pt to d/c to home with intermittent support from children. Pt will need to be independent at d/c.  Family education per therapy recommendations.   Team Discussion: Post Covid, PNA X 2, decreasing O2, tussinex for cough.  RN cont B/B, headache, given tylenol,  on 1 L O2.  OT shower/dressing mod I, S mobility, monitoring O2, 3L with activity, 1L resting.  PT I bed, S/mod I transfers, amb 150'-200', CGA/close S stairs on O2 3L, has mod I goals in home, S community and entry into home.  Has intermittent S in evenings for support.   Revisions to Treatment Plan: N/A     Medical Summary Current Status: continent of B/B- headaches- uses tylenol; 1L at rest; O2; skin no issues Weekly Focus/Goal: OT- shower mod I; supervision to moniotr O2 levels- 3L with activity- 1L at rest; ready to d/c.  Barriers to Discharge: Home enviroment access/layout;New oxygen;Medical stability;Weight;Other (comments)  Barriers to Discharge Comments: post COVID- new O2 Possible Resolutions to Barriers: walking 150-200 ft RW supervision; 7 STE- CGA-close SBA for getting into home; drops to ~ 90%; goals mod I in home   Continued Need for Acute Rehabilitation Level of Care: The patient requires daily medical management by a physician with specialized training in physical medicine and rehabilitation for the following reasons: Direction of a multidisciplinary physical rehabilitation program to maximize functional independence : Yes Medical management of patient stability for increased activity during participation in an intensive rehabilitation regime.: Yes Analysis of laboratory values and/or radiology reports with any subsequent need for medication adjustment and/or medical intervention. : Yes   I attest that I was present, lead the team conference, and concur with the assessment and plan of the team.   Kurt [name] 03/09/2019, 8:52 PM   Team conference was held via web/ teleconference due to [place] - 19

## 2019-03-09 NOTE — Progress Notes (Signed)
[place] PHYSICAL MEDICINE & REHABILITATION PROGRESS NOTE   Subjective/Complaints:  Pt was trying to get off O2 today with a timed test of seated activity- tried without O2 for 5 minutes- pt's O2 levels dropped to 83%.  Pt reported that he still needed O2, and felt SOB when sats dropped to 83%. Bowels working well, per pt;  Cough is still a major issue- dry hacking cough. Can't get anything up.    ROS; pt denies CP, abd pain, N/V/C/D, Headache, vision changes- denies SOB and sore throat except a little at night.    Objective:   DG Chest 2 View  Result Date: 03/08/2019 CLINICAL DATA:  History of COVID-19 positivity with persistent cough EXAM: CHEST - 2 VIEW COMPARISON:  02/16/2019 FINDINGS: Cardiac shadow is mildly enlarged but stable. Diffuse bilateral opacities are again identified consistent with the patient's given clinical history. These are slightly improved when compared with the prior CT examination. No sizable effusion is noted. Degenerative changes of the thoracic spine are seen. IMPRESSION: Diffuse bilateral opacities consistent with the given clinical history although slightly improved from the prior CT examination. Electronically Signed   By: Inez Catalina M.D.   On: 03/08/2019 12:35   Recent Labs    03/08/19 0633  WBC 13.7*  HGB 12.8*  HCT 41.2  PLT 259   Recent Labs    03/08/19 0633  NA 138  K 3.9  CL 100  CO2 27  GLUCOSE 94  BUN 30*  CREATININE 1.34*  CALCIUM 8.9    Intake/Output Summary (Last 24 hours) at 03/09/2019 0837 Last data filed at 03/09/2019 0750 Gross per 24 hour  Intake 920 ml  Output 665 ml  Net 255 ml     Physical Exam: Vital Signs Blood pressure (!) 142/88, pulse 85, temperature (!) 97.5 F (36.4 C), temperature source Oral, resp. rate 20, height 5\' 10"  (1.778 m), weight 112.6 kg, SpO2 93 %.  Labs reviewed; vitals reviewed; in room sitting, but keeping moving, with OT at bedside, constantly wiggling and turning for test ;  NAD HENT:  Head: Normocephalic and atraumatic.  Mouth/Throat: mild erythema back of oropharynx Eyes: conjugate gaze Neck: No tracheal deviation present.  Cardiovascular: RRR no M/R/G Respiratory: CTA B/L no wheezing but does have a dry cough with deeper inspiration GI: Soft, NT, ND, (+)BS Musculoskeletal:        Comments: UEs- deltoid 3-/5 B/L; 5-/5 in arms biceps, triceps, WE, grip, finger abd B/L LEs- HF, KE, KF, DF, PF all 5-/5 B/L, unchanged Neurological: He is alert and oriented to person, place, and time. Decreased sensation to light touch in LEs; and LUE to elbow has decreased sensation to light touch.  Intact sensation in RUE.  Skin: Skin is warm and dry.  Bruise on knee  Psychiatric:  Appropriate this AM; very serious         Assessment/Plan: 1. Functional deficits secondary to debility from 3rd case of pneumonia since COVID 12/20- currently pseudomonas pneumonia which require 3+ hours per day of interdisciplinary therapy in a comprehensive inpatient rehab setting.  Physiatrist is providing close team supervision and 24 hour management of active medical problems listed below.  Physiatrist and rehab team continue to assess barriers to discharge/monitor patient progress toward functional and medical goals  Care Tool:  Bathing  Bathing activity did not occur: Safety/medical concerns(declined this session due to fatigue) Body parts bathed by patient: Right arm, Left arm, Chest, Abdomen, Front perineal area, Buttocks, Right upper leg, Left upper leg, Right  lower leg, Left lower leg, Face         Bathing assist Assist Level: Set up assist     Upper Body Dressing/Undressing Upper body dressing   What is the patient wearing?: Pull over shirt    Upper body assist Assist Level: Set up assist    Lower Body Dressing/Undressing Lower body dressing      What is the patient wearing?: Pants, Underwear/pull up     Lower body assist Assist for lower body dressing:  Set up assist     Toileting Toileting    Toileting assist Assist for toileting: Supervision/Verbal cueing Assistive Device Comment: urinal   Transfers Chair/bed transfer  Transfers assist     Chair/bed transfer assist level: Supervision/Verbal cueing Chair/bed transfer assistive device: Other(rollator)   Locomotion Ambulation   Ambulation assist      Assist level: Contact Guard/Touching assist Assistive device: Rollator Max distance: 220   Walk 10 feet activity   Assist     Assist level: Contact Guard/Touching assist Assistive device: Rollator   Walk 50 feet activity   Assist    Assist level: Contact Guard/Touching assist Assistive device: Rollator    Walk 150 feet activity   Assist Walk 150 feet activity did not occur: Safety/medical concerns  Assist level: Contact Guard/Touching assist Assistive device: Rollator    Walk 10 feet on uneven surface  activity   Assist Walk 10 feet on uneven surfaces activity did not occur: Safety/medical concerns(decreased activity tolerance/balance)         Wheelchair     Assist Will patient use wheelchair at discharge?: No Type of Wheelchair: Manual    Wheelchair assist level: Supervision/Verbal cueing Max wheelchair distance: 150'    Wheelchair 50 feet with 2 turns activity    Assist        Assist Level: Supervision/Verbal cueing   Wheelchair 150 feet activity     Assist      Assist Level: Supervision/Verbal cueing   Blood pressure (!) 142/88, pulse 85, temperature (!) 97.5 F (36.4 C), temperature source Oral, resp. rate 20, height 5\' 10"  (1.778 m), weight 112.6 kg, SpO2 93 %.  1.  Impaired mobility, function, and ADLs secondary to debility from  Pseudomonas pneumonia s/p 3rd recent hospitalization for pneumonia- initial hospital stay for COVID pneumonia.    Reduced O2 requirements, still with dry cough we will schedule his albuterol and monitor  2/15- no improvement with  Albuterol- still on Prednisone- have seen this dry hacking cough in many post COVID patients- will see/look into- if there's any other treatment; albuterol NOT working.   2/16- will try Tussoniex with hydrocodone q12 hours prn per IM and switch to Delsym at d/c; also they recommended reducing Prednisone to 40 mg at d/c and taper to off over 2 weeks. Will have to go home on O2, but should be able to wean off In 1-2 months completely             -ELOS/Goals: 2- 2.5 weeks; goals mod I  Continue CIR PT and OT, progressing well.  2.  Antithrombotics: -DVT/anticoagulation:  Pharmaceutical: Lovenox             -antiplatelet therapy: N/a 3. Chronic back pain/Pain Management: Hydrocodone prn  -well controlled; not requiring Hydrocodone.   4. H/o depression/Mood: team to provide ego support and help with anxiety.              -antipsychotic agents: N/A 5. Neuropsych: This patient is capable of making decisions on  his own behalf. 6. Skin/Wound Care: routine pressure relief measures.  7. Fluids/Electrolytes/Nutrition: Monitor I/O.  10. OSA: Encourage CPAP use at nights.  Patient states that he has lost about 50 pounds since his illness.  2/15- refusing CPAP- will have him f/u sleep clinic outpt to see if still needs  2/16- IM feels he still has OSA.  11. Pharyngeal mucositis: MMW Qid (was TID) and Nystatin mouth wash qid and trial of nebulized lidocaine added on  2/1. Will also add Diflucan 200 mg x 1 and 100 daily x 6 days.  2/4- added Flonase for sore throat   2/5- improved on exam and by hx   2/15- still a mild issue- will con't Nystatin moutwash 12. CKD Stage IIIa: Monitor BMET with serial checks.   2/3- Cr 1.12 and BUN slightly elevated at 25- a little dry- will encourage fluid intake.   2/6- recheck Monday   2/8: See #7  2/9- reviewed labs- Cr up to 1.52 from 1.39- BUN u to 31; will give IVFs 75 cc/hr x 12 hours and recheck in AM  2/10- gave IVFs 2/9- Cr dropped from 1.52 to 1.34 but BUN still  32- will allow PO intake and recheck Friday since sitl elevated.  2/12- ordered labs- not back- will monitor- will recheck Monday  2/15- Cr stable at 1.34- which is his baseline due to CKD StageIIIa. con't to encourage fluids- order written.   13. H/o recurrent GIB/chronic gastritis 05/2018: PPI recommended indefinitely-- will resume with recent use of NSAIDs as well as steroids on board.  14. Chronic diastolic CHF- will monitor daily weights and treat as required.   2/3- will order daily weights   Filed Weights   03/03/19 0500 03/04/19 0700 03/07/19 0606  Weight: 115.4 kg 112.2 kg 112.6 kg  Weights ranging from 112-115.  No clinical signs of CHF 16. Prolonged QT interval  Avoiding medications which prolong 17. Post nasal drip  2/15- on Flonase 2 sprays daily- which is max dose- will suggest Rhinocort at d/c.  18. Dispo  2/16- d/c this week, however has f/u with [facility] 2/24 and will need O2 at d/c based on timed testing drop to 83%   LOS: 14 days A FACE TO FACE EVALUATION WAS PERFORMED  Kurt [name] 03/09/2019, 8:37 AM

## 2019-03-09 NOTE — Progress Notes (Signed)
Physical Therapy Session Note  Patient Details  Name: Kurt [name] MRN: [id number] Date of Birth: May 29, 1950  Today's Date: 03/09/2019 PT Individual Time: 1300-1349 PT Individual Time Calculation (min): 49 min   Short Term Goals: Week 2:  PT Short Term Goal 1 (Week 2): STG=LTG due to ELOS.  Skilled Therapeutic Interventions/Progress Updates:     Patient in recliner in room on 1L/min O2 upon PT arrival. Patient alert and agreeable to PT session. Patient denied pain during session. He expressed concerns about his meals and nutrition intake in the hospital, however, stated that he had spoken with a supervisor to address his concerns and felt that they were heard. Encouraged him to set up an appointment with a nutritionist to discuss dietary changes for health management upon discharge. Also discussed d/c planning, PT goals, and recommendation for him to have someone with him the when entering/exiting his home for the first couple of weeks. Patient reported that he would have someone with him at d/c, then he would be leaving the house on his own Monday morning. PT educated on patient's increased risk for falls and safety concerns ambulating on unlevel surfaces independently, patient stated that he would wait to try it on his own only if he felt safe and would ask someone if he needed to. Reinforced that it is my recommendation that he have someone with him, patient stated understanding.   Focused session on re-assessing outcome measures for discharge assessment. Discussed results and interpretation of outcome measures with the patient and educated him on his progress since admission.   Therapeutic Activity: Transfers: Patient performed sit to/from stand x8 with mod I using a rollator or without an AD.   Gait Training:  Patient ambulated 287 feet using rollator with supervision for safety. Ambulated with mild forward trunk lean, decreased gait speed, decreased step height, trendelenburg gait, and  audible pace breathing throughout. Provided verbal cues for erect posture and increased step height for safety.  6 Min Walk Test:  Instructed patient to ambulate as quickling and as safely as possible for 6 minutes using LRAD. Patient was allowed to take standing rest breaks without stopping the test, but if he required a sitting rest break the clock would be stopped and the test would be over.  Results: 602 feet using a rollator on 4L/min O2, stopped for 25 sec standing rest break at 3 min then continued for the full 6 min. SPO2 90%, HR 130, and RPE 5/10 at end of test. Patient recovered to 97% and HR 106 in <2 min with pursed lip breathing on 4L/min.    Neuromuscular Re-ed: Patient performed the Berg Balance Scale: Patient demonstrates increased fall risk as noted by score of 49/56 on Berg Balance Scale.  (<36= high risk for falls, close to 100%; 37-45 significant >80%; 46-51 moderate >50%; 52-55 lower >25%)  Patient required several seated rest breaks throughout the balance test due to fatigue and limited activity tolerance. During rest breaks educated patient on fall risk/prevention, home modifications to prevent falls, and activation of emergency services in the event of a fall.   Patient handed off to [place], [place], while ambulating back to his room at end of session.   Therapy Documentation Precautions:  Precautions Precautions: Fall Precaution Comments: monitor O2 ( keep >88%, 1L at rest & 3L with moderate activity) Restrictions Weight Bearing Restrictions: No  Balance: Standardized Balance Assessment Standardized Balance Assessment: Berg Balance Test Berg Balance Test Sit to Stand: Able to stand without using hands and  stabilize independently Standing Unsupported: Able to stand safely 2 minutes Sitting with Back Unsupported but Feet Supported on Floor or Stool: Able to sit safely and securely 2 minutes Stand to Sit: Sits safely with minimal use of hands Transfers: Able to transfer  safely, minor use of hands Standing Unsupported with Eyes Closed: Able to stand 10 seconds safely Standing Ubsupported with Feet Together: Able to place feet together independently and stand 1 minute safely From Standing, Reach Forward with Outstretched Arm: Can reach confidently >25 cm (10") From Standing Position, Pick up Object from Floor: Able to pick up shoe safely and easily From Standing Position, Turn to Look Behind Over each Shoulder: Looks behind from both sides and weight shifts well Turn 360 Degrees: Able to turn 360 degrees safely in 4 seconds or less Standing Unsupported, Alternately Place Feet on Step/Stool: Able to complete >2 steps/needs minimal assist Standing Unsupported, One Foot in Front: Able to plae foot ahead of the other independently and hold 30 seconds Standing on One Leg: Tries to lift leg/unable to hold 3 seconds but remains standing independently Total Score: 49/56    Therapy/Group: Individual Therapy  Tianah Lonardo L Aviv Rota PT, DPT  03/09/2019, 4:34 PM

## 2019-03-09 NOTE — Progress Notes (Signed)
Occupational Therapy Session Note  Patient Details  Name: Kurt [name] MRN: [id number] Date of Birth: December 18, 1950  Today's Date: 03/09/2019 OT Individual Time: 9326-7124 OT Individual Time Calculation (min): 57 min    Short Term Goals: Week 2:  OT Short Term Goal 1 (Week 2): STG = LTG  Skilled Therapeutic Interventions/Progress Updates:    O2 sat at rest:  95% on 1L O2 sat with trial at room air - post 5 minutes with conversation:  93%, post 5 minutes of unsupported sitting, conversation and light activity: 83% - returned to 1L with sat improving to the low 90's within one minute O2 sat with moderate activity:  On 3L after 200+ ft walk 87% & 90%  Patient seated in recliner states that he had a good night but is disappointed with the food service.  Completed trial on room air as documented above.  Completed ambulation in room and to/from therapy gym with assist for O2 tank and CS.  He completed scapular stability exercises in stance/modified plank and seated with good tolerance - he notes ongoing improvement in left shoulder strength and mobility but continues to fatigue.  Returned to recliner at close of session and returned to 1L of O2 on wall system.  Call bell and tray table in reach.      Therapy Documentation Precautions:  Precautions Precautions: Fall Precaution Comments: monitor O2 ( used 6 liters for activity and 3 at rest) Restrictions Weight Bearing Restrictions: No General:   Vital Signs: Therapy Vitals Temp: (!) 97.5 F (36.4 C) Temp Source: Oral Pulse Rate: 85 BP: (!) 142/88 Patient Position (if appropriate): Lying Oxygen Therapy SpO2: 93 % O2 Device: Nasal Cannula   Therapy/Group: Individual Therapy  Barrie Lyme 03/09/2019, 7:39 AM

## 2019-03-10 ENCOUNTER — Inpatient Hospital Stay (HOSPITAL_COMMUNITY): Payer: Medicare Other

## 2019-03-10 ENCOUNTER — Inpatient Hospital Stay (HOSPITAL_COMMUNITY): Payer: Medicare Other | Admitting: Occupational Therapy

## 2019-03-10 NOTE — Progress Notes (Signed)
[facility] PHYSICAL MEDICINE & REHABILITATION PROGRESS NOTE   Subjective/Complaints:  Pt reports cough medicine helped a lot last night- slept much better.   [place] Tussoniex was helpful.   Sleeping better.  Cough still an issue during day.    ROS; pt denies CP, abd pain, N/V/C/D, Headache, vision changes- denies SOB and sore throat except a little at night.    Objective:   DG Chest 2 View  Result Date: 03/08/2019 CLINICAL DATA:  History of COVID-19 positivity with persistent cough EXAM: CHEST - 2 VIEW COMPARISON:  02/16/2019 FINDINGS: Cardiac shadow is mildly enlarged but stable. Diffuse bilateral opacities are again identified consistent with the patient's given clinical history. These are slightly improved when compared with the prior CT examination. No sizable effusion is noted. Degenerative changes of the thoracic spine are seen. IMPRESSION: Diffuse bilateral opacities consistent with the given clinical history although slightly improved from the prior CT examination. Electronically Signed   By: Alcide Clever M.D.   On: 03/08/2019 12:35   Recent Labs    03/08/19 0633  WBC 13.7*  HGB 12.8*  HCT 41.2  PLT 259   Recent Labs    03/08/19 0633  NA 138  K 3.9  CL 100  CO2 27  GLUCOSE 94  BUN 30*  CREATININE 1.34*  CALCIUM 8.9    Intake/Output Summary (Last 24 hours) at 03/10/2019 0856 Last data filed at 03/10/2019 0557 Gross per 24 hour  Intake 540 ml  Output 1000 ml  Net -460 ml     Physical Exam: Vital Signs Blood pressure (!) 144/82, pulse 88, temperature 97.8 F (36.6 C), temperature source Oral, resp. rate 18, height 5\' 10"  (1.778 m), weight 112.6 kg, SpO2 95 %.  Labs reviewed; vitals reviewed;  sitting up at bedside; wearing 1L O2; appears comfortable;  NAD HENT:  Head: Normocephalic and atraumatic.  Mouth/Throat: mild erythema back of oropharynx Eyes: conjugate gaze Neck: No tracheal deviation present.  Cardiovascular: RRR no M/R/G Respiratory: CTA  B/L no wheezing but does have a dry cough after listened to him- intermittent GI: Soft, NT, ND, (+)BS Musculoskeletal:        Comments: UEs- deltoid 3-/5 B/L; 5-/5 in arms biceps, triceps, WE, grip, finger abd B/L LEs- HF, KE, KF, DF, PF all 5-/5 B/L, unchanged Neurological: He is alert and oriented to person, place, and time. Decreased sensation to light touch in LEs; and LUE to elbow has decreased sensation to light touch.  Intact sensation in RUE.  Skin: Skin is warm and dry.  Bruise on knee  Psychiatric:  Appropriate this AM; very serious         Assessment/Plan: 1. Functional deficits secondary to debility from 3rd case of pneumonia since COVID 12/20- currently pseudomonas pneumonia which require 3+ hours per day of interdisciplinary therapy in a comprehensive inpatient rehab setting.  Physiatrist is providing close team supervision and 24 hour management of active medical problems listed below.  Physiatrist and rehab team continue to assess barriers to discharge/monitor patient progress toward functional and medical goals  Care Tool:  Bathing  Bathing activity did not occur: Safety/medical concerns(declined this session due to fatigue) Body parts bathed by patient: Right arm, Left arm, Chest, Abdomen, Front perineal area, Buttocks, Right upper leg, Left upper leg, Right lower leg, Left lower leg, Face         Bathing assist Assist Level: Independent with assistive device     Upper Body Dressing/Undressing Upper body dressing   What is the patient  wearing?: Pull over shirt    Upper body assist Assist Level: Independent    Lower Body Dressing/Undressing Lower body dressing      What is the patient wearing?: Pants, Underwear/pull up     Lower body assist Assist for lower body dressing: Independent with assitive device     Toileting Toileting    Toileting assist Assist for toileting: Independent with assistive device Assistive Device Comment: urinal    Transfers Chair/bed transfer  Transfers assist     Chair/bed transfer assist level: Independent Chair/bed transfer assistive device: Other(rollator)   Locomotion Ambulation   Ambulation assist      Assist level: Supervision/Verbal cueing Assistive device: Rollator(4L/min O2) Max distance: 602'   Walk 10 feet activity   Assist     Assist level: Independent with assistive device Assistive device: Rollator   Walk 50 feet activity   Assist    Assist level: Supervision/Verbal cueing Assistive device: Rollator    Walk 150 feet activity   Assist Walk 150 feet activity did not occur: Safety/medical concerns  Assist level: Supervision/Verbal cueing Assistive device: Rollator    Walk 10 feet on uneven surface  activity   Assist Walk 10 feet on uneven surfaces activity did not occur: Safety/medical concerns(decreased activity tolerance/balance)         Wheelchair     Assist Will patient use wheelchair at discharge?: No Type of Wheelchair: Manual    Wheelchair assist level: Supervision/Verbal cueing Max wheelchair distance: 150'    Wheelchair 50 feet with 2 turns activity    Assist        Assist Level: Supervision/Verbal cueing   Wheelchair 150 feet activity     Assist      Assist Level: Supervision/Verbal cueing   Blood pressure (!) 144/82, pulse 88, temperature 97.8 F (36.6 C), temperature source Oral, resp. rate 18, height 5\' 10"  (1.778 m), weight 112.6 kg, SpO2 95 %.  1.  Impaired mobility, function, and ADLs secondary to debility from  Pseudomonas pneumonia s/p 3rd recent hospitalization for pneumonia- initial hospital stay for COVID pneumonia.    Reduced O2 requirements, still with dry cough we will schedule his albuterol and monitor  2/15- no improvement with Albuterol- still on Prednisone- have seen this dry hacking cough in many post COVID patients- will see/look into- if there's any other treatment; albuterol NOT  working.   2/16- will try Tussoniex with hydrocodone q12 hours prn per IM and switch to Delsym at d/c; also they recommended reducing Prednisone to 40 mg at d/c and taper to off over 2 weeks. Will have to go home on O2, but should be able to wean off In 1-2 months completely  2/17- Slept much better with Tussoniex overnight- cough much improved.              -ELOS/Goals: 2- 2.5 weeks; goals mod I  Continue CIR PT and OT, progressing well.  2.  Antithrombotics: -DVT/anticoagulation:  Pharmaceutical: Lovenox             -antiplatelet therapy: N/a 3. Chronic back pain/Pain Management: Hydrocodone prn  -well controlled; not requiring Hydrocodone.   4. H/o depression/Mood: team to provide ego support and help with anxiety.              -antipsychotic agents: N/A 5. Neuropsych: This patient is capable of making decisions on his own behalf. 6. Skin/Wound Care: routine pressure relief measures.  7. Fluids/Electrolytes/Nutrition: Monitor I/O.  10. OSA: Encourage CPAP use at nights.  Patient states that  he has lost about 50 pounds since his illness.  2/15- refusing CPAP- will have him f/u sleep clinic outpt to see if still needs  2/16- IM feels he still has OSA.  11. Pharyngeal mucositis: MMW Qid (was TID) and Nystatin mouth wash qid and trial of nebulized lidocaine added on  2/1. Will also add Diflucan 200 mg x 1 and 100 daily x 6 days.  2/4- added Flonase for sore throat   2/5- improved on exam and by hx   2/15- still a mild issue- will con't Nystatin moutwash 12. CKD Stage IIIa: Monitor BMET with serial checks.   2/3- Cr 1.12 and BUN slightly elevated at 25- a little dry- will encourage fluid intake.   2/6- recheck Monday   2/8: See #7  2/9- reviewed labs- Cr up to 1.52 from 1.39- BUN u to 31; will give IVFs 75 cc/hr x 12 hours and recheck in AM  2/10- gave IVFs 2/9- Cr dropped from 1.52 to 1.34 but BUN still 32- will allow PO intake and recheck Friday since sitl elevated.  2/12- ordered labs-  not back- will monitor- will recheck Monday  2/15- Cr stable at 1.34- which is his baseline due to CKD StageIIIa. con't to encourage fluids- order written.   13. H/o recurrent GIB/chronic gastritis 05/2018: PPI recommended indefinitely-- will resume with recent use of NSAIDs as well as steroids on board.  14. Chronic diastolic CHF- will monitor daily weights and treat as required.   2/3- will order daily weights   Filed Weights   03/03/19 0500 03/04/19 0700 03/07/19 0606  Weight: 115.4 kg 112.2 kg 112.6 kg  Weights ranging from 112-115.  No clinical signs of CHF 16. Prolonged QT interval  Avoiding medications which prolong 17. Post nasal drip  2/15- on Flonase 2 sprays daily- which is max dose- will suggest Rhinocort at d/c.  18. Dispo  2/16- d/c this week, however has f/u with [facility] 2/24 and will need O2 at d/c based on timed testing drop to 83%  2/17- still using 1L at rest; 3L with activity   LOS: 15 days A FACE TO FACE EVALUATION WAS PERFORMED  Gissela Bloch 03/10/2019, 8:56 AM

## 2019-03-10 NOTE — Progress Notes (Addendum)
Social Work Patient ID: Kurt [name], male   DOB: 1950/03/12, 69 y.o.   MRN: [id number]    SW received updates from medical team, pt would like to discharge today as he is concerned about anticipate inclement weather. SW discussed with medical team. Medical team agreeable to pt to d/c today. SW met with pt in room to discuss further. Pt states that the 3in1 BSC was delivered to the home, and received TTB delivery as well.  Outpatient PT/OT ambulatory referral was sent to [facility] [phone]). SW updated pt assigned RN on pt son arrival this evening around/asfter [place], MSW, [place] Office: [phone] Cell: [phone] Fax: [phone]

## 2019-03-10 NOTE — Progress Notes (Signed)
Physical Therapy Discharge Summary  Patient Details  Name: Kurt [name] MRN: [id number] Date of Birth: August 06, 1950  Today's Date: 03/10/2019 PT Individual Time: 2025-4270 and 1300-1415 PT Individual Time Calculation (min): 60 min and 75 min    Patient has met 9 of 9 long term goals due to improved activity tolerance, improved balance, improved postural control, increased strength, increased range of motion and ability to compensate for deficits.  Patient to discharge at an ambulatory level Modified Independent.   Patient's care partner unavailable for family education, however, he will have someone to provide supervision when entering/exiting his home the first time for safety  at discharge.  Recommendation:  Patient will benefit from ongoing skilled PT services in outpatient setting to continue to advance safe functional mobility, address ongoing impairments in balance, strength, activity tolerance, reduce O2 support, functional mobility, patient education, and minimize fall risk.  Equipment: No equipment provided  Reasons for discharge: treatment goals met  Patient/family agrees with progress made and goals achieved: Yes  Skilled Therapeutic Intervention: Session 1: Patient in recliner in room on 1L/min O2 upon PT arrival. Patient alert and agreeable to PT session. Patient denied pain during session.  Therapeutic Activity: Bed Mobility: Patient performed rolling R/L and supine to/from sit independently in the ADL bed.  Transfers: Patient performed sit to/from stand from the hospital recliner, ADL recliner, ADL bed, and using the rollator with mod I using the Rollator or a SPC. Patient independently locked the breaks appropriately throughout session.  Patient performed a simulated truck height with step-up to simulate running board transfer with mod I on the drivers side using SPC. Demonstrated safe technique. Educated patient on having someone with him the first few times he gets into  the car for safety. Patient stated understanding. Educated patient on fall risk/prevention, home modifications to prevent falls, and activation of emergency services in the event of a fall during session.   Gait Training:  Patient ambulated >200 feet x2 and ~50 feet using a rollator with mod I. Ambulated as described below.  He performed a simulated home entrance set up with patient using a SPC and carrying portable O2 tank over his shoulder ambulating 6 feet, using a 4" platform with a chair on the R, ambulating 10 feet to stairs, up/down 8 steps 1 rail then up/down 7" platform with mod I and supervision for 7" step for safety and min cues for sequencing. Performed on 4L/min, O2 sats 91% and HR 117 after, recovered to 97% and HR 95 in <2 min. Patient reports that he has a grab bar in his doorway where his 7" step is and stated that he will use it for balance due to minor LOB during trial. Patient self corrected LOB without assist. Reinforced education on entering and exiting his home with someone with him for the first 3-4 times for safety. Also educated on avoiding going out in inclement weather.  Instructed patient to remain on 1-2L/min at rest and 4L/min O2 with activity to maintain O2 saturations above 90% and to follow instructions for weaning O2 with MD and OPPT. Patient in agreement.  Patient in recliner in room at end of session with breaks locked and all needs within reach.   Session 2: Patient in recliner in room upon PT arrival. Patient alert and agreeable to PT session. Patient denied pain during session.  Therapeutic Activity: Transfers: Patient performed sit to/from stand transfers with the rollator and SPC with mod I throughout session.  Gait Training:  Patient ambulated ~  100 feet x2 using a rollator and 15 feet in the room using a SPC with mod I, patient able to manage O2 line in room independently to ambulate to/from the bathroom.   Therapeutic Exercise: Patient performed the  following exercises with verbal and tactile cues for proper technique. -sit to stands without UE support 2x5 -side stepping R/L 2x 10 feet -standing heel/toe raises with B UE support  Patient in recliner in room at end of session with breaks locked and all needs within reach. Made patient independent in the room using a SPC, educated on wearing tennis shoes or non-skid socks when ambulating in the room and calling for assistance if needed.   Patient with mildly elevated HR throughout session, between 106-111 at rest and 120s with activity. Remained on 1L/min at rest and 4L/min with activity with SPO2 >92% throughout. Temperature 100.3 when taken during session, 99.3 when reassessed 5 min later. RN made aware of vitals at end of session.    PT Discharge Precautions/Restrictions Precautions Precautions: Fall Precaution Comments: monitor O2 ( keep >88%, 1L at rest, 3L-4L) Restrictions Weight Bearing Restrictions: No Vision/Perception  Perception Perception: Within Functional Limits Praxis Praxis: Intact  Cognition Overall Cognitive Status: Within Functional Limits for tasks assessed Arousal/Alertness: Awake/alert Focused Attention: Appears intact Sustained Attention: Appears intact Alternating Attention: Appears intact Memory: Appears intact Awareness: Appears intact Problem Solving: Appears intact Decision Making: Appears intact Safety/Judgment: Appears intact Sensation Sensation Light Touch: Impaired Detail Peripheral sensation comments: 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, reported increased numbness half way up his forearm that is worse since COVID diagnosis, also 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 Light Touch Impaired Details: Impaired LUE;Impaired RLE;Impaired LLE Proprioception: Appears Intact Coordination Gross Motor Movements are Fluid and Coordinated: Yes Fine Motor Movements are Fluid and Coordinated: Yes Heel Shin Test: [facility] Motor  Motor Motor: Other  (comment) Motor - Discharge Observations: Decreased activity tolerance and increased O2 dependence from baseline, decreased balance  Mobility Bed Mobility Bed Mobility: Rolling Right;Rolling Left;Supine to Sit;Sit to Supine Rolling Right: Independent Rolling Left: Independent Supine to Sit: Independent Sit to Supine: Independent Transfers Sit to Stand: Independent Stand to Sit: Independent Stand Pivot Transfers: Independent with assistive device Transfer (Assistive device): Straight cane Locomotion  Gait Gait Assistance: Independent with assistive device Gait Distance (Feet): 200 Feet(on average) Assistive device: 4-wheeled walker Gait Gait: Yes Gait Pattern: Impaired Gait Pattern: Step-through pattern;Decreased hip/knee flexion - left;Decreased hip/knee flexion - right;Trendelenburg;Lateral trunk lean to right;Lateral trunk lean to left;Decreased trunk rotation Gait velocity: decreased High Level Ambulation High Level Ambulation: Side stepping Side Stepping: with B UE support on counter with mod I Stairs / Additional Locomotion Stairs: Yes Stairs Assistance: Independent with assistive device Stair Management Technique: One rail Right;With cane Number of Stairs: 8 Height of Stairs: 6 Ramp: Independent with assistive device(with SPC) Curb: Independent with assistive device(with SPC and 1 rail) Wheelchair Mobility Wheelchair Mobility: No(Patient is a Engineer, petroleum)  Trunk/Postural Assessment  Cervical Assessment Cervical Assessment: Within Functional Limits Thoracic Assessment Thoracic Assessment: Exceptions to WFL(rounded shoulders) Lumbar Assessment Lumbar Assessment: Exceptions to WFL(previous back sxs- slight lordosis) Postural Control Postural Control: Within Functional Limits  Balance Balance Balance Assessed: Yes Standardized Balance Assessment Standardized Balance Assessment: Berg Balance Test Berg Balance Test Sit to Stand: Able to stand without using  hands and stabilize independently Standing Unsupported: Able to stand safely 2 minutes Sitting with Back Unsupported but Feet Supported on Floor or Stool: Able to sit safely and securely 2  minutes Stand to Sit: Sits safely with minimal use of hands Transfers: Able to transfer safely, minor use of hands Standing Unsupported with Eyes Closed: Able to stand 10 seconds safely Standing Ubsupported with Feet Together: Able to place feet together independently and stand 1 minute safely From Standing, Reach Forward with Outstretched Arm: Can reach confidently >25 cm (10") From Standing Position, Pick up Object from Floor: Able to pick up shoe safely and easily From Standing Position, Turn to Look Behind Over each Shoulder: Looks behind from both sides and weight shifts well Turn 360 Degrees: Able to turn 360 degrees safely in 4 seconds or less Standing Unsupported, Alternately Place Feet on Step/Stool: Able to complete >2 steps/needs minimal assist Standing Unsupported, One Foot in Front: Able to plae foot ahead of the other independently and hold 30 seconds Standing on One Leg: Tries to lift leg/unable to hold 3 seconds but remains standing independently Total Score: 49 Static Sitting Balance Static Sitting - Balance Support: No upper extremity supported;Feet unsupported Static Sitting - Level of Assistance: 7: Independent Dynamic Sitting Balance Dynamic Sitting - Balance Support: No upper extremity supported;Feet supported;During functional activity Dynamic Sitting - Level of Assistance: 7: Independent Dynamic Sitting - Balance Activities: Lateral lean/weight shifting;Forward lean/weight shifting;Reaching for objects Static Standing Balance Static Standing - Balance Support: No upper extremity supported;During functional activity Static Standing - Level of Assistance: 7: Independent Dynamic Standing Balance Dynamic Standing - Balance Support: Right upper extremity supported;Left upper extremity  supported Dynamic Standing - Level of Assistance: 6: Modified independent (Device/Increase time) Dynamic Standing - Balance Activities: Lateral lean/weight shifting;Forward lean/weight shifting;Reaching for objects Extremity Assessment  RUE Assessment RUE Assessment: Exceptions to [facility] General Strength Comments: old injury- shoulder 0-100, limited full elbow extension- able to perform ADL tasks without difficulty- has learn to accomodate. AROM in supine full, 3/4 proximal OH reach in sitting LUE Assessment General Strength Comments: PROM WFL, AROM in supine full OH flexion and abd, proximal weakness/scapular instability limit OH reach in sitting able to complete full flexion/abduction after facilitation but fatigues quickly RLE Assessment RLE Assessment: Within Functional Limits Active Range of Motion (AROM) Comments: WFL for all functional mobility General Strength Comments: Grossly in sitting: 5/5 throughout LLE Assessment LLE Assessment: Within Functional Limits Active Range of Motion (AROM) Comments: WFL for all functional mobility General Strength Comments: Grossly in sitting: 5/5 throughout    Wyatt Thorstenson L Rhapsody Wolven PT, DPT  03/10/2019, 8:00 AM

## 2019-03-10 NOTE — Progress Notes (Signed)
Occupational Therapy Session Note  Patient Details  Name: Kurt [name] MRN: [id number] Date of Birth: [birth year]  Today's Date: 03/10/2019 OT Individual Time: 1045-1130 OT Individual Time Calculation (min): 45 min    Short Term Goals: Week 2:  OT Short Term Goal 1 (Week 2): STG = LTG  Skilled Therapeutic Interventions/Progress Updates:    Treatment session with focus on functional mobility, endurance, and transfers in home environment.  Pt ambulated to ADL apt >200' with Rollator with one standing rest break while on 4L O2.  Pt sats 86% on 4L with HR in 120s, returned to 95% and HR down to 106 in <1 min.  Pt reports having more congestion this date and having had recently received breathing treatment to assist.  Engaged in functional transfers in ADL apt with pt completing tub/shower transfer with use of tub bench Mod I.  Pt completed toilet transfer and toileting at Mod I level.  Discussed meal prep and homemaking tasks with pt stating feeling good about each task and having a person who can assist with homemaking tasks as needed.  Returned to room >200' with Rollator Mod I with pt requiring one seated rest break O2 sats 90% on 4L and HR in 120s, HR down to 107 and sats up to 94%.  Returned to 1L at rest with sats remaining in mid 90s.  Pt reports ready to d/c home and feeling better than when he left previous times.  Therapy Documentation Precautions:  Precautions Precautions: Fall Precaution Comments: monitor O2 ( keep >88%, 1L at rest, 3L-4L) Restrictions Weight Bearing Restrictions: No Vital Signs: Therapy Vitals Temp: 98.2 F (36.8 C) Temp Source: Oral Pulse Rate: (!) 104 Resp: 18 BP: 131/81 Oxygen Therapy SpO2: 98 % O2 Device: Nasal Cannula O2 Flow Rate (L/min): 1 L/min Pain:   Pt with no c/o pain  Therapy/Group: Individual Therapy  Rosalio Loud 03/10/2019, 3:39 PM

## 2019-03-11 ENCOUNTER — Encounter (HOSPITAL_COMMUNITY): Payer: Self-pay | Admitting: Physical Medicine and Rehabilitation

## 2019-03-11 ENCOUNTER — Inpatient Hospital Stay (HOSPITAL_COMMUNITY): Payer: Medicare Other

## 2019-03-11 LAB — COMPREHENSIVE METABOLIC PANEL
ALT: 47 U/L — ABNORMAL HIGH (ref 0–44)
AST: 22 U/L (ref 15–41)
Albumin: 3.8 g/dL (ref 3.5–5.0)
Alkaline Phosphatase: 52 U/L (ref 38–126)
Anion gap: 12 (ref 5–15)
BUN: 30 mg/dL — ABNORMAL HIGH (ref 8–23)
CO2: 28 mmol/L (ref 22–32)
Calcium: 9.2 mg/dL (ref 8.9–10.3)
Chloride: 96 mmol/L — ABNORMAL LOW (ref 98–111)
Creatinine, Ser: 1.38 mg/dL — ABNORMAL HIGH (ref 0.61–1.24)
GFR calc Af Amer: 60 mL/min — ABNORMAL LOW (ref >60)
GFR calc non Af Amer: 51 mL/min — ABNORMAL LOW (ref >60)
Glucose, Bld: 110 mg/dL — ABNORMAL HIGH (ref 70–99)
Potassium: 3.9 mmol/L (ref 3.5–5.1)
Sodium: 136 mmol/L (ref 135–145)
Total Bilirubin: 1.3 mg/dL — ABNORMAL HIGH (ref 0.3–1.2)
Total Protein: 6.7 g/dL (ref 6.5–8.1)

## 2019-03-11 LAB — D-DIMER, QUANTITATIVE: D-Dimer, Quant: <0.27 ug/mL-FEU (ref 0.00–0.50)

## 2019-03-11 LAB — CBC WITH DIFFERENTIAL/PLATELET
Abs Immature Granulocytes: 0.09 10*3/uL — ABNORMAL HIGH (ref 0.00–0.07)
Basophils Absolute: 0 10*3/uL (ref 0.0–0.1)
Basophils Relative: 0 %
Eosinophils Absolute: 0.1 10*3/uL (ref 0.0–0.5)
Eosinophils Relative: 1 %
HCT: 42.6 % (ref 39.0–52.0)
Hemoglobin: 13.7 g/dL (ref 13.0–17.0)
Immature Granulocytes: 1 %
Lymphocytes Relative: 9 %
Lymphs Abs: 1.1 10*3/uL (ref 0.7–4.0)
MCH: 28.1 pg (ref 26.0–34.0)
MCHC: 32.2 g/dL (ref 30.0–36.0)
MCV: 87.5 fL (ref 80.0–100.0)
Monocytes Absolute: 0.5 10*3/uL (ref 0.1–1.0)
Monocytes Relative: 4 %
Neutro Abs: 11.1 10*3/uL — ABNORMAL HIGH (ref 1.7–7.7)
Neutrophils Relative %: 85 %
Platelets: 207 10*3/uL (ref 150–400)
RBC: 4.87 MIL/uL (ref 4.22–5.81)
RDW: 15.8 % — ABNORMAL HIGH (ref 11.5–15.5)
WBC: 12.9 10*3/uL — ABNORMAL HIGH (ref 4.0–10.5)
nRBC: 0 % (ref 0.0–0.2)

## 2019-03-11 MED ORDER — CARVEDILOL 3.125 MG PO TABS
3.1250 mg | ORAL_TABLET | Freq: Two times a day (BID) | ORAL | Status: DC
  Administered 2019-03-11 – 2019-03-12 (×2): 3.125 mg via ORAL
  Filled 2019-03-11 (×2): qty 1

## 2019-03-11 NOTE — Progress Notes (Signed)
Patient up in chair upon entering room, states he was anticipating going home but iis extrenely concern with his heart rate suddenly elevated and he became clammy , States he never had this happen to him before and would appreciate talking with his doctor in the morning. " I may need to stay a few more days.,reassurance provided., patient appears anxious during additional conversation related to this incident.VS monitor denies pain.

## 2019-03-11 NOTE — Plan of Care (Signed)
  Problem: RH BOWEL ELIMINATION Goal: RH STG MANAGE BOWEL WITH ASSISTANCE Description: STG Manage Bowel with  Min Assistance. Outcome: Progressing Goal: RH STG MANAGE BOWEL W/MEDICATION W/ASSISTANCE Description: STG Manage Bowel with Medication with Min  Assistance. Outcome: Progressing   Problem: RH BLADDER ELIMINATION Goal: RH STG MANAGE BLADDER WITH ASSISTANCE Description: STG Manage Bladder With Min Assistance Outcome: Progressing   Problem: RH SKIN INTEGRITY Goal: RH STG SKIN FREE OF INFECTION/BREAKDOWN Outcome: Progressing Goal: RH STG MAINTAIN SKIN INTEGRITY WITH ASSISTANCE Description: STG Maintain Skin Integrity With Min Assistance. Outcome: Progressing   Problem: RH SAFETY Goal: RH STG ADHERE TO SAFETY PRECAUTIONS W/ASSISTANCE/DEVICE Description: STG Adhere to Safety Precautions With Min  Assistance/Device. Outcome: Progressing Goal: RH STG DECREASED RISK OF FALL WITH ASSISTANCE Description: STG Decreased Risk of Fall With Min Assistance. Outcome: Progressing   Problem: RH PAIN MANAGEMENT Goal: RH STG PAIN MANAGED AT OR BELOW PT'S PAIN GOAL Description: Pt will be free of pain Outcome: Progressing   Problem: RH KNOWLEDGE DEFICIT GENERAL Goal: RH STG INCREASE KNOWLEDGE OF SELF CARE AFTER HOSPITALIZATION Outcome: Progressing   Problem: Consults Goal: RH GENERAL PATIENT EDUCATION Description: See Patient Education module for education specifics. Outcome: Progressing   

## 2019-03-11 NOTE — Consult Note (Signed)
Medical Consultation   Kurt [name]  GTX:[id number]  DOB: Aug 26, 1950  DOA: 02/23/2019  PCP: Kaleen Mask, MD   Outpatient Specialists:  Everardo All - pulmonology; Pool - neurosurgery   Requesting physician: [facility]  Reason for consultation: Patient had COVID then PNA x 2.  Came to rehab for debility.  Was to go home yesterday but in the afternoon he developed tachycardia with short distance ambulation.  EKG at 105, sinus tachycardia; ?also afib.  He is very symptomatic with coughing, choking, hard to get a good breath with marching in place x 30 seconds.  Usually wears 1L at rest, 3L with activity.  No increased oxygen requirement, feels fine at rest.  This occurred acutely yesterday.  Suggest repeat labs, CXR, D-dimer.  History of Present Illness: Kurt [name] is an 69 y.o. male with h/o chronic diastolic CHF; HTN; stage 3 CKD; and recent COVID-19 PNA.  He was admitted initially from 12/6-16 for acute respiratory failure with COVID PNA and then readmitted from 12/28-1/2 for worsening symptoms.  He returned from 1/25-2/2 with cavitary PNA, treated with Cefepime and Linezolid -> Cefepime as well as prolonged prednisone taper.  He was admitted to CIR on 2/2 and was scheduled for d/c yesterday (2/17).  He walked 6 1/2 minutes 2 days ago with good pulse ox, HR 105.  Yesterday AM, he felt more fatigued and his HR would elevate.    He was previously having problems with hypoxia and tachycardia but this had resolved.  He had planned to go home yesterday; he was sitting in the chair and his heart was increasing to 120s.  He is feeling well at rest, other than having hot flashes.  He did not have much cough with the COVID, but has had cough with ambulation.  They noted that he was hyperventilating and so he controlled his breathing and that improved.  Temp up to 100.3.    Review of Systems:  ROS As per HPI otherwise 10 point review of systems negative.    Past Medical  History: Past Medical History:  Diagnosis Date  . Arthritis   . CHF (congestive heart failure) (HCC)    diastolic dysfunction  . Chronic back pain   . COVID-19   . Depression   . Diverticulitis   . Duodenal ulcer 05/2018   Treated with 3 clips/Dr. Marina Goodell  . Fall off scafolding 2003   prolonged hospitalization- multiple BUE surgery, concussion  . GI bleed 05/2018  . GIB (gastrointestinal bleeding) 2016   Antral ulcer/duodenal bleed per Endoscopy by Dr. Elnoria Howard  . Hypertension   . PTSD (post-traumatic stress disorder)    after  a Fall   . Renal insufficiency    Decrease in GFR- PCP removed all NSAIDS & aspirin     Past Surgical History: Past Surgical History:  Procedure Laterality Date  . BIOPSY  06/16/2018   Procedure: BIOPSY;  Surgeon: Hilarie Fredrickson, MD;  Location: [facility] ENDOSCOPY;  Service: Endoscopy;;  . ELBOW FRACTURE SURGERY     ORIF, Shoulder manipulation    . ESOPHAGOGASTRODUODENOSCOPY N/A 04/30/2014   Procedure: ESOPHAGOGASTRODUODENOSCOPY (EGD);  Surgeon: Jeani Hawking, MD;  Location[facility] ENDOSCOPY;  Service: Endoscopy;  Laterality: N/A;  . ESOPHAGOGASTRODUODENOSCOPY (EGD) WITH PROPOFOL N/A 06/16/2018   Procedure: ESOPHAGOGASTRODUODENOSCOPY (EGD) WITH PROPOFOL;  Surgeon: Hilarie Fredrickson, MD;  Location: [facility] ENDOSCOPY;  Service: Endoscopy;  Laterality: N/A;  . HEMOSTASIS CLIP PLACEMENT  06/16/2018   Procedure: HEMOSTASIS CLIP PLACEMENT;  Surgeon: Irene Shipper, MD;  Location: [facility] ENDOSCOPY;  Service: Endoscopy;;  . LUMBAR LAMINECTOMY/DECOMPRESSION MICRODISCECTOMY N/A 02/16/2015   Procedure: Thoracic ten - thoracic twelve laminectomy;  Surgeon: Earnie Larsson, MD;  Location: [place] NEURO ORS;  Service: Neurosurgery;  Laterality: N/A;  . LUMBAR LAMINECTOMY/DECOMPRESSION MICRODISCECTOMY N/A 03/22/2016   Procedure: Lumbar One-Two, Lumbar Two-Three, Lumbar Three-Four Laminectomy and Foraminotomy;  Surgeon: Earnie Larsson, MD;  Location: [place];  Service: Neurosurgery;  Laterality: N/A;  . NASAL SEPTUM SURGERY     . SCLEROTHERAPY  06/16/2018   Procedure: SCLEROTHERAPY;  Surgeon: Irene Shipper, MD;  Location: [facility] ENDOSCOPY;  Service: Endoscopy;;  . VIDEO BRONCHOSCOPY Bilateral 02/18/2019   Procedure: VIDEO BRONCHOSCOPY WITHOUT FLUORO;  Surgeon: Margaretha Seeds, MD;  Location: [place];  Service: Pulmonary;  Laterality: Bilateral;     Allergies:  No Known Allergies   Social History:  reports that he has never smoked. He has never used smokeless tobacco. He reports previous alcohol use. He reports that he does not use drugs.   Family History: History reviewed. No pertinent family history.    Physical Exam: Vitals:   03/10/19 2055 03/11/19 0637 03/11/19 0927 03/11/19 0930  BP: 133/82 135/80 138/79 138/79  Pulse: 82 83 71   Resp:  18    Temp: 99.1 F (37.3 C) 97.6 F (36.4 C)    TempSrc: Oral Oral    SpO2: 97% 97%    Weight:  112.4 kg    Height:        Constitutional: Alert and awake, oriented x3, not in any acute distress. Eyes: PERLA, EOMI, irises appear normal, anicteric sclera,  ENMT: external ears and nose appear normal, normal hearing, Lips appear normal, oropharynx mucosa, tongue, posterior pharynx appear normal  Neck: neck appears normal, no masses, normal ROM, no thyromegaly, no JVD  CVS: S1-S2 clear, no murmur rubs or gallops, no LE edema, normal pedal pulses  Respiratory:  clear to auscultation bilaterally, no wheezing, rales or rhonchi. Respiratory effort normal. No accessory muscle use.  Abdomen: soft nontender, nondistended, normal bowel sounds, no hepatosplenomegaly, no hernias  Musculoskeletal: : no cyanosis, clubbing or edema noted bilaterally Neuro: Cranial nerves II-XII intact, strength, sensation, reflexes Psych: judgement and insight appear normal, stable mood and affect, mental status Skin: no rashes or lesions or ulcers, no induration or nodules    Data reviewed:  I have personally reviewed the recent labs and imaging studies  Pertinent Labs:    Glucose 110 BUN 30/Creatinine 1.38/GFR 51 - stable from 2/15 WBC 12.9, improved D-dimer <0.27   Inpatient Medications:   Scheduled Meds: . allopurinol  100 mg Oral BID  . amLODipine  2.5 mg Oral Daily  . enoxaparin (LOVENOX) injection  40 mg Subcutaneous Q24H  . ferrous sulfate  325 mg Oral Q breakfast  . fluticasone  2 spray Each Nare Daily  . furosemide  40 mg Oral Daily  . magic mouthwash w/lidocaine  5 mL Oral TID  . mouth rinse  15 mL Mouth Rinse BID  . Melatonin  6 mg Oral QHS  . menthol-cetylpyridinium  1 lozenge Oral TID  . montelukast  10 mg Oral QHS  . pantoprazole  40 mg Oral BID  . predniSONE  60 mg Oral Q breakfast   Followed by  . [START ON 03/24/2019] predniSONE  40 mg Oral Q breakfast   Followed by  . [START ON 04/23/2019] predniSONE  20 mg Oral Q breakfast  . sertraline  200 mg Oral q morning - 10a  . sulfamethoxazole-trimethoprim  1 tablet Oral Once per day on Mon Wed Fri   Continuous Infusions:   Radiological Exams on Admission: DG Chest 2 View  Result Date: 03/11/2019 CLINICAL DATA:  Worsening shortness of breath.  History of COVID EXAM: CHEST - 2 VIEW COMPARISON:  03/08/2019 FINDINGS: Heart is upper limits normal in size. Coarsened interstitial opacities throughout the lungs, slightly improved since prior study. No effusions or pneumothorax. No acute bony abnormality. IMPRESSION: Interstitial prominence throughout the lungs, slightly improved since prior study. Electronically Signed   By: Charlett Nose M.D.   On: 03/11/2019 10:44    Impression/Recommendations Principal Problem:   Physical debility Active Problems:   CKD (chronic kidney disease) stage 3, GFR 30-59 ml/min   Acute on chronic respiratory failure (HCC)   Class 2 obesity with body mass index (BMI) of 36.0 to 36.9 in adult   OSA (obstructive sleep apnea)   Multifocal pneumonia   Diastolic CHF (HCC)  Debility -Patient with recurrent hospitalizations for respiratory infections following  COVID-19 infection in November -He was discharged to CIR on 2/2 and has been making good progress -However, yesterday he noticed increased SOB and tachycardia with exertion -Symptoms are ongoing and he has cough -Evaluation unremarkable - labs reassuring, CXR with improving findings -This appears to be ongoing waxing/waning symptoms associated with critical illness  -However, there does not appear to be an acute indication for ongoing medical care at this time -Would encourage patient to do as much as he is comfortable doing but not overexert himself  Acute on chronic respiratory failure with improving PNA -COVID-19 infection and then recurrent admissions for PNA 12/28-1/2 and 1/25-2/2 -Bronch on 1/28 with findings c/w interstitial lung disease -Will need outpatient f/u for this issue  Diastolic CHF -He does not appear to be volume overloaded at this time -Suggest outpatient f/u  Stage 3 CKD -Currently Stage 3a CKD, appears to be at/near baseline  Obesity -BMI has decreased from 40 when I admitted him on 12/28 to 35.56 now -Ongoing efforts at weight loss should be encouraged  OSA -Suggested to have outpatient sleep study  HTN -Continue Norvasc -Consider addition of low-dose beta blocker for control of ambulatory tachycardia  Depression -Continue Zoloft    Thank you for this consultation.  [facility] hospitalist team will sign off at this time, as he appears to be approaching discharge.  Please reconsult if additional issues arise.   Time Spent: 60 minutes  Jonah Blue M.D. [facility] 03/11/2019, 12:56 PM

## 2019-03-11 NOTE — Progress Notes (Addendum)
[place] PHYSICAL MEDICINE & REHABILITATION PROGRESS NOTE   Subjective/Complaints:  Pt reports abrupt onset of tachycardia with walking and choking/coughing so hard, can barely breathe when walks a few steps OR marches in place to get "active".     ROS; pt denies CP, abd pain, N/V/C/D, Headache, vision changes- denies SOB and sore throat except a little at night.    Objective:   No results found. No results for input(s): WBC, HGB, HCT, PLT in the last 72 hours. No results for input(s): NA, K, CL, CO2, GLUCOSE, BUN, CREATININE, CALCIUM in the last 72 hours.  Intake/Output Summary (Last 24 hours) at 03/11/2019 1030 Last data filed at 03/11/2019 0800 Gross per 24 hour  Intake 1020 ml  Output 1075 ml  Net -55 ml     Physical Exam: Vital Signs Blood pressure 138/79, pulse 71, temperature 97.6 F (36.4 C), temperature source Oral, resp. rate 18, height 5\' 10"  (1.778 m), weight 112.4 kg, SpO2 97 %.  Labs ordered; vitals reviewed- Of note, pt wearing 1L O2- appears comfortable at rest, however when marches in place x<30 seconds, appears ill- coughing hacking dry cough that won't stop and choking; cannot breathe easily, NAD at rest  sitting up at bedside NAD HENT:  Head: Normocephalic and atraumatic.  Mouth/Throat: mild erythema back of oropharynx Eyes: conjugate gaze Neck: No tracheal deviation present.  Cardiovascular: with easy movement, tachycardic to 120s-130s; sounds like in Afib on exam Respiratory: lungs decreased at bases; coughing, choking, hard to breathe, however once heart rate goes down, CTA B/L GI: Soft, NT, ND, (+)BS Musculoskeletal:        Comments: UEs- deltoid 3-/5 B/L; 5-/5 in arms biceps, triceps, WE, grip, finger abd B/L LEs- HF, KE, KF, DF, PF all 5-/5 B/L, unchanged Neurological: He is alert and oriented to person, place, and time. Decreased sensation to light touch in LEs; and LUE to elbow has decreased sensation to light touch.  Intact sensation in  RUE.  Skin: Skin is warm and dry.  Bruise on knee  Psychiatric:  Appropriate this AM; very serious; concerned about medical change         Assessment/Plan: 1. Functional deficits secondary to debility from 3rd case of pneumonia since COVID 12/20- currently pseudomonas pneumonia which require 3+ hours per day of interdisciplinary therapy in a comprehensive inpatient rehab setting.  Physiatrist is providing close team supervision and 24 hour management of active medical problems listed below.  Physiatrist and rehab team continue to assess barriers to discharge/monitor patient progress toward functional and medical goals  Care Tool:  Bathing  Bathing activity did not occur: Safety/medical concerns(declined this session due to fatigue) Body parts bathed by patient: Right arm, Left arm, Chest, Abdomen, Front perineal area, Buttocks, Right upper leg, Left upper leg, Right lower leg, Left lower leg, Face         Bathing assist Assist Level: Independent with assistive device     Upper Body Dressing/Undressing Upper body dressing   What is the patient wearing?: Pull over shirt    Upper body assist Assist Level: Independent    Lower Body Dressing/Undressing Lower body dressing      What is the patient wearing?: Pants, Underwear/pull up     Lower body assist Assist for lower body dressing: Independent with assitive device     Toileting Toileting    Toileting assist Assist for toileting: Independent with assistive device Assistive Device Comment: urinal   Transfers Chair/bed transfer  Transfers assist  Chair/bed transfer assist level: Independent with assistive device Chair/bed transfer assistive device: Research officer, political party   Ambulation assist      Assist level: Independent with assistive device Assistive device: Rollator Max distance: 200'   Walk 10 feet activity   Assist     Assist level: Independent with assistive device Assistive  device: Cane-straight   Walk 50 feet activity   Assist    Assist level: Independent with assistive device Assistive device: Rollator    Walk 150 feet activity   Assist Walk 150 feet activity did not occur: Safety/medical concerns  Assist level: Independent with assistive device Assistive device: Rollator    Walk 10 feet on uneven surface  activity   Assist Walk 10 feet on uneven surfaces activity did not occur: Safety/medical concerns(decreased activity tolerance/balance)   Assist level: Independent with assistive device Assistive device: Government social research officer Will patient use wheelchair at discharge?: No(patient is a functional ambulator) Type of Wheelchair: Manual Wheelchair activity did not occur: N/A  Wheelchair assist level: Supervision/Verbal cueing Max wheelchair distance: 150'    Wheelchair 50 feet with 2 turns activity    Assist    Wheelchair 50 feet with 2 turns activity did not occur: N/A   Assist Level: Supervision/Verbal cueing   Wheelchair 150 feet activity     Assist  Wheelchair 150 feet activity did not occur: N/A   Assist Level: Supervision/Verbal cueing   Blood pressure 138/79, pulse 71, temperature 97.6 F (36.4 C), temperature source Oral, resp. rate 18, height 5\' 10"  (1.778 m), weight 112.4 kg, SpO2 97 %.  1.  Impaired mobility, function, and ADLs secondary to debility from  Pseudomonas pneumonia s/p 3rd recent hospitalization for pneumonia- initial hospital stay for COVID pneumonia.    Reduced O2 requirements, still with dry cough we will schedule his albuterol and monitor  2/15- no improvement with Albuterol- still on Prednisone- have seen this dry hacking cough in many post COVID patients- will see/look into- if there's any other treatment; albuterol NOT working.   2/16- will try Tussoniex with hydrocodone q12 hours prn per IM and switch to Delsym at d/c; also they recommended reducing Prednisone to 40 mg  at d/c and taper to off over 2 weeks. Will have to go home on O2, but should be able to wean off In 1-2 months completely  2/17- Slept much better with Tussoniex overnight- cough much improved.   2/18- new onset tachycardia and coughing/choking with simple movement- had pt march in place- Concerned about PE or new onset Afib- got EKG-is sinus tachycardia at 105- called IM consult and discussed pt- decided to get formal consult for new medical issues.              -ELOS/Goals: 2- 2.5 weeks; goals mod I  Continue CIR PT and OT, progressing well.  2.  Antithrombotics: -DVT/anticoagulation:  Pharmaceutical: Lovenox             -antiplatelet therapy: N/a 3. Chronic back pain/Pain Management: Hydrocodone prn  -well controlled; not requiring Hydrocodone.   4. H/o depression/Mood: team to provide ego support and help with anxiety.              -antipsychotic agents: N/A 5. Neuropsych: This patient is capable of making decisions on his own behalf. 6. Skin/Wound Care: routine pressure relief measures.  7. Fluids/Electrolytes/Nutrition: Monitor I/O.  10. OSA: Encourage CPAP use at nights.  Patient states that he has lost about 50 pounds  since his illness.  2/15- refusing CPAP- will have him f/u sleep clinic outpt to see if still needs  2/16- IM feels he still has OSA.  11. Pharyngeal mucositis: MMW Qid (was TID) and Nystatin mouth wash qid and trial of nebulized lidocaine added on  2/1. Will also add Diflucan 200 mg x 1 and 100 daily x 6 days.  2/4- added Flonase for sore throat   2/5- improved on exam and by hx   2/15- still a mild issue- will con't Nystatin moutwash 12. CKD Stage IIIa: Monitor BMET with serial checks.   2/3- Cr 1.12 and BUN slightly elevated at 25- a little dry- will encourage fluid intake.   2/6- recheck Monday   2/8: See #7  2/9- reviewed labs- Cr up to 1.52 from 1.39- BUN u to 31; will give IVFs 75 cc/hr x 12 hours and recheck in AM  2/10- gave IVFs 2/9- Cr dropped from 1.52 to  1.34 but BUN still 32- will allow PO intake and recheck Friday since sitl elevated.  2/12- ordered labs- not back- will monitor- will recheck Monday  2/15- Cr stable at 1.34- which is his baseline due to CKD StageIIIa. con't to encourage fluids- order written.   13. H/o recurrent GIB/chronic gastritis 05/2018: PPI recommended indefinitely-- will resume with recent use of NSAIDs as well as steroids on board.  14. Chronic diastolic CHF- will monitor daily weights and treat as required.   2/3- will order daily weights   Filed Weights   03/04/19 0700 03/07/19 0606 03/11/19 0637  Weight: 112.2 kg 112.6 kg 112.4 kg  Weights ranging from 112-115.  No clinical signs of CHF  2/18- weights stable- not likely cause of SOB/DOE 16. Prolonged QT interval  Avoiding medications which prolong 17. Post nasal drip  2/15- on Flonase 2 sprays daily- which is max dose- will suggest Rhinocort at d/c.  18. Dispo  2/16- d/c this week, however has f/u with [place] 2/24 and will need O2 at d/c based on timed testing drop to 83%  2/17- still using 1L at rest; 3L with activity 19. New DOE  2/18- will get CXR, CBC with diff, CMP, and ddimer and IM formal consult  I went back and spoke to pt at length about my concerns- and explained plan as well as IM consult- spent 35 minutes on total care today. More than 18 minutes spent discussing pt concerns and answering questions as well as calling IM consult.    LOS: 16 days A FACE TO FACE EVALUATION WAS PERFORMED  Daran Favaro 03/11/2019, 10:30 AM

## 2019-03-12 MED ORDER — MELATONIN 3 MG PO TABS: 6.0000 mg | ORAL_TABLET | Freq: Every day | ORAL | 0 refills | Status: DC

## 2019-03-12 MED ORDER — PANTOPRAZOLE SODIUM 40 MG PO TBEC: 40.0000 mg | DELAYED_RELEASE_TABLET | Freq: Two times a day (BID) | ORAL | 0 refills | Status: DC

## 2019-03-12 MED ORDER — GUAIFENESIN-DM 100-10 MG/5ML PO SYRP: 10.0000 mL | ORAL_SOLUTION | ORAL | 0 refills | Status: DC | PRN

## 2019-03-12 MED ORDER — SULFAMETHOXAZOLE-TRIMETHOPRIM 800-160 MG PO TABS: 1.0000 | ORAL_TABLET | ORAL | 0 refills | Status: DC

## 2019-03-12 MED ORDER — FUROSEMIDE 40 MG PO TABS: 40.0000 mg | ORAL_TABLET | Freq: Every day | ORAL | 0 refills | Status: DC

## 2019-03-12 MED ORDER — ALBUTEROL SULFATE HFA 108 (90 BASE) MCG/ACT IN AERS: 2.0000 | INHALATION_SPRAY | RESPIRATORY_TRACT | 0 refills | Status: DC | PRN

## 2019-03-12 MED ORDER — SERTRALINE HCL 100 MG PO TABS: 200.0000 mg | ORAL_TABLET | Freq: Every morning | ORAL | Status: AC

## 2019-03-12 MED ORDER — CARVEDILOL 3.125 MG PO TABS: 3.1250 mg | ORAL_TABLET | Freq: Two times a day (BID) | ORAL | 0 refills | Status: AC

## 2019-03-12 MED ORDER — MONTELUKAST SODIUM 10 MG PO TABS: 10.0000 mg | ORAL_TABLET | Freq: Every day | ORAL | 0 refills | Status: DC

## 2019-03-12 MED ORDER — MOMETASONE FURO-FORMOTEROL FUM 100-5 MCG/ACT IN AERO
2.0000 | INHALATION_SPRAY | Freq: Two times a day (BID) | RESPIRATORY_TRACT | Status: DC
  Filled 2019-03-12: qty 8.8

## 2019-03-12 MED ORDER — PREDNISONE 20 MG PO TABS: 40.0000 mg | ORAL_TABLET | Freq: Every day | ORAL | 0 refills | Status: DC

## 2019-03-12 MED ORDER — MOMETASONE FURO-FORMOTEROL FUM 100-5 MCG/ACT IN AERO: 2.0000 | INHALATION_SPRAY | Freq: Two times a day (BID) | RESPIRATORY_TRACT | 0 refills | Status: DC

## 2019-03-12 NOTE — Progress Notes (Signed)
Social Work Patient ID: Kurt [name], male   DOB: 1950/04/12, 69 y.o.   MRN: [id number]    SW returned phone call to [facility] with [facility] [phone]) inquiring about pt d/c date. SW informed that pt will require walking trial after taking Coreg, and will determine if pt is appropriate for discharge as long as he does not become tachycardic. Requests SW to confirm today if pt will discharge so they are able to make a decision if this case needs to be escalated for review.   *SW received updates from team pt will d/c today. SW left message informing on pt d/c today.   SW spoke with [name]/[employer](p:[phone]/[phone])to discuss current o2 order. Pt current order is 2L [place] with concentrator at home and portable tanks. States if pt requires more o2, send in order for 3L [place].   *SW sent updated order. SW updated pt on above changes with regard to home o2.   Cecile Sheerer, MSW, LCSWA Office: [phone] Cell: [phone] Fax: [phone]

## 2019-03-12 NOTE — Progress Notes (Signed)
Social Work Patient ID: Kurt [name], male   DOB: 02-16-1950, 69 y.o.   MRN: [id number]   SW spoke with [name]/[employer] (p:[phone]/f:[phone])to discuss current o2 order. Pt current order is 2L [place] with concentrator at home and portable tanks. States if pt requires more o2, send in order for 3L [place].   *SW sent updated order.   Cecile Sheerer, MSW, LCSWA Office: [phone] Cell: [phone] Fax: [phone]

## 2019-03-12 NOTE — Progress Notes (Signed)
[place] PHYSICAL MEDICINE & REHABILITATION PROGRESS NOTE   Subjective/Complaints:  Pt hasn't tried to walk much since yesterday I started Coreg since yesterday for tachycardia, per recs from IM- will see if right dose/if controls his tachycardia.   Ready to go home otherwise    ROS; denies CP; SOB as above; denies N/V/C/D  Objective:   DG Chest 2 View  Result Date: 03/11/2019 CLINICAL DATA:  Worsening shortness of breath.  History of COVID EXAM: CHEST - 2 VIEW COMPARISON:  03/08/2019 FINDINGS: Heart is upper limits normal in size. Coarsened interstitial opacities throughout the lungs, slightly improved since prior study. No effusions or pneumothorax. No acute bony abnormality. IMPRESSION: Interstitial prominence throughout the lungs, slightly improved since prior study. Electronically Signed   By: Charlett Nose M.D.   On: 03/11/2019 10:44   Recent Labs    03/11/19 1104  WBC 12.9*  HGB 13.7  HCT 42.6  PLT 207   Recent Labs    03/11/19 1104  NA 136  K 3.9  CL 96*  CO2 28  GLUCOSE 110*  BUN 30*  CREATININE 1.38*  CALCIUM 9.2    Intake/Output Summary (Last 24 hours) at 03/12/2019 0951 Last data filed at 03/12/2019 0730 Gross per 24 hour  Intake 580 ml  Output 1325 ml  Net -745 ml     Physical Exam: Vital Signs Blood pressure 126/85, pulse 76, temperature 98 F (36.7 C), temperature source Oral, resp. rate 16, height 5\' 10"  (1.778 m), weight 112.4 kg, SpO2 99 %.  Vitals reviewed; Const: sitting up at bedside; wearing O2 2L sats 100% ; appears comfortable at rest; asked pt to wait to walk til gets Coreg HENT:  Head: Normocephalic and atraumatic.  Mouth/Throat: mild erythema back of oropharynx Eyes: conjugate gaze Neck: No tracheal deviation present.  Cardiovascular: with easy movement, tachycardic to 120s-130s; sounds like in Afib on exam Respiratory: lungs decreased at bases; coughing, choking, hard to breathe, however once heart rate goes down, CTA  B/L GI: Soft, NT, ND, (+)BS Musculoskeletal:        Comments: UEs- deltoid 3-/5 B/L; 5-/5 in arms biceps, triceps, WE, grip, finger abd B/L LEs- HF, KE, KF, DF, PF all 5-/5 B/L, unchanged Neurological: He is alert and oriented to person, place, and time. Decreased sensation to light touch in LEs; and LUE to elbow has decreased sensation to light touch.  Intact sensation in RUE.  Skin: Skin is warm and dry.  Bruise on knee  Psychiatric:  Appropriate this AM; very serious; concerned about medical change         Assessment/Plan: 1. Functional deficits secondary to debility from 3rd case of pneumonia since COVID 12/20- currently pseudomonas pneumonia which require 3+ hours per day of interdisciplinary therapy in a comprehensive inpatient rehab setting.  Physiatrist is providing close team supervision and 24 hour management of active medical problems listed below.  Physiatrist and rehab team continue to assess barriers to discharge/monitor patient progress toward functional and medical goals  Care Tool:  Bathing  Bathing activity did not occur: Safety/medical concerns(declined this session due to fatigue) Body parts bathed by patient: Right arm, Left arm, Chest, Abdomen, Front perineal area, Buttocks, Right upper leg, Left upper leg, Right lower leg, Left lower leg, Face         Bathing assist Assist Level: Independent with assistive device     Upper Body Dressing/Undressing Upper body dressing   What is the patient wearing?: Pull over shirt    Upper body  assist Assist Level: Independent    Lower Body Dressing/Undressing Lower body dressing      What is the patient wearing?: Pants, Underwear/pull up     Lower body assist Assist for lower body dressing: Independent with assitive device     Toileting Toileting    Toileting assist Assist for toileting: Independent with assistive device Assistive Device Comment: urinal   Transfers Chair/bed transfer  Transfers  assist     Chair/bed transfer assist level: Independent with assistive device Chair/bed transfer assistive device: Research officer, political party   Ambulation assist      Assist level: Independent with assistive device Assistive device: Rollator Max distance: 200'   Walk 10 feet activity   Assist     Assist level: Independent with assistive device Assistive device: Cane-straight   Walk 50 feet activity   Assist    Assist level: Independent with assistive device Assistive device: Rollator    Walk 150 feet activity   Assist Walk 150 feet activity did not occur: Safety/medical concerns  Assist level: Independent with assistive device Assistive device: Rollator    Walk 10 feet on uneven surface  activity   Assist Walk 10 feet on uneven surfaces activity did not occur: Safety/medical concerns(decreased activity tolerance/balance)   Assist level: Independent with assistive device Assistive device: Government social research officer Will patient use wheelchair at discharge?: No(patient is a functional ambulator) Type of Wheelchair: Manual Wheelchair activity did not occur: N/A  Wheelchair assist level: Supervision/Verbal cueing Max wheelchair distance: 150'    Wheelchair 50 feet with 2 turns activity    Assist    Wheelchair 50 feet with 2 turns activity did not occur: N/A   Assist Level: Supervision/Verbal cueing   Wheelchair 150 feet activity     Assist  Wheelchair 150 feet activity did not occur: N/A   Assist Level: Supervision/Verbal cueing   Blood pressure 126/85, pulse 76, temperature 98 F (36.7 C), temperature source Oral, resp. rate 16, height 5\' 10"  (1.778 m), weight 112.4 kg, SpO2 99 %.  1.  Impaired mobility, function, and ADLs secondary to debility from  Pseudomonas pneumonia s/p 3rd recent hospitalization for pneumonia- initial hospital stay for COVID pneumonia.    Reduced O2 requirements, still with dry cough we  will schedule his albuterol and monitor  2/15- no improvement with Albuterol- still on Prednisone- have seen this dry hacking cough in many post COVID patients- will see/look into- if there's any other treatment; albuterol NOT working.   2/16- will try Tussoniex with hydrocodone q12 hours prn per IM and switch to Delsym at d/c; also they recommended reducing Prednisone to 40 mg at d/c and taper to off over 2 weeks. Will have to go home on O2, but should be able to wean off In 1-2 months completely  2/17- Slept much better with Tussoniex overnight- cough much improved.   2/18- new onset tachycardia and coughing/choking with simple movement- had pt march in place- Concerned about PE or new onset Afib- got EKG-is sinus tachycardia at 105- called IM consult and discussed pt- decided to get formal consult for new medical issues.              -ELOS/Goals: 2- 2.5 weeks; goals mod I  Continue CIR PT and OT, progressing well.  2.  Antithrombotics: -DVT/anticoagulation:  Pharmaceutical: Lovenox             -antiplatelet therapy: N/a 3. Chronic back pain/Pain Management: Hydrocodone prn  -well controlled;  not requiring Hydrocodone.   4. H/o depression/Mood: team to provide ego support and help with anxiety.              -antipsychotic agents: N/A 5. Neuropsych: This patient is capable of making decisions on his own behalf. 6. Skin/Wound Care: routine pressure relief measures.  7. Fluids/Electrolytes/Nutrition: Monitor I/O.  10. OSA: Encourage CPAP use at nights.  Patient states that he has lost about 50 pounds since his illness.  2/15- refusing CPAP- will have him f/u sleep clinic outpt to see if still needs  2/16- IM feels he still has OSA.  11. Pharyngeal mucositis: MMW Qid (was TID) and Nystatin mouth wash qid and trial of nebulized lidocaine added on  2/1. Will also add Diflucan 200 mg x 1 and 100 daily x 6 days.  2/4- added Flonase for sore throat   2/5- improved on exam and by hx   2/15- still a  mild issue- will con't Nystatin moutwash 12. CKD Stage IIIa: Monitor BMET with serial checks.   2/3- Cr 1.12 and BUN slightly elevated at 25- a little dry- will encourage fluid intake.   2/6- recheck Monday   2/8: See #7  2/9- reviewed labs- Cr up to 1.52 from 1.39- BUN u to 31; will give IVFs 75 cc/hr x 12 hours and recheck in AM  2/10- gave IVFs 2/9- Cr dropped from 1.52 to 1.34 but BUN still 32- will allow PO intake and recheck Friday since sitl elevated.  2/12- ordered labs- not back- will monitor- will recheck Monday  2/15- Cr stable at 1.34- which is his baseline due to CKD StageIIIa. con't to encourage fluids- order written.   13. H/o recurrent GIB/chronic gastritis 05/2018: PPI recommended indefinitely-- will resume with recent use of NSAIDs as well as steroids on board.  14. Chronic diastolic CHF- will monitor daily weights and treat as required.   2/3- will order daily weights   Filed Weights   03/04/19 0700 03/07/19 0606 03/11/19 0637  Weight: 112.2 kg 112.6 kg 112.4 kg  Weights ranging from 112-115.  No clinical signs of CHF  2/18- weights stable- not likely cause of SOB/DOE 16. Prolonged QT interval  Avoiding medications which prolong 17. Post nasal drip  2/15- on Flonase 2 sprays daily- which is max dose- will suggest Rhinocort at d/c.  18. Dispo  2/16- d/c this week, however has f/u with [place] 2/24 and will need O2 at d/c based on timed testing drop to 83%  2/17- still using 1L at rest; 3L with activity 19. New DOE/tachycardia  2/18- will get CXR, CBC with diff, CMP, and ddimer and IM formal consult  2/19- labs looked OK; CXR improved; D Dimer (-); will order Coreg 3.125 mg BID to see if enough to get tachycardia under control- if so, should treat all of Sx's; asked pt to walk 1 hour after Coreg to see if can tolerate; and if tolerates, then can go home; if not will increase Coreg.      LOS: 17 days A FACE TO FACE EVALUATION WAS PERFORMED  Albertus Chiarelli 03/12/2019,  9:51 AM

## 2019-03-12 NOTE — Discharge Instructions (Signed)
Inpatient Rehab Discharge Instructions  [facility] Discharge date and time:  02/819/821  Activities/Precautions/ Functional Status: Activity: no lifting, driving, or strenuous exercise till cleared by MD Diet: regular diet Wound Care: none needed   Functional status:  ___ No restrictions     ___ Walk up steps independently ___ 24/7 supervision/assistance   ___ Walk up steps with assistance _X__ Intermittent supervision/assistance  ___ Bathe/dress independently _X__ Walk with walker    ___ Bathe/dress with assistance ___ Walk Independently    ___ Shower independently ___ Walk with assistance    ___ Shower with assistance _X__ No alcohol     ___ Return to work/school ________    COMMUNITY REFERRALS UPON DISCHARGE:     Outpatient: PT     OT               Agency: [facility] Phone: [phone]              Appointment Date/Time: *Please expect follow-up within 3-5 business days to schedule your visit. If you have not received follow-up, be sure to contact the location directly.*   Medical Equipment/Items Ordered: 3-in-1 Bedside Commode                                                 Agency/Supplier: [facility] [facility] [employer] [phone]    Special Instructions: 1.Recommend having someone with you the first time you drive. Drive short distance.  2. Oxygen 1-2 liters at rest and increase to 3 liters with any activity.   My questions have been answered and I understand these instructions. I will adhere to these goals and the provided educational materials after my discharge from the hospital.  Patient/Caregiver Signature _______________________________ Date __________  Clinician Signature _______________________________________ Date __________  Please bring this form and your medication list with you to all your follow-up doctor's appointments.

## 2019-03-15 NOTE — Progress Notes (Signed)
Social Work Discharge Note   The overall goal for the admission was met for:   Discharge location: Yes  Length of Stay: Yes  Discharge activity level: Yes  Home/community participation: Yes  Services provided included: MD, RD, PT, OT, SLP, RN, CM, TR, Pharmacy, Neuropsych and SW  Financial Services: Other: [employer]  Follow-up services arranged: Outpatient: [facility] [phone];  PT/OT and DME: [facility] [phone] 3in1 BSC (home o2 PTA); Pt private purchase for TTB (delivered to home).   Comments (or additional information): Pt will d/c to home alone. Pt is his own caregiver. Pt has reported to medical team he will seek support for transportation to/from medical appointments.  Patient/Family verbalized understanding of follow-up arrangements: Yes  Individual responsible for coordination of the follow-up plan: Pt will be coordinate his own d/c needs [phone].   Confirmed correct DME delivered: Rana Snare 03/15/2019    .Loralee Pacas, MSW, [place] Office: [phone] Cell: [phone] Fax: [phone]

## 2019-03-17 ENCOUNTER — Encounter: Payer: Self-pay | Admitting: Pulmonary Disease

## 2019-03-17 ENCOUNTER — Ambulatory Visit: Payer: Medicare Other | Admitting: Pulmonary Disease

## 2019-03-17 ENCOUNTER — Other Ambulatory Visit: Payer: Self-pay

## 2019-03-17 DIAGNOSIS — R0602 Shortness of breath: Secondary | ICD-10-CM

## 2019-03-17 NOTE — Patient Instructions (Signed)
We will schedule you for high-resolution CT, PFTs and a 6-minute walk test Continue the supplemental oxygen, inhalers We are awaiting initiation of pulmonary rehab  Start reducing the prednisone by 10 mg every week Go to 1.5 pills (30 mg/day) for 2 weeks and then 1 pill (20 mg/day) for 2 weeks and then half a pill (10 mg/day) for 2 weeks and then stop  Follow-up in clinic in 1 to 2 months.

## 2019-03-17 NOTE — Progress Notes (Signed)
Kurt [name]    [id number]    [birth year]  Primary Care Physician:Elkins, Curt Jews, MD  Referring Physician: Leonard Downing, MD [address] [place],  [place] [address]  Chief complaint: Follow-up for COVID-19 pneumonia, hospitalization for HAP  HPI: 69 y.o.malewith medical history significant ofchronic diastolic congestive heart failure, hypertension, CKD stage III, recentCOVIDpneumonia (12/6-12/16/ 2020) and recurrent pneumonia requiring admission 12/28-01/23/2019 for hypoxia with CT showing multifocal groundglass opacities with cavitary nodules.  Treated with Augmentin and slow steroid taper Readmitted 1/25-02/23/2019 with recurrent respiratory failure with abnormal CT.  Bronchoscopy with BAL performed on 02/18/2019 with negative cultures, 60% lymphocytes.  Sputum cultures showed Pseudomonas and was treated with cefepime.  He was placed on prednisone taper and discharged  States that he is slowly improving over time.  He is on prednisone at 40 mg a day and Bactrim. Continues on supplemental oxygen.  Still has some chronic dyspnea on exertion, cough which is nonproductive in nature.  No fevers or chills  Pets: No pets Occupation: Retired Chief Financial Officer and part-time EMT Exposures: No known exposures.  No mold, hot tub, Jacuzzi Smoking history: Never smoker Travel history: No significant travel history Relevant family history: Father had emphysema.  He was a smoker  Outpatient Encounter Medications as of 03/17/2019  Medication Sig  . acetaminophen (TYLENOL) 500 MG tablet Take 1,000 mg by mouth every 4 (four) hours as needed for headache (pain).  Marland Kitchen albuterol (VENTOLIN HFA) 108 (90 Base) MCG/ACT inhaler Inhale 2 puffs into the lungs every 4 (four) hours as needed for wheezing or shortness of breath.  . allopurinol (ZYLOPRIM) 100 MG tablet Take 100 mg by mouth 2 (two) times a day.  Marland Kitchen amLODipine (NORVASC) 2.5 MG tablet Take 2.5 mg by mouth daily.  .  benzonatate (TESSALON PERLES) 100 MG capsule Take 1 capsule (100 mg total) by mouth 3 (three) times daily as needed for cough.  . carvedilol (COREG) 3.125 MG tablet Take 1 tablet (3.125 mg total) by mouth 2 (two) times daily with a meal.  . ferrous sulfate 324 MG TBEC Take 324 mg by mouth daily with breakfast.  . fluticasone (FLONASE) 50 MCG/ACT nasal spray Place 2 sprays into both nostrils daily.  . furosemide (LASIX) 40 MG tablet Take 1 tablet (40 mg total) by mouth daily.  Marland Kitchen guaiFENesin-dextromethorphan (ROBITUSSIN DM) 100-10 MG/5ML syrup Take 10 mLs by mouth every 4 (four) hours as needed for cough.  . magic mouthwash w/lidocaine SOLN Take 5 mLs by mouth 3 (three) times daily.  . Melatonin 3 MG TABS Take 2 tablets (6 mg total) by mouth at bedtime.  . mometasone-formoterol (DULERA) 100-5 MCG/ACT AERO Inhale 2 puffs into the lungs 2 (two) times daily.  . montelukast (SINGULAIR) 10 MG tablet Take 1 tablet (10 mg total) by mouth at bedtime.  . pantoprazole (PROTONIX) 40 MG tablet Take 1 tablet (40 mg total) by mouth 2 (two) times daily.  . polyethylene glycol (MIRALAX / GLYCOLAX) 17 g packet Take 17 g by mouth daily as needed for mild constipation.  . predniSONE (DELTASONE) 20 MG tablet Take 2 tablets (40 mg total) by mouth daily with breakfast.  . sertraline (ZOLOFT) 100 MG tablet Take 2 tablets (200 mg total) by mouth every morning.  . sulfamethoxazole-trimethoprim (BACTRIM DS) 800-160 MG tablet Take 1 tablet by mouth 3 (three) times a week.   No facility-administered encounter medications on file as of 03/17/2019.   Physical Exam: Blood pressure 134/86, pulse  92, temperature (!) 97 F (36.1 C), temperature source Temporal, height 5' 10.5" (1.791 m), weight 253 lb 6.4 oz (114.9 kg), SpO2 95 %. Gen:      No acute distress HEENT:  EOMI, sclera anicteric Neck:     No masses; no thyromegaly Lungs:    Scattered crackles CV:         Regular rate and rhythm; no murmurs Abd:      + bowel sounds;  soft, non-tender; no palpable masses, no distension Ext:    No edema; adequate peripheral perfusion Skin:      Warm and dry; no rash Neuro: alert and oriented x 3 Psych: normal mood and affect  Data Reviewed: Imaging: CTA 01/19/2019-scattered irregular groundglass opacities, 2 cavitary nodules in the right upper lobe CTA 02/16/2019-seen bilateral groundglass opacities, reduced size of right lung nodules with improvement in cavitation. 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  Marshell Garfinkel MD [facility] 03/17/2019, 9:52 AM  CC: Leonard Downing, *

## 2019-03-19 NOTE — Discharge Summary (Signed)
Physician Discharge Summary  Patient ID: Kurt [name] MRN: [id number] DOB/AGE: 71/07/1950 71 y.o.  Admit date: 02/23/2019 Discharge date: 03/12/2019  Discharge Diagnoses:  Principal Problem:   Physical debility Active Problems:   CKD (chronic kidney disease) stage 3, GFR 30-59 ml/min   Acute on chronic respiratory failure (HCC)   Class 2 obesity with body mass index (BMI) of 36.0 to 36.9 in adult   OSA (obstructive sleep apnea)   Cavitary pneumonia   Diastolic CHF ([place])   Discharged Condition: stable   Significant Diagnostic Studies: DG Chest 2 View  Result Date: 03/11/2019 CLINICAL DATA:  Worsening shortness of breath.  History of COVID EXAM: CHEST - 2 VIEW COMPARISON:  03/08/2019 FINDINGS: Heart is upper limits normal in size. Coarsened interstitial opacities throughout the lungs, slightly improved since prior study. No effusions or pneumothorax. No acute bony abnormality. IMPRESSION: Interstitial prominence throughout the lungs, slightly improved since prior study. Electronically Signed   By: Rolm Baptise M.D.   On: 03/11/2019 10:44   DG Chest 2 View  Result Date: 03/08/2019 CLINICAL DATA:  History of COVID-19 positivity with persistent cough EXAM: CHEST - 2 VIEW COMPARISON:  02/16/2019 FINDINGS: Cardiac shadow is mildly enlarged but stable. Diffuse bilateral opacities are again identified consistent with the patient's given clinical history. These are slightly improved when compared with the prior CT examination. No sizable effusion is noted. Degenerative changes of the thoracic spine are seen. IMPRESSION: Diffuse bilateral opacities consistent with the given clinical history although slightly improved from the prior CT examination. Electronically Signed   By: Inez Catalina M.D.   On: 03/08/2019 12:35    Labs:  Basic Metabolic Panel: BMP Latest Ref Rng & Units 03/11/2019 03/08/2019 03/03/2019  Glucose 70 - 99 mg/dL 110(H) 94 98  BUN 8 - 23 mg/dL 30(H) 30(H) 32(H)  Creatinine 0.61 -  1.24 mg/dL 1.38(H) 1.34(H) 1.34(H)  Sodium 135 - 145 mmol/L 136 138 137  Potassium 3.5 - 5.1 mmol/L 3.9 3.9 4.3  Chloride 98 - 111 mmol/L 96(L) 100 98  CO2 22 - 32 mmol/L _0 Calcium 8.9 - 10.3 mg/dL 9.2 8.9 9.0    CBC: CBC Latest Ref Rng & Units 03/11/2019 03/08/2019 03/03/2019  WBC 4.0 - 10.5 K/uL 12.9(H) 13.7(H) 13.5(H)  Hemoglobin 13.0 - 17.0 g/dL 13.7 12.8(L) 12.1(L)  Hematocrit 39.0 - 52.0 % 42.6 41.2 38.2(L)  Platelets 150 - 400 K/uL 207 259 333    CBG: No results for input(s): GLUCAP in the last 168 hours.  Brief HPI:   Kurt [name] is a 69 y.o. male with history of PTSD, CKD, GI bleed, OSA, COVID-19 diagnosed 12/13/2018 with admission 12/6-12/16 and was discharged home on 2 to 4 L oxygen.  He has had recent readmission 12/28-1/2 for worsening of DOE with decrease in intake and diagnosis of cavitary lesion to be well.  He was readmitted on 02/16/2019 with fever, tachypnea and CTA chest was negative for PE but showed worsening of bilateral groundglass opacities compatible with pneumonia as well as partially cavitary RML nodule and enlarged right paratracheal lymph nodes.  He was started on broad-spectrum antibiotics and underwent bronchoscopy 1/28.  No lesions seen in BAL showed no growth.    Sputum culture showed few Pseudomonas aeruginosa.  ID felt that patient's progressive hypoxia likely due to development of new interstitial lung disease and recommended treating PNA with cefepime through 2/9.  Patient has had issues with shortness of breath requiring 3L oxygen at rest and 6L with  activity. PCCM consulted for input and recommended slow taper of prednisone with Bactrim DS MWF for PGP prophylaxis.  Patient was noted to be debilitated and CIR recommended due to functional decline   Hospital Course: Kurt [name] was admitted to rehab 02/23/2019 for inpatient therapies to consist of PT and OT at least three hours five days a week. Past admission physiatrist, therapy team and rehab RN  have worked together to provide customized collaborative inpatient rehab. Acute on chronic renal failure has improved with SCr down to 1.38. Po intake has been good and anxiety and mood has improved with ego support provided by the team. 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    Protonix was resumed due to history of chronic gastritis. WBC is stable and felt to be due to steroids. Prednisone was tapered to 40 mg daily at discharge. Chronic diastolic CHF has been compensated and weight is down to 112 kg. Respiratory status has improved and he was tapered to one liter oxygen per [place] at rest and 3-4 liters with activity. He did report worsening of cough with activity and pulmonary was contacted for input. Repeat CXR showed improvement and Dulera was added per recommendations.  He did develop tachycardia with increase in activity and low dose coreg was added with improvement in heart rate control.    Rehab course: During patient's stay in rehab weekly team conferences were held to monitor patient's progress, set goals and discuss barriers to discharge. At admission, patient required min assist for ADL tasks and for mobility. He has had improvement in activity tolerance, balance, postural control as well as ability to compensate for deficits.  He is able to complete ADL tasks at modified independent level. He is modified independent for transfers and is able to ambulate > 200' with rollater.   Disposition: Home  Diet: Regular  Special Instructions: 1. Continue to use flutter valve qid. 2. Increase oxygen to 3 liters with activity.    Discharge Instructions    Ambulatory referral to Occupational Therapy   Complete by: As directed    Ambulatory referral to Physical Medicine Rehab   Complete by: As directed    1-2 weeks TC appt   Ambulatory referral to Physical  Therapy   Complete by: As directed    ICD: R53.81, hx of U07.1   Iontophoresis - 4 mg/ml of dexamethasone: No   T.E.N.S. Unit Evaluation and Dispense as Indicated: No     Allergies as of 03/12/2019   No Known Allergies     Medication List    STOP taking these medications   ceFEPIme 2 g in sodium chloride 0.9 % 100 mL   chlorpheniramine-HYDROcodone 10-8 MG/5ML Suer Commonly known as: TUSSIONEX   ipratropium-albuterol 0.5-2.5 (3) MG/3ML Soln Commonly known as: DUONEB   lidocaine 4 % (PF) injection Commonly known as: XYLOCAINE   nystatin 100000 UNIT/ML suspension Commonly known as: MYCOSTATIN   oxymetazoline 0.05 % nasal spray Commonly known as: AFRIN     TAKE these medications   acetaminophen 500 MG tablet Commonly known as: TYLENOL Take 1,000 mg by mouth every 4 (four) hours as needed for headache (pain).   albuterol 108 (90 Base) MCG/ACT inhaler Commonly known as: VENTOLIN HFA Inhale 2 puffs into the lungs every 4 (four) hours as needed for wheezing or shortness of breath.   allopurinol 100 MG tablet Commonly known as:  ZYLOPRIM Take 100 mg by mouth 2 (two) times a day.   amLODipine 2.5 MG tablet Commonly known as: NORVASC Take 2.5 mg by mouth daily.   benzonatate 100 MG capsule Commonly known as: Tessalon Perles Take 1 capsule (100 mg total) by mouth 3 (three) times daily as needed for cough.   carvedilol 3.125 MG tablet Commonly known as: COREG Take 1 tablet (3.125 mg total) by mouth 2 (two) times daily with a meal.   ferrous sulfate 324 MG Tbec Take 324 mg by mouth daily with breakfast.   fluticasone 50 MCG/ACT nasal spray Commonly known as: FLONASE Place 2 sprays into both nostrils daily.   furosemide 40 MG tablet Commonly known as: LASIX Take 1 tablet (40 mg total) by mouth daily.   guaiFENesin-dextromethorphan 100-10 MG/5ML syrup Commonly known as: ROBITUSSIN DM Take 10 mLs by mouth every 4 (four) hours as needed for cough.   magic mouthwash  w/lidocaine Soln Take 5 mLs by mouth 3 (three) times daily.   Melatonin 3 MG Tabs Take 2 tablets (6 mg total) by mouth at bedtime.   mometasone-formoterol 100-5 MCG/ACT Aero Commonly known as: DULERA Inhale 2 puffs into the lungs 2 (two) times daily.   montelukast 10 MG tablet Commonly known as: SINGULAIR Take 1 tablet (10 mg total) by mouth at bedtime.   pantoprazole 40 MG tablet Commonly known as: PROTONIX Take 1 tablet (40 mg total) by mouth 2 (two) times daily.   polyethylene glycol 17 g packet Commonly known as: MIRALAX / GLYCOLAX Take 17 g by mouth daily as needed for mild constipation.   predniSONE 20 MG tablet Commonly known as: DELTASONE Take 2 tablets (40 mg total) by mouth daily with breakfast. What changed: how much to take   sertraline 100 MG tablet Commonly known as: ZOLOFT Take 2 tablets (200 mg total) by mouth every morning.   sulfamethoxazole-trimethoprim 800-160 MG tablet Commonly known as: BACTRIM DS Take 1 tablet by mouth 3 (three) times a week.      Follow-up Information    Lovorn, Jinny Blossom, MD Follow up.   Specialty: Physical Medicine and Rehabilitation Why: Office will call you with follow up appointment Contact information: [address] [place] [address] [phone]        Marshell Garfinkel, MD Follow up on 03/17/2019.   Specialty: Pulmonary Disease Why: Appointment at 9:30 am Contact information: [place] [address] [phone]        Leonard Downing, MD. Call on 03/15/2019.   Specialty: Family Medicine Why: For post hospital follow up Contact information: [place] [address] [phone]           Signed: Bary Leriche 03/21/2019, 10:55 PM

## 2019-03-20 LAB — FUNGAL ORGANISM REFLEX

## 2019-03-20 LAB — FUNGUS CULTURE WITH STAIN

## 2019-03-20 LAB — FUNGUS CULTURE RESULT

## 2019-03-22 ENCOUNTER — Encounter: Payer: Self-pay | Admitting: Physical Medicine and Rehabilitation

## 2019-03-22 ENCOUNTER — Other Ambulatory Visit: Payer: Self-pay

## 2019-03-22 ENCOUNTER — Encounter
Payer: Medicare Other | Attending: Physical Medicine and Rehabilitation | Admitting: Physical Medicine and Rehabilitation

## 2019-03-22 ENCOUNTER — Ambulatory Visit: Payer: Medicare Other

## 2019-03-22 VITALS — BP 123/79 | HR 85 | Temp 98.2°F | Ht 70.5 in | Wt 253.0 lb

## 2019-03-22 DIAGNOSIS — R269 Unspecified abnormalities of gait and mobility: Secondary | ICD-10-CM | POA: Insufficient documentation

## 2019-03-22 DIAGNOSIS — G5612 Other lesions of median nerve, left upper limb: Secondary | ICD-10-CM | POA: Diagnosis present

## 2019-03-22 DIAGNOSIS — Z9981 Dependence on supplemental oxygen: Secondary | ICD-10-CM | POA: Insufficient documentation

## 2019-03-22 DIAGNOSIS — J189 Pneumonia, unspecified organism: Secondary | ICD-10-CM | POA: Insufficient documentation

## 2019-03-22 DIAGNOSIS — J984 Other disorders of lung: Secondary | ICD-10-CM | POA: Insufficient documentation

## 2019-03-22 DIAGNOSIS — R5381 Other malaise: Secondary | ICD-10-CM | POA: Diagnosis present

## 2019-03-22 MED ORDER — PREGABALIN 75 MG PO CAPS: 75.0000 mg | ORAL_CAPSULE | Freq: Two times a day (BID) | ORAL | 0 refills | Status: DC

## 2019-03-22 MED ORDER — PREGABALIN 150 MG PO CAPS: 150.0000 mg | ORAL_CAPSULE | Freq: Two times a day (BID) | ORAL | 3 refills | Status: DC

## 2019-03-22 NOTE — Patient Instructions (Signed)
Patient is a 69 yr old male with hx of COVID pneumonia, and severe debility with LUE weakness/sensory deficits- here for hospital F/U- L carpal tunnel/median neuropathy/higher compression.   1. Lyrica/Pregabalin- 75 mg 2x/day x 1 week then 50 mg 2x/day- for nerve pain.   2. Continue oxygen- wean AS TOLERATED, if possible.   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   4. Can't use Duloxetine since already on Zoloft 200 mg daily-   5. Call me in 1 week if haven't heard from outpatient therapy- I' can reorder it. [phone] or Mychart.   6. F/U in 8 weeks to see how neuropathy is doing.

## 2019-03-22 NOTE — Progress Notes (Signed)
Subjective:    Patient ID: Kurt [name], male    DOB: [birth year], 69 y.o.   MRN: [id number]  HPI Patient is a 69 yr old male with hx of COVID pneumonia, and severe debility with LUE weakness/sensory deficits- here for hospital F/U  Has a handicapped placard- had to walk 15 minutes to get in here.   1L at rest and ~3L with activity.    Going to office- 5 miles from house 1x/day- 3 hours/day.  Goes to post office- groceries still delivered.  Able to do laundry and dishes himself now.   Living alone.  Just driving- didn't get approval. Wouldn't if didn't think he could.    If it wasn't for the breathing, a little strength back. Feels fine otherwise.  Hot flashes still. Due to prednisone- not much difference in hot flashes with decrease of Steroids.    Prednisone 20 mg daily-as of this AM-  til next week and then 10 mg daily for ~ 1 week then stop.   Stopped in middle of walking- didn't want to get heart rate up so much.  Keeps a good check on BP and pulse- the most he's seen it is 126- pulse- O2 had dropped to 86% on 1L- within 10 minutes after sitting down was back down to normal.   Motorcycle taunting him- too weak to ride it now.   Son cleaned and covered motorcycle for him.   Wanted outpatient therapy- but hasn't heard anything.  They haven't been in contact yet for outpt therapy.   Seen [facility]- last week- went well. To do 6 minute walk tomorrow.  And has OSA testing testing soon.  Scheduled for chest CT as well to follow up on pulmonary nodule.  Doesn't remember the date- Thinks it's end of March.   Doing HEP from hospital every day.  No issues with that.   Still having tingling in LUE- hand and arm as well as chronic in feet from before.    Pain Inventory Average Pain 0 Pain Right Now 0 My pain is na  In the last 24 hours, has pain interfered with the following? General activity 0 Relation with others 0 Enjoyment of life 0 What TIME of day is your pain  at its worst? morning Sleep (in general) Fair  Pain is worse with: walking and standing Pain improves with: rest and pacing activities Relief from Meds: 0  Mobility walk with assistance use a cane use a walker ability to climb steps?  yes do you drive?  yes  Function Do you have any goals in this area?  no  Neuro/Psych weakness numbness tingling trouble walking  Prior Studies Any changes since last visit?  no  Physicians involved in your care Primary care .   No family history on file. Social History   Socioeconomic History  . Marital status: Widowed    Spouse name: Not on file  . Number of children: Not on file  . Years of education: Not on file  . Highest education level: Not on file  Occupational History  . Not on file  Tobacco Use  . Smoking status: Never Smoker  . Smokeless tobacco: Never Used  Substance and Sexual Activity  . Alcohol use: Not Currently    Comment: quit in 1989   . Drug use: No  . Sexual activity: Not Currently  Other Topics Concern  . Not on file  Social History Narrative  . Not on file   Social Determinants of Health  Financial Resource Strain:   . Difficulty of Paying Living Expenses: Not on file  Food Insecurity: Unknown  . Worried About Charity fundraiser in the Last Year: Patient refused  . Ran Out of Food in the Last Year: Patient refused  Transportation Needs: Unknown  . Lack of Transportation (Medical): Patient refused  . Lack of Transportation (Non-Medical): Patient refused  Physical Activity: Unknown  . Days of Exercise per Week: 3 days  . Minutes of Exercise per Session: Not on file  Stress: No Stress Concern Present  . Feeling of Stress : Only a little  Social Connections: Unknown  . Frequency of Communication with Friends and Family: Patient refused  . Frequency of Social Gatherings with Friends and Family: Patient refused  . Attends Religious Services: Patient refused  . Active Member of Clubs or  Organizations: Patient refused  . Attends Archivist Meetings: Patient refused  . Marital Status: Patient refused   Past Surgical History:  Procedure Laterality Date  . BIOPSY  06/16/2018   Procedure: BIOPSY;  Surgeon: Irene Shipper, MD;  Location: [facility] ENDOSCOPY;  Service: Endoscopy;;  . ELBOW FRACTURE SURGERY     ORIF, Shoulder manipulation    . ESOPHAGOGASTRODUODENOSCOPY N/A 04/30/2014   Procedure: ESOPHAGOGASTRODUODENOSCOPY (EGD);  Surgeon: Carol Ada, MD;  Location: [facility] ENDOSCOPY;  Service: Endoscopy;  Laterality: N/A;  . ESOPHAGOGASTRODUODENOSCOPY (EGD) WITH PROPOFOL N/A 06/16/2018   Procedure: ESOPHAGOGASTRODUODENOSCOPY (EGD) WITH PROPOFOL;  Surgeon: Irene Shipper, MD;  Location: [facility] ENDOSCOPY;  Service: Endoscopy;  Laterality: N/A;  . HEMOSTASIS CLIP PLACEMENT  06/16/2018   Procedure: HEMOSTASIS CLIP PLACEMENT;  Surgeon: Irene Shipper, MD;  Location: [facility] ENDOSCOPY;  Service: Endoscopy;;  . LUMBAR LAMINECTOMY/DECOMPRESSION MICRODISCECTOMY N/A 02/16/2015   Procedure: Thoracic ten - thoracic twelve laminectomy;  Surgeon: Earnie Larsson, MD;  Location: [place] NEURO ORS;  Service: Neurosurgery;  Laterality: N/A;  . LUMBAR LAMINECTOMY/DECOMPRESSION MICRODISCECTOMY N/A 03/22/2016   Procedure: Lumbar One-Two, Lumbar Two-Three, Lumbar Three-Four Laminectomy and Foraminotomy;  Surgeon: Earnie Larsson, MD;  Location: [place];  Service: Neurosurgery;  Laterality: N/A;  . NASAL SEPTUM SURGERY    . SCLEROTHERAPY  06/16/2018   Procedure: SCLEROTHERAPY;  Surgeon: Irene Shipper, MD;  Location: [facility] ENDOSCOPY;  Service: Endoscopy;;  . VIDEO BRONCHOSCOPY Bilateral 02/18/2019   Procedure: VIDEO BRONCHOSCOPY WITHOUT FLUORO;  Surgeon: Margaretha Seeds, MD;  Location: [place];  Service: Pulmonary;  Laterality: Bilateral;   Past Medical History:  Diagnosis Date  . Arthritis   . CHF (congestive heart failure) (HCC)    diastolic dysfunction  . Chronic back pain   . COVID-19   . Depression   . Diverticulitis   .  Duodenal ulcer 05/2018   Treated with 3 clips/Dr. Henrene Pastor  . Fall off scafolding 2003   prolonged hospitalization- multiple BUE surgery, concussion  . GI bleed 05/2018  . GIB (gastrointestinal bleeding) 2016   Antral ulcer/duodenal bleed per Endoscopy by Dr. Benson Norway  . Hypertension   . PTSD (post-traumatic stress disorder)    after  a Fall   . Renal insufficiency    Decrease in GFR- PCP removed all NSAIDS & aspirin    BP 123/79   Pulse 85   Temp 98.2 F (36.8 C)   Ht 5' 10.5" (1.791 m)   Wt 253 lb (114.8 kg)   SpO2 93%   BMI 35.79 kg/m   Opioid Risk Score:   Fall Risk Score:  `1  Depression screen PHQ 2/9  Depression screen [facility] 2/9 03/22/2019  Decreased Interest 0  Down, Depressed, Hopeless 0  PHQ - 2 Score 0  Altered sleeping 0  Tired, decreased energy 1  Change in appetite 0  Feeling bad or failure about yourself  0  Trouble concentrating 0  Moving slowly or fidgety/restless 0  Suicidal thoughts 0  PHQ-9 Score 1  Difficult doing work/chores Not difficult at all   Review of Systems  Musculoskeletal: Positive for gait problem.  Neurological: Positive for weakness and numbness.       Tingling  Has increased weakness in LUE as well with tingling and sometimes cramping which will cause hand to draw up- if uses too much, will get burning pain. Not on nerve pain meds.  Otherwise, an entire ROS was completed and negative except for HPI.      Objective:   Physical Exam Awake, alert, appropriate, using O2, and using single point cane, NAD RRR- no M/R/G CTA B/L- has slightly decreased breath sounds diffusely, but good air movement overall;  abd soft, NT, ND, (+)BS L hand- thenar eminence atrophy- notable on both hands Signs of carpal tunnel surgery at L wrist- scar Decreased sensation in all fingers, but worse in 1st-3rd and inner aspect of 4th digit Less impaired sensation in lateral half of 4th digit and 5th digit MS: decrease grip and finger abduction on L compared ot  right- R was 5/5 in both muscles- L is 4/5 in grip and 4-/5 in finger abduction.  tinels' (-) at wrist and elbow         Assessment & Plan:   Patient is a 69 yr old male with hx of COVID pneumonia, and severe debility with LUE weakness/sensory deficits- here for hospital F/U- L carpal tunnel/median neuropathy/higher compression.   1. Lyrica/Pregabalin- 75 mg 2x/day x 1 week then 50 mg 2x/day- for nerve pain.   2. Continue oxygen- wean AS TOLERATED, if possible.   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   4. Can't use Duloxetine since already on Zoloft 200 mg daily-   5. Call me in 1 week if haven't heard from outpatient therapy- I' can reorder it. [phone] or Mychart.   6. F/U in 8 weeks to see how neuropathy is doing.

## 2019-03-23 ENCOUNTER — Ambulatory Visit: Payer: Medicare Other

## 2019-03-25 ENCOUNTER — Ambulatory Visit (HOSPITAL_COMMUNITY)
Admission: RE | Admit: 2019-03-25 | Discharge: 2019-03-25 | Disposition: A | Discharge status: Home / Self Care | Payer: Medicare Other | Source: Ambulatory Visit | Attending: Pulmonary Disease | Admitting: Pulmonary Disease

## 2019-03-25 ENCOUNTER — Other Ambulatory Visit: Payer: Self-pay

## 2019-03-25 DIAGNOSIS — R0602 Shortness of breath: Secondary | ICD-10-CM | POA: Insufficient documentation

## 2019-03-30 ENCOUNTER — Ambulatory Visit: Payer: Medicare Other

## 2019-04-01 ENCOUNTER — Ambulatory Visit (HOSPITAL_BASED_OUTPATIENT_CLINIC_OR_DEPARTMENT_OTHER): Payer: Medicare Other | Attending: Pulmonary Disease | Admitting: Pulmonary Disease

## 2019-04-01 ENCOUNTER — Other Ambulatory Visit: Payer: Self-pay

## 2019-04-01 DIAGNOSIS — G4733 Obstructive sleep apnea (adult) (pediatric): Secondary | ICD-10-CM | POA: Insufficient documentation

## 2019-04-01 DIAGNOSIS — Z6836 Body mass index (BMI) 36.0-36.9, adult: Secondary | ICD-10-CM | POA: Insufficient documentation

## 2019-04-01 DIAGNOSIS — E669 Obesity, unspecified: Secondary | ICD-10-CM | POA: Diagnosis not present

## 2019-04-01 DIAGNOSIS — G4736 Sleep related hypoventilation in conditions classified elsewhere: Secondary | ICD-10-CM | POA: Insufficient documentation

## 2019-04-01 DIAGNOSIS — R0681 Apnea, not elsewhere classified: Secondary | ICD-10-CM

## 2019-04-01 DIAGNOSIS — R0902 Hypoxemia: Secondary | ICD-10-CM | POA: Insufficient documentation

## 2019-04-02 DIAGNOSIS — R0681 Apnea, not elsewhere classified: Secondary | ICD-10-CM

## 2019-04-02 NOTE — Procedures (Signed)
    Patient Name: Kurt [name], Kurt [name] Date: 04/01/2019 Gender: Male D.O.B: [birth year] Age (years): 77 Referring Provider: Chi Mechele Collin Height (inches): 70 Interpreting Physician: Coralyn Helling MD, ABSM Weight (lbs): 248 RPSGT: Cherylann Parr BMI: 36 MRN: [id number] Neck Size: 21.00  CLINICAL INFORMATION Sleep Study Type: NPSG  Indication for sleep study: Fatigue, Obesity, Snoring, Witnesses Apnea / Gasping During Sleep  Epworth Sleepiness Score: 4  SLEEP STUDY TECHNIQUE As per the AASM Manual for the Scoring of Sleep and Associated Events v2.3 (April 2016) with a hypopnea requiring 4% desaturations.  The channels recorded and monitored were frontal, central and occipital EEG, electrooculogram (EOG), submentalis EMG (chin), nasal and oral airflow, thoracic and abdominal wall motion, anterior tibialis EMG, snore microphone, electrocardiogram, and pulse oximetry.  MEDICATIONS Medications self-administered by patient taken the night of the study : N/A  SLEEP ARCHITECTURE The study was initiated at 11:25:30 PM and ended at 5:34:47 AM.  Sleep onset time was 7.2 minutes and the sleep efficiency was 95.0%%. The total sleep time was 351 minutes.  Stage REM latency was 243.0 minutes.  The patient spent 3.0%% of the night in stage N1 sleep, 87.2%% in stage N2 sleep, 0.0%% in stage N3 and 9.8% in REM.  Alpha intrusion was absent.  Supine sleep was 100.00%.  RESPIRATORY PARAMETERS The overall apnea/hypopnea index (AHI) was 41.4 per hour. There were 160 total apneas, including 159 obstructive, 0 central and 1 mixed apneas. There were 82 hypopneas and 0 RERAs.  The AHI during Stage REM sleep was 71.3 per hour.  AHI while supine was 41.4 per hour.  The mean oxygen saturation was 89.4%. The minimum SpO2 during sleep was 70.0%.  loud snoring was noted during this study.  CARDIAC DATA The 2 lead EKG demonstrated sinus rhythm. The mean heart rate was 70.4 beats per minute. Other  EKG findings include: None.  LEG MOVEMENT DATA The total PLMS were 0 with a resulting PLMS index of 0.0. Associated arousal with leg movement index was 0.0 .  IMPRESSIONS - Severe obstructive sleep apnea with an AHI of 41.4 and SpO2 low of 70%. - Two liters supplemental oxygen applied due to persistent hypoxemia. - He did not meet split night study criteria and was not tried on CPAP during this study.  DIAGNOSIS - Obstructive Sleep Apnea (327.23 [G47.33 ICD-10]) - Nocturnal Hypoxemia (327.26 [G47.36 ICD-10])  RECOMMENDATIONS - Therapeutic CPAP titration to determine optimal pressure required to alleviate sleep disordered breathing.  [Electronically signed] 04/02/2019 05:00 PM  Coralyn Helling MD, ABSM Diplomate, American Board of Sleep Medicine   NPI: [id number]

## 2019-04-03 ENCOUNTER — Other Ambulatory Visit: Payer: Self-pay | Admitting: Physical Medicine and Rehabilitation

## 2019-04-06 LAB — ACID FAST CULTURE WITH REFLEXED SENSITIVITIES (MYCOBACTERIA): Acid Fast Culture: NEGATIVE

## 2019-04-07 ENCOUNTER — Other Ambulatory Visit: Payer: Self-pay

## 2019-04-07 ENCOUNTER — Telehealth: Payer: Self-pay | Admitting: Pulmonary Disease

## 2019-04-07 ENCOUNTER — Ambulatory Visit (INDEPENDENT_AMBULATORY_CARE_PROVIDER_SITE_OTHER): Payer: Medicare Other

## 2019-04-07 DIAGNOSIS — R0602 Shortness of breath: Secondary | ICD-10-CM

## 2019-04-07 NOTE — Progress Notes (Signed)
Six Minute Walk - 04/07/19 1138      Six Minute Walk   Medications taken before test (dose and time)  amilodipine 2.5mg ,ferrous sulfate 324mg  ,lasix 40mg ,lyrica 75mg ,sertraline 100mg ,bactrim DS    Supplemental oxygen during test?  No    Lap distance in meters   34 meters    Laps Completed  6    Partial lap (in meters)  7 meters    Baseline BP (sitting)  130/64    Baseline Heartrate  81    Baseline Dyspnea (Borg Scale)  2    Baseline Fatigue (Borg Scale)  2    Baseline SPO2  96 %   RA     Interval Oxygen Saturation and HR    2 Minute Oxygen Saturation %  --   RA   4 Minute Oxygen Saturation %  --   RA     End of Test Values    BP (sitting)  140/82    Heartrate  93    Dyspnea (Borg Scale)  2    Fatigue (Borg Scale)  1    SPO2  87 %   RA     2 Minutes Post Walk Values   BP (sitting)  136/80    Heartrate  84    SPO2  98 %   RA   Stopped or paused before six minutes?  No    Other Symptoms at end of exercise:  --   patient denies any symptoms     Interpretation   Distance completed  211 meters    Tech Comments:  Patient ambulated at a steady pace, with walker.  Patient sats were 87% on RA at the end of 6 min walk, sats immediately started climbing into the 90's. Patient stated he has O2, that he uses prn, & night

## 2019-04-08 ENCOUNTER — Ambulatory Visit: Payer: Medicare Other

## 2019-04-09 NOTE — Progress Notes (Signed)
LMTCB x 1 

## 2019-04-12 ENCOUNTER — Other Ambulatory Visit: Payer: Self-pay | Admitting: Physical Medicine and Rehabilitation

## 2019-04-12 NOTE — Progress Notes (Signed)
LMTCB x2  

## 2019-04-13 NOTE — Progress Notes (Signed)
I called and spoke with the patient and made him aware of his results.He verbalized understanding.  I have placed an order for the CPAP titration study.

## 2019-04-13 NOTE — Addendum Note (Signed)
Addended by: Edwina Barth I on: 04/13/2019 09:27 AM   Modules accepted: Orders

## 2019-05-17 ENCOUNTER — Encounter: Payer: Self-pay | Admitting: Physical Medicine and Rehabilitation

## 2019-05-17 ENCOUNTER — Encounter
Payer: Medicare Other | Attending: Physical Medicine and Rehabilitation | Admitting: Physical Medicine and Rehabilitation

## 2019-05-17 ENCOUNTER — Other Ambulatory Visit: Payer: Self-pay

## 2019-05-17 VITALS — BP 169/90 | HR 75 | Temp 97.7°F | Ht 70.0 in | Wt 261.0 lb

## 2019-05-17 DIAGNOSIS — J984 Other disorders of lung: Secondary | ICD-10-CM | POA: Diagnosis present

## 2019-05-17 DIAGNOSIS — R5381 Other malaise: Secondary | ICD-10-CM | POA: Insufficient documentation

## 2019-05-17 DIAGNOSIS — R269 Unspecified abnormalities of gait and mobility: Secondary | ICD-10-CM | POA: Insufficient documentation

## 2019-05-17 DIAGNOSIS — G5622 Lesion of ulnar nerve, left upper limb: Secondary | ICD-10-CM | POA: Diagnosis not present

## 2019-05-17 DIAGNOSIS — Z9981 Dependence on supplemental oxygen: Secondary | ICD-10-CM | POA: Insufficient documentation

## 2019-05-17 DIAGNOSIS — J189 Pneumonia, unspecified organism: Secondary | ICD-10-CM | POA: Insufficient documentation

## 2019-05-17 DIAGNOSIS — G5612 Other lesions of median nerve, left upper limb: Secondary | ICD-10-CM

## 2019-05-17 MED ORDER — LEVETIRACETAM 500 MG PO TABS: 500.0000 mg | ORAL_TABLET | Freq: Two times a day (BID) | ORAL | 5 refills | Status: DC

## 2019-05-17 NOTE — Progress Notes (Signed)
Subjective:    Patient ID: Kurt [name], male    DOB: 1950-06-25, 69 y.o.   MRN: [id number]  HPI  Patient is a 69 yr old male with hx of COVID pneumonia, and severe debility with LUE weakness/sensory deficits- here for hospital F/U- L carpal tunnel/median neuropathy/higher compression.     Fell 3x- 1 time busted lips- 4 stitches in gum/inner mouth. Messed up L shoulder and bruised ribs and L hip.  Stopped it- after cutting it in half- been off 2+ weeks.   Helped Sx's tremendously.    Is off O2, except at night- uses 2 L- hasn't had to use lately.  Will get a little winded- can get down 84%- but takes 1 minutes and is back up to 95% almost immediately.     Got call from [place]- to set up sleep study- but hasn't heard anything.    L thumb is almost completely numb on L now.  Takes 1000 mg 2x/day of tylenol but cannot take Aleve or NSAIDs.  But usually now only 1x so can take melatonin 7 mg and tylenol pm- 2 tabs at night.   Rode motorcycle yesterday but legs still a little weak-  Went ~ 150 miles yesterday.  An issue getting on and off since L hip is so sore.    Most nights, gets 3-4 hours/sleep at night.   Still has aches and pains and bruising easily, like on ASA/or coumadin.  Still gets that achy feeling.   Goes to pool therapy 3x/week and c/o L hip and back pain if walks a far distance.    Pain Inventory Average Pain 5 Pain Right Now 3 My pain is burning, dull, tingling and aching  In the last 24 hours, has pain interfered with the following? General activity 5 Relation with others 4 Enjoyment of life 5 What TIME of day is your pain at its worst? evening Sleep (in general) Poor  Pain is worse with: walking, sitting and standing Pain improves with: nothing Relief from Meds: 0  Mobility walk with assistance use a cane ability to climb steps?  yes do you drive?  yes  Function retired  Neuro/Psych weakness numbness tingling trouble  walking  Prior Studies Any changes since last visit?  no  Physicians involved in your care Any changes since last visit?  no   No family history on file. Social History   Socioeconomic History  . Marital status: Widowed    Spouse name: Not on file  . Number of children: Not on file  . Years of education: Not on file  . Highest education level: Not on file  Occupational History  . Not on file  Tobacco Use  . Smoking status: Never Smoker  . Smokeless tobacco: Never Used  Substance and Sexual Activity  . Alcohol use: Not Currently    Comment: quit in 1989   . Drug use: No  . Sexual activity: Not Currently  Other Topics Concern  . Not on file  Social History Narrative  . Not on file   Social Determinants of Health   Financial Resource Strain:   . Difficulty of Paying Living Expenses:   Food Insecurity: Unknown  . Worried About Charity fundraiser in the Last Year: Patient refused  . Ran Out of Food in the Last Year: Patient refused  Transportation Needs: Unknown  . Lack of Transportation (Medical): Patient refused  . Lack of Transportation (Non-Medical): Patient refused  Physical Activity: Unknown  . Days  of Exercise per Week: 3 days  . Minutes of Exercise per Session: Not on file  Stress: No Stress Concern Present  . Feeling of Stress : Only a little  Social Connections: Unknown  . Frequency of Communication with Friends and Family: Patient refused  . Frequency of Social Gatherings with Friends and Family: Patient refused  . Attends Religious Services: Patient refused  . Active Member of Clubs or Organizations: Patient refused  . Attends Banker Meetings: Patient refused  . Marital Status: Patient refused   Past Surgical History:  Procedure Laterality Date  . BIOPSY  06/16/2018   Procedure: BIOPSY;  Surgeon: Hilarie Fredrickson, MD;  Location: [facility] ENDOSCOPY;  Service: Endoscopy;;  . ELBOW FRACTURE SURGERY     ORIF, Shoulder manipulation    .  ESOPHAGOGASTRODUODENOSCOPY N/A 04/30/2014   Procedure: ESOPHAGOGASTRODUODENOSCOPY (EGD);  Surgeon: Jeani Hawking, MD;  Location: [facility] ENDOSCOPY;  Service: Endoscopy;  Laterality: N/A;  . ESOPHAGOGASTRODUODENOSCOPY (EGD) WITH PROPOFOL N/A 06/16/2018   Procedure: ESOPHAGOGASTRODUODENOSCOPY (EGD) WITH PROPOFOL;  Surgeon: Hilarie Fredrickson, MD;  Location: [facility] ENDOSCOPY;  Service: Endoscopy;  Laterality: N/A;  . HEMOSTASIS CLIP PLACEMENT  06/16/2018   Procedure: HEMOSTASIS CLIP PLACEMENT;  Surgeon: Hilarie Fredrickson, MD;  Location: [facility] ENDOSCOPY;  Service: Endoscopy;;  . LUMBAR LAMINECTOMY/DECOMPRESSION MICRODISCECTOMY N/A 02/16/2015   Procedure: Thoracic ten - thoracic twelve laminectomy;  Surgeon: Julio Sicks, MD;  Location: [facility] NEURO ORS;  Service: Neurosurgery;  Laterality: N/A;  . LUMBAR LAMINECTOMY/DECOMPRESSION MICRODISCECTOMY N/A 03/22/2016   Procedure: Lumbar One-Two, Lumbar Two-Three, Lumbar Three-Four Laminectomy and Foraminotomy;  Surgeon: Julio Sicks, MD;  Location: [facility] OR;  Service: Neurosurgery;  Laterality: N/A;  . NASAL SEPTUM SURGERY    . SCLEROTHERAPY  06/16/2018   Procedure: SCLEROTHERAPY;  Surgeon: Hilarie Fredrickson, MD;  Location: [facility] ENDOSCOPY;  Service: Endoscopy;;  . VIDEO BRONCHOSCOPY Bilateral 02/18/2019   Procedure: VIDEO BRONCHOSCOPY WITHOUT FLUORO;  Surgeon: Luciano Cutter, MD;  Location: [facility] ENDOSCOPY;  Service: Pulmonary;  Laterality: Bilateral;   Past Medical History:  Diagnosis Date  . Arthritis   . CHF (congestive heart failure) (HCC)    diastolic dysfunction  . Chronic back pain   . COVID-19   . Depression   . Diverticulitis   . Duodenal ulcer 05/2018   Treated with 3 clips/Dr. Marina Goodell  . Fall off scafolding 2003   prolonged hospitalization- multiple BUE surgery, concussion  . GI bleed 05/2018  . GIB (gastrointestinal bleeding) 2016   Antral ulcer/duodenal bleed per Endoscopy by Dr. Elnoria Howard  . Hypertension   . PTSD (post-traumatic stress disorder)    after  a Fall   . Renal  insufficiency    Decrease in GFR- PCP removed all NSAIDS & aspirin    BP (!) 169/90   Pulse 75   Temp 97.7 F (36.5 C)   Ht 5\' 10"  (1.778 m)   Wt 261 lb (118.4 kg)   SpO2 95%   BMI 37.45 kg/m   Opioid Risk Score:   Fall Risk Score:  `1  Depression screen PHQ 2/9  Depression screen PHQ 2/9 03/22/2019  Decreased Interest 0  Down, Depressed, Hopeless 0  PHQ - 2 Score 0  Altered sleeping 0  Tired, decreased energy 1  Change in appetite 0  Feeling bad or failure about yourself  0  Trouble concentrating 0  Moving slowly or fidgety/restless 0  Suicidal thoughts 0  PHQ-9 Score 1  Difficult doing work/chores Not difficult at all    Review of Systems  Constitutional: Negative.   HENT: Negative.   Eyes: Negative.   Respiratory: Negative.   Cardiovascular: Negative.   Gastrointestinal: Negative.   Endocrine: Negative.   Genitourinary: Negative.   Musculoskeletal: Positive for arthralgias, back pain and gait problem.  Skin: Negative.   Allergic/Immunologic: Negative.   Neurological: Positive for weakness and numbness.       Tingling   Hematological: Bruises/bleeds easily.  Psychiatric/Behavioral: Negative.   All other systems reviewed and are negative.      Objective:   Physical Exam BP 169/90 this AM (yesterday  Per pt 151/60 and 145/55)  Awake, alert, appropriate, using cane to walk, NAD (+) Tinel's on L with ulnar neuropathy Sx's (+) tinel's at wrist, but  Severe numbness in 1st to 3rd fingers on L hand 4th digit a little more on medial aspect than lateral aspect.  5th digit also somewhat numb Appears ot have median and ulnar neuropathy S/p L carpal tunnel syndrome surgery L grip weaker than R; finger abd a little weaker as well- 5-/5 on R Thumb flexion and abduction are ~4-/5 on L; 5/5 on R Can make claw and fist with no difficulties Thenar eminence is flat on L; a little humped on R- more like normal.      Assessment & Plan:  Patient is a 69 yr old male  with hx of COVID pneumonia, and severe debility with LUE weakness/sensory deficits- here for hospital F/U- L carpal tunnel and ulnar neuropathy based on exam- median neuropathy/higher compression? Since s/p carpal tunnel surgery/decompression.    1. Schedule him for EMG/NCS with office as son as we can.   2. Keppra 500 mg 2x/day x 2 weeks then 1000 mg 2/day- for nerve pain.  Went over side effects- sedation, irritability   3. F/U in 4-6 weeks- decided against trileptal- because of Sodium.   Spent a total of 25 minutes on appointment

## 2019-05-17 NOTE — Patient Instructions (Signed)
Patient is a 69 yr old male with hx of COVID pneumonia, and severe debility with LUE weakness/sensory deficits- here for hospital F/U- L carpal tunnel and ulnar neuropathy based on exam- median neuropathy/higher compression? Since s/p carpal tunnel surgery/decompression.    1. Schedule him for EMG/NCS with office as son as we can.   2. Keppra 500 mg 2x/day x 2 weeks then 1000 mg 2/day- for nerve pain.  Went over side effects- sedation, irritability   3. F/U in 4-6 weeks- did go over Trileptal - decided against it.

## 2019-05-19 ENCOUNTER — Emergency Department (HOSPITAL_COMMUNITY): Payer: Medicare Other

## 2019-05-19 ENCOUNTER — Emergency Department (HOSPITAL_COMMUNITY)
Admission: EM | Admit: 2019-05-19 | Discharge: 2019-05-19 | Disposition: A | Discharge status: Home / Self Care | Payer: Medicare Other | Attending: Emergency Medicine | Admitting: Emergency Medicine

## 2019-05-19 ENCOUNTER — Encounter (HOSPITAL_COMMUNITY): Payer: Self-pay | Admitting: *Deleted

## 2019-05-19 ENCOUNTER — Other Ambulatory Visit: Payer: Self-pay

## 2019-05-19 DIAGNOSIS — M25552 Pain in left hip: Secondary | ICD-10-CM | POA: Diagnosis not present

## 2019-05-19 DIAGNOSIS — M79642 Pain in left hand: Secondary | ICD-10-CM | POA: Insufficient documentation

## 2019-05-19 DIAGNOSIS — Z5321 Procedure and treatment not carried out due to patient leaving prior to being seen by health care provider: Secondary | ICD-10-CM | POA: Insufficient documentation

## 2019-05-19 NOTE — ED Triage Notes (Signed)
Pt reports having a fall today and has pain to left hand and right hip. Has abrasions to left elbow.

## 2019-05-22 ENCOUNTER — Other Ambulatory Visit (HOSPITAL_COMMUNITY)
Admission: RE | Admit: 2019-05-22 | Discharge: 2019-05-22 | Disposition: A | Discharge status: Home / Self Care | Payer: Medicare Other | Source: Ambulatory Visit | Attending: Primary Care | Admitting: Primary Care

## 2019-05-22 DIAGNOSIS — Z01812 Encounter for preprocedural laboratory examination: Secondary | ICD-10-CM | POA: Diagnosis present

## 2019-05-22 DIAGNOSIS — Z20822 Contact with and (suspected) exposure to covid-19: Secondary | ICD-10-CM | POA: Diagnosis not present

## 2019-05-22 LAB — SARS CORONAVIRUS 2 (TAT 6-24 HRS): SARS Coronavirus 2: NEGATIVE

## 2019-05-26 ENCOUNTER — Ambulatory Visit (INDEPENDENT_AMBULATORY_CARE_PROVIDER_SITE_OTHER): Payer: Medicare Other | Admitting: Pulmonary Disease

## 2019-05-26 ENCOUNTER — Ambulatory Visit: Payer: Medicare Other | Admitting: Primary Care

## 2019-05-26 ENCOUNTER — Encounter: Payer: Self-pay | Admitting: Acute Care

## 2019-05-26 ENCOUNTER — Ambulatory Visit: Payer: Medicare Other | Admitting: Acute Care

## 2019-05-26 ENCOUNTER — Other Ambulatory Visit: Payer: Self-pay

## 2019-05-26 VITALS — BP 120/76 | HR 83 | Temp 98.5°F | Ht 70.0 in | Wt 255.0 lb

## 2019-05-26 DIAGNOSIS — R5381 Other malaise: Secondary | ICD-10-CM

## 2019-05-26 DIAGNOSIS — R0681 Apnea, not elsewhere classified: Secondary | ICD-10-CM | POA: Diagnosis not present

## 2019-05-26 DIAGNOSIS — Z8616 Personal history of COVID-19: Secondary | ICD-10-CM

## 2019-05-26 DIAGNOSIS — R0602 Shortness of breath: Secondary | ICD-10-CM | POA: Diagnosis not present

## 2019-05-26 DIAGNOSIS — Z9981 Dependence on supplemental oxygen: Secondary | ICD-10-CM

## 2019-05-26 DIAGNOSIS — W19XXXS Unspecified fall, sequela: Secondary | ICD-10-CM

## 2019-05-26 LAB — PULMONARY FUNCTION TEST
DL/VA % pred: 120 %
DL/VA: 4.87 ml/min/mmHg/L
DLCO cor % pred: 76 %
DLCO cor: 19.87 ml/min/mmHg
DLCO unc % pred: 76 %
DLCO unc: 19.87 ml/min/mmHg
FEF 25-75 Post: 3.15 L/sec
FEF 25-75 Pre: 3.28 L/sec
FEF2575-%Change-Post: -4 %
FEF2575-%Pred-Post: 127 %
FEF2575-%Pred-Pre: 132 %
FEV1-%Change-Post: -2 %
FEV1-%Pred-Post: 75 %
FEV1-%Pred-Pre: 76 %
FEV1-Post: 2.44 L
FEV1-Pre: 2.49 L
FEV1FVC-%Change-Post: -1 %
FEV1FVC-%Pred-Pre: 117 %
FEV6-%Change-Post: 0 %
FEV6-%Pred-Post: 68 %
FEV6-%Pred-Pre: 68 %
FEV6-Post: 2.87 L
FEV6-Pre: 2.86 L
FEV6FVC-%Change-Post: 0 %
FEV6FVC-%Pred-Post: 105 %
FEV6FVC-%Pred-Pre: 105 %
FVC-%Change-Post: 0 %
FVC-%Pred-Post: 64 %
FVC-%Pred-Pre: 65 %
FVC-Post: 2.88 L
FVC-Pre: 2.89 L
Post FEV1/FVC ratio: 85 %
Post FEV6/FVC ratio: 100 %
Pre FEV1/FVC ratio: 86 %
Pre FEV6/FVC Ratio: 100 %
RV % pred: 51 %
RV: 1.27 L
TLC % pred: 74 %
TLC: 5.22 L

## 2019-05-26 MED ORDER — ALBUTEROL SULFATE HFA 108 (90 BASE) MCG/ACT IN AERS: 2.0000 | INHALATION_SPRAY | RESPIRATORY_TRACT | 2 refills | Status: DC | PRN

## 2019-05-26 MED ORDER — MOMETASONE FURO-FORMOTEROL FUM 100-5 MCG/ACT IN AERO: 2.0000 | INHALATION_SPRAY | Freq: Two times a day (BID) | RESPIRATORY_TRACT | 3 refills | Status: DC

## 2019-05-26 NOTE — Patient Instructions (Addendum)
It is good to see you today. We will send in a prescription for your [facility] 2 puffs twice daily, without fail. Rinse mouth after use We will refill your albuterol inhaler today. Use this for breakthrough shortness of breath as needed up to 3 times daily. If you need your rescue inhaler more frequently please call the office to be seen.  We will re-order your CPAP titration study so we can get you set up with a CPAP with oxygen  Please follow up with your PCP for follow up of your hip dislocation. Follow up after CPAP titration with Maralyn Sago NP in 1 month. Follow up with Dr. Isaiah Serge in 4 months for follow up pulmonary fibrosis post Covid. Call us if you need [place] sooner Congratulations on your first Covid 19 vaccine!! Remember to get your second vaccine Please contact office for sooner follow up if symptoms do not improve or worsen or seek emergency care

## 2019-05-26 NOTE — Progress Notes (Signed)
Full PFT performed today. °

## 2019-05-26 NOTE — Progress Notes (Addendum)
History of Present Illness Kurt [name] is a 69 y.o. male never smoker with medical history significant ofchronic diastolic congestive heart failure, hypertension, CKD stage III, recentCOVIDpneumonia (12/6-12/16/ 2020) and recurrent pneumonia requiring admission 12/28-01/23/2019 for hypoxia with CT showing multifocal groundglass opacities with cavitary nodules.  Treated with Augmentin and slow steroid taper Readmitted 1/25-02/23/2019 with recurrent respiratory failure with abnormal CT.  Bronchoscopy with BAL performed on 02/18/2019 with negative cultures, 60% lymphocytes.  Sputum cultures showed Pseudomonas and was treated with cefepime.  He was placed on prednisone taper and discharged  He had some slow improvement over time. When last seen by Dr. Vaughan Browner 03/17/2019 he was still on prednisone 40 mg daily and Bactrim.He remained on supplemental oxygen. 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 Presents for follow up. He states he has been doing better from a pulmonary standpoint. He is off his oxygen. He is off his prednisone taper. Saturations today were  99% on room air. He has been using his Dulera twice daily. He has been out of it since Friday, and states he does feel a slight difference in his breathing. I will re-order.He states he does use his albuterol rescue  about 3-4 times a week.    He has had 3 falls in the last several weeks. He states that he was started on Lyrica for diabetic neuropathies. He states this makes him very sleepy and he feels this was the reason for his falls. He did dislocate his right hip with the last fall. He did have it x rayed, and it is dislocated. He is walking with a walker today. He has not had a formal visit with MD to evaluate his hip as he feels it has slipped back into place.  He has been evaluated for sleep apnea. He does have severe sleep apnea  Per his test. I will order a cpap titration.This was previously  ordered, but has not been scheduled.  [facility], but he has had to stop for now until his hip improves.  Marland Kitchen   He did get his first Covid Vaccine last week. He is scheduled for his second shot in 3 weeks.  Tests  05/26/2019 PFT's     02/18/2019 Bronch - Interstitial lung disease - Anatomy normal - Mucosa with erythma and easily irritated - Bronchoalveolar lavage was performed.  03/17/2019 Imaging/ Tests: CTA 01/19/2019-scattered irregular groundglass opacities, 2 cavitary nodules in the right upper lobe CTA 02/16/2019-seen bilateral groundglass opacities, reduced size of right lung nodules with improvement in cavitation.  03/25/2019 HRCT Spectrum of findings compatible with fibrotic interstitial lung disease without honeycombing and without apicobasilar gradient. Consolidative and ground-glass opacities have substantially improved in the interval, while the fibrotic changes have mildly worsened. 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 Findings are suggestive of an alternative diagnosis (not UIP) per consensus guidelines Two solid pulmonary nodules, largest 1.1 cm in the right middle lobe, both decreased since 02/16/2019 chest CT, probably benign. Suggest follow-up chest CT in 6 months. 3. Aortic Atherosclerosis (ICD10-I70.0).   6 minute walk 04/07/2019  Interpretation   Distance completed  211 meters    Tech Comments:  Patient ambulated at a steady pace, with walker.  Patient sats were 87% on RA at the end of 6 min walk, sats immediately started climbing into the 90's. Patient stated he  has O2, that he uses prn, & night       04/09/2019 Sleep Study IMPRESSIONS - Severe obstructive sleep apnea with an AHI of 41.4 and SpO2 low of 70%. - Two liters supplemental oxygen applied due to persistent hypoxemia. - He did not meet split night study criteria and was not tried  on CPAP during this study.  DIAGNOSIS - Obstructive Sleep Apnea (327.23 [G47.33 ICD-10]) - Nocturnal Hypoxemia (327.26 [G47.36 ICD-10])   05/19/2019 DG Hip IMPRESSION:  No acute fracture or dislocation identified about the right hip or  pelvis.  CBC Latest Ref Rng & Units 03/11/2019 03/08/2019 03/03/2019  WBC 4.0 - 10.5 K/uL 12.9(H) 13.7(H) 13.5(H)  Hemoglobin 13.0 - 17.0 g/dL 13.7 12.8(L) 12.1(L)  Hematocrit 39.0 - 52.0 % 42.6 41.2 38.2(L)  Platelets 150 - 400 K/uL 207 259 333    BMP Latest Ref Rng & Units 03/11/2019 03/08/2019 03/03/2019  Glucose 70 - 99 mg/dL 110(H) 94 98  BUN 8 - 23 mg/dL 30(H) 30(H) 32(H)  Creatinine 0.61 - 1.24 mg/dL 1.38(H) 1.34(H) 1.34(H)  Sodium 135 - 145 mmol/L 136 138 137  Potassium 3.5 - 5.1 mmol/L 3.9 3.9 4.3  Chloride 98 - 111 mmol/L 96(L) 100 98  CO2 22 - 32 mmol/L _0 Calcium 8.9 - 10.3 mg/dL 9.2 8.9 9.0    BNP    Component Value Date/Time   BNP 47.6 02/15/2019 2240    ProBNP No results found for: PROBNP  PFT    Component Value Date/Time   FEV1PRE 2.49 05/26/2019 1359   FEV1POST 2.44 05/26/2019 1359   FVCPRE 2.89 05/26/2019 1359   FVCPOST 2.88 05/26/2019 1359   TLC 5.22 05/26/2019 1359   DLCOUNC 19.87 05/26/2019 1359   PREFEV1FVCRT 86 05/26/2019 1359   PSTFEV1FVCRT 85 05/26/2019 1359    DG Hand Complete Left  Result Date: 05/19/2019 CLINICAL DATA:  [place] this morning injuring LEFT hand EXAM: LEFT HAND - COMPLETE 3+ VIEW COMPARISON:  None FINDINGS: Osseous mineralization normal. Joint space narrowing and minimal spur formation at the second and third MCP joints. Scattered degenerative changes at DIP joints. Remaining joint spaces preserved. No acute fracture, dislocation, or bone destruction. IMPRESSION: Scattered degenerative changes as above. No acute osseous abnormalities. Electronically Signed   By: Lavonia Dana M.D.   On: 05/19/2019 14:40   DG Hip Unilat  With Pelvis 2-3 Views Right  Result Date: 05/19/2019 CLINICAL DATA:   69 year old male status post fall this morning with pain. EXAM: DG HIP (WITH OR WITHOUT PELVIS) 2-3V RIGHT COMPARISON:  CT Abdomen and Pelvis 09/09/2017. FINDINGS: Femoral heads are normally located. Hip joint spaces appear symmetric. Bone mineralization is within normal limits. Pelvis appears intact. Sacral ala and SI joints appear normal. Grossly intact proximal left femur. Stable chronic dystrophic calcification about the left greater trochanter. The proximal right femur appears intact. Negative lower abdominal and pelvic visceral contours. IMPRESSION: No acute fracture or dislocation identified about the right hip or pelvis. Electronically Signed   By: Genevie Ann M.D.   On: 05/19/2019 14:41     Past medical hx Past Medical History:  Diagnosis Date  . Arthritis   . CHF (congestive heart failure) (HCC)    diastolic dysfunction  . Chronic back pain   . COVID-19   . Depression   . Diverticulitis   . Duodenal ulcer 05/2018   Treated with 3 clips/Dr. Henrene Pastor  . Fall off scafolding 2003   prolonged hospitalization- multiple BUE surgery, concussion  . GI bleed 05/2018  .  GIB (gastrointestinal bleeding) 2016   Antral ulcer/duodenal bleed per Endoscopy by Dr. Benson Norway  . Hypertension   . PTSD (post-traumatic stress disorder)    after  a Fall   . Renal insufficiency    Decrease in GFR- PCP removed all NSAIDS & aspirin      Social History   Tobacco Use  . Smoking status: Never Smoker  . Smokeless tobacco: Never Used  Substance Use Topics  . Alcohol use: Not Currently    Comment: quit in 1989   . Drug use: No    Mr.Honda reports that he has never smoked. He has never used smokeless tobacco. He reports previous alcohol use. He reports that he does not use drugs.  Tobacco Cessation: Never Smoker  Past surgical hx, Family hx, Social hx all reviewed.  Current Outpatient Medications on File Prior to Visit  Medication Sig  . acetaminophen (TYLENOL) 500 MG tablet Take 1,000 mg by mouth every 4  (four) hours as needed for headache (pain).  Marland Kitchen albuterol (VENTOLIN HFA) 108 (90 Base) MCG/ACT inhaler Inhale 2 puffs into the lungs every 4 (four) hours as needed for wheezing or shortness of breath.  . allopurinol (ZYLOPRIM) 100 MG tablet Take 100 mg by mouth 2 (two) times a day.  Marland Kitchen amLODipine (NORVASC) 2.5 MG tablet Take 2.5 mg by mouth daily.  . carvedilol (COREG) 3.125 MG tablet Take 1 tablet (3.125 mg total) by mouth 2 (two) times daily with a meal.  . ferrous sulfate 324 MG TBEC Take 324 mg by mouth daily with breakfast.  . fluticasone (FLONASE) 50 MCG/ACT nasal spray Place 2 sprays into both nostrils daily.  . furosemide (LASIX) 40 MG tablet Take 1 tablet (40 mg total) by mouth daily.  Marland Kitchen levETIRAcetam (KEPPRA) 500 MG tablet Take 1 tablet (500 mg total) by mouth 2 (two) times daily. X 2 week then 1000 mg 2x/day- for nerve pain  . Melatonin 3 MG TABS Take 2 tablets (6 mg total) by mouth at bedtime.  . mometasone-formoterol (DULERA) 100-5 MCG/ACT AERO Inhale 2 puffs into the lungs 2 (two) times daily.  . montelukast (SINGULAIR) 10 MG tablet Take 1 tablet (10 mg total) by mouth at bedtime.  . pantoprazole (PROTONIX) 40 MG tablet Take 1 tablet (40 mg total) by mouth 2 (two) times daily.  . polyethylene glycol (MIRALAX / GLYCOLAX) 17 g packet Take 17 g by mouth daily as needed for mild constipation.  . pregabalin (LYRICA) 75 MG capsule Take 1 capsule (75 mg total) by mouth 2 (two) times daily.  . sertraline (ZOLOFT) 100 MG tablet Take 2 tablets (200 mg total) by mouth every morning.   No current facility-administered medications on file prior to visit.     Allergies  Allergen Reactions  . Lyrica [Pregabalin] Other (See Comments)    Falls and balance problems    Review Of Systems:  Constitutional:   No  weight loss, night sweats,  Fevers, chills,+ occasional fatigue or  lassitude.  HEENT:   No headaches,  Difficulty swallowing,  Tooth/dental problems, or  Sore throat,                No  sneezing, itching, ear ache, nasal congestion, post nasal drip,   CV:  No chest pain,  Orthopnea, PND, swelling in lower extremities, anasarca, dizziness, palpitations, syncope.   GI  No heartburn, indigestion, abdominal pain, nausea, vomiting, diarrhea, change in bowel habits, loss of appetite, bloody stools.   Resp: No shortness of breath with exertion  or at rest.  No excess mucus, no productive cough,  No non-productive cough,  No coughing up of blood.  No change in color of mucus.  No wheezing.  No chest wall deformity  Skin: no rash or lesions.  GU: no dysuria, change in color of urine, no urgency or frequency.  No flank pain, no hematuria   MS:  + joint pain or swelling.  +  decreased range of motion ( hip).  No back pain.  Psych:  No change in mood or affect. No depression or anxiety.  No memory loss.   Vital Signs BP 120/76 (BP Location: Left Arm, Cuff Size: Normal)   Pulse 83   Temp 98.5 F (36.9 C) (Temporal)   Ht _0  (1.778 m)   Wt 255 lb (115.7 kg)   SpO2 99%   BMI 36.59 kg/m    Physical Exam:  General- No distress,  A&Ox3, pleasant ENT: No sinus tenderness, TM clear, pale nasal mucosa, no oral exudate,no post nasal drip, no LAN Cardiac: S1, S2, regular rate and rhythm, no murmur Chest: No wheeze/ rales/ dullness; no accessory muscle use, no nasal flaring, no sternal retractions, diminished per bases, few crackles per bases bilaterally Abd.: Soft Non-tender, ND, BS +, Body mass index is 36.59 kg/m. Ext: No clubbing cyanosis, edema Neuro: Deconditioned, using walker, MAE x 3, A&O x 4 Skin: No rashes, No lesions, warm and dry Psych: normal mood and behavior   Assessment/Plan Post COVID-19 pneumonia, Pseudomonas HAP Bilateral pulmonary infiltrates, cavitary lung lesions Slow to resolve >> Total for 3 hospitalizations Last CT scan shows improvement in cavitary lesions Now off  Home O2 and prednisone, sats are 99% on RA after walking to exam room. PFT  results discussed Plan Will need continued high-res CT, PFTs and 6-minute walk test at intervals as follow up post Covid fibrosis Continue Dulera 2 puffs twice daily>> every day without fail   Rinse mouth after use Continue rescue albuterol as needed Continue to exercise once you have had your hip cleared to do so. Let us know if you want to go to Pulmonary Rehab for structured rehab Follow up in 4 months with Dr. Vaughan Browner to determine next interval for HRCT, PFTs and 6-minute walk test as follow up post Covid fibrosis   Severe OSA AHI of 41.4 and SpO2 low of 70%. Plan Schedule CPAP titration asap Will need Oxygen bled into system. Will then need 30-90 day follow up once therapy is initiated  Dislocated hip after fall Physical deconditioning Plan Pt. Stopped Lyrica on his own Let us know if you would like to go to Pulmonary Rehab once your hip is better Please check with PCP or ortho re hip dislocation and recommendations for increasing activity   This appointment was 40 min long with over 50% of the time in direct face-to-face patient care, assessment, plan of care, and follow-up.   Magdalen Spatz, NP 05/26/2019  3:24 PM

## 2019-05-27 ENCOUNTER — Encounter: Payer: Self-pay | Admitting: Acute Care

## 2019-05-31 ENCOUNTER — Other Ambulatory Visit (HOSPITAL_COMMUNITY)
Admission: RE | Admit: 2019-05-31 | Discharge: 2019-05-31 | Disposition: A | Discharge status: Home / Self Care | Payer: Medicare Other | Source: Ambulatory Visit | Attending: Pulmonary Disease | Admitting: Pulmonary Disease

## 2019-05-31 DIAGNOSIS — Z20822 Contact with and (suspected) exposure to covid-19: Secondary | ICD-10-CM | POA: Diagnosis not present

## 2019-05-31 DIAGNOSIS — Z01812 Encounter for preprocedural laboratory examination: Secondary | ICD-10-CM | POA: Diagnosis present

## 2019-05-31 LAB — SARS CORONAVIRUS 2 (TAT 6-24 HRS): SARS Coronavirus 2: NEGATIVE

## 2019-06-07 ENCOUNTER — Encounter: Payer: Medicare Other | Attending: Physical Medicine and Rehabilitation | Admitting: Physical Medicine & Rehabilitation

## 2019-06-07 ENCOUNTER — Other Ambulatory Visit: Payer: Self-pay

## 2019-06-07 ENCOUNTER — Encounter: Payer: Self-pay | Admitting: Physical Medicine & Rehabilitation

## 2019-06-07 ENCOUNTER — Encounter: Payer: Self-pay | Admitting: Pediatrics

## 2019-06-07 ENCOUNTER — Other Ambulatory Visit (HOSPITAL_COMMUNITY)
Admission: RE | Admit: 2019-06-07 | Discharge: 2019-06-07 | Disposition: A | Discharge status: Home / Self Care | Payer: Medicare Other | Source: Ambulatory Visit | Attending: Acute Care | Admitting: Acute Care

## 2019-06-07 VITALS — BP 131/82 | HR 81 | Resp 14 | Ht 70.0 in | Wt 254.0 lb

## 2019-06-07 DIAGNOSIS — Z20822 Contact with and (suspected) exposure to covid-19: Secondary | ICD-10-CM | POA: Diagnosis not present

## 2019-06-07 DIAGNOSIS — R202 Paresthesia of skin: Secondary | ICD-10-CM

## 2019-06-07 DIAGNOSIS — J984 Other disorders of lung: Secondary | ICD-10-CM | POA: Insufficient documentation

## 2019-06-07 DIAGNOSIS — Z01812 Encounter for preprocedural laboratory examination: Secondary | ICD-10-CM | POA: Insufficient documentation

## 2019-06-07 DIAGNOSIS — R200 Anesthesia of skin: Secondary | ICD-10-CM

## 2019-06-07 DIAGNOSIS — Z9981 Dependence on supplemental oxygen: Secondary | ICD-10-CM | POA: Insufficient documentation

## 2019-06-07 DIAGNOSIS — J189 Pneumonia, unspecified organism: Secondary | ICD-10-CM | POA: Diagnosis present

## 2019-06-07 DIAGNOSIS — G5612 Other lesions of median nerve, left upper limb: Secondary | ICD-10-CM | POA: Diagnosis present

## 2019-06-07 DIAGNOSIS — R5381 Other malaise: Secondary | ICD-10-CM | POA: Diagnosis not present

## 2019-06-07 DIAGNOSIS — R208 Other disturbances of skin sensation: Secondary | ICD-10-CM

## 2019-06-07 DIAGNOSIS — R269 Unspecified abnormalities of gait and mobility: Secondary | ICD-10-CM | POA: Insufficient documentation

## 2019-06-07 LAB — SARS CORONAVIRUS 2 (TAT 6-24 HRS): SARS Coronavirus 2: NEGATIVE

## 2019-06-07 NOTE — Progress Notes (Addendum)
See media tab for full results  Addendum: Received NCS/EMG from [facility] neurosurgery and spine.  Reviewed.  Suggestion of bilateral carpal tunnel syndrome with?  Superimposed neuropathy.  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

## 2019-06-09 ENCOUNTER — Ambulatory Visit (HOSPITAL_BASED_OUTPATIENT_CLINIC_OR_DEPARTMENT_OTHER): Payer: Medicare Other | Attending: Acute Care | Admitting: Pulmonary Disease

## 2019-06-09 ENCOUNTER — Other Ambulatory Visit: Payer: Self-pay

## 2019-06-09 VITALS — Temp 98.4°F | Ht 70.0 in | Wt 246.0 lb

## 2019-06-09 DIAGNOSIS — G4739 Other sleep apnea: Secondary | ICD-10-CM

## 2019-06-09 DIAGNOSIS — R0681 Apnea, not elsewhere classified: Secondary | ICD-10-CM

## 2019-06-09 DIAGNOSIS — G4733 Obstructive sleep apnea (adult) (pediatric): Secondary | ICD-10-CM

## 2019-06-10 ENCOUNTER — Other Ambulatory Visit (HOSPITAL_BASED_OUTPATIENT_CLINIC_OR_DEPARTMENT_OTHER): Payer: Self-pay

## 2019-06-10 DIAGNOSIS — R0681 Apnea, not elsewhere classified: Secondary | ICD-10-CM

## 2019-06-11 NOTE — Procedures (Signed)
    Patient Name: Kurt [name], Gallon Date: 06/09/2019 Gender: Male D.O.B: July 27, 1950 Age (years): 31 Referring Provider: Bevelyn Ngo NP Height (inches): 70 Interpreting Physician: Coralyn Helling MD, ABSM Weight (lbs): 246 RPSGT: Ulyess Mort BMI: 35 MRN: [id number] Neck Size: 21.00  CLINICAL INFORMATION The patient is referred for a CPAP titration to treat sleep apnea.  Date of NPSG 04/01/19: AHI 41.4, SpO2 70%, required 2 liters supplemental oxygen during the study.  SLEEP STUDY TECHNIQUE As per the AASM Manual for the Scoring of Sleep and Associated Events v2.3 (April 2016) with a hypopnea requiring 4% desaturations.  The channels recorded and monitored were frontal, central and occipital EEG, electrooculogram (EOG), submentalis EMG (chin), nasal and oral airflow, thoracic and abdominal wall motion, anterior tibialis EMG, snore microphone, electrocardiogram, and pulse oximetry. Bilevel positive airway pressure (BPAP) was initiated at the beginning of the study and titrated to treat sleep-disordered breathing.  MEDICATIONS Medications self-administered by patient taken the night of the study : MELATONIN  RESPIRATORY PARAMETERS Optimal IPAP Pressure (cm): 11 AHI at Optimal Pressure (/hr) 0.0 Overall Minimal O2 (%): 81.0 Minimal O2 at Optimal Pressure (%): 83.0  SLEEP ARCHITECTURE Start Time: 10:47:12 PM Stop Time: 4:50:30 AM Total Time (min): 363.3 Total Sleep Time (min): 251 Sleep Latency (min): 32.5 Sleep Efficiency (%): 69.1% REM Latency (min): 120.0 WASO (min): 79.8 Stage N1 (%): 27.7% Stage N2 (%): 72.1% Stage N3 (%): 0.0% Stage R (%): 0.2 Supine (%): 100.00 Arousal Index (/hr): 37.1   CARDIAC DATA The 2 lead EKG demonstrated sinus rhythm. The mean heart rate was 58.3 beats per minute. Other EKG findings include: None.  LEG MOVEMENT DATA The total Periodic Limb Movements of Sleep (PLMS) were 0. The PLMS index was 0.0. A PLMS index of <15 is considered normal in  adults.  IMPRESSIONS - He did best with CPAP at 11 cm H2O. - At higher pressure settings with CPAP and Bipap he had central apnea and sleep disruption. - He was not observed in REM sleep during this study. - He did not require the use of supplemental oxygen during this study.  DIAGNOSIS - Obstructive Sleep Apnea  - Treatment Emergent Central Sleep Apnea  RECOMMENDATIONS - Trial of CPAP therapy on 11 cm H2O with a Medium size Resmed Full Face Mask Mirage Quattro mask and heated humidification.  [Electronically signed] 06/11/2019 02:08 PM  Coralyn Helling MD, ABSM Diplomate, American Board of Sleep Medicine   NPI: [id number]

## 2019-06-15 ENCOUNTER — Other Ambulatory Visit: Payer: Self-pay

## 2019-06-15 DIAGNOSIS — G4733 Obstructive sleep apnea (adult) (pediatric): Secondary | ICD-10-CM

## 2019-06-18 ENCOUNTER — Other Ambulatory Visit: Payer: Self-pay

## 2019-06-18 ENCOUNTER — Encounter: Payer: Self-pay | Admitting: Physical Medicine and Rehabilitation

## 2019-06-18 ENCOUNTER — Encounter: Payer: Medicare Other | Admitting: Physical Medicine and Rehabilitation

## 2019-06-18 VITALS — BP 149/85 | HR 65 | Temp 97.7°F | Ht 70.0 in | Wt 250.0 lb

## 2019-06-18 DIAGNOSIS — G5620 Lesion of ulnar nerve, unspecified upper limb: Secondary | ICD-10-CM | POA: Diagnosis not present

## 2019-06-18 DIAGNOSIS — R5381 Other malaise: Secondary | ICD-10-CM | POA: Diagnosis not present

## 2019-06-18 DIAGNOSIS — G5603 Carpal tunnel syndrome, bilateral upper limbs: Secondary | ICD-10-CM | POA: Insufficient documentation

## 2019-06-18 DIAGNOSIS — R296 Repeated falls: Secondary | ICD-10-CM | POA: Insufficient documentation

## 2019-06-18 MED ORDER — LEVETIRACETAM 1000 MG PO TABS: 1000.0000 mg | ORAL_TABLET | Freq: Two times a day (BID) | ORAL | 3 refills | Status: DC

## 2019-06-18 NOTE — Patient Instructions (Signed)
Patient is a 69 yr old male with hx of COVID pneumonia, and severe debility with LUE weakness/sensory deficits- here for hospital F/U- L carpal tunnel and ulnar neuropathy based on exam- median neuropathy/higher compression? 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 Suggest Episport elbow brace- can wear B/L- wearing to prevent pressure.   2. Vit B complex- over the counter- can help irritability with Keppra..   3. con't Keppra- send in 90 day supply for 1000 mg 2x/day-    4. F/U as needed- con't cane

## 2019-06-18 NOTE — Progress Notes (Signed)
Subjective:    Patient ID: Kurt [name], male    DOB: Jul 20, 1950, 69 y.o.   MRN: [id number]  HPI  New medicine for neuropathy Has helped some- but also noted some irritability with it. Certain things set off/agitate him faster than before.   Feet don't go numb like they used to- and can stand longer with Keppra.  Sleeping better.   Has been falling- even since off last medicine that we thought was cause. Could move foot- but not R hip- couldn't walk.   Thought had broken R hip- went back to hospital- got xrays-  Left ER since found out wasn't broken- occurred 4/28.   Able to walk now with cane/crutch-  Scared not to use it Was just walking across parking lot- like R hip "gave it"- went to take a step and down he went.   Had ridden motorcycle the day before.   Had COVID test Monday- has gotten both vaccines- arm didn't even get sore.     Pulled hamstring but no fracture- has gotten better.  Could put some weight on it eventually Had sleep study- go back 6/7 for sleep study.     Pain Inventory Average Pain 4 Pain Right Now 4 My pain is burning, tingling and aching  In the last 24 hours, has pain interfered with the following? General activity 4 Relation with others 3 Enjoyment of life 3 What TIME of day is your pain at its worst? evening, night Sleep (in general) Fair  Pain is worse with: some activites Pain improves with: medication Relief from Meds: 4  Mobility walk with assistance use a cane ability to climb steps?  yes do you drive?  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  yes nerve study  Physicians involved in your care Any changes since last visit?  no   History reviewed. No pertinent family history. Social History   Socioeconomic History  . Marital status: Widowed    Spouse name: Not on file  .  Number of children: Not on file  . Years of education: Not on file  . Highest education level: Not on file  Occupational History  . Not on file  Tobacco Use  . Smoking status: Never Smoker  . Smokeless tobacco: Never Used  Substance and Sexual Activity  . Alcohol use: Not Currently    Comment: quit in 1989   . Drug use: No  . Sexual activity: Not Currently  Other Topics Concern  . Not on file  Social History Narrative  . Not on file   Social Determinants of Health   Financial Resource Strain:   . Difficulty of Paying Living Expenses:   Food Insecurity: Unknown  . Worried About Charity fundraiser in the Last Year: Patient refused  . Ran Out of Food in the Last Year: Patient refused  Transportation Needs: Unknown  . Lack of Transportation (Medical): Patient refused  . Lack of Transportation (Non-Medical): Patient refused  Physical Activity: Unknown  . Days of Exercise per Week: 3 days  . Minutes of Exercise per Session: Not on file  Stress: No Stress Concern Present  . Feeling of Stress : Only a little  Social Connections: Unknown  . Frequency of Communication with Friends and Family: Patient refused  . Frequency of Social Gatherings with Friends and Family: Patient refused  . Attends Religious Services:  Patient refused  . Active Member of Clubs or Organizations: Patient refused  . Attends Banker Meetings: Patient refused  . Marital Status: Patient refused   Past Surgical History:  Procedure Laterality Date  . BIOPSY  06/16/2018   Procedure: BIOPSY;  Surgeon: Hilarie Fredrickson, MD;  Location: [facility] ENDOSCOPY;  Service: Endoscopy;;  . ELBOW FRACTURE SURGERY     ORIF, Shoulder manipulation    . ESOPHAGOGASTRODUODENOSCOPY N/A 04/30/2014   Procedure: ESOPHAGOGASTRODUODENOSCOPY (EGD);  Surgeon: Jeani Hawking, MD;  Location: [facility] ENDOSCOPY;  Service: Endoscopy;  Laterality: N/A;  . ESOPHAGOGASTRODUODENOSCOPY (EGD) WITH PROPOFOL N/A 06/16/2018   Procedure:  ESOPHAGOGASTRODUODENOSCOPY (EGD) WITH PROPOFOL;  Surgeon: Hilarie Fredrickson, MD;  Location: [facility] ENDOSCOPY;  Service: Endoscopy;  Laterality: N/A;  . HEMOSTASIS CLIP PLACEMENT  06/16/2018   Procedure: HEMOSTASIS CLIP PLACEMENT;  Surgeon: Hilarie Fredrickson, MD;  Location: [facility] ENDOSCOPY;  Service: Endoscopy;;  . LUMBAR LAMINECTOMY/DECOMPRESSION MICRODISCECTOMY N/A 02/16/2015   Procedure: Thoracic ten - thoracic twelve laminectomy;  Surgeon: Julio Sicks, MD;  Location: [facility] NEURO ORS;  Service: Neurosurgery;  Laterality: N/A;  . LUMBAR LAMINECTOMY/DECOMPRESSION MICRODISCECTOMY N/A 03/22/2016   Procedure: Lumbar One-Two, Lumbar Two-Three, Lumbar Three-Four Laminectomy and Foraminotomy;  Surgeon: Julio Sicks, MD;  Location: [facility] OR;  Service: Neurosurgery;  Laterality: N/A;  . NASAL SEPTUM SURGERY    . SCLEROTHERAPY  06/16/2018   Procedure: SCLEROTHERAPY;  Surgeon: Hilarie Fredrickson, MD;  Location[facility] ENDOSCOPY;  Service: Endoscopy;;  . VIDEO BRONCHOSCOPY Bilateral 02/18/2019   Procedure: VIDEO BRONCHOSCOPY WITHOUT FLUORO;  Surgeon: Luciano Cutter, MD;  Location: [facility] ENDOSCOPY;  Service: Pulmonary;  Laterality: Bilateral;   Past Medical History:  Diagnosis Date  . Arthritis   . CHF (congestive heart failure) (HCC)    diastolic dysfunction  . Chronic back pain   . COVID-19   . Depression   . Diverticulitis   . Duodenal ulcer 05/2018   Treated with 3 clips/Dr. Marina Goodell  . Fall off scafolding 2003   prolonged hospitalization- multiple BUE surgery, concussion  . GI bleed 05/2018  . GIB (gastrointestinal bleeding) 2016   Antral ulcer/duodenal bleed per Endoscopy by Dr. Elnoria Howard  . Hypertension   . PTSD (post-traumatic stress disorder)    after  a Fall   . Renal insufficiency    Decrease in GFR- PCP removed all NSAIDS & aspirin    BP (!) 149/85   Pulse 65   Temp 97.7 F (36.5 C)   Ht 5\' 10"  (1.778 m)   Wt 250 lb (113.4 kg)   SpO2 96%   BMI 35.87 kg/m   Opioid Risk Score:   Fall Risk Score:  `1  Depression  screen PHQ 2/9  Depression screen PHQ 2/9 03/22/2019  Decreased Interest 0  Down, Depressed, Hopeless 0  PHQ - 2 Score 0  Altered sleeping 0  Tired, decreased energy 1  Change in appetite 0  Feeling bad or failure about yourself  0  Trouble concentrating 0  Moving slowly or fidgety/restless 0  Suicidal thoughts 0  PHQ-9 Score 1  Difficult doing work/chores Not difficult at all    Review of Systems  Constitutional: Negative.   HENT: Negative.   Eyes: Negative.   Respiratory: Positive for apnea.   Cardiovascular: Negative.   Gastrointestinal: Negative.   Endocrine: Negative.   Musculoskeletal: Positive for gait problem.  Allergic/Immunologic: Negative.   Neurological: Positive for weakness and numbness.       Tingling   Hematological: Negative.   Psychiatric/Behavioral: Negative.  All other systems reviewed and are negative.      Objective:   Physical Exam  Using strong arm cane to walk- can support hand as well wrist, NAD TTP over R posterior ischium/piriformis, not in R groin No change in neuropathy        Assessment & Plan:   Patient is a 69 yr old male with hx of COVID pneumonia, and severe debility with LUE weakness/sensory deficits- here for hospital F/U- L carpal tunnel and ulnar neuropathy based on exam- median neuropathy/higher compression? 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 Suggest Episport elbow brace- can wear B/L- wearing to prevent pressure.   2. Vit B complex- over the counter- can help irritability with Keppra..   3. con't Keppra- send in 90 day supply for 1000 mg 2x/day-    4. F/U as needed- con't cane-

## 2019-06-28 ENCOUNTER — Ambulatory Visit: Payer: Medicare Other | Admitting: Acute Care

## 2019-06-28 ENCOUNTER — Encounter: Payer: Self-pay | Admitting: Acute Care

## 2019-06-28 ENCOUNTER — Other Ambulatory Visit: Payer: Self-pay

## 2019-06-28 VITALS — BP 122/72 | HR 64 | Temp 98.2°F | Ht 70.0 in | Wt 248.4 lb

## 2019-06-28 DIAGNOSIS — G4733 Obstructive sleep apnea (adult) (pediatric): Secondary | ICD-10-CM | POA: Diagnosis not present

## 2019-06-28 DIAGNOSIS — R5381 Other malaise: Secondary | ICD-10-CM | POA: Diagnosis not present

## 2019-06-28 DIAGNOSIS — Z9989 Dependence on other enabling machines and devices: Secondary | ICD-10-CM

## 2019-06-28 DIAGNOSIS — Z8616 Personal history of COVID-19: Secondary | ICD-10-CM

## 2019-06-28 NOTE — Patient Instructions (Addendum)
It is good to see you today. Continue Dulera and Albuterol as you have been doing.  Rinse mouth after use.  Continue working out in the pool. For your CPAP Machine Continue on CPAP at bedtime. You appear to be benefiting from the treatment  Goal is to wear for at least 6 hours each night for maximal clinical benefit. Continue to work on weight loss, as the link between excess weight  and sleep apnea is well established.   Remember to establish a good bedtime routine, and work on sleep hygiene.  Limit daytime naps , avoid stimulants such as caffeine and nicotine close to bedtime, exercise daily to promote sleep quality, avoid heavy , spicy, fried , or rich foods before bed. Ensure adequate exposure to natural light during the day,establish a relaxing bedtime routine with a pleasant sleep environment ( Bedroom between 60 and 67 degrees, turn off bright lights , TV or device screens screens , consider black out curtains or white noise machines) Do not drive if sleepy. Remember to clean mask, tubing, filter, and reservoir once weekly with soapy water.  Follow up HRCT 09/2019 ( Follow up with [facility] after 09/2019 CT Chest) Follow up with Maralyn Sago NP, or Dr. Craige Cotta   In 6 weeks  or before as needed.

## 2019-06-28 NOTE — Progress Notes (Signed)
History of Present Illness Kurt [name] is a 69 y.o. male never smoker with medical history significant ofchronic diastolic congestive heart failure, hypertension, CKD stage III, recentCOVIDpneumonia (12/6-12/16/ 2020)  recurrent pneumonia with hypoxia and post Covid fibrotic changes. 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 He is also followed for OSA on CPAP by Dr. Halford Chessman.   Kurt [name] is a 69 y.o. male never smoker with medical history significant ofchronic diastolic congestive heart failure, hypertension, CKD stage III, recentCOVIDpneumonia (12/6-12/16/ 2020) and recurrent pneumonia requiring admission 12/28-1/2/2021for hypoxia with CT showing multifocal groundglass opacities with cavitary nodules. Treated with Augmentin and slow steroid taper Readmitted 1/25-02/23/2019 with recurrent respiratory failure with abnormal CT.Bronchoscopy with BAL performed on 02/18/2019 with negative cultures, 60% lymphocytes. Sputum cultures showed Pseudomonas and was treated with cefepime.He was placed on prednisone taper and discharged. He had some slow improvement over time. When last seen by Dr. Vaughan Browner 03/17/2019 he was still on prednisone 40 mg daily and Bactrim.He remained on supplemental oxygen. He is being followed by Dr. Vaughan Browner for post Covid Fibrosis management   He has had a sleep study which was positive for OSA. CPAP titration study shows his optimal settings are 11 cm H2O.Medium Res Med Full Face Mask Mirage Quattro  with heated humidification.    06/28/2019  Follow up for OSA on CPAP/ Post Covid Pneumonia Fibrosis Pt. Presents for follow up. He states he received his CPAP machine Saturday. He used it for the first time last night. He states he slept well. His machine is set at 11 cm pressure. He states that the mask fit well. And he was not aware of a leak.He was comfortable with the ramp up. He has not worn the device for long enough to feel a significant difference.     He has gone back to the pool. This is his primary exercise. He has lost about 60 pounds since he had Covid. He has a nutritionist who is helping with weight loss.  He states he has not been having to wear his oxygen. He is preparing to have it picked up by his DME. He is very thankful he is feeling better. He has had both of his Covid Vaccines. We discussed that he will wear the CPAP every night and that we will check a down Load in 6 weeks once we have enough data to evaluate compliance and effectiveness of therapy...  He is compliant with his Dulera 2 puffs once daily ( he has self prescribed reducing  Dose  down to once daily ) He has an albuterol inhaler that he uses as rescue. He may use this 3 times in a week. He is compliant with his Singulair. He denises any chest pain, orthopnea or hemoptysis.    Test Results:  02/18/2019 Bronch - Interstitial lung disease - Anatomy normal - Mucosa with erythma and easily irritated - Bronchoalveolar lavage was performed.  03/17/2019 Imaging/ Tests: CTA 01/19/2019-scattered irregular groundglass opacities, 2 cavitary nodules in the right upper lobe CTA 02/16/2019-seen bilateral groundglass opacities, reduced size of right lung nodules with improvement in cavitation.  03/25/2019 HRCT Spectrum of findings compatible with fibrotic interstitial lung disease without honeycombing and without apicobasilar gradient. Consolidative and ground-glass opacities have substantially improved in the interval, while the fibrotic changes have mildly worsened. 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 Findings are suggestive of an alternative diagnosis (not UIP) per consensus guidelines Two solid pulmonary nodules, largest 1.1 cm  in the right middle lobe, both decreased since 02/16/2019 chest CT, probably benign. Suggest follow-up chest CT in 6 months. 3. Aortic Atherosclerosis (ICD10-I70.0).   6 minute walk  04/07/2019      Interpretation   Distance completed 211 meters    Tech Comments: Patient ambulated at a steady pace, with walker. Patient sats were 87% on RA at the end of 6 min walk, sats immediately started climbing into the 90's. Patient stated he has O2, that he uses prn, &night       04/09/2019 Sleep Study IMPRESSIONS - Severe obstructive sleep apnea with an AHI of 41.4 and SpO2 low of 70%. - Two liters supplemental oxygen applied due to persistent hypoxemia. - He did not meet split night study criteria and was not tried on CPAP during this study.  DIAGNOSIS - Obstructive Sleep Apnea (327.23 [G47.33 ICD-10]) - Nocturnal Hypoxemia (327.26 [G47.36 ICD-10])   CBC Latest Ref Rng & Units 03/11/2019 03/08/2019 03/03/2019  WBC 4.0 - 10.5 K/uL 12.9(H) 13.7(H) 13.5(H)  Hemoglobin 13.0 - 17.0 g/dL 13.7 12.8(L) 12.1(L)  Hematocrit 39.0 - 52.0 % 42.6 41.2 38.2(L)  Platelets 150 - 400 K/uL 207 259 333    BMP Latest Ref Rng & Units 03/11/2019 03/08/2019 03/03/2019  Glucose 70 - 99 mg/dL 110(H) 94 98  BUN 8 - 23 mg/dL 30(H) 30(H) 32(H)  Creatinine 0.61 - 1.24 mg/dL 1.38(H) 1.34(H) 1.34(H)  Sodium 135 - 145 mmol/L 136 138 137  Potassium 3.5 - 5.1 mmol/L 3.9 3.9 4.3  Chloride 98 - 111 mmol/L 96(L) 100 98  CO2 22 - 32 mmol/L _0 Calcium 8.9 - 10.3 mg/dL 9.2 8.9 9.0    BNP    Component Value Date/Time   BNP 47.6 02/15/2019 2240    ProBNP No results found for: PROBNP  PFT    Component Value Date/Time   FEV1PRE 2.49 05/26/2019 1359   FEV1POST 2.44 05/26/2019 1359   FVCPRE 2.89 05/26/2019 1359   FVCPOST 2.88 05/26/2019 1359   TLC 5.22 05/26/2019 1359   DLCOUNC 19.87 05/26/2019 1359   PREFEV1FVCRT 86 05/26/2019 1359   PSTFEV1FVCRT 85 05/26/2019 1359    SLEEP STUDY DOCUMENTS  Result Date: 06/14/2019 Ordered by an unspecified provider.    Past medical hx Past Medical History:  Diagnosis Date  . Arthritis   . CHF (congestive heart failure) (HCC)    diastolic  dysfunction  . Chronic back pain   . COVID-19   . Depression   . Diverticulitis   . Duodenal ulcer 05/2018   Treated with 3 clips/Dr. Henrene Pastor  . Fall off scafolding 2003   prolonged hospitalization- multiple BUE surgery, concussion  . GI bleed 05/2018  . GIB (gastrointestinal bleeding) 2016   Antral ulcer/duodenal bleed per Endoscopy by Dr. Benson Norway  . Hypertension   . PTSD (post-traumatic stress disorder)    after  a Fall   . Renal insufficiency    Decrease in GFR- PCP removed all NSAIDS & aspirin      Social History   Tobacco Use  . Smoking status: Never Smoker  . Smokeless tobacco: Never Used  Substance Use Topics  . Alcohol use: Not Currently    Comment: quit in 1989   . Drug use: No    Mr.Labelle reports that he has never smoked. He has never used smokeless tobacco. He reports previous alcohol use. He reports that he does not use drugs.  Tobacco Cessation: Never Smoker  Past surgical hx, Family hx, Social hx all reviewed.  Current Outpatient Medications on File Prior to Visit  Medication Sig  . acetaminophen (TYLENOL) 500 MG tablet Take 1,000 mg by mouth every 4 (four) hours as needed for headache (pain).  Marland Kitchen albuterol (VENTOLIN HFA) 108 (90 Base) MCG/ACT inhaler Inhale 2 puffs into the lungs every 4 (four) hours as needed for wheezing or shortness of breath.  . allopurinol (ZYLOPRIM) 100 MG tablet Take 100 mg by mouth 2 (two) times a day.  Marland Kitchen amLODipine (NORVASC) 2.5 MG tablet Take 2.5 mg by mouth daily.  . carvedilol (COREG) 3.125 MG tablet Take 1 tablet (3.125 mg total) by mouth 2 (two) times daily with a meal.  . ferrous sulfate 324 MG TBEC Take 324 mg by mouth daily with breakfast.  . fluticasone (FLONASE) 50 MCG/ACT nasal spray Place 2 sprays into both nostrils daily.  . furosemide (LASIX) 40 MG tablet Take 1 tablet (40 mg total) by mouth daily.  Marland Kitchen levETIRAcetam (KEPPRA) 1000 MG tablet Take 1 tablet (1,000 mg total) by mouth 2 (two) times daily.  . Melatonin 3 MG TABS  Take 2 tablets (6 mg total) by mouth at bedtime.  . mometasone-formoterol (DULERA) 100-5 MCG/ACT AERO Inhale 2 puffs into the lungs 2 (two) times daily.  . montelukast (SINGULAIR) 10 MG tablet Take 1 tablet (10 mg total) by mouth at bedtime.  . pantoprazole (PROTONIX) 40 MG tablet Take 1 tablet (40 mg total) by mouth 2 (two) times daily.  . polyethylene glycol (MIRALAX / GLYCOLAX) 17 g packet Take 17 g by mouth daily as needed for mild constipation.  . pregabalin (LYRICA) 75 MG capsule Take 1 capsule (75 mg total) by mouth 2 (two) times daily.  . sertraline (ZOLOFT) 100 MG tablet Take 2 tablets (200 mg total) by mouth every morning.   No current facility-administered medications on file prior to visit.     Allergies  Allergen Reactions  . Lyrica [Pregabalin] Other (See Comments)    Falls and balance problems    Review Of Systems:  Constitutional:   No  weight loss, night sweats,  Fevers, chills, fatigue, or  lassitude.  HEENT:   No headaches,  Difficulty swallowing,  Tooth/dental problems, or  Sore throat,                No sneezing, itching, ear ache, nasal congestion, post nasal drip,   CV:  No chest pain,  Orthopnea, PND, swelling in lower extremities, anasarca, dizziness, palpitations, syncope.   GI  No heartburn, indigestion, abdominal pain, nausea, vomiting, diarrhea, change in bowel habits, loss of appetite, bloody stools.   Resp: + shortness of breath with exertion none at rest.  No excess mucus, no productive cough,  No non-productive cough,  No coughing up of blood.  No change in color of mucus.  No wheezing.  No chest wall deformity  Skin: no rash or lesions.  GU: no dysuria, change in color of urine, no urgency or frequency.  No flank pain, no hematuria   MS:  No joint pain or swelling.  No decreased range of motion.  No back pain.  Psych:  No change in mood or affect. No depression or anxiety.  No memory loss.   Vital Signs BP 122/72 (BP Location: Right Arm, Cuff  Size: Large)   Pulse 64   Temp 98.2 F (36.8 C) (Oral)   Ht _0  (1.778 m)   Wt 248 lb 6.4 oz (112.7 kg)   SpO2 94%   BMI 35.64 kg/m    Physical  Exam:  General- No distress,  A&Ox3, pleasant ENT: No sinus tenderness, TM clear, pale nasal mucosa, no oral exudate,no post nasal drip, no LAN Cardiac: S1, S2, regular rate and rhythm, no murmur Chest: No wheeze/ rales/ dullness; no accessory muscle use, no nasal flaring, no sternal retractions, few crackles noted per bases. Abd.: Soft Non-tender, ND, BS +, Body mass index is 35.64 kg/m. Ext: No clubbing cyanosis, edema Neuro:  Improving physical conditioning, MAE x 4, A&O x 3, appropriate Skin: No rashes, NO lesions,  warm and dry Psych: normal mood and behavior   Assessment/Plan OSA on CPAP Started Therapy after CPAP titration 06/27/2019 Optimal settings are 11 cm pressure Plan Continue on CPAP at bedtime. You appear to be benefiting from the treatment  Goal is to wear for at least 6 hours each night for maximal clinical benefit. Continue to work on weight loss, as the link between excess weight  and sleep apnea is well established.   Remember to establish a good bedtime routine, and work on sleep hygiene.  Limit daytime naps , avoid stimulants such as caffeine and nicotine close to bedtime, exercise daily to promote sleep quality, avoid heavy , spicy, fried , or rich foods before bed. Ensure adequate exposure to natural light during the day,establish a relaxing bedtime routine with a pleasant sleep environment ( Bedroom between 60 and 67 degrees, turn off bright lights , TV or device screens screens , consider black out curtains or white noise machines) Do not drive if sleepy. Remember to clean mask, tubing, filter, and reservoir once weekly with soapy water.  Follow up with Judson Roch NP, or Dr. Halford Chessman   In 6 weeks  or before as needed.    Post Covid Pulmonary Fibrosis Pulmonary Nodule ( Needs 6 month follow up scan) Follow up HRCT  09/2019 ( Follow up with [facility] after 09/2019 CT Chest) Follow up 6 minute walk  Dyspnea  Physical Deconditioning No longer needs oxygen Plan Continue Dulera and Albuterol as you have been doing.  Rinse mouth after use.  Continue working out in the pool. Continue to work on weight loss.   This appointment was 30 min long with over 50% of the time in direct face-to-face patient care, assessment, plan of care, and follow-up.   Magdalen Spatz, NP 06/28/2019  1:22 PM

## 2019-06-28 NOTE — Progress Notes (Signed)
Reviewed and agree with assessment/plan.   Coralyn Helling, MD [facility] 06/28/2019, 1:38 PM Pager:  [phone]

## 2019-07-13 ENCOUNTER — Telehealth: Payer: Self-pay | Admitting: *Deleted

## 2019-07-13 NOTE — Telephone Encounter (Signed)
Dr. Jeannetta Nap requests a peer to peer phone cal regarding patient's weakness and inability to abduct his right arm.  Dr. Jeannetta Nap [phone]

## 2019-07-14 NOTE — Telephone Encounter (Signed)
L/M for Dr Jeannetta Nap- he had already left for the day- L/M for him ot call me.

## 2019-07-15 NOTE — Telephone Encounter (Signed)
Spoke to Dr Jeannetta Nap- about Kurt [name]- suggested Cervical MRI for shoulder/arm weakness and possible NSU consult- he will order

## 2019-07-16 ENCOUNTER — Other Ambulatory Visit: Payer: Self-pay | Admitting: Family Medicine

## 2019-07-16 DIAGNOSIS — M79601 Pain in right arm: Secondary | ICD-10-CM

## 2019-07-29 NOTE — Telephone Encounter (Signed)
error 

## 2019-08-09 ENCOUNTER — Ambulatory Visit: Payer: Medicare Other | Admitting: Acute Care

## 2019-08-22 ENCOUNTER — Other Ambulatory Visit: Payer: Medicare Other

## 2019-09-01 ENCOUNTER — Encounter: Payer: Self-pay | Admitting: Acute Care

## 2019-09-01 ENCOUNTER — Other Ambulatory Visit: Payer: Self-pay

## 2019-09-01 ENCOUNTER — Ambulatory Visit: Payer: Medicare Other | Admitting: Acute Care

## 2019-09-01 VITALS — BP 130/70 | HR 84 | Temp 96.9°F | Ht 70.5 in | Wt 237.8 lb

## 2019-09-01 DIAGNOSIS — R5381 Other malaise: Secondary | ICD-10-CM

## 2019-09-01 DIAGNOSIS — R296 Repeated falls: Secondary | ICD-10-CM | POA: Diagnosis not present

## 2019-09-01 DIAGNOSIS — Z8616 Personal history of COVID-19: Secondary | ICD-10-CM | POA: Diagnosis not present

## 2019-09-01 DIAGNOSIS — G4733 Obstructive sleep apnea (adult) (pediatric): Secondary | ICD-10-CM

## 2019-09-01 NOTE — Progress Notes (Signed)
History of Present Illness Kurt [name] is a 69 y.o. male never smoker with medical history significant ofchronic diastolic congestive heart failure, hypertension, CKD stage III, recentCOVIDpneumonia (12/6-12/16/ 2020)  recurrent pneumonia with hypoxia and post Covid fibrotic changes. 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 He is also followed for OSA on CPAP by Dr. Halford Chessman.   Kurt [name] Dixonis a 69 y.o.malenever smokerwith medical history significant ofchronic diastolic congestive heart failure, hypertension, CKD stage III, recentCOVIDpneumonia (12/6-12/16/ 2020) and recurrent pneumonia requiring admission 12/28-1/2/2021for hypoxia with CT showing multifocal groundglass opacities with cavitary nodules. Treated with Augmentin and slow steroid taper Readmitted 1/25-02/23/2019 with recurrent respiratory failure with abnormal CT.Bronchoscopy with BAL performed on 02/18/2019 with negative cultures, 60% lymphocytes. Sputum cultures showed Pseudomonas and was treated with cefepime.He was placed on prednisone taper and discharged. He had some slow improvement over time. When last seen by Dr. Vaughan Browner 03/17/2019 he was still on prednisone 40 mg daily and Bactrim.He remained on supplemental oxygen. He is being followed by Dr. Vaughan Browner for post [facility]  He has had a sleep study which was positive for OSA. CPAP titration study shows his optimal settings are 11 cm H2O.Medium Res Med Full Face Mask Mirage Quattro  with heated humidification.     09/01/2019  Pt. Presents for follow up of Covid Pneumonia and OSA. He is a never smoker. Pt. States he has been  doing better, but he has been having issues with falls due to is physical deconditioning. He states he has been falling when he stumbles. He states he has been falling more frequently. He has a walker , but  is not using it much. He has no arm strength. His right arm has very limited range of motion. This is  secondary to a fall. He is being followed by his PCP regarding his gait issues and his falls. He has had an MRI to evaluate this past week. He has been referred to orthopedic surgery. He has been swimming as he is able, but there have been so many swim meets at the [facility] he is unable to get there often.  Pt is no longer wearing his oxygen. He is no longer using his [facility] or rescue inhaler. He states he does not need them. He has continued to lose weight ( intentional weight loss with diet.).  We have discussed that his down Load is good in regard to usage, but his AHI remains high at 23.1. He states he is sleeping really well with the device. His CPAP titration showed optimal pressure is 11 cm H2O. He has a considerable leak around his mask. He would like to try the Auto set mode to see if this improves. If he continues to have a significant leak we will need to send him for a mask fitting. He is agreeable with this plan with follow up in 6 weeks with another down Load to see if there has been any improvement in AHI. Pt continues to ride his motorcycle to the beach and back in one day. I did express concern due to his recent falls. He denies any fever, chest pain, orthopnea or hemoptysis.  He needs follow up with Dr. Vaughan Browner with HRCT and 6 minute walk ( if able)  in September.   Test Results:   08/01/2019-08/30/2019 Down Load  Usage 30/30 days > 4 hours  100% of the time Set pressure of 11 cm H2O + Leaks every night Median 16.4 AHI is 23.4  02/18/2019 Bronch - Interstitial lung disease - Anatomy normal - Mucosa with erythma and easily irritated - Bronchoalveolar lavage was performed.  03/17/2019 Imaging/ Tests: CTA 01/19/2019-scattered irregular groundglass opacities, 2 cavitary nodules in the right upper lobe CTA 02/16/2019-seen bilateral groundglass opacities, reduced size of right lung nodules with improvement in cavitation.  03/25/2019 HRCT Spectrum of findings  compatible with fibrotic interstitial lung disease without honeycombing and without apicobasilar gradient. Consolidative and ground-glass opacities have substantially improved in the interval, while the fibrotic changes have mildly worsened. 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 Findings are suggestive of an alternative diagnosis (not UIP) per consensus guidelines Two solid pulmonary nodules, largest 1.1 cm in the right middle lobe, both decreased since 02/16/2019 chest CT, probably benign. Suggest follow-up chest CT in 6 months. 3. Aortic Atherosclerosis (ICD10-I70.0).   6 minute walk 04/07/2019      Interpretation   Distance completed 211 meters    Tech Comments: Patient ambulated at a steady pace, with walker. Patient sats were 87% on RA at the end of 6 min walk, sats immediately started climbing into the 90's. Patient stated he has O2, that he uses prn, &night       04/09/2019 Sleep Study IMPRESSIONS - Severe obstructive sleep apnea with an AHI of 41.4 and SpO2 low of 70%. - Two liters supplemental oxygen applied due to persistent hypoxemia. - He did not meet split night study criteria and was not tried on CPAP during this study.  DIAGNOSIS - Obstructive Sleep Apnea (327.23 [G47.33 ICD-10]) - Nocturnal Hypoxemia (327.26 [G47.36 ICD-10])     CBC Latest Ref Rng & Units 03/11/2019 03/08/2019 03/03/2019  WBC 4.0 - 10.5 K/uL 12.9(H) 13.7(H) 13.5(H)  Hemoglobin 13.0 - 17.0 g/dL 13.7 12.8(L) 12.1(L)  Hematocrit 39 - 52 % 42.6 41.2 38.2(L)  Platelets 150 - 400 K/uL 207 259 333    BMP Latest Ref Rng & Units 03/11/2019 03/08/2019 03/03/2019  Glucose 70 - 99 mg/dL 110(H) 94 98  BUN 8 - 23 mg/dL 30(H) 30(H) 32(H)  Creatinine 0.61 - 1.24 mg/dL 1.38(H) 1.34(H) 1.34(H)  Sodium 135 - 145 mmol/L 136 138 137  Potassium 3.5 - 5.1 mmol/L 3.9 3.9 4.3  Chloride 98 - 111 mmol/L 96(L) 100 98  CO2 22 - 32 mmol/L _0 Calcium  8.9 - 10.3 mg/dL 9.2 8.9 9.0    BNP    Component Value Date/Time   BNP 47.6 02/15/2019 2240    ProBNP No results found for: PROBNP  PFT    Component Value Date/Time   FEV1PRE 2.49 05/26/2019 1359   FEV1POST 2.44 05/26/2019 1359   FVCPRE 2.89 05/26/2019 1359   FVCPOST 2.88 05/26/2019 1359   TLC 5.22 05/26/2019 1359   DLCOUNC 19.87 05/26/2019 1359   PREFEV1FVCRT 86 05/26/2019 1359   PSTFEV1FVCRT 85 05/26/2019 1359    No results found.   Past medical hx Past Medical History:  Diagnosis Date  . Arthritis   . CHF (congestive heart failure) (HCC)    diastolic dysfunction  . Chronic back pain   . COVID-19   . Depression   . Diverticulitis   . Duodenal ulcer 05/2018   Treated with 3 clips/Dr. Henrene Pastor  . Fall off scafolding 2003   prolonged hospitalization- multiple BUE surgery, concussion  . GI bleed 05/2018  . GIB (gastrointestinal bleeding) 2016   Antral ulcer/duodenal bleed per Endoscopy by Dr. Benson Norway  . Hypertension   . PTSD (post-traumatic stress disorder)    after  a Fall   . Renal insufficiency    Decrease in GFR- PCP removed all NSAIDS & aspirin      Social History   Tobacco Use  . Smoking status: Never Smoker  . Smokeless tobacco: Never Used  Vaping Use  . Vaping Use: Never used  Substance Use Topics  . Alcohol use: Not Currently    Comment: quit in 1989   . Drug use: No    Mr.Alonzo reports that he has never smoked. He has never used smokeless tobacco. He reports previous alcohol use. He reports that he does not use drugs.  Tobacco Cessation: Never smoker  Past surgical hx, Family hx, Social hx all reviewed.  Current Outpatient Medications on File Prior to Visit  Medication Sig  . acetaminophen (TYLENOL) 500 MG tablet Take 1,000 mg by mouth every 4 (four) hours as needed for headache (pain).  Marland Kitchen allopurinol (ZYLOPRIM) 100 MG tablet Take 100 mg by mouth 2 (two) times a day.  Marland Kitchen amLODipine (NORVASC) 2.5 MG tablet Take 2.5 mg by mouth daily.  .  carvedilol (COREG) 3.125 MG tablet Take 1 tablet (3.125 mg total) by mouth 2 (two) times daily with a meal.  . ferrous sulfate 324 MG TBEC Take 324 mg by mouth daily with breakfast.  . fluticasone (FLONASE) 50 MCG/ACT nasal spray Place 2 sprays into both nostrils daily.  . furosemide (LASIX) 40 MG tablet Take 1 tablet (40 mg total) by mouth daily.  Marland Kitchen levETIRAcetam (KEPPRA) 1000 MG tablet Take 1 tablet (1,000 mg total) by mouth 2 (two) times daily.  . Melatonin 3 MG TABS Take 2 tablets (6 mg total) by mouth at bedtime.  . pantoprazole (PROTONIX) 40 MG tablet Take 1 tablet (40 mg total) by mouth 2 (two) times daily.  . polyethylene glycol (MIRALAX / GLYCOLAX) 17 g packet Take 17 g by mouth daily as needed for mild constipation.  . sertraline (ZOLOFT) 100 MG tablet Take 2 tablets (200 mg total) by mouth every morning.  Marland Kitchen albuterol (VENTOLIN HFA) 108 (90 Base) MCG/ACT inhaler Inhale 2 puffs into the lungs every 4 (four) hours as needed for wheezing or shortness of breath. (Patient not taking: Reported on 09/01/2019)  . montelukast (SINGULAIR) 10 MG tablet Take 1 tablet (10 mg total) by mouth at bedtime. (Patient not taking: Reported on 09/01/2019)   No current facility-administered medications on file prior to visit.     Allergies  Allergen Reactions  . Lyrica [Pregabalin] Other (See Comments)    Falls and balance problems    Review Of Systems:  Constitutional:   No  weight loss, night sweats,  Fevers, chills, fatigue, or  lassitude.  HEENT:   No headaches,  Difficulty swallowing,  Tooth/dental problems, or  Sore throat,                No sneezing, itching, ear ache, nasal congestion, post nasal drip,   CV:  No chest pain,  Orthopnea, PND, swelling in lower extremities, anasarca, dizziness, palpitations, syncope.   GI  No heartburn, indigestion, abdominal pain, nausea, vomiting, diarrhea, change in bowel habits, loss of appetite, bloody stools.   Resp: No shortness of breath with exertion  or at rest.  No excess mucus, no productive cough,  No non-productive cough,  No coughing up of blood.  No change in color of mucus.  No wheezing.  No chest wall deformity  Skin: no rash or lesions.  GU: no dysuria, change in color of urine, no urgency or frequency.  No flank pain, no hematuria   MS:  + joint pain or swelling.  + decreased range of motion.  + back pain. + falls  Psych:  No change in mood or affect. No depression or anxiety.  No memory loss.   Vital Signs BP 130/70 (BP Location: Left Arm, Cuff Size: Normal)   Pulse 84   Temp (!) 96.9 F (36.1 C) (Oral)   Ht 5' 10.5" (1.791 m)   Wt 237 lb 12.8 oz (107.9 kg)   SpO2 96%   BMI 33.64 kg/m    Physical Exam:  General- No distress,  A&Ox3, pleasant ENT: No sinus tenderness, TM clear, pale nasal mucosa, no oral exudate,no post nasal drip, no LAN Cardiac: S1, S2, regular rate and rhythm, no murmur Chest: No wheeze/ rales/ dullness; no accessory muscle use, no nasal flaring, no sternal retractions, diminished per bases Abd.: Soft Non-tender, ND, BS +, Body mass index is 33.64 kg/m. Ext: No clubbing cyanosis, edema Neuro:  Deconditioned, MAE x 4,Balance issues, recent falls Skin: No rashes,No lesions,  warm and dry Psych: normal mood and behavior   Assessment/Plan  OSA on CPAP Started Therapy after CPAP titration 06/27/2019 Optimal settings are 11 cm pressure per sleep titration study AHI remains 23 on setting of 11 cm H2O.  Plan We will change your CPAP mode to Auto Set 5-15 cm H2O Continue on CPAP at bedtime. You appear to be benefiting from the treatment Goal is to wear for at least 6 hours each night for maximal clinical benefit. Continue to work on weight loss, as the link between excess weight  and sleep apnea is well established.  Remember to establish a good bedtime routine, and work on sleep hygiene. Limit daytime naps , avoid stimulants such as caffeine and nicotine close to bedtime, exercise daily to  promote sleep quality, avoid heavy , spicy, fried , or rich foods before bed. Ensure adequate exposure to natural light during the day,establish a relaxing bedtime routine with a pleasant sleep environment ( Bedroom between 60 and 67 degrees, turn off bright lights , TV or device screens screens , consider black out curtains or white noise machines) Do not drive if sleepy. Remember to clean mask, tubing, filter, and reservoir once weekly with soapy water.  Follow up tele visit with Judson Roch NP, or Dr. Halford Chessman  In 6 weeks  to review down Load after change in mode.   Post Covid Pulmonary Fibrosis Pulmonary Nodule ( Needs 6 month follow up scan) No longer wearing oxygen ( sats 96% after ambulating to room) No longer using Dulera or Albuterol Follow up HRCT 09/2019 ( Follow up with Mannam after 09/2019 CT Chest) 10:30 am on 9/14 at [facility] Follow up 6 minute walk 09/2019 visit with Dr, Vaughan Browner Continue to exercise as able    Dyspnea  Physical Deconditioning No longer wearing  oxygen No longer using Dulera of Albuterol Plan Continue working out in the pool. Continue to work on weight loss.  Falls  Not using his walker or cane at all times Plan Continue follow up with PCP re MRI results Please use walker or cane to protect yourself from injury with falls. Follow up with Dr. Trenton Gammon   This appointment was 30 min long with over 50% of the time in direct face-to-face patient care, assessment, plan of care, and follow-up.    Magdalen Spatz, NP 09/01/2019  4:02 PM

## 2019-09-01 NOTE — Patient Instructions (Addendum)
It is good to see you today. We will change your CPAP mode to Auto Set 5-15 cm H2O Continue on CPAP at bedtime. You appear to be benefiting from the treatment Goal is to wear for at least 6 hours each night for maximal clinical benefit. Continue to work on weight loss, as the link between excess weight  and sleep apnea is well established.  Remember to establish a good bedtime routine, and work on sleep hygiene. Limit daytime naps , avoid stimulants such as caffeine and nicotine close to bedtime, exercise daily to promote sleep quality, avoid heavy , spicy, fried , or rich foods before bed. Ensure adequate exposure to natural light during the day,establish a relaxing bedtime routine with a pleasant sleep environment ( Bedroom between 60 and 67 degrees, turn off bright lights , TV or device screens screens , consider black out curtains or white noise machines) Do not drive if sleepy. Remember to clean mask, tubing, filter, and reservoir once weekly with soapy water.  Follow up tele visit with Maralyn Sago NP, or Dr. Craige Cotta  In 6 weeks  to review down Load after change in mode.  Follow up HRCT 09/2019 ( Follow up with Mannam after 09/2019 CT Chest) Follow up 6 minute walk 09/2019 Continue follow up with PCP regarding falls Please use walker or cane to protect yourself from falls. Follow up with Dr. Dutch Quint   Please contact office for sooner follow up if symptoms do not improve or worsen or seek emergency care

## 2019-09-02 ENCOUNTER — Encounter: Payer: Self-pay | Admitting: Acute Care

## 2019-10-05 ENCOUNTER — Other Ambulatory Visit: Payer: Self-pay

## 2019-10-05 ENCOUNTER — Ambulatory Visit (HOSPITAL_COMMUNITY)
Admission: RE | Admit: 2019-10-05 | Discharge: 2019-10-05 | Disposition: A | Discharge status: Home / Self Care | Payer: Medicare Other | Source: Ambulatory Visit | Attending: Acute Care | Admitting: Acute Care

## 2019-10-05 DIAGNOSIS — Z8616 Personal history of COVID-19: Secondary | ICD-10-CM | POA: Insufficient documentation

## 2019-10-05 DIAGNOSIS — R918 Other nonspecific abnormal finding of lung field: Secondary | ICD-10-CM | POA: Diagnosis not present

## 2019-10-07 NOTE — Progress Notes (Signed)
Please let patient know his CT showed no significant progression of his fibrosis and that nodules seen on the previous scan are resolved, and were most likely infection or inflammation. This is good news.  Follow up as is scheduled . Thanks so much

## 2019-10-07 NOTE — Progress Notes (Signed)
Tried calling pt and he did not answer- LMTCB x 1 9/16

## 2019-10-08 NOTE — Progress Notes (Signed)
Patient called with results of recent CT scan. Patient verbalized understanding and did not have any questions. He has a follow up appointment next week.

## 2019-10-13 ENCOUNTER — Ambulatory Visit: Payer: Medicare Other

## 2019-10-13 ENCOUNTER — Ambulatory Visit: Payer: Medicare Other | Admitting: Acute Care

## 2019-10-13 NOTE — Progress Notes (Deleted)
History of Present Illness Kurt [name] is a 69 y.o. male never smokerwithmedical history significant ofchronic diastolic congestive heart failure, hypertension, CKD stage III, recentCOVIDpneumonia (12/6-12/16/ 2020) recurrent pneumonia withhypoxiaand post Covid fibrotic changes.He is followed by Dr. Isaiah [name] forPostCovid Fibrosis management.He is also followed for OSA on CPAP by Dr. Craige [name].   10/13/2019  Pt. Presents for follow up after CT Chest . He states he has been   Test Results:  CT Chest 10/05/2019 IMPRESSION: No significant interval change in mild fibrotic change with a slight apical to basal gradient, primarily characterized by irregular peripheral interstitial opacity at the lung bases. No significant bronchiolectasis or honeycombing. Scattered ground-glass opacity and mosaic attenuation of the airspaces is reduced compared to prior examination. Findings are most in keeping with sequelae of prior infection or inflammation without specific evidence of fibrotic interstitial lung disease. Previously seen small nodules of the right upper lobe and left lower lobe are resolved, consistent with resolved sequelae of prior infection or inflammation.  6 minute Walk  CBC Latest Ref Rng & Units 03/11/2019 03/08/2019 03/03/2019  WBC 4.0 - 10.5 K/uL 12.9(H) 13.7(H) 13.5(H)  Hemoglobin 13.0 - 17.0 g/dL 19.5 12.8(L) 12.1(L)  Hematocrit 39 - 52 % 42.6 41.2 38.2(L)  Platelets 150 - 400 K/uL 207 259 333    BMP Latest Ref Rng & Units 03/11/2019 03/08/2019 03/03/2019  Glucose 70 - 99 mg/dL 093(O) 94 98  BUN 8 - 23 mg/dL 67(T) 24(P) 80(D)  Creatinine 0.61 - 1.24 mg/dL 9.83(J) 8.25(K) 5.39(J)  Sodium 135 - 145 mmol/L 136 138 137  Potassium 3.5 - 5.1 mmol/L 3.9 3.9 4.3  Chloride 98 - 111 mmol/L 96(L) 100 98  CO2 22 - 32 mmol/L 28 27 28   Calcium 8.9 - 10.3 mg/dL 9.2 8.9 9.0    BNP    Component Value Date/Time   BNP 47.6 02/15/2019 2240    ProBNP No results found for:  PROBNP  PFT    Component Value Date/Time   FEV1PRE 2.49 05/26/2019 1359   FEV1POST 2.44 05/26/2019 1359   FVCPRE 2.89 05/26/2019 1359   FVCPOST 2.88 05/26/2019 1359   TLC 5.22 05/26/2019 1359   DLCOUNC 19.87 05/26/2019 1359   PREFEV1FVCRT 86 05/26/2019 1359   PSTFEV1FVCRT 85 05/26/2019 1359    CT Chest High Resolution  Result Date: 10/05/2019 CLINICAL DATA:  Follow-up abnormal CT, shortness of breath, COVID infection January 2021 EXAM: CT CHEST WITHOUT CONTRAST TECHNIQUE: Multidetector CT imaging of the chest was performed following the standard protocol without intravenous contrast. High resolution imaging of the lungs, as well as inspiratory and expiratory imaging, was performed. COMPARISON:  03/25/2019, 02/16/2019, 01/19/2019 FINDINGS: Cardiovascular: No significant vascular findings. Normal heart size. No pericardial effusion. Mediastinum/Nodes: No enlarged mediastinal, hilar, or axillary lymph nodes. Thyroid gland, trachea, and esophagus demonstrate no significant findings. Lungs/Pleura: No significant interval change in mild fibrotic change with a slight apical to basal gradient, primarily characterized by irregular peripheral interstitial opacity at the lung bases. No significant bronchiolectasis or honeycombing. Scattered ground-glass opacity and mosaic attenuation of the airspaces is reduced compared to prior examination. No significant air trapping on expiratory phase imaging. Previously seen small nodules of the right upper lobe and left lower lobe are resolved. No pleural effusion or pneumothorax. Upper Abdomen: No acute abnormality. Musculoskeletal: No chest wall mass or suspicious bone lesions identified. IMPRESSION: 1. No significant interval change in mild fibrotic change with a slight apical to basal gradient, primarily characterized by irregular peripheral interstitial opacity at the lung bases.  No significant bronchiolectasis or honeycombing. Scattered ground-glass opacity and  mosaic attenuation of the airspaces is reduced compared to prior examination. Findings are most in keeping with sequelae of prior infection or inflammation without specific evidence of fibrotic interstitial lung disease. 2. Previously seen small nodules of the right upper lobe and left lower lobe are resolved, consistent with resolved sequelae of prior infection or inflammation. Electronically Signed   By: Lauralyn [name] M.D.   On: 10/05/2019 15:58     Past medical hx Past Medical History:  Diagnosis Date  . Arthritis   . CHF (congestive heart failure) (HCC)    diastolic dysfunction  . Chronic back pain   . COVID-19   . Depression   . Diverticulitis   . Duodenal ulcer 05/2018   Treated with 3 clips/Dr. Marina [name]  . Fall off scafolding 2003   prolonged hospitalization- multiple BUE surgery, concussion  . GI bleed 05/2018  . GIB (gastrointestinal bleeding) 2016   Antral ulcer/duodenal bleed per Endoscopy by Dr. Elnoria [name]  . Hypertension   . PTSD (post-traumatic stress disorder)    after  a Fall   . Renal insufficiency    Decrease in GFR- PCP removed all NSAIDS & aspirin      Social History   Tobacco Use  . Smoking status: Never Smoker  . Smokeless tobacco: Never Used  Vaping Use  . Vaping Use: Never used  Substance Use Topics  . Alcohol use: Not Currently    Comment: quit in 1989   . Drug use: No    Kurt [name] reports that he has never smoked. He has never used smokeless tobacco. He reports previous alcohol use. He reports that he does not use drugs.  Tobacco Cessation: Counseling given: Not Answered   Past surgical hx, Family hx, Social hx all reviewed.  Current Outpatient Medications on File Prior to Visit  Medication Sig  . acetaminophen (TYLENOL) 500 MG tablet Take 1,000 mg by mouth every 4 (four) hours as needed for headache (pain).  Marland Kitchen albuterol (VENTOLIN HFA) 108 (90 Base) MCG/ACT inhaler Inhale 2 puffs into the lungs every 4 (four) hours as needed for wheezing or shortness  of breath. (Patient not taking: Reported on 09/01/2019)  . allopurinol (ZYLOPRIM) 100 MG tablet Take 100 mg by mouth 2 (two) times a day.  Marland Kitchen amLODipine (NORVASC) 2.5 MG tablet Take 2.5 mg by mouth daily.  . carvedilol (COREG) 3.125 MG tablet Take 1 tablet (3.125 mg total) by mouth 2 (two) times daily with a meal.  . ferrous sulfate 324 MG TBEC Take 324 mg by mouth daily with breakfast.  . fluticasone (FLONASE) 50 MCG/ACT nasal spray Place 2 sprays into both nostrils daily.  . furosemide (LASIX) 40 MG tablet Take 1 tablet (40 mg total) by mouth daily.  Marland Kitchen levETIRAcetam (KEPPRA) 1000 MG tablet Take 1 tablet (1,000 mg total) by mouth 2 (two) times daily.  . Melatonin 3 MG TABS Take 2 tablets (6 mg total) by mouth at bedtime.  . montelukast (SINGULAIR) 10 MG tablet Take 1 tablet (10 mg total) by mouth at bedtime. (Patient not taking: Reported on 09/01/2019)  . pantoprazole (PROTONIX) 40 MG tablet Take 1 tablet (40 mg total) by mouth 2 (two) times daily.  . polyethylene glycol (MIRALAX / GLYCOLAX) 17 g packet Take 17 g by mouth daily as needed for mild constipation.  . sertraline (ZOLOFT) 100 MG tablet Take 2 tablets (200 mg total) by mouth every morning.   No current facility-administered medications on file prior  to visit.     Allergies  Allergen Reactions  . Lyrica [Pregabalin] Other (See Comments)    Falls and balance problems    Review Of Systems:  Constitutional:   No  weight loss, night sweats,  Fevers, chills, fatigue, or  lassitude.  HEENT:   No headaches,  Difficulty swallowing,  Tooth/dental problems, or  Sore throat,                No sneezing, itching, ear ache, nasal congestion, post nasal drip,   CV:  No chest pain,  Orthopnea, PND, swelling in lower extremities, anasarca, dizziness, palpitations, syncope.   GI  No heartburn, indigestion, abdominal pain, nausea, vomiting, diarrhea, change in bowel habits, loss of appetite, bloody stools.   Resp: No shortness of breath with  exertion or at rest.  No excess mucus, no productive cough,  No non-productive cough,  No coughing up of blood.  No change in color of mucus.  No wheezing.  No chest wall deformity  Skin: no rash or lesions.  GU: no dysuria, change in color of urine, no urgency or frequency.  No flank pain, no hematuria   MS:  No joint pain or swelling.  No decreased range of motion.  No back pain.  Psych:  No change in mood or affect. No depression or anxiety.  No memory loss.   Vital Signs There were no vitals taken for this visit.   Physical Exam:  General- No distress,  A&Ox3 ENT: No sinus tenderness, TM clear, pale nasal mucosa, no oral exudate,no post nasal drip, no LAN Cardiac: S1, S2, regular rate and rhythm, no murmur Chest: No wheeze/ rales/ dullness; no accessory muscle use, no nasal flaring, no sternal retractions Abd.: Soft Non-tender Ext: No clubbing cyanosis, edema Neuro:  normal strength Skin: No rashes, warm and dry Psych: normal mood and behavior   Assessment/Plan  No problem-specific Assessment & Plan notes found for this encounter.    Bevelyn Ngo, NP 10/13/2019  1:53 PM

## 2019-10-27 ENCOUNTER — Ambulatory Visit: Payer: Medicare Other | Admitting: Acute Care

## 2019-10-27 ENCOUNTER — Ambulatory Visit: Payer: Medicare Other

## 2019-11-09 ENCOUNTER — Ambulatory Visit: Payer: Medicare Other

## 2019-11-17 ENCOUNTER — Ambulatory Visit: Payer: Medicare Other | Admitting: Acute Care

## 2019-11-17 ENCOUNTER — Ambulatory Visit: Payer: Medicare Other

## 2019-11-17 ENCOUNTER — Other Ambulatory Visit: Payer: Self-pay

## 2019-11-17 ENCOUNTER — Encounter: Payer: Self-pay | Admitting: Acute Care

## 2019-11-17 VITALS — BP 130/80 | HR 72 | Temp 97.7°F | Ht 70.5 in | Wt 232.6 lb

## 2019-11-17 DIAGNOSIS — Z23 Encounter for immunization: Secondary | ICD-10-CM | POA: Diagnosis not present

## 2019-11-17 DIAGNOSIS — G4733 Obstructive sleep apnea (adult) (pediatric): Secondary | ICD-10-CM

## 2019-11-17 DIAGNOSIS — R0600 Dyspnea, unspecified: Secondary | ICD-10-CM | POA: Diagnosis not present

## 2019-11-17 DIAGNOSIS — J8410 Pulmonary fibrosis, unspecified: Secondary | ICD-10-CM | POA: Diagnosis not present

## 2019-11-17 DIAGNOSIS — Z0000 Encounter for general adult medical examination without abnormal findings: Secondary | ICD-10-CM

## 2019-11-17 NOTE — Progress Notes (Signed)
Six Minute Walk - 11/17/19 1451      Six Minute Walk   Medications taken before test (dose and time) Albuterol sulfate 2 puffs, allopurinol 100mg , Amlodipine 2.5mg , carvedilol 3.125mg , flonase, Lasix 40mg , Protonix 40mg , sertaline 100mg     Supplemental oxygen during test? No    Lap distance in meters  34 meters    Laps Completed 4    Partial lap (in meters) 0 meters    Baseline BP (sitting) 116/80    Baseline Heartrate 67    Baseline Dyspnea (Borg Scale) 0    Baseline Fatigue (Borg Scale) 0    Baseline SPO2 100 %      End of Test Values    BP (sitting) 140/80    Heartrate 86    Dyspnea (Borg Scale) 0.5    Fatigue (Borg Scale) 0    SPO2 91 %      2 Minutes Post Walk Values   BP (sitting) 130/80    Heartrate 72    SPO2 98 %    Stopped or paused before six minutes? No    Other Symptoms at end of exercise: Hip pain      Interpretation   Distance completed 136 meters    Tech Comments: Hip pain and back pain are normal for patient.  Patient walked at an average pace.  No stops for rest.  Tolerated walk well.

## 2019-11-17 NOTE — Progress Notes (Signed)
History of Present Illness Kurt [name] is a 69 y.o. male never smokerwithmedical history significant ofchronic diastolic congestive heart failure, hypertension, CKD stage III, recentCOVIDpneumonia (12/6-12/16/ 2020) recurrent pneumonia withhypoxiaand post Covid fibrotic changes.He is followed by Dr. Vaughan [name] forPostCovid Fibrosis management.He is also followed for OSA on CPAP by Dr. Halford [name].  Flu vaccine given 11/17/2019 Covid Booster scheduled  Dec 22, 2019   11/17/2019  Pt. Presents for follow up for post Covid  6 minute walk, CPAP setting changes, and general follow up. Marland Kitchen  He states he has been doing really well. He has just returned from a trip to the [place] where he rode his Motorcycle in 6 different states.  He has completed his goal of riding his bike in every state in the country. Six minute walk is documented below. He did tolerated this well. HR CT Chest  Done 09/2019 looks stable , and previously seen nodules have resolved.  Pt is no longer using his inhalers, maintenance or rescue. He is checking his oxygen sats and they never drop below 95%. He states he does have a bit of brain fog post Covid, and is hopeful it will resolve. He denies any fever, chest pain, orthopnea or hemoptysis.  He states he is compliant with his CPAP every night. The adjustment to auto set has improved AHI from 23.4 to 11.4. He states he has solved the issue he has had with a leak. He would like to try increasing is range from 5-15 to 5-18. We will see if this continues to improve his AHI. If not we will refer for split night study.  He is still swimming at the [employer] several times a week. He is active and walking much better. He does carry a cane, but he does not appear to need it.    Test Results:  09/2019 HRCT No significant interval change in mild fibrotic change with a slight apical to basal gradient, primarily characterized by irregular peripheral interstitial opacity at the lung  bases. No significant bronchiolectasis or honeycombing. Scattered ground-glass opacity and mosaic attenuation of the airspaces is reduced compared to prior examination. No significant air trapping on expiratory phase imaging. Previously seen small nodules of the right upper lobe and left lower lobe are resolved. No pleural effusion or Pneumothorax.  No significant interval change in mild fibrotic change with a slight apical to basal gradient, primarily characterized by irregular peripheral interstitial opacity at the lung bases. No significant bronchiolectasis or honeycombing. Scattered ground-glass opacity and mosaic attenuation of the airspaces is reduced compared to prior examination. Findings are most in keeping with sequelae of prior infection or inflammation without specific evidence of fibrotic interstitial lung disease. 2. Previously seen small nodules of the right upper lobe and left lower lobe are resolved, consistent with resolved sequelae of prior infection or inflammation.    Six Minute Walk - 11/17/19 1451              Six Minute Walk    Medications taken before test (dose and time) Albuterol sulfate 2 puffs, allopurinol 159m, Amlodipine 2.583m carvedilol 3.12549mflonase, Lasix 58m66mrotonix 58mg72mrtaline 100mg 20mSupplemental oxygen during test? No     Lap distance in meters  34 meters     Laps Completed 4     Partial lap (in meters) 0 meters     Baseline BP (sitting) 116/80     Baseline Heartrate 67     Baseline Dyspnea (Borg Scale) 0  Baseline Fatigue (Borg Scale) 0     Baseline SPO2 100 %          End of Test Values     BP (sitting) 140/80     Heartrate 86     Dyspnea (Borg Scale) 0.5     Fatigue (Borg Scale) 0     SPO2 91 %          2 Minutes Post Walk Values    BP (sitting) 130/80     Heartrate 72     SPO2 98 %     Stopped or paused before six minutes? No     Other Symptoms at end of exercise: Hip pain           Interpretation    Distance completed 136 meters     Tech Comments: Hip pain and back pain are normal for patient.  Patient walked at an average pace.  No stops for rest.  Tolerated walk well.           Down Load 10/17/2019-11/15/2019  Date of NPSG 04/01/19: AHI 41.4, SpO2 70%, required 2 liters supplemental oxygen during the study.  Down Load     08/01/2019-08/30/2019 Down Load  Usage 30/30 days > 4 hours  100% of the time Set pressure of 11 cm H2O + Leaks every night Median 16.4 AHI is 23.4    02/18/2019 Bronch - Interstitial lung disease - Anatomy normal - Mucosa with erythma and easily irritated - Bronchoalveolar lavage was performed.  03/17/2019 Imaging/ Tests: CTA 01/19/2019-scattered irregular groundglass opacities, 2 cavitary nodules in the right upper lobe CTA 02/16/2019-seen bilateral groundglass opacities, reduced size of right lung nodules with improvement in cavitation.  03/25/2019 HRCT Spectrum of findings compatible with fibrotic interstitial lung disease without honeycombing and without apicobasilar gradient. Consolidative and ground-glass opacities have substantially improved in the interval, while the fibrotic changes have mildly worsened. 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 Findings are suggestive of an alternative diagnosis (not UIP) per consensus guidelines Two solid pulmonary nodules, largest 1.1 cm in the right middle lobe, both decreased since 02/16/2019 chest CT, probably benign. Suggest follow-up chest CT in 6 months. 3. Aortic Atherosclerosis (ICD10-I70.0).                CBC Latest Ref Rng & Units 03/11/2019 03/08/2019 03/03/2019  WBC 4.0 - 10.5 K/uL 12.9(H) 13.7(H) 13.5(H)  Hemoglobin 13.0 - 17.0 g/dL 13.7 12.8(L) 12.1(L)  Hematocrit 39 - 52 % 42.6 41.2 38.2(L)  Platelets 150 - 400 K/uL 207 259 333    BMP Latest Ref Rng & Units 03/11/2019 03/08/2019 03/03/2019   Glucose 70 - 99 mg/dL 110(H) 94 98  BUN 8 - 23 mg/dL 30(H) 30(H) 32(H)  Creatinine 0.61 - 1.24 mg/dL 1.38(H) 1.34(H) 1.34(H)  Sodium 135 - 145 mmol/L 136 138 137  Potassium 3.5 - 5.1 mmol/L 3.9 3.9 4.3  Chloride 98 - 111 mmol/L 96(L) 100 98  CO2 22 - 32 mmol/L _0 Calcium 8.9 - 10.3 mg/dL 9.2 8.9 9.0    BNP    Component Value Date/Time   BNP 47.6 02/15/2019 2240    ProBNP No results found for: PROBNP  PFT    Component Value Date/Time   FEV1PRE 2.49 05/26/2019 1359   FEV1POST 2.44 05/26/2019 1359   FVCPRE 2.89 05/26/2019 1359   FVCPOST 2.88 05/26/2019 1359   TLC 5.22 05/26/2019 1359   DLCOUNC 19.87 05/26/2019 1359   PREFEV1FVCRT 86 05/26/2019 1359  PSTFEV1FVCRT 85 05/26/2019 1359    No results found.   Past medical hx Past Medical History:  Diagnosis Date  . Arthritis   . CHF (congestive heart failure) (HCC)    diastolic dysfunction  . Chronic back pain   . COVID-19   . Depression   . Diverticulitis   . Duodenal ulcer 05/2018   Treated with 3 clips/Dr. Henrene Pastor  . Fall off scafolding 2003   prolonged hospitalization- multiple BUE surgery, concussion  . GI bleed 05/2018  . GIB (gastrointestinal bleeding) 2016   Antral ulcer/duodenal bleed per Endoscopy by Dr. Benson Norway  . Hypertension   . PTSD (post-traumatic stress disorder)    after  a Fall   . Renal insufficiency    Decrease in GFR- PCP removed all NSAIDS & aspirin      Social History   Tobacco Use  . Smoking status: Never Smoker  . Smokeless tobacco: Never Used  Vaping Use  . Vaping Use: Never used  Substance Use Topics  . Alcohol use: Not Currently    Comment: quit in 1989   . Drug use: No    Mr.Klecker reports that he has never smoked. He has never used smokeless tobacco. He reports previous alcohol use. He reports that he does not use drugs.  Tobacco Cessation: Never smoker  Past surgical hx, Family hx, Social hx all reviewed.  Current Outpatient Medications on File Prior to Visit   Medication Sig  . acetaminophen (TYLENOL) 500 MG tablet Take 1,000 mg by mouth every 4 (four) hours as needed for headache (pain).  Marland Kitchen allopurinol (ZYLOPRIM) 100 MG tablet Take 100 mg by mouth 2 (two) times a day.  Marland Kitchen amLODipine (NORVASC) 2.5 MG tablet Take 2.5 mg by mouth daily.  . carvedilol (COREG) 3.125 MG tablet Take 1 tablet (3.125 mg total) by mouth 2 (two) times daily with a meal.  . ferrous sulfate 324 MG TBEC Take 324 mg by mouth daily with breakfast.  . fluticasone (FLONASE) 50 MCG/ACT nasal spray Place 2 sprays into both nostrils daily.  . furosemide (LASIX) 40 MG tablet Take 1 tablet (40 mg total) by mouth daily.  Marland Kitchen levETIRAcetam (KEPPRA) 1000 MG tablet Take 1 tablet (1,000 mg total) by mouth 2 (two) times daily.  . Melatonin 3 MG TABS Take 2 tablets (6 mg total) by mouth at bedtime.  . montelukast (SINGULAIR) 10 MG tablet Take 1 tablet (10 mg total) by mouth at bedtime.  . pantoprazole (PROTONIX) 40 MG tablet Take 1 tablet (40 mg total) by mouth 2 (two) times daily.  . polyethylene glycol (MIRALAX / GLYCOLAX) 17 g packet Take 17 g by mouth daily as needed for mild constipation.  . sertraline (ZOLOFT) 100 MG tablet Take 2 tablets (200 mg total) by mouth every morning.  Marland Kitchen albuterol (VENTOLIN HFA) 108 (90 Base) MCG/ACT inhaler Inhale 2 puffs into the lungs every 4 (four) hours as needed for wheezing or shortness of breath. (Patient not taking: Reported on 11/17/2019)  . DULERA 100-5 MCG/ACT AERO Inhale 2 puffs into the lungs 2 (two) times daily. (Patient not taking: Reported on 11/17/2019)   No current facility-administered medications on file prior to visit.     Allergies  Allergen Reactions  . Lyrica [Pregabalin] Other (See Comments)    Falls and balance problems    Review Of Systems:  Constitutional:   No  weight loss, night sweats,  Fevers, chills, fatigue, or  lassitude.  HEENT:   No headaches,  Difficulty swallowing,  Tooth/dental problems,  or  Sore throat,                 No sneezing, itching, ear ache, nasal congestion, post nasal drip,   CV:  No chest pain,  Orthopnea, PND, swelling in lower extremities, anasarca, dizziness, palpitations, syncope.   GI  No heartburn, indigestion, abdominal pain, nausea, vomiting, diarrhea, change in bowel habits, loss of appetite, bloody stools.   Resp: No shortness of breath with exertion or at rest.  No excess mucus, no productive cough,  No non-productive cough,  No coughing up of blood.  No change in color of mucus.  No wheezing.  No chest wall deformity  Skin: no rash or lesions.  GU: no dysuria, change in color of urine, no urgency or frequency.  No flank pain, no hematuria   MS:  + right shoulder joint pain no  swelling. + decreased range of motion.  No back pain.  Psych:  No change in mood or affect. No depression or anxiety.  No memory loss.   Vital Signs BP 130/80 (BP Location: Left Arm, Patient Position: Sitting, Cuff Size: Large)   Pulse 72   Temp 97.7 F (36.5 C) (Temporal)   Ht 5' 10.5" (1.791 m)   Wt 232 lb 9.6 oz (105.5 kg)   SpO2 98%   BMI 32.90 kg/m    Physical Exam:  General- No distress,  A&Ox3, pleasant  ENT: No sinus tenderness, TM clear, pale nasal mucosa, no oral exudate,no post nasal drip, no LAN Cardiac: S1, S2, regular rate and rhythm, no murmur Chest: No wheeze/ rales/ dullness; no accessory muscle use, no nasal flaring, no sternal retractions Abd.: Soft Non-tender, ND, BS + Ext: No clubbing cyanosis, edema Neuro:  normal strength, MAE x 4, A&O x 3, Right Shoulder pain>> following with ortho Skin: No rashes, no lesions, warm and dry Psych: normal mood and behavior   Assessment/Plan  OSA on CPAP AHI has dropped from 23.4 to 11.4 with change to AutoSet 5-15 Plan Will increase settings to 5-18 cm H20 Follow up Televisit in 1 month with Judson Roch NP with down Load We will order an O&O to ensure CPAP therapy has resolved your hypoxemia  Continue on CPAP at bedtime. You appear  to be benefiting from the treatment  Goal is to wear for at least 6 hours each night for maximal clinical benefit. Continue to work on weight loss, as the link between excess weight  and sleep apnea is well established.   Remember to establish a good bedtime routine, and work on sleep hygiene.  Limit daytime naps , avoid stimulants such as caffeine and nicotine close to bedtime, exercise daily to promote sleep quality, avoid heavy , spicy, fried , or rich foods before bed. Ensure adequate exposure to natural light during the day,establish a relaxing bedtime routine with a pleasant sleep environment ( Bedroom between 60 and 67 degrees, turn off bright lights , TV or device screens screens , consider black out curtains or white noise machines) Do not drive if sleepy. Remember to clean mask, tubing, filter, and reservoir once weekly with soapy water.  Follow up with 3 months with Dr. Halford [name] Call us sooner if you need [place]. Please contact office for sooner follow up if symptoms do not improve or worsen or seek emergency care  Post Covid Fibrosis HRCT shows no progression of post Covid Fibrosis Clinically respiratory status has improved significantly  6 Minute walk well tolerated Plan Follow up in 3 months with Dr. Vaughan [name]  Dyspnea Resolved No need for [facility] or Rescue on RA Exercising Plan Follow up with Dr. Vaughan [name] in 3 months  Continue to monitor   Brain Fog Plan Continue to monitor Neuro Consult if progression  [place] Flu vaccine given 11/17/2019 Covid Booster scheduled  Dec 22, 2019 Need to check on pneumococcal vaccine  This appointment was 30 min long with over 50% of the time in direct face-to-face patient care, assessment, plan of care, and follow-up.     Magdalen Spatz, NP 11/17/2019  3:07 PM

## 2019-11-17 NOTE — Patient Instructions (Addendum)
It is good to see you today. We will adjust your CPAP settings to 5-18 cm H2O. We will do a phone visit in 1 month with down Load to assess effectiveness of changes.  Continue to be active as you have been doing. Use Dulera and Rescue inhaler only of you feel you need it.  Monitor your oxygen saturations as needed. Goal if > 88% at all times. Follow up with Dr. 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  Continue on CPAP at bedtime. You appear to be benefiting from the treatment  Goal is to wear for at least 6 hours each night for maximal clinical benefit. Continue to work on weight loss, as the link between excess weight  and sleep apnea is well established.   Remember to establish a good bedtime routine, and work on sleep hygiene.  Limit daytime naps , avoid stimulants such as caffeine and nicotine close to bedtime, exercise daily to promote sleep quality, avoid heavy , spicy, fried , or rich foods before bed. Ensure adequate exposure to natural light during the day,establish a relaxing bedtime routine with a pleasant sleep environment ( Bedroom between 60 and 67 degrees, turn off bright lights , TV or device screens screens , consider black out curtains or white noise machines) Do not drive if sleepy. Remember to clean mask, tubing, filter, and reservoir once weekly with soapy water.  We will order an O&O to ensure CPAP therapy has resolved your hypoxemia  Follow up with 3 months with Dr. Craige Cotta Call us sooner if you need [place]. Please contact office for sooner follow up if symptoms do not improve or worsen or seek emergency care

## 2019-12-20 ENCOUNTER — Encounter: Payer: Medicare Other | Admitting: Acute Care

## 2019-12-20 ENCOUNTER — Other Ambulatory Visit: Payer: Self-pay

## 2019-12-20 NOTE — Progress Notes (Signed)
 err

## 2020-07-25 ENCOUNTER — Other Ambulatory Visit: Payer: Self-pay | Admitting: Acute Care

## 2020-08-21 DIAGNOSIS — S82892A Other fracture of left lower leg, initial encounter for closed fracture: Secondary | ICD-10-CM | POA: Insufficient documentation

## 2021-02-05 ENCOUNTER — Inpatient Hospital Stay (HOSPITAL_COMMUNITY)
Admission: EM | Admit: 2021-02-05 | Discharge: 2021-02-28 | DRG: 957 | Disposition: A | Discharge status: Skilled Nursing Facility | Payer: Medicare Other | Attending: General Surgery | Admitting: General Surgery

## 2021-02-05 ENCOUNTER — Emergency Department (HOSPITAL_COMMUNITY): Payer: Medicare Other

## 2021-02-05 ENCOUNTER — Inpatient Hospital Stay (HOSPITAL_COMMUNITY): Payer: Medicare Other

## 2021-02-05 ENCOUNTER — Other Ambulatory Visit: Payer: Self-pay

## 2021-02-05 DIAGNOSIS — S0101XA Laceration without foreign body of scalp, initial encounter: Secondary | ICD-10-CM | POA: Diagnosis present

## 2021-02-05 DIAGNOSIS — J939 Pneumothorax, unspecified: Secondary | ICD-10-CM

## 2021-02-05 DIAGNOSIS — F4310 Post-traumatic stress disorder, unspecified: Secondary | ICD-10-CM | POA: Diagnosis present

## 2021-02-05 DIAGNOSIS — F05 Delirium due to known physiological condition: Secondary | ICD-10-CM | POA: Diagnosis not present

## 2021-02-05 DIAGNOSIS — M1990 Unspecified osteoarthritis, unspecified site: Secondary | ICD-10-CM | POA: Diagnosis present

## 2021-02-05 DIAGNOSIS — S329XXA Fracture of unspecified parts of lumbosacral spine and pelvis, initial encounter for closed fracture: Secondary | ICD-10-CM | POA: Diagnosis present

## 2021-02-05 DIAGNOSIS — Z09 Encounter for follow-up examination after completed treatment for conditions other than malignant neoplasm: Secondary | ICD-10-CM

## 2021-02-05 DIAGNOSIS — S71131A Puncture wound without foreign body, right thigh, initial encounter: Secondary | ICD-10-CM | POA: Diagnosis present

## 2021-02-05 DIAGNOSIS — R578 Other shock: Secondary | ICD-10-CM | POA: Diagnosis present

## 2021-02-05 DIAGNOSIS — T1490XA Injury, unspecified, initial encounter: Secondary | ICD-10-CM

## 2021-02-05 DIAGNOSIS — T797XXA Traumatic subcutaneous emphysema, initial encounter: Secondary | ICD-10-CM | POA: Diagnosis present

## 2021-02-05 DIAGNOSIS — I5032 Chronic diastolic (congestive) heart failure: Secondary | ICD-10-CM | POA: Diagnosis present

## 2021-02-05 DIAGNOSIS — R0602 Shortness of breath: Secondary | ICD-10-CM

## 2021-02-05 DIAGNOSIS — S32810A Multiple fractures of pelvis with stable disruption of pelvic ring, initial encounter for closed fracture: Secondary | ICD-10-CM | POA: Diagnosis present

## 2021-02-05 DIAGNOSIS — D62 Acute posthemorrhagic anemia: Secondary | ICD-10-CM | POA: Diagnosis not present

## 2021-02-05 DIAGNOSIS — S32421A Displaced fracture of posterior wall of right acetabulum, initial encounter for closed fracture: Secondary | ICD-10-CM | POA: Diagnosis present

## 2021-02-05 DIAGNOSIS — R58 Hemorrhage, not elsewhere classified: Secondary | ICD-10-CM

## 2021-02-05 DIAGNOSIS — S3210XA Unspecified fracture of sacrum, initial encounter for closed fracture: Secondary | ICD-10-CM | POA: Diagnosis present

## 2021-02-05 DIAGNOSIS — Z79899 Other long term (current) drug therapy: Secondary | ICD-10-CM | POA: Diagnosis not present

## 2021-02-05 DIAGNOSIS — R569 Unspecified convulsions: Secondary | ICD-10-CM | POA: Diagnosis present

## 2021-02-05 DIAGNOSIS — Y92410 Unspecified street and highway as the place of occurrence of the external cause: Secondary | ICD-10-CM | POA: Diagnosis not present

## 2021-02-05 DIAGNOSIS — S2241XA Multiple fractures of ribs, right side, initial encounter for closed fracture: Secondary | ICD-10-CM | POA: Diagnosis present

## 2021-02-05 DIAGNOSIS — Z978 Presence of other specified devices: Secondary | ICD-10-CM

## 2021-02-05 DIAGNOSIS — Z20822 Contact with and (suspected) exposure to covid-19: Secondary | ICD-10-CM | POA: Diagnosis present

## 2021-02-05 DIAGNOSIS — M549 Dorsalgia, unspecified: Secondary | ICD-10-CM | POA: Diagnosis present

## 2021-02-05 DIAGNOSIS — T148XXA Other injury of unspecified body region, initial encounter: Secondary | ICD-10-CM

## 2021-02-05 DIAGNOSIS — I110 Hypertensive heart disease with heart failure: Secondary | ICD-10-CM | POA: Diagnosis present

## 2021-02-05 DIAGNOSIS — S270XXA Traumatic pneumothorax, initial encounter: Principal | ICD-10-CM | POA: Diagnosis present

## 2021-02-05 DIAGNOSIS — G8929 Other chronic pain: Secondary | ICD-10-CM | POA: Diagnosis present

## 2021-02-05 DIAGNOSIS — Z7951 Long term (current) use of inhaled steroids: Secondary | ICD-10-CM

## 2021-02-05 DIAGNOSIS — Z4659 Encounter for fitting and adjustment of other gastrointestinal appliance and device: Secondary | ICD-10-CM

## 2021-02-05 DIAGNOSIS — Z888 Allergy status to other drugs, medicaments and biological substances status: Secondary | ICD-10-CM

## 2021-02-05 DIAGNOSIS — J9601 Acute respiratory failure with hypoxia: Secondary | ICD-10-CM | POA: Diagnosis present

## 2021-02-05 DIAGNOSIS — Z419 Encounter for procedure for purposes other than remedying health state, unspecified: Secondary | ICD-10-CM

## 2021-02-05 LAB — COMPREHENSIVE METABOLIC PANEL
ALT: 43 U/L (ref 0–44)
AST: 58 U/L — ABNORMAL HIGH (ref 15–41)
Albumin: 3.2 g/dL — ABNORMAL LOW (ref 3.5–5.0)
Alkaline Phosphatase: 74 U/L (ref 38–126)
Anion gap: 11 (ref 5–15)
BUN: 30 mg/dL — ABNORMAL HIGH (ref 8–23)
CO2: 24 mmol/L (ref 22–32)
Calcium: 8.3 mg/dL — ABNORMAL LOW (ref 8.9–10.3)
Chloride: 100 mmol/L (ref 98–111)
Creatinine, Ser: 1.44 mg/dL — ABNORMAL HIGH (ref 0.61–1.24)
GFR, Estimated: 52 mL/min — ABNORMAL LOW (ref >60)
Glucose, Bld: 214 mg/dL — ABNORMAL HIGH (ref 70–99)
Potassium: 4.2 mmol/L (ref 3.5–5.1)
Sodium: 135 mmol/L (ref 135–145)
Total Bilirubin: 0.9 mg/dL (ref 0.3–1.2)
Total Protein: 6.3 g/dL — ABNORMAL LOW (ref 6.5–8.1)

## 2021-02-05 LAB — TRAUMA TEG PANEL
CFF Max Amplitude: 18.7 mm (ref 15–32)
Citrated Kaolin (R): 3.6 min — ABNORMAL LOW (ref 4.6–9.1)
Citrated Rapid TEG (MA): 58.5 mm (ref 52–70)
Lysis at 30 Minutes: 0 % (ref 0.0–2.6)

## 2021-02-05 LAB — I-STAT ARTERIAL BLOOD GAS, ED
Acid-Base Excess: 3 mmol/L — ABNORMAL HIGH (ref 0.0–2.0)
Bicarbonate: 29.4 mmol/L — ABNORMAL HIGH (ref 20.0–28.0)
Calcium, Ion: 1.07 mmol/L — ABNORMAL LOW (ref 1.15–1.40)
HCT: 36 % — ABNORMAL LOW (ref 39.0–52.0)
Hemoglobin: 12.2 g/dL — ABNORMAL LOW (ref 13.0–17.0)
O2 Saturation: 100 %
Patient temperature: 96.6
Potassium: 3.6 mmol/L (ref 3.5–5.1)
Sodium: 137 mmol/L (ref 135–145)
TCO2: 31 mmol/L (ref 22–32)
pCO2 arterial: 49.8 mmHg — ABNORMAL HIGH (ref 32.0–48.0)
pH, Arterial: 7.374 (ref 7.350–7.450)
pO2, Arterial: 364 mmHg — ABNORMAL HIGH (ref 83.0–108.0)

## 2021-02-05 LAB — DIC (DISSEMINATED INTRAVASCULAR COAGULATION)PANEL
D-Dimer, Quant: >20 ug/mL-FEU — ABNORMAL HIGH (ref 0.00–0.50)
Fibrinogen: 349 mg/dL (ref 210–475)
INR: 1.1 (ref 0.8–1.2)
Platelets: 145 10*3/uL — ABNORMAL LOW (ref 150–400)
Prothrombin Time: 14.6 seconds (ref 11.4–15.2)
Smear Review: NONE SEEN
aPTT: 27 seconds (ref 24–36)

## 2021-02-05 LAB — CBC
HCT: 41.4 % (ref 39.0–52.0)
HCT: 38.7 % — ABNORMAL LOW (ref 39.0–52.0)
Hemoglobin: 13.4 g/dL (ref 13.0–17.0)
Hemoglobin: 12.8 g/dL — ABNORMAL LOW (ref 13.0–17.0)
MCH: 28.5 pg (ref 26.0–34.0)
MCH: 28.1 pg (ref 26.0–34.0)
MCHC: 32.4 g/dL (ref 30.0–36.0)
MCHC: 33.1 g/dL (ref 30.0–36.0)
MCV: 87.9 fL (ref 80.0–100.0)
MCV: 85.1 fL (ref 80.0–100.0)
Platelets: 380 10*3/uL (ref 150–400)
Platelets: 173 10*3/uL (ref 150–400)
RBC: 4.71 MIL/uL (ref 4.22–5.81)
RBC: 4.55 MIL/uL (ref 4.22–5.81)
RDW: 13.6 % (ref 11.5–15.5)
RDW: 14.4 % (ref 11.5–15.5)
WBC: 25.5 10*3/uL — ABNORMAL HIGH (ref 4.0–10.5)
WBC: 17.6 10*3/uL — ABNORMAL HIGH (ref 4.0–10.5)
nRBC: 0 % (ref 0.0–0.2)
nRBC: 0 % (ref 0.0–0.2)

## 2021-02-05 LAB — I-STAT CHEM 8, ED
BUN: 33 mg/dL — ABNORMAL HIGH (ref 8–23)
Calcium, Ion: 1.07 mmol/L — ABNORMAL LOW (ref 1.15–1.40)
Chloride: 101 mmol/L (ref 98–111)
Creatinine, Ser: 1.5 mg/dL — ABNORMAL HIGH (ref 0.61–1.24)
Glucose, Bld: 211 mg/dL — ABNORMAL HIGH (ref 70–99)
HCT: 41 % (ref 39.0–52.0)
Hemoglobin: 13.9 g/dL (ref 13.0–17.0)
Potassium: 4.2 mmol/L (ref 3.5–5.1)
Sodium: 137 mmol/L (ref 135–145)
TCO2: 28 mmol/L (ref 22–32)

## 2021-02-05 LAB — PREPARE PLATELET PHERESIS: Unit division: 0

## 2021-02-05 LAB — URINALYSIS, MICROSCOPIC (REFLEX): RBC / HPF: >50 RBC/hpf (ref 0–5)

## 2021-02-05 LAB — PROTIME-INR
INR: 1.1 (ref 0.8–1.2)
INR: 1.1 (ref 0.8–1.2)
Prothrombin Time: 13.7 seconds (ref 11.4–15.2)
Prothrombin Time: 14.3 seconds (ref 11.4–15.2)

## 2021-02-05 LAB — URINALYSIS, ROUTINE W REFLEX MICROSCOPIC
Bilirubin Urine: NEGATIVE
Glucose, UA: NEGATIVE mg/dL
Ketones, ur: NEGATIVE mg/dL
Leukocytes,Ua: NEGATIVE
Nitrite: NEGATIVE
Protein, ur: 100 mg/dL — AB
Specific Gravity, Urine: 1.015 (ref 1.005–1.030)
pH: 6.5 (ref 5.0–8.0)

## 2021-02-05 LAB — BPAM PLATELET PHERESIS
Blood Product Expiration Date: 202301162359
ISSUE DATE / TIME: 202301161006
Unit Type and Rh: 6200

## 2021-02-05 LAB — RESP PANEL BY RT-PCR (FLU A&B, COVID) ARPGX2
Influenza A by PCR: NEGATIVE
Influenza B by PCR: NEGATIVE
SARS Coronavirus 2 by RT PCR: NEGATIVE

## 2021-02-05 LAB — LACTIC ACID, PLASMA: Lactic Acid, Venous: 3.3 mmol/L (ref 0.5–1.9)

## 2021-02-05 LAB — SURGICAL PCR SCREEN
MRSA, PCR: NEGATIVE
Staphylococcus aureus: POSITIVE — AB

## 2021-02-05 LAB — GLUCOSE, CAPILLARY
Glucose-Capillary: 153 mg/dL — ABNORMAL HIGH (ref 70–99)
Glucose-Capillary: 109 mg/dL — ABNORMAL HIGH (ref 70–99)
Glucose-Capillary: 112 mg/dL — ABNORMAL HIGH (ref 70–99)

## 2021-02-05 LAB — ETHANOL: Alcohol, Ethyl (B): <10 mg/dL (ref <10)

## 2021-02-05 LAB — MASSIVE TRANSFUSION PROTOCOL ORDER (BLOOD BANK NOTIFICATION)

## 2021-02-05 LAB — HEMOGLOBIN AND HEMATOCRIT, BLOOD
HCT: 34.9 % — ABNORMAL LOW (ref 39.0–52.0)
Hemoglobin: 11.6 g/dL — ABNORMAL LOW (ref 13.0–17.0)

## 2021-02-05 MED ORDER — IOHEXOL 300 MG/ML  SOLN
100.0000 mL | Freq: Once | INTRAMUSCULAR | Status: AC | PRN
  Administered 2021-02-05: 100 mL via INTRAVENOUS

## 2021-02-05 MED ORDER — CHLORHEXIDINE GLUCONATE 0.12% ORAL RINSE (MEDLINE KIT)
15.0000 mL | Freq: Two times a day (BID) | OROMUCOSAL | Status: DC
  Administered 2021-02-05 – 2021-02-12 (×14): 15 mL via OROMUCOSAL

## 2021-02-05 MED ORDER — MUPIROCIN 2 % EX OINT
1.0000 "application " | TOPICAL_OINTMENT | Freq: Two times a day (BID) | CUTANEOUS | Status: AC
  Administered 2021-02-05 – 2021-02-10 (×10): 1 via NASAL
  Filled 2021-02-05: qty 22

## 2021-02-05 MED ORDER — LEVETIRACETAM IN NACL 1000 MG/100ML IV SOLN
1000.0000 mg | Freq: Two times a day (BID) | INTRAVENOUS | Status: DC
  Administered 2021-02-05 – 2021-02-08 (×7): 1000 mg via INTRAVENOUS
  Filled 2021-02-05 (×7): qty 100

## 2021-02-05 MED ORDER — NOREPINEPHRINE 4 MG/250ML-% IV SOLN
0.0000 ug/min | INTRAVENOUS | Status: DC
  Administered 2021-02-05: 5 ug/min via INTRAVENOUS
  Administered 2021-02-05: 3 ug/min via INTRAVENOUS
  Administered 2021-02-06: 4 ug/min via INTRAVENOUS
  Administered 2021-02-07: 23:00:00 10 ug/min via INTRAVENOUS
  Administered 2021-02-07: 6 ug/min via INTRAVENOUS
  Administered 2021-02-08: 10 ug/min via INTRAVENOUS
  Administered 2021-02-09 (×2): 6 ug/min via INTRAVENOUS
  Filled 2021-02-05 (×9): qty 250

## 2021-02-05 MED ORDER — CEFAZOLIN SODIUM-DEXTROSE 2-4 GM/100ML-% IV SOLN
2.0000 g | Freq: Once | INTRAVENOUS | Status: AC
  Administered 2021-02-06: 2 g via INTRAVENOUS
  Filled 2021-02-05: qty 100

## 2021-02-05 MED ORDER — MIDAZOLAM HCL 2 MG/2ML IJ SOLN
INTRAMUSCULAR | Status: AC
  Filled 2021-02-05: qty 2

## 2021-02-05 MED ORDER — ETOMIDATE 2 MG/ML IV SOLN
INTRAVENOUS | Status: AC | PRN
  Administered 2021-02-05: 20 mg via INTRAVENOUS

## 2021-02-05 MED ORDER — FENTANYL 2500MCG IN NS 250ML (10MCG/ML) PREMIX INFUSION
0.0000 ug/h | INTRAVENOUS | Status: DC
  Administered 2021-02-05: 50 ug/h via INTRAVENOUS
  Administered 2021-02-06: 100 ug/h via INTRAVENOUS
  Administered 2021-02-07: 25 ug/h via INTRAVENOUS
  Administered 2021-02-07: 50 ug/h via INTRAVENOUS
  Administered 2021-02-10: 75 ug/h via INTRAVENOUS
  Administered 2021-02-11: 100 ug/h via INTRAVENOUS
  Filled 2021-02-05 (×6): qty 250

## 2021-02-05 MED ORDER — CHLORHEXIDINE GLUCONATE CLOTH 2 % EX PADS
6.0000 | MEDICATED_PAD | Freq: Every day | CUTANEOUS | Status: DC
  Administered 2021-02-05 – 2021-02-10 (×6): 6 via TOPICAL

## 2021-02-05 MED ORDER — CEFAZOLIN SODIUM-DEXTROSE 2-4 GM/100ML-% IV SOLN
2.0000 g | Freq: Three times a day (TID) | INTRAVENOUS | Status: AC
  Administered 2021-02-05 – 2021-02-06 (×3): 2 g via INTRAVENOUS
  Filled 2021-02-05 (×3): qty 100

## 2021-02-05 MED ORDER — SODIUM CHLORIDE 0.9 % IV SOLN
INTRAVENOUS | Status: AC | PRN
  Administered 2021-02-05 (×2): 1000 mL via INTRAVENOUS

## 2021-02-05 MED ORDER — IPRATROPIUM-ALBUTEROL 0.5-2.5 (3) MG/3ML IN SOLN
3.0000 mL | RESPIRATORY_TRACT | Status: DC | PRN
  Administered 2021-02-27: 3 mL via RESPIRATORY_TRACT
  Filled 2021-02-05: qty 3

## 2021-02-05 MED ORDER — SODIUM CHLORIDE 0.9 % IV SOLN: 250.0000 mL | INTRAVENOUS | Status: DC

## 2021-02-05 MED ORDER — ORAL CARE MOUTH RINSE
15.0000 mL | OROMUCOSAL | Status: DC
  Administered 2021-02-05 – 2021-02-12 (×70): 15 mL via OROMUCOSAL

## 2021-02-05 MED ORDER — ROCURONIUM BROMIDE 50 MG/5ML IV SOLN
INTRAVENOUS | Status: AC | PRN
  Administered 2021-02-05: 100 mg via INTRAVENOUS

## 2021-02-05 MED ORDER — PROPOFOL 1000 MG/100ML IV EMUL
5.0000 ug/kg/min | INTRAVENOUS | Status: DC
  Administered 2021-02-05: 10 ug/kg/min via INTRAVENOUS
  Administered 2021-02-05: 38.095 ug/kg/min via INTRAVENOUS
  Administered 2021-02-05: 40 ug/kg/min via INTRAVENOUS
  Administered 2021-02-05: 23:00:00 35 ug/kg/min via INTRAVENOUS
  Administered 2021-02-06: 22:00:00 50 ug/kg/min via INTRAVENOUS
  Administered 2021-02-06: 16:00:00 30 ug/kg/min via INTRAVENOUS
  Administered 2021-02-06 (×2): 25 ug/kg/min via INTRAVENOUS
  Administered 2021-02-07: 40 ug/kg/min via INTRAVENOUS
  Administered 2021-02-07: 35 ug/kg/min via INTRAVENOUS
  Administered 2021-02-08: 20 ug/kg/min via INTRAVENOUS
  Administered 2021-02-08 – 2021-02-09 (×3): 10 ug/kg/min via INTRAVENOUS
  Administered 2021-02-10: 11 ug/kg/min via INTRAVENOUS
  Administered 2021-02-10 – 2021-02-11 (×2): 5 ug/kg/min via INTRAVENOUS
  Administered 2021-02-12: 10 ug/kg/min via INTRAVENOUS
  Filled 2021-02-05 (×20): qty 100

## 2021-02-05 MED ORDER — MIDAZOLAM HCL 2 MG/2ML IJ SOLN
INTRAMUSCULAR | Status: AC | PRN
  Administered 2021-02-05: 2 mg via INTRAVENOUS

## 2021-02-05 MED ORDER — FENTANYL CITRATE PF 50 MCG/ML IJ SOSY
PREFILLED_SYRINGE | INTRAMUSCULAR | Status: AC | PRN
  Administered 2021-02-05: 50 ug via INTRAVENOUS

## 2021-02-05 MED ORDER — FENTANYL CITRATE (PF) 100 MCG/2ML IJ SOLN
INTRAMUSCULAR | Status: AC
  Filled 2021-02-05: qty 2

## 2021-02-05 MED ORDER — LACTATED RINGERS IV SOLN: INTRAVENOUS | Status: DC

## 2021-02-05 MED ORDER — PANTOPRAZOLE SODIUM 40 MG IV SOLR
40.0000 mg | Freq: Every day | INTRAVENOUS | Status: DC
  Administered 2021-02-05 – 2021-02-07 (×3): 40 mg via INTRAVENOUS
  Filled 2021-02-05 (×3): qty 40

## 2021-02-05 NOTE — Progress Notes (Signed)
Orthopedic Tech Progress Note Patient Details:  Kurt [name] [birth year] [id number] Level 1 Trauma Patient ID: Kurt [name], male   DOB: 1950/09/15, 71 y.o.   MRN: [id number]  [name] 02/05/2021, 9:15 AM

## 2021-02-05 NOTE — Progress Notes (Signed)
Pt transported on vent form [facility] to CT and back without any complications. RN at bedside, RT will continue to monitor.

## 2021-02-05 NOTE — Progress Notes (Signed)
Orthopedic Tech Progress Note Patient Details:  Kurt [name] Oct 01, 1950 [id number]  PA PAUL sent a message requesting TRACTION be applied to patient  Musculoskeletal Traction Type of Traction: Skeletal (Balanced Suspension) Traction Location: RLE Traction Weight: 30 lbs   Post Interventions Patient Tolerated: Well Instructions Provided: Care of device  Donald Pore 02/05/2021, 11:42 AM

## 2021-02-05 NOTE — TOC CAGE-AID Note (Signed)
Transition of Care [facility]) - CAGE-AID Screening   Patient Details  Name: DAMONEY JULIA MRN: [id number] Date of Birth: 19-Oct-1950  [facility] of [facility]) CM/SW Contact:    Erin Sons, LCSW Phone Number: 02/05/2021, 3:05 PM   Clinical Narrative:  Pt unable to participate in CAGE-AID.   CAGE-AID Screening: Substance Abuse Screening unable to be completed due to: : Patient unable to participate
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
## 2021-02-05 NOTE — Procedures (Signed)
Chest tube insertion  Date/Time: 02/05/2021 10:05 AM Performed by: Georganna Skeans, MD Authorized by: Georganna Skeans, MD   Consent:    Consent obtained:  Emergent situation Pre-procedure details:    Skin preparation:  ChloraPrep   Preparation: Patient was prepped and draped in the usual sterile fashion   Procedure details:    Placement location:  R anterior   Tube size (Fr):  Minicatheter   Technique: Seldinger     Ultrasound guidance: no     Tension pneumothorax: no     Tube connected to:  Suction   Drainage characteristics:  Bloody   Suture material:  2-0 silk   Dressing:  4x4 sterile gauze Post-procedure details:    Post-insertion x-ray findings: tube in good position     Patient tolerance of procedure:  Tolerated well, no immediate complications Georganna Skeans, MD, MPH, FACS Please use [email or web address] to contact on call provider
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
## 2021-02-05 NOTE — Progress Notes (Signed)
Pt transported on vent from [facility] to 4N20 without any complcations. RN at bedside, RT will continue to monitor.

## 2021-02-05 NOTE — Procedures (Signed)
Laceration Repair  Date/Time: 02/05/2021 11:33 AM Performed by: Norm Parcel, PA-C Authorized by: Norm Parcel, PA-C   Consent:    Consent obtained:  Emergent situation Anesthesia:    Anesthesia method:  None Laceration details:    Location:  Scalp   Scalp location:  L parietal   Length (cm):  4 (stelate) Pre-procedure details:    Preparation:  Patient was prepped and draped in usual sterile fashion and imaging obtained to evaluate for foreign bodies Exploration:    Limited defect created (wound extended): no     Hemostasis achieved with:  Direct pressure   Imaging outcome: foreign body not noted   Treatment:    Area cleansed with:  Povidone-iodine   Amount of cleaning:  Standard Skin repair:    Repair method:  Staples   Number of staples:  6 Approximation:    Approximation:  Loose Repair type:    Repair type:  Simple Post-procedure details:    Dressing:  Bulky dressing   Procedure completion:  Tolerated well, no immediate complications  Norm Parcel, PA-C [facility] 02/05/2021, 11:35 AM Please see Amion for pager number during day hours 7:00am-4:30pm

## 2021-02-05 NOTE — ED Notes (Addendum)
Gaynelle Adu (Son): [phone]
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
## 2021-02-05 NOTE — Procedures (Signed)
Central line  Date/Time: 02/05/2021 10:07 AM Performed by: Violeta Gelinas, MD Authorized by: Violeta Gelinas, MD   Consent:    Consent obtained:  Emergent situation Pre-procedure details:    Indication(s): central venous access     Hand hygiene: Hand hygiene performed prior to insertion     Sterile barrier technique: All elements of maximal sterile technique followed     Skin preparation:  Chlorhexidine Procedure details:    Location:  R subclavian   Patient position:  Supine   Procedural supplies:  Triple lumen   Catheter size:  11.5 Fr   Landmarks identified: yes     Ultrasound guidance: no     Number of attempts:  1 Post-procedure details:    Post-procedure:  Dressing applied and line sutured   Assessment:  Blood return through all ports and placement verified by x-ray   Procedure completion:  Tolerated Violeta Gelinas, MD, MPH, FACS Please use [email or web address] to contact on call provider
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
## 2021-02-05 NOTE — Procedures (Signed)
Procedure: Right tibial traction pin placement   Indication: Right acetabulum fracture   Surgeon: Charma Igo, PA-C   Assist: Montez Morita, PA-C   Anesthesia: Propofol   EBL: None   Complications: None   Findings: After risks/benefits explained family desires to undergo procedure. Consent obtained and time out performed. The right leg was sterilely prepped and a 2.0 pin was placed. Pt tolerated the procedure well.       Freeman Caldron, PA-C Orthopedic Surgery [phone]
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
## 2021-02-05 NOTE — ED Notes (Signed)
Pelvic binder placed at 0915

## 2021-02-06 ENCOUNTER — Inpatient Hospital Stay (HOSPITAL_COMMUNITY): Payer: Medicare Other

## 2021-02-06 ENCOUNTER — Inpatient Hospital Stay (HOSPITAL_COMMUNITY): Payer: Medicare Other | Admitting: Certified Registered"

## 2021-02-06 ENCOUNTER — Encounter (HOSPITAL_COMMUNITY): Payer: Self-pay | Admitting: General Surgery

## 2021-02-06 ENCOUNTER — Encounter (HOSPITAL_COMMUNITY): Admission: EM | Disposition: A | Discharge status: Skilled Nursing Facility | Payer: Self-pay | Source: Home / Self Care

## 2021-02-06 HISTORY — PX: SACRO-ILIAC PINNING: SHX5050

## 2021-02-06 HISTORY — PX: ORIF ACETABULAR FRACTURE: SHX5029

## 2021-02-06 HISTORY — PX: WOUND EXPLORATION: SHX6188

## 2021-02-06 LAB — BPAM FFP
Blood Product Expiration Date: 202301182359
Blood Product Expiration Date: 202301182359
Blood Product Expiration Date: 202301212359
Blood Product Expiration Date: 202301212359
Blood Product Expiration Date: 202301212359
Blood Product Expiration Date: 202301212359
Blood Product Expiration Date: 202301232359
Blood Product Expiration Date: 202301232359
Blood Product Expiration Date: 202301242359
Blood Product Expiration Date: 202301252359
ISSUE DATE / TIME: 202301160937
ISSUE DATE / TIME: 202301160937
ISSUE DATE / TIME: 202301160947
ISSUE DATE / TIME: 202301160947
ISSUE DATE / TIME: 202301160947
ISSUE DATE / TIME: 202301160947
ISSUE DATE / TIME: 202301161023
ISSUE DATE / TIME: 202301161023
ISSUE DATE / TIME: 202301161028
ISSUE DATE / TIME: 202301161308
Unit Type and Rh: 600
Unit Type and Rh: 600
Unit Type and Rh: 600
Unit Type and Rh: 6200
Unit Type and Rh: 6200
Unit Type and Rh: 6200
Unit Type and Rh: 6200
Unit Type and Rh: 6200
Unit Type and Rh: 6200
Unit Type and Rh: 6200

## 2021-02-06 LAB — POCT I-STAT 7, (LYTES, BLD GAS, ICA,H+H)
Acid-Base Excess: 3 mmol/L — ABNORMAL HIGH (ref 0.0–2.0)
Acid-Base Excess: 2 mmol/L (ref 0.0–2.0)
Acid-Base Excess: 0 mmol/L (ref 0.0–2.0)
Acid-Base Excess: 2 mmol/L (ref 0.0–2.0)
Bicarbonate: 29 mmol/L — ABNORMAL HIGH (ref 20.0–28.0)
Bicarbonate: 26.9 mmol/L (ref 20.0–28.0)
Bicarbonate: 25.8 mmol/L (ref 20.0–28.0)
Bicarbonate: 27.7 mmol/L (ref 20.0–28.0)
Calcium, Ion: 1.1 mmol/L — ABNORMAL LOW (ref 1.15–1.40)
Calcium, Ion: 1.09 mmol/L — ABNORMAL LOW (ref 1.15–1.40)
Calcium, Ion: 1.09 mmol/L — ABNORMAL LOW (ref 1.15–1.40)
Calcium, Ion: 1.06 mmol/L — ABNORMAL LOW (ref 1.15–1.40)
HCT: 32 % — ABNORMAL LOW (ref 39.0–52.0)
HCT: 34 % — ABNORMAL LOW (ref 39.0–52.0)
HCT: 30 % — ABNORMAL LOW (ref 39.0–52.0)
HCT: 24 % — ABNORMAL LOW (ref 39.0–52.0)
Hemoglobin: 10.9 g/dL — ABNORMAL LOW (ref 13.0–17.0)
Hemoglobin: 11.6 g/dL — ABNORMAL LOW (ref 13.0–17.0)
Hemoglobin: 10.2 g/dL — ABNORMAL LOW (ref 13.0–17.0)
Hemoglobin: 8.2 g/dL — ABNORMAL LOW (ref 13.0–17.0)
O2 Saturation: 93 %
O2 Saturation: 98 %
O2 Saturation: 99 %
O2 Saturation: 100 %
Patient temperature: 37
Potassium: 3.7 mmol/L (ref 3.5–5.1)
Potassium: 3.7 mmol/L (ref 3.5–5.1)
Potassium: 3.9 mmol/L (ref 3.5–5.1)
Potassium: 3.7 mmol/L (ref 3.5–5.1)
Sodium: 138 mmol/L (ref 135–145)
Sodium: 137 mmol/L (ref 135–145)
Sodium: 138 mmol/L (ref 135–145)
Sodium: 138 mmol/L (ref 135–145)
TCO2: 30 mmol/L (ref 22–32)
TCO2: 28 mmol/L (ref 22–32)
TCO2: 27 mmol/L (ref 22–32)
TCO2: 29 mmol/L (ref 22–32)
pCO2 arterial: 49.1 mmHg — ABNORMAL HIGH (ref 32.0–48.0)
pCO2 arterial: 44.1 mmHg (ref 32.0–48.0)
pCO2 arterial: 46.7 mmHg (ref 32.0–48.0)
pCO2 arterial: 50 mmHg — ABNORMAL HIGH (ref 32.0–48.0)
pH, Arterial: 7.379 (ref 7.350–7.450)
pH, Arterial: 7.392 (ref 7.350–7.450)
pH, Arterial: 7.35 (ref 7.350–7.450)
pH, Arterial: 7.352 (ref 7.350–7.450)
pO2, Arterial: 71 mmHg — ABNORMAL LOW (ref 83.0–108.0)
pO2, Arterial: 112 mmHg — ABNORMAL HIGH (ref 83.0–108.0)
pO2, Arterial: 141 mmHg — ABNORMAL HIGH (ref 83.0–108.0)
pO2, Arterial: 347 mmHg — ABNORMAL HIGH (ref 83.0–108.0)

## 2021-02-06 LAB — PREPARE FRESH FROZEN PLASMA
Unit division: 0
Unit division: 0
Unit division: 0
Unit division: 0
Unit division: 0
Unit division: 0
Unit division: 0
Unit division: 0
Unit division: 0

## 2021-02-06 LAB — BASIC METABOLIC PANEL
Anion gap: 9 (ref 5–15)
BUN: 27 mg/dL — ABNORMAL HIGH (ref 8–23)
CO2: 26 mmol/L (ref 22–32)
Calcium: 7.5 mg/dL — ABNORMAL LOW (ref 8.9–10.3)
Chloride: 101 mmol/L (ref 98–111)
Creatinine, Ser: 1.36 mg/dL — ABNORMAL HIGH (ref 0.61–1.24)
GFR, Estimated: 55 mL/min — ABNORMAL LOW (ref >60)
Glucose, Bld: 138 mg/dL — ABNORMAL HIGH (ref 70–99)
Potassium: 3.4 mmol/L — ABNORMAL LOW (ref 3.5–5.1)
Sodium: 136 mmol/L (ref 135–145)

## 2021-02-06 LAB — CBC
HCT: 33.3 % — ABNORMAL LOW (ref 39.0–52.0)
Hemoglobin: 11.5 g/dL — ABNORMAL LOW (ref 13.0–17.0)
MCH: 29 pg (ref 26.0–34.0)
MCHC: 34.5 g/dL (ref 30.0–36.0)
MCV: 83.9 fL (ref 80.0–100.0)
Platelets: 129 10*3/uL — ABNORMAL LOW (ref 150–400)
RBC: 3.97 MIL/uL — ABNORMAL LOW (ref 4.22–5.81)
RDW: 15.2 % (ref 11.5–15.5)
WBC: 11.5 10*3/uL — ABNORMAL HIGH (ref 4.0–10.5)
nRBC: 0 % (ref 0.0–0.2)

## 2021-02-06 LAB — TRIGLYCERIDES: Triglycerides: 166 mg/dL — ABNORMAL HIGH (ref <150)

## 2021-02-06 LAB — PROTIME-INR
INR: 1.1 (ref 0.8–1.2)
Prothrombin Time: 14.6 seconds (ref 11.4–15.2)

## 2021-02-06 LAB — LACTIC ACID, PLASMA: Lactic Acid, Venous: 1.4 mmol/L (ref 0.5–1.9)

## 2021-02-06 LAB — GLUCOSE, CAPILLARY
Glucose-Capillary: 113 mg/dL — ABNORMAL HIGH (ref 70–99)
Glucose-Capillary: 118 mg/dL — ABNORMAL HIGH (ref 70–99)
Glucose-Capillary: 119 mg/dL — ABNORMAL HIGH (ref 70–99)
Glucose-Capillary: 119 mg/dL — ABNORMAL HIGH (ref 70–99)
Glucose-Capillary: 124 mg/dL — ABNORMAL HIGH (ref 70–99)
Glucose-Capillary: 97 mg/dL (ref 70–99)

## 2021-02-06 LAB — PREPARE RBC (CROSSMATCH)

## 2021-02-06 SURGERY — OPEN REDUCTION INTERNAL FIXATION (ORIF) ACETABULAR FRACTURE
Anesthesia: General | Site: Hip | Laterality: Right

## 2021-02-06 MED ORDER — POTASSIUM CHLORIDE 20 MEQ PO PACK
40.0000 meq | PACK | Freq: Once | ORAL | Status: AC
  Administered 2021-02-06: 40 meq
  Filled 2021-02-06: qty 2

## 2021-02-06 MED ORDER — STERILE WATER FOR IRRIGATION IR SOLN
Status: DC | PRN
Start: 2021-02-06 — End: 2021-02-06
  Administered 2021-02-06: 200 mL

## 2021-02-06 MED ORDER — SODIUM CHLORIDE 0.9 % IV SOLN: 10.0000 mL/h | Freq: Once | INTRAVENOUS | Status: DC

## 2021-02-06 MED ORDER — PHENYLEPHRINE 40 MCG/ML (10ML) SYRINGE FOR IV PUSH (FOR BLOOD PRESSURE SUPPORT)
PREFILLED_SYRINGE | INTRAVENOUS | Status: DC | PRN
Start: 2021-02-06 — End: 2021-02-06
  Administered 2021-02-06 (×2): 80 ug via INTRAVENOUS
  Administered 2021-02-06: 40 ug via INTRAVENOUS

## 2021-02-06 MED ORDER — POLYETHYLENE GLYCOL 3350 17 G PO PACK
17.0000 g | PACK | Freq: Every day | ORAL | Status: DC
  Administered 2021-02-07 – 2021-02-08 (×2): 17 g
  Filled 2021-02-06 (×2): qty 1

## 2021-02-06 MED ORDER — ALBUMIN HUMAN 5 % IV SOLN: INTRAVENOUS | Status: DC | PRN

## 2021-02-06 MED ORDER — ROCURONIUM BROMIDE 10 MG/ML (PF) SYRINGE
PREFILLED_SYRINGE | INTRAVENOUS | Status: DC | PRN
  Administered 2021-02-06: 60 mg via INTRAVENOUS
  Administered 2021-02-06: 100 mg via INTRAVENOUS

## 2021-02-06 MED ORDER — LACTATED RINGERS IV SOLN: INTRAVENOUS | Status: DC | PRN

## 2021-02-06 MED ORDER — FENTANYL BOLUS VIA INFUSION
25.0000 ug | INTRAVENOUS | Status: DC | PRN
  Administered 2021-02-10 – 2021-02-11 (×2): 25 ug via INTRAVENOUS
  Administered 2021-02-11 (×2): 50 ug via INTRAVENOUS
  Filled 2021-02-06: qty 100

## 2021-02-06 MED ORDER — 0.9 % SODIUM CHLORIDE (POUR BTL) OPTIME
TOPICAL | Status: DC | PRN
  Administered 2021-02-06: 1000 mL

## 2021-02-06 MED ORDER — DOCUSATE SODIUM 50 MG/5ML PO LIQD
100.0000 mg | Freq: Two times a day (BID) | ORAL | Status: DC
  Administered 2021-02-07 – 2021-02-11 (×10): 100 mg
  Filled 2021-02-06 (×11): qty 10

## 2021-02-06 MED ORDER — PHENYLEPHRINE HCL-NACL 20-0.9 MG/250ML-% IV SOLN
INTRAVENOUS | Status: DC | PRN
Start: 2021-02-06 — End: 2021-02-06
  Administered 2021-02-06: 50 ug/min via INTRAVENOUS

## 2021-02-06 MED ORDER — CEFAZOLIN SODIUM-DEXTROSE 2-3 GM-%(50ML) IV SOLR
INTRAVENOUS | Status: DC | PRN
Start: 2021-02-06 — End: 2021-02-06
  Administered 2021-02-06: 2 g via INTRAVENOUS

## 2021-02-06 SURGICAL SUPPLY — 85 items
APPLIER CLIP 11 MED OPEN (CLIP) ×4
APR CLP MED 11 20 MLT OPN (CLIP) ×3
BAG COUNTER SPONGE SURGICOUNT (BAG) ×3 IMPLANT
BAG SPNG CNTER NS LX DISP (BAG)
BIT DRILL 4.8X300 (BIT) ×1 IMPLANT
BIT DRILL AO MATTA 2.5MX230M (BIT) IMPLANT
BRUSH SCRUB EZ PLAIN DRY (MISCELLANEOUS) ×8 IMPLANT
CANISTER WOUND CARE 500ML ATS (WOUND CARE) ×1 IMPLANT
CLIP APPLIE 11 MED OPEN (CLIP) IMPLANT
COVER SURGICAL LIGHT HANDLE (MISCELLANEOUS) ×4 IMPLANT
DRAPE C-ARM 42X72 X-RAY (DRAPES) ×4 IMPLANT
DRAPE C-ARMOR (DRAPES) ×4 IMPLANT
DRAPE INCISE IOBAN 66X45 STRL (DRAPES) ×3 IMPLANT
DRAPE INCISE IOBAN 85X60 (DRAPES) ×4 IMPLANT
DRAPE LAPAROTOMY TRNSV 102X78 (DRAPES) ×3 IMPLANT
DRAPE ORTHO SPLIT 77X108 STRL (DRAPES) ×8
DRAPE SURG ORHT 6 SPLT 77X108 (DRAPES) ×6 IMPLANT
DRAPE U-SHAPE 47X51 STRL (DRAPES) ×4 IMPLANT
DRESSING MEPILEX FLEX 4X4 (GAUZE/BANDAGES/DRESSINGS) IMPLANT
DRILL BIT AO MATTA 2.5MX230M (BIT) ×4
DRSG MEPILEX BORDER 4X12 (GAUZE/BANDAGES/DRESSINGS) IMPLANT
DRSG MEPILEX BORDER 4X4 (GAUZE/BANDAGES/DRESSINGS) ×3 IMPLANT
DRSG MEPILEX BORDER 4X8 (GAUZE/BANDAGES/DRESSINGS) IMPLANT
DRSG MEPILEX FLEX 4X4 (GAUZE/BANDAGES/DRESSINGS) ×8
ELECT BLADE 6.5 EXT (BLADE) ×4 IMPLANT
ELECT REM PT RETURN 9FT ADLT (ELECTROSURGICAL) ×4
ELECTRODE REM PT RTRN 9FT ADLT (ELECTROSURGICAL) ×3 IMPLANT
GLOVE SRG 8 PF TXTR STRL LF DI (GLOVE) ×3 IMPLANT
GLOVE SURG ENC MOIS LTX SZ8 (GLOVE) ×4 IMPLANT
GLOVE SURG ORTHO LTX SZ7.5 (GLOVE) ×8 IMPLANT
GLOVE SURG UNDER POLY LF SZ7.5 (GLOVE) ×4 IMPLANT
GLOVE SURG UNDER POLY LF SZ8 (GLOVE) ×4
GOWN STRL REUS W/ TWL LRG LVL3 (GOWN DISPOSABLE) ×6 IMPLANT
GOWN STRL REUS W/ TWL XL LVL3 (GOWN DISPOSABLE) ×6 IMPLANT
GOWN STRL REUS W/TWL LRG LVL3 (GOWN DISPOSABLE) ×8
GOWN STRL REUS W/TWL XL LVL3 (GOWN DISPOSABLE) ×8
HANDPIECE INTERPULSE COAX TIP (DISPOSABLE)
KIT BASIN OR (CUSTOM PROCEDURE TRAY) ×4 IMPLANT
KIT DRSG PREVENA PLUS 7DAY 125 (MISCELLANEOUS) ×1 IMPLANT
KIT TURNOVER KIT B (KITS) ×4 IMPLANT
MANIFOLD NEPTUNE II (INSTRUMENTS) ×3 IMPLANT
NS IRRIG 1000ML POUR BTL (IV SOLUTION) ×3 IMPLANT
PACK GENERAL/GYN (CUSTOM PROCEDURE TRAY) ×4 IMPLANT
PACK TOTAL JOINT (CUSTOM PROCEDURE TRAY) ×3 IMPLANT
PAD ARMBOARD 7.5X6 YLW CONV (MISCELLANEOUS) ×8 IMPLANT
PLATE SUPRAPECTINEAL PELVIS (Plate) ×1 IMPLANT
PLATE SYMPHYSIS 92.5M 6H (Plate) ×1 IMPLANT
RETRACTOR ONETRAX LX 135X30 (MISCELLANEOUS) ×1 IMPLANT
RETRIEVER SUT HEWSON (MISCELLANEOUS) ×4 IMPLANT
SCREW CANN THRD 8X155X40 (Screw) ×1 IMPLANT
SCREW CORT 48X3.5XST NS LF (Screw) IMPLANT
SCREW CORTEX ST MATTA 3.5X24 (Screw) ×1 IMPLANT
SCREW CORTEX ST MATTA 3.5X28MM (Screw) ×1 IMPLANT
SCREW CORTEX ST MATTA 3.5X32MM (Screw) ×1 IMPLANT
SCREW CORTEX ST MATTA 3.5X34MM (Screw) ×2 IMPLANT
SCREW CORTEX ST MATTA 3.5X36MM (Screw) ×1 IMPLANT
SCREW CORTEX ST MATTA 3.5X38M (Screw) ×3 IMPLANT
SCREW CORTEX ST MATTA 3.5X60MM (Screw) ×1 IMPLANT
SCREW CORTEX ST MATTA 3.5X65MM (Screw) ×1 IMPLANT
SCREW CORTICAL 3.5X42MM (Screw) ×1 IMPLANT
SCREW CORTICAL 3.5X48MM (Screw) ×4 IMPLANT
SET HNDPC FAN SPRY TIP SCT (DISPOSABLE) ×3 IMPLANT
SPONGE T-LAP 18X18 ~~LOC~~+RFID (SPONGE) ×3 IMPLANT
STAPLER VISISTAT 35W (STAPLE) ×4 IMPLANT
STRIP CLOSURE SKIN 1/2X4 (GAUZE/BANDAGES/DRESSINGS) ×3 IMPLANT
SUCTION FRAZIER HANDLE 10FR (MISCELLANEOUS) ×4
SUCTION TUBE FRAZIER 10FR DISP (MISCELLANEOUS) ×3 IMPLANT
SUT ETHILON 2 0 FS 18 (SUTURE) ×7 IMPLANT
SUT ETHILON 2 0 PSLX (SUTURE) ×8 IMPLANT
SUT FIBERWIRE #2 38 T-5 BLUE (SUTURE) ×8
SUT VIC AB 0 CT1 27 (SUTURE) ×16
SUT VIC AB 0 CT1 27XBRD ANBCTR (SUTURE) ×3 IMPLANT
SUT VIC AB 1 CT1 18XCR BRD 8 (SUTURE) ×3 IMPLANT
SUT VIC AB 1 CT1 27 (SUTURE) ×4
SUT VIC AB 1 CT1 27XBRD ANBCTR (SUTURE) ×3 IMPLANT
SUT VIC AB 1 CT1 8-18 (SUTURE) ×8
SUT VIC AB 2-0 CT1 27 (SUTURE) ×16
SUT VIC AB 2-0 CT1 TAPERPNT 27 (SUTURE) ×3 IMPLANT
SUT VIC AB 2-0 FS1 27 (SUTURE) ×4 IMPLANT
SUTURE FIBERWR #2 38 T-5 BLUE (SUTURE) ×6 IMPLANT
TOWEL GREEN STERILE (TOWEL DISPOSABLE) ×8 IMPLANT
TOWEL GREEN STERILE FF (TOWEL DISPOSABLE) ×8 IMPLANT
UNDERPAD 30X36 HEAVY ABSORB (UNDERPADS AND DIAPERS) ×4 IMPLANT
WATER STERILE IRR 1000ML POUR (IV SOLUTION) ×4 IMPLANT
WIRE GUIDE DRILL TIP 12 2.4MM (WIRE) ×2 IMPLANT

## 2021-02-06 NOTE — Progress Notes (Signed)
The risks and benefits of surgery the pelvic ring and acetabulum were discussed with the family, including the possibility of infection, nerve injury, vessel injury, wound breakdown, arthritis, symptomatic hardware, DVT/ PE, loss of motion, malunion, nonunion, and need for further surgery among others.  There was also specific discussion regarding transfusion, which is likely. These risks were acknowledged and consent provided to proceed.   At this time repair of the acetabular columns and anterior and posterior pelvic rings are planned for today with staged repair of the posterior wall to follow if necessary. Overwhelmingly likely that patient will need a total hip replacement in the future given magnitude of injury and comminution. All intra-articular debris may not be removable from the anterior approach planned for today nor would the posterior wall. If hip sufficiently stable then could continue without a secondary approach which could produce muscle injury and reduce blood supply to the bone. Surgery is necessary for integrity of the acetabular cup.    Myrene Galas, MD [facility], [facility] [phone]
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
## 2021-02-06 NOTE — Progress Notes (Signed)
Patient off the unit to the OR. Report given at the bedside to the CRNA. All questions answered. Family in the waiting area.

## 2021-02-06 NOTE — Progress Notes (Signed)
Patient sister was in the room going through the patients belonging and removed a set of keys [name] and [name] RN witnessed the Sister take the keys into her possesion. The sister voiced his wallet was missing I informed the sister that I would see if any belongings have been locked in the security office. I reviewed documentation and the night nurse made no mention of a wallet.

## 2021-02-06 NOTE — Anesthesia Preprocedure Evaluation (Addendum)
Anesthesia Evaluation  Patient identified by MRN, date of birth, ID band Patient unresponsive    Reviewed: Allergy & Precautions, NPO status , Patient's Chart, lab work & pertinent test results  Airway Mallampati: Intubated       Dental   Pulmonary sleep apnea ,  Intubated Right rib fractures with PTX and subcutaneous emphysema s/p chest tube      + intubated    Cardiovascular hypertension, Pt. on medications and Pt. on home beta blockers +CHF   Rhythm:Regular Rate:Normal  ECHO: The left ventricle has normal systolic function, with an ejection fraction of 55-60%. The cavity size was normal. Left ventricular diastolic parameters were normal. No evidence of left ventricular regional wall motion abnormalities.  The right ventricle has normal systolic function. The cavity was mildly enlarged. There is no increase in right ventricular wall thickness.  Right ventricular systolic pressure is mildly elevated with an estimated pressure of 47.0 mmHg.  The tricuspid valve is grossly normal.  The aortic root and ascending aorta are normal in size and structure.  The inferior vena cava was dilated in size with <50% respiratory variability.    Neuro/Psych PSYCHIATRIC DISORDERS Anxiety Depression Sedated PTSD (post-traumatic stress disorder) TIA   GI/Hepatic Neg liver ROS, PUD,   Endo/Other  negative endocrine ROS  Renal/GU Renal disease     Musculoskeletal  (+) Arthritis , Chronic back pain   Abdominal (+) + obese,   Peds  Hematology  (+) anemia ,   Anesthesia Other Findings Pelvic Fracture  Reproductive/Obstetrics                            Anesthesia Physical Anesthesia Plan  ASA: 4  Anesthesia Plan: General   Post-op Pain Management:    Induction:   PONV Risk Score and Plan: 2 and Ondansetron, Dexamethasone and Treatment may vary due to age or medical condition  Airway Management Planned:  Oral ETT  Additional Equipment: Arterial line  Intra-op Plan:   Post-operative Plan: Post-operative intubation/ventilation  Informed Consent: I have reviewed the patients History and Physical, chart, labs and discussed the procedure including the risks, benefits and alternatives for the proposed anesthesia with the patient or authorized representative who has indicated his/her understanding and acceptance.     Consent reviewed with POA  Plan Discussed with: CRNA and Anesthesiologist  Anesthesia Plan Comments: (Anesthetic plan discussed with daughter via telephone)       Anesthesia Quick Evaluation

## 2021-02-06 NOTE — Transfer of Care (Signed)
Immediate Anesthesia Transfer of Care Note  Patient: Kurt [name]  Procedure(s) Performed: OPEN REDUCTION INTERNAL FIXATION (ORIF) ACETABULAR FRACTURE ANTERIOR (Right: Hip) SACRO-ILIAC PINNING (Right: Hip)  Patient Location: PACU  Anesthesia Type:General  Level of Consciousness: Patient remains intubated per anesthesia plan  Airway & Oxygen Therapy: Patient remains intubated per anesthesia plan and Patient placed on Ventilator (see vital sign flow sheet for setting)  Post-op Assessment: Report given to RN and Post -op Vital signs reviewed and stable  Post vital signs: Reviewed and stable  Last Vitals:  Vitals Value Taken Time  BP    Temp 36.9 C 02/06/21 2051  Pulse 89 02/06/21 2051  Resp 18 02/06/21 2051  SpO2 97 % 02/06/21 2051  Vitals shown include unvalidated device data.  Last Pain:  Vitals:   02/06/21 0400  TempSrc: Bladder  PainSc:          Complications: No notable events documented.

## 2021-02-06 NOTE — Anesthesia Procedure Notes (Addendum)
Arterial Line Insertion Start/End1/17/2023 3:48 PM, 02/06/2021 3:52 PM Performed by: Santa Lighter, MD, anesthesiologist  Patient location: OR. Preanesthetic checklist: patient identified and IV checked Left, radial was placed Catheter size: 20 G Hand hygiene performed  and maximum sterile barriers used   Attempts: 1 Procedure performed without using ultrasound guided technique. Following insertion, Biopatch and dressing applied. Post procedure assessment: normal  Patient tolerated the procedure well with no immediate complications.
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
## 2021-02-07 ENCOUNTER — Inpatient Hospital Stay (HOSPITAL_COMMUNITY): Payer: Medicare Other

## 2021-02-07 ENCOUNTER — Inpatient Hospital Stay: Payer: Self-pay

## 2021-02-07 ENCOUNTER — Encounter (HOSPITAL_COMMUNITY): Payer: Self-pay | Admitting: Orthopedic Surgery

## 2021-02-07 LAB — BASIC METABOLIC PANEL
Anion gap: 5 (ref 5–15)
BUN: 20 mg/dL (ref 8–23)
CO2: 25 mmol/L (ref 22–32)
Calcium: 7 mg/dL — ABNORMAL LOW (ref 8.9–10.3)
Chloride: 105 mmol/L (ref 98–111)
Creatinine, Ser: 1.06 mg/dL (ref 0.61–1.24)
GFR, Estimated: >60 mL/min (ref >60)
Glucose, Bld: 128 mg/dL — ABNORMAL HIGH (ref 70–99)
Potassium: 3.6 mmol/L (ref 3.5–5.1)
Sodium: 135 mmol/L (ref 135–145)

## 2021-02-07 LAB — CBC
HCT: 25.2 % — ABNORMAL LOW (ref 39.0–52.0)
HCT: 23.9 % — ABNORMAL LOW (ref 39.0–52.0)
Hemoglobin: 8.7 g/dL — ABNORMAL LOW (ref 13.0–17.0)
Hemoglobin: 7.9 g/dL — ABNORMAL LOW (ref 13.0–17.0)
MCH: 29.6 pg (ref 26.0–34.0)
MCH: 28.8 pg (ref 26.0–34.0)
MCHC: 34.5 g/dL (ref 30.0–36.0)
MCHC: 33.1 g/dL (ref 30.0–36.0)
MCV: 85.7 fL (ref 80.0–100.0)
MCV: 87.2 fL (ref 80.0–100.0)
Platelets: 98 10*3/uL — ABNORMAL LOW (ref 150–400)
Platelets: 101 10*3/uL — ABNORMAL LOW (ref 150–400)
RBC: 2.94 MIL/uL — ABNORMAL LOW (ref 4.22–5.81)
RBC: 2.74 MIL/uL — ABNORMAL LOW (ref 4.22–5.81)
RDW: 15.1 % (ref 11.5–15.5)
RDW: 15.1 % (ref 11.5–15.5)
WBC: 8.8 10*3/uL (ref 4.0–10.5)
WBC: 9.8 10*3/uL (ref 4.0–10.5)
nRBC: 0 % (ref 0.0–0.2)
nRBC: 0 % (ref 0.0–0.2)

## 2021-02-07 LAB — GLUCOSE, CAPILLARY
Glucose-Capillary: 109 mg/dL — ABNORMAL HIGH (ref 70–99)
Glucose-Capillary: 91 mg/dL (ref 70–99)
Glucose-Capillary: 111 mg/dL — ABNORMAL HIGH (ref 70–99)
Glucose-Capillary: 144 mg/dL — ABNORMAL HIGH (ref 70–99)
Glucose-Capillary: 143 mg/dL — ABNORMAL HIGH (ref 70–99)
Glucose-Capillary: 167 mg/dL — ABNORMAL HIGH (ref 70–99)

## 2021-02-07 LAB — MAGNESIUM
Magnesium: 1.8 mg/dL (ref 1.7–2.4)
Magnesium: 2.4 mg/dL (ref 1.7–2.4)

## 2021-02-07 LAB — PHOSPHORUS: Phosphorus: 2.4 mg/dL — ABNORMAL LOW (ref 2.5–4.6)

## 2021-02-07 LAB — HEMOGLOBIN AND HEMATOCRIT, BLOOD
HCT: 25.7 % — ABNORMAL LOW (ref 39.0–52.0)
Hemoglobin: 8.8 g/dL — ABNORMAL LOW (ref 13.0–17.0)

## 2021-02-07 LAB — PREPARE RBC (CROSSMATCH)

## 2021-02-07 MED ORDER — POTASSIUM CHLORIDE 20 MEQ PO PACK
40.0000 meq | PACK | Freq: Once | ORAL | Status: AC
Start: 2021-02-07 — End: 2021-02-07
  Administered 2021-02-07: 40 meq
  Filled 2021-02-07: qty 2

## 2021-02-07 MED ORDER — PIVOT 1.5 CAL PO LIQD
1000.0000 mL | ORAL | Status: DC
  Administered 2021-02-07 – 2021-02-12 (×5): 1000 mL

## 2021-02-07 MED ORDER — SODIUM CHLORIDE 0.9% FLUSH: 10.0000 mL | INTRAVENOUS | Status: DC | PRN

## 2021-02-07 MED ORDER — SODIUM CHLORIDE 0.9% FLUSH
10.0000 mL | Freq: Two times a day (BID) | INTRAVENOUS | Status: DC
  Administered 2021-02-07 – 2021-02-19 (×22): 10 mL

## 2021-02-07 MED ORDER — CLONAZEPAM 0.5 MG PO TABS
0.5000 mg | ORAL_TABLET | Freq: Two times a day (BID) | ORAL | Status: DC
  Administered 2021-02-07 – 2021-02-12 (×11): 0.5 mg
  Filled 2021-02-07 (×11): qty 1

## 2021-02-07 MED ORDER — PIVOT 1.5 CAL PO LIQD
1000.0000 mL | ORAL | Status: DC
  Administered 2021-02-07: 1000 mL

## 2021-02-07 MED ORDER — CEFAZOLIN SODIUM-DEXTROSE 2-4 GM/100ML-% IV SOLN
2.0000 g | Freq: Three times a day (TID) | INTRAVENOUS | Status: AC
  Administered 2021-02-07 – 2021-02-08 (×3): 2 g via INTRAVENOUS
  Filled 2021-02-07 (×3): qty 100

## 2021-02-07 MED ORDER — ACETAMINOPHEN 160 MG/5ML PO SOLN
650.0000 mg | Freq: Four times a day (QID) | ORAL | Status: DC | PRN
  Administered 2021-02-07 – 2021-02-08 (×2): 650 mg
  Filled 2021-02-07 (×2): qty 20.3

## 2021-02-07 MED ORDER — QUETIAPINE FUMARATE 25 MG PO TABS
50.0000 mg | ORAL_TABLET | Freq: Two times a day (BID) | ORAL | Status: DC
  Administered 2021-02-07 – 2021-02-11 (×10): 50 mg
  Filled 2021-02-07 (×10): qty 2

## 2021-02-07 MED ORDER — CALCIUM GLUCONATE-NACL 2-0.675 GM/100ML-% IV SOLN
2.0000 g | Freq: Once | INTRAVENOUS | Status: AC
Start: 2021-02-07 — End: 2021-02-07
  Administered 2021-02-07: 2000 mg via INTRAVENOUS
  Filled 2021-02-07: qty 100

## 2021-02-07 MED ORDER — SODIUM CHLORIDE 0.9% IV SOLUTION: Freq: Once | INTRAVENOUS | Status: AC

## 2021-02-07 MED ORDER — MAGNESIUM SULFATE 2 GM/50ML IV SOLN
2.0000 g | Freq: Once | INTRAVENOUS | Status: AC
  Administered 2021-02-07: 2 g via INTRAVENOUS
  Filled 2021-02-07: qty 50

## 2021-02-07 MED ORDER — VITAL HIGH PROTEIN PO LIQD
1000.0000 mL | ORAL | Status: DC
  Administered 2021-02-07: 1000 mL

## 2021-02-07 MED ORDER — ACETAMINOPHEN-CODEINE #3 300-30 MG PO TABS: 2.0000 | ORAL_TABLET | ORAL | Status: DC | PRN

## 2021-02-07 MED ORDER — PROSOURCE TF PO LIQD
45.0000 mL | Freq: Two times a day (BID) | ORAL | Status: DC
  Administered 2021-02-07: 45 mL
  Filled 2021-02-07: qty 45

## 2021-02-07 NOTE — Procedures (Signed)
Cortrak ° °Person Inserting Tube:  Juston Goheen L, RD °Tube Type:  Cortrak - 43 inches °Tube Size:  10 °Tube Location:  Left nare °Initial Placement:  Stomach °Secured by: Bridle °Technique Used to Measure Tube Placement:  Marking at nare/corner of mouth °Cortrak Secured At:  65 cm ° °Cortrak Tube Team Note: ° °Consult received to place a Cortrak feeding tube.  ° °X-ray is required, abdominal x-ray has been ordered by the Cortrak team. Please confirm tube placement before using the Cortrak tube.  ° °If the tube becomes dislodged please keep the tube and contact the Cortrak team at [email or web address] (password [email or web address]) for replacement.  °If after hours and replacement cannot be delayed, place a NG tube and confirm placement with an abdominal x-ray.  ° ° °Austine Wiedeman BS, RD, LDN °Clinical Dietitian °See [facility] for contact information.  ° ° °
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
## 2021-02-07 NOTE — Anesthesia Postprocedure Evaluation (Signed)
Anesthesia Post Note  Patient: Kurt [name]  Procedure(s) Performed: 1. OPEN REDUCTION INTERNAL FIXATION (ORIF) RIGHT  ACETABULAR FRACTURE USING STOPPA APPROACH 2. REMOVAL OF TRACTION PIN RIGHT TIBIA (Right: Hip) 3.LEFT AND RIGHT (TRANS) SACROILIAC SCREW FIXATION (Bilateral: Hip) 4. APPLICATION OF  WOUND, SMALL (Abdomen)     Patient location during evaluation: PACU Anesthesia Type: General Level of consciousness: awake and alert Pain management: pain level controlled Vital Signs Assessment: post-procedure vital signs reviewed and stable Respiratory status: spontaneous breathing and respiratory function stable Cardiovascular status: blood pressure returned to baseline and stable Postop Assessment: spinal receding Anesthetic complications: no   No notable events documented.  Last Vitals:  Vitals:   02/07/21 0615 02/07/21 0630  BP:    Pulse: 89 88  Resp: 18 18  Temp: 37.8 C 37.8 C  SpO2: 100% 100%    Last Pain:  Vitals:   02/06/21 0400  TempSrc: Bladder  PainSc:                  Bryan

## 2021-02-07 NOTE — Progress Notes (Signed)
Maxed out on Levophed. Called Dr. Freida Busman. Ordered to titrate Levo on systolic >90. Will continue to monitor.
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
## 2021-02-07 NOTE — Progress Notes (Signed)
This afternoon patient has become more hypotensive and is now on levophed at 10. He is on very low dose propofol and fentanyl. Good UOP. CBC this afternoon shows 1 point drop in hgb to 7.9 from this morning. Will transfuse 1u PRBCs given increasing pressor requirement.

## 2021-02-07 NOTE — Progress Notes (Signed)
Peripherally Inserted Central Catheter Placement  The IV Nurse has discussed with the patient and/or persons authorized to consent for the patient, the purpose of this procedure and the potential benefits and risks involved with this procedure.  The benefits include less needle sticks, lab draws from the catheter, and the patient may be discharged home with the catheter. Risks include, but not limited to, infection, bleeding, blood clot (thrombus formation), and puncture of an artery; nerve damage and irregular heartbeat and possibility to perform a PICC exchange if needed/ordered by physician.  Alternatives to this procedure were also discussed.  Bard Power PICC patient education guide, fact sheet on infection prevention and patient information card has been provided to patient /or left at bedside.    PICC Placement Documentation  PICC Triple Lumen A999333 PICC Left Cephalic 51 cm 1 cm (Active)  Indication for Insertion or Continuance of Line Vasoactive infusions 02/07/21 1746  Exposed Catheter (cm) 1 cm 02/07/21 1746  Site Assessment Clean;Dry;Intact 02/07/21 1746  Lumen #1 Status Flushed;Saline locked;Blood return noted 02/07/21 1746  Lumen #2 Status Flushed;Saline locked;Blood return noted 02/07/21 1746  Lumen #3 Status Flushed;Saline locked;Blood return noted 02/07/21 1746  Dressing Type Transparent;Securing device 02/07/21 1746  Dressing Status Clean;Dry;Intact 02/07/21 1746  Antimicrobial disc in place? Yes 02/07/21 1746  Safety Lock Not Applicable [id number]  Dressing Intervention Other (Comment) 02/07/21 1746  Dressing Change Due 02/14/21 02/07/21 1746    Patient's daughter, Rise Patience, signed consent via telephone. Verified by 2 IV Team RNs.   Enos Fling 02/07/2021, 5:48 PM

## 2021-02-08 ENCOUNTER — Inpatient Hospital Stay (HOSPITAL_COMMUNITY): Payer: Medicare Other

## 2021-02-08 LAB — GLUCOSE, CAPILLARY
Glucose-Capillary: 154 mg/dL — ABNORMAL HIGH (ref 70–99)
Glucose-Capillary: 130 mg/dL — ABNORMAL HIGH (ref 70–99)
Glucose-Capillary: 185 mg/dL — ABNORMAL HIGH (ref 70–99)
Glucose-Capillary: 165 mg/dL — ABNORMAL HIGH (ref 70–99)
Glucose-Capillary: 153 mg/dL — ABNORMAL HIGH (ref 70–99)
Glucose-Capillary: 145 mg/dL — ABNORMAL HIGH (ref 70–99)

## 2021-02-08 LAB — CBC
HCT: 24.5 % — ABNORMAL LOW (ref 39.0–52.0)
HCT: 23.3 % — ABNORMAL LOW (ref 39.0–52.0)
Hemoglobin: 8.4 g/dL — ABNORMAL LOW (ref 13.0–17.0)
Hemoglobin: 7.4 g/dL — ABNORMAL LOW (ref 13.0–17.0)
MCH: 29.4 pg (ref 26.0–34.0)
MCH: 27.8 pg (ref 26.0–34.0)
MCHC: 34.3 g/dL (ref 30.0–36.0)
MCHC: 31.8 g/dL (ref 30.0–36.0)
MCV: 85.7 fL (ref 80.0–100.0)
MCV: 87.6 fL (ref 80.0–100.0)
Platelets: 109 10*3/uL — ABNORMAL LOW (ref 150–400)
Platelets: 124 10*3/uL — ABNORMAL LOW (ref 150–400)
RBC: 2.86 MIL/uL — ABNORMAL LOW (ref 4.22–5.81)
RBC: 2.66 MIL/uL — ABNORMAL LOW (ref 4.22–5.81)
RDW: 15.4 % (ref 11.5–15.5)
RDW: 15.5 % (ref 11.5–15.5)
WBC: 9.9 10*3/uL (ref 4.0–10.5)
WBC: 10.9 10*3/uL — ABNORMAL HIGH (ref 4.0–10.5)
nRBC: 0 % (ref 0.0–0.2)
nRBC: 0 % (ref 0.0–0.2)

## 2021-02-08 LAB — BASIC METABOLIC PANEL
Anion gap: 5 (ref 5–15)
BUN: 17 mg/dL (ref 8–23)
CO2: 28 mmol/L (ref 22–32)
Calcium: 7.5 mg/dL — ABNORMAL LOW (ref 8.9–10.3)
Chloride: 104 mmol/L (ref 98–111)
Creatinine, Ser: 0.96 mg/dL (ref 0.61–1.24)
GFR, Estimated: >60 mL/min (ref >60)
Glucose, Bld: 163 mg/dL — ABNORMAL HIGH (ref 70–99)
Potassium: 3.7 mmol/L (ref 3.5–5.1)
Sodium: 137 mmol/L (ref 135–145)

## 2021-02-08 LAB — PREPARE RBC (CROSSMATCH)

## 2021-02-08 LAB — PHOSPHORUS
Phosphorus: 2.2 mg/dL — ABNORMAL LOW (ref 2.5–4.6)
Phosphorus: 2.4 mg/dL — ABNORMAL LOW (ref 2.5–4.6)

## 2021-02-08 LAB — MAGNESIUM
Magnesium: 2.3 mg/dL (ref 1.7–2.4)
Magnesium: 2.3 mg/dL (ref 1.7–2.4)

## 2021-02-08 MED ORDER — ALBUMIN HUMAN 5 % IV SOLN
25.0000 g | Freq: Once | INTRAVENOUS | Status: AC
  Administered 2021-02-08: 25 g via INTRAVENOUS
  Filled 2021-02-08: qty 500

## 2021-02-08 MED ORDER — POTASSIUM PHOSPHATES 15 MMOLE/5ML IV SOLN
15.0000 mmol | Freq: Once | INTRAVENOUS | Status: AC
  Administered 2021-02-08: 15 mmol via INTRAVENOUS
  Filled 2021-02-08: qty 5

## 2021-02-08 MED ORDER — ALBUMIN HUMAN 5 % IV SOLN
12.5000 g | Freq: Once | INTRAVENOUS | Status: AC
  Administered 2021-02-08: 12.5 g via INTRAVENOUS
  Filled 2021-02-08: qty 250

## 2021-02-08 MED ORDER — CALCIUM GLUCONATE-NACL 2-0.675 GM/100ML-% IV SOLN
2.0000 g | Freq: Once | INTRAVENOUS | Status: AC
  Administered 2021-02-08: 2000 mg via INTRAVENOUS
  Filled 2021-02-08: qty 100

## 2021-02-08 MED ORDER — ENOXAPARIN SODIUM 30 MG/0.3ML IJ SOSY
30.0000 mg | PREFILLED_SYRINGE | INTRAMUSCULAR | Status: DC
  Administered 2021-02-08 – 2021-02-11 (×4): 30 mg via SUBCUTANEOUS
  Filled 2021-02-08 (×4): qty 0.3

## 2021-02-08 MED ORDER — SODIUM CHLORIDE 0.9% IV SOLUTION: Freq: Once | INTRAVENOUS | Status: AC

## 2021-02-08 MED ORDER — PANTOPRAZOLE 2 MG/ML SUSPENSION
40.0000 mg | Freq: Every day | ORAL | Status: DC
  Administered 2021-02-08 – 2021-02-18 (×11): 40 mg
  Filled 2021-02-08 (×11): qty 20

## 2021-02-08 MED ORDER — LEVETIRACETAM 100 MG/ML PO SOLN
1000.0000 mg | Freq: Two times a day (BID) | ORAL | Status: DC
  Administered 2021-02-08 – 2021-02-12 (×8): 1000 mg
  Filled 2021-02-08 (×8): qty 10
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
## 2021-02-08 NOTE — Progress Notes (Signed)
Dr. Cliffton Asters was notified of patients Hg of 7.4. Orders received will continue to monitor.
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
## 2021-02-09 ENCOUNTER — Inpatient Hospital Stay (HOSPITAL_COMMUNITY): Payer: Medicare Other

## 2021-02-09 LAB — TYPE AND SCREEN
ABO/RH(D): A POS
Antibody Screen: NEGATIVE
Unit division: 0
Unit division: 0
Unit division: 0
Unit division: 0
Unit division: 0
Unit division: 0
Unit division: 0
Unit division: 0
Unit division: 0
Unit division: 0
Unit division: 0
Unit division: 0
Unit division: 0
Unit division: 0
Unit division: 0
Unit division: 0
Unit division: 0

## 2021-02-09 LAB — TRIGLYCERIDES: Triglycerides: 91 mg/dL (ref <150)

## 2021-02-09 LAB — BPAM RBC
Blood Product Expiration Date: 202301232359
Blood Product Expiration Date: 202301262359
Blood Product Expiration Date: 202301272359
Blood Product Expiration Date: 202301272359
Blood Product Expiration Date: 202302032359
Blood Product Expiration Date: 202302032359
Blood Product Expiration Date: 202302062359
Blood Product Expiration Date: 202302072359
Blood Product Expiration Date: 202302072359
Blood Product Expiration Date: 202302082359
Blood Product Expiration Date: 202302082359
Blood Product Expiration Date: 202302082359
Blood Product Expiration Date: 202302082359
Blood Product Expiration Date: 202302082359
Blood Product Expiration Date: 202302092359
Blood Product Expiration Date: 202302092359
Blood Product Expiration Date: 202302092359
ISSUE DATE / TIME: 202301160927
ISSUE DATE / TIME: 202301160936
ISSUE DATE / TIME: 202301160937
ISSUE DATE / TIME: 202301160937
ISSUE DATE / TIME: 202301160947
ISSUE DATE / TIME: 202301160947
ISSUE DATE / TIME: 202301160947
ISSUE DATE / TIME: 202301161009
ISSUE DATE / TIME: 202301161352
ISSUE DATE / TIME: 202301171201
ISSUE DATE / TIME: 202301171602
ISSUE DATE / TIME: 202301171651
ISSUE DATE / TIME: 202301171943
ISSUE DATE / TIME: 202301181625
ISSUE DATE / TIME: 202301191746
ISSUE DATE / TIME: 202301191746
ISSUE DATE / TIME: 202301192053
Unit Type and Rh: 5100
Unit Type and Rh: 5100
Unit Type and Rh: 5100
Unit Type and Rh: 5100
Unit Type and Rh: 5100
Unit Type and Rh: 5100
Unit Type and Rh: 5100
Unit Type and Rh: 5100
Unit Type and Rh: 6200
Unit Type and Rh: 6200
Unit Type and Rh: 6200
Unit Type and Rh: 6200
Unit Type and Rh: 6200
Unit Type and Rh: 6200
Unit Type and Rh: 6200
Unit Type and Rh: 6200
Unit Type and Rh: 6200

## 2021-02-09 LAB — BASIC METABOLIC PANEL
Anion gap: 6 (ref 5–15)
BUN: 19 mg/dL (ref 8–23)
CO2: 27 mmol/L (ref 22–32)
Calcium: 7 mg/dL — ABNORMAL LOW (ref 8.9–10.3)
Chloride: 103 mmol/L (ref 98–111)
Creatinine, Ser: 0.81 mg/dL (ref 0.61–1.24)
GFR, Estimated: >60 mL/min (ref >60)
Glucose, Bld: 138 mg/dL — ABNORMAL HIGH (ref 70–99)
Potassium: 3.9 mmol/L (ref 3.5–5.1)
Sodium: 136 mmol/L (ref 135–145)

## 2021-02-09 LAB — CBC
HCT: 25.8 % — ABNORMAL LOW (ref 39.0–52.0)
HCT: 25.8 % — ABNORMAL LOW (ref 39.0–52.0)
Hemoglobin: 8.4 g/dL — ABNORMAL LOW (ref 13.0–17.0)
Hemoglobin: 8.7 g/dL — ABNORMAL LOW (ref 13.0–17.0)
MCH: 28.2 pg (ref 26.0–34.0)
MCH: 29.2 pg (ref 26.0–34.0)
MCHC: 32.6 g/dL (ref 30.0–36.0)
MCHC: 33.7 g/dL (ref 30.0–36.0)
MCV: 86.6 fL (ref 80.0–100.0)
MCV: 86.6 fL (ref 80.0–100.0)
Platelets: 122 10*3/uL — ABNORMAL LOW (ref 150–400)
Platelets: 136 10*3/uL — ABNORMAL LOW (ref 150–400)
RBC: 2.98 MIL/uL — ABNORMAL LOW (ref 4.22–5.81)
RBC: 2.98 MIL/uL — ABNORMAL LOW (ref 4.22–5.81)
RDW: 15.4 % (ref 11.5–15.5)
RDW: 15.7 % — ABNORMAL HIGH (ref 11.5–15.5)
WBC: 10.9 10*3/uL — ABNORMAL HIGH (ref 4.0–10.5)
WBC: 11.3 10*3/uL — ABNORMAL HIGH (ref 4.0–10.5)
nRBC: 0 % (ref 0.0–0.2)
nRBC: 0 % (ref 0.0–0.2)

## 2021-02-09 LAB — PHOSPHORUS: Phosphorus: 2.2 mg/dL — ABNORMAL LOW (ref 2.5–4.6)

## 2021-02-09 LAB — VITAMIN D 25 HYDROXY (VIT D DEFICIENCY, FRACTURES): Vit D, 25-Hydroxy: 10.53 ng/mL — ABNORMAL LOW (ref 30–100)

## 2021-02-09 LAB — GLUCOSE, CAPILLARY
Glucose-Capillary: 155 mg/dL — ABNORMAL HIGH (ref 70–99)
Glucose-Capillary: 144 mg/dL — ABNORMAL HIGH (ref 70–99)
Glucose-Capillary: 131 mg/dL — ABNORMAL HIGH (ref 70–99)
Glucose-Capillary: 137 mg/dL — ABNORMAL HIGH (ref 70–99)
Glucose-Capillary: 142 mg/dL — ABNORMAL HIGH (ref 70–99)
Glucose-Capillary: 151 mg/dL — ABNORMAL HIGH (ref 70–99)

## 2021-02-09 LAB — MAGNESIUM: Magnesium: 2.3 mg/dL (ref 1.7–2.4)

## 2021-02-09 MED ORDER — SENNA 8.6 MG PO TABS
2.0000 | ORAL_TABLET | Freq: Every day | ORAL | Status: DC
  Administered 2021-02-09 – 2021-02-11 (×2): 17.2 mg
  Filled 2021-02-09 (×3): qty 2

## 2021-02-09 MED ORDER — CALCIUM GLUCONATE-NACL 2-0.675 GM/100ML-% IV SOLN
2.0000 g | Freq: Once | INTRAVENOUS | Status: AC
  Administered 2021-02-09: 2000 mg via INTRAVENOUS
  Filled 2021-02-09: qty 100

## 2021-02-09 MED ORDER — SODIUM PHOSPHATES 45 MMOLE/15ML IV SOLN
30.0000 mmol | Freq: Once | INTRAVENOUS | Status: AC
  Administered 2021-02-09: 30 mmol via INTRAVENOUS
  Filled 2021-02-09: qty 10

## 2021-02-09 MED ORDER — POLYETHYLENE GLYCOL 3350 17 G PO PACK
17.0000 g | PACK | Freq: Two times a day (BID) | ORAL | Status: DC
  Administered 2021-02-09 – 2021-02-11 (×6): 17 g
  Filled 2021-02-09 (×7): qty 1

## 2021-02-09 NOTE — Progress Notes (Signed)
Trauma Event Note  Rounded on patient, remains intubated/sedated. RT and primary RN at bedside, no concerns voiced at this time.   Last imported Vital Signs BP 130/67    Pulse 91    Temp 98.6 F (37 C)    Resp 18    Ht 5\' 10"  (1.778 m)    Wt 275 lb 9.2 oz (125 kg)    SpO2 97%    BMI 39.54 kg/m   Trending CBC Recent Labs    02/08/21 1640 02/09/21 0010 02/09/21 0509  WBC 10.9* 10.9* 11.3*  HGB 7.4* 8.7* 8.4*  HCT 23.3* 25.8* 25.8*  PLT 124* 122* 136*    Trending Coag's No results for input(s): APTT, INR in the last 72 hours.  Trending BMET Recent Labs    02/07/21 0602 02/08/21 0552 02/09/21 0509  NA 135 137 136  K 3.6 3.7 3.9  CL 105 104 103  CO2 25 28 27   BUN 20 17 19   CREATININE 1.06 0.96 0.81  GLUCOSE 128* 163* 138*      Kurt [name] Kurt [name]  Trauma Response RN  Please call TRN at [phone] for further assistance.

## 2021-02-09 NOTE — Progress Notes (Signed)
Trauma Event Note  Rounded on pt- spoke with son and grandson at bedside. No questions voiced.    Last imported Vital Signs BP 132/82 (BP Location: Left Arm)    Pulse (!) 104    Temp 98.1 F (36.7 C)    Resp 12    Ht 5\' 10"  (1.778 m)    Wt 275 lb 9.2 oz (125 kg)    SpO2 95%    BMI 39.54 kg/m   Trending CBC Recent Labs    02/08/21 1640 02/09/21 0010 02/09/21 0509  WBC 10.9* 10.9* 11.3*  HGB 7.4* 8.7* 8.4*  HCT 23.3* 25.8* 25.8*  PLT 124* 122* 136*    Trending Coag's No results for input(s): APTT, INR in the last 72 hours.  Trending BMET Recent Labs    02/07/21 0602 02/08/21 0552 02/09/21 0509  NA 135 137 136  K 3.6 3.7 3.9  CL 105 104 103  CO2 25 28 27   BUN 20 17 19   CREATININE 1.06 0.96 0.81  GLUCOSE 128* 163* 138*     Taytum Wheller M Yina Riviere  Trauma Response RN  Please call TRN at [phone] for further assistance.
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
## 2021-02-10 ENCOUNTER — Inpatient Hospital Stay (HOSPITAL_COMMUNITY): Payer: Medicare Other

## 2021-02-10 LAB — GLUCOSE, CAPILLARY
Glucose-Capillary: 111 mg/dL — ABNORMAL HIGH (ref 70–99)
Glucose-Capillary: 119 mg/dL — ABNORMAL HIGH (ref 70–99)
Glucose-Capillary: 122 mg/dL — ABNORMAL HIGH (ref 70–99)
Glucose-Capillary: 130 mg/dL — ABNORMAL HIGH (ref 70–99)
Glucose-Capillary: 143 mg/dL — ABNORMAL HIGH (ref 70–99)
Glucose-Capillary: 143 mg/dL — ABNORMAL HIGH (ref 70–99)

## 2021-02-10 LAB — BASIC METABOLIC PANEL
Anion gap: 6 (ref 5–15)
BUN: 25 mg/dL — ABNORMAL HIGH (ref 8–23)
CO2: 26 mmol/L (ref 22–32)
Calcium: 7.8 mg/dL — ABNORMAL LOW (ref 8.9–10.3)
Chloride: 103 mmol/L (ref 98–111)
Creatinine, Ser: 0.91 mg/dL (ref 0.61–1.24)
GFR, Estimated: >60 mL/min (ref >60)
Glucose, Bld: 115 mg/dL — ABNORMAL HIGH (ref 70–99)
Potassium: 4.3 mmol/L (ref 3.5–5.1)
Sodium: 135 mmol/L (ref 135–145)

## 2021-02-10 LAB — CBC
HCT: 27.7 % — ABNORMAL LOW (ref 39.0–52.0)
Hemoglobin: 9.4 g/dL — ABNORMAL LOW (ref 13.0–17.0)
MCH: 30 pg (ref 26.0–34.0)
MCHC: 33.9 g/dL (ref 30.0–36.0)
MCV: 88.5 fL (ref 80.0–100.0)
Platelets: 151 10*3/uL (ref 150–400)
RBC: 3.13 MIL/uL — ABNORMAL LOW (ref 4.22–5.81)
RDW: 15.7 % — ABNORMAL HIGH (ref 11.5–15.5)
WBC: 9.5 10*3/uL (ref 4.0–10.5)
nRBC: 0 % (ref 0.0–0.2)

## 2021-02-10 MED ORDER — VITAMIN D 25 MCG (1000 UNIT) PO TABS
2000.0000 [IU] | ORAL_TABLET | Freq: Every day | ORAL | Status: DC
  Administered 2021-02-10 – 2021-02-12 (×3): 2000 [IU]
  Filled 2021-02-10 (×3): qty 2

## 2021-02-10 MED ORDER — SODIUM CHLORIDE 0.9 % IV SOLN
1.0000 g | Freq: Once | INTRAVENOUS | Status: AC
  Administered 2021-02-10: 1 g via INTRAVENOUS
  Filled 2021-02-10: qty 10

## 2021-02-10 MED ORDER — VITAMIN D (ERGOCALCIFEROL) 1.25 MG (50000 UNIT) PO CAPS
50000.0000 [IU] | ORAL_CAPSULE | ORAL | Status: DC
  Filled 2021-02-10: qty 1

## 2021-02-10 NOTE — Progress Notes (Signed)
Trauma Event Note  TRN rounded on patient. Remains intubated/sedated, nodding his head to some questions, not following commands at this time. CT out today.  Last imported Vital Signs BP (!) 177/90 (BP Location: Right Arm)    Pulse (!) 112    Temp (!) 100.9 F (38.3 C) (Axillary)    Resp 20    Ht 5\' 10"  (1.778 m)    Wt 280 lb 3.3 oz (127.1 kg)    SpO2 96%    BMI 40.21 kg/m   Trending CBC Recent Labs    02/09/21 0010 02/09/21 0509 02/10/21 0456  WBC 10.9* 11.3* 9.5  HGB 8.7* 8.4* 9.4*  HCT 25.8* 25.8* 27.7*  PLT 122* 136* 151    Trending Coag's No results for input(s): APTT, INR in the last 72 hours.  Trending BMET Recent Labs    02/08/21 0552 02/09/21 0509 02/10/21 0456  NA 137 136 135  K 3.7 3.9 4.3  CL 104 103 103  CO2 28 27 26   BUN 17 19 25*  CREATININE 0.96 0.81 0.91  GLUCOSE 163* 138* 115*      Jaking Thayer O Pelham Hennick  Trauma Response RN  Please call TRN at [phone] for further assistance.
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
## 2021-02-11 ENCOUNTER — Inpatient Hospital Stay (HOSPITAL_COMMUNITY): Payer: Medicare Other

## 2021-02-11 LAB — COMPREHENSIVE METABOLIC PANEL
ALT: 19 U/L (ref 0–44)
AST: 38 U/L (ref 15–41)
Albumin: 2 g/dL — ABNORMAL LOW (ref 3.5–5.0)
Alkaline Phosphatase: 83 U/L (ref 38–126)
Anion gap: 5 (ref 5–15)
BUN: 30 mg/dL — ABNORMAL HIGH (ref 8–23)
CO2: 25 mmol/L (ref 22–32)
Calcium: 7.6 mg/dL — ABNORMAL LOW (ref 8.9–10.3)
Chloride: 107 mmol/L (ref 98–111)
Creatinine, Ser: 0.85 mg/dL (ref 0.61–1.24)
GFR, Estimated: >60 mL/min (ref >60)
Glucose, Bld: 137 mg/dL — ABNORMAL HIGH (ref 70–99)
Potassium: 4.2 mmol/L (ref 3.5–5.1)
Sodium: 137 mmol/L (ref 135–145)
Total Bilirubin: 2.1 mg/dL — ABNORMAL HIGH (ref 0.3–1.2)
Total Protein: 5 g/dL — ABNORMAL LOW (ref 6.5–8.1)

## 2021-02-11 LAB — CBC
HCT: 28.1 % — ABNORMAL LOW (ref 39.0–52.0)
Hemoglobin: 9.1 g/dL — ABNORMAL LOW (ref 13.0–17.0)
MCH: 28.9 pg (ref 26.0–34.0)
MCHC: 32.4 g/dL (ref 30.0–36.0)
MCV: 89.2 fL (ref 80.0–100.0)
Platelets: 166 10*3/uL (ref 150–400)
RBC: 3.15 MIL/uL — ABNORMAL LOW (ref 4.22–5.81)
RDW: 15.8 % — ABNORMAL HIGH (ref 11.5–15.5)
WBC: 9.4 10*3/uL (ref 4.0–10.5)
nRBC: 0 % (ref 0.0–0.2)

## 2021-02-11 LAB — BASIC METABOLIC PANEL
Anion gap: 6 (ref 5–15)
BUN: 30 mg/dL — ABNORMAL HIGH (ref 8–23)
CO2: 27 mmol/L (ref 22–32)
Calcium: 7.9 mg/dL — ABNORMAL LOW (ref 8.9–10.3)
Chloride: 104 mmol/L (ref 98–111)
Creatinine, Ser: 0.94 mg/dL (ref 0.61–1.24)
GFR, Estimated: >60 mL/min (ref >60)
Glucose, Bld: 117 mg/dL — ABNORMAL HIGH (ref 70–99)
Potassium: 4.4 mmol/L (ref 3.5–5.1)
Sodium: 137 mmol/L (ref 135–145)

## 2021-02-11 LAB — GLUCOSE, CAPILLARY
Glucose-Capillary: 112 mg/dL — ABNORMAL HIGH (ref 70–99)
Glucose-Capillary: 128 mg/dL — ABNORMAL HIGH (ref 70–99)
Glucose-Capillary: 133 mg/dL — ABNORMAL HIGH (ref 70–99)
Glucose-Capillary: 140 mg/dL — ABNORMAL HIGH (ref 70–99)
Glucose-Capillary: 120 mg/dL — ABNORMAL HIGH (ref 70–99)
Glucose-Capillary: 157 mg/dL — ABNORMAL HIGH (ref 70–99)

## 2021-02-11 MED ORDER — ENOXAPARIN SODIUM 30 MG/0.3ML IJ SOSY
30.0000 mg | PREFILLED_SYRINGE | Freq: Two times a day (BID) | INTRAMUSCULAR | Status: DC
  Administered 2021-02-11 – 2021-02-28 (×34): 30 mg via SUBCUTANEOUS
  Filled 2021-02-11 (×34): qty 0.3

## 2021-02-11 MED ORDER — SODIUM CHLORIDE 0.9 % IV SOLN
1.0000 g | Freq: Once | INTRAVENOUS | Status: AC
  Administered 2021-02-11: 1 g via INTRAVENOUS
  Filled 2021-02-11: qty 10

## 2021-02-11 MED ORDER — CHLORHEXIDINE GLUCONATE CLOTH 2 % EX PADS
6.0000 | MEDICATED_PAD | Freq: Every day | CUTANEOUS | Status: DC
  Administered 2021-02-12 – 2021-02-20 (×9): 6 via TOPICAL

## 2021-02-11 MED ORDER — FUROSEMIDE 10 MG/ML IJ SOLN
20.0000 mg | Freq: Once | INTRAMUSCULAR | Status: AC
  Administered 2021-02-11: 20 mg via INTRAVENOUS
  Filled 2021-02-11: qty 2
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
## 2021-02-12 ENCOUNTER — Inpatient Hospital Stay (HOSPITAL_COMMUNITY): Payer: Medicare Other

## 2021-02-12 LAB — CBC
HCT: 27.8 % — ABNORMAL LOW (ref 39.0–52.0)
Hemoglobin: 9 g/dL — ABNORMAL LOW (ref 13.0–17.0)
MCH: 28.9 pg (ref 26.0–34.0)
MCHC: 32.4 g/dL (ref 30.0–36.0)
MCV: 89.4 fL (ref 80.0–100.0)
Platelets: 206 10*3/uL (ref 150–400)
RBC: 3.11 MIL/uL — ABNORMAL LOW (ref 4.22–5.81)
RDW: 15.7 % — ABNORMAL HIGH (ref 11.5–15.5)
WBC: 8.9 10*3/uL (ref 4.0–10.5)
nRBC: 0 % (ref 0.0–0.2)

## 2021-02-12 LAB — GLUCOSE, CAPILLARY
Glucose-Capillary: 120 mg/dL — ABNORMAL HIGH (ref 70–99)
Glucose-Capillary: 129 mg/dL — ABNORMAL HIGH (ref 70–99)
Glucose-Capillary: 129 mg/dL — ABNORMAL HIGH (ref 70–99)
Glucose-Capillary: 139 mg/dL — ABNORMAL HIGH (ref 70–99)
Glucose-Capillary: 146 mg/dL — ABNORMAL HIGH (ref 70–99)
Glucose-Capillary: 152 mg/dL — ABNORMAL HIGH (ref 70–99)

## 2021-02-12 LAB — BASIC METABOLIC PANEL
Anion gap: 7 (ref 5–15)
BUN: 34 mg/dL — ABNORMAL HIGH (ref 8–23)
CO2: 27 mmol/L (ref 22–32)
Calcium: 8.1 mg/dL — ABNORMAL LOW (ref 8.9–10.3)
Chloride: 105 mmol/L (ref 98–111)
Creatinine, Ser: 0.92 mg/dL (ref 0.61–1.24)
GFR, Estimated: >60 mL/min (ref >60)
Glucose, Bld: 120 mg/dL — ABNORMAL HIGH (ref 70–99)
Potassium: 4.2 mmol/L (ref 3.5–5.1)
Sodium: 139 mmol/L (ref 135–145)

## 2021-02-12 LAB — TRIGLYCERIDES: Triglycerides: 93 mg/dL (ref <150)

## 2021-02-12 MED ORDER — FENTANYL CITRATE PF 50 MCG/ML IJ SOSY
50.0000 ug | PREFILLED_SYRINGE | INTRAMUSCULAR | Status: DC | PRN
  Administered 2021-02-12 – 2021-02-16 (×10): 50 ug via INTRAVENOUS
  Filled 2021-02-12 (×11): qty 1

## 2021-02-12 MED ORDER — ACETAMINOPHEN 160 MG/5ML PO SOLN
650.0000 mg | Freq: Four times a day (QID) | ORAL | Status: DC
  Administered 2021-02-12 – 2021-02-14 (×7): 650 mg
  Filled 2021-02-12 (×8): qty 20.3

## 2021-02-12 MED ORDER — LEVETIRACETAM 500 MG PO TABS: 1000.0000 mg | ORAL_TABLET | Freq: Two times a day (BID) | ORAL | Status: DC

## 2021-02-12 MED ORDER — ORAL CARE MOUTH RINSE
15.0000 mL | Freq: Two times a day (BID) | OROMUCOSAL | Status: DC
  Administered 2021-02-13 – 2021-02-27 (×23): 15 mL via OROMUCOSAL

## 2021-02-12 MED ORDER — SENNA 8.6 MG PO TABS: 2.0000 | ORAL_TABLET | Freq: Every day | ORAL | Status: DC

## 2021-02-12 MED ORDER — DOCUSATE SODIUM 100 MG PO CAPS: 100.0000 mg | ORAL_CAPSULE | Freq: Two times a day (BID) | ORAL | Status: DC

## 2021-02-12 MED ORDER — METHOCARBAMOL 750 MG PO TABS
750.0000 mg | ORAL_TABLET | Freq: Three times a day (TID) | ORAL | Status: DC
  Administered 2021-02-12 – 2021-02-13 (×5): 750 mg
  Filled 2021-02-12: qty 2
  Filled 2021-02-12: qty 1
  Filled 2021-02-12 (×3): qty 2

## 2021-02-12 MED ORDER — VITAMIN D 25 MCG (1000 UNIT) PO TABS
2000.0000 [IU] | ORAL_TABLET | Freq: Every day | ORAL | Status: DC
  Administered 2021-02-13 – 2021-02-20 (×8): 2000 [IU]
  Filled 2021-02-12 (×8): qty 2

## 2021-02-12 MED ORDER — METHOCARBAMOL 500 MG PO TABS: 750.0000 mg | ORAL_TABLET | Freq: Three times a day (TID) | ORAL | Status: DC

## 2021-02-12 MED ORDER — ACETAMINOPHEN 160 MG/5ML PO SOLN: 650.0000 mg | Freq: Four times a day (QID) | ORAL | Status: DC | PRN

## 2021-02-12 MED ORDER — LEVETIRACETAM 100 MG/ML PO SOLN
1000.0000 mg | Freq: Two times a day (BID) | ORAL | Status: DC
  Administered 2021-02-12 – 2021-02-19 (×14): 1000 mg
  Filled 2021-02-12 (×14): qty 10

## 2021-02-12 MED ORDER — FUROSEMIDE 10 MG/ML IJ SOLN
40.0000 mg | Freq: Once | INTRAMUSCULAR | Status: AC
  Administered 2021-02-12: 40 mg via INTRAVENOUS
  Filled 2021-02-12: qty 4

## 2021-02-12 MED ORDER — QUETIAPINE FUMARATE 100 MG PO TABS
100.0000 mg | ORAL_TABLET | Freq: Two times a day (BID) | ORAL | Status: DC
  Administered 2021-02-12: 100 mg
  Filled 2021-02-12: qty 1

## 2021-02-12 MED ORDER — OXYCODONE HCL 5 MG/5ML PO SOLN
10.0000 mg | ORAL | Status: DC | PRN
  Administered 2021-02-12 – 2021-02-15 (×11): 10 mg
  Filled 2021-02-12 (×11): qty 10

## 2021-02-12 MED ORDER — OXYCODONE HCL 5 MG PO TABS: 5.0000 mg | ORAL_TABLET | ORAL | Status: DC | PRN

## 2021-02-12 MED ORDER — ACETAMINOPHEN 325 MG PO TABS: 650.0000 mg | ORAL_TABLET | Freq: Four times a day (QID) | ORAL | Status: DC | PRN

## 2021-02-12 MED ORDER — LEVETIRACETAM IN NACL 1000 MG/100ML IV SOLN: 1000.0000 mg | Freq: Two times a day (BID) | INTRAVENOUS | Status: DC

## 2021-02-12 MED ORDER — CHLORHEXIDINE GLUCONATE 0.12 % MT SOLN
15.0000 mL | Freq: Two times a day (BID) | OROMUCOSAL | Status: DC
  Administered 2021-02-12 – 2021-02-28 (×32): 15 mL via OROMUCOSAL
  Filled 2021-02-12 (×31): qty 15

## 2021-02-12 MED ORDER — POLYETHYLENE GLYCOL 3350 17 G PO PACK: 17.0000 g | PACK | Freq: Every day | ORAL | Status: DC

## 2021-02-12 MED ORDER — VITAMIN D 25 MCG (1000 UNIT) PO TABS: 2000.0000 [IU] | ORAL_TABLET | Freq: Every day | ORAL | Status: DC
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
## 2021-02-12 NOTE — Procedures (Signed)
Extubation Procedure Note  Patient Details:   Name: JAVAR ESHBACH DOB: 02-09-1950 MRN: [id number]   Airway Documentation:    Vent end date: 02/12/21 Vent end time: 0956   Evaluation  O2 sats: stable throughout Complications: No apparent complications Patient did tolerate procedure well. Bilateral Breath Sounds: Diminished, Rhonchi   Yes  Pt extubated to 3L [place], cuff leak present, No stridor noted. Pt tolerated well, RN at beside, MD aware, RT will continue to monitor.   Rosalita Levan 02/12/2021, 9:56 AM
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
## 2021-02-12 NOTE — Progress Notes (Signed)
Trauma Event Note    TRN rounded on patient. Pt stable at this time, extubated today and maintaining sats on 3L [place]. Tachycardia improved. Alert to self, states "oh shit" every few minutes.   Last imported Vital Signs BP 139/66    Pulse 98    Temp 98.8 F (37.1 C)    Resp 13    Ht 5\' 10"  (1.778 m)    Wt 260 lb 5.8 oz (118.1 kg)    SpO2 96%    BMI 37.36 kg/m   Trending CBC Recent Labs    02/10/21 0456 02/11/21 0442 02/12/21 0534  WBC 9.5 9.4 8.9  HGB 9.4* 9.1* 9.0*  HCT 27.7* 28.1* 27.8*  PLT 151 166 206    Trending Coag's No results for input(s): APTT, INR in the last 72 hours.  Trending BMET Recent Labs    02/11/21 0442 02/11/21 1004 02/12/21 0534  NA 137 137 139  K 4.4 4.2 4.2  CL 104 107 105  CO2 27 25 27   BUN 30* 30* 34*  CREATININE 0.94 0.85 0.92  GLUCOSE 117* 137* 120*      Osmin Welz O Yariana Hoaglund  Trauma Response RN  Please call TRN at [phone] for further assistance.

## 2021-02-12 NOTE — Progress Notes (Signed)
Patient ID: Kurt [name], male   DOB: 1950-05-28, 71 y.o.   MRN: [id number] Noted by RT to have a large cuff leak. He was weaning well,however , so we went ahead and extubated him. Doing well so far. I spoke with his daughter.  Violeta Gelinas, MD, MPH, FACS Please use [email or web address] to contact on call provider

## 2021-02-12 NOTE — Progress Notes (Addendum)
TRN rounded on pt and spoke with pt's RN, Candise Bowens.  Pt had a cuff leak this morning and was extubated to 3L [place].  Pt drowsy on my arrival and having some difficulty coughing his secretions up.  Encouraging I.S. once he wakes up a little more. (Drowsiness may be due to klonopin and Seroquel given prior).  RN said she would try some ice chips with pt if and when he wakes up a little more.  Family at bedside.  Helped RN turn patient onto his side.  Pt ST in the 120's O2 Sat 95% on 3L , BP 144/75. Temp 99.9.  Please call Orpha Bur, TRN for further assistance [phone]

## 2021-02-13 ENCOUNTER — Inpatient Hospital Stay (HOSPITAL_COMMUNITY): Payer: Medicare Other

## 2021-02-13 LAB — GLUCOSE, CAPILLARY
Glucose-Capillary: 112 mg/dL — ABNORMAL HIGH (ref 70–99)
Glucose-Capillary: 111 mg/dL — ABNORMAL HIGH (ref 70–99)
Glucose-Capillary: 128 mg/dL — ABNORMAL HIGH (ref 70–99)
Glucose-Capillary: 118 mg/dL — ABNORMAL HIGH (ref 70–99)
Glucose-Capillary: 137 mg/dL — ABNORMAL HIGH (ref 70–99)
Glucose-Capillary: 134 mg/dL — ABNORMAL HIGH (ref 70–99)

## 2021-02-13 LAB — BASIC METABOLIC PANEL
Anion gap: 9 (ref 5–15)
BUN: 29 mg/dL — ABNORMAL HIGH (ref 8–23)
CO2: 27 mmol/L (ref 22–32)
Calcium: 7.9 mg/dL — ABNORMAL LOW (ref 8.9–10.3)
Chloride: 104 mmol/L (ref 98–111)
Creatinine, Ser: 0.93 mg/dL (ref 0.61–1.24)
GFR, Estimated: >60 mL/min (ref >60)
Glucose, Bld: 123 mg/dL — ABNORMAL HIGH (ref 70–99)
Potassium: 4.1 mmol/L (ref 3.5–5.1)
Sodium: 140 mmol/L (ref 135–145)

## 2021-02-13 LAB — CBC
HCT: 30.3 % — ABNORMAL LOW (ref 39.0–52.0)
Hemoglobin: 9.5 g/dL — ABNORMAL LOW (ref 13.0–17.0)
MCH: 28.5 pg (ref 26.0–34.0)
MCHC: 31.4 g/dL (ref 30.0–36.0)
MCV: 91 fL (ref 80.0–100.0)
Platelets: 256 10*3/uL (ref 150–400)
RBC: 3.33 MIL/uL — ABNORMAL LOW (ref 4.22–5.81)
RDW: 15.4 % (ref 11.5–15.5)
WBC: 11.1 10*3/uL — ABNORMAL HIGH (ref 4.0–10.5)
nRBC: 0 % (ref 0.0–0.2)

## 2021-02-13 MED ORDER — ONDANSETRON HCL 4 MG/2ML IJ SOLN
4.0000 mg | Freq: Four times a day (QID) | INTRAMUSCULAR | Status: DC | PRN
  Administered 2021-02-13 – 2021-02-27 (×3): 4 mg via INTRAVENOUS
  Filled 2021-02-13 (×3): qty 2

## 2021-02-13 MED ORDER — ZOLPIDEM TARTRATE 5 MG PO TABS
5.0000 mg | ORAL_TABLET | Freq: Every day | ORAL | Status: DC
  Administered 2021-02-13: 21:00:00 5 mg via ORAL
  Filled 2021-02-13: qty 1

## 2021-02-13 MED ORDER — POLYETHYLENE GLYCOL 3350 17 G PO PACK
17.0000 g | PACK | Freq: Every day | ORAL | Status: DC | PRN
Start: 2021-02-13 — End: 2021-03-01

## 2021-02-13 MED ORDER — MELATONIN 5 MG PO TABS
5.0000 mg | ORAL_TABLET | Freq: Every evening | ORAL | Status: DC | PRN
  Administered 2021-02-13 – 2021-02-27 (×13): 5 mg
  Filled 2021-02-13 (×15): qty 1

## 2021-02-13 MED ORDER — FUROSEMIDE 10 MG/ML IJ SOLN
20.0000 mg | Freq: Once | INTRAMUSCULAR | Status: AC
  Administered 2021-02-13: 11:00:00 20 mg via INTRAVENOUS
  Filled 2021-02-13: qty 2

## 2021-02-13 MED ORDER — ZOLPIDEM TARTRATE 5 MG PO TABS
5.0000 mg | ORAL_TABLET | Freq: Every day | ORAL | Status: DC
  Administered 2021-02-14: 21:00:00 5 mg
  Filled 2021-02-13: qty 1

## 2021-02-13 MED ORDER — CALCIUM GLUCONATE-NACL 1-0.675 GM/50ML-% IV SOLN
1.0000 g | Freq: Once | INTRAVENOUS | Status: AC
  Administered 2021-02-13: 11:00:00 1000 mg via INTRAVENOUS
  Filled 2021-02-13: qty 50

## 2021-02-13 MED ORDER — PIVOT 1.5 CAL PO LIQD
1000.0000 mL | ORAL | Status: DC
  Administered 2021-02-13 – 2021-02-14 (×2): 1000 mL
  Filled 2021-02-13 (×4): qty 1000

## 2021-02-13 NOTE — Progress Notes (Signed)
Pt arrived to 4NP-05. Pt A&O x 3 denies pain. Pt with 3 bags of belongings. Assessment and vital signs documented. Pt oriented to unit. Call bell within reach.
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
## 2021-02-14 LAB — GLUCOSE, CAPILLARY
Glucose-Capillary: 123 mg/dL — ABNORMAL HIGH (ref 70–99)
Glucose-Capillary: 131 mg/dL — ABNORMAL HIGH (ref 70–99)
Glucose-Capillary: 136 mg/dL — ABNORMAL HIGH (ref 70–99)
Glucose-Capillary: 139 mg/dL — ABNORMAL HIGH (ref 70–99)
Glucose-Capillary: 142 mg/dL — ABNORMAL HIGH (ref 70–99)
Glucose-Capillary: 150 mg/dL — ABNORMAL HIGH (ref 70–99)

## 2021-02-14 MED ORDER — FUROSEMIDE 10 MG/ML IJ SOLN
40.0000 mg | Freq: Once | INTRAMUSCULAR | Status: AC
  Administered 2021-02-14: 11:00:00 40 mg via INTRAVENOUS
  Filled 2021-02-14: qty 4

## 2021-02-14 MED ORDER — ACETAMINOPHEN 160 MG/5ML PO SOLN
1000.0000 mg | Freq: Four times a day (QID) | ORAL | Status: DC
  Administered 2021-02-14 – 2021-02-18 (×14): 1000 mg
  Filled 2021-02-14 (×15): qty 40.6

## 2021-02-14 MED ORDER — METHOCARBAMOL 500 MG PO TABS
1000.0000 mg | ORAL_TABLET | Freq: Four times a day (QID) | ORAL | Status: DC
  Administered 2021-02-14 – 2021-02-18 (×16): 1000 mg
  Filled 2021-02-14 (×16): qty 2
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
## 2021-02-14 NOTE — Progress Notes (Signed)
Trauma Event Note   TRN rounded on patient, primary RN at bedside giving pain meds. Alert, remains a little confused. Pt stable at this time, VS WDL. No needs at this time.   Last imported Vital Signs BP 120/66 (BP Location: Right Arm)    Pulse 100    Temp 99.7 F (37.6 C) (Axillary)    Resp 17    Ht 5\' 10"  (1.778 m)    Wt 282 lb 3 oz (128 kg)    SpO2 91%    BMI 40.49 kg/m   Trending CBC Recent Labs    02/12/21 0534 02/13/21 0528  WBC 8.9 11.1*  HGB 9.0* 9.5*  HCT 27.8* 30.3*  PLT 206 256    Trending Coag's No results for input(s): APTT, INR in the last 72 hours.  Trending BMET Recent Labs    02/12/21 0534 02/13/21 0528  NA 139 140  K 4.2 4.1  CL 105 104  CO2 27 27  BUN 34* 29*  CREATININE 0.92 0.93  GLUCOSE 120* 123*      Kurt [name] O Franz Svec  Trauma Response RN  Please call TRN at [phone] for further assistance.

## 2021-02-14 NOTE — Care Management Important Message (Signed)
Important Message  Patient Details  Name: Kurt [name] MRN: [id number] Date of Birth: 11-16-1950   Medicare Important Message Given:  Yes     Sherilyn Banker 02/14/2021, 10:45 AM

## 2021-02-15 LAB — CBC
HCT: 28.9 % — ABNORMAL LOW (ref 39.0–52.0)
Hemoglobin: 8.9 g/dL — ABNORMAL LOW (ref 13.0–17.0)
MCH: 28.3 pg (ref 26.0–34.0)
MCHC: 30.8 g/dL (ref 30.0–36.0)
MCV: 92 fL (ref 80.0–100.0)
Platelets: 371 10*3/uL (ref 150–400)
RBC: 3.14 MIL/uL — ABNORMAL LOW (ref 4.22–5.81)
RDW: 15.2 % (ref 11.5–15.5)
WBC: 10.1 10*3/uL (ref 4.0–10.5)
nRBC: 0 % (ref 0.0–0.2)

## 2021-02-15 LAB — BASIC METABOLIC PANEL
Anion gap: 6 (ref 5–15)
BUN: 27 mg/dL — ABNORMAL HIGH (ref 8–23)
CO2: 30 mmol/L (ref 22–32)
Calcium: 8.1 mg/dL — ABNORMAL LOW (ref 8.9–10.3)
Chloride: 106 mmol/L (ref 98–111)
Creatinine, Ser: 0.78 mg/dL (ref 0.61–1.24)
GFR, Estimated: >60 mL/min (ref >60)
Glucose, Bld: 131 mg/dL — ABNORMAL HIGH (ref 70–99)
Potassium: 3.9 mmol/L (ref 3.5–5.1)
Sodium: 142 mmol/L (ref 135–145)

## 2021-02-15 LAB — GLUCOSE, CAPILLARY
Glucose-Capillary: 133 mg/dL — ABNORMAL HIGH (ref 70–99)
Glucose-Capillary: 133 mg/dL — ABNORMAL HIGH (ref 70–99)
Glucose-Capillary: 155 mg/dL — ABNORMAL HIGH (ref 70–99)
Glucose-Capillary: 115 mg/dL — ABNORMAL HIGH (ref 70–99)
Glucose-Capillary: 136 mg/dL — ABNORMAL HIGH (ref 70–99)
Glucose-Capillary: 128 mg/dL — ABNORMAL HIGH (ref 70–99)

## 2021-02-15 LAB — TRIGLYCERIDES: Triglycerides: 110 mg/dL (ref <150)

## 2021-02-15 MED ORDER — OXYCODONE HCL 5 MG/5ML PO SOLN
5.0000 mg | ORAL | Status: DC | PRN
  Administered 2021-02-15 (×3): 10 mg
  Administered 2021-02-16: 5 mg
  Administered 2021-02-16 (×2): 10 mg
  Administered 2021-02-16: 5 mg
  Administered 2021-02-17 – 2021-02-18 (×5): 10 mg
  Filled 2021-02-15: qty 10
  Filled 2021-02-15: qty 5
  Filled 2021-02-15 (×2): qty 10
  Filled 2021-02-15: qty 5
  Filled 2021-02-15 (×7): qty 10

## 2021-02-15 MED ORDER — PIVOT 1.5 CAL PO LIQD
1000.0000 mL | ORAL | Status: DC
  Administered 2021-02-15 – 2021-02-18 (×4): 1000 mL
  Filled 2021-02-15 (×4): qty 1000

## 2021-02-15 MED ORDER — NEPRO/CARBSTEADY PO LIQD
237.0000 mL | Freq: Three times a day (TID) | ORAL | Status: DC
  Administered 2021-02-15 – 2021-02-20 (×11): 237 mL via ORAL
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
## 2021-02-16 LAB — GLUCOSE, CAPILLARY
Glucose-Capillary: 133 mg/dL — ABNORMAL HIGH (ref 70–99)
Glucose-Capillary: 142 mg/dL — ABNORMAL HIGH (ref 70–99)
Glucose-Capillary: 154 mg/dL — ABNORMAL HIGH (ref 70–99)
Glucose-Capillary: 129 mg/dL — ABNORMAL HIGH (ref 70–99)
Glucose-Capillary: 113 mg/dL — ABNORMAL HIGH (ref 70–99)
Glucose-Capillary: 125 mg/dL — ABNORMAL HIGH (ref 70–99)

## 2021-02-16 MED ORDER — HYDROMORPHONE HCL 1 MG/ML IJ SOLN
1.0000 mg | INTRAMUSCULAR | Status: DC | PRN
  Administered 2021-02-16 – 2021-02-18 (×6): 1 mg via INTRAVENOUS
  Filled 2021-02-16 (×6): qty 1
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
## 2021-02-17 LAB — CBC
HCT: 30.7 % — ABNORMAL LOW (ref 39.0–52.0)
Hemoglobin: 9.5 g/dL — ABNORMAL LOW (ref 13.0–17.0)
MCH: 28.2 pg (ref 26.0–34.0)
MCHC: 30.9 g/dL (ref 30.0–36.0)
MCV: 91.1 fL (ref 80.0–100.0)
Platelets: 487 10*3/uL — ABNORMAL HIGH (ref 150–400)
RBC: 3.37 MIL/uL — ABNORMAL LOW (ref 4.22–5.81)
RDW: 15.6 % — ABNORMAL HIGH (ref 11.5–15.5)
WBC: 12.9 10*3/uL — ABNORMAL HIGH (ref 4.0–10.5)
nRBC: 0 % (ref 0.0–0.2)

## 2021-02-17 LAB — GLUCOSE, CAPILLARY
Glucose-Capillary: 148 mg/dL — ABNORMAL HIGH (ref 70–99)
Glucose-Capillary: 137 mg/dL — ABNORMAL HIGH (ref 70–99)
Glucose-Capillary: 113 mg/dL — ABNORMAL HIGH (ref 70–99)
Glucose-Capillary: 105 mg/dL — ABNORMAL HIGH (ref 70–99)
Glucose-Capillary: 91 mg/dL (ref 70–99)
Glucose-Capillary: 101 mg/dL — ABNORMAL HIGH (ref 70–99)

## 2021-02-17 LAB — BASIC METABOLIC PANEL
Anion gap: 8 (ref 5–15)
BUN: 25 mg/dL — ABNORMAL HIGH (ref 8–23)
CO2: 26 mmol/L (ref 22–32)
Calcium: 8.2 mg/dL — ABNORMAL LOW (ref 8.9–10.3)
Chloride: 106 mmol/L (ref 98–111)
Creatinine, Ser: 0.71 mg/dL (ref 0.61–1.24)
GFR, Estimated: >60 mL/min (ref >60)
Glucose, Bld: 135 mg/dL — ABNORMAL HIGH (ref 70–99)
Potassium: 4 mmol/L (ref 3.5–5.1)
Sodium: 140 mmol/L (ref 135–145)
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
## 2021-02-18 LAB — GLUCOSE, CAPILLARY
Glucose-Capillary: 109 mg/dL — ABNORMAL HIGH (ref 70–99)
Glucose-Capillary: 113 mg/dL — ABNORMAL HIGH (ref 70–99)
Glucose-Capillary: 114 mg/dL — ABNORMAL HIGH (ref 70–99)
Glucose-Capillary: 114 mg/dL — ABNORMAL HIGH (ref 70–99)
Glucose-Capillary: 128 mg/dL — ABNORMAL HIGH (ref 70–99)

## 2021-02-18 MED ORDER — ACETAMINOPHEN 160 MG/5ML PO SOLN
1000.0000 mg | Freq: Four times a day (QID) | ORAL | Status: DC | PRN
  Administered 2021-02-18 – 2021-02-19 (×2): 1000 mg
  Filled 2021-02-18 (×5): qty 40.6

## 2021-02-18 MED ORDER — OXYCODONE HCL 5 MG/5ML PO SOLN
5.0000 mg | Freq: Four times a day (QID) | ORAL | Status: DC | PRN
  Administered 2021-02-18 – 2021-02-19 (×4): 5 mg
  Filled 2021-02-18 (×4): qty 5

## 2021-02-18 MED ORDER — HYDROMORPHONE HCL 1 MG/ML IJ SOLN
1.0000 mg | Freq: Once | INTRAMUSCULAR | Status: AC
Start: 2021-02-18 — End: 2021-02-18
  Administered 2021-02-18: 1 mg via INTRAVENOUS
  Filled 2021-02-18: qty 1

## 2021-02-18 MED ORDER — HYDROMORPHONE HCL 1 MG/ML IJ SOLN
1.0000 mg | Freq: Four times a day (QID) | INTRAMUSCULAR | Status: DC | PRN
  Administered 2021-02-18 – 2021-02-27 (×22): 1 mg via INTRAVENOUS
  Filled 2021-02-18 (×23): qty 1

## 2021-02-18 MED ORDER — METHOCARBAMOL 500 MG PO TABS
1000.0000 mg | ORAL_TABLET | Freq: Four times a day (QID) | ORAL | Status: DC | PRN
  Administered 2021-02-18 – 2021-02-19 (×3): 1000 mg
  Filled 2021-02-18 (×3): qty 2
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
## 2021-02-19 LAB — GLUCOSE, CAPILLARY
Glucose-Capillary: 113 mg/dL — ABNORMAL HIGH (ref 70–99)
Glucose-Capillary: 101 mg/dL — ABNORMAL HIGH (ref 70–99)
Glucose-Capillary: 141 mg/dL — ABNORMAL HIGH (ref 70–99)
Glucose-Capillary: 116 mg/dL — ABNORMAL HIGH (ref 70–99)
Glucose-Capillary: 110 mg/dL — ABNORMAL HIGH (ref 70–99)

## 2021-02-19 MED ORDER — ACETAMINOPHEN 500 MG PO TABS
1000.0000 mg | ORAL_TABLET | Freq: Four times a day (QID) | ORAL | Status: DC
  Administered 2021-02-19 – 2021-02-28 (×36): 1000 mg via ORAL
  Filled 2021-02-19 (×38): qty 2

## 2021-02-19 MED ORDER — HALOPERIDOL LACTATE 5 MG/ML IJ SOLN
5.0000 mg | Freq: Once | INTRAMUSCULAR | Status: AC
  Administered 2021-02-19: 5 mg via INTRAVENOUS
  Filled 2021-02-19: qty 1

## 2021-02-19 MED ORDER — BUSPIRONE HCL 5 MG PO TABS
7.5000 mg | ORAL_TABLET | Freq: Three times a day (TID) | ORAL | Status: DC
  Administered 2021-02-19 – 2021-02-23 (×13): 7.5 mg via ORAL
  Filled 2021-02-19 (×13): qty 2

## 2021-02-19 MED ORDER — OXYCODONE HCL 5 MG PO TABS
5.0000 mg | ORAL_TABLET | ORAL | Status: DC | PRN
  Administered 2021-02-19 – 2021-02-26 (×31): 10 mg via ORAL
  Administered 2021-02-26: 5 mg via ORAL
  Administered 2021-02-27 – 2021-02-28 (×4): 10 mg via ORAL
  Filled 2021-02-19 (×38): qty 2

## 2021-02-19 MED ORDER — LEVETIRACETAM 500 MG PO TABS
1000.0000 mg | ORAL_TABLET | Freq: Two times a day (BID) | ORAL | Status: DC
  Administered 2021-02-19 – 2021-02-28 (×18): 1000 mg via ORAL
  Filled 2021-02-19 (×18): qty 2

## 2021-02-19 MED ORDER — METHOCARBAMOL 500 MG PO TABS
1000.0000 mg | ORAL_TABLET | Freq: Four times a day (QID) | ORAL | Status: DC
  Administered 2021-02-19 – 2021-02-28 (×38): 1000 mg via ORAL
  Filled 2021-02-19 (×38): qty 2

## 2021-02-19 MED ORDER — PANTOPRAZOLE SODIUM 40 MG PO TBEC
40.0000 mg | DELAYED_RELEASE_TABLET | Freq: Every day | ORAL | Status: DC
  Administered 2021-02-19 – 2021-02-27 (×9): 40 mg via ORAL
  Filled 2021-02-19 (×9): qty 1

## 2021-02-19 MED ORDER — KETOROLAC TROMETHAMINE 15 MG/ML IJ SOLN
15.0000 mg | Freq: Four times a day (QID) | INTRAMUSCULAR | Status: AC
  Administered 2021-02-19 – 2021-02-22 (×11): 15 mg via INTRAVENOUS
  Filled 2021-02-19 (×11): qty 1
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
## 2021-02-19 NOTE — TOC Progression Note (Signed)
Transition of Care [facility]) - Progression Note    Patient Details  Name: Kurt [name] MRN: [id number] Date of Birth: Aug 06, 1950  [facility]) CM/SW Contact  Glennon Mac, RN Phone Number: 02/19/2021, 12:52 PM  Clinical Narrative:    Patient with slow progression; Cortrak feeding tube and tube feedings have been discontinued, and patient now on a diet.  Spoke with patient's daughter, [name], regarding PT/OT recommendation for rehab at [facility].  She states that they anticipated this, and is agreeable to patient information being faxed out for SNF search.  TOC will initiate FL2 and fax out; will provide bed offers as available.   Expected Discharge Plan:  (TBD) Barriers to Discharge: Continued Medical Work up  Expected Discharge Plan and Services Expected Discharge Plan:  (TBD)   Discharge Planning Services: CM Consult                                           Social Determinants of Health (SDOH) Interventions    Readmission Risk Interventions Readmission Risk Prevention Plan 02/19/2019  HRI or Home Care Consult Complete  Social Work Consult for Recovery Care Planning/Counseling Complete  Palliative Care Screening Not Applicable  Medication Review (RN Care Manager) Complete  Some recent data might be hidden   Quintella Baton, RN, BSN  Trauma/Neuro ICU Case Manager [phone]

## 2021-02-19 NOTE — Progress Notes (Signed)
° °   ° °  RE:  Kurt [name]      Date of Birth:  04/24/1950    Date:   02/19/2021      To Whom It May Concern:  Please be advised that the above-named patient will require a short-term nursing home stay - anticipated 30 days or less for rehabilitation and strengthening.  The plan is for return home.

## 2021-02-19 NOTE — Progress Notes (Signed)
SLP Cancellation Note  Patient Details Name: Kurt [name] MRN: [id number] DOB: August 27, 1950   Cancelled treatment:       Reason Eval/Treat Not Completed: Patient declined, no reason specified  Attempted to assess pt tolerance of current diet (Dys3/NTL) and determine appropriateness for advanced textures or repeat instrumental study, however, pt is currently agitated and perseverating on leaving the hospital. He refused all PO presentations despite attempts to explain rationale for the visit. Pt's sister is present and appears to be quite overwhelmed with pt's current state. Will continue efforts.   Chirstopher Iovino B. Quentin Ore, [facility], [place] Speech Language Pathologist Office: [phone]  Kurt [name] 02/19/2021, 11:03 AM

## 2021-02-19 NOTE — NC FL2 (Addendum)
[place] LEVEL OF CARE SCREENING TOOL     IDENTIFICATION  Patient Name: Kurt [name] Birthdate: Sep 14, 1950 Sex: male Admission Date (Current Location): 02/05/2021  [facility] and [place] Number:  Herbalist and Address:  The [place], [place] [address] [place], [place] [address]      Provider Number: [id number]  Attending Physician Name and Address:  Md, Trauma, MD  Relative Name and Phone Number:  Turitt,Stacy (Daughter)   [phone] (Mobile)    Current Level of Care: Hospital Recommended Level of Care: [place] Prior Approval Number:    Date Approved/Denied:   PASRR Number: Pending  Discharge Plan: SNF    Current Diagnoses: Patient Active Problem List   Diagnosis Date Noted   Pelvic fracture ([place]) 02/05/2021   Frequent falls 06/18/2019   Bilateral carpal tunnel syndrome 06/18/2019   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   Numbness and tingling of left upper extremity 06/07/2019   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, left 03/22/2019   On home oxygen therapy 03/22/2019   Impaired gait [id number]   Diastolic CHF ([place]) [id number]   Physical debility 02/23/2019   COVID-19    Cavitary pneumonia 02/17/2019   Diarrhea 02/16/2019   Respiratory failure, acute ([place]) 02/16/2019   Pulmonary nodule    OSA (obstructive sleep apnea) 01/20/2019   Acute renal failure superimposed on stage 3a chronic kidney disease ([place]) 01/20/2019   Chronic sinusitis 01/20/2019   Acute on chronic respiratory failure ([place]) 01/18/2019   Failure to thrive in adult 01/18/2019   Major depression, chronic 01/18/2019   Class 2 obesity with body mass index (BMI) of 36.0 to 36.9 in adult 01/18/2019   History of COVID-19 12/27/2018   Acute respiratory failure with hypoxia ([place]) 12/27/2018   CKD (chronic kidney disease) stage 3, GFR 30-59 ml/min (HCC) 12/27/2018   CHF (congestive heart failure) ([place]) 12/27/2018   QT prolongation 06/15/2018   Lumbar  stenosis with neurogenic claudication 03/22/2016   Spinal stenosis, thoracic 02/16/2015   Thoracic stenosis 02/16/2015   GI bleed 04/29/2014   Acute kidney injury superimposed on CKD ([place]) 04/29/2014   TIA (transient ischemic attack) 07/08/2012   Diverticulitis 07/08/2012   Hypertension 07/08/2012    Orientation RESPIRATION BLADDER Height & Weight     Self, Time, Situation, Place  Normal External catheter Weight: 270 lb 11.6 oz (122.8 kg) Height:  5\' 10"  (177.8 cm)  BEHAVIORAL SYMPTOMS/MOOD NEUROLOGICAL BOWEL NUTRITION STATUS      Incontinent Diet (See d/c summary)  AMBULATORY STATUS COMMUNICATION OF NEEDS Skin   Extensive Assist   Other (Comment), Surgical wounds, PU Stage and Appropriate Care (Pressury injury sacrum; puncture, right elbow; head laceration, left posterior; laceration right thigh; incisionson  chest, abdomen, and hip)                       Personal Care Assistance Level of Assistance  Bathing, Feeding, Dressing Bathing Assistance: Maximum assistance Feeding assistance: Independent Dressing Assistance: Maximum assistance     Functional Limitations Info  Sight, Speech, Hearing Sight Info: Adequate Hearing Info: Adequate Speech Info: Adequate    SPECIAL CARE FACTORS FREQUENCY  PT (By licensed PT), OT (By licensed OT)     PT Frequency: 5x/week OT Frequency: 5x/week            Contractures Contractures Info: Not present    Additional Factors Info  Code Status, Allergies Code Status Info: Full code Allergies Info: Lyrica (pregabalin  Current Medications (02/19/2021):  This is the current hospital active medication list Current Facility-Administered Medications  Medication Dose Route Frequency Provider Last Rate Last Admin   0.9 %  sodium chloride infusion  250 mL Intravenous Continuous Ainsley Spinner, PA-C       0.9 %  sodium chloride infusion  10 mL/hr Intravenous Once Ainsley Spinner, PA-C       acetaminophen (TYLENOL) tablet 1,000 mg   1,000 mg Oral Q6H Saverio Danker, PA-C   1,000 mg at 02/19/21 1027   busPIRone (BUSPAR) tablet 7.5 mg  7.5 mg Oral TID Saverio Danker, PA-C   7.5 mg at 02/19/21 1228   chlorhexidine (PERIDEX) 0.12 % solution 15 mL  15 mL Mouth Rinse BID Kinsinger, Arta Bruce, MD   15 mL at 02/19/21 [id number]   Chlorhexidine Gluconate Cloth 2 % PADS 6 each  6 each Topical Daily Stechschulte, Nickola Major, MD   6 each at 02/18/21 2238   cholecalciferol (VITAMIN D3) tablet 2,000 Units  2,000 Units Per Tube Daily Georganna Skeans, MD   2,000 Units at 02/19/21 0832   enoxaparin (LOVENOX) injection 30 mg  30 mg Subcutaneous Q12H Ralene Ok, MD   30 mg at 02/19/21 [id number]   feeding supplement (NEPRO CARB STEADY) liquid 237 mL  237 mL Oral TID BM Barkley Boards R, PA-C   237 mL at 02/19/21 1232   HYDROmorphone (DILAUDID) injection 1 mg  1 mg Intravenous Q6H PRN Romana Juniper A, MD   1 mg at 02/19/21 [id number]   ipratropium-albuterol (DUONEB) 0.5-2.5 (3) MG/3ML nebulizer solution 3 mL  3 mL Nebulization Q4H PRN Ainsley Spinner, PA-C       ketorolac (TORADOL) 15 MG/ML injection 15 mg  15 mg Intravenous Q6H Saverio Danker, PA-C   15 mg at 02/19/21 1231   levETIRAcetam (KEPPRA) tablet 1,000 mg  1,000 mg Oral BID Saverio Danker, PA-C       MEDLINE mouth rinse  15 mL Mouth Rinse q12n4p Kinsinger, Arta Bruce, MD   15 mL at 02/19/21 1231   melatonin tablet 5 mg  5 mg Per Tube QHS PRN Kinsinger, Arta Bruce, MD   5 mg at 02/18/21 2230   methocarbamol (ROBAXIN) tablet 1,000 mg  1,000 mg Oral QID Saverio Danker, PA-C   1,000 mg at 02/19/21 1227   ondansetron (ZOFRAN) injection 4 mg  4 mg Intravenous Q6H PRN Georganna Skeans, MD   4 mg at 02/18/21 [id number]   oxyCODONE (Oxy IR/ROXICODONE) immediate release tablet 5-10 mg  5-10 mg Oral Q4H PRN Saverio Danker, PA-C   10 mg at 02/19/21 1350   pantoprazole (PROTONIX) EC tablet 40 mg  40 mg Oral QHS Saverio Danker, PA-C       polyethylene glycol (MIRALAX / GLYCOLAX) packet 17 g  17 g Oral Daily PRN Georganna Skeans, MD       sodium chloride flush (NS) 0.9 % injection 10-40 mL  10-40 mL Intracatheter Q12H Georganna Skeans, MD   10 mL at 02/19/21 [id number]   sodium chloride flush (NS) 0.9 % injection 10-40 mL  10-40 mL Intracatheter PRN Georganna Skeans, MD         Discharge Medications: Please see discharge summary for a list of discharge medications.  Relevant Imaging Results:  Relevant Lab Results:   Additional Information SSN [place], [place]
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
## 2021-02-19 NOTE — Progress Notes (Signed)
I was told in report that patient says he is not getting enough pain medicine.

## 2021-02-20 LAB — GLUCOSE, CAPILLARY
Glucose-Capillary: 91 mg/dL (ref 70–99)
Glucose-Capillary: 95 mg/dL (ref 70–99)
Glucose-Capillary: 105 mg/dL — ABNORMAL HIGH (ref 70–99)
Glucose-Capillary: 99 mg/dL (ref 70–99)
Glucose-Capillary: 111 mg/dL — ABNORMAL HIGH (ref 70–99)

## 2021-02-20 LAB — URINALYSIS, ROUTINE W REFLEX MICROSCOPIC
Bilirubin Urine: NEGATIVE
Glucose, UA: NEGATIVE mg/dL
Ketones, ur: NEGATIVE mg/dL
Leukocytes,Ua: NEGATIVE
Nitrite: NEGATIVE
Protein, ur: NEGATIVE mg/dL
Specific Gravity, Urine: 1.025 (ref 1.005–1.030)
pH: 5.5 (ref 5.0–8.0)

## 2021-02-20 LAB — BASIC METABOLIC PANEL
Anion gap: 7 (ref 5–15)
BUN: 20 mg/dL (ref 8–23)
CO2: 23 mmol/L (ref 22–32)
Calcium: 7.9 mg/dL — ABNORMAL LOW (ref 8.9–10.3)
Chloride: 106 mmol/L (ref 98–111)
Creatinine, Ser: 0.8 mg/dL (ref 0.61–1.24)
GFR, Estimated: >60 mL/min (ref >60)
Glucose, Bld: 104 mg/dL — ABNORMAL HIGH (ref 70–99)
Potassium: 4 mmol/L (ref 3.5–5.1)
Sodium: 136 mmol/L (ref 135–145)

## 2021-02-20 LAB — CBC
HCT: 28.5 % — ABNORMAL LOW (ref 39.0–52.0)
Hemoglobin: 8.8 g/dL — ABNORMAL LOW (ref 13.0–17.0)
MCH: 28.2 pg (ref 26.0–34.0)
MCHC: 30.9 g/dL (ref 30.0–36.0)
MCV: 91.3 fL (ref 80.0–100.0)
Platelets: 560 10*3/uL — ABNORMAL HIGH (ref 150–400)
RBC: 3.12 MIL/uL — ABNORMAL LOW (ref 4.22–5.81)
RDW: 15.9 % — ABNORMAL HIGH (ref 11.5–15.5)
WBC: 11.4 10*3/uL — ABNORMAL HIGH (ref 4.0–10.5)
nRBC: 0 % (ref 0.0–0.2)

## 2021-02-20 LAB — URINALYSIS, MICROSCOPIC (REFLEX): Bacteria, UA: NONE SEEN

## 2021-02-20 MED ORDER — VITAMIN D 25 MCG (1000 UNIT) PO TABS
2000.0000 [IU] | ORAL_TABLET | Freq: Every day | ORAL | Status: DC
  Administered 2021-02-21 – 2021-02-28 (×8): 2000 [IU] via ORAL
  Filled 2021-02-20 (×8): qty 2

## 2021-02-20 NOTE — Progress Notes (Addendum)
0500-When RN went to round on patient. Patient was found on side close to rail, with one leg over the rail and attempting to get out of bed. RN also found pt left arm and sheets covered in blood. Pt somehow ripped his picc lumens in half,with picc line partially out and dressing undone. RN removed the remaining picc line and placed gauze and tape over it. Rn will put in consult for pt to receive IV Toradol.

## 2021-02-20 NOTE — TOC Progression Note (Addendum)
Transition of Care [facility]) - Progression Note    Patient Details  Name: Kurt [name] MRN: [id number] Date of Birth: 02-18-1950  [facility] Care [facility]) CM/SW Contact  Glennon Mac, RN Phone Number: 02/20/2021, 3:59 PM  Clinical Narrative:    Current bed offers given to patient's daughter, Kennyth Arnold.  PASSR Reviewer to meet with patient and complete assessment tomorrow.  Daughter asks that I fax referral to [facility] in [place], [facility] and [facility] in [place].  Referrals sent as requested.  Also spoke with French Ana in admissions for [facility], who states she will review patient for potential admission.  Will follow up with daughter on Wednesday with updates on bed offers.     Expected Discharge Plan:  (TBD) Barriers to Discharge: Continued Medical Work up  Expected Discharge Plan and Services Expected Discharge Plan:  (TBD)   Discharge Planning Services: CM Consult                                           Social Determinants of Health (SDOH) Interventions    Readmission Risk Interventions Readmission Risk Prevention Plan 02/19/2019  HRI or Home Care Consult Complete  Social Work Consult for Recovery Care Planning/Counseling Complete  Palliative Care Screening Not Applicable  Medication Review (RN Care Manager) Complete  Some recent data might be hidden   Quintella Baton, RN, BSN  Trauma/Neuro ICU Case Manager [phone]

## 2021-02-20 NOTE — TOC Progression Note (Signed)
Transition of Care [facility]) - Progression Note    Patient Details  Name: Kurt [name] MRN: [id number] Date of Birth: Mar 10, 1950  Transition of Care [facility]) CM/SW Contact  Erin Sons, [place] Phone Number: 02/20/2021, 8:57 AM  Clinical Narrative:     Received call from Ophelia Shoulder who plans to meet with pt and complete Level II PASSR assessment on 02/22/20.   Expected Discharge Plan:  (TBD) Barriers to Discharge: Continued Medical Work up  Expected Discharge Plan and Services Expected Discharge Plan:  (TBD)   Discharge Planning Services: CM Consult                                           Social Determinants of Health (SDOH) Interventions    Readmission Risk Interventions Readmission Risk Prevention Plan 02/19/2019  HRI or Home Care Consult Complete  Social Work Consult for Recovery Care Planning/Counseling Complete  Palliative Care Screening Not Applicable  Medication Review [employer]) Complete  Some recent data might be hidden
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
## 2021-02-21 ENCOUNTER — Inpatient Hospital Stay (HOSPITAL_COMMUNITY): Payer: Medicare Other

## 2021-02-21 LAB — GLUCOSE, CAPILLARY
Glucose-Capillary: 96 mg/dL (ref 70–99)
Glucose-Capillary: 107 mg/dL — ABNORMAL HIGH (ref 70–99)
Glucose-Capillary: 98 mg/dL (ref 70–99)

## 2021-02-21 LAB — URINE CULTURE: Culture: <10000 — AB

## 2021-02-21 MED ORDER — ENSURE ENLIVE PO LIQD: 237.0000 mL | Freq: Three times a day (TID) | ORAL | Status: DC

## 2021-02-21 MED ORDER — BOOST / RESOURCE BREEZE PO LIQD CUSTOM
1.0000 | Freq: Three times a day (TID) | ORAL | Status: DC
  Administered 2021-02-21: 1 via ORAL
  Administered 2021-02-21: 237 mL via ORAL
  Administered 2021-02-21 – 2021-02-22 (×2): 1 via ORAL
  Administered 2021-02-22: 237 mL via ORAL
  Administered 2021-02-22 – 2021-02-28 (×7): 1 via ORAL

## 2021-02-21 MED ORDER — ADULT MULTIVITAMIN W/MINERALS CH
1.0000 | ORAL_TABLET | Freq: Every day | ORAL | Status: DC
  Administered 2021-02-21 – 2021-02-28 (×8): 1 via ORAL
  Filled 2021-02-21 (×8): qty 1
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
## 2021-02-22 NOTE — Progress Notes (Signed)
Pt called out c/o chest pain states "it is in the middle of my chest I'm having an anxiety attack". BP 173/81 P 78. EKG performed and placed in chart. Pain medication administered (see eMAR). Pt resting in bed with eyes closed. Repeat BP 156/73 P 73. MD paged see new orders.
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
## 2021-02-22 NOTE — TOC Progression Note (Signed)
Transition of Care [facility]) - Progression Note    Patient Details  Name: Kurt [name] MRN: [id number] Date of Birth: [birth year]  [facility] CM/SW Contact  Glennon Mac, RN Phone Number: 02/22/2021, 1:48 PM  Clinical Narrative:    Patient was visited yesterday by [facility] Reviewer for Level 2 PASSR; PASSR number still pending. All bed offers have been discussed with patient's daughter, Kennyth Arnold, and she/family request [facility] at [facility].  Spoke with Demetria in Admissions at [facility]; made her aware of family preference.  She states facility has no bed availability until Monday, February 6.    Plan discharge to SNF pending Level 2 PASSR number obtained; will follow with updates as available.    Expected Discharge Plan: Skilled Nursing Facility Barriers to Discharge: Awaiting State Approval [facility])  Expected Discharge Plan and Services Expected Discharge Plan: Skilled Nursing Facility   Discharge Planning Services: CM Consult   Living arrangements for the past 2 months: Single Family Home                                       Social Determinants of Health (SDOH) Interventions    Readmission Risk Interventions Readmission Risk Prevention Plan 02/19/2019  HRI or Home Care Consult Complete  Social Work Consult for Recovery Care Planning/Counseling Complete  Palliative Care Screening Not Applicable  Medication Review (RN Care Manager) Complete  Some recent data might be hidden   Quintella Baton, RN, BSN  Trauma/Neuro ICU Case Manager [phone]

## 2021-02-23 MED ORDER — BUSPIRONE HCL 5 MG PO TABS
10.0000 mg | ORAL_TABLET | Freq: Three times a day (TID) | ORAL | Status: DC
  Administered 2021-02-23 – 2021-02-28 (×14): 10 mg via ORAL
  Filled 2021-02-23 (×14): qty 2
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
## 2021-02-23 NOTE — TOC Progression Note (Addendum)
Transition of Care [facility]) - Progression Note    Patient Details  Name: Kurt [name] MRN: [id number] Date of Birth: 06-24-1950  Transition of [facility]) CM/SW Contact  Astrid Drafts Berna Spare, RN Phone Number: 02/23/2021, 3:50 PM  Clinical Narrative:    PASSR number has been received.  Left message for Demetrius at [facility] Admissions to discuss bed availability, possibly on Monday.  Will provide updates as available. Recommend pre-facility Covid testing on Sunday to prepare for possible discharge to SNF on Monday.     Expected Discharge Plan: Skilled Nursing Facility Barriers to Discharge: Awaiting State Approval [facility])  Expected Discharge Plan and Services Expected Discharge Plan: Skilled Nursing Facility   Discharge Planning Services: CM Consult   Living arrangements for the past 2 months: Single Family Home                                       Social Determinants of Health (SDOH) Interventions    Readmission Risk Interventions Readmission Risk Prevention Plan 02/19/2019  HRI or Home Care Consult Complete  Social Work Consult for Recovery Care Planning/Counseling Complete  Palliative Care Screening Not Applicable  Medication Review Oceanographer) Complete  Some recent data might be hidden

## 2021-02-23 NOTE — NC FL2 (Signed)
[place] LEVEL OF CARE SCREENING TOOL     IDENTIFICATION  Patient Name: Kurt [name] Birthdate: 1950-10-18 Sex: male Admission Date (Current Location): 02/05/2021  [facility] and [place] Number:  Herbalist and Address:  [facility] [place], [place] [address] [place], [place] [address]      Provider Number: [id number]  Attending Physician Name and Address:  Md, Trauma, MD  Relative Name and Phone Number:  Turitt,Stacy (Daughter)   [phone] (Mobile)    Current Level of Care: Hospital Recommended Level of Care: [place] Prior Approval Number:    Date Approved/Denied:   PASRR Number: [id number]  Discharge Plan: SNF    Current Diagnoses: Patient Active Problem List   Diagnosis Date Noted   Pelvic fracture ([place]) 02/05/2021   Frequent falls 06/18/2019   Bilateral carpal tunnel syndrome 06/18/2019   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   Numbness and tingling of left upper extremity 06/07/2019   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, left 03/22/2019   On home oxygen therapy 03/22/2019   Impaired gait [id number]   Diastolic CHF ([place]) [id number]   Physical debility 02/23/2019   COVID-19    Cavitary pneumonia 02/17/2019   Diarrhea 02/16/2019   Respiratory failure, acute ([place]) 02/16/2019   Pulmonary nodule    OSA (obstructive sleep apnea) 01/20/2019   Acute renal failure superimposed on stage 3a chronic kidney disease ([place]) 01/20/2019   Chronic sinusitis 01/20/2019   Acute on chronic respiratory failure ([place]) 01/18/2019   Failure to thrive in adult 01/18/2019   Major depression, chronic 01/18/2019   Class 2 obesity with body mass index (BMI) of 36.0 to 36.9 in adult 01/18/2019   History of COVID-19 12/27/2018   Acute respiratory failure with hypoxia ([place]) 12/27/2018   CKD (chronic kidney disease) stage 3, GFR 30-59 ml/min (HCC) 12/27/2018   CHF (congestive heart failure) ([place]) 12/27/2018   QT prolongation 06/15/2018    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   Spinal stenosis, thoracic 02/16/2015   Thoracic stenosis 02/16/2015   GI bleed 04/29/2014   Acute kidney injury superimposed on CKD ([place]) 04/29/2014   TIA (transient ischemic attack) 07/08/2012   Diverticulitis 07/08/2012   Hypertension 07/08/2012    Orientation RESPIRATION BLADDER Height & Weight     Self, Time, Situation, Place  Normal External catheter Weight: 117.4 kg Height:  5\' 10"  (177.8 cm)  BEHAVIORAL SYMPTOMS/MOOD NEUROLOGICAL BOWEL NUTRITION STATUS      Incontinent Diet (See d/c summary)  AMBULATORY STATUS COMMUNICATION OF NEEDS Skin   Extensive Assist   Other (Comment), Surgical wounds, PU Stage and Appropriate Care (Pressury injury sacrum; puncture, right elbow; head laceration, left posterior; laceration right thigh; incisionson  chest, abdomen, and hip)                       Personal Care Assistance Level of Assistance  Bathing, Feeding, Dressing Bathing Assistance: Maximum assistance Feeding assistance: Independent Dressing Assistance: Maximum assistance     Functional Limitations Info  Sight, Speech, Hearing Sight Info: Adequate Hearing Info: Adequate Speech Info: Adequate    SPECIAL CARE FACTORS FREQUENCY  PT (By licensed PT), OT (By licensed OT)     PT Frequency: 5x/week OT Frequency: 5x/week            Contractures Contractures Info: Not present    Additional Factors Info  Code Status, Allergies Code Status Info: Full code Allergies Info: Lyrica (pregabalin  Current Medications (02/23/2021):  This is the current hospital active medication list Current Facility-Administered Medications  Medication Dose Route Frequency Provider Last Rate Last Admin   0.9 %  sodium chloride infusion  250 mL Intravenous Continuous Ainsley Spinner, PA-C       0.9 %  sodium chloride infusion  10 mL/hr Intravenous Once Ainsley Spinner, PA-C       acetaminophen (TYLENOL) tablet 1,000 mg  1,000 mg  Oral Q6H Saverio Danker, PA-C   1,000 mg at 02/23/21 [id number]   busPIRone (BUSPAR) tablet 10 mg  10 mg Oral TID Saverio Danker, PA-C       chlorhexidine (PERIDEX) 0.12 % solution 15 mL  15 mL Mouth Rinse BID Kinsinger, Arta Bruce, MD   15 mL at 02/23/21 [id number]   cholecalciferol (VITAMIN D3) tablet 2,000 Units  2,000 Units Oral Daily Saverio Danker, PA-C   2,000 Units at 02/23/21 [id number]   enoxaparin (LOVENOX) injection 30 mg  30 mg Subcutaneous Q12H Ralene Ok, MD   30 mg at 02/23/21 0919   feeding supplement (BOOST / RESOURCE BREEZE) liquid 1 Container  1 Container Oral TID BM Saverio Danker, PA-C   1 Container at 02/23/21 [id number]   HYDROmorphone (DILAUDID) injection 1 mg  1 mg Intravenous Q6H PRN Romana Juniper A, MD   1 mg at 02/23/21 0451   ipratropium-albuterol (DUONEB) 0.5-2.5 (3) MG/3ML nebulizer solution 3 mL  3 mL Nebulization Q4H PRN Ainsley Spinner, PA-C       levETIRAcetam (KEPPRA) tablet 1,000 mg  1,000 mg Oral BID Saverio Danker, PA-C   1,000 mg at 02/23/21 [id number]   MEDLINE mouth rinse  15 mL Mouth Rinse q12n4p Kinsinger, Arta Bruce, MD   15 mL at 02/23/21 1223   melatonin tablet 5 mg  5 mg Per Tube QHS PRN Kinsinger, Arta Bruce, MD   5 mg at 02/22/21 2132   methocarbamol (ROBAXIN) tablet 1,000 mg  1,000 mg Oral QID Saverio Danker, PA-C   1,000 mg at 02/23/21 1356   multivitamin with minerals tablet 1 tablet  1 tablet Oral Daily Saverio Danker, PA-C   1 tablet at 02/23/21 0918   ondansetron (ZOFRAN) injection 4 mg  4 mg Intravenous Q6H PRN Georganna Skeans, MD   4 mg at 02/18/21 [id number]   oxyCODONE (Oxy IR/ROXICODONE) immediate release tablet 5-10 mg  5-10 mg Oral Q4H PRN Saverio Danker, PA-C   10 mg at 02/23/21 1356   pantoprazole (PROTONIX) EC tablet 40 mg  40 mg Oral QHS Saverio Danker, PA-C   40 mg at 02/22/21 2131   polyethylene glycol (MIRALAX / GLYCOLAX) packet 17 g  17 g Oral Daily PRN Georganna Skeans, MD         Discharge Medications: Please see discharge summary for a list of discharge  medications.  Relevant Imaging Results:  Relevant Lab Results:   Additional Information SSN [id number]  Reinaldo Raddle, RN, BSN  Trauma/Neuro ICU Case Manager [phone]
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
## 2021-02-24 NOTE — Progress Notes (Signed)
2/4 @1211  Pt. moved from bed to MRI table and stated to take him off the table, did not want to continue and couldnt lay properly into shoulder coil, RN aware
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
## 2021-02-26 ENCOUNTER — Inpatient Hospital Stay (HOSPITAL_COMMUNITY): Payer: Medicare Other

## 2021-02-26 LAB — SARS CORONAVIRUS 2 (TAT 6-24 HRS): SARS Coronavirus 2: NEGATIVE

## 2021-02-26 MED ORDER — ACETAMINOPHEN 500 MG PO TABS: 1000.0000 mg | ORAL_TABLET | Freq: Four times a day (QID) | ORAL | 0 refills | Status: DC | PRN

## 2021-02-26 MED ORDER — RIVAROXABAN 10 MG PO TABS: 10.0000 mg | ORAL_TABLET | Freq: Every day | ORAL | 0 refills | Status: DC

## 2021-02-26 MED ORDER — POLYETHYLENE GLYCOL 3350 17 G PO PACK: 17.0000 g | PACK | Freq: Every day | ORAL | 0 refills | Status: DC | PRN

## 2021-02-26 MED ORDER — BUSPIRONE HCL 10 MG PO TABS: 10.0000 mg | ORAL_TABLET | Freq: Three times a day (TID) | ORAL | Status: DC

## 2021-02-26 MED ORDER — ALPRAZOLAM 0.25 MG PO TABS
0.5000 mg | ORAL_TABLET | Freq: Once | ORAL | Status: AC
  Administered 2021-02-27: 0.5 mg via ORAL
  Filled 2021-02-26: qty 2

## 2021-02-26 MED ORDER — METHOCARBAMOL 1000 MG PO TABS: 1000.0000 mg | ORAL_TABLET | Freq: Four times a day (QID) | ORAL | Status: DC | PRN

## 2021-02-26 MED ORDER — OXYCODONE HCL 5 MG PO TABS: 5.0000 mg | ORAL_TABLET | ORAL | 0 refills | Status: DC | PRN
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
## 2021-02-26 NOTE — TOC Progression Note (Incomplete Revision)
Transition of Care (TOC) - Progression Note  ° ° °Patient Details  °Name: Kurt [name] °MRN: [id number] °Date of Birth: 11/08/1950 ° °Transition of Care (TOC) CM/SW Contact  °Airrion Otting M, RN °Phone Number: °02/26/2021, 1:46 PM ° °Clinical Narrative:    °Multiple attempts have been made to reach [facility] Admissions Dept to confirm bed availability; originally facility said bed would be available today.  Insurance auth pending; will continue attempts to reach facility to confirm when bed is available.  ° °02/26/21 Addendum 1422 °Received a text message from admissions director Hazel, stating that facility is not accepting patient, and she will not give a reason as to why he is being denied admission.  Explained that patient had accepted patient on Friday, and family was accepting of bed offer.  She states that patient was only accepted by corporate office, and DON and facility manager were the ones who have denied his admission.  I have spoken with TOC Director Zack Brooks regarding this situation, and he plans to contact facility management.  Will follow with updates as they are available.  ° °Addendum: 1700 °Left messages for [facility], [facility] and [facility].  Christy from [facility] did call back and stated she would review referral and get back to me.   ° ° °Expected Discharge Plan: Skilled Nursing Facility ° °Expected Discharge Plan and Services °Expected Discharge Plan: Skilled Nursing Facility °  °Discharge Planning Services: CM Consult °  °Living arrangements for the past 2 months: Single Family Home °                °  °  °  °  °  °  °  °  °  °  ° ° °Social Determinants of Health (SDOH) Interventions °  ° °Readmission Risk Interventions °Readmission Risk Prevention Plan 02/19/2019  °HRI or Home Care Consult Complete  °Social Work Consult for Recovery Care Planning/Counseling Complete  °Palliative Care Screening Not Applicable  °Medication Review (RN Care Manager) Complete  °Some recent data  might be hidden  ° °Kesler Wickham W. Rebecka Oelkers, RN, BSN  °Trauma/Neuro ICU Case Manager °[phone] ° ° °

## 2021-02-26 NOTE — Progress Notes (Signed)
Called and spoke to daughter, Daiva Eves, for an update.  We discussed his hospital delirium as well as his MRI.  She really wanted to speak with Mellody Dance, from ortho.  Mellody Dance actually called her while we were on the phone together.  She understands the plan to DC to SNF when insurance auth completed and is agreeable to this.  Letha Cape 10:22 AM 02/26/2021
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
## 2021-02-26 NOTE — TOC Progression Note (Addendum)
Transition of Care [facility]) - Progression Note    Patient Details  Name: Kurt [name] MRN: [id number] Date of Birth: 1950-02-27  Transition of Care [facility]) CM/SW Contact  Astrid Drafts Berna Spare, RN Phone Number: 02/26/2021, 1:46 PM  Clinical Narrative:    Multiple attempts have been made to reach [facility] Admissions Dept to confirm bed availability; originally facility said bed would be available today.  Insurance auth pending; will continue attempts to reach facility to confirm when bed is available.   02/26/21 Addendum 1422 Received a text message from admissions director Hazel, stating that facility is not accepting patient, and she will not give a reason as to why he is being denied admission.  Explained that patient had accepted patient on Friday, and family was accepting of bed offer.  She states that patient was only accepted by corporate office, and DON and facility manager were the ones who have denied his admission.  I have spoken with [facility] Director Wandra Mannan regarding this situation, and he plans to contact facility management.  Will follow with updates as they are available.   Addendum: 1700 Left messages for [facility], [facility] and [facility].  Christy from [facility] did call back and stated she would review referral and get back to me.     Expected Discharge Plan: Skilled Nursing Facility  Expected Discharge Plan and Services Expected Discharge Plan: Skilled Nursing Facility   Discharge Planning Services: CM Consult   Living arrangements for the past 2 months: Single Family Home                                       Social Determinants of Health (SDOH) Interventions    Readmission Risk Interventions Readmission Risk Prevention Plan 02/19/2019  HRI or Home Care Consult Complete  Social Work Consult for Recovery Care Planning/Counseling Complete  Palliative Care Screening Not Applicable  Medication Review (RN Care Manager) Complete  Some recent data  might be hidden   Quintella Baton, RN, BSN  Trauma/Neuro ICU Case Manager [phone]

## 2021-02-27 ENCOUNTER — Inpatient Hospital Stay (HOSPITAL_COMMUNITY): Payer: Medicare Other

## 2021-02-27 ENCOUNTER — Encounter (HOSPITAL_COMMUNITY): Payer: Self-pay | Admitting: General Surgery
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
## 2021-02-27 NOTE — TOC Progression Note (Addendum)
Transition of Care [facility]) - Progression Note    Patient Details  Name: Kurt [name] MRN: [id number] Date of Birth: Dec 30, 1950  Transition of Care [facility]) CM/SW Contact  Astrid Drafts Berna Spare, RN Phone Number: 02/27/2021, 4:35 PM  Clinical Narrative:    Patient has been accepted for admission at [facility], with likely bed available on 02/28/2021; daughter agreeable to this facility.  Insurance authorization has been submitted through [facility], per protocol. [employer] ID [id number]. Repeat Covid testing ordered, as test has to be within 24h of discharge.  Plan dc to [facility] on 02/28/21 pending insurance authorization and bed available. Will follow up in AM.    Expected Discharge Plan: Skilled Nursing Facility Barriers to Discharge: SNF Pending bed offer, Insurance Authorization  Expected Discharge Plan and Services Expected Discharge Plan: Skilled Nursing Facility   Discharge Planning Services: CM Consult   Living arrangements for the past 2 months: Single Family Home                                       Social Determinants of Health (SDOH) Interventions    Readmission Risk Interventions Readmission Risk Prevention Plan 02/19/2019  HRI or Home Care Consult Complete  Social Work Consult for Recovery Care Planning/Counseling Complete  Palliative Care Screening Not Applicable  Medication Review (RN Care Manager) Complete  Some recent data might be hidden   Quintella Baton, RN, BSN  Trauma/Neuro ICU Case Manager [phone]
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
## 2021-02-28 LAB — SARS CORONAVIRUS 2 (TAT 6-24 HRS): SARS Coronavirus 2: NEGATIVE

## 2021-02-28 NOTE — TOC Transition Note (Addendum)
Transition of Care [facility]) - CM/SW Discharge Note   Patient Details  Name: Kurt [name] MRN: [id number] Date of Birth: 1950-06-25  Transition of Care [facility]) CM/SW Contact:  Ella Bodo, RN Phone Number: 02/28/2021, 11:47 AM   Clinical Narrative:    Discharge summary and transfer report forwarded to [facility] in admissions at [facility].  Patient going to room 3214; PTAR transport scheduled for 2:30pm pickup, per facility request.  Updated daughter Marzetta Board on transportation arrangements.  She plans to go to the facility at 2:00pm to sign paperwork.  Bedside nurse to call report to [phone].     Final next level of care: Skilled Nursing Facility Barriers to Discharge: Barriers Resolved   Patient Goals and CMS Choice Patient states their goals for this hospitalization and ongoing recovery are:: to move better CMS Medicare.gov Compare Post Acute Care list provided to:: Patient Represenative (must comment) (daughter Rise Patience) Choice offered to / list presented to : Adult Children  Discharge Placement PASRR number recieved: 02/23/21            Patient chooses bed at: [facility] Patient to be transferred to facility by: [place] Name of family member notified: Daisey Must, daughter Patient and family notified of of transfer: 02/28/21  Discharge Plan and Services   Discharge Planning Services: CM Consult Post Acute Care Choice: [place]                               Social Determinants of Health (SDOH) Interventions     Readmission Risk Interventions Readmission Risk Prevention Plan 02/19/2019  [place] or Home Care Consult Complete  Social Work Consult for [place] Planning/Counseling Complete  Palliative Care Screening Not Applicable  Medication Review (RN Care Manager) Complete  Some recent data might be hidden   Reinaldo Raddle, RN, BSN  Trauma/Neuro ICU Case Manager [phone]

## 2021-02-28 NOTE — Progress Notes (Signed)
Prevena wound vac box with remaining contents sent with PTAR with pt per family friend request.  Called and advised daughter Kennyth Arnold.

## 2021-02-28 NOTE — Care Management Important Message (Signed)
Important Message  Patient Details  Name: Kurt [name] MRN: [id number] Date of Birth: [birth year]   Medicare Important Message Given:  Yes     Sherilyn Banker 02/28/2021, 3:58 PM

## 2021-02-28 NOTE — Progress Notes (Signed)
Sutures removed from anterior pelvis.  Area cleansed and dried, new dressing applied.  No signs or symptoms of infection.

## 2021-02-28 NOTE — Progress Notes (Addendum)
Nurse to nurse report given to Walker Kehr, RN
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
## 2021-02-28 NOTE — TOC Progression Note (Addendum)
Transition of Care [facility]) - Progression Note    Patient Details  Name: Kurt [name] MRN: [id number] Date of Birth: Feb 18, 1950  Transition of Care [facility]) CM/SW Contact  Glennon Mac, RN Phone Number: 02/28/2021, 9:03 AM  Clinical Narrative:    Insurance authorization pending this morning.  Will continue to provide updates as available.  Addendum: [id number] Authorization has been received from [facility] for admission to [facility].  Authorization approved from 02/28/21 to 03/02/21; [facility] Rogelia Rohrer.  Fax clinical updates to [phone].  Will forward discharge summary when available.     Expected Discharge Plan: Skilled Nursing Facility Barriers to Discharge: Insurance Authorization  Expected Discharge Plan and Services Expected Discharge Plan: Skilled Nursing Facility   Discharge Planning Services: CM Consult   Living arrangements for the past 2 months: Single Family Home                                       Social Determinants of Health (SDOH) Interventions    Readmission Risk Interventions Readmission Risk Prevention Plan 02/19/2019  HRI or Home Care Consult Complete  Social Work Consult for Recovery Care Planning/Counseling Complete  Palliative Care Screening Not Applicable  Medication Review (RN Care Manager) Complete  Some recent data might be hidden   Quintella Baton, RN, BSN  Trauma/Neuro ICU Case Manager [phone]

## 2021-03-02 ENCOUNTER — Encounter (HOSPITAL_COMMUNITY): Payer: Self-pay

## 2021-03-02 ENCOUNTER — Emergency Department (HOSPITAL_COMMUNITY): Payer: Medicare Other

## 2021-03-02 ENCOUNTER — Other Ambulatory Visit: Payer: Self-pay

## 2021-03-02 ENCOUNTER — Emergency Department (HOSPITAL_COMMUNITY)
Admission: EM | Admit: 2021-03-02 | Discharge: 2021-03-02 | Disposition: A | Discharge status: Home / Self Care | Payer: Medicare Other | Attending: Emergency Medicine | Admitting: Emergency Medicine

## 2021-03-02 DIAGNOSIS — Z789 Other specified health status: Secondary | ICD-10-CM

## 2021-03-02 DIAGNOSIS — S3210XS Unspecified fracture of sacrum, sequela: Secondary | ICD-10-CM | POA: Insufficient documentation

## 2021-03-02 DIAGNOSIS — Z7901 Long term (current) use of anticoagulants: Secondary | ICD-10-CM | POA: Insufficient documentation

## 2021-03-02 DIAGNOSIS — W06XXXA Fall from bed, initial encounter: Secondary | ICD-10-CM | POA: Diagnosis not present

## 2021-03-02 DIAGNOSIS — Y9289 Other specified places as the place of occurrence of the external cause: Secondary | ICD-10-CM | POA: Insufficient documentation

## 2021-03-02 DIAGNOSIS — S32810D Multiple fractures of pelvis with stable disruption of pelvic ring, subsequent encounter for fracture with routine healing: Secondary | ICD-10-CM | POA: Diagnosis not present

## 2021-03-02 DIAGNOSIS — S3992XA Unspecified injury of lower back, initial encounter: Secondary | ICD-10-CM | POA: Diagnosis present

## 2021-03-02 DIAGNOSIS — Z87828 Personal history of other (healed) physical injury and trauma: Secondary | ICD-10-CM | POA: Insufficient documentation

## 2021-03-02 MED ORDER — OXYCODONE-ACETAMINOPHEN 5-325 MG PO TABS
1.0000 | ORAL_TABLET | Freq: Once | ORAL | Status: AC
Start: 2021-03-02 — End: 2021-03-02
  Administered 2021-03-02: 1 via ORAL
  Filled 2021-03-02: qty 1
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
## 2021-03-02 NOTE — ED Notes (Signed)
PT[facility] called at 17:17; 16th in line.
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
## 2021-03-02 NOTE — ED Notes (Signed)
Pt left with [facility] in stable condition. Discharge paperwork was went over with pt and facility is aware of pt's transport back to facility.   VSS upon discharge.

## 2021-03-02 NOTE — Discharge Instructions (Addendum)
It was our pleasure to provide your ER care today - we hope that you feel better.  Take your pain meds as need. Fall precautions.   Follow up closely with your doctor in the coming week - discuss plan for physical and occupational therapy.  Should you decide you wish to pursue a new/different facility, you must facilitate that at your current facility, in conjunction with their social workers and your doctor (the ER is not able to keep patients for new facility placement).   Return to ER if worse, new symptoms, fevers, severe/intractable pain, chest pain, trouble breathing, or other concern.

## 2021-03-02 NOTE — Progress Notes (Signed)
°   03/02/21 1842  TOC ED Mini Assessment  TOC Time spent with patient (minutes): 30  PING Used in TOC Assessment No  Admission or Readmission Diverted Yes  Interventions which prevented an admission or readmission Other (must enter comment) (SNF procedures)  What brought you to the Emergency Department?  Decreased sensation in legs  Barriers to Discharge No Barriers Identified  Barrier interventions CSW met with Pt at bedside. Pt states that he is unhappy with [facility] and feels like he would be able to care for himself at home with [facility]. CSW and Pt discussed the realistic parameters of the Pt's needs and the assistance available to him at home even with [facility] providing services. RN came to bedside and gave Pt the message that his daughter had called and spoken with RN, and daughter supports the plan for Pt to return to facility as daughter feels that there is no one to help him at home. Pt verbalizes understanding that he will return to facility. Pt plans to speak to administrator about dissatisfaction. CSW explained the process of seeking a transfer from one facility to another.  Means of departure Ambulance

## 2021-03-07 ENCOUNTER — Encounter (HOSPITAL_COMMUNITY): Payer: Self-pay | Admitting: Emergency Medicine

## 2021-03-07 ENCOUNTER — Emergency Department (HOSPITAL_COMMUNITY): Payer: Medicare Other

## 2021-03-07 ENCOUNTER — Other Ambulatory Visit: Payer: Self-pay

## 2021-03-07 ENCOUNTER — Emergency Department (HOSPITAL_COMMUNITY)
Admission: EM | Admit: 2021-03-07 | Discharge: 2021-03-07 | Disposition: A | Discharge status: Home / Self Care | Payer: Medicare Other | Attending: Emergency Medicine | Admitting: Emergency Medicine

## 2021-03-07 DIAGNOSIS — S0990XA Unspecified injury of head, initial encounter: Secondary | ICD-10-CM | POA: Diagnosis not present

## 2021-03-07 DIAGNOSIS — W19XXXA Unspecified fall, initial encounter: Secondary | ICD-10-CM

## 2021-03-07 DIAGNOSIS — S300XXA Contusion of lower back and pelvis, initial encounter: Secondary | ICD-10-CM | POA: Insufficient documentation

## 2021-03-07 DIAGNOSIS — Y92129 Unspecified place in nursing home as the place of occurrence of the external cause: Secondary | ICD-10-CM | POA: Diagnosis not present

## 2021-03-07 DIAGNOSIS — W050XXA Fall from non-moving wheelchair, initial encounter: Secondary | ICD-10-CM | POA: Insufficient documentation

## 2021-03-07 DIAGNOSIS — S3992XA Unspecified injury of lower back, initial encounter: Secondary | ICD-10-CM | POA: Diagnosis present

## 2021-03-07 DIAGNOSIS — E876 Hypokalemia: Secondary | ICD-10-CM | POA: Diagnosis not present

## 2021-03-07 LAB — CBC WITH DIFFERENTIAL/PLATELET
Abs Immature Granulocytes: 0.04 10*3/uL (ref 0.00–0.07)
Basophils Absolute: 0.1 10*3/uL (ref 0.0–0.1)
Basophils Relative: 1 %
Eosinophils Absolute: 0.2 10*3/uL (ref 0.0–0.5)
Eosinophils Relative: 1 %
HCT: 45.3 % (ref 39.0–52.0)
Hemoglobin: 14.1 g/dL (ref 13.0–17.0)
Immature Granulocytes: 0 %
Lymphocytes Relative: 6 %
Lymphs Abs: 0.7 10*3/uL (ref 0.7–4.0)
MCH: 28.7 pg (ref 26.0–34.0)
MCHC: 31.1 g/dL (ref 30.0–36.0)
MCV: 92.1 fL (ref 80.0–100.0)
Monocytes Absolute: 0.8 10*3/uL (ref 0.1–1.0)
Monocytes Relative: 7 %
Neutro Abs: 9.4 10*3/uL — ABNORMAL HIGH (ref 1.7–7.7)
Neutrophils Relative %: 85 %
Platelets: 292 10*3/uL (ref 150–400)
RBC: 4.92 MIL/uL (ref 4.22–5.81)
RDW: 15.8 % — ABNORMAL HIGH (ref 11.5–15.5)
WBC: 11.2 10*3/uL — ABNORMAL HIGH (ref 4.0–10.5)
nRBC: 0 % (ref 0.0–0.2)

## 2021-03-07 LAB — BASIC METABOLIC PANEL
Anion gap: 14 (ref 5–15)
BUN: 19 mg/dL (ref 8–23)
CO2: 28 mmol/L (ref 22–32)
Calcium: 9 mg/dL (ref 8.9–10.3)
Chloride: 94 mmol/L — ABNORMAL LOW (ref 98–111)
Creatinine, Ser: 0.79 mg/dL (ref 0.61–1.24)
GFR, Estimated: >60 mL/min (ref >60)
Glucose, Bld: 131 mg/dL — ABNORMAL HIGH (ref 70–99)
Potassium: 3.1 mmol/L — ABNORMAL LOW (ref 3.5–5.1)
Sodium: 136 mmol/L (ref 135–145)

## 2021-03-07 MED ORDER — POTASSIUM CHLORIDE CRYS ER 20 MEQ PO TBCR: 20.0000 meq | EXTENDED_RELEASE_TABLET | Freq: Once | ORAL | Status: DC

## 2021-03-07 MED ORDER — MORPHINE SULFATE (PF) 4 MG/ML IV SOLN
4.0000 mg | Freq: Once | INTRAVENOUS | Status: AC
  Administered 2021-03-07: 4 mg via INTRAVENOUS
  Filled 2021-03-07: qty 1

## 2021-03-07 NOTE — Progress Notes (Signed)
Orthopedic Tech Progress Note Patient Details:  STONEY KARCZEWSKI 1950-07-17 [id number]  Patient ID: Luna Glasgow, male   DOB: [birth year], 71 y.o.   MRN: [id number] Level II; not needed at the moment.  Darleen Crocker 03/07/2021, 3:36 PM
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
## 2021-03-07 NOTE — ED Triage Notes (Signed)
Per [facility] pt coming from [place] [facility]. Staff state pt was at nurses station in wheelchair when he started getting upset with staff and attempted to ambulate alone then fell forward. Pt on xarelto. According to staff pt is at his "new normal" mentation wise.

## 2021-03-07 NOTE — ED Notes (Addendum)
Unable to call report to [facility] [phone]), x 2 calls goes to VM at RN station.

## 2021-03-07 NOTE — ED Notes (Signed)
Patient transported to CT 

## 2021-03-07 NOTE — ED Notes (Signed)
Called ptar #2 on the list

## 2021-03-07 NOTE — Progress Notes (Signed)
Orthopedic Tech Progress Note Patient Details:  Kurt [name] May 27, 1950 [id number]  Patient ID: Kurt [name], male   DOB: 15-Jan-1951, 71 y.o.   MRN: [id number] Level II; not needed at the moment. Kurt [name] 03/07/2021, 3:33 PM
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
## 2021-03-07 NOTE — Discharge Instructions (Signed)
You were seen in the emergency department for evaluation of injuries from a fall.  You had a CAT scan of your head and neck that did not show any fractures or bleeding.  CAT scan of your pelvis did show a right buttock contusion.  Your lab work showed your potassium was low and you were given a dose of potassium.  Please return to the emergency department if any worsening or concerning symptoms.

## 2021-03-07 NOTE — ED Notes (Signed)
To ct and back

## 2021-06-06 ENCOUNTER — Other Ambulatory Visit: Payer: Self-pay | Admitting: Internal Medicine

## 2021-06-29 ENCOUNTER — Other Ambulatory Visit: Payer: Self-pay | Admitting: Student

## 2021-07-02 ENCOUNTER — Other Ambulatory Visit: Payer: Self-pay | Admitting: Orthopedic Surgery

## 2021-07-02 ENCOUNTER — Other Ambulatory Visit: Payer: Self-pay | Admitting: Pediatric Gastroenterology

## 2021-07-02 DIAGNOSIS — S32811D Multiple fractures of pelvis with unstable disruption of pelvic ring, subsequent encounter for fracture with routine healing: Secondary | ICD-10-CM

## 2021-07-11 IMAGING — CT CT ANGIO CHEST
2 of 7 series · 18 of 46 positions shown · IV contrast (APPLIED)
Comparison: 01/19/2019

CLINICAL DATA: Shortness of breath

EXAM:
CT ANGIOGRAPHY CHEST WITH CONTRAST
TECHNIQUE: Multidetector CT imaging of the chest was performed using the
standard protocol during bolus administration of intravenous
contrast. Multiplanar CT image reconstructions and MIPs were
obtained to evaluate the vascular anatomy.
CONTRAST:  75mL OMNIPAQUE IOHEXOL 350 MG/ML SOLN

[Series 7: thins · axial · 0.87mm/px · z∈[-129,+131]mm · 15 of 420 slices shown]
[im 24/420  lung]
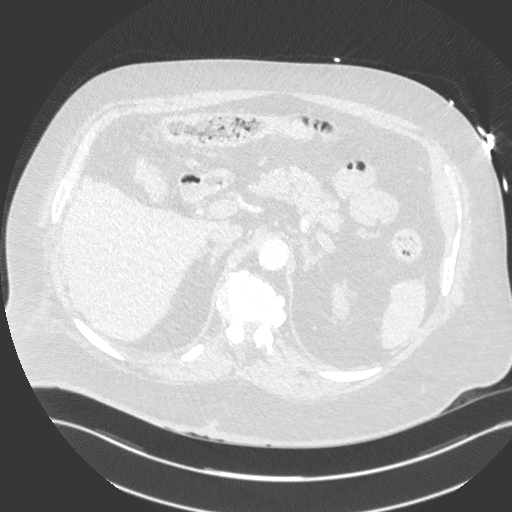
[im 47/420  soft-tissue]
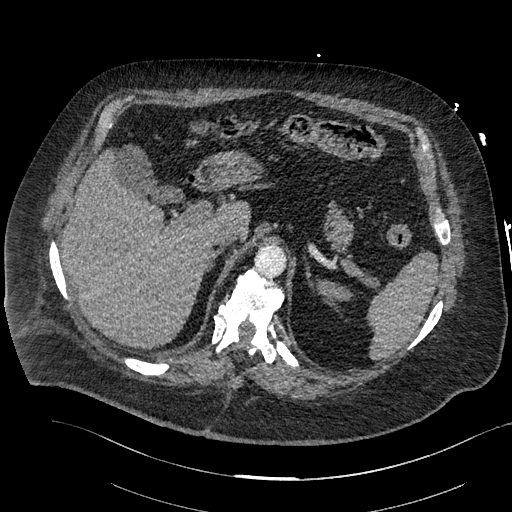
[im 70/420  lung]
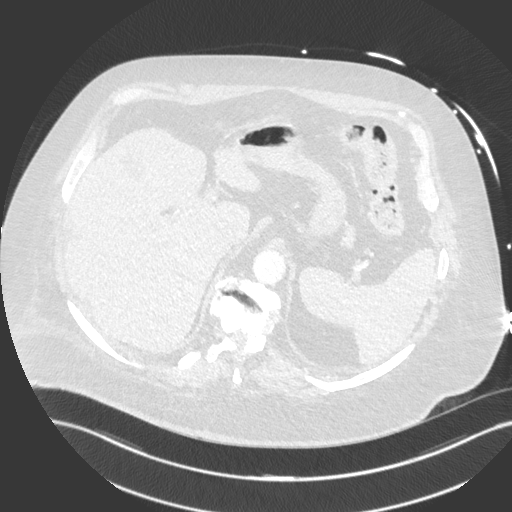
[im 94/420  soft-tissue]
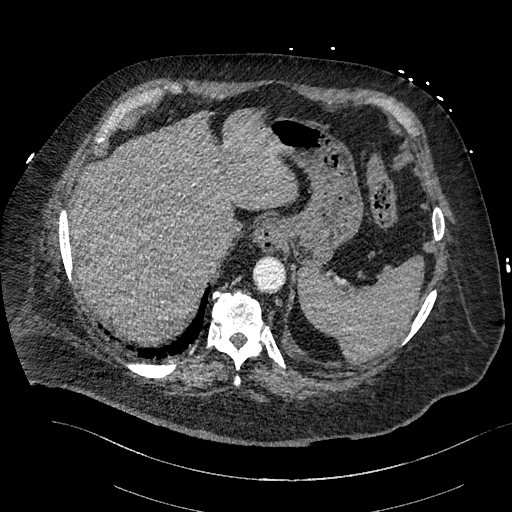
[im 140/420  lung]
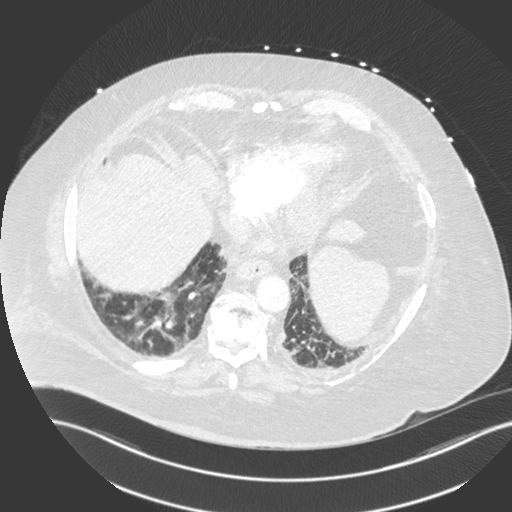
[im 163/420  soft-tissue]
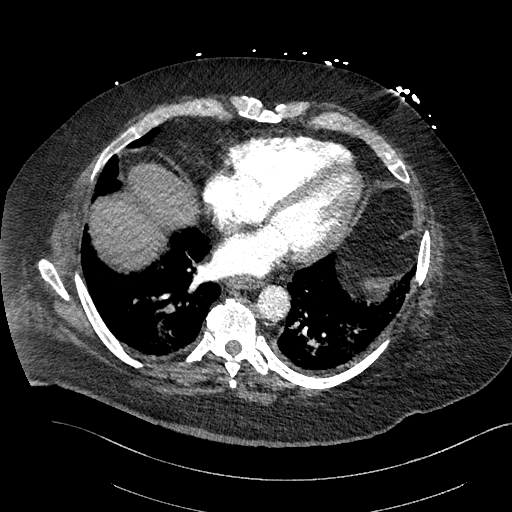
[im 187/420  lung]
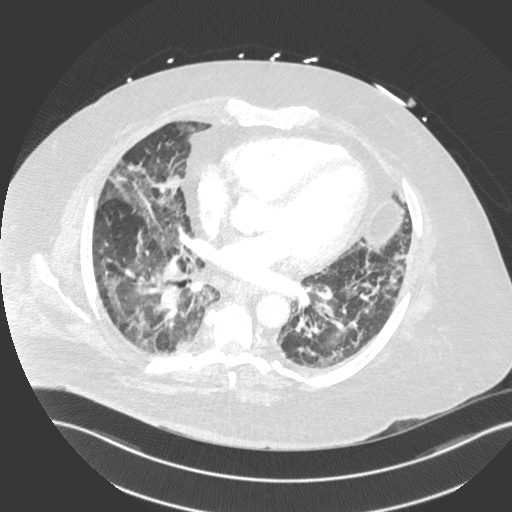
[im 210/420  soft-tissue]
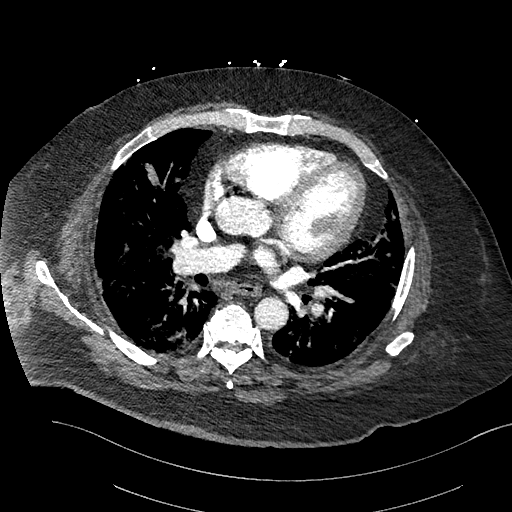
[im 233/420  lung]
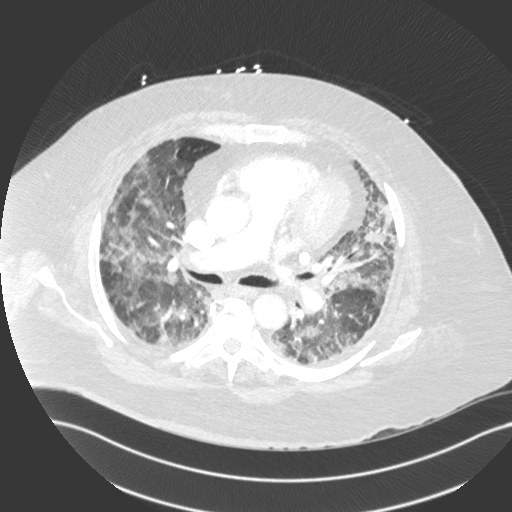
[im 257/420  soft-tissue]
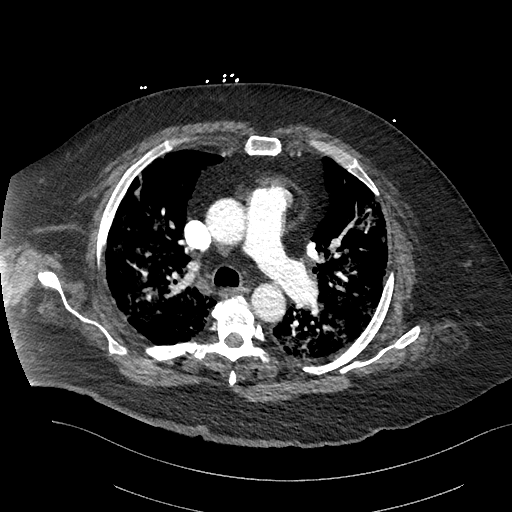
[im 280/420  lung]
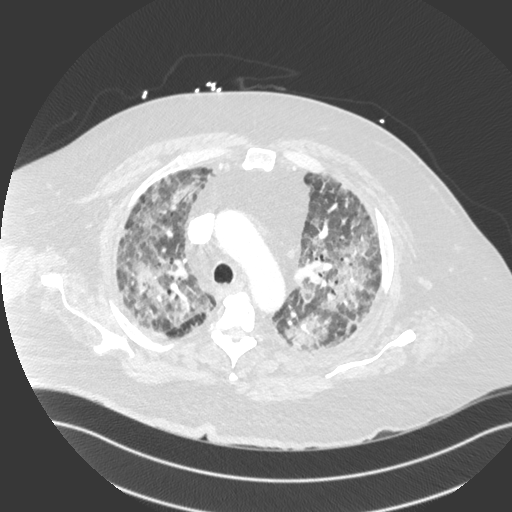
[im 326/420  soft-tissue]
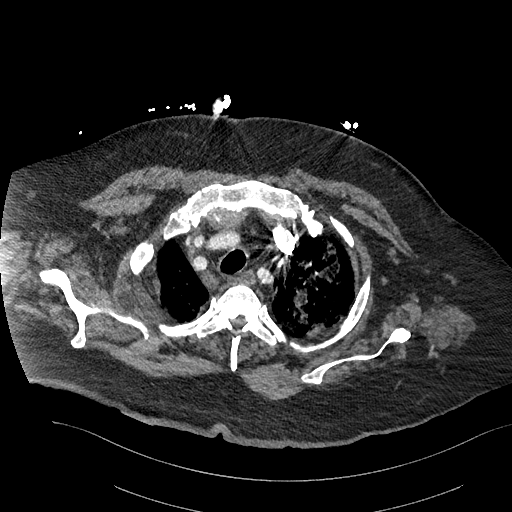
[im 350/420  lung]
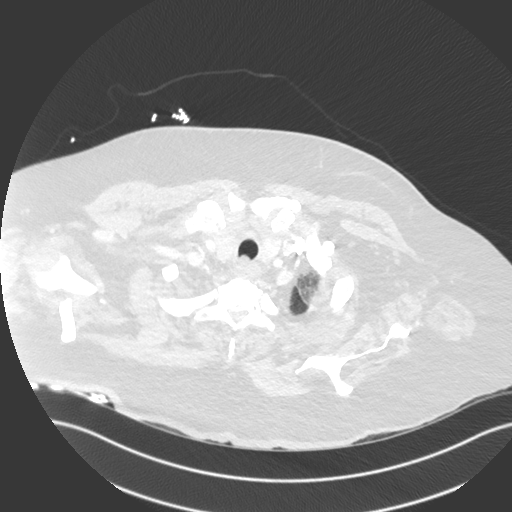
[im 373/420  soft-tissue]
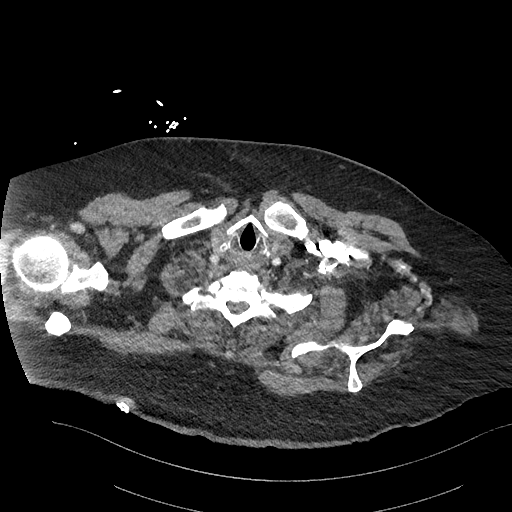
[im 396/420  lung]
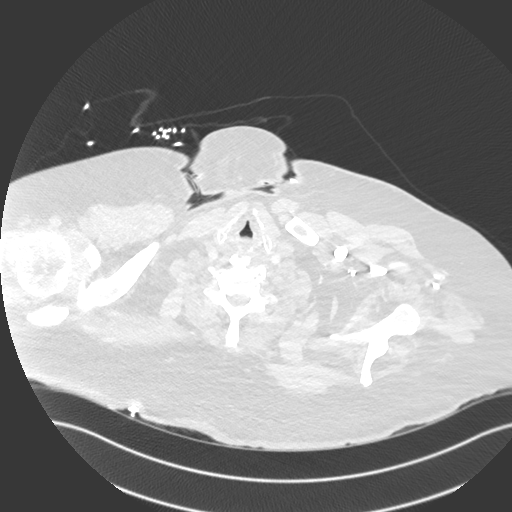

[Series 8: cor · coronal · 0.62mm/px · 3 of 173 slices shown]
[im 44/173  soft-tissue]
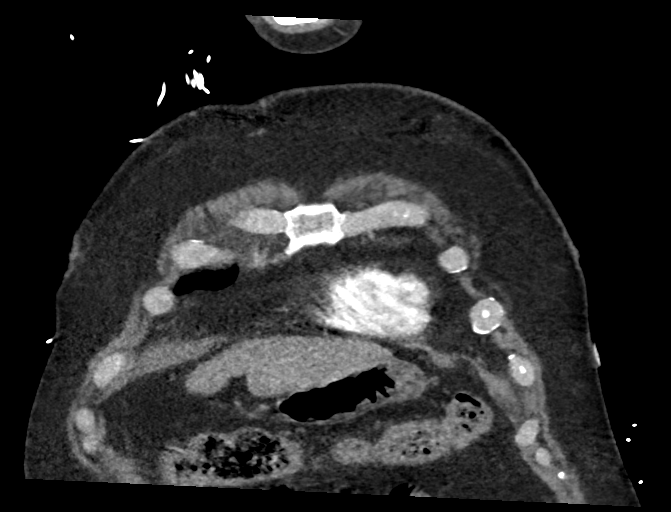
[im 87/173  soft-tissue]
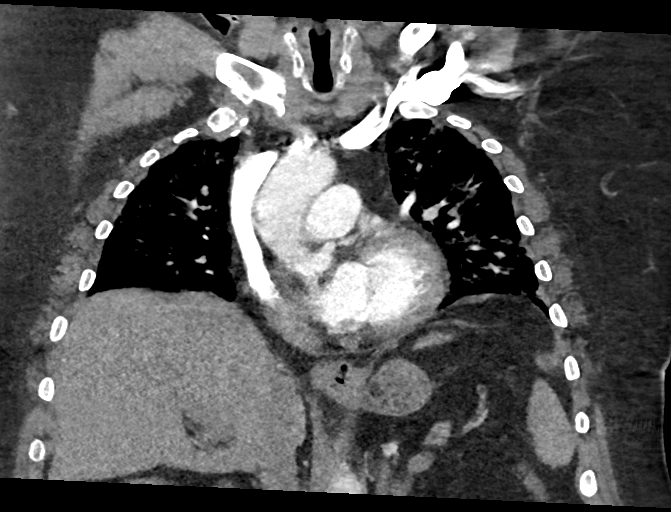
[im 130/173  soft-tissue]
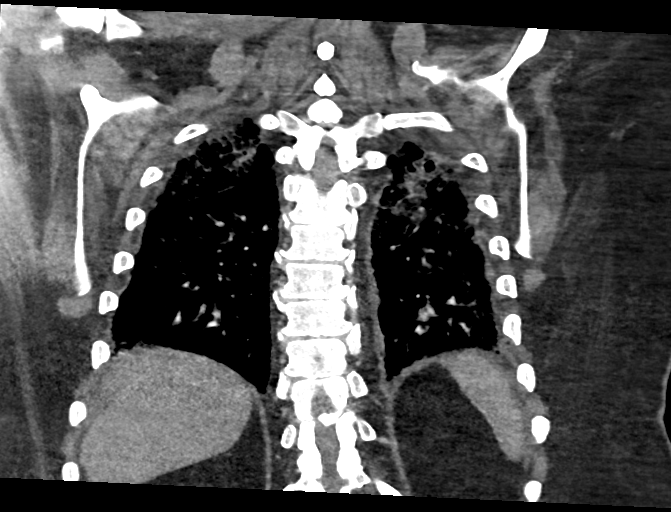

[18 of 46 positions shown; findings below may reference images not displayed]

FINDINGS: Cardiovascular: No filling defects in the pulmonary arteries to
suggest pulmonary emboli. Mild cardiomegaly. Aorta normal caliber.

Mediastinum/Nodes: Mildly prominent right paratracheal lymph nodes
with index node measuring 14 mm, stable since prior study. No
axillary or hilar adenopathy.

Lungs/Pleura: Extensive ground-glass airspace opacities throughout
both lungs, worsening since prior study. No effusions. 18 mm nodule
in the right middle lobe. This was present on prior study, partially
cavitary and measuring 21 mm at that time.

Upper Abdomen: Imaging into the upper abdomen shows no acute
findings.

Musculoskeletal: Chest wall soft tissues are unremarkable. No acute
bony abnormality.

Review of the MIP images confirms the above findings.
IMPRESSION: Worsening extensive bilateral ground-glass airspace opacities most
compatible with pneumonia.

18 mm nodule in the right middle lobe. This nodule was 21 mm on
prior study and partially cavitary.

Mildly enlarged right paratracheal lymph nodes, stable.

No evidence of pulmonary embolus.

Cardiomegaly.

## 2021-07-26 ENCOUNTER — Ambulatory Visit
Admission: RE | Admit: 2021-07-26 | Discharge: 2021-07-26 | Disposition: A | Discharge status: Home / Self Care | Payer: Medicare Other | Source: Ambulatory Visit | Attending: Orthopedic Surgery | Admitting: Orthopedic Surgery

## 2021-07-26 DIAGNOSIS — S32811D Multiple fractures of pelvis with unstable disruption of pelvic ring, subsequent encounter for fracture with routine healing: Secondary | ICD-10-CM

## 2021-11-01 ENCOUNTER — Other Ambulatory Visit (HOSPITAL_COMMUNITY): Payer: Self-pay | Admitting: Family Medicine

## 2021-11-01 ENCOUNTER — Other Ambulatory Visit: Payer: Self-pay | Admitting: Family Medicine

## 2021-11-01 ENCOUNTER — Ambulatory Visit
Admission: RE | Admit: 2021-11-01 | Discharge: 2021-11-01 | Disposition: A | Discharge status: Home / Self Care | Payer: Medicare (Managed Care) | Source: Ambulatory Visit | Attending: Family Medicine | Admitting: Family Medicine

## 2021-11-01 DIAGNOSIS — R3121 Asymptomatic microscopic hematuria: Secondary | ICD-10-CM | POA: Insufficient documentation

## 2021-11-06 ENCOUNTER — Other Ambulatory Visit: Payer: Self-pay | Admitting: Urology

## 2021-11-06 ENCOUNTER — Other Ambulatory Visit: Payer: Self-pay

## 2021-11-06 ENCOUNTER — Ambulatory Visit
Admission: RE | Admit: 2021-11-06 | Discharge: 2021-11-06 | Disposition: A | Discharge status: Home / Self Care | Payer: Medicare (Managed Care) | Source: Ambulatory Visit | Attending: Urology | Admitting: Urology

## 2021-11-06 ENCOUNTER — Encounter: Payer: Self-pay | Admitting: Urology

## 2021-11-06 ENCOUNTER — Ambulatory Visit
Admission: RE | Admit: 2021-11-06 | Discharge: 2021-11-06 | Disposition: A | Discharge status: Home / Self Care | Payer: Medicare (Managed Care) | Attending: Urology | Admitting: Urology

## 2021-11-06 ENCOUNTER — Ambulatory Visit (INDEPENDENT_AMBULATORY_CARE_PROVIDER_SITE_OTHER): Payer: Medicare Other | Admitting: Urology

## 2021-11-06 VITALS — BP 128/78 | HR 72

## 2021-11-06 DIAGNOSIS — N200 Calculus of kidney: Secondary | ICD-10-CM

## 2021-11-06 DIAGNOSIS — R3129 Other microscopic hematuria: Secondary | ICD-10-CM

## 2021-11-06 NOTE — Addendum Note (Signed)
Addended by: Amado Coe on: 11/06/2021 04:01 PM   Modules accepted: Orders

## 2021-11-06 NOTE — Progress Notes (Signed)
11/06/2021 12:30 PM   LEXON BEHLEN 08-18-1950 [id number]  Referring provider: Velta Addison, Claretha Cooper, DO [place],  [place] [address]  Chief Complaint  Patient presents with   New Patient (Initial Visit)   Calculus of Kidney    HPI: 71 year old male referred for further evaluation of an incidental right kidney stone.  He underwent a renal ultrasound on 11/01/2021 found to have a 1.6 cm calculus in the midportion of his right kidney without hydronephrosis.  Most recent creatinine 1.03  Urinalysis at his PCP showed greater than 30 RBCs but otherwise was unremarkable.  These labs were done on 10/30/2021.  Notably, the stone in question is chronic in the right renal pelvis.  It was last imaged in 2019 at which time it measured 1.2 cm.  Hounsfield units at the time 420.  He is having right flank pain intermittently for the last several weeks.  He has been working with physical therapy and when he moves around, this exacerbates his symptoms.  He denies any overt gross hematuria but has "dark" urine in the morning.  No clots.  No dysuria.  He did pass a small stone 30 years ago but none since.  PMH: Past Medical History:  Diagnosis Date   Arthritis    CHF (congestive heart failure) (HCC)    diastolic dysfunction   Chronic back pain    COVID-19    Depression    Diverticulitis    Duodenal ulcer 05/2018   Treated with 3 clips/Dr. Perry   Fall off scafolding 2003   prolonged hospitalization- multiple BUE surgery, concussion   GI bleed 05/2018   GIB (gastrointestinal bleeding) 2016   Antral ulcer/duodenal bleed per Endoscopy by Dr. Benson [place]   Hypertension    PTSD (post-traumatic stress disorder)    after  a Fall    Renal insufficiency    Decrease in GFR- PCP removed all NSAIDS & aspirin     Surgical History: Past Surgical History:  Procedure Laterality Date   BIOPSY  06/16/2018   Procedure: BIOPSY;  Surgeon: Irene Shipper, MD;  Location: [place];  Service:  Endoscopy;;   ELBOW FRACTURE SURGERY     ORIF, Shoulder manipulation     ESOPHAGOGASTRODUODENOSCOPY N/A 04/30/2014   Procedure: ESOPHAGOGASTRODUODENOSCOPY (EGD);  Surgeon: Carol Ada, MD;  Location[facility] ENDOSCOPY;  Service: Endoscopy;  Laterality: N/A;   ESOPHAGOGASTRODUODENOSCOPY (EGD) WITH PROPOFOL N/A 06/16/2018   Procedure: ESOPHAGOGASTRODUODENOSCOPY (EGD) WITH PROPOFOL;  Surgeon: Irene Shipper, MD;  Location: [facility] ENDOSCOPY;  Service: Endoscopy;  Laterality: N/A;   HEMOSTASIS CLIP PLACEMENT  06/16/2018   Procedure: HEMOSTASIS CLIP PLACEMENT;  Surgeon: Irene Shipper, MD;  Location: [facility] ENDOSCOPY;  Service: Endoscopy;;   LUMBAR LAMINECTOMY/DECOMPRESSION MICRODISCECTOMY N/A 02/16/2015   Procedure: Thoracic ten - thoracic twelve laminectomy;  Surgeon: Earnie Larsson, MD;  Location: [place] NEURO ORS;  Service: Neurosurgery;  Laterality: N/A;   LUMBAR LAMINECTOMY/DECOMPRESSION MICRODISCECTOMY N/A 03/22/2016   Procedure: Lumbar One-Two, Lumbar Two-Three, Lumbar Three-Four Laminectomy and Foraminotomy;  Surgeon: Earnie Larsson, MD;  Location: [place];  Service: Neurosurgery;  Laterality: N/A;   NASAL SEPTUM SURGERY     ORIF ACETABULAR FRACTURE Right 02/06/2021   Procedure: 1. OPEN REDUCTION INTERNAL FIXATION (ORIF) RIGHT  ACETABULAR FRACTURE USING STOPPA APPROACH 2. REMOVAL OF TRACTION PIN RIGHT TIBIA;  Surgeon: Altamese Crittenden, MD;  Location: [place];  Service: Orthopedics;  Laterality: Right;   SACRO-ILIAC PINNING Bilateral 02/06/2021   Procedure: 3.LEFT AND RIGHT (TRANS) SACROILIAC SCREW FIXATION;  Surgeon: Marcelino Scot,  Legrand Como, MD;  Location: [place];  Service: Orthopedics;  Laterality: Bilateral;   SCLEROTHERAPY  06/16/2018   Procedure: SCLEROTHERAPY;  Surgeon: Irene Shipper, MD;  Location: [facility] ENDOSCOPY;  Service: Endoscopy;;   VIDEO BRONCHOSCOPY Bilateral 02/18/2019   Procedure: VIDEO BRONCHOSCOPY WITHOUT FLUORO;  Surgeon: Margaretha Seeds, MD;  Location: [place];  Service: Pulmonary;  Laterality: Bilateral;   WOUND  EXPLORATION N/A 02/06/2021   Procedure: 4. APPLICATION OF  WOUND, SMALL;  Surgeon: Altamese Delevan, MD;  Location: [place];  Service: Orthopedics;  Laterality: N/A;    Home Medications:  Allergies as of 11/06/2021       Reactions   Lyrica [pregabalin] Other (See Comments)   Falls and balance problems        Medication List        Accurate as of November 06, 2021 12:30 PM. If you have any questions, ask your nurse or doctor.          acetaminophen 500 MG tablet Commonly known as: TYLENOL Take 2 tablets (1,000 mg total) by mouth every 6 (six) hours as needed for headache (pain).   albuterol 108 (90 Base) MCG/ACT inhaler Commonly known as: VENTOLIN HFA INHALE 2 PUFFS INTO THE LUNGS EVERY 4 (FOUR) HOURS AS NEEDED FOR WHEEZING OR SHORTNESS OF BREATH.   allopurinol 100 MG tablet Commonly known as: ZYLOPRIM Take 100 mg by mouth 2 (two) times a day.   amLODipine 2.5 MG tablet Commonly known as: NORVASC Take 2.5 mg by mouth daily.   atorvastatin 40 MG tablet Commonly known as: LIPITOR Take 40 mg by mouth daily.   busPIRone 10 MG tablet Commonly known as: BUSPAR Take 1 tablet (10 mg total) by mouth 3 (three) times daily.   carvedilol 3.125 MG tablet Commonly known as: COREG Take 1 tablet (3.125 mg total) by mouth 2 (two) times daily with a meal.   famotidine 20 MG tablet Commonly known as: PEPCID Take 20 mg by mouth daily.   ferrous sulfate 324 MG Tbec Take 324 mg by mouth daily with breakfast.   fluticasone 50 MCG/ACT nasal spray Commonly known as: FLONASE Place 2 sprays into both nostrils daily.   furosemide 80 MG tablet Commonly known as: LASIX Take 80 mg by mouth daily.   levETIRAcetam 1000 MG tablet Commonly known as: Keppra Take 1 tablet (1,000 mg total) by mouth 2 (two) times daily.   melatonin 3 MG Tabs tablet Take 2 tablets (6 mg total) by mouth at bedtime.   Methocarbamol 1000 MG Tabs Take 1,000 mg by mouth every 6 (six) hours as needed for muscle  spasms.   montelukast 10 MG tablet Commonly known as: SINGULAIR Take 1 tablet (10 mg total) by mouth at bedtime.   oxyCODONE 5 MG immediate release tablet Commonly known as: Oxy IR/ROXICODONE Take 1-2 tablets (5-10 mg total) by mouth every 4 (four) hours as needed for moderate pain.   pantoprazole 40 MG tablet Commonly known as: PROTONIX Take 1 tablet (40 mg total) by mouth 2 (two) times daily.   polyethylene glycol 17 g packet Commonly known as: MIRALAX / GLYCOLAX Take 17 g by mouth daily as needed for mild constipation.   polyethylene glycol 17 g packet Commonly known as: MIRALAX / GLYCOLAX Take 17 g by mouth daily as needed for mild constipation.   rivaroxaban 10 MG Tabs tablet Commonly known as: XARELTO Take 1 tablet (10 mg total) by mouth daily for 21 days.   sertraline 100 MG tablet Commonly known as: ZOLOFT  Take 2 tablets (200 mg total) by mouth every morning.        Allergies:  Allergies  Allergen Reactions   Lyrica [Pregabalin] Other (See Comments)    Falls and balance problems    Family History: No family history on file.  Social History:  reports that he has never smoked. He has never used smokeless tobacco. He reports that he does not currently use alcohol. He reports that he does not use drugs.   Physical Exam: BP 128/78   Pulse 72   Constitutional:  Alert and oriented, No acute distress.  In wheel chair.   HEENT: [place] AT, moist mucus membranes.  Trachea midline, no masses. Cardiovascular: No clubbing, cyanosis, or edema. Respiratory: Normal respiratory effort, no increased work of breathing. Neurologic: Grossly intact, no focal deficits, moving all 4 extremities. Psychiatric: Normal mood and affect.  Laboratory Data: Lab Results  Component Value Date   WBC 11.2 (H) 03/07/2021   HGB 14.1 03/07/2021   HCT 45.3 03/07/2021   MCV 92.1 03/07/2021   PLT 292 03/07/2021    Lab Results  Component Value Date   CREATININE 0.79 03/07/2021     Pertinent Imaging:   US RENAL  Narrative CLINICAL DATA:  Renal stones  EXAM: RENAL / URINARY TRACT ULTRASOUND COMPLETE  COMPARISON:  CT done on 09/09/2017  FINDINGS: Right Kidney:  Renal measurements: 10 x 5 x 6.4 cm = volume: 166. Five 5 mL. There is no hydronephrosis. There is 1.6 cm echogenic focus with acoustic shadowing in the medial midportion of right kidney.  Left Kidney:  Renal measurements: 11.2 x 4.2 x 5.8 cm = volume: 144.19 mL. There is slight prominence of renal pelvis without dilation of minor calices. There are no demonstrable renal stones. There is no perinephric fluid collection.  Bladder:  Appears normal for degree of bladder distention.  Other:  None.  IMPRESSION: There is no hydronephrosis. There is 1.6 cm calculus in the medial midportion of right kidney.   Electronically Signed By: Elmer Picker M.D. On: 11/01/2021 15:20  Renal ultrasound was personally reviewed and I also compared to his KUB today.  He can see the stone in question.  CT from 2019 also reviewed, same stone, outlined as above in HPI.  Assessment & Plan:    1. Right kidney stone 1.6 cm right renal pelvic stone, likely ball valving and now symptomatic despite its chronicity  Observation would be an option however he is adamant that this is the cause of his pain, discussed that the stone is fairly chronic, unclear why it may be hurting him now and the potential that treatment of his stone may not resolve the symptoms  We discussed various treatment options for urolithiasis including observation with or without medical expulsive therapy, shockwave lithotripsy (SWL), ureteroscopy and laser lithotripsy with stent placement, and percutaneous nephrolithotomy.   We discussed that management is based on stone size, location, density, patient co-morbidities, and patient preference.    SWL has a lower stone free rate in a single procedure, but also a lower complication  rate compared to ureteroscopy and avoids a stent and associated stent related symptoms. Possible complications include renal hematoma, steinstrasse, and need for additional treatment. We discussed the role of his increased skin to stone distance can lead to decreased efficacy with shockwave lithotripsy.   Ureteroscopy with laser lithotripsy and stent placement has a higher stone free rate than SWL in a single procedure, however increased complication rate including possible infection, ureteral injury, bleeding, and  stent related morbidity. Common stent related symptoms include dysuria, urgency/frequency, and flank pain.   After an extensive discussion of the risks and benefits of the above treatment options, the patient would like to proceed with right ESWL   2. Microscopic hematuria Greater than 30 red blood cells per high-powered field, consistent with #1  We will recheck a UA once the stone has been treated in order to determine whether or not he needs further hematuria evaluation    Hollice Espy, MD  [place] [address] [place], [place] [address] [phone]

## 2021-11-06 NOTE — H&P (View-Only) (Signed)
 11/06/2021 12:30 PM   Kurt [name] 11/04/1950 [id number]  Referring provider: Van Horn, Jill E, DO [address] [place],  [place] [address]  Chief Complaint  Patient presents with   New Patient (Initial Visit)   Calculus of Kidney    HPI: 71-year-old male referred for further evaluation of an incidental right kidney stone.  He underwent a renal ultrasound on 11/01/2021 found to have a 1.6 cm calculus in the midportion of his right kidney without hydronephrosis.  Most recent creatinine 1.03  Urinalysis at his PCP showed greater than 30 RBCs but otherwise was unremarkable.  These labs were done on 10/30/2021.  Notably, the stone in question is chronic in the right renal pelvis.  It was last imaged in 2019 at which time it measured 1.2 cm.  Hounsfield units at the time 420.  He is having right flank pain intermittently for the last several weeks.  He has been working with physical therapy and when he moves around, this exacerbates his symptoms.  He denies any overt gross hematuria but has "dark" urine in the morning.  No clots.  No dysuria.  He did pass a small stone 30 years ago but none since.  PMH: Past Medical History:  Diagnosis Date   Arthritis    CHF (congestive heart failure) (HCC)    diastolic dysfunction   Chronic back pain    COVID-19    Depression    Diverticulitis    Duodenal ulcer 05/2018   Treated with 3 clips/Dr. Perry   Fall off scafolding 2003   prolonged hospitalization- multiple BUE surgery, concussion   GI bleed 05/2018   GIB (gastrointestinal bleeding) 2016   Antral ulcer/duodenal bleed per Endoscopy by Dr. Hung   Hypertension    PTSD (post-traumatic stress disorder)    after  a Fall    Renal insufficiency    Decrease in GFR- PCP removed all NSAIDS & aspirin     Surgical History: Past Surgical History:  Procedure Laterality Date   BIOPSY  06/16/2018   Procedure: BIOPSY;  Surgeon: Perry, John N, MD;  Location: [facility] ENDOSCOPY;  Service:  Endoscopy;;   ELBOW FRACTURE SURGERY     ORIF, Shoulder manipulation     ESOPHAGOGASTRODUODENOSCOPY N/A 04/30/2014   Procedure: ESOPHAGOGASTRODUODENOSCOPY (EGD);  Surgeon: Patrick Hung, MD;  Location: [facility] ENDOSCOPY;  Service: Endoscopy;  Laterality: N/A;   ESOPHAGOGASTRODUODENOSCOPY (EGD) WITH PROPOFOL N/A 06/16/2018   Procedure: ESOPHAGOGASTRODUODENOSCOPY (EGD) WITH PROPOFOL;  Surgeon: Perry, John N, MD;  Location: [facility] ENDOSCOPY;  Service: Endoscopy;  Laterality: N/A;   HEMOSTASIS CLIP PLACEMENT  06/16/2018   Procedure: HEMOSTASIS CLIP PLACEMENT;  Surgeon: Perry, John N, MD;  Location: [facility] ENDOSCOPY;  Service: Endoscopy;;   LUMBAR LAMINECTOMY/DECOMPRESSION MICRODISCECTOMY N/A 02/16/2015   Procedure: Thoracic ten - thoracic twelve laminectomy;  Surgeon: Henry Pool, MD;  Location: [facility] NEURO ORS;  Service: Neurosurgery;  Laterality: N/A;   LUMBAR LAMINECTOMY/DECOMPRESSION MICRODISCECTOMY N/A 03/22/2016   Procedure: Lumbar One-Two, Lumbar Two-Three, Lumbar Three-Four Laminectomy and Foraminotomy;  Surgeon: Henry Pool, MD;  Location: [facility] OR;  Service: Neurosurgery;  Laterality: N/A;   NASAL SEPTUM SURGERY     ORIF ACETABULAR FRACTURE Right 02/06/2021   Procedure: 1. OPEN REDUCTION INTERNAL FIXATION (ORIF) RIGHT  ACETABULAR FRACTURE USING STOPPA APPROACH 2. REMOVAL OF TRACTION PIN RIGHT TIBIA;  Surgeon: Handy, Michael, MD;  Location: [facility];  Service: Orthopedics;  Laterality: Right;   SACRO-ILIAC PINNING Bilateral 02/06/2021   Procedure: 3.LEFT AND RIGHT (TRANS) SACROILIAC SCREW FIXATION;  Surgeon: Handy,   Michael, MD;  Location: [facility] OR;  Service: Orthopedics;  Laterality: Bilateral;   SCLEROTHERAPY  06/16/2018   Procedure: SCLEROTHERAPY;  Surgeon: Perry, John N, MD;  Location: [facility] ENDOSCOPY;  Service: Endoscopy;;   VIDEO BRONCHOSCOPY Bilateral 02/18/2019   Procedure: VIDEO BRONCHOSCOPY WITHOUT FLUORO;  Surgeon: Ellison, Chi Jane, MD;  Location: [facility] ENDOSCOPY;  Service: Pulmonary;  Laterality: Bilateral;   WOUND  EXPLORATION N/A 02/06/2021   Procedure: 4. APPLICATION OF  WOUND, SMALL;  Surgeon: Handy, Michael, MD;  Location: [facility] OR;  Service: Orthopedics;  Laterality: N/A;    Home Medications:  Allergies as of 11/06/2021       Reactions   Lyrica [pregabalin] Other (See Comments)   Falls and balance problems        Medication List        Accurate as of November 06, 2021 12:30 PM. If you have any questions, ask your nurse or doctor.          acetaminophen 500 MG tablet Commonly known as: TYLENOL Take 2 tablets (1,000 mg total) by mouth every 6 (six) hours as needed for headache (pain).   albuterol 108 (90 Base) MCG/ACT inhaler Commonly known as: VENTOLIN HFA INHALE 2 PUFFS INTO THE LUNGS EVERY 4 (FOUR) HOURS AS NEEDED FOR WHEEZING OR SHORTNESS OF BREATH.   allopurinol 100 MG tablet Commonly known as: ZYLOPRIM Take 100 mg by mouth 2 (two) times a day.   amLODipine 2.5 MG tablet Commonly known as: NORVASC Take 2.5 mg by mouth daily.   atorvastatin 40 MG tablet Commonly known as: LIPITOR Take 40 mg by mouth daily.   busPIRone 10 MG tablet Commonly known as: BUSPAR Take 1 tablet (10 mg total) by mouth 3 (three) times daily.   carvedilol 3.125 MG tablet Commonly known as: COREG Take 1 tablet (3.125 mg total) by mouth 2 (two) times daily with a meal.   famotidine 20 MG tablet Commonly known as: PEPCID Take 20 mg by mouth daily.   ferrous sulfate 324 MG Tbec Take 324 mg by mouth daily with breakfast.   fluticasone 50 MCG/ACT nasal spray Commonly known as: FLONASE Place 2 sprays into both nostrils daily.   furosemide 80 MG tablet Commonly known as: LASIX Take 80 mg by mouth daily.   levETIRAcetam 1000 MG tablet Commonly known as: Keppra Take 1 tablet (1,000 mg total) by mouth 2 (two) times daily.   melatonin 3 MG Tabs tablet Take 2 tablets (6 mg total) by mouth at bedtime.   Methocarbamol 1000 MG Tabs Take 1,000 mg by mouth every 6 (six) hours as needed for muscle  spasms.   montelukast 10 MG tablet Commonly known as: SINGULAIR Take 1 tablet (10 mg total) by mouth at bedtime.   oxyCODONE 5 MG immediate release tablet Commonly known as: Oxy IR/ROXICODONE Take 1-2 tablets (5-10 mg total) by mouth every 4 (four) hours as needed for moderate pain.   pantoprazole 40 MG tablet Commonly known as: PROTONIX Take 1 tablet (40 mg total) by mouth 2 (two) times daily.   polyethylene glycol 17 g packet Commonly known as: MIRALAX / GLYCOLAX Take 17 g by mouth daily as needed for mild constipation.   polyethylene glycol 17 g packet Commonly known as: MIRALAX / GLYCOLAX Take 17 g by mouth daily as needed for mild constipation.   rivaroxaban 10 MG Tabs tablet Commonly known as: XARELTO Take 1 tablet (10 mg total) by mouth daily for 21 days.   sertraline 100 MG tablet Commonly known as: ZOLOFT   Take 2 tablets (200 mg total) by mouth every morning.        Allergies:  Allergies  Allergen Reactions   Lyrica [Pregabalin] Other (See Comments)    Falls and balance problems    Family History: No family history on file.  Social History:  reports that he has never smoked. He has never used smokeless tobacco. He reports that he does not currently use alcohol. He reports that he does not use drugs.   Physical Exam: BP 128/78   Pulse 72   Constitutional:  Alert and oriented, No acute distress.  In wheel chair.   HEENT: [place] AT, moist mucus membranes.  Trachea midline, no masses. Cardiovascular: No clubbing, cyanosis, or edema. Respiratory: Normal respiratory effort, no increased work of breathing. Neurologic: Grossly intact, no focal deficits, moving all 4 extremities. Psychiatric: Normal mood and affect.  Laboratory Data: Lab Results  Component Value Date   WBC 11.2 (H) 03/07/2021   HGB 14.1 03/07/2021   HCT 45.3 03/07/2021   MCV 92.1 03/07/2021   PLT 292 03/07/2021    Lab Results  Component Value Date   CREATININE 0.79 03/07/2021     Pertinent Imaging:   US RENAL  Narrative CLINICAL DATA:  Renal stones  EXAM: RENAL / URINARY TRACT ULTRASOUND COMPLETE  COMPARISON:  CT done on 09/09/2017  FINDINGS: Right Kidney:  Renal measurements: 10 x 5 x 6.4 cm = volume: 166. Five 5 mL. There is no hydronephrosis. There is 1.6 cm echogenic focus with acoustic shadowing in the medial midportion of right kidney.  Left Kidney:  Renal measurements: 11.2 x 4.2 x 5.8 cm = volume: 144.19 mL. There is slight prominence of renal pelvis without dilation of minor calices. There are no demonstrable renal stones. There is no perinephric fluid collection.  Bladder:  Appears normal for degree of bladder distention.  Other:  None.  IMPRESSION: There is no hydronephrosis. There is 1.6 cm calculus in the medial midportion of right kidney.   Electronically Signed By: Palani  Rathinasamy M.D. On: 11/01/2021 15:20  Renal ultrasound was personally reviewed and I also compared to his KUB today.  He can see the stone in question.  CT from 2019 also reviewed, same stone, outlined as above in HPI.  Assessment & Plan:    1. Right kidney stone 1.6 cm right renal pelvic stone, likely ball valving and now symptomatic despite its chronicity  Observation would be an option however he is adamant that this is the cause of his pain, discussed that the stone is fairly chronic, unclear why it may be hurting him now and the potential that treatment of his stone may not resolve the symptoms  We discussed various treatment options for urolithiasis including observation with or without medical expulsive therapy, shockwave lithotripsy (SWL), ureteroscopy and laser lithotripsy with stent placement, and percutaneous nephrolithotomy.   We discussed that management is based on stone size, location, density, patient co-morbidities, and patient preference.    SWL has a lower stone free rate in a single procedure, but also a lower complication  rate compared to ureteroscopy and avoids a stent and associated stent related symptoms. Possible complications include renal hematoma, steinstrasse, and need for additional treatment. We discussed the role of his increased skin to stone distance can lead to decreased efficacy with shockwave lithotripsy.   Ureteroscopy with laser lithotripsy and stent placement has a higher stone free rate than SWL in a single procedure, however increased complication rate including possible infection, ureteral injury, bleeding, and   stent related morbidity. Common stent related symptoms include dysuria, urgency/frequency, and flank pain.   After an extensive discussion of the risks and benefits of the above treatment options, the patient would like to proceed with right ESWL   2. Microscopic hematuria Greater than 30 red blood cells per high-powered field, consistent with #1  We will recheck a UA once the stone has been treated in order to determine whether or not he needs further hematuria evaluation    Hollice Espy, MD  [place] [address] [place], [place] [address] [phone]

## 2021-11-06 NOTE — Progress Notes (Signed)
ESWL ORDER FORM  Expected date of procedure: 11/08/2021  Surgeon: Nickolas Madrid, MD  Post op standing: 2-4wk follow up w/KUB prior  Anticoagulation/Aspirin/NSAID standing order: Hold all 72 hours prior  Anesthesia standing order: MAC  VTE standing: SCD's  Dx: Right Nephrolihtiasis  Procedure: right Extracorporeal shock wave lithotripsy  CPT : 28003  Standing Order Set:   *NPO after mn, KUB  *NS 143m/hr, Keflex 5071mPO, Benadryl 2517mO, Valium 43m68m, Zofran 4mg 22m   Medications if other than standing orders:   NONE

## 2021-11-08 ENCOUNTER — Other Ambulatory Visit: Payer: Self-pay

## 2021-11-08 ENCOUNTER — Encounter: Payer: Self-pay | Admitting: Urology

## 2021-11-08 ENCOUNTER — Ambulatory Visit
Admission: RE | Admit: 2021-11-08 | Discharge: 2021-11-08 | Disposition: A | Discharge status: Home / Self Care | Payer: Medicare (Managed Care) | Attending: Urology | Admitting: Urology

## 2021-11-08 ENCOUNTER — Encounter
Admission: RE | Disposition: A | Discharge status: Home / Self Care | Payer: Self-pay | Source: Home / Self Care | Attending: Urology

## 2021-11-08 ENCOUNTER — Ambulatory Visit: Payer: Medicare (Managed Care)

## 2021-11-08 DIAGNOSIS — N200 Calculus of kidney: Secondary | ICD-10-CM | POA: Diagnosis present

## 2021-11-08 DIAGNOSIS — I110 Hypertensive heart disease with heart failure: Secondary | ICD-10-CM | POA: Insufficient documentation

## 2021-11-08 DIAGNOSIS — I509 Heart failure, unspecified: Secondary | ICD-10-CM | POA: Insufficient documentation

## 2021-11-08 HISTORY — PX: EXTRACORPOREAL SHOCK WAVE LITHOTRIPSY: SHX1557

## 2021-11-08 SURGERY — LITHOTRIPSY, ESWL
Anesthesia: Moderate Sedation | Laterality: Right

## 2021-11-08 MED ORDER — CEPHALEXIN 500 MG PO CAPS
ORAL_CAPSULE | ORAL | Status: AC
  Administered 2021-11-08: 500 mg via ORAL
  Filled 2021-11-08: qty 1

## 2021-11-08 MED ORDER — CEPHALEXIN 500 MG PO CAPS: 500.0000 mg | ORAL_CAPSULE | Freq: Once | ORAL | Status: AC

## 2021-11-08 MED ORDER — OXYCODONE HCL 5 MG PO TABS: 5.0000 mg | ORAL_TABLET | Freq: Four times a day (QID) | ORAL | 0 refills | Status: DC | PRN

## 2021-11-08 MED ORDER — DIAZEPAM 5 MG PO TABS: 10.0000 mg | ORAL_TABLET | ORAL | Status: AC

## 2021-11-08 MED ORDER — DIAZEPAM 5 MG PO TABS
ORAL_TABLET | ORAL | Status: AC
  Administered 2021-11-08: 10 mg via ORAL
  Filled 2021-11-08: qty 2

## 2021-11-08 MED ORDER — TAMSULOSIN HCL 0.4 MG PO CAPS: 0.4000 mg | ORAL_CAPSULE | Freq: Every day | ORAL | 0 refills | Status: DC

## 2021-11-08 MED ORDER — ONDANSETRON HCL 4 MG/2ML IJ SOLN: 4.0000 mg | Freq: Once | INTRAMUSCULAR | Status: AC

## 2021-11-08 MED ORDER — DIPHENHYDRAMINE HCL 25 MG PO CAPS: 25.0000 mg | ORAL_CAPSULE | ORAL | Status: AC

## 2021-11-08 MED ORDER — ONDANSETRON HCL 4 MG/2ML IJ SOLN
INTRAMUSCULAR | Status: AC
  Administered 2021-11-08: 4 mg via INTRAVENOUS
  Filled 2021-11-08: qty 2

## 2021-11-08 MED ORDER — TAMSULOSIN HCL 0.4 MG PO CAPS: 0.4000 mg | ORAL_CAPSULE | Freq: Every day | ORAL | 0 refills | Status: AC

## 2021-11-08 MED ORDER — OXYCODONE HCL 5 MG PO TABS: 5.0000 mg | ORAL_TABLET | Freq: Four times a day (QID) | ORAL | 0 refills | Status: AC | PRN

## 2021-11-08 MED ORDER — DIPHENHYDRAMINE HCL 25 MG PO CAPS
ORAL_CAPSULE | ORAL | Status: AC
  Administered 2021-11-08: 25 mg via ORAL
  Filled 2021-11-08: qty 1

## 2021-11-08 MED ORDER — SODIUM CHLORIDE 0.9 % IV SOLN: INTRAVENOUS | Status: DC

## 2021-11-08 NOTE — Discharge Instructions (Signed)

## 2021-11-08 NOTE — Brief Op Note (Signed)
11/08/2021  8:47 AM  PATIENT:  Kurt [name]  71 y.o. male  PRE-OPERATIVE DIAGNOSIS:  Right 1.3cm renal stone  POST-OPERATIVE DIAGNOSIS:  Same  PROCEDURE:  Procedure(s): EXTRACORPOREAL SHOCK WAVE LITHOTRIPSY (ESWL) (Right)  SURGEON:  Surgeon(s) and Role:    Billey Co, MD - Primary  ANESTHESIA: Conscious Sedation  EBL:  None  Drains: None  Specimen: None  Findings:  Stone appeared to smudge significantly, tolerated SWL very well  DISPO: Flomax, pain meds PRN, RTC 2 weeks KUB  Nickolas Madrid, MD 11/08/2021

## 2021-11-08 NOTE — Interval H&P Note (Signed)
History and Physical Interval Note:  11/08/2021 8:01 AM  Kurt [name]  has presented today for surgery, with the diagnosis of Right Nephrolithiasis.  The various methods of treatment have been discussed with the patient and family. After consideration of risks, benefits and other options for treatment, the patient has consented to  Procedure(s): EXTRACORPOREAL SHOCK WAVE LITHOTRIPSY (ESWL) (Right) as a surgical intervention.  The patient's history has been reviewed, patient examined, no change in status, stable for surgery.  I have reviewed the patient's chart and labs.  Questions were answered to the patient's satisfaction.    1 cm right renal stone, intermittent right flank pain.  UA with PCP >30 RBCs, no other abnormalities.  Billey Co

## 2021-11-09 ENCOUNTER — Encounter: Payer: Self-pay | Admitting: Urology

## 2021-11-15 ENCOUNTER — Encounter: Payer: Self-pay | Admitting: Urology

## 2021-11-29 ENCOUNTER — Ambulatory Visit
Admission: RE | Admit: 2021-11-29 | Discharge: 2021-11-29 | Disposition: A | Discharge status: Home / Self Care | Payer: Medicare (Managed Care) | Attending: Urology | Admitting: Urology

## 2021-11-29 ENCOUNTER — Ambulatory Visit
Admission: RE | Admit: 2021-11-29 | Discharge: 2021-11-29 | Disposition: A | Discharge status: Home / Self Care | Payer: Medicare (Managed Care) | Source: Ambulatory Visit | Attending: Urology | Admitting: Urology

## 2021-11-29 ENCOUNTER — Ambulatory Visit (INDEPENDENT_AMBULATORY_CARE_PROVIDER_SITE_OTHER): Payer: Medicare (Managed Care) | Admitting: Physician Assistant

## 2021-11-29 VITALS — BP 160/86 | HR 64 | Ht 70.0 in | Wt 208.0 lb

## 2021-11-29 DIAGNOSIS — N200 Calculus of kidney: Secondary | ICD-10-CM | POA: Diagnosis present

## 2021-11-29 DIAGNOSIS — R3129 Other microscopic hematuria: Secondary | ICD-10-CM | POA: Diagnosis not present

## 2021-11-29 LAB — URINALYSIS, COMPLETE
Bilirubin, UA: NEGATIVE
Glucose, UA: NEGATIVE
Ketones, UA: NEGATIVE
Leukocytes,UA: NEGATIVE
Nitrite, UA: NEGATIVE
Protein,UA: NEGATIVE
Specific Gravity, UA: 1.015 (ref 1.005–1.030)
Urobilinogen, Ur: 1 mg/dL (ref 0.2–1.0)
pH, UA: 5.5 (ref 5.0–7.5)

## 2021-11-29 LAB — MICROSCOPIC EXAMINATION
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
## 2021-12-10 ENCOUNTER — Telehealth: Payer: Self-pay | Admitting: Physician Assistant

## 2021-12-10 DIAGNOSIS — N200 Calculus of kidney: Secondary | ICD-10-CM

## 2021-12-10 NOTE — Telephone Encounter (Signed)
Please call this patient and let him know that there was a mixup on the day I saw him in clinic. The urinalysis I reviewed for his visit appears to have belonged to someone else; I was ultimately able to view his correct urinalysis and he DOES continue to have microscopic blood in his urine. Additionally, radiology feels that he may have had a stone fragment in his right ureter at the time I saw him.  I sincerely apologize for the mixup. I recommend bringing him back in about a month after his last visit for a follow-up with a repeat KUB and UA to see if 1) he's passed the ureteral fragment and 2) if the blood in his urine has resolved. If he continues to have bleeding in the urine or if he has retained ureteral fragments, he may need further intervention. He may return to clinic sooner if symptomatic.

## 2021-12-11 NOTE — Telephone Encounter (Signed)
.  left message to have patient return my call. Orders placed

## 2021-12-11 NOTE — Telephone Encounter (Signed)
.  left message to have patient return my call.  

## 2021-12-17 NOTE — Telephone Encounter (Signed)
Left message at home and VM

## 2021-12-19 NOTE — Telephone Encounter (Signed)
I called the patient and also reached him VM, did not leave a message. I LMOM for his daughter Kennyth Arnold to ask him to call me. I was also able to speak with his sister Jasmine December via telephone and gave her our clinic number asking her to have him call us. She agreed.

## 2022-02-10 ENCOUNTER — Emergency Department
Admission: EM | Admit: 2022-02-10 | Discharge: 2022-02-10 | Disposition: A | Discharge status: Home / Self Care | Payer: Medicare (Managed Care) | Attending: Emergency Medicine | Admitting: Emergency Medicine

## 2022-02-10 ENCOUNTER — Emergency Department: Payer: Medicare (Managed Care)

## 2022-02-10 DIAGNOSIS — N400 Enlarged prostate without lower urinary tract symptoms: Secondary | ICD-10-CM | POA: Insufficient documentation

## 2022-02-10 DIAGNOSIS — W050XXA Fall from non-moving wheelchair, initial encounter: Secondary | ICD-10-CM | POA: Insufficient documentation

## 2022-02-10 DIAGNOSIS — S0990XA Unspecified injury of head, initial encounter: Secondary | ICD-10-CM

## 2022-02-10 DIAGNOSIS — Z8673 Personal history of transient ischemic attack (TIA), and cerebral infarction without residual deficits: Secondary | ICD-10-CM | POA: Insufficient documentation

## 2022-02-10 DIAGNOSIS — M545 Low back pain, unspecified: Secondary | ICD-10-CM | POA: Insufficient documentation

## 2022-02-10 DIAGNOSIS — I509 Heart failure, unspecified: Secondary | ICD-10-CM | POA: Insufficient documentation

## 2022-02-10 DIAGNOSIS — N200 Calculus of kidney: Secondary | ICD-10-CM | POA: Insufficient documentation

## 2022-02-10 DIAGNOSIS — W19XXXA Unspecified fall, initial encounter: Secondary | ICD-10-CM

## 2022-02-10 DIAGNOSIS — K5730 Diverticulosis of large intestine without perforation or abscess without bleeding: Secondary | ICD-10-CM | POA: Diagnosis not present

## 2022-02-10 LAB — BASIC METABOLIC PANEL
Anion gap: 12 (ref 5–15)
BUN: 21 mg/dL (ref 8–23)
CO2: 29 mmol/L (ref 22–32)
Calcium: 8.8 mg/dL — ABNORMAL LOW (ref 8.9–10.3)
Chloride: 96 mmol/L — ABNORMAL LOW (ref 98–111)
Creatinine, Ser: 0.9 mg/dL (ref 0.61–1.24)
GFR, Estimated: >60 mL/min (ref >60)
Glucose, Bld: 101 mg/dL — ABNORMAL HIGH (ref 70–99)
Potassium: 4 mmol/L (ref 3.5–5.1)
Sodium: 137 mmol/L (ref 135–145)

## 2022-02-10 LAB — CBC
HCT: 43.5 % (ref 39.0–52.0)
Hemoglobin: 13.9 g/dL (ref 13.0–17.0)
MCH: 29.1 pg (ref 26.0–34.0)
MCHC: 32 g/dL (ref 30.0–36.0)
MCV: 91.2 fL (ref 80.0–100.0)
Platelets: 234 10*3/uL (ref 150–400)
RBC: 4.77 MIL/uL (ref 4.22–5.81)
RDW: 13 % (ref 11.5–15.5)
WBC: 7.5 10*3/uL (ref 4.0–10.5)
nRBC: 0 % (ref 0.0–0.2)

## 2022-02-10 LAB — TROPONIN I (HIGH SENSITIVITY)
Troponin I (High Sensitivity): 6 ng/L (ref <18)
Troponin I (High Sensitivity): 6 ng/L (ref <18)

## 2022-02-10 MED ORDER — ACETAMINOPHEN 500 MG PO TABS: 1000.0000 mg | ORAL_TABLET | Freq: Once | ORAL | Status: DC

## 2022-02-10 MED ORDER — KETOROLAC TROMETHAMINE 15 MG/ML IJ SOLN: 30.0000 mg | Freq: Once | INTRAMUSCULAR | Status: DC

## 2022-02-10 MED ORDER — TRAMADOL HCL 50 MG PO TABS: 50.0000 mg | ORAL_TABLET | Freq: Once | ORAL | Status: DC

## 2022-02-10 MED ORDER — MUSCLE RUB 10-15 % EX CREA: 1.0000 | TOPICAL_CREAM | CUTANEOUS | 0 refills | Status: DC | PRN

## 2022-02-10 NOTE — ED Notes (Signed)
Pt was given permission to take home meds. By Dr. Jacelyn Grip.

## 2022-02-10 NOTE — ED Triage Notes (Addendum)
Pt family sts that pt fell this AM and did not get hurt and than went to church. Pt than fell this afternoon again and now has a laceration to the upper right eye and a scratch on his left cheek. Pt sts that he stood up and felt dizzy. Pt denies any LOC. Pt is not on blood thinners.
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
## 2022-02-10 NOTE — Discharge Instructions (Addendum)
Take acetaminophen 650 mg every 6 hours for pain.  Take with food.  Continue rehab and follow-up with your regular doctor for ongoing management of your back pain and injury.  Thank you for choosing [facility] for your health care today!  Please see your primary doctor this week for a follow up appointment.   Sometimes, in the early stages of certain disease courses it is difficult to detect in the emergency department evaluation -- so, it is important that you continue to monitor your symptoms and call your doctor right away or return to the emergency department if you develop any new or worsening symptoms.  Please go to the following website to schedule new (and existing) patient appointments:   [email or web address]  If you do not have a primary doctor try calling the following clinics to establish care:  If you have insurance:  [facility] [phone] [place] [address]   [facility]  [phone] [place] [place] [address]   If you do not have insurance:  [facility]  [phone] [address] [place] [address]   The following is another list of primary care offices in the area who are accepting new patients at this time.  Please reach out to one of them directly and let them know you would like to schedule an appointment to follow up on an Emergency Department visit, and/or to establish a new primary care provider (PCP).  There are likely other primary care clinics in the are who are accepting new patients, but this is an excellent place to start:  [place] [place] physician: Dr Lavon Paganini [address] [place], [place] [address] [phone]  [facility] Lead Physician: Dr Steele Sizer [address], [place], [place] [address] [phone]  [place] Physician: Dr Park Liter [address] [place], [place] [address] [phone]  [facility] Lead Physician: Dr Dewaine O[address] [place], [place], [place] [address] [phone]  [facility] Physician: Dr Halina Maidens [address] [place], [place] [address] [phone]   It was my pleasure to care for you today.   [place] Brunette Jacelyn Grip, MD

## 2022-02-10 NOTE — ED Notes (Signed)
ED Provider at bedside. 

## 2022-02-14 ENCOUNTER — Other Ambulatory Visit: Payer: Self-pay | Admitting: Family Medicine

## 2022-02-14 DIAGNOSIS — N200 Calculus of kidney: Secondary | ICD-10-CM

## 2022-03-02 ENCOUNTER — Inpatient Hospital Stay
Admission: EM | Admit: 2022-03-02 | Discharge: 2022-03-04 | DRG: 378 | Disposition: A | Discharge status: Home / Self Care | Payer: Medicare (Managed Care) | Attending: Internal Medicine | Admitting: Internal Medicine

## 2022-03-02 DIAGNOSIS — M549 Dorsalgia, unspecified: Secondary | ICD-10-CM | POA: Diagnosis present

## 2022-03-02 DIAGNOSIS — G4733 Obstructive sleep apnea (adult) (pediatric): Secondary | ICD-10-CM | POA: Diagnosis present

## 2022-03-02 DIAGNOSIS — F4310 Post-traumatic stress disorder, unspecified: Secondary | ICD-10-CM | POA: Diagnosis present

## 2022-03-02 DIAGNOSIS — K922 Gastrointestinal hemorrhage, unspecified: Secondary | ICD-10-CM | POA: Diagnosis not present

## 2022-03-02 DIAGNOSIS — I10 Essential (primary) hypertension: Secondary | ICD-10-CM | POA: Diagnosis present

## 2022-03-02 DIAGNOSIS — Z8673 Personal history of transient ischemic attack (TIA), and cerebral infarction without residual deficits: Secondary | ICD-10-CM

## 2022-03-02 DIAGNOSIS — I509 Heart failure, unspecified: Secondary | ICD-10-CM

## 2022-03-02 DIAGNOSIS — Z8616 Personal history of COVID-19: Secondary | ICD-10-CM

## 2022-03-02 DIAGNOSIS — I110 Hypertensive heart disease with heart failure: Secondary | ICD-10-CM | POA: Diagnosis present

## 2022-03-02 DIAGNOSIS — Z8619 Personal history of other infectious and parasitic diseases: Secondary | ICD-10-CM

## 2022-03-02 DIAGNOSIS — Z87442 Personal history of urinary calculi: Secondary | ICD-10-CM

## 2022-03-02 DIAGNOSIS — Z888 Allergy status to other drugs, medicaments and biological substances status: Secondary | ICD-10-CM

## 2022-03-02 DIAGNOSIS — K269 Duodenal ulcer, unspecified as acute or chronic, without hemorrhage or perforation: Secondary | ICD-10-CM

## 2022-03-02 DIAGNOSIS — K264 Chronic or unspecified duodenal ulcer with hemorrhage: Principal | ICD-10-CM | POA: Diagnosis present

## 2022-03-02 DIAGNOSIS — I5032 Chronic diastolic (congestive) heart failure: Secondary | ICD-10-CM | POA: Diagnosis present

## 2022-03-02 DIAGNOSIS — D62 Acute posthemorrhagic anemia: Secondary | ICD-10-CM | POA: Diagnosis present

## 2022-03-02 DIAGNOSIS — K319 Disease of stomach and duodenum, unspecified: Secondary | ICD-10-CM

## 2022-03-02 DIAGNOSIS — Z79899 Other long term (current) drug therapy: Secondary | ICD-10-CM

## 2022-03-02 DIAGNOSIS — G8929 Other chronic pain: Secondary | ICD-10-CM | POA: Diagnosis present

## 2022-03-02 HISTORY — DX: Gastrointestinal hemorrhage, unspecified: K92.2

## 2022-03-02 LAB — TYPE AND SCREEN

## 2022-03-02 LAB — CBC WITH DIFFERENTIAL/PLATELET
Abs Immature Granulocytes: 0.03 10*3/uL (ref 0.00–0.07)
Basophils Absolute: 0.1 10*3/uL (ref 0.0–0.1)
Basophils Relative: 1 %
Eosinophils Absolute: 0.4 10*3/uL (ref 0.0–0.5)
Eosinophils Relative: 5 %
HCT: 28.7 % — ABNORMAL LOW (ref 39.0–52.0)
Hemoglobin: 9.3 g/dL — ABNORMAL LOW (ref 13.0–17.0)
Immature Granulocytes: 0 %
Lymphocytes Relative: 15 %
Lymphs Abs: 1.4 10*3/uL (ref 0.7–4.0)
MCH: 29.9 pg (ref 26.0–34.0)
MCHC: 32.4 g/dL (ref 30.0–36.0)
MCV: 92.3 fL (ref 80.0–100.0)
Monocytes Absolute: 0.7 10*3/uL (ref 0.1–1.0)
Monocytes Relative: 7 %
Neutro Abs: 6.6 10*3/uL (ref 1.7–7.7)
Neutrophils Relative %: 72 %
Platelets: 280 10*3/uL (ref 150–400)
RBC: 3.11 MIL/uL — ABNORMAL LOW (ref 4.22–5.81)
RDW: 13.9 % (ref 11.5–15.5)
WBC: 9.3 10*3/uL (ref 4.0–10.5)
nRBC: 0 % (ref 0.0–0.2)

## 2022-03-02 LAB — COMPREHENSIVE METABOLIC PANEL
ALT: 12 U/L (ref 0–44)
AST: 20 U/L (ref 15–41)
Albumin: 3.6 g/dL (ref 3.5–5.0)
Alkaline Phosphatase: 74 U/L (ref 38–126)
Anion gap: 7 (ref 5–15)
BUN: 55 mg/dL — ABNORMAL HIGH (ref 8–23)
CO2: 29 mmol/L (ref 22–32)
Calcium: 8.5 mg/dL — ABNORMAL LOW (ref 8.9–10.3)
Chloride: 101 mmol/L (ref 98–111)
Creatinine, Ser: 1.15 mg/dL (ref 0.61–1.24)
GFR, Estimated: >60 mL/min (ref >60)
Glucose, Bld: 112 mg/dL — ABNORMAL HIGH (ref 70–99)
Potassium: 3.9 mmol/L (ref 3.5–5.1)
Sodium: 137 mmol/L (ref 135–145)
Total Bilirubin: 0.8 mg/dL (ref 0.3–1.2)
Total Protein: 6.8 g/dL (ref 6.5–8.1)

## 2022-03-02 LAB — PROTIME-INR
INR: 1.1 (ref 0.8–1.2)
Prothrombin Time: 14.5 seconds (ref 11.4–15.2)

## 2022-03-02 MED ORDER — PREGABALIN 25 MG PO CAPS
25.0000 mg | ORAL_CAPSULE | Freq: Two times a day (BID) | ORAL | Status: DC
  Administered 2022-03-03 – 2022-03-04 (×3): 25 mg via ORAL
  Filled 2022-03-02 (×3): qty 1

## 2022-03-02 MED ORDER — ONDANSETRON HCL 4 MG PO TABS: 4.0000 mg | ORAL_TABLET | Freq: Four times a day (QID) | ORAL | Status: DC | PRN

## 2022-03-02 MED ORDER — ATORVASTATIN CALCIUM 20 MG PO TABS
40.0000 mg | ORAL_TABLET | Freq: Every day | ORAL | Status: DC
  Administered 2022-03-03 – 2022-03-04 (×2): 40 mg via ORAL
  Filled 2022-03-02 (×2): qty 2

## 2022-03-02 MED ORDER — ALBUTEROL SULFATE (2.5 MG/3ML) 0.083% IN NEBU: 2.5000 mg | INHALATION_SOLUTION | RESPIRATORY_TRACT | Status: DC | PRN

## 2022-03-02 MED ORDER — TRAZODONE HCL 50 MG PO TABS
50.0000 mg | ORAL_TABLET | Freq: Every day | ORAL | Status: DC
  Administered 2022-03-02 – 2022-03-03 (×2): 50 mg via ORAL
  Filled 2022-03-02 (×2): qty 1

## 2022-03-02 MED ORDER — PANTOPRAZOLE INFUSION (NEW) - SIMPLE MED
8.0000 mg/h | INTRAVENOUS | Status: DC
  Administered 2022-03-02: 8 mg/h via INTRAVENOUS
  Filled 2022-03-02: qty 100

## 2022-03-02 MED ORDER — ALLOPURINOL 100 MG PO TABS
100.0000 mg | ORAL_TABLET | Freq: Two times a day (BID) | ORAL | Status: DC
  Administered 2022-03-03 – 2022-03-04 (×3): 100 mg via ORAL
  Filled 2022-03-02 (×3): qty 1

## 2022-03-02 MED ORDER — ONDANSETRON HCL 4 MG/2ML IJ SOLN: 4.0000 mg | Freq: Four times a day (QID) | INTRAMUSCULAR | Status: DC | PRN

## 2022-03-02 MED ORDER — ACETAMINOPHEN 325 MG PO TABS
650.0000 mg | ORAL_TABLET | Freq: Four times a day (QID) | ORAL | Status: DC | PRN
  Administered 2022-03-03 – 2022-03-04 (×3): 650 mg via ORAL
  Filled 2022-03-02 (×3): qty 2

## 2022-03-02 MED ORDER — PANTOPRAZOLE SODIUM 40 MG IV SOLR
40.0000 mg | Freq: Two times a day (BID) | INTRAVENOUS | Status: DC
  Administered 2022-03-03 – 2022-03-04 (×3): 40 mg via INTRAVENOUS
  Filled 2022-03-02 (×3): qty 10

## 2022-03-02 MED ORDER — LACTATED RINGERS IV BOLUS
1000.0000 mL | Freq: Once | INTRAVENOUS | Status: AC
  Administered 2022-03-02: 1000 mL via INTRAVENOUS

## 2022-03-02 MED ORDER — SODIUM CHLORIDE 0.9% FLUSH
3.0000 mL | Freq: Two times a day (BID) | INTRAVENOUS | Status: DC
  Administered 2022-03-03 – 2022-03-04 (×3): 3 mL via INTRAVENOUS

## 2022-03-02 MED ORDER — BUSPIRONE HCL 10 MG PO TABS
15.0000 mg | ORAL_TABLET | Freq: Every day | ORAL | Status: DC
  Administered 2022-03-03 – 2022-03-04 (×2): 15 mg via ORAL
  Filled 2022-03-02: qty 2
  Filled 2022-03-02: qty 3

## 2022-03-02 MED ORDER — PANTOPRAZOLE 80MG IVPB - SIMPLE MED
80.0000 mg | Freq: Once | INTRAVENOUS | Status: AC
  Administered 2022-03-02: 80 mg via INTRAVENOUS
  Filled 2022-03-02: qty 100

## 2022-03-02 MED ORDER — SERTRALINE HCL 50 MG PO TABS
200.0000 mg | ORAL_TABLET | Freq: Every morning | ORAL | Status: DC
  Administered 2022-03-03 – 2022-03-04 (×2): 200 mg via ORAL
  Filled 2022-03-02 (×2): qty 4

## 2022-03-02 MED ORDER — ACETAMINOPHEN 650 MG RE SUPP: 650.0000 mg | Freq: Four times a day (QID) | RECTAL | Status: DC | PRN

## 2022-03-02 MED ORDER — ALBUTEROL SULFATE HFA 108 (90 BASE) MCG/ACT IN AERS: 2.0000 | INHALATION_SPRAY | RESPIRATORY_TRACT | Status: DC | PRN

## 2022-03-02 NOTE — Assessment & Plan Note (Signed)
-   CPAP at bedtime 

## 2022-03-02 NOTE — Assessment & Plan Note (Signed)
Per chart review, patient has a history of duodenal ulcers requiring intervention in 2016 and 2020, felt to be secondary to NSAID use.  Currently presenting with 3 days of melena with acute drop in hemoglobin from 13.9 to 9.3 in 2 weeks.  Patient is unusually normotensive at this time, given per chart review he is typically hypertensive with systolics around 0000000.  Will plan to consult GI and make n.p.o. after midnight in case intervention is planned.  - GI consulted; appreciate their recommendations - Clear liquid diet - N.p.o. after midnight - Protonix IV twice daily - CBC every 8 hours - Transfuse for hemoglobin less than 7 or hemodynamic instability

## 2022-03-02 NOTE — Assessment & Plan Note (Signed)
Given relative hypotension compared to previous measurements, will hold home antihypertensives at this time.  - Restart antihypertensives when blood pressure stabilizes

## 2022-03-02 NOTE — ED Notes (Signed)
First nurse note:  NP from PCP office called to inform ED that pt has hx duodenal hmmg and presents with red and black stool since 3 days. She faxed over chart summary. Her name and number are Willette Cluster, [phone].

## 2022-03-02 NOTE — ED Provider Notes (Signed)
[facility] Provider Note    Event Date/Time   First MD Initiated Contact with Patient 03/02/22 1936     (approximate)   History   Melena (Patient reports 3 days of dark stool; History of duodenal ulcer with hemorrhage)   HPI  Kurt [name] is a 72 y.o. male with a history of CHF, PTSD, TIA, chronic back pain, and prior GI bleed who presents with 2 days of dark stools which looks similar to when he had melena from the upper GI bleed previously.  He reports generalized fatigue for about the last 3 to 4 days.  He denies any associated nausea or vomiting and has no abdominal pain.  He has no frank blood in the stool.  I reviewed the past medical records.  The patient had an upper endoscopy in May 2020 by Dr. Henrene Pastor which showed a bleeding duodenal ulcer.  Patient reports at that time he was heavily using NSAIDs.  He states he took a few Aleve a couple weeks ago but has otherwise not been using NSAIDs recently.   Physical Exam   Triage Vital Signs: ED Triage Vitals  Enc Vitals Group     BP 03/02/22 1834 (!) 122/93     Pulse Rate 03/02/22 1834 94     Resp 03/02/22 1834 19     Temp 03/02/22 1834 98.9 F (37.2 C)     Temp Source 03/02/22 1834 Oral     SpO2 03/02/22 1834 98 %     Weight 03/02/22 1833 215 lb (97.5 kg)     Height 03/02/22 1833 5' 10"$  (1.778 m)     Head Circumference --      Peak Flow --      Pain Score --      Pain Loc --      Pain Edu? --      Excl. in [place]? --     Most recent vital signs: Vitals:   03/02/22 2000 03/02/22 2030  BP: 116/67 120/72  Pulse: 82 80  Resp: 18   Temp:    SpO2: 100%      General: Awake, no distress.  CV:  Good peripheral perfusion.  Resp:  Normal effort.  Abd:  Soft and nontender.  No distention.  Other:  Black tarry appearing stool on DRE.  No hemorrhoids.   ED Results / Procedures / Treatments   Labs (all labs ordered are listed, but only abnormal results are displayed) Labs Reviewed  CBC WITH  DIFFERENTIAL/PLATELET - Abnormal; Notable for the following components:      Result Value   RBC 3.11 (*)    Hemoglobin 9.3 (*)    HCT 28.7 (*)    All other components within normal limits  COMPREHENSIVE METABOLIC PANEL - Abnormal; Notable for the following components:   Glucose, Bld 112 (*)    BUN 55 (*)    Calcium 8.5 (*)    All other components within normal limits  PROTIME-INR  CBC  CBC  BASIC METABOLIC PANEL  TYPE AND SCREEN  TYPE AND SCREEN     EKG     RADIOLOGY     PROCEDURES:  Critical Care performed: No  Procedures   MEDICATIONS ORDERED IN ED: Medications  sodium chloride flush (NS) 0.9 % injection 3 mL (3 mLs Intravenous Not Given 03/02/22 2212)  acetaminophen (TYLENOL) tablet 650 mg (has no administration in time range)    Or  acetaminophen (TYLENOL) suppository 650 mg (has no administration in time  range)  ondansetron (ZOFRAN) tablet 4 mg (has no administration in time range)    Or  ondansetron (ZOFRAN) injection 4 mg (has no administration in time range)  pantoprazole (PROTONIX) injection 40 mg (has no administration in time range)  atorvastatin (LIPITOR) tablet 40 mg (has no administration in time range)  busPIRone (BUSPAR) tablet 15 mg (has no administration in time range)  allopurinol (ZYLOPRIM) tablet 100 mg (has no administration in time range)  pregabalin (LYRICA) capsule 25 mg (has no administration in time range)  sertraline (ZOLOFT) tablet 200 mg (has no administration in time range)  traZODone (DESYREL) tablet 50 mg (50 mg Oral Given 03/02/22 2253)  albuterol (PROVENTIL) (2.5 MG/3ML) 0.083% nebulizer solution 2.5 mg (has no administration in time range)  pantoprazole (PROTONIX) 80 mg /NS 100 mL IVPB (0 mg Intravenous Stopped 03/02/22 2158)  lactated ringers bolus 1,000 mL (1,000 mLs Intravenous New Bag/Given 03/02/22 2132)     IMPRESSION / MDM / ASSESSMENT AND PLAN / ED COURSE  I reviewed the triage vital signs and the nursing  notes.  72 year old male with a prior history of duodenal bleed presents with melena and generalized fatigue for the last several days.  Exam confirms guaiac positive stool.  However, the patient is overall relatively well-appearing and has normal vital signs.  Differential diagnosis includes, but is not limited to, recurrent duodenal bleed, other upper GI bleed, less likely lower GI bleed.  Patient's presentation is most consistent with acute presentation with potential threat to life or bodily function.  Initial lab workup shows a hemoglobin of 9.3 which is down from 13.9 a few weeks ago.  CMP shows normal creatinine but elevated BUN.  Given the hemoglobin drop, the patient will need admission for further monitoring and treatment.  I have started him on a Protonix infusion.  ----------------------------------------- 9:15 PM on 03/02/2022 -----------------------------------------  I consulted Dr. Charleen Kirks from the hospitalist service; based on discussion she agrees to admit the patient.   FINAL CLINICAL IMPRESSION(S) / ED DIAGNOSES   Final diagnoses:  Upper GI bleed     Rx / DC Orders   ED Discharge Orders     None        Note:  This document was prepared using Dragon voice recognition software and may include unintentional dictation errors.    Arta Silence, MD 03/02/22 2256

## 2022-03-02 NOTE — Assessment & Plan Note (Signed)
Patient appears euvolemic at this time, however given volume loss with GI bleed, will hold home Lasix and give IV fluids.  -Holding home Lasix

## 2022-03-02 NOTE — ED Triage Notes (Signed)
Patient reports 3 days of dark stool; History of duodenal ulcer with hemorrhage

## 2022-03-02 NOTE — H&P (Signed)
History and Physical    Patient: Kurt [name] [id number] DOB: January 16, 1951 DOA: 03/02/2022 DOS: the patient was seen and examined on 03/02/2022 PCP: Velta Addison, Claretha Cooper, DO  Patient coming from: Home  Chief Complaint:  Chief Complaint  Patient presents with   Melena    Patient reports 3 days of dark stool; History of duodenal ulcer with hemorrhage   HPI: Kurt [name] is a 72 y.o. male with medical history significant of duodenal ulcers, hypertension, HFpEF, nephrolithiasis, who presents to the ED with complaints of dark stool.  [name] states that for the past 2 to 3 days, he has been feeling generally unwell but has not experienced any abdominal pain, nausea, vomiting, dizziness, chest pain, palpitations or shortness of breath.  About 2 days ago, he noticed that his bowel movements turned a black tarry color.  He only had 1 episode of black stool yesterday, however he experienced 4 episodes today thus far.  He states that this is similar to his prior episodes of GI bleed, however his symptoms are less severe.  He denies any recent aspirin or NSAID use.  ED course: On arrival to the ED, patient was normotensive at 122/93 with heart rate of 94.  He was saturating at 98% on room air.  He was afebrile at 98.9.  Initial workup remarkable for hemoglobin of 9.3, previously 13.9 approximately 2 weeks ago, platelets of 280, glucose 112, BUN 55, creatinine 1.15 with GFR above 60.  Due to suspected upper GI bleed, [facility] contacted for admission  Review of Systems: As mentioned in the history of present illness. All other systems reviewed and are negative.  Past Medical History:  Diagnosis Date   Arthritis    CHF (congestive heart failure) (HCC)    diastolic dysfunction   Chronic back pain    COVID-19    Depression    Diverticulitis    Duodenal ulcer 05/2018   Treated with 3 clips/Dr. Perry   Fall off scafolding 2003   prolonged hospitalization- multiple BUE surgery, concussion   GI bleed  05/2018   GIB (gastrointestinal bleeding) 2016   Antral ulcer/duodenal bleed per Endoscopy by Dr. Benson Norway   Hypertension    PTSD (post-traumatic stress disorder)    after  a Fall    Renal insufficiency    Decrease in GFR- PCP removed all NSAIDS & aspirin    Past Surgical History:  Procedure Laterality Date   BIOPSY  06/16/2018   Procedure: BIOPSY;  Surgeon: Irene Shipper, MD;  Location: [facility] ENDOSCOPY;  Service: Endoscopy;;   ELBOW FRACTURE SURGERY     ORIF, Shoulder manipulation     ESOPHAGOGASTRODUODENOSCOPY N/A 04/30/2014   Procedure: ESOPHAGOGASTRODUODENOSCOPY (EGD);  Surgeon: Carol Ada, MD;  [facility] END[facility]COPY;  Service: Endoscopy;  Laterality: N/A;   ESOPHAGOGASTRODUODENOSCOPY (EGD) WITH PROPOFOL N/A 06/16/2018   Procedure: ESOPHAGOGASTRODUODENOSCOPY (EGD) WITH PROPOFOL;  Surgeon: Irene Shipper, MD;  Location: [facility] ENDOSCOPY;  Service: Endoscopy;  Laterality: N/A;   EXTRACORPOREAL SHOCK WAVE LITHOTRIPSY Right 11/08/2021   Procedure: EXTRACORPOREAL SHOCK WAVE LITHOTRIPSY (ESWL);  Surgeon: Billey Co, MD;  Location: [facility] ORS;  Service: Urology;  Laterality: Right;   HEMOSTASIS CLIP PLACEMENT  06/16/2018   Procedure: HEMOSTASIS CLIP PLACEMENT;  Surgeon: Irene Shipper, MD;  Location: [facility] ENDOSCOPY;  Service: Endoscopy;;   LUMBAR LAMINECTOMY/DECOMPRESSION MICRODISCECTOMY N/A 02/16/2015   Procedure: Thoracic ten - thoracic twelve laminectomy;  Surgeon: Earnie Larsson, MD;  Location: [place] NEURO ORS;  Service: Neurosurgery;  Laterality: N/A;  LUMBAR LAMINECTOMY/DECOMPRESSION MICRODISCECTOMY N/A 03/22/2016   Procedure: Lumbar One-Two, Lumbar Two-Three, Lumbar Three-Four Laminectomy and Foraminotomy;  Surgeon: Earnie Larsson, MD;  Location: [place];  Service: Neurosurgery;  Laterality: N/A;   NASAL SEPTUM SURGERY     ORIF ACETABULAR FRACTURE Right 02/06/2021   Procedure: 1. OPEN REDUCTION INTERNAL FIXATION (ORIF) RIGHT  ACETABULAR FRACTURE USING STOPPA APPROACH 2. REMOVAL OF TRACTION PIN RIGHT TIBIA;   Surgeon: Altamese [place], MD;  Location: [place];  Service: Orthopedics;  Laterality: Right;   SACRO-ILIAC PINNING Bilateral 02/06/2021   Procedure: 3.LEFT AND RIGHT (TRANS) SACROILIAC SCREW FIXATION;  Surgeon: Altamese [place], MD;  Location: [place];  Service: Orthopedics;  Laterality: Bilateral;   SCLEROTHERAPY  06/16/2018   Procedure: SCLEROTHERAPY;  Surgeon: Irene Shipper, MD;  Location: [facility] ENDOSCOPY;  Service: Endoscopy;;   VIDEO BRONCHOSCOPY Bilateral 02/18/2019   Procedure: VIDEO BRONCHOSCOPY WITHOUT FLUORO;  Surgeon: Margaretha Seeds, MD;  Location: [place];  Service: Pulmonary;  Laterality: Bilateral;   WOUND EXPLORATION N/A 02/06/2021   Procedure: 4. APPLICATION OF  WOUND, SMALL;  Surgeon: Altamese [place], MD;  Location: [place];  Service: Orthopedics;  Laterality: N/A;   Social History:  reports that he has never smoked. He has never used smokeless tobacco. He reports that he does not currently use alcohol. He reports that he does not use drugs.  Allergies  Allergen Reactions   Lyrica [Pregabalin] Other (See Comments)    Falls and balance problems    No family history on file.  Prior to Admission medications   Medication Sig Start Date End Date Taking? Authorizing Provider  acetaminophen (TYLENOL) 500 MG tablet Take 2 tablets (1,000 mg total) by mouth every 6 (six) hours as needed for headache (pain). 02/26/21   Saverio Danker, PA-C  albuterol (VENTOLIN HFA) 108 (90 Base) MCG/ACT inhaler INHALE 2 PUFFS INTO THE LUNGS EVERY 4 (FOUR) HOURS AS NEEDED FOR WHEEZING OR SHORTNESS OF BREATH. 07/25/20   Magdalen Spatz, NP  allopurinol (ZYLOPRIM) 100 MG tablet Take 100 mg by mouth 2 (two) times a day. 05/16/18   [provider]  amLODipine (NORVASC) 2.5 MG tablet Take 2.5 mg by mouth daily. 02/01/19   [provider]  atorvastatin (LIPITOR) 40 MG tablet Take 40 mg by mouth daily. 10/30/20   [provider]  busPIRone (BUSPAR) 10 MG tablet Take 1 tablet (10 mg total) by mouth  3 (three) times daily. 02/26/21   Saverio Danker, PA-C  carvedilol (COREG) 3.125 MG tablet Take 1 tablet (3.125 mg total) by mouth 2 (two) times daily with a meal. 03/12/19   Love, Ivan Anchors, PA-C  famotidine (PEPCID) 20 MG tablet Take 20 mg by mouth daily. 01/30/21   [provider]  ferrous sulfate 324 MG TBEC Take 324 mg by mouth daily with breakfast.    [provider]  fluticasone (FLONASE) 50 MCG/ACT nasal spray Place 2 sprays into both nostrils daily. 01/23/19   Allie Bossier, MD  furosemide (LASIX) 80 MG tablet Take 80 mg by mouth daily. 12/09/20   [provider]  levETIRAcetam (KEPPRA) 1000 MG tablet Take 1 tablet (1,000 mg total) by mouth 2 (two) times daily. 06/18/19   Lovorn, Jinny Blossom, MD  Melatonin 3 MG TABS Take 2 tablets (6 mg total) by mouth at bedtime. 03/12/19   Love, Ivan Anchors, PA-C  Menthol-Methyl Salicylate (MUSCLE RUB) 10-15 % CREA Apply 1 Application topically as needed for muscle pain. 02/10/22   Lucillie Garfinkel, MD  methocarbamol 1000 MG TABS Take  1,000 mg by mouth every 6 (six) hours as needed for muscle spasms. 02/26/21   Saverio Danker, PA-C  montelukast (SINGULAIR) 10 MG tablet Take 1 tablet (10 mg total) by mouth at bedtime. 03/12/19   Love, Ivan Anchors, PA-C  oxyCODONE (OXY IR/ROXICODONE) 5 MG immediate release tablet Take 1-2 tablets (5-10 mg total) by mouth every 4 (four) hours as needed for moderate pain. 02/26/21   Saverio Danker, PA-C  pantoprazole (PROTONIX) 40 MG tablet Take 1 tablet (40 mg total) by mouth 2 (two) times daily. 03/12/19   Love, Ivan Anchors, PA-C  polyethylene glycol (MIRALAX / GLYCOLAX) 17 g packet Take 17 g by mouth daily as needed for mild constipation. 01/23/19   Allie Bossier, MD  sertraline (ZOLOFT) 100 MG tablet Take 2 tablets (200 mg total) by mouth every morning. 03/12/19   Love, Ivan Anchors, PA-C  tamsulosin (FLOMAX) 0.4 MG CAPS capsule Take 1 capsule (0.4 mg total) by mouth daily after supper. 11/08/21   Billey Co, MD    Physical  Exam: Vitals:   03/02/22 1833 03/02/22 1834 03/02/22 2000 03/02/22 2030  BP:  (!) 122/93 116/67 120/72  Pulse:  94 82 80  Resp:  19 18   Temp:  98.9 F (37.2 C)    TempSrc:  Oral    SpO2:  98% 100%   Weight: 97.5 kg     Height: 5' 10"$  (1.778 m)      Physical Exam Vitals and nursing note reviewed.  Constitutional:      General: He is not in acute distress.    Appearance: He is obese. He is not toxic-appearing.  HENT:     Head: Normocephalic and atraumatic.     Mouth/Throat:     Mouth: Mucous membranes are moist.     Pharynx: Oropharynx is clear.  Eyes:     Conjunctiva/sclera: Conjunctivae normal.     Pupils: Pupils are equal, round, and reactive to light.  Cardiovascular:     Rate and Rhythm: Normal rate and regular rhythm.     Heart sounds: No murmur heard. Pulmonary:     Effort: Pulmonary effort is normal. No respiratory distress.     Breath sounds: Normal breath sounds. No wheezing, rhonchi or rales.  Abdominal:     General: Bowel sounds are normal. There is no distension.     Palpations: Abdomen is soft.     Tenderness: There is no abdominal tenderness. There is no guarding.  Musculoskeletal:     Right lower leg: No edema.     Left lower leg: No edema.  Skin:    General: Skin is warm and dry.  Neurological:     General: No focal deficit present.     Mental Status: He is alert and oriented to person, place, and time. Mental status is at baseline.  Psychiatric:        Mood and Affect: Mood normal.        Behavior: Behavior normal.    Data Reviewed: CBC with WBC of 9.3, hemoglobin of 9.3, MCV of 92 and platelets of 280 CMP with sodium of 137, potassium 3.5, bicarb 29, glucose 112, BUN 55, creatinine 1.15, AST 20, ALT 12 and GFR above 60  There are no new results to review at this time.  Assessment and Plan:  * Acute upper GI bleed Per chart review, patient has a history of duodenal ulcers requiring intervention in 2016 and 2020, felt to be secondary to NSAID  use.  Currently presenting  with 3 days of melena with acute drop in hemoglobin from 13.9 to 9.3 in 2 weeks.  Patient is unusually normotensive at this time, given per chart review he is typically hypertensive with systolics around 0000000.  Will plan to consult GI and make n.p.o. after midnight in case intervention is planned.  - GI consulted; appreciate their recommendations - Clear liquid diet - N.p.o. after midnight - Protonix IV twice daily - CBC every 8 hours - Transfuse for hemoglobin less than 7 or hemodynamic instability  OSA (obstructive sleep apnea) - CPAP at bedtime  CHF (congestive heart failure) ([place]) Patient appears euvolemic at this time, however given volume loss with GI bleed, will hold home Lasix and give IV fluids.  -Holding home Lasix  Hypertension Given relative hypotension compared to previous measurements, will hold home antihypertensives at this time.  - Restart antihypertensives when blood pressure stabilizes  Advance Care Planning:   Code Status: Full Code verified by patient  Consults: GI  Family Communication: Patient's friend and POA, Mariea Clonts, updated at bedside  Severity of Illness: The appropriate patient status for this patient is OBSERVATION. Observation status is judged to be reasonable and necessary in order to provide the required intensity of service to ensure the patient's safety. The patient's presenting symptoms, physical exam findings, and initial radiographic and laboratory data in the context of their medical condition is felt to place them at decreased risk for further clinical deterioration. Furthermore, it is anticipated that the patient will be medically stable for discharge from the hospital within 2 midnights of admission.   Author: Jose Persia, MD 03/02/2022 10:00 PM  For on call review [email or web address].

## 2022-03-03 DIAGNOSIS — I110 Hypertensive heart disease with heart failure: Secondary | ICD-10-CM | POA: Diagnosis present

## 2022-03-03 DIAGNOSIS — Z8619 Personal history of other infectious and parasitic diseases: Secondary | ICD-10-CM | POA: Diagnosis not present

## 2022-03-03 DIAGNOSIS — G4733 Obstructive sleep apnea (adult) (pediatric): Secondary | ICD-10-CM

## 2022-03-03 DIAGNOSIS — I10 Essential (primary) hypertension: Secondary | ICD-10-CM

## 2022-03-03 DIAGNOSIS — Z8616 Personal history of COVID-19: Secondary | ICD-10-CM | POA: Diagnosis not present

## 2022-03-03 DIAGNOSIS — K922 Gastrointestinal hemorrhage, unspecified: Secondary | ICD-10-CM | POA: Diagnosis present

## 2022-03-03 DIAGNOSIS — Z87442 Personal history of urinary calculi: Secondary | ICD-10-CM | POA: Diagnosis not present

## 2022-03-03 DIAGNOSIS — M549 Dorsalgia, unspecified: Secondary | ICD-10-CM | POA: Diagnosis present

## 2022-03-03 DIAGNOSIS — K921 Melena: Secondary | ICD-10-CM

## 2022-03-03 DIAGNOSIS — I5032 Chronic diastolic (congestive) heart failure: Secondary | ICD-10-CM | POA: Diagnosis present

## 2022-03-03 DIAGNOSIS — I509 Heart failure, unspecified: Secondary | ICD-10-CM | POA: Diagnosis not present

## 2022-03-03 DIAGNOSIS — K264 Chronic or unspecified duodenal ulcer with hemorrhage: Secondary | ICD-10-CM | POA: Diagnosis present

## 2022-03-03 DIAGNOSIS — F4310 Post-traumatic stress disorder, unspecified: Secondary | ICD-10-CM | POA: Diagnosis present

## 2022-03-03 DIAGNOSIS — Z888 Allergy status to other drugs, medicaments and biological substances status: Secondary | ICD-10-CM | POA: Diagnosis not present

## 2022-03-03 DIAGNOSIS — D62 Acute posthemorrhagic anemia: Secondary | ICD-10-CM

## 2022-03-03 DIAGNOSIS — Z8673 Personal history of transient ischemic attack (TIA), and cerebral infarction without residual deficits: Secondary | ICD-10-CM | POA: Diagnosis not present

## 2022-03-03 DIAGNOSIS — K319 Disease of stomach and duodenum, unspecified: Secondary | ICD-10-CM | POA: Diagnosis not present

## 2022-03-03 DIAGNOSIS — K269 Duodenal ulcer, unspecified as acute or chronic, without hemorrhage or perforation: Secondary | ICD-10-CM | POA: Diagnosis not present

## 2022-03-03 DIAGNOSIS — G8929 Other chronic pain: Secondary | ICD-10-CM | POA: Diagnosis present

## 2022-03-03 DIAGNOSIS — Z79899 Other long term (current) drug therapy: Secondary | ICD-10-CM | POA: Diagnosis not present

## 2022-03-03 LAB — CBC
HCT: 23.5 % — ABNORMAL LOW (ref 39.0–52.0)
HCT: 23.3 % — ABNORMAL LOW (ref 39.0–52.0)
HCT: 24.9 % — ABNORMAL LOW (ref 39.0–52.0)
Hemoglobin: 7.5 g/dL — ABNORMAL LOW (ref 13.0–17.0)
Hemoglobin: 7.3 g/dL — ABNORMAL LOW (ref 13.0–17.0)
Hemoglobin: 8.2 g/dL — ABNORMAL LOW (ref 13.0–17.0)
MCH: 29.5 pg (ref 26.0–34.0)
MCH: 29 pg (ref 26.0–34.0)
MCH: 29.9 pg (ref 26.0–34.0)
MCHC: 31.9 g/dL (ref 30.0–36.0)
MCHC: 31.3 g/dL (ref 30.0–36.0)
MCHC: 32.9 g/dL (ref 30.0–36.0)
MCV: 92.5 fL (ref 80.0–100.0)
MCV: 92.5 fL (ref 80.0–100.0)
MCV: 90.9 fL (ref 80.0–100.0)
Platelets: 206 10*3/uL (ref 150–400)
Platelets: 208 10*3/uL (ref 150–400)
Platelets: 195 10*3/uL (ref 150–400)
RBC: 2.54 MIL/uL — ABNORMAL LOW (ref 4.22–5.81)
RBC: 2.52 MIL/uL — ABNORMAL LOW (ref 4.22–5.81)
RBC: 2.74 MIL/uL — ABNORMAL LOW (ref 4.22–5.81)
RDW: 14 % (ref 11.5–15.5)
RDW: 14.1 % (ref 11.5–15.5)
RDW: 14.1 % (ref 11.5–15.5)
WBC: 8.2 10*3/uL (ref 4.0–10.5)
WBC: 6.8 10*3/uL (ref 4.0–10.5)
WBC: 6.2 10*3/uL (ref 4.0–10.5)
nRBC: 0 % (ref 0.0–0.2)
nRBC: 0 % (ref 0.0–0.2)
nRBC: 0 % (ref 0.0–0.2)

## 2022-03-03 LAB — IRON AND TIBC
Iron: 70 ug/dL (ref 45–182)
Saturation Ratios: 24 % (ref 17.9–39.5)
TIBC: 293 ug/dL (ref 250–450)
UIBC: 223 ug/dL

## 2022-03-03 LAB — BASIC METABOLIC PANEL
Anion gap: 6 (ref 5–15)
BUN: 48 mg/dL — ABNORMAL HIGH (ref 8–23)
CO2: 27 mmol/L (ref 22–32)
Calcium: 8.1 mg/dL — ABNORMAL LOW (ref 8.9–10.3)
Chloride: 104 mmol/L (ref 98–111)
Creatinine, Ser: 1.08 mg/dL (ref 0.61–1.24)
GFR, Estimated: >60 mL/min (ref >60)
Glucose, Bld: 101 mg/dL — ABNORMAL HIGH (ref 70–99)
Potassium: 3.6 mmol/L (ref 3.5–5.1)
Sodium: 137 mmol/L (ref 135–145)

## 2022-03-03 LAB — VITAMIN B12: Vitamin B-12: 170 pg/mL — ABNORMAL LOW (ref 180–914)

## 2022-03-03 LAB — RETICULOCYTES
Immature Retic Fract: 34.9 % — ABNORMAL HIGH (ref 2.3–15.9)
RBC.: 2.5 MIL/uL — ABNORMAL LOW (ref 4.22–5.81)
Retic Count, Absolute: 100 10*3/uL (ref 19.0–186.0)
Retic Ct Pct: 4 % — ABNORMAL HIGH (ref 0.4–3.1)

## 2022-03-03 LAB — FOLATE: Folate: 11.9 ng/mL (ref >5.9)

## 2022-03-03 LAB — FERRITIN: Ferritin: 128 ng/mL (ref 24–336)

## 2022-03-03 LAB — PREPARE RBC (CROSSMATCH)

## 2022-03-03 MED ORDER — SODIUM CHLORIDE 0.9 % IV SOLN: Freq: Once | INTRAVENOUS | Status: DC

## 2022-03-03 MED ORDER — SODIUM CHLORIDE 0.9% IV SOLUTION
Freq: Once | INTRAVENOUS | Status: DC
  Filled 2022-03-03: qty 250

## 2022-03-03 MED ORDER — HYDROMORPHONE HCL 1 MG/ML IJ SOLN
1.0000 mg | Freq: Once | INTRAMUSCULAR | Status: AC
  Administered 2022-03-03: 1 mg via INTRAVENOUS
  Filled 2022-03-03: qty 1

## 2022-03-03 MED ORDER — TRAMADOL HCL 50 MG PO TABS: 50.0000 mg | ORAL_TABLET | Freq: Four times a day (QID) | ORAL | Status: DC | PRN

## 2022-03-03 NOTE — Progress Notes (Signed)
Progress Note   Patient: Kurt [name] [id number] DOB: February 19, 1950 DOA: 03/02/2022     0 DOS: the patient was seen and examined on 03/03/2022   Brief hospital course: Taken from H&P.  SHIZUO GIACHETTI is a 72 y.o. male with medical history significant of duodenal ulcers, hypertension, HFpEF, nephrolithiasis, who presents to the ED with complaints of dark stool for the past 2 days.  He denies any recent aspirin or NSAID use.   ED course: On arrival to the ED, patient was normotensive at 122/93 with heart rate of 94.  He was saturating at 98% on room air.  He was afebrile at 98.9.  Initial workup remarkable for hemoglobin of 9.3, previously 13.9 approximately 2 weeks ago, platelets of 280, glucose 112, BUN 55, creatinine 1.15 with GFR above 60.    2/11: Vital stable.  Hemoglobin decreased to 7.5 this morning.  Ordered 1 unit.  EGD was postponed until tomorrow due to some anesthesia medication issues in OR.    Assessment and Plan: * Acute upper GI bleed Per chart review, patient has a history of duodenal ulcers requiring intervention in 2016 and 2020, felt to be secondary to NSAID use.  Currently presenting with 3 days of melena with acute drop in hemoglobin from 13.9 to 9.3 in 2 weeks.  Further decreased to 7.5 this morning.  GI was consulted and initial plan was to do EGD today which got postponed until tomorrow morning - Clear liquid diet - N.p.o. after midnight - Protonix IV twice daily - Monitor hemoglobin -1 unit of PRBC ordered   OSA (obstructive sleep apnea) - CPAP at bedtime  CHF (congestive heart failure) ([place]) Patient appears euvolemic at this time, however given volume loss with GI bleed, will hold home Lasix  -Holding home Lasix  Acute blood loss anemia Please see above under GI bleed  Hypertension Blood pressure within goal. -Keep holding home antihypertensives - Restart antihypertensives when blood pressure stabilizes        Subjective: Patient was seen and  examined today.  He was complaining of bilateral leg pain, he was very concerned that he was not getting his home pain medications.  Last melanotic bowel movement was last night.  Physical Exam: Vitals:   03/03/22 0933 03/03/22 0933 03/03/22 1136 03/03/22 1225  BP:  (!) 119/55 (!) 136/55 125/64  Pulse: 77 77 71 69  Resp: 16 16 16 16  $ Temp: 98.4 F (36.9 C) 98.4 F (36.9 C) 98.5 F (36.9 C)   TempSrc: Oral Oral Oral   SpO2: 94% 94% 94% 94%  Weight:      Height:       General.     In no acute distress. Pulmonary.  Lungs clear bilaterally, normal respiratory effort. CV.  Regular rate and rhythm, no JVD, rub or murmur. Abdomen.  Soft, nontender, nondistended, BS positive. CNS.  Alert and oriented .  No focal neurologic deficit. Extremities.  No edema, no cyanosis, pulses intact and symmetrical. Psychiatry.  Judgment and insight appears normal.   Data Reviewed: Prior data reviewed  Family Communication: Discussed with patient  Disposition: Status is: Observation The patient will require care spanning > 2 midnights and should be moved to inpatient because: Severity of illness  Planned Discharge Destination: Home  DVT prophylaxis.  SCDs Time spent: 45 minutes  This record has been created using Systems analyst. Errors have been sought and corrected,but may not always be located. Such creation errors do not reflect on the standard of  care.   Author: Lorella Nimrod, MD 03/03/2022 3:29 PM  For on call review [email or web address].
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
## 2022-03-03 NOTE — Assessment & Plan Note (Signed)
Please see above under GI bleed

## 2022-03-03 NOTE — ED Notes (Signed)
Spoke with NP about pt lab work.  NP states that patient is hemodynamically stable at this time.

## 2022-03-03 NOTE — Hospital Course (Addendum)
Taken from H&P.  Kurt [name] is a 72 y.o. male with medical history significant of duodenal ulcers, hypertension, HFpEF, nephrolithiasis, who presents to the ED with complaints of dark stool for the past 2 days.  He denies any recent aspirin or NSAID use.   ED course: On arrival to the ED, patient was normotensive at 122/93 with heart rate of 94.  He was saturating at 98% on room air.  He was afebrile at 98.9.  Initial workup remarkable for hemoglobin of 9.3, previously 13.9 approximately 2 weeks ago, platelets of 280, glucose 112, BUN 55, creatinine 1.15 with GFR above 60.    2/11: Vital stable.  Hemoglobin decreased to 7.5 this morning.  Ordered 1 unit.  EGD was postponed until tomorrow due to some anesthesia medication issues in OR.  2/12: Vitals stable.  Hemoglobin with again slowly drifting down, at 7.9 after improving to 8.2 with 1 unit of PRBC yesterday.  EGD today showed a duodenal ulcer at the same site where he had prior ulcers.  It was cauterized.  GI is recommending resuming diet and lifelong twice daily Prilosec.  Anemia panel with B12 deficiency, levels at 170.  Folate and iron studies were within normal limit.  He was given 1 dose of 1000 mg of B12 IM, and will continue p.o. supplement.  His primary care physician should be able to monitor. Patient should avoid NSAID.  Patient is being discharged on his prior medications with twice daily Prilosec and B12 supplement.  He was also given some nutritional supplement as advised by our dietitian.  Patient need to have a close follow-up with PCP and gastroenterology for further recommendations.

## 2022-03-03 NOTE — ED Notes (Signed)
Advised nurse that patient have ready bed

## 2022-03-03 NOTE — ED Notes (Addendum)
Notified NP of change of Hgb from 9.3 to 7.5.

## 2022-03-03 NOTE — ED Notes (Addendum)
RN messaged day shift MD the following message.  Pt Hgb has dropped from 9.3 to 7.5 as of 0300.  Is there anything you would like me to do about this?  This patient is becoming agitated because the previous MD discontinued all of his normal pain medications.  The patient normally takes 1300 mg Tylenol AM and HS as well as Lyrica.  The patient repeatedly talks about leaving if we cannot control his pain.   Waiting for response.

## 2022-03-03 NOTE — Progress Notes (Signed)
Brief GI note  EGD is postponed until tomorrow due to an urgent case in the OR N.p.o. effective a.m. tomorrow clear liquids today  RV

## 2022-03-04 ENCOUNTER — Encounter: Payer: Self-pay | Admitting: Internal Medicine

## 2022-03-04 ENCOUNTER — Inpatient Hospital Stay: Payer: Medicare (Managed Care) | Admitting: Anesthesiology

## 2022-03-04 ENCOUNTER — Other Ambulatory Visit: Payer: Self-pay

## 2022-03-04 ENCOUNTER — Encounter
Admission: EM | Disposition: A | Discharge status: Home / Self Care | Payer: Self-pay | Source: Home / Self Care | Attending: Internal Medicine

## 2022-03-04 DIAGNOSIS — K922 Gastrointestinal hemorrhage, unspecified: Secondary | ICD-10-CM | POA: Diagnosis not present

## 2022-03-04 DIAGNOSIS — K269 Duodenal ulcer, unspecified as acute or chronic, without hemorrhage or perforation: Secondary | ICD-10-CM | POA: Diagnosis not present

## 2022-03-04 DIAGNOSIS — K319 Disease of stomach and duodenum, unspecified: Secondary | ICD-10-CM

## 2022-03-04 HISTORY — PX: ESOPHAGOGASTRODUODENOSCOPY (EGD) WITH PROPOFOL: SHX5813

## 2022-03-04 LAB — CBC
HCT: 24.7 % — ABNORMAL LOW (ref 39.0–52.0)
Hemoglobin: 7.9 g/dL — ABNORMAL LOW (ref 13.0–17.0)
MCH: 29.6 pg (ref 26.0–34.0)
MCHC: 32 g/dL (ref 30.0–36.0)
MCV: 92.5 fL (ref 80.0–100.0)
Platelets: 181 10*3/uL (ref 150–400)
RBC: 2.67 MIL/uL — ABNORMAL LOW (ref 4.22–5.81)
RDW: 14.6 % (ref 11.5–15.5)
WBC: 7.1 10*3/uL (ref 4.0–10.5)
nRBC: 0 % (ref 0.0–0.2)

## 2022-03-04 LAB — BPAM RBC
Blood Product Expiration Date: 202403092359
ISSUE DATE / TIME: 202402110905
Unit Type and Rh: 6200

## 2022-03-04 LAB — TYPE AND SCREEN
ABO/RH(D): A POS
Antibody Screen: NEGATIVE
Unit division: 0

## 2022-03-04 LAB — BASIC METABOLIC PANEL
Anion gap: 4 — ABNORMAL LOW (ref 5–15)
BUN: 27 mg/dL — ABNORMAL HIGH (ref 8–23)
CO2: 28 mmol/L (ref 22–32)
Calcium: 8.3 mg/dL — ABNORMAL LOW (ref 8.9–10.3)
Chloride: 105 mmol/L (ref 98–111)
Creatinine, Ser: 0.79 mg/dL (ref 0.61–1.24)
GFR, Estimated: >60 mL/min (ref >60)
Glucose, Bld: 107 mg/dL — ABNORMAL HIGH (ref 70–99)
Potassium: 4 mmol/L (ref 3.5–5.1)
Sodium: 137 mmol/L (ref 135–145)

## 2022-03-04 SURGERY — ESOPHAGOGASTRODUODENOSCOPY (EGD) WITH PROPOFOL
Anesthesia: General

## 2022-03-04 MED ORDER — PROSOURCE PLUS PO LIQD: 30.0000 mL | Freq: Two times a day (BID) | ORAL | 2 refills | Status: DC

## 2022-03-04 MED ORDER — CYANOCOBALAMIN 1000 MCG/ML IJ SOLN
1000.0000 ug | Freq: Once | INTRAMUSCULAR | Status: AC
  Administered 2022-03-04: 1000 ug via INTRAMUSCULAR
  Filled 2022-03-04: qty 1

## 2022-03-04 MED ORDER — PHENYLEPHRINE 80 MCG/ML (10ML) SYRINGE FOR IV PUSH (FOR BLOOD PRESSURE SUPPORT)
PREFILLED_SYRINGE | INTRAVENOUS | Status: AC
  Filled 2022-03-04: qty 10

## 2022-03-04 MED ORDER — CYANOCOBALAMIN 1000 MCG PO TABS: 1000.0000 ug | ORAL_TABLET | Freq: Every day | ORAL | 1 refills | Status: DC

## 2022-03-04 MED ORDER — ADULT MULTIVITAMIN W/MINERALS CH: 1.0000 | ORAL_TABLET | Freq: Every day | ORAL | 1 refills | Status: DC

## 2022-03-04 MED ORDER — SODIUM CHLORIDE 0.9 % IV SOLN: INTRAVENOUS | Status: DC

## 2022-03-04 MED ORDER — PROPOFOL 500 MG/50ML IV EMUL
INTRAVENOUS | Status: DC | PRN
  Administered 2022-03-04: 150 ug/kg/min via INTRAVENOUS

## 2022-03-04 MED ORDER — VITAMIN B-12 1000 MCG PO TABS: 1000.0000 ug | ORAL_TABLET | Freq: Every day | ORAL | Status: DC

## 2022-03-04 MED ORDER — PHENYLEPHRINE HCL (PRESSORS) 10 MG/ML IV SOLN
INTRAVENOUS | Status: DC | PRN
  Administered 2022-03-04: 240 ug via INTRAVENOUS

## 2022-03-04 MED ORDER — PROPOFOL 10 MG/ML IV BOLUS
INTRAVENOUS | Status: DC | PRN
  Administered 2022-03-04: 60 mg via INTRAVENOUS

## 2022-03-04 MED ORDER — ONDANSETRON HCL 4 MG/2ML IJ SOLN
INTRAMUSCULAR | Status: AC
  Filled 2022-03-04: qty 2

## 2022-03-04 MED ORDER — VITAMIN B-12 1000 MCG PO TABS
1000.0000 ug | ORAL_TABLET | Freq: Every day | ORAL | Status: DC
  Administered 2022-03-04: 1000 ug via ORAL

## 2022-03-04 MED ORDER — PROSOURCE PLUS PO LIQD: 30.0000 mL | Freq: Two times a day (BID) | ORAL | Status: DC

## 2022-03-04 MED ORDER — OXYMETAZOLINE HCL 0.05 % NA SOLN
NASAL | Status: AC
  Filled 2022-03-04: qty 30

## 2022-03-04 MED ORDER — ADULT MULTIVITAMIN W/MINERALS CH: 1.0000 | ORAL_TABLET | Freq: Every day | ORAL | 1 refills | Status: AC

## 2022-03-04 MED ORDER — EPHEDRINE 5 MG/ML INJ
INTRAVENOUS | Status: AC
  Filled 2022-03-04: qty 5

## 2022-03-04 MED ORDER — OMEPRAZOLE 20 MG PO CPDR: 20.0000 mg | DELAYED_RELEASE_CAPSULE | Freq: Two times a day (BID) | ORAL | 2 refills | Status: DC

## 2022-03-04 MED ORDER — BOOST / RESOURCE BREEZE PO LIQD CUSTOM: 1.0000 | Freq: Three times a day (TID) | ORAL | Status: DC

## 2022-03-04 MED ORDER — LIDOCAINE HCL (PF) 2 % IJ SOLN
INTRAMUSCULAR | Status: AC
  Filled 2022-03-04: qty 5

## 2022-03-04 MED ORDER — LIDOCAINE HCL (CARDIAC) PF 100 MG/5ML IV SOSY
PREFILLED_SYRINGE | INTRAVENOUS | Status: DC | PRN
  Administered 2022-03-04: 100 mg via INTRAVENOUS

## 2022-03-04 MED ORDER — ADULT MULTIVITAMIN W/MINERALS CH: 1.0000 | ORAL_TABLET | Freq: Every day | ORAL | Status: DC

## 2022-03-04 MED ORDER — GLYCOPYRROLATE 0.2 MG/ML IJ SOLN
INTRAMUSCULAR | Status: AC
  Filled 2022-03-04: qty 1

## 2022-03-04 NOTE — Anesthesia Preprocedure Evaluation (Signed)
Anesthesia Evaluation  Patient identified by MRN, date of birth, ID band Patient awake    Reviewed: Allergy & Precautions, H&P , NPO status , Patient's Chart, lab work & pertinent test results  Airway Mallampati: II  TM Distance: >3 FB Neck ROM: full    Dental no notable dental hx.    Pulmonary neg pulmonary ROS, neg sleep apnea (per patient)   Pulmonary exam normal        Cardiovascular Exercise Tolerance: Poor hypertension, +CHF (HFpEF)  Normal cardiovascular exam     Neuro/Psych  PSYCHIATRIC DISORDERS      TIA   GI/Hepatic Neg liver ROS,,,H/o Duodenal ulcer 05/2018 melena   Endo/Other  negative endocrine ROS    Renal/GU negative Renal ROS  negative genitourinary   Musculoskeletal   Abdominal   Peds  Hematology  (+) Blood dyscrasia, anemia   Anesthesia Other Findings Gait issues due to trauma. Uses walker. Denies DOE.  Past Medical History: No date: Arthritis No date: CHF (congestive heart failure) (HCC)     Comment:  diastolic dysfunction No date: Chronic back pain No date: COVID-19 No date: Depression No date: Diverticulitis 05/2018: Duodenal ulcer     Comment:  Treated with 3 clips/Dr. Perry 2003: Fall off scafolding     Comment:  prolonged hospitalization- multiple BUE surgery,               concussion 05/2018: GI bleed 2016: GIB (gastrointestinal bleeding)     Comment:  Antral ulcer/duodenal bleed per Endoscopy by Dr. Benson Norway No date: Hypertension No date: PTSD (post-traumatic stress disorder)     Comment:  after  a Fall  No date: Renal insufficiency     Comment:  Decrease in GFR- PCP removed all NSAIDS & aspirin   Past Surgical History: 06/16/2018: BIOPSY     Comment:  Procedure: BIOPSY;  Surgeon: Irene Shipper, MD;                Location: [facility] ENDOSCOPY;  Service: Endoscopy;; No date: ELBOW FRACTURE SURGERY     Comment:  ORIF, Shoulder manipulation   04/30/2014: ESOPHAGOGASTRODUODENOSCOPY;  N/A     Comment:  Procedure: ESOPHAGOGASTRODUODENOSCOPY (EGD);  Surgeon:               Carol Ada, MD;  Location: [facility] ENDOSCOPY;  Service:               Endoscopy;  Laterality: N/A; 06/16/2018: ESOPHAGOGASTRODUODENOSCOPY (EGD) WITH PROPOFOL; N/A     Comment:  Procedure: ESOPHAGOGASTRODUODENOSCOPY (EGD) WITH               PROPOFOL;  Surgeon: Irene Shipper, MD;  Location: [facility]               ENDOSCOPY;  Service: Endoscopy;  Laterality: N/A; 11/08/2021: EXTRACORPOREAL SHOCK WAVE LITHOTRIPSY; Right     Comment:  Procedure: EXTRACORPOREAL SHOCK WAVE LITHOTRIPSY (ESWL);              Surgeon: Billey Co, MD;  Location: [facility] ORS;                Service: Urology;  Laterality: Right; 06/16/2018: HEMOSTASIS CLIP PLACEMENT     Comment:  Procedure: HEMOSTASIS CLIP PLACEMENT;  Surgeon: Irene Shipper, MD;  Location: [facility] ENDOSCOPY;  Service:               Endoscopy;; 02/16/2015: LUMBAR LAMINECTOMY/DECOMPRESSION MICRODISCECTOMY; N/A     Comment:  Procedure: Thoracic ten - thoracic twelve laminectomy;                Surgeon: Earnie Larsson, MD;  Location: [place] NEURO ORS;                Service: Neurosurgery;  Laterality: N/A; 03/22/2016: LUMBAR LAMINECTOMY/DECOMPRESSION MICRODISCECTOMY; N/A     Comment:  Procedure: Lumbar One-Two, Lumbar Two-Three, Lumbar               Three-Four Laminectomy and Foraminotomy;  Surgeon: Earnie Larsson, MD;  Location: [place];  Service: Neurosurgery;                Laterality: N/A; No date: NASAL SEPTUM SURGERY 02/06/2021: ORIF ACETABULAR FRACTURE; Right     Comment:  Procedure: 1. OPEN REDUCTION INTERNAL FIXATION (ORIF)               RIGHT  ACETABULAR FRACTURE USING STOPPA APPROACH 2.               REMOVAL OF TRACTION PIN RIGHT TIBIA;  Surgeon: Altamese Ekron, MD;  Location: [place];  Service: Orthopedics;                Laterality: Right; 02/06/2021: SACRO-ILIAC PINNING; Bilateral     Comment:  Procedure: 3.LEFT AND RIGHT (TRANS) SACROILIAC  SCREW               FIXATION;  Surgeon: Altamese Lake Hart, MD;  Location: [place];              Service: Orthopedics;  Laterality: Bilateral; 06/16/2018: SCLEROTHERAPY     Comment:  Procedure: SCLEROTHERAPY;  Surgeon: Irene Shipper, MD;                Location: [place];  Service: Endoscopy;; 02/18/2019: VIDEO BRONCHOSCOPY; Bilateral     Comment:  Procedure: VIDEO BRONCHOSCOPY WITHOUT FLUORO;  Surgeon:               Margaretha Seeds, MD;  Location: [place];  Service:              Pulmonary;  Laterality: Bilateral; 02/06/2021: WOUND EXPLORATION; N/A     Comment:  Procedure: 4. APPLICATION OF  WOUND, SMALL;  Surgeon:               Altamese Thurston, MD;  Location: [place];  Service:               Orthopedics;  Laterality: N/A;  BMI    Body Mass Index: 29.37 kg/m      Reproductive/Obstetrics negative OB ROS                             Anesthesia Physical Anesthesia Plan  ASA: 3  Anesthesia Plan: General   Post-op Pain Management:    Induction: Intravenous  PONV Risk Score and Plan: Propofol infusion and TIVA  Airway Management Planned: Natural Airway  Additional Equipment:   Intra-op Plan:   Post-operative Plan:   Informed Consent: I have reviewed the patients History and Physical, chart, labs and discussed the procedure including the risks, benefits and alternatives for the proposed anesthesia with the patient or authorized representative who has indicated his/her understanding and acceptance.     Dental Advisory Given  Plan Discussed with: CRNA  and Surgeon  Anesthesia Plan Comments:         Anesthesia Quick Evaluation

## 2022-03-04 NOTE — Discharge Summary (Signed)
Physician Discharge Summary   Patient: Kurt [name] MRN: [id number] DOB: 1950-08-15  Admit date:     03/02/2022  Discharge date: 03/04/22  Discharge Physician: Lorella Nimrod   PCP: Velta Addison, Claretha Cooper, DO   Recommendations at discharge:  Please obtain CBC and BMP in 1 week Follow-up with primary care provider within a week Follow-up with gastroenterology  Discharge Diagnoses: Principal Problem:   Acute upper GI bleed Active Problems:   Hypertension   Acute blood loss anemia   CHF (congestive heart failure) (HCC)   OSA (obstructive sleep apnea)   Duodenal ulcer   Gastric erythema   Hospital Course: Taken from H&P.  Kurt [name] is a 72 y.o. male with medical history significant of duodenal ulcers, hypertension, HFpEF, nephrolithiasis, who presents to the ED with complaints of dark stool for the past 2 days.  He denies any recent aspirin or NSAID use.   ED course: On arrival to the ED, patient was normotensive at 122/93 with heart rate of 94.  He was saturating at 98% on room air.  He was afebrile at 98.9.  Initial workup remarkable for hemoglobin of 9.3, previously 13.9 approximately 2 weeks ago, platelets of 280, glucose 112, BUN 55, creatinine 1.15 with GFR above 60.    2/11: Vital stable.  Hemoglobin decreased to 7.5 this morning.  Ordered 1 unit.  EGD was postponed until tomorrow due to some anesthesia medication issues in OR.  2/12: Vitals stable.  Hemoglobin with again slowly drifting down, at 7.9 after improving to 8.2 with 1 unit of PRBC yesterday.  EGD today showed a duodenal ulcer at the same site where he had prior ulcers.  It was cauterized.  GI is recommending resuming diet and lifelong twice daily Prilosec.  Anemia panel with B12 deficiency, levels at 170.  Folate and iron studies were within normal limit.  He was given 1 dose of 1000 mg of B12 IM, and will continue p.o. supplement.  His primary care physician should be able to monitor. Patient should avoid  NSAID.  Patient is being discharged on his prior medications with twice daily Prilosec and B12 supplement.  He was also given some nutritional supplement as advised by our dietitian.  Patient need to have a close follow-up with PCP and gastroenterology for further recommendations.  Assessment and Plan: * Acute upper GI bleed Per chart review, patient has a history of duodenal ulcers requiring intervention in 2016 and 2020, felt to be secondary to NSAID use.  Currently presenting with 3 days of melena with acute drop in hemoglobin from 13.9 to 9.3 in 2 weeks.  Further decreased to 7.5 this morning.  GI was consulted and initial plan was to do EGD today which got postponed until tomorrow morning - Clear liquid diet - N.p.o. after midnight - Protonix IV twice daily - Monitor hemoglobin -1 unit of PRBC ordered   OSA (obstructive sleep apnea) - CPAP at bedtime  CHF (congestive heart failure) ([place]) Patient appears euvolemic at this time, however given volume loss with GI bleed, will hold home Lasix  -Holding home Lasix  Acute blood loss anemia Please see above under GI bleed  Hypertension Blood pressure within goal. -Keep holding home antihypertensives - Restart antihypertensives when blood pressure stabilizes         Consultants: Gastroenterology Procedures performed: EGD Disposition: Home Diet recommendation:  Discharge Diet Orders (From admission, onward)     Start     Ordered   03/04/22 0000  Diet -  low sodium heart healthy        03/04/22 1536           Cardiac diet DISCHARGE MEDICATION: Allergies as of 03/04/2022       Reactions   Lyrica [pregabalin] Other (See Comments)   Falls and balance problems        Medication List     STOP taking these medications    famotidine 20 MG tablet Commonly known as: PEPCID   ferrous sulfate 324 MG Tbec   fluticasone 50 MCG/ACT nasal spray Commonly known as: FLONASE   HYDROcodone-acetaminophen 5-325 MG  tablet Commonly known as: NORCO/VICODIN   levETIRAcetam 1000 MG tablet Commonly known as: Keppra   montelukast 10 MG tablet Commonly known as: SINGULAIR   Muscle Rub 10-15 % Crea   oxyCODONE 5 MG immediate release tablet Commonly known as: Oxy IR/ROXICODONE   pantoprazole 40 MG tablet Commonly known as: PROTONIX   traMADol 50 MG tablet Commonly known as: ULTRAM       TAKE these medications    (feeding supplement) PROSource Plus liquid Take 30 mLs by mouth 2 (two) times daily between meals.   acetaminophen 650 MG CR tablet Commonly known as: TYLENOL Take 1,300 mg by mouth 2 (two) times daily. What changed: Another medication with the same name was removed. Continue taking this medication, and follow the directions you see here.   albuterol 108 (90 Base) MCG/ACT inhaler Commonly known as: VENTOLIN HFA INHALE 2 PUFFS INTO THE LUNGS EVERY 4 (FOUR) HOURS AS NEEDED FOR WHEEZING OR SHORTNESS OF BREATH.   allopurinol 100 MG tablet Commonly known as: ZYLOPRIM Take 100 mg by mouth 2 (two) times a day.   amLODipine 2.5 MG tablet Commonly known as: NORVASC Take 2.5 mg by mouth daily.   atorvastatin 40 MG tablet Commonly known as: LIPITOR Take 40 mg by mouth daily.   busPIRone 15 MG tablet Commonly known as: BUSPAR Take 15 mg by mouth daily.   carvedilol 3.125 MG tablet Commonly known as: COREG Take 1 tablet (3.125 mg total) by mouth 2 (two) times daily with a meal.   cyanocobalamin 1000 MCG tablet Take 1 tablet (1,000 mcg total) by mouth daily. Start taking on: March 05, 2022   furosemide 80 MG tablet Commonly known as: LASIX Take 80 mg by mouth daily.   lidocaine 5 % Commonly known as: LIDODERM 1 patch daily.   Lyrica 25 MG capsule Generic drug: pregabalin Take 25 mg by mouth 2 (two) times daily.   multivitamin with minerals Tabs tablet Take 1 tablet by mouth daily.   omeprazole 20 MG capsule Commonly known as: PRILOSEC Take 1 capsule (20 mg  total) by mouth 2 (two) times daily before a meal.   polyethylene glycol 17 g packet Commonly known as: MIRALAX / GLYCOLAX Take 17 g by mouth daily as needed for mild constipation.   sertraline 100 MG tablet Commonly known as: ZOLOFT Take 2 tablets (200 mg total) by mouth every morning.   tamsulosin 0.4 MG Caps capsule Commonly known as: FLOMAX Take 1 capsule (0.4 mg total) by mouth daily after supper.   traZODone 50 MG tablet Commonly known as: DESYREL Take 50 mg by mouth at bedtime.        Follow-up Information     Velta Addison, Jill E, DO. Schedule an appointment as soon as possible for a visit in 1 week(s).   Specialty: Osteopathic Medicine Contact information: [address] [place] [address] [phone]  Lin Landsman, MD Follow up in 1 month(s).   Specialty: Gastroenterology Contact information: [place] [address] [phone]                Discharge Exam: Danley Danker Weights   03/02/22 1833 03/03/22 1609  Weight: 97.5 kg 92.9 kg   General.  In no acute distress. Pulmonary.  Lungs clear bilaterally, normal respiratory effort. CV.  Regular rate and rhythm, no JVD, rub or murmur. Abdomen.  Soft, nontender, nondistended, BS positive. CNS.  Alert and oriented .  No focal neurologic deficit. Extremities.  No edema, no cyanosis, pulses intact and symmetrical. Psychiatry.  Judgment and insight appears normal.   Condition at discharge: stable  The results of significant diagnostics from this hospitalization (including imaging, microbiology, ancillary and laboratory) are listed below for reference.   Imaging Studies: CT L-SPINE NO CHARGE  Result Date: 02/10/2022 CLINICAL DATA:  Fall, low back pain EXAM: CT LUMBAR SPINE WITHOUT CONTRAST TECHNIQUE: Multidetector CT imaging of the lumbar spine was performed without intravenous contrast administration. Multiplanar CT image reconstructions were also generated. RADIATION  DOSE REDUCTION: This exam was performed according to the departmental dose-optimization program which includes automated exposure control, adjustment of the mA and/or kV according to patient size and/or use of iterative reconstruction technique. COMPARISON:  MRI 09/09/2017 FINDINGS: Segmentation: 5 lumbar type vertebrae. Alignment: Normal. Vertebrae: No acute fracture or focal pathologic process. Stable cyst or hemangioma within the posterior L3 vertebral body. Paraspinal and other soft tissues: Negative. Nonobstructing right renal calculi are partially visualized on this examination. Right acetabular ORIF and bilateral sacroiliac arthrodesis utilizing a single lag screw is identified. Cortical defects within the medial wall of the visualized right acetabulum the inferior margin of the examination may represent refracture or nonunion, but are incompletely evaluated on this examination. Disc levels: There is diffuse intervertebral disc space narrowing and endplate remodeling throughout the visualized thoracolumbar spine in keeping with changes of diffuse severe degenerative disc disease. Bilateral laminectomy and resection of the spinous processes of T11 and T12 as well as right L1-L4 laminotomies have been performed. Advanced facet arthrosis in combination with disc height loss and posterior disc osteophyte complex ease results in multilevel moderate central canal stenosis at L1-L4. Similarly, degenerative changes result in high-grade bilateral neuroforaminal narrowing at T11-L3 as well as L5-S1. Milder narrowing noted at L3-L5. IMPRESSION: 1. No acute fracture or listhesis of the lumbar spine. 2. Diffuse severe degenerative disc disease and facet arthrosis resulting in multilevel moderate central canal stenosis and high-grade bilateral neuroforaminal narrowing at T11-L3 as well as L5-S1. 3. Right acetabular ORIF and bilateral sacroiliac arthrodesis utilizing a single lag screw. Cortical defects within the medial  wall of the visualized right acetabulum may represent refracture or nonunion, but are incompletely evaluated on this examination. If indicated, this would be better assessed with dedicated CT imaging of the pelvis. 4. Nonobstructing right renal calculi. Electronically Signed   By: Fidela Salisbury M.D.   On: 02/10/2022 21:56   CT Renal Stone Study  Result Date: 02/10/2022 CLINICAL DATA:  Abdominal pain and flank pain EXAM: CT ABDOMEN AND PELVIS WITHOUT CONTRAST TECHNIQUE: Multidetector CT imaging of the abdomen and pelvis was performed following the standard protocol without IV contrast. RADIATION DOSE REDUCTION: This exam was performed according to the departmental dose-optimization program which includes automated exposure control, adjustment of the mA and/or kV according to patient size and/or use of iterative reconstruction technique. COMPARISON:  CT pelvis 03/07/2021 FINDINGS: Lower chest: There is some  scarring in the lung bases. Hepatobiliary: No focal liver abnormality is seen. No gallstones, gallbladder wall thickening, or biliary dilatation. Pancreas: Unremarkable. No pancreatic ductal dilatation or surrounding inflammatory changes. Spleen: Normal in size without focal abnormality. Adrenals/Urinary Tract: There are punctate nonobstructing right renal calculi measuring up to 8 mm. Otherwise, the kidneys, adrenal glands and bladder are within normal limits. Stomach/Bowel: Stomach is within normal limits. Appendix appears normal. No evidence of bowel wall thickening, distention, or inflammatory changes. There is sigmoid colon diverticulosis. Vascular/Lymphatic: No significant vascular findings are present. No enlarged abdominal or pelvic lymph nodes. Reproductive: Prostate gland is enlarged. Other: No abdominal wall hernia or abnormality. No abdominopelvic ascites. Musculoskeletal: There are healed right sixth and seventh rib fractures. Horizontal screw traversing the bilateral sacroiliac joints. Fixation  plate and screws are seen throughout the right hemipelvis and across the pubic symphysis. Healed right acetabular and pelvic fractures are seen. Degenerative changes affect the spine and hips. No acute fractures are identified. IMPRESSION: 1. No acute localizing process in the abdomen or pelvis. 2. Nonobstructing right renal calculi. 3. Sigmoid colon diverticulosis. 4. Prostatomegaly. Electronically Signed   By: Ronney Asters M.D.   On: 02/10/2022 21:46   CT Head Wo Contrast  Result Date: 02/10/2022 CLINICAL DATA:  Head trauma, intracranial venous injury suspected; Facial trauma, blunt; Neck trauma (Age >= 65y). Fall. EXAM: CT HEAD WITHOUT CONTRAST CT MAXILLOFACIAL WITHOUT CONTRAST CT CERVICAL SPINE WITHOUT CONTRAST TECHNIQUE: Multidetector CT imaging of the head, cervical spine, and maxillofacial structures were performed using the standard protocol without intravenous contrast. Multiplanar CT image reconstructions of the cervical spine and maxillofacial structures were also generated. RADIATION DOSE REDUCTION: This exam was performed according to the departmental dose-optimization program which includes automated exposure control, adjustment of the mA and/or kV according to patient size and/or use of iterative reconstruction technique. COMPARISON:  03/07/2021, 09/09/2017 FINDINGS: CT HEAD FINDINGS Brain: No evidence of acute infarction, hemorrhage, hydrocephalus, extra-axial collection or mass lesion/mass effect. Vascular: No hyperdense vessel or unexpected calcification. Skull: Normal. Negative for fracture or focal lesion. Other: There is fluid opacification of bilateral inferior mastoid air cells without associated osseous erosion. Middle ear cavities are clear. CT MAXILLOFACIAL FINDINGS Osseous: No acute facial fracture.  No mandibular dislocation. Orbits: Negative. No traumatic or inflammatory finding. Sinuses: Left maxillary antrostomy has been performed. Moderate mucosal thickening and layering fluid  noted within the left maxillary sinus. Air-fluid levels also noted within the sphenoid sinuses bilaterally. Remaining paranasal sinuses are clear. Soft tissues: Negative. CT CERVICAL SPINE FINDINGS Alignment: Stable 2-3 mm anterolisthesis C3-4, likely degenerative in nature. Otherwise normal cervical alignment. Skull base and vertebrae: Craniocervical alignment is normal. The atlantodental interval is not widened. No acute fracture of the cervical spine. Vertebral body height is preserved. Soft tissues and spinal canal: No prevertebral fluid or swelling. No visible canal hematoma. Disc levels: There is intervertebral disc space narrowing and endplate remodeling of a C4-T3 in keeping with changes of severe degenerative disc disease. Prevertebral soft tissues are not thickened on sagittal reformats. No high-grade canal stenosis identified. Multilevel uncovertebral and facet arthrosis result in multilevel bilateral severe neuroforaminal narrowing throughout the cervical spine, similar to prior examination. Upper chest: Negative. Other: None IMPRESSION: 1. No acute intracranial abnormality. No calvarial fracture. 2. No acute facial fracture. 3. Left maxillary antrostomy. Acute on chronic left maxillary and bilateral sphenoid sinusitis. 4. Bilateral mastoid effusions. 5. No acute fracture or listhesis of the cervical spine. 6. Advanced multilevel degenerative disc disease and facet arthrosis resulting in  multilevel bilateral severe neuroforaminal narrowing throughout the cervical spine, similar to prior examination. Electronically Signed   By: Fidela Salisbury M.D.   On: 02/10/2022 20:37   CT Cervical Spine Wo Contrast  Result Date: 02/10/2022 CLINICAL DATA:  Head trauma, intracranial venous injury suspected; Facial trauma, blunt; Neck trauma (Age >= 65y). Fall. EXAM: CT HEAD WITHOUT CONTRAST CT MAXILLOFACIAL WITHOUT CONTRAST CT CERVICAL SPINE WITHOUT CONTRAST TECHNIQUE: Multidetector CT imaging of the head, cervical  spine, and maxillofacial structures were performed using the standard protocol without intravenous contrast. Multiplanar CT image reconstructions of the cervical spine and maxillofacial structures were also generated. RADIATION DOSE REDUCTION: This exam was performed according to the departmental dose-optimization program which includes automated exposure control, adjustment of the mA and/or kV according to patient size and/or use of iterative reconstruction technique. COMPARISON:  03/07/2021, 09/09/2017 FINDINGS: CT HEAD FINDINGS Brain: No evidence of acute infarction, hemorrhage, hydrocephalus, extra-axial collection or mass lesion/mass effect. Vascular: No hyperdense vessel or unexpected calcification. Skull: Normal. Negative for fracture or focal lesion. Other: There is fluid opacification of bilateral inferior mastoid air cells without associated osseous erosion. Middle ear cavities are clear. CT MAXILLOFACIAL FINDINGS Osseous: No acute facial fracture.  No mandibular dislocation. Orbits: Negative. No traumatic or inflammatory finding. Sinuses: Left maxillary antrostomy has been performed. Moderate mucosal thickening and layering fluid noted within the left maxillary sinus. Air-fluid levels also noted within the sphenoid sinuses bilaterally. Remaining paranasal sinuses are clear. Soft tissues: Negative. CT CERVICAL SPINE FINDINGS Alignment: Stable 2-3 mm anterolisthesis C3-4, likely degenerative in nature. Otherwise normal cervical alignment. Skull base and vertebrae: Craniocervical alignment is normal. The atlantodental interval is not widened. No acute fracture of the cervical spine. Vertebral body height is preserved. Soft tissues and spinal canal: No prevertebral fluid or swelling. No visible canal hematoma. Disc levels: There is intervertebral disc space narrowing and endplate remodeling of a C4-T3 in keeping with changes of severe degenerative disc disease. Prevertebral soft tissues are not thickened on  sagittal reformats. No high-grade canal stenosis identified. Multilevel uncovertebral and facet arthrosis result in multilevel bilateral severe neuroforaminal narrowing throughout the cervical spine, similar to prior examination. Upper chest: Negative. Other: None IMPRESSION: 1. No acute intracranial abnormality. No calvarial fracture. 2. No acute facial fracture. 3. Left maxillary antrostomy. Acute on chronic left maxillary and bilateral sphenoid sinusitis. 4. Bilateral mastoid effusions. 5. No acute fracture or listhesis of the cervical spine. 6. Advanced multilevel degenerative disc disease and facet arthrosis resulting in multilevel bilateral severe neuroforaminal narrowing throughout the cervical spine, similar to prior examination. Electronically Signed   By: Fidela Salisbury M.D.   On: 02/10/2022 20:37   CT MAXILLOFACIAL WO CONTRAST  Result Date: 02/10/2022 CLINICAL DATA:  Head trauma, intracranial venous injury suspected; Facial trauma, blunt; Neck trauma (Age >= 65y). Fall. EXAM: CT HEAD WITHOUT CONTRAST CT MAXILLOFACIAL WITHOUT CONTRAST CT CERVICAL SPINE WITHOUT CONTRAST TECHNIQUE: Multidetector CT imaging of the head, cervical spine, and maxillofacial structures were performed using the standard protocol without intravenous contrast. Multiplanar CT image reconstructions of the cervical spine and maxillofacial structures were also generated. RADIATION DOSE REDUCTION: This exam was performed according to the departmental dose-optimization program which includes automated exposure control, adjustment of the mA and/or kV according to patient size and/or use of iterative reconstruction technique. COMPARISON:  03/07/2021, 09/09/2017 FINDINGS: CT HEAD FINDINGS Brain: No evidence of acute infarction, hemorrhage, hydrocephalus, extra-axial collection or mass lesion/mass effect. Vascular: No hyperdense vessel or unexpected calcification. Skull: Normal. Negative for fracture or focal lesion.  Other: There is fluid  opacification of bilateral inferior mastoid air cells without associated osseous erosion. Middle ear cavities are clear. CT MAXILLOFACIAL FINDINGS Osseous: No acute facial fracture.  No mandibular dislocation. Orbits: Negative. No traumatic or inflammatory finding. Sinuses: Left maxillary antrostomy has been performed. Moderate mucosal thickening and layering fluid noted within the left maxillary sinus. Air-fluid levels also noted within the sphenoid sinuses bilaterally. Remaining paranasal sinuses are clear. Soft tissues: Negative. CT CERVICAL SPINE FINDINGS Alignment: Stable 2-3 mm anterolisthesis C3-4, likely degenerative in nature. Otherwise normal cervical alignment. Skull base and vertebrae: Craniocervical alignment is normal. The atlantodental interval is not widened. No acute fracture of the cervical spine. Vertebral body height is preserved. Soft tissues and spinal canal: No prevertebral fluid or swelling. No visible canal hematoma. Disc levels: There is intervertebral disc space narrowing and endplate remodeling of a C4-T3 in keeping with changes of severe degenerative disc disease. Prevertebral soft tissues are not thickened on sagittal reformats. No high-grade canal stenosis identified. Multilevel uncovertebral and facet arthrosis result in multilevel bilateral severe neuroforaminal narrowing throughout the cervical spine, similar to prior examination. Upper chest: Negative. Other: None IMPRESSION: 1. No acute intracranial abnormality. No calvarial fracture. 2. No acute facial fracture. 3. Left maxillary antrostomy. Acute on chronic left maxillary and bilateral sphenoid sinusitis. 4. Bilateral mastoid effusions. 5. No acute fracture or listhesis of the cervical spine. 6. Advanced multilevel degenerative disc disease and facet arthrosis resulting in multilevel bilateral severe neuroforaminal narrowing throughout the cervical spine, similar to prior examination. Electronically Signed   By: Fidela Salisbury  M.D.   On: 02/10/2022 20:37    Microbiology: Results for orders placed or performed in visit on 11/29/21  Microscopic Examination     Status: Abnormal   Collection Time: 11/29/21 10:25 AM   Urine  Result Value Ref Range Status   WBC, UA 0-5 0 - 5 /hpf Final   RBC, Urine 11-30 (A) 0 - 2 /hpf Final   Epithelial Cells (non renal) 0-10 0 - 10 /hpf Final   Bacteria, UA Few None seen/Few Final    Labs: CBC: Recent Labs  Lab 03/02/22 1846 03/03/22 0241 03/03/22 0853 03/03/22 1222 03/04/22 0428  WBC 9.3 8.2 6.8 6.2 7.1  NEUTROABS 6.6  --   --   --   --   HGB 9.3* 7.5* 7.3* 8.2* 7.9*  HCT 28.7* 23.5* 23.3* 24.9* 24.7*  MCV 92.3 92.5 92.5 90.9 92.5  PLT 280 206 208 195 0000000   Basic Metabolic Panel: Recent Labs  Lab 03/02/22 1846 03/03/22 0241 03/04/22 0428  NA 137 137 137  K 3.9 3.6 4.0  CL 101 104 105  CO2 29 27 28  $ GLUCOSE 112* 101* 107*  BUN 55* 48* 27*  CREATININE 1.15 1.08 0.79  CALCIUM 8.5* 8.1* 8.3*   Liver Function Tests: Recent Labs  Lab 03/02/22 1846  AST 20  ALT 12  ALKPHOS 74  BILITOT 0.8  PROT 6.8  ALBUMIN 3.6   CBG: No results for input(s): "GLUCAP" in the last 168 hours.  Discharge time spent: greater than 30 minutes.  This record has been created using Systems analyst. Errors have been sought and corrected,but may not always be located. Such creation errors do not reflect on the standard of care.   Signed: Lorella Nimrod, MD [facility] 03/04/2022

## 2022-03-04 NOTE — TOC Initial Note (Signed)
Transition of Care [facility]) - Initial/Assessment Note    Patient Details  Name: Kurt [name] MRN: [id number] Date of Birth: 1950-05-06  Transition of Care [facility]) CM/SW Contact:    Tiburcio Bash, LCSW Phone Number: 03/04/2022, 1:19 PM  Clinical Narrative:                  Patient is active with [facility], CSW received call from [phone] Shirlean Mylar for discharge planning, she reports to notify them of patient's discharge and they may abe able to assist with transport home.   Expected Discharge Plan: Home/Self Care Barriers to Discharge: Continued Medical Work up   Patient Goals and CMS Choice Patient states their goals for this hospitalization and ongoing recovery are:: to go home CMS Medicare.gov Compare Post Acute Care list provided to:: Patient Choice offered to / list presented to : Patient      Expected Discharge Plan and Services                                              Prior Living Arrangements/Services                       Activities of Daily Living Home Assistive Devices/Equipment: Wheelchair ADL Screening (condition at time of admission) Patient's cognitive ability adequate to safely complete daily activities?: Yes Is the patient deaf or have difficulty hearing?: Yes Does the patient have difficulty seeing, even when wearing glasses/contacts?: No Does the patient have difficulty concentrating, remembering, or making decisions?: No Patient able to express need for assistance with ADLs?: Yes Does the patient have difficulty dressing or bathing?: Yes Independently performs ADLs?: No Does the patient have difficulty walking or climbing stairs?: Yes Weakness of Legs: Both Weakness of Arms/Hands: None  Permission Sought/Granted                  Emotional Assessment              Admission diagnosis:  Upper GI bleed [K92.2] Acute upper GI bleed [K92.2] Patient Active Problem List   Diagnosis Date Noted   Duodenal ulcer  03/04/2022   Gastric erythema 03/04/2022   Acute upper GI bleed 03/02/2022   Pelvic fracture ([place]) 02/05/2021   Ankle fracture, left, closed, initial encounter 08/21/2020   Frequent falls 06/18/2019   Bilateral carpal tunnel syndrome 06/18/2019   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   Numbness and tingling of left upper extremity 06/07/2019   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, left 03/22/2019   On home oxygen therapy 03/22/2019   Impaired gait [id number]   Diastolic CHF ([place]) [id number]   Physical debility 02/23/2019   COVID-19    Cavitary pneumonia 02/17/2019   Diarrhea 02/16/2019   Respiratory failure, acute ([place]) 02/16/2019   Pulmonary nodule    OSA (obstructive sleep apnea) 01/20/2019   Acute renal failure superimposed on stage 3a chronic kidney disease ([place]) 01/20/2019   Chronic sinusitis 01/20/2019   Acute on chronic respiratory failure ([place]) 01/18/2019   Failure to thrive in adult 01/18/2019   Major depression, chronic 01/18/2019   Class 2 obesity with body mass index (BMI) of 36.0 to 36.9 in adult 01/18/2019   History of COVID-19 12/27/2018   Acute respiratory failure with hypoxia ([place]) 12/27/2018   CKD (chronic kidney disease) stage 3, GFR 30-59 ml/min ([place]) 12/27/2018   CHF (congestive heart  failure) ([place]) 12/27/2018   Acute blood loss anemia    Melena 06/16/2018   QT prolongation 06/15/2018   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   Spinal stenosis, thoracic 02/16/2015   Thoracic stenosis 02/16/2015   GI bleed 04/29/2014   Acute kidney injury superimposed on CKD ([place]) 04/29/2014   TIA (transient ischemic attack) 07/08/2012   Diverticulitis 07/08/2012   Hypertension 07/08/2012   PCP:  Nino Parsley, DO Pharmacy:   [id number] [place]Phone: [phone]: [address]Phone: [phone]: [phone]    Social Determinants of Health (SDOH) Social History: SDOH Screenings   Food Insecurity: Unknown (03/03/2022)  Transportation Needs: Unknown (03/03/2022)  Utilities: Unknown (03/03/2022)  Depression (PHQ2-9): Low Risk  (03/22/2019)  Physical Activity: Unknown (12/28/2018)  Social Connections: Unknown (12/28/2018)  Stress: No Stress Concern Present (12/28/2018)  Tobacco Use: Low Risk  (03/04/2022)   SDOH Interventions:     Readmission Risk Interventions     No data to display

## 2022-03-04 NOTE — Op Note (Signed)
[facility] Patient Name: Kurt [name] Procedure Date: 03/04/2022 10:30 AM MRN: [id number] Account #: [id number] Date of Birth: [birth year] Admit Type: Inpatient Age: 72 Room: [facility] ENDO ROOM 2 Gender: Male Note Status: Finalized Instrument Name: Upper Endoscope [id number] Procedure:             Upper GI endoscopy Indications:           Acute post hemorrhagic anemia, Melena Providers:             Lin Landsman MD, MD Referring MD:          Nino Parsley (Referring MD) Medicines:             General Anesthesia Complications:         No immediate complications. Estimated blood loss: None. Procedure:             Pre-Anesthesia Assessment:                        - Prior to the procedure, a History and Physical was                         performed, and patient medications and allergies were                         reviewed. The patient is competent. The risks and                         benefits of the procedure and the sedation options and                         risks were discussed with the patient. All questions                         were answered and informed consent was obtained.                         Patient identification and proposed procedure were                         verified by the physician, the nurse, the                         anesthesiologist, the anesthetist and the technician                         in the pre-procedure area in the procedure room in the                         endoscopy suite. Mental Status Examination: alert and                         oriented. Airway Examination: normal oropharyngeal                         airway and neck mobility. Respiratory Examination:                         clear to auscultation. CV Examination: normal.  Prophylactic Antibiotics: The patient does not require                         prophylactic antibiotics. Prior Anticoagulants: The                          patient has taken no anticoagulant or antiplatelet                         agents. ASA Grade Assessment: III - A patient with                         severe systemic disease. After reviewing the risks and                         benefits, the patient was deemed in satisfactory                         condition to undergo the procedure. The anesthesia                         plan was to use general anesthesia. Immediately prior                         to administration of medications, the patient was                         re-assessed for adequacy to receive sedatives. The                         heart rate, respiratory rate, oxygen saturations,                         blood pressure, adequacy of pulmonary ventilation, and                         response to care were monitored throughout the                         procedure. The physical status of the patient was                         re-assessed after the procedure.                        After obtaining informed consent, the endoscope was                         passed under direct vision. Throughout the procedure,                         the patient's blood pressure, pulse, and oxygen                         saturations were monitored continuously. The Endoscope                         was introduced through the mouth, and advanced to the  third part of duodenum. The upper GI endoscopy was                         accomplished without difficulty. The patient tolerated                         the procedure well. Findings:      One oozing cratered duodenal ulcer with an adherent clot (Forrest Class       IIb) was found in the second portion of the duodenum along the sweep.       The lesion was 3 mm in largest dimension. Fulguration to stop the       bleeding by bipolar probe was successful. Estimated blood loss: none.      One non-bleeding superficial gastric ulcer with a clean ulcer base       (Forrest Class  III) was found in the prepyloric region of the stomach.       The lesion was 5 mm in largest dimension.      Diffuse moderately erythematous mucosa without bleeding was found in the       entire examined stomach. Biopsies were taken with a cold forceps for       Helicobacter pylori testing.      The cardia and gastric fundus were normal on retroflexion.      The gastroesophageal junction and examined esophagus were normal. Impression:            - Oozing duodenal ulcer with an adherent clot (Forrest                         Class IIb). Treated with bipolar cautery.                        - Non-bleeding gastric ulcer with a clean ulcer base                         (Forrest Class III).                        - Erythematous mucosa in the stomach. Biopsied.                        - Normal gastroesophageal junction and esophagus. Recommendation:        - Return patient to hospital ward for possible                         discharge same day.                        - Resume regular diet today.                        - Continue present medications.                        - Use Prilosec (omeprazole) 40 mg PO BID for the rest                         of the patient's life.                        -  Await pathology results. Procedure Code(s):     --- Professional ---                        [phone], 37, Esophagogastroduodenoscopy, flexible,                         transoral; with control of bleeding, any method                        [id number], Esophagogastroduodenoscopy, flexible,                         transoral; with biopsy, single or multiple Diagnosis Code(s):     --- Professional ---                        K26.4, Chronic or unspecified duodenal ulcer with                         hemorrhage                        K25.9, Gastric ulcer, unspecified as acute or chronic,                         without hemorrhage or perforation                        D62, Acute posthemorrhagic anemia                         K92.1, Melena (includes Hematochezia)                        K31.89, Other diseases of stomach and duodenum CPT copyright 2022 American Medical Association. All rights reserved. The codes documented in this report are preliminary and upon coder review may  be revised to meet current compliance requirements. Dr. Ulyess Mort Lin Landsman MD, MD 03/04/2022 10:52:53 AM This report has been signed electronically. Number of Addenda: 0 Note Initiated On: 03/04/2022 10:30 AM Estimated Blood Loss:  Estimated blood loss: none.      [facility]

## 2022-03-04 NOTE — Anesthesia Postprocedure Evaluation (Signed)
Anesthesia Post Note  Patient: Kurt [name]  Procedure(s) Performed: ESOPHAGOGASTRODUODENOSCOPY (EGD) WITH PROPOFOL  Patient location during evaluation: PACU Anesthesia Type: General Level of consciousness: awake and alert Pain management: pain level controlled Vital Signs Assessment: post-procedure vital signs reviewed and stable Respiratory status: spontaneous breathing, nonlabored ventilation and respiratory function stable Cardiovascular status: blood pressure returned to baseline and stable Postop Assessment: no apparent nausea or vomiting Anesthetic complications: no   No notable events documented.   Last Vitals:  Vitals:   03/04/22 1104 03/04/22 1114  BP: 97/68 128/67  Pulse: 81 75  Resp: 16 13  Temp:  (!) 36.2 C  SpO2: 98% 98%    Last Pain:  Vitals:   03/04/22 1114  TempSrc: Temporal  PainSc: 0-No pain                 [place] Ouch

## 2022-03-04 NOTE — Progress Notes (Signed)
Initial Nutrition Assessment  DOCUMENTATION CODES:   Obesity unspecified  INTERVENTION:   -Boost Breeze po TID, each supplement provides 250 kcal and 9 grams of protein  -30 ml Prosource Plus BID, each supplement provides 100 kcals and 15 grams protein -MVI with minerals daily  NUTRITION DIAGNOSIS:   Increased nutrient needs related to acute illness as evidenced by estimated needs.  GOAL:   Patient will meet greater than or equal to 90% of their needs  MONITOR:   PO intake, Supplement acceptance  REASON FOR ASSESSMENT:   Malnutrition Screening Tool    ASSESSMENT:   Pt with medical history significant of duodenal ulcers, hypertension, HFpEF, nephrolithiasis, who presents with complaints of dark stool.  Pt admitted with acute upper GIB.   2/12- s/p EGD- s/p oozing duodenal ulcer with adherent clot (treated with bipolar cautery), erythematous mucosa in stomach (biopsied)  Reviewed I/O's: +11 ml x 24 hours  UOP: 775 ml x 24 hours  Per GI notes, plan to await pathology.   Pt sleeping soundly at time of visit. No family at bedside. Pt did not arouse to voice or touch.   Pt currently on a heart healthy diet. No meal completion data available to assess at this time. Noted pt does not like milky supplements and has refused them in the past.   Reviewed wt hx; pt has experienced a 1.5% wt loss over the past 3 months, which is not significant for time frame. Noted pt experienced a 21% wt loss over the past year, which is significant for time frame.   Medications reviewed and include vitamin B-12.  Lab Results  Component Value Date   HGBA1C 5.9 (H) 06/15/2018   PTA DM medications are none.   Labs reviewed: CBGS: 98 (inpatient orders for glycemic control are none).    NUTRITION - FOCUSED PHYSICAL EXAM:  Flowsheet Row Most Recent Value  Orbital Region No depletion  Upper Arm Region No depletion  Thoracic and Lumbar Region No depletion  Buccal Region No depletion   Temple Region No depletion  Clavicle Bone Region No depletion  Clavicle and Acromion Bone Region No depletion  Scapular Bone Region No depletion  Dorsal Hand No depletion  Patellar Region No depletion  Anterior Thigh Region No depletion  Posterior Calf Region No depletion  Edema (RD Assessment) Mild  Hair Reviewed  Eyes Reviewed  Mouth Reviewed  Skin Reviewed  Nails Reviewed       Diet Order:   Diet Order             Diet 2 gram sodium Fluid consistency: Thin  Diet effective now                   EDUCATION NEEDS:   No education needs have been identified at this time  Skin:  Skin Assessment: Reviewed RN Assessment  Last BM:  03/03/22  Height:   Ht Readings from Last 1 Encounters:  03/02/22 5' 10"$  (1.778 m)    Weight:   Wt Readings from Last 1 Encounters:  03/03/22 92.9 kg    Ideal Body Weight:  75.5 kg  BMI:  Body mass index is 29.37 kg/m.  Estimated Nutritional Needs:   Kcal:  2050-2250  Protein:  115-130 grams  Fluid:  > 2 L    Loistine Chance, RD, LDN, [place] Registered Dietitian II Certified Diabetes Care and Education Specialist Please refer to [facility] for RD and/or RD on-call/weekend/after hours pager

## 2022-03-04 NOTE — Transfer of Care (Signed)
Immediate Anesthesia Transfer of Care Note  Patient: Kurt [name]  Procedure(s) Performed: ESOPHAGOGASTRODUODENOSCOPY (EGD) WITH PROPOFOL  Patient Location: Endoscopy Unit  Anesthesia Type:General  Level of Consciousness: drowsy  Airway & Oxygen Therapy: Patient Spontanous Breathing  Post-op Assessment: Report given to RN and Post -op Vital signs reviewed and stable  Post vital signs: Reviewed and stable  Last Vitals:  Vitals Value Taken Time  BP    Temp    Pulse    Resp    SpO2      Last Pain:  Vitals:   03/03/22 2353  TempSrc: Oral  PainSc: 0-No pain         Complications: No notable events documented.

## 2022-03-05 ENCOUNTER — Encounter: Payer: Self-pay | Admitting: Gastroenterology

## 2022-03-06 LAB — SURGICAL PATHOLOGY

## 2022-03-07 ENCOUNTER — Telehealth: Payer: Self-pay

## 2022-03-07 NOTE — Telephone Encounter (Signed)
-----   Message from Lin Landsman, MD sent at 03/07/2022  4:51 PM EST ----- Caryl Pina  Please inform patient that the pathology results from recent upper endoscopy did not reveal any infection.  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  Please send in a prescription for 3 months and 3 refills.  I will see him for follow-up as scheduled  Rohini Vanga

## 2022-03-07 NOTE — Telephone Encounter (Signed)
Called and left a message for call back  

## 2022-03-08 MED ORDER — OMEPRAZOLE 40 MG PO CPDR: 40.0000 mg | DELAYED_RELEASE_CAPSULE | Freq: Two times a day (BID) | ORAL | 3 refills | Status: DC

## 2022-03-08 NOTE — Telephone Encounter (Signed)
Called patient sister [name] which is on patient DPR form for all [place] [facility]. Informed her that I was trying to reach her brother to go over some results. She states that Boykin Nearing is patient power attorney and to call him. Found the power attorney paper work under [employer]. Called and Gave him the results and he verbalized understanding. He will call [facility] to make sure there is no trouble getting the medication. Re scanned power of attorney paper work under documents and put in a [place]

## 2022-03-08 NOTE — Telephone Encounter (Signed)
Called and left a message for call back. Sent medication to the pharmacy   

## 2022-04-10 ENCOUNTER — Other Ambulatory Visit: Payer: Self-pay

## 2022-04-16 ENCOUNTER — Encounter: Payer: Self-pay | Admitting: Gastroenterology

## 2022-04-16 ENCOUNTER — Ambulatory Visit (INDEPENDENT_AMBULATORY_CARE_PROVIDER_SITE_OTHER): Payer: Medicare (Managed Care) | Admitting: Gastroenterology

## 2022-04-16 VITALS — BP 114/69 | HR 57 | Temp 97.9°F | Ht 70.0 in | Wt 236.0 lb

## 2022-04-16 DIAGNOSIS — K269 Duodenal ulcer, unspecified as acute or chronic, without hemorrhage or perforation: Secondary | ICD-10-CM | POA: Diagnosis not present

## 2022-04-16 NOTE — Progress Notes (Signed)
Kurt Darby, MD [address]  [place]  [place], [place] [address]  Main: [phone]  Fax: [phone]    Gastroenterology Consultation  Referring Provider:     Velta Addison, Claretha Cooper, DO Primary Care Physician:  Velta Addison, Claretha Cooper, DO Primary Gastroenterologist:  Dr. Cephas [name] Reason for Consultation: Follow-up of duodenal ulcer  HPI:   Kurt [name] is a 72 y.o. male referred by Dr. Velta Addison, Claretha Cooper, DO  for consultation & management of history of recurrent duodenal ulcer with acute blood loss anemia.  Patient has history of hypertension, osteoarthritis, heavy NSAID use, bleeding from duodenal ulcer in 2016 as well as in 2020, no evidence of H. pylori, thought to be NSAID induced was admitted to [facility] in early February secondary to 2 days history of melena.  Underwent EGD on 03/04/2022 which revealed oozing duodenal ulcer with adherent clot which was treated with cautery.  Also had clean-based gastric ulcer.  There was no evidence of H. pylori.  Patient was discharged home on omeprazole 40 mg twice daily.  He stopped NSAID use.  He was taking oxycodone for arthritis pain which resulted in severe constipation.  He finally decided to stop opioid medication because of severe constipation.  He is currently taking MiraLAX and having regular bowel movements.  Patient reports that he had blood work done about a week ago and he was told that his hemoglobin was 13, normal liver and kidney function.  His B12 levels are also improving.  He is not taking iron, taking B12 supplements daily.  Apparently, he has been taking Tylenol 650 mg 2 tablets twice daily for arthritis pain.  NSAIDs: In the past secondary to arthritis pain  Antiplts/Anticoagulants/Anti thrombotics: None  GI Procedures:  Upper endoscopy 03/04/2022  DIAGNOSIS:  A. STOMACH, RANDOM; COLD BIOPSY:  - MILD REACTIVE GASTRITIS.  - NEGATIVE FOR H. PYLORI, INTESTINAL METAPLASIA, DYSPLASIA, AND  MALIGNANCY.  Past Medical History:   Diagnosis Date   Arthritis    CHF (congestive heart failure) (HCC)    diastolic dysfunction   Chronic back pain    COVID-19    Depression    Diverticulitis    Duodenal ulcer 05/2018   Treated with 3 clips/Dr. Perry   Fall off scafolding 2003   prolonged hospitalization- multiple BUE surgery, concussion   GI bleed 05/2018   GIB (gastrointestinal bleeding) 2016   Antral ulcer/duodenal bleed per Endoscopy by Dr. Benson Norway   Hypertension    PTSD (post-traumatic stress disorder)    after  a Fall    Renal insufficiency    Decrease in GFR- PCP removed all NSAIDS & aspirin     Past Surgical History:  Procedure Laterality Date   BIOPSY  06/16/2018   Procedure: BIOPSY;  Surgeon: Irene Shipper, MD;  Location: [facility] ENDOSCOPY;  Service: Endoscopy;;   ELBOW FRACTURE SURGERY     ORIF, Shoulder manipulation     ESOPHAGOGASTRODUODENOSCOPY N/A 04/30/2014   Procedure: ESOPHAGOGASTRODUODENOSCOPY (EGD);  Surgeon: Carol Ada, MD;  Location: [facility] ENDOSCOPY;  Service: Endoscopy;  Laterality: N/A;   ESOPHAGOGASTRODUODENOSCOPY (EGD) WITH PROPOFOL N/A 06/16/2018   Procedure: ESOPHAGOGASTRODUODENOSCOPY (EGD) WITH PROPOFOL;  Surgeon: Irene Shipper, MD;  Location: [facility] ENDOSCOPY;  Service: Endoscopy;  Laterality: N/A;   ESOPHAGOGASTRODUODENOSCOPY (EGD) WITH PROPOFOL N/A 03/04/2022   Procedure: ESOPHAGOGASTRODUODENOSCOPY (EGD) WITH PROPOFOL;  Surgeon: Lin Landsman, MD;  Location: [place];  Service: Gastroenterology;  Laterality: N/A;   EXTRACORPOREAL SHOCK WAVE LITHOTRIPSY Right 11/08/2021  Procedure: EXTRACORPOREAL SHOCK WAVE LITHOTRIPSY (ESWL);  Surgeon: Billey Co, MD;  Location: [facility] ORS;  Service: Urology;  Laterality: Right;   HEMOSTASIS CLIP PLACEMENT  06/16/2018   Procedure: HEMOSTASIS CLIP PLACEMENT;  Surgeon: Irene Shipper, MD;  Location: [facility] ENDOSCOPY;  Service: Endoscopy;;   LUMBAR LAMINECTOMY/DECOMPRESSION MICRODISCECTOMY N/A 02/16/2015   Procedure: Thoracic ten - thoracic twelve  laminectomy;  Surgeon: Earnie Larsson, MD;  Location: [place] NEURO ORS;  Service: Neurosurgery;  Laterality: N/A;   LUMBAR LAMINECTOMY/DECOMPRESSION MICRODISCECTOMY N/A 03/22/2016   Procedure: Lumbar One-Two, Lumbar Two-Three, Lumbar Three-Four Laminectomy and Foraminotomy;  Surgeon: Earnie Larsson, MD;  Location: [place];  Service: Neurosurgery;  Laterality: N/A;   NASAL SEPTUM SURGERY     ORIF ACETABULAR FRACTURE Right 02/06/2021   Procedure: 1. OPEN REDUCTION INTERNAL FIXATION (ORIF) RIGHT  ACETABULAR FRACTURE USING STOPPA APPROACH 2. REMOVAL OF TRACTION PIN RIGHT TIBIA;  Surgeon: Altamese McCutchenville, MD;  Location: [place];  Service: Orthopedics;  Laterality: Right;   SACRO-ILIAC PINNING Bilateral 02/06/2021   Procedure: 3.LEFT AND RIGHT (TRANS) SACROILIAC SCREW FIXATION;  Surgeon: Altamese Heidelberg, MD;  Location: [place];  Service: Orthopedics;  Laterality: Bilateral;   SCLEROTHERAPY  06/16/2018   Procedure: SCLEROTHERAPY;  Surgeon: Irene Shipper, MD;  Location: [facility] ENDOSCOPY;  Service: Endoscopy;;   VIDEO BRONCHOSCOPY Bilateral 02/18/2019   Procedure: VIDEO BRONCHOSCOPY WITHOUT FLUORO;  Surgeon: Margaretha Seeds, MD;  Location: [place];  Service: Pulmonary;  Laterality: Bilateral;   WOUND EXPLORATION N/A 02/06/2021   Procedure: 4. APPLICATION OF  WOUND, SMALL;  Surgeon: Altamese Lodi, MD;  Location: [place];  Service: Orthopedics;  Laterality: N/A;     Current Outpatient Medications:    acetaminophen (TYLENOL) 650 MG CR tablet, Take 1,300 mg by mouth 2 (two) times daily., Disp: , Rfl:    allopurinol (ZYLOPRIM) 100 MG tablet, Take 100 mg by mouth 2 (two) times a day., Disp: , Rfl:    amLODipine (NORVASC) 2.5 MG tablet, Take 2.5 mg by mouth daily., Disp: , Rfl:    atenolol (TENORMIN) 25 MG tablet, Take 25 mg by mouth daily., Disp: , Rfl:    atorvastatin (LIPITOR) 40 MG tablet, Take 40 mg by mouth daily., Disp: , Rfl:    carvedilol (COREG) 3.125 MG tablet, Take 1 tablet (3.125 mg total) by mouth 2 (two) times daily  with a meal., Disp: 60 tablet, Rfl: 0   cyanocobalamin 1000 MCG tablet, Take 1 tablet (1,000 mcg total) by mouth daily., Disp: 90 tablet, Rfl: 1   ferrous sulfate 324 (65 Fe) MG TBEC, Take by mouth., Disp: , Rfl:    furosemide (LASIX) 80 MG tablet, Take 80 mg by mouth daily., Disp: , Rfl:    LYRICA 25 MG capsule, Take 25 mg by mouth 2 (two) times daily., Disp: , Rfl:    melatonin 3 MG TABS tablet, Take 6 mg by mouth at bedtime., Disp: , Rfl:    Multiple Vitamin (MULTIVITAMIN WITH MINERALS) TABS tablet, Take 1 tablet by mouth daily., Disp: 90 tablet, Rfl: 1   omeprazole (PRILOSEC) 40 MG capsule, Take 1 capsule (40 mg total) by mouth 2 (two) times daily before a meal., Disp: 180 capsule, Rfl: 3   polyethylene glycol (MIRALAX / GLYCOLAX) 17 g packet, Take 17 g by mouth daily as needed for mild constipation., Disp: 14 each, Rfl: 0   sertraline (ZOLOFT) 100 MG tablet, Take 2 tablets (200 mg total) by mouth every morning., Disp: , Rfl:    tamsulosin (FLOMAX) 0.4 MG CAPS capsule,  Take 1 capsule (0.4 mg total) by mouth daily after supper., Disp: 14 capsule, Rfl: 0   traZODone (DESYREL) 50 MG tablet, Take 50 mg by mouth at bedtime., Disp: , Rfl:    No family history on file.   Social History   Tobacco Use   Smoking status: Never   Smokeless tobacco: Never  Vaping Use   Vaping Use: Never used  Substance Use Topics   Alcohol use: Not Currently    Comment: quit in 1989    Drug use: No    Allergies as of 04/16/2022 - Review Complete 04/16/2022  Allergen Reaction Noted   Lyrica [pregabalin] Other (See Comments) 05/17/2019    Review of Systems:    All systems reviewed and negative except where noted in HPI.   Physical Exam:  BP 114/69 (BP Location: Right Arm, Patient Position: Sitting, Cuff Size: Normal)   Pulse (!) 57   Temp 97.9 F (36.6 C) (Oral)   Ht 5\' 10"  (1.778 m)   Wt 236 lb (107 kg)   BMI 33.86 kg/m  No LMP for male patient.  General:   Alert,  Well-developed,  well-nourished, pleasant and cooperative in NAD Head:  Normocephalic and atraumatic. Eyes:  Sclera clear, no icterus.   Conjunctiva pink. Ears:  Normal auditory acuity. Nose:  No deformity, discharge, or lesions. Mouth:  No deformity or lesions,oropharynx pink & moist. Neck:  Supple; no masses or thyromegaly. Lungs:  Respirations even and unlabored.  Clear throughout to auscultation.   No wheezes, crackles, or rhonchi. No acute distress. Heart:  Regular rate and rhythm; no murmurs, clicks, rubs, or gallops. Abdomen:  Normal bowel sounds. Soft, non-tender and non-distended without masses, hepatosplenomegaly or hernias noted.  No guarding or rebound tenderness.   Rectal: Not performed Msk:  Symmetrical without gross deformities.  Presented in wheelchair due to lower extremity weakness Pulses:  Normal pulses noted. Extremities:  No clubbing or edema.  No cyanosis. Neurologic:  Alert and oriented x3;  grossly normal neurologically. Skin:  Intact without significant lesions or rashes. No jaundice. Psych:  Alert and cooperative. Normal mood and affect.  Imaging Studies: Reviewed  Assessment and Plan:   DELONTAE SALIB is a 72 y.o. male with history of hypertension, severe osteoarthritis, history of recurrent bleeding from duodenal ulcer secondary to NSAID use presented with upper GI bleed secondary to recurrent bleeding from NSAID induced duodenal ulcer.  Patient has stopped taking NSAIDs Continue omeprazole 40 mg twice daily for 1 more month, then decrease to 40 mg once a day indefinitely Also, advised patient to cut back on Tylenol use to 650 mg 1 pill twice daily only   Follow up as needed   Kurt Darby, MD

## 2022-05-07 ENCOUNTER — Other Ambulatory Visit: Payer: Self-pay | Admitting: Family Medicine

## 2022-05-07 DIAGNOSIS — M25511 Pain in right shoulder: Secondary | ICD-10-CM

## 2022-05-16 ENCOUNTER — Ambulatory Visit
Admission: RE | Admit: 2022-05-16 | Discharge: 2022-05-16 | Disposition: A | Discharge status: Home / Self Care | Payer: Medicare (Managed Care) | Source: Ambulatory Visit | Attending: Family Medicine | Admitting: Family Medicine

## 2022-05-16 DIAGNOSIS — M25511 Pain in right shoulder: Secondary | ICD-10-CM | POA: Insufficient documentation

## 2022-11-26 ENCOUNTER — Ambulatory Visit
Admission: RE | Admit: 2022-11-26 | Discharge: 2022-11-26 | Disposition: A | Discharge status: Home / Self Care | Payer: Medicare (Managed Care) | Source: Ambulatory Visit | Attending: Physician Assistant

## 2022-11-26 ENCOUNTER — Ambulatory Visit
Admission: RE | Admit: 2022-11-26 | Discharge: 2022-11-26 | Disposition: A | Discharge status: Home / Self Care | Payer: Medicare (Managed Care) | Attending: Physician Assistant | Admitting: Physician Assistant

## 2022-11-26 DIAGNOSIS — N200 Calculus of kidney: Secondary | ICD-10-CM | POA: Insufficient documentation

## 2022-11-29 ENCOUNTER — Ambulatory Visit: Payer: Medicare (Managed Care) | Admitting: Physician Assistant

## 2022-11-29 ENCOUNTER — Encounter: Payer: Self-pay | Admitting: Physician Assistant

## 2022-11-29 VITALS — BP 113/69 | HR 77 | Ht 70.0 in | Wt 242.0 lb

## 2022-11-29 DIAGNOSIS — N200 Calculus of kidney: Secondary | ICD-10-CM | POA: Diagnosis not present

## 2022-11-29 LAB — MICROSCOPIC EXAMINATION

## 2022-11-29 LAB — URINALYSIS, COMPLETE
Bilirubin, UA: NEGATIVE
Glucose, UA: NEGATIVE
Ketones, UA: NEGATIVE
Leukocytes,UA: NEGATIVE
Nitrite, UA: NEGATIVE
Protein,UA: NEGATIVE
RBC, UA: NEGATIVE
Specific Gravity, UA: 1.015 (ref 1.005–1.030)
Urobilinogen, Ur: 1 mg/dL (ref 0.2–1.0)
pH, UA: 6 (ref 5.0–7.5)
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
## 2022-12-03 ENCOUNTER — Other Ambulatory Visit: Payer: Self-pay

## 2022-12-03 DIAGNOSIS — N200 Calculus of kidney: Secondary | ICD-10-CM

## 2022-12-03 NOTE — Progress Notes (Signed)
Surgical Physician Order Form [facility] [place]  Dr. Apolinar Junes * Scheduling expectation : Per patient preference, anticipate early 2025  *Length of Case:   *Clearance needed: no  *Anticoagulation Instructions: N/A  *Aspirin Instructions: N/A  *Post-op visit Date/Instructions:  TBD  *Diagnosis: Right Nephrolithiasis  *Procedure: right Ureteroscopy w/laser lithotripsy & stent placement ([address])   Additional orders: N/A  -Admit type: OUTpatient  -Anesthesia: General  -VTE Prophylaxis Standing Order SCD's       Other:   -Standing Lab Orders Per Anesthesia    Lab other: UA&Urine Culture  -Standing Test orders EKG/Chest x-ray per Anesthesia       Test other:   - Medications:  Ancef 2gm IV  -Other orders:  N/A

## 2022-12-17 ENCOUNTER — Telehealth: Payer: Self-pay

## 2022-12-17 NOTE — Progress Notes (Signed)
   [facility] Posting Form  Surgery Date: Date: 01/26/2022  Surgeon: Dr. Vanna Scotland, MD  Inpt ( No  )   Outpt (Yes)   Obs ( No  )   Diagnosis: N20.0 Right Nephrolithiasis  -CPT: [phone]  Surgery: Right Ureteroscopy with Laser Lithotripsy and Stent Placement  Stop Anticoagulations: No  Cardiac/Medical/Pulmonary Clearance needed: no  *Orders entered into EPIC  Date: 12/17/22   *Case booked in [place]  Date: 12/17/22  *Notified pt of Surgery: Date: 12/17/22  PRE-OP UA & CX: yes, will obtain in clinic on 01/17/2023  *Placed into Prior Authorization Work Angela Nevin Date: 12/17/22  Assistant/laser/rep:No

## 2022-12-17 NOTE — Telephone Encounter (Signed)
Per Dr. Apolinar Junes,  Patient is to be scheduled for  Right Ureteroscopy with Laser Lithotripsy and Stent Placement   Mr. Arth POA and [facility] was contacted and possible surgical dates were discussed, Monday January 6th, 2024 was agreed upon for surgery.   Patient was instructed that Dr. Apolinar Junes will require them to provide a pre-op UA & CX prior to surgery. This was ordered and scheduled drop off appointment was made for 01/17/2023.    Patient was directed to call [phone] between 1-3pm the day before surgery to find out surgical arrival time.  Instructions were given not to eat or drink from midnight on the night before surgery and have a driver for the day of surgery. On the surgery day patient was instructed to enter through the Medical Mall entrance of [facility] report the Same Day Surgery desk.   Pre-Admit Testing will be in contact via phone to set up an interview with the anesthesia team to review your history and medications prior to surgery.   Reminder of this information was sent via MyChart to the patient.

## 2023-01-17 ENCOUNTER — Other Ambulatory Visit: Payer: Medicare (Managed Care)

## 2023-01-17 DIAGNOSIS — N200 Calculus of kidney: Secondary | ICD-10-CM

## 2023-01-17 LAB — URINALYSIS, COMPLETE
Bilirubin, UA: NEGATIVE
Glucose, UA: NEGATIVE
Ketones, UA: NEGATIVE
Leukocytes,UA: NEGATIVE
Nitrite, UA: NEGATIVE
Protein,UA: NEGATIVE
RBC, UA: NEGATIVE
Specific Gravity, UA: 1.015 (ref 1.005–1.030)
Urobilinogen, Ur: 0.2 mg/dL (ref 0.2–1.0)
pH, UA: 6.5 (ref 5.0–7.5)

## 2023-01-17 LAB — MICROSCOPIC EXAMINATION

## 2023-01-20 ENCOUNTER — Encounter
Admission: RE | Admit: 2023-01-20 | Discharge: 2023-01-20 | Disposition: A | Discharge status: Home / Self Care | Payer: Medicare (Managed Care) | Source: Ambulatory Visit | Attending: Urology | Admitting: Urology

## 2023-01-20 ENCOUNTER — Other Ambulatory Visit: Payer: Self-pay

## 2023-01-20 VITALS — Ht 66.0 in | Wt 225.0 lb

## 2023-01-20 DIAGNOSIS — I5032 Chronic diastolic (congestive) heart failure: Secondary | ICD-10-CM

## 2023-01-20 DIAGNOSIS — Z01812 Encounter for preprocedural laboratory examination: Secondary | ICD-10-CM

## 2023-01-20 DIAGNOSIS — G4733 Obstructive sleep apnea (adult) (pediatric): Secondary | ICD-10-CM

## 2023-01-20 HISTORY — DX: Spinal stenosis, lumbar region with neurogenic claudication: M48.062

## 2023-01-20 HISTORY — DX: Fracture of unspecified parts of lumbosacral spine and pelvis, initial encounter for closed fracture: S32.9XXA

## 2023-01-20 HISTORY — DX: Spinal stenosis, thoracic region: M48.04

## 2023-01-20 HISTORY — DX: Obstructive sleep apnea (adult) (pediatric): G47.33

## 2023-01-20 HISTORY — DX: Carpal tunnel syndrome, bilateral upper limbs: G56.03

## 2023-01-20 HISTORY — DX: Chronic sinusitis, unspecified: J32.9

## 2023-01-20 HISTORY — DX: Other fracture of left lower leg, initial encounter for closed fracture: S82.892A

## 2023-01-20 HISTORY — DX: Chronic kidney disease, stage 3 unspecified: N18.30

## 2023-01-20 HISTORY — DX: Other lesions of median nerve, left upper limb: G56.12

## 2023-01-20 HISTORY — DX: Abnormal electrocardiogram (ECG) (EKG): R94.31

## 2023-01-20 HISTORY — DX: Dependence on supplemental oxygen: Z99.81

## 2023-01-20 HISTORY — DX: Lesion of ulnar nerve, unspecified upper limb: G56.20

## 2023-01-20 HISTORY — DX: Solitary pulmonary nodule: R91.1

## 2023-01-20 LAB — CULTURE, URINE COMPREHENSIVE

## 2023-01-20 NOTE — Pre-Procedure Instructions (Signed)
Patient's brother Kurt [name]) is the HPOA and spoke with this nurse regarding the arrangements for [facility] to and from the hospital for both an EKG and the procedure. Kurt [name] received the patient instructions for the procedure and verbalized understanding and stated he would explain to the patient what needs to happen before the procedure. The brother stated the sister Kurt [name] would be present at the hospital with the patient and would stay at the patient's home after the procedure for 24 hours. After the PAT interview Kurt [name] had no questions regarding the preparation for the procedure. Dr. Clovis Pu from [facility] stated she would send the latest lab results from their facility for the patient. Awaiting the results.

## 2023-01-20 NOTE — Patient Instructions (Addendum)
Your procedure is scheduled IO:NGEXBM January 6  Report to the Registration Desk on the 1st floor of the [facility] To find out your arrival time, please call [phone] between 1PM - 3PM on: Friday January 3  If your arrival time is 6:00 am, do not arrive before that time as the Medical Mall entrance doors do not open until 6:00 am.  REMEMBER: Instructions that are not followed completely may result in serious medical risk, up to and including death; or upon the discretion of your surgeon and anesthesiologist your surgery may need to be rescheduled.  Do not eat food after midnight the night before surgery.  No gum chewing or hard candies.   One week prior to surgery:Monday December 31 Stop Anti-inflammatories (NSAIDS) such as Advil, Aleve, Ibuprofen, Motrin, Naproxen, Naprosyn and Aspirin based products such as Excedrin, Goody's Powder, BC Powder. Stop ANY OVER THE COUNTER supplements until after surgery.  You may however, continue to take Tylenol if needed for pain up until the day of surgery.   Continue taking all of your other prescription medications up until the day of surgery.  ON THE DAY OF SURGERY ONLY TAKE THESE MEDICATIONS WITH SIPS OF WATER:  allopurinol (ZYLOPRIM)  amLODipine (NORVASC)  atenolol (TENORMIN)  atorvastatin (LIPITOR)  cyanocobalamin B12 LYRICA  Multiple Vitamin (MULTIVITAMIN WITH MINERALS)  omeprazole (PRILOSEC)  sertraline (ZOLOFT)   No Alcohol for 24 hours before or after surgery.  No Smoking including e-cigarettes for 24 hours before surgery.  No chewable tobacco products for at least 6 hours before surgery.  No nicotine patches on the day of surgery.  Do not use any "recreational" drugs for at least a week (preferably 2 weeks) before your surgery.  Please be advised that the combination of cocaine and anesthesia may have negative outcomes, up to and including death. If you test positive for cocaine, your surgery will be cancelled.  On  the morning of surgery brush your teeth with toothpaste and water, you may rinse your mouth with mouthwash if you wish. Do not swallow any toothpaste or mouthwash.  Do not wear jewelry, make-up, hairpins, clips or nail polish.  For welded (permanent) jewelry: bracelets, anklets, waist bands, etc.  Please have this removed prior to surgery.  If it is not removed, there is a chance that hospital personnel will need to cut it off on the day of surgery.  Do not wear lotions, powders, or perfumes.   Do not shave body hair from the neck down 48 hours before surgery.  Contact lenses, hearing aids and dentures may not be worn into surgery.  Do not bring valuables to the hospital. [facility] is not responsible for any missing/lost belongings or valuables.   Notify your doctor if there is any change in your medical condition (cold, fever, infection).  Wear comfortable clothing (specific to your surgery type) to the hospital.  After surgery, you can help prevent lung complications by doing breathing exercises.  Take deep breaths and cough every 1-2 hours.   If you are being discharged the day of surgery, you will not be allowed to drive home. You will need a responsible individual to drive you home and stay with you for 24 hours after surgery.   If you are taking public transportation, you will need to have a responsible individual with you.  Please call the Pre-admissions Testing Dept. at [phone] if you have any questions about these instructions.  Surgery Visitation Policy:  Patients having surgery or a procedure  may have two visitors.  Children under the age of 15 must have an adult with them who is not the patient.

## 2023-01-20 NOTE — Pre-Procedure Instructions (Signed)
Patient instructions were printed and mailed to both [place] and [employer].

## 2023-01-23 ENCOUNTER — Inpatient Hospital Stay: Admission: RE | Admit: 2023-01-23 | Payer: Medicare (Managed Care) | Source: Ambulatory Visit

## 2023-01-24 ENCOUNTER — Encounter
Admission: RE | Admit: 2023-01-24 | Discharge: 2023-01-24 | Disposition: A | Discharge status: Home / Self Care | Payer: Medicare (Managed Care) | Source: Ambulatory Visit | Attending: Urology | Admitting: Urology

## 2023-01-24 DIAGNOSIS — I5032 Chronic diastolic (congestive) heart failure: Secondary | ICD-10-CM | POA: Diagnosis not present

## 2023-01-24 DIAGNOSIS — G4733 Obstructive sleep apnea (adult) (pediatric): Secondary | ICD-10-CM | POA: Insufficient documentation

## 2023-01-24 DIAGNOSIS — Z01818 Encounter for other preprocedural examination: Secondary | ICD-10-CM | POA: Diagnosis present

## 2023-01-24 DIAGNOSIS — Z01812 Encounter for preprocedural laboratory examination: Secondary | ICD-10-CM

## 2023-01-24 DIAGNOSIS — Z01810 Encounter for preprocedural cardiovascular examination: Secondary | ICD-10-CM | POA: Diagnosis not present

## 2023-01-24 LAB — CBC
HCT: 43 % (ref 39.0–52.0)
Hemoglobin: 14.2 g/dL (ref 13.0–17.0)
MCH: 29.3 pg (ref 26.0–34.0)
MCHC: 33 g/dL (ref 30.0–36.0)
MCV: 88.7 fL (ref 80.0–100.0)
Platelets: 204 10*3/uL (ref 150–400)
RBC: 4.85 MIL/uL (ref 4.22–5.81)
RDW: 13.8 % (ref 11.5–15.5)
WBC: 5.5 10*3/uL (ref 4.0–10.5)
nRBC: 0 % (ref 0.0–0.2)

## 2023-01-24 LAB — BASIC METABOLIC PANEL
Anion gap: 9 (ref 5–15)
BUN: 26 mg/dL — ABNORMAL HIGH (ref 8–23)
CO2: 28 mmol/L (ref 22–32)
Calcium: 9 mg/dL (ref 8.9–10.3)
Chloride: 99 mmol/L (ref 98–111)
Creatinine, Ser: 0.89 mg/dL (ref 0.61–1.24)
GFR, Estimated: >60 mL/min (ref >60)
Glucose, Bld: 71 mg/dL (ref 70–99)
Potassium: 3.7 mmol/L (ref 3.5–5.1)
Sodium: 136 mmol/L (ref 135–145)

## 2023-01-26 MED ORDER — CEFAZOLIN SODIUM-DEXTROSE 2-4 GM/100ML-% IV SOLN
2.0000 g | INTRAVENOUS | Status: AC
  Administered 2023-01-27: 2 g via INTRAVENOUS

## 2023-01-26 MED ORDER — ORAL CARE MOUTH RINSE: 15.0000 mL | Freq: Once | OROMUCOSAL | Status: AC

## 2023-01-26 MED ORDER — CHLORHEXIDINE GLUCONATE 0.12 % MT SOLN
15.0000 mL | Freq: Once | OROMUCOSAL | Status: AC
  Administered 2023-01-27: 15 mL via OROMUCOSAL

## 2023-01-26 MED ORDER — LACTATED RINGERS IV SOLN: INTRAVENOUS | Status: DC

## 2023-01-27 ENCOUNTER — Other Ambulatory Visit: Payer: Self-pay

## 2023-01-27 ENCOUNTER — Ambulatory Visit: Payer: Medicare (Managed Care) | Admitting: Anesthesiology

## 2023-01-27 ENCOUNTER — Encounter: Payer: Self-pay | Admitting: Urology

## 2023-01-27 ENCOUNTER — Encounter
Admission: RE | Disposition: A | Discharge status: Home / Self Care | Payer: Self-pay | Source: Home / Self Care | Attending: Urology

## 2023-01-27 ENCOUNTER — Ambulatory Visit: Payer: Medicare (Managed Care)

## 2023-01-27 ENCOUNTER — Ambulatory Visit
Admission: RE | Admit: 2023-01-27 | Discharge: 2023-01-27 | Disposition: A | Discharge status: Home / Self Care | Payer: Medicare (Managed Care) | Attending: Urology | Admitting: Urology

## 2023-01-27 DIAGNOSIS — K219 Gastro-esophageal reflux disease without esophagitis: Secondary | ICD-10-CM | POA: Diagnosis not present

## 2023-01-27 DIAGNOSIS — N200 Calculus of kidney: Secondary | ICD-10-CM

## 2023-01-27 DIAGNOSIS — F32A Depression, unspecified: Secondary | ICD-10-CM | POA: Diagnosis not present

## 2023-01-27 DIAGNOSIS — I5030 Unspecified diastolic (congestive) heart failure: Secondary | ICD-10-CM | POA: Diagnosis not present

## 2023-01-27 DIAGNOSIS — I130 Hypertensive heart and chronic kidney disease with heart failure and stage 1 through stage 4 chronic kidney disease, or unspecified chronic kidney disease: Secondary | ICD-10-CM | POA: Diagnosis not present

## 2023-01-27 DIAGNOSIS — N1831 Chronic kidney disease, stage 3a: Secondary | ICD-10-CM | POA: Diagnosis not present

## 2023-01-27 DIAGNOSIS — K5730 Diverticulosis of large intestine without perforation or abscess without bleeding: Secondary | ICD-10-CM | POA: Insufficient documentation

## 2023-01-27 DIAGNOSIS — G8929 Other chronic pain: Secondary | ICD-10-CM | POA: Insufficient documentation

## 2023-01-27 HISTORY — PX: CYSTOSCOPY/URETEROSCOPY/HOLMIUM LASER/STENT PLACEMENT: SHX6546

## 2023-01-27 SURGERY — CYSTOSCOPY/URETEROSCOPY/HOLMIUM LASER/STENT PLACEMENT
Anesthesia: General | Site: Ureter | Laterality: Right

## 2023-01-27 MED ORDER — IOHEXOL 180 MG/ML  SOLN
INTRAMUSCULAR | Status: DC | PRN
  Administered 2023-01-27: 10 mL

## 2023-01-27 MED ORDER — DEXAMETHASONE SODIUM PHOSPHATE 10 MG/ML IJ SOLN
INTRAMUSCULAR | Status: AC
  Filled 2023-01-27: qty 3

## 2023-01-27 MED ORDER — EPHEDRINE SULFATE-NACL 50-0.9 MG/10ML-% IV SOSY
PREFILLED_SYRINGE | INTRAVENOUS | Status: DC | PRN
  Administered 2023-01-27 (×2): 5 mg via INTRAVENOUS
  Administered 2023-01-27: 10 mg via INTRAVENOUS
  Administered 2023-01-27: 5 mg via INTRAVENOUS
  Administered 2023-01-27: 10 mg via INTRAVENOUS

## 2023-01-27 MED ORDER — DEXAMETHASONE SODIUM PHOSPHATE 10 MG/ML IJ SOLN
INTRAMUSCULAR | Status: DC | PRN
  Administered 2023-01-27: 10 mg via INTRAVENOUS

## 2023-01-27 MED ORDER — CEFAZOLIN SODIUM-DEXTROSE 2-4 GM/100ML-% IV SOLN
INTRAVENOUS | Status: AC
  Filled 2023-01-27: qty 100

## 2023-01-27 MED ORDER — FENTANYL CITRATE (PF) 100 MCG/2ML IJ SOLN
INTRAMUSCULAR | Status: DC | PRN
  Administered 2023-01-27: 25 ug via INTRAVENOUS

## 2023-01-27 MED ORDER — LIDOCAINE HCL (CARDIAC) PF 100 MG/5ML IV SOSY
PREFILLED_SYRINGE | INTRAVENOUS | Status: DC | PRN
  Administered 2023-01-27: 100 mg via INTRAVENOUS

## 2023-01-27 MED ORDER — HYDROCODONE-ACETAMINOPHEN 5-325 MG PO TABS: 1.0000 | ORAL_TABLET | Freq: Four times a day (QID) | ORAL | 0 refills | Status: AC | PRN

## 2023-01-27 MED ORDER — OXYBUTYNIN CHLORIDE 5 MG PO TABS: 5.0000 mg | ORAL_TABLET | Freq: Three times a day (TID) | ORAL | 0 refills | Status: AC | PRN

## 2023-01-27 MED ORDER — EPHEDRINE 5 MG/ML INJ
INTRAVENOUS | Status: AC
  Filled 2023-01-27: qty 5

## 2023-01-27 MED ORDER — ONDANSETRON HCL 4 MG/2ML IJ SOLN
INTRAMUSCULAR | Status: DC | PRN
  Administered 2023-01-27: 4 mg via INTRAVENOUS

## 2023-01-27 MED ORDER — KETOROLAC TROMETHAMINE 30 MG/ML IJ SOLN
INTRAMUSCULAR | Status: AC
  Filled 2023-01-27: qty 1

## 2023-01-27 MED ORDER — FENTANYL CITRATE (PF) 100 MCG/2ML IJ SOLN
INTRAMUSCULAR | Status: AC
  Filled 2023-01-27: qty 2

## 2023-01-27 MED ORDER — CHLORHEXIDINE GLUCONATE 0.12 % MT SOLN
OROMUCOSAL | Status: AC
Start: 2023-01-27 — End: ?
  Filled 2023-01-27: qty 15

## 2023-01-27 MED ORDER — KETOROLAC TROMETHAMINE 30 MG/ML IJ SOLN
INTRAMUSCULAR | Status: DC | PRN
  Administered 2023-01-27: 15 mg via INTRAVENOUS

## 2023-01-27 MED ORDER — PROPOFOL 1000 MG/100ML IV EMUL
INTRAVENOUS | Status: AC
  Filled 2023-01-27: qty 100

## 2023-01-27 MED ORDER — SODIUM CHLORIDE 0.9 % IR SOLN
Status: DC | PRN
  Administered 2023-01-27: 3000 mL via INTRAVESICAL

## 2023-01-27 MED ORDER — ONDANSETRON HCL 4 MG/2ML IJ SOLN
INTRAMUSCULAR | Status: AC
  Filled 2023-01-27: qty 6

## 2023-01-27 MED ORDER — PHENYLEPHRINE 80 MCG/ML (10ML) SYRINGE FOR IV PUSH (FOR BLOOD PRESSURE SUPPORT)
PREFILLED_SYRINGE | INTRAVENOUS | Status: DC | PRN
  Administered 2023-01-27 (×3): 160 ug via INTRAVENOUS
  Administered 2023-01-27: 80 ug via INTRAVENOUS

## 2023-01-27 MED ORDER — FENTANYL CITRATE (PF) 100 MCG/2ML IJ SOLN: 25.0000 ug | INTRAMUSCULAR | Status: DC | PRN

## 2023-01-27 MED ORDER — PROPOFOL 10 MG/ML IV BOLUS
INTRAVENOUS | Status: DC | PRN
  Administered 2023-01-27: 130 mg via INTRAVENOUS

## 2023-01-27 MED ORDER — ONDANSETRON HCL 4 MG/2ML IJ SOLN: 4.0000 mg | Freq: Once | INTRAMUSCULAR | Status: DC | PRN

## 2023-01-27 SURGICAL SUPPLY — 23 items
ADHESIVE MASTISOL STRL (MISCELLANEOUS) IMPLANT
BAG DRAIN SIEMENS DORNER NS (MISCELLANEOUS) ×1 IMPLANT
BASKET ZERO TIP 1.9FR (BASKET) IMPLANT
BRUSH SCRUB EZ 1% IODOPHOR (MISCELLANEOUS) IMPLANT
CATH URET FLEX-TIP 2 LUMEN 10F (CATHETERS) IMPLANT
CATH URETL OPEN 5X70 (CATHETERS) ×1 IMPLANT
CNTNR URN SCR LID CUP LEK RST (MISCELLANEOUS) IMPLANT
DRAPE UTILITY 15X26 TOWEL STRL (DRAPES) IMPLANT
DRSG TEGADERM 2-3/8X2-3/4 SM (GAUZE/BANDAGES/DRESSINGS) IMPLANT
FIBER LASER MOSES 200 DFL (Laser) IMPLANT
GLOVE BIO SURGEON STRL SZ 6.5 (GLOVE) ×1 IMPLANT
GOWN STRL REUS W/ TWL LRG LVL3 (GOWN DISPOSABLE) ×2 IMPLANT
GUIDEWIRE GREEN .038 145CM (MISCELLANEOUS) IMPLANT
GUIDEWIRE STR DUAL SENSOR (WIRE) ×1 IMPLANT
IV NS IRRIG 3000ML ARTHROMATIC (IV SOLUTION) ×1 IMPLANT
KIT TURNOVER CYSTO (KITS) ×1 IMPLANT
PACK CYSTO AR (MISCELLANEOUS) ×1 IMPLANT
SET CYSTO W/LG BORE CLAMP LF (SET/KITS/TRAYS/PACK) ×1 IMPLANT
SHEATH NAVIGATOR HD 12/14X36 (SHEATH) IMPLANT
STENT URET 6FRX24 CONTOUR (STENTS) IMPLANT
STENT URET 6FRX26 CONTOUR (STENTS) IMPLANT
SURGILUBE 2OZ TUBE FLIPTOP (MISCELLANEOUS) ×1 IMPLANT
WATER STERILE IRR 500ML POUR (IV SOLUTION) ×1 IMPLANT

## 2023-01-27 NOTE — Anesthesia Preprocedure Evaluation (Signed)
 Anesthesia Evaluation  Patient identified by MRN, date of birth, ID band Patient awake    Reviewed: Allergy & Precautions, H&P , NPO status , Patient's Chart, lab work & pertinent test results  History of Anesthesia Complications Negative for: history of anesthetic complications  Airway Mallampati: II  TM Distance: >3 FB Neck ROM: full    Dental  (+) Dental Advidsory Given   Pulmonary neg pulmonary ROS, neg sleep apnea (per patient)   Pulmonary exam normal        Cardiovascular Exercise Tolerance: Poor hypertension, (-) angina +CHF (HFpEF)  (-) Past MI and (-) Cardiac Stents Normal cardiovascular exam(-) dysrhythmias (-) Valvular Problems/Murmurs     Neuro/Psych neg Seizures PSYCHIATRIC DISORDERS Anxiety Depression    TIA   GI/Hepatic Neg liver ROS,neg GERD  ,,H/o Duodenal ulcer 05/2018 melena   Endo/Other  negative endocrine ROS    Renal/GU CRFRenal disease  negative genitourinary   Musculoskeletal   Abdominal   Peds  Hematology  (+) Blood dyscrasia, anemia   Anesthesia Other Findings Gait issues due to trauma. Uses walker. Denies DOE.  Past Medical History: No date: Arthritis No date: CHF (congestive heart failure) (HCC)     Comment:  diastolic dysfunction No date: Chronic back pain No date: COVID-19 No date: Depression No date: Diverticulitis 05/2018: Duodenal ulcer     Comment:  Treated with 3 clips/Dr. Abran 2003: Fall off scafolding     Comment:  prolonged hospitalization- multiple BUE surgery,               concussion 05/2018: GI bleed 2016: GIB (gastrointestinal bleeding)     Comment:  Antral ulcer/duodenal bleed per Endoscopy by Dr. Rollin No date: Hypertension No date: PTSD (post-traumatic stress disorder)     Comment:  after  a Fall  No date: Renal insufficiency     Comment:  Decrease in GFR- PCP removed all NSAIDS & aspirin    Past Surgical History: 06/16/2018: BIOPSY     Comment:   Procedure: BIOPSY;  Surgeon: Abran Norleen SAILOR, MD;                Location: [facility] ENDOSCOPY;  Service: Endoscopy;; No date: ELBOW FRACTURE SURGERY     Comment:  ORIF, Shoulder manipulation   04/30/2014: ESOPHAGOGASTRODUODENOSCOPY; N/A     Comment:  Procedure: ESOPHAGOGASTRODUODENOSCOPY (EGD);  Surgeon:               Belvie Rollin, MD;  Location: [facility] ENDOSCOPY;  Service:               Endoscopy;  Laterality: N/A; 06/16/2018: ESOPHAGOGASTRODUODENOSCOPY (EGD) WITH PROPOFOL ; N/A     Comment:  Procedure: ESOPHAGOGASTRODUODENOSCOPY (EGD) WITH               PROPOFOL ;  Surgeon: Abran Norleen SAILOR, MD;  Location: [facility]               ENDOSCOPY;  Service: Endoscopy;  Laterality: N/A; 11/08/2021: EXTRACORPOREAL SHOCK WAVE LITHOTRIPSY; Right     Comment:  Procedure: EXTRACORPOREAL SHOCK WAVE LITHOTRIPSY (ESWL);              Surgeon: Francisca Redell BROCKS, MD;  Location: [facility] ORS;                Service: Urology;  Laterality: Right; 06/16/2018: HEMOSTASIS CLIP PLACEMENT     Comment:  Procedure: HEMOSTASIS CLIP PLACEMENT;  Surgeon: Abran Norleen SAILOR, MD;  Location: [facility] ENDOSCOPY;  Service:               Endoscopy;; 02/16/2015: LUMBAR LAMINECTOMY/DECOMPRESSION MICRODISCECTOMY; N/A     Comment:  Procedure: Thoracic ten - thoracic twelve laminectomy;                Surgeon: Victory Gunnels, MD;  Location: [facility] NEURO ORS;                Service: Neurosurgery;  Laterality: N/A; 03/22/2016: LUMBAR LAMINECTOMY/DECOMPRESSION MICRODISCECTOMY; N/A     Comment:  Procedure: Lumbar One-Two, Lumbar Two-Three, Lumbar               Three-Four Laminectomy and Foraminotomy;  Surgeon: Victory Gunnels, MD;  Location: [facility] OR;  Service: Neurosurgery;                Laterality: N/A; No date: NASAL SEPTUM SURGERY 02/06/2021: ORIF ACETABULAR FRACTURE; Right     Comment:  Procedure: 1. OPEN REDUCTION INTERNAL FIXATION (ORIF)               RIGHT  ACETABULAR FRACTURE USING STOPPA APPROACH 2.               REMOVAL OF TRACTION PIN RIGHT TIBIA;   Surgeon: Celena Sharper, MD;  Location: [facility] OR;  Service: Orthopedics;                Laterality: Right; 02/06/2021: SACRO-ILIAC PINNING; Bilateral     Comment:  Procedure: 3.LEFT AND RIGHT (TRANS) SACROILIAC SCREW               FIXATION;  Surgeon: Celena Sharper, MD;  Location: [facility] OR;              Service: Orthopedics;  Laterality: Bilateral; 06/16/2018: SCLEROTHERAPY     Comment:  Procedure: SCLEROTHERAPY;  Surgeon: Abran Norleen SAILOR, MD;                Location: [facility] ENDOSCOPY;  Service: Endoscopy;; 02/18/2019: VIDEO BRONCHOSCOPY; Bilateral     Comment:  Procedure: VIDEO BRONCHOSCOPY WITHOUT FLUORO;  Surgeon:               Kassie Acquanetta Bradley, MD;  Location: [facility] ENDOSCOPY;  Service:              Pulmonary;  Laterality: Bilateral; 02/06/2021: WOUND EXPLORATION; N/A     Comment:  Procedure: 4. APPLICATION OF  WOUND, SMALL;  Surgeon:               Celena Sharper, MD;  Location: [facility] OR;  Service:               Orthopedics;  Laterality: N/A;  BMI    Body Mass Index: 29.37 kg/m      Reproductive/Obstetrics negative OB ROS                             Anesthesia Physical Anesthesia Plan  ASA: 3  Anesthesia Plan: General   Post-op Pain Management:    Induction: Intravenous  PONV Risk Score and Plan: 2 and Ondansetron , Dexamethasone  and Treatment may vary due to age or medical condition  Airway Management Planned: Oral ETT and LMA  Additional Equipment:   Intra-op Plan:   Post-operative Plan: Extubation in OR  Informed Consent: I have reviewed the patients History and Physical,  chart, labs and discussed the procedure including the risks, benefits and alternatives for the proposed anesthesia with the patient or authorized representative who has indicated his/her understanding and acceptance.     Dental Advisory Given  Plan Discussed with: CRNA and Surgeon  Anesthesia Plan Comments:         Anesthesia Quick Evaluation

## 2023-01-27 NOTE — Anesthesia Postprocedure Evaluation (Signed)
 Anesthesia Post Note  Patient: ONIE HAYASHI  Procedure(s) Performed: CYSTOSCOPY/URETEROSCOPY/HOLMIUM LASER/STENT PLACEMENT (Right: Ureter)  Patient location during evaluation: PACU Anesthesia Type: General Level of consciousness: awake and alert Pain management: pain level controlled Vital Signs Assessment: post-procedure vital signs reviewed and stable Respiratory status: spontaneous breathing, nonlabored ventilation, respiratory function stable and patient connected to nasal cannula oxygen Cardiovascular status: blood pressure returned to baseline and stable Postop Assessment: no apparent nausea or vomiting Anesthetic complications: no   No notable events documented.   Last Vitals:  Vitals:   01/27/23 1214 01/27/23 1256  BP: (P) 123/65 (!) 158/66  Pulse: (P) 68 68  Resp: (P) 18 18  Temp:    SpO2: (P) 94% 92%    Last Pain:  Vitals:   01/27/23 1214  TempSrc:   PainSc: (P) 0-No pain                 Prentice Murphy
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
## 2023-01-27 NOTE — Anesthesia Procedure Notes (Signed)
 Procedure Name: LMA Insertion Date/Time: 01/27/2023 10:18 AM  Performed by: Erie Chock, CRNAPre-anesthesia Checklist: Patient identified, Patient being monitored, Timeout performed, Emergency Drugs available and Suction available Patient Re-evaluated:Patient Re-evaluated prior to induction Oxygen Delivery Method: Circle system utilized Preoxygenation: Pre-oxygenation with 100% oxygen Induction Type: IV induction Ventilation: Mask ventilation without difficulty LMA: LMA inserted LMA Size: 5.0 Tube type: Oral Number of attempts: 2 Placement Confirmation: positive ETCO2 and breath sounds checked- equal and bilateral Tube secured with: Tape Dental Injury: Teeth and Oropharynx as per pre-operative assessment  Comments: Large leak with ambu 5 LMA, switched to igel

## 2023-01-27 NOTE — Discharge Instructions (Signed)
 You have a ureteral stent in place.  This is a tube that extends from your kidney to your bladder.  This may cause urinary bleeding, burning with urination, and urinary frequency.  Please call our office or present to the ED if you develop fevers >101 or pain which is not able to be controlled with oral pain medications.  You may be given either Flomax  and/ or ditropan  to help with bladder spasms and stent pain in addition to pain medications.    Your stent is on a string.  On Thursday morning, you may untape the stent string and then pull gently until the entire stent is removed.  [facility] [address] [place], [place] [address] [phone]

## 2023-01-27 NOTE — Op Note (Signed)
 Date of procedure: 01/27/23  Preoperative diagnosis:  Right nephrolithiasis  Postoperative diagnosis:  Same as above  Procedure: Right ureteroscopy with laser lithotripsy Right trigger pyelogram Right basket extraction of stone fragment Right ureteral stent placement Potation fluoroscopy less than 30 minutes  Surgeon: Rosina Riis, MD  Anesthesia: General  Complications: None  Intraoperative findings: 2 small nonobstructing stones as well as a more dominant 8 mm right lower pole stone treated.  Minimal trauma, stent left on tether.  EBL: Minimal  Specimens: Stone fragment  Drains: 24 French double-J ureteral stent on right with tether  Indication: Kurt [name] is a 73 y.o. patient with residual nonobstructing stone burden.  After reviewing the management options for treatment, he elected to proceed with the above surgical procedure(s). We have discussed the potential benefits and risks of the procedure, side effects of the proposed treatment, the likelihood of the patient achieving the goals of the procedure, and any potential problems that might occur during the procedure or recuperation. Informed consent has been obtained.  Description of procedure:  The patient was taken to the operating room and general anesthesia was induced.  The patient was placed in the dorsal lithotomy position, prepped and draped in the usual sterile fashion, and preoperative antibiotics were administered. A preoperative time-out was performed.   A 24 French cystoscope was advanced per urethra into the bladder.  Attention was turned to the right ureteral orifice.  Notably, the bladder was moderately trabeculated.  On scout imaging, multiple orthopedic pelvic hardware was identified.  The right UO was then cannulated using a 5 French open-ended ureteral catheter.  Retrograde pyelogram showed no filling defects and a decompressed collecting system.  There is some mild tortuosity of the distal ureter but  otherwise normal caliber.  A sensor wire was then placed easily up to the level of the kidney.  A dual-lumen access sheath was then used to introduce a second Super Stiff wire which was used as a working wire.  The safety wire was snapped in place.  A 12/1434 cm ureteral access sheath was advanced to the proximal ureter under fluoroscopic guidance which went easily.  The inner cannula was removed.  A digital flexible dual-lumen ureteroscope was then brought in and the renal pelvis and each of the calyces were directly visualized using the previous retrograde as a roadmap.  2 nonobstructing stones which are relatively small were identified and a 1.9 French tipless nitinol basket was used to remove each of these.  I then used the basket to reposition the extreme lower pole 8 mm stone into an upper pole calyx.  A 200 m laser fiber was then used with settings of 0.3 J and 80 Hz to completely dust the stone.  Once no significant residual fragments remained, the remainder of the collecting system was then reevaluated and no residual stone burden was identified.  Scope was then backed down the length of the ureter inspecting on the way.  There is no ureteral trauma or injury noted along with no additional fragments.  The safety wire was backloaded over rigid cystoscope and a 6 x 24 French double-J ureteral stent was placed over the wire up to the level of the kidney.  Once the wire was withdrawn, a full coil was noted both within the renal pelvis as well as within the bladder.  The bladder was then drained.  The stent string was left attached to the distal coil of the stent which was attached to the patient's glans using Mastisol  and Tegaderm.  He was then cleaned and dried, repositioned in the supine position, reversed from anesthesia, taken to the PACU in stable condition.  Plan: He may remove his own stent on Thursday.  Will have him follow-up in 4 to 6 weeks with Samantha Vaillancourt with a renal  ultrasound.  Rosina Riis, M.D.

## 2023-01-27 NOTE — Transfer of Care (Signed)
 Immediate Anesthesia Transfer of Care Note  Patient: Kurt [name]  Procedure(s) Performed: CYSTOSCOPY/URETEROSCOPY/HOLMIUM LASER/STENT PLACEMENT (Right: Ureter)  Patient Location: PACU  Anesthesia Type:General  Level of Consciousness: awake, alert , and oriented  Airway & Oxygen Therapy: Patient Spontanous Breathing  Post-op Assessment: Report given to RN and Post -op Vital signs reviewed and stable  Post vital signs: Reviewed and stable  Last Vitals:  Vitals Value Taken Time  BP 132/72 01/27/23 1103  Temp 36.8 C 01/27/23 1103  Pulse 63 01/27/23 1104  Resp 6 01/27/23 1104  SpO2 99 % 01/27/23 1104  Vitals shown include unfiled device data.  Last Pain:  Vitals:   01/27/23 0842  TempSrc: Temporal         Complications: No notable events documented.

## 2023-01-28 ENCOUNTER — Encounter: Payer: Self-pay | Admitting: Urology

## 2023-01-30 LAB — CALCULI, WITH PHOTOGRAPH (CLINICAL LAB)
Calcium Oxalate Dihydrate: 10 %
Calcium Oxalate Monohydrate: 90 %
Weight Calculi: 4 mg

## 2023-02-28 ENCOUNTER — Ambulatory Visit
Admission: RE | Admit: 2023-02-28 | Discharge: 2023-02-28 | Disposition: A | Discharge status: Home / Self Care | Payer: Medicare (Managed Care) | Source: Ambulatory Visit | Attending: Urology | Admitting: Urology

## 2023-02-28 DIAGNOSIS — N200 Calculus of kidney: Secondary | ICD-10-CM | POA: Insufficient documentation

## 2023-03-06 ENCOUNTER — Encounter: Payer: Self-pay | Admitting: Physician Assistant

## 2023-03-06 ENCOUNTER — Ambulatory Visit: Payer: Medicare (Managed Care) | Admitting: Physician Assistant

## 2023-03-06 VITALS — BP 125/77 | HR 62 | Ht 71.0 in | Wt 231.0 lb

## 2023-03-06 DIAGNOSIS — N200 Calculus of kidney: Secondary | ICD-10-CM

## 2023-03-06 DIAGNOSIS — Z09 Encounter for follow-up examination after completed treatment for conditions other than malignant neoplasm: Secondary | ICD-10-CM | POA: Diagnosis not present

## 2023-03-06 DIAGNOSIS — Z87442 Personal history of urinary calculi: Secondary | ICD-10-CM

## 2023-03-06 NOTE — Progress Notes (Signed)
03/06/2023 2:09 PM   Luna Glasgow 11/09/1950 [id number]  CC: Chief Complaint  Patient presents with   Follow-up   HPI: Kurt [name] is a 73 y.o. male with PMH nephrolithiasis s/p right ureteroscopy with Dr. Apolinar Junes on 01/27/2023 who presents today for postop follow-up.   Today he reports gross hematuria immediately postop which cleared after stent removal.  He has no acute concerns today.  Renal ultrasound dated 02/28/2023 with no hydronephrosis or shadowing stones.  There is irregularity of the bladder wall consistent with trabeculation seen intraoperatively.  He admits to poor p.o. hydration but has been drinking more flavored water and substituting it for sodas.   PMH: Past Medical History:  Diagnosis Date   Acute kidney injury superimposed on CKD (HCC) 04/29/2014   Acute on chronic respiratory failure (HCC) 01/18/2019   Acute renal failure superimposed on stage 3a chronic kidney disease (HCC) 01/20/2019   Acute respiratory failure with hypoxia (HCC) 12/27/2018   Acute upper GI bleed 03/02/2022   Ankle fracture, left, closed, initial encounter    Arthritis    Bilateral carpal tunnel syndrome    Cavitary pneumonia 02/17/2019   CHF (congestive heart failure) (HCC)    diastolic dysfunction   Chronic back pain    Chronic sinusitis    CKD (chronic kidney disease) stage 3, GFR 30-59 ml/min (HCC)    COVID-19    Depression    Diverticulitis    Duodenal ulcer 05/2018   Treated with 3 clips/Dr. Marina Goodell   Fall off scafolding 2003   prolonged hospitalization- multiple BUE surgery, concussion   GI bleed 05/2018   GIB (gastrointestinal bleeding) 2016   Antral ulcer/duodenal bleed per Endoscopy by Dr. 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, left    On home oxygen therapy    OSA (obstructive sleep apnea)    Pelvic fracture (HCC)    PTSD (post-traumatic stress disorder)    after  a Fall    Pulmonary nodule    QT  prolongation    Renal insufficiency    Decrease in GFR- PCP removed all NSAIDS & aspirin    Spinal stenosis, thoracic    TIA (transient ischemic attack)    Ulnar neuropathy at elbow     Surgical History: Past Surgical History:  Procedure Laterality Date   BIOPSY  06/16/2018   Procedure: BIOPSY;  Surgeon: Hilarie Fredrickson, MD;  Location: [facility] ENDOSCOPY;  Service: Endoscopy;;   CYSTOSCOPY/URETEROSCOPY/HOLMIUM LASER/STENT PLACEMENT Right 01/27/2023   Procedure: CYSTOSCOPY/URETEROSCOPY/HOLMIUM LASER/STENT PLACEMENT;  Surgeon: Vanna Scotland, MD;  Location: [facility] ORS;  Service: Urology;  Laterality: Right;   ELBOW FRACTURE SURGERY     ORIF, Shoulder manipulation     ESOPHAGOGASTRODUODENOSCOPY N/A 04/30/2014   Procedure: ESOPHAGOGASTRODUODENOSCOPY (EGD);  Surgeon: Jeani Hawking, MD;  Location: [facility] ENDOSCOPY;  Service: Endoscopy;  Laterality: N/A;   ESOPHAGOGASTRODUODENOSCOPY (EGD) WITH PROPOFOL N/A 06/16/2018   Procedure: ESOPHAGOGASTRODUODENOSCOPY (EGD) WITH PROPOFOL;  Surgeon: Hilarie Fredrickson, MD;  Location: [facility] ENDOSCOPY;  Service: Endoscopy;  Laterality: N/A;   ESOPHAGOGASTRODUODENOSCOPY (EGD) WITH PROPOFOL N/A 03/04/2022   Procedure: ESOPHAGOGASTRODUODENOSCOPY (EGD) WITH PROPOFOL;  Surgeon: Toney Reil, MD;  Location: [facility] ENDOSCOPY;  Service: Gastroenterology;  Laterality: N/A;   EXTRACORPOREAL SHOCK WAVE LITHOTRIPSY Right 11/08/2021   Procedure: EXTRACORPOREAL SHOCK WAVE LITHOTRIPSY (ESWL);  Surgeon: Sondra Come, MD;  Location: [facility] ORS;  Service: Urology;  Laterality: Right;   HEMOSTASIS CLIP PLACEMENT  06/16/2018   Procedure: HEMOSTASIS  CLIP PLACEMENT;  Surgeon: Hilarie Fredrickson, MD;  Location: [facility] ENDOSCOPY;  Service: Endoscopy;;   LUMBAR LAMINECTOMY/DECOMPRESSION MICRODISCECTOMY N/A 02/16/2015   Procedure: Thoracic ten - thoracic twelve laminectomy;  Surgeon: Julio Sicks, MD;  Location: [facility] NEURO ORS;  Service: Neurosurgery;  Laterality: N/A;   LUMBAR LAMINECTOMY/DECOMPRESSION MICRODISCECTOMY  N/A 03/22/2016   Procedure: Lumbar One-Two, Lumbar Two-Three, Lumbar Three-Four Laminectomy and Foraminotomy;  Surgeon: Julio Sicks, MD;  Location: [facility] OR;  Service: Neurosurgery;  Laterality: N/A;   NASAL SEPTUM SURGERY     ORIF ACETABULAR FRACTURE Right 02/06/2021   Procedure: 1. OPEN REDUCTION INTERNAL FIXATION (ORIF) RIGHT  ACETABULAR FRACTURE USING STOPPA APPROACH 2. REMOVAL OF TRACTION PIN RIGHT TIBIA;  Surgeon: Myrene Galas, MD;  Location: [facility] OR;  Service: Orthopedics;  Laterality: Right;   SACRO-ILIAC PINNING Bilateral 02/06/2021   Procedure: 3.LEFT AND RIGHT (TRANS) SACROILIAC SCREW FIXATION;  Surgeon: Myrene Galas, MD;  Location: [facility] OR;  Service: Orthopedics;  Laterality: Bilateral;   SCLEROTHERAPY  06/16/2018   Procedure: SCLEROTHERAPY;  Surgeon: Hilarie Fredrickson, MD;  Location: [facility] ENDOSCOPY;  Service: Endoscopy;;   VIDEO BRONCHOSCOPY Bilateral 02/18/2019   Procedure: VIDEO BRONCHOSCOPY WITHOUT FLUORO;  Surgeon: Luciano Cutter, MD;  Location[facility] ENDOSCOPY;  Service: Pulmonary;  Laterality: Bilateral;   WOUND EXPLORATION N/A 02/06/2021   Procedure: 4. APPLICATION OF  WOUND, SMALL;  Surgeon: Myrene Galas, MD;  Location: [facility] OR;  Service: Orthopedics;  Laterality: N/A;    Home Medications:  Allergies as of 03/06/2023       Reactions   Lyrica [pregabalin] Other (See Comments)   Falls and balance problems        Medication List        Accurate as of March 06, 2023  2:09 PM. If you have any questions, ask your nurse or doctor.          acetaminophen 650 MG CR tablet Commonly known as: TYLENOL Take 1,300 mg by mouth 2 (two) times daily.   allopurinol 100 MG tablet Commonly known as: ZYLOPRIM Take 100 mg by mouth 2 (two) times a day.   amLODipine 2.5 MG tablet Commonly known as: NORVASC Take 2.5 mg by mouth daily.   atenolol 25 MG tablet Commonly known as: TENORMIN Take 25 mg by mouth daily.   atorvastatin 40 MG tablet Commonly known as: LIPITOR Take 40 mg by  mouth daily.   busPIRone 15 MG tablet Commonly known as: BUSPAR Take 15 mg by mouth daily.   carvedilol 3.125 MG tablet Commonly known as: COREG Take 1 tablet (3.125 mg total) by mouth 2 (two) times daily with a meal.   ferrous sulfate 324 (65 Fe) MG Tbec Take by mouth.   fluocinonide cream 0.05 % Commonly known as: LIDEX Apply topically.   fluticasone 50 MCG/ACT nasal spray Commonly known as: FLONASE Place 1 spray into both nostrils daily as needed for allergies.   furosemide 80 MG tablet Commonly known as: LASIX Take 80 mg by mouth daily.   HYDROcodone-acetaminophen 5-325 MG tablet Commonly known as: NORCO/VICODIN Take 1-2 tablets by mouth every 6 (six) hours as needed for moderate pain (pain score 4-6).   lidocaine 5 % Commonly known as: LIDODERM Place 1 patch onto the skin daily.   Lyrica 25 MG capsule Generic drug: pregabalin Take 25 mg by mouth 2 (two) times daily.   meclizine 25 MG tablet Commonly known as: ANTIVERT Take 25 mg by mouth 2 (two) times daily as needed.   melatonin 3 MG Tabs tablet Take  6 mg by mouth at bedtime.   multivitamin with minerals Tabs tablet Take 1 tablet by mouth daily.   omeprazole 20 MG capsule Commonly known as: PRILOSEC Take 20 mg by mouth 2 (two) times daily.   oxybutynin 5 MG tablet Commonly known as: DITROPAN Take 1 tablet (5 mg total) by mouth every 8 (eight) hours as needed for bladder spasms.   polyethylene glycol 17 g packet Commonly known as: MIRALAX / GLYCOLAX Take 17 g by mouth daily as needed for mild constipation.   sertraline 100 MG tablet Commonly known as: ZOLOFT Take 2 tablets (200 mg total) by mouth every morning.   tamsulosin 0.4 MG Caps capsule Commonly known as: FLOMAX Take 1 capsule (0.4 mg total) by mouth daily after supper.   traZODone 50 MG tablet Commonly known as: DESYREL Take 50 mg by mouth at bedtime.   vitamin B-12 500 MCG tablet Commonly known as: CYANOCOBALAMIN Take 500 mcg by  mouth daily.        Allergies:  Allergies  Allergen Reactions   Lyrica [Pregabalin] Other (See Comments)    Falls and balance problems    Family History: No family history on file.  Social History:   reports that he has never smoked. He has never used smokeless tobacco. He reports that he does not currently use alcohol. He reports that he does not use drugs.  Physical Exam: BP 125/77   Pulse 62   Ht 5\' 11"  (1.803 m)   Wt 231 lb (104.8 kg)   BMI 32.22 kg/m   Constitutional:  Alert and oriented, no acute distress, nontoxic appearing HEENT: [place], AT Cardiovascular: No clubbing, cyanosis, or edema Respiratory: Normal respiratory effort, no increased work of breathing Skin: No rashes, bruises or suspicious lesions Neurologic: Grossly intact, no focal deficits, moving all 4 extremities Psychiatric: Normal mood and affect  Pertinent Imaging: Results for orders placed during the hospital encounter of 02/28/23  Ultrasound renal complete  Narrative CLINICAL DATA:  Nephrolithiasis  EXAM: RENAL / URINARY TRACT ULTRASOUND COMPLETE  COMPARISON:  Renal stone CT 02/10/2022  FINDINGS: Right Kidney:  Renal measurements: 11.0 x 4.9 x 5.0 cm = volume: 140 mL. Normal renal cortical thickness and echogenicity. Mild pelviectasis. There is a 1.5 cm cyst inferior pole.  Left Kidney:  Renal measurements: 10.7 x 5.0 x 4.7 cm = volume: 138.5 mL. Normal renal cortical thickness and echogenicity. No mass. Mild pelviectasis.  Bladder:  Mild irregularity of the urinary bladder wall, nonspecific.  Other:  None.  IMPRESSION: 1. Mild bilateral pelviectasis. 2. Mild irregularity of the urinary bladder wall, nonspecific.   Electronically Signed By: Annia Belt M.D. On: 02/28/2023 10:41  I personally reviewed the images referenced above and note hydronephrosis or shadowing stones.  Assessment & Plan:   1. Right nephrolithiasis (Primary) He tolerated ureteroscopy well and  stent was removed as instructed.  No hydronephrosis on renal ultrasound.  Will see him back next year with renal ultrasound prior.  We discussed dehydration as a leading cause of kidney stones and I encouraged him to stay well-hydrated. - US RENAL; Future   Return in about 1 year (around 03/05/2024) for Annual stone visit with [facility]US prior.  Carman Ching, PA-C  [facility] [place] [address], [address] [place], [place] [address] [phone]

## 2023-06-04 ENCOUNTER — Other Ambulatory Visit: Payer: Self-pay | Admitting: Family Medicine

## 2023-06-04 DIAGNOSIS — N200 Calculus of kidney: Secondary | ICD-10-CM

## 2023-06-18 ENCOUNTER — Ambulatory Visit
Admission: RE | Admit: 2023-06-18 | Discharge: 2023-06-18 | Disposition: A | Discharge status: Home / Self Care | Payer: Medicare (Managed Care) | Source: Ambulatory Visit | Attending: Family Medicine | Admitting: Family Medicine

## 2023-06-18 DIAGNOSIS — N200 Calculus of kidney: Secondary | ICD-10-CM | POA: Insufficient documentation

## 2023-07-21 ENCOUNTER — Other Ambulatory Visit: Payer: Self-pay | Admitting: Physician Assistant

## 2023-07-21 DIAGNOSIS — Z87442 Personal history of urinary calculi: Secondary | ICD-10-CM

## 2023-07-22 ENCOUNTER — Ambulatory Visit
Admission: RE | Admit: 2023-07-22 | Discharge: 2023-07-22 | Disposition: A | Discharge status: Home / Self Care | Source: Ambulatory Visit | Attending: Physician Assistant | Admitting: Physician Assistant

## 2023-07-22 ENCOUNTER — Ambulatory Visit (INDEPENDENT_AMBULATORY_CARE_PROVIDER_SITE_OTHER): Payer: Medicare (Managed Care) | Admitting: Physician Assistant

## 2023-07-22 VITALS — BP 114/69 | HR 67 | Ht 71.0 in | Wt 220.0 lb

## 2023-07-22 DIAGNOSIS — R109 Unspecified abdominal pain: Secondary | ICD-10-CM | POA: Diagnosis not present

## 2023-07-22 DIAGNOSIS — Z87442 Personal history of urinary calculi: Secondary | ICD-10-CM

## 2023-07-22 LAB — URINALYSIS, COMPLETE
Bilirubin, UA: NEGATIVE
Glucose, UA: NEGATIVE
Ketones, UA: NEGATIVE
Leukocytes,UA: NEGATIVE
Nitrite, UA: NEGATIVE
Protein,UA: NEGATIVE
RBC, UA: NEGATIVE
Specific Gravity, UA: 1.01 (ref 1.005–1.030)
Urobilinogen, Ur: 0.2 mg/dL (ref 0.2–1.0)
pH, UA: 7 (ref 5.0–7.5)

## 2023-07-22 LAB — MICROSCOPIC EXAMINATION

## 2023-07-22 NOTE — Progress Notes (Unsigned)
 07/22/2023 12:46 PM   Kurt [name] October 13, 1950 [id number]  CC: Chief Complaint  Patient presents with   Nephrolithiasis   HPI: Kurt [name] is a 73 y.o. male with PMH nephrolithiasis s/p right ureteroscopy with Dr. Penne on 01/27/2023 who presents today for evaluation of possible acute stone episode.   Postop renal ultrasound dated 02/28/2023 showed mild bilateral pelviectasis without shadowing stones.  Repeat renal ultrasound on 06/18/2023 showed no hydronephrosis and bilateral nonobstructing renal stones measuring up to 9.6 mm on the right.  Today he reports intermittent right flank burning that is largely positional and improved with movement.  He has a history of chronic back pain, though states this pain feels different.  He denies fever, chills, nausea, vomiting, or gross hematuria.  He is frustrated at the rate of stone recurrence.  He has been drinking 1 gallon of Crystal light lemonade daily stone prevention.  KUB today with no radiopaque urolithiasis.  In-office UA today with pH 7.0; urine microscopy with 6-10 WBCs/HPF.  PMH: Past Medical History:  Diagnosis Date   Acute kidney injury superimposed on CKD (HCC) 04/29/2014   Acute on chronic respiratory failure (HCC) 01/18/2019   Acute renal failure superimposed on stage 3a chronic kidney disease (HCC) 01/20/2019   Acute respiratory failure with hypoxia (HCC) 12/27/2018   Acute upper GI bleed 03/02/2022   Ankle fracture, left, closed, initial encounter    Arthritis    Bilateral carpal tunnel syndrome    Cavitary pneumonia 02/17/2019   CHF (congestive heart failure) (HCC)    diastolic dysfunction   Chronic back pain    Chronic sinusitis    CKD (chronic kidney disease) stage 3, GFR 30-59 ml/min (HCC)    COVID-19    Depression    Diverticulitis    Duodenal ulcer 05/2018   Treated with 3 clips/Dr. Abran   Fall off scafolding 2003   prolonged hospitalization- multiple BUE surgery, concussion   GI bleed 05/2018    GIB (gastrointestinal bleeding) 2016   Antral ulcer/duodenal bleed per Endoscopy by Dr. 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, left    On home oxygen therapy    OSA (obstructive sleep apnea)    Pelvic fracture (HCC)    PTSD (post-traumatic stress disorder)    after  a Fall    Pulmonary nodule    QT prolongation    Renal insufficiency    Decrease in GFR- PCP removed all NSAIDS & aspirin     Spinal stenosis, thoracic    TIA (transient ischemic attack)    Ulnar neuropathy at elbow     Surgical History: Past Surgical History:  Procedure Laterality Date   BIOPSY  06/16/2018   Procedure: BIOPSY;  Surgeon: Abran Norleen LOISE, MD;  Location: [facility] ENDOSCOPY;  Service: Endoscopy;;   CYSTOSCOPY/URETEROSCOPY/HOLMIUM LASER/STENT PLACEMENT Right 01/27/2023   Procedure: CYSTOSCOPY/URETEROSCOPY/HOLMIUM LASER/STENT PLACEMENT;  Surgeon: Penne Knee, MD;  Location: [facility] ORS;  Service: Urology;  Laterality: Right;   ELBOW FRACTURE SURGERY     ORIF, Shoulder manipulation     ESOPHAGOGASTRODUODENOSCOPY N/A 04/30/2014   Procedure: ESOPHAGOGASTRODUODENOSCOPY (EGD);  Surgeon: Belvie Rollin, MD;  Location: [facility] ENDOSCOPY;  Service: Endoscopy;  Laterality: N/A;   ESOPHAGOGASTRODUODENOSCOPY (EGD) WITH PROPOFOL  N/A 06/16/2018   Procedure: ESOPHAGOGASTRODUODENOSCOPY (EGD) WITH PROPOFOL ;  Surgeon: Abran Norleen LOISE, MD;  Location: [facility] ENDOSCOPY;  Service: Endoscopy;  Laterality: N/A;   ESOPHAGOGASTRODUODENOSCOPY (EGD) WITH PROPOFOL  N/A 03/04/2022   Procedure: ESOPHAGOGASTRODUODENOSCOPY (EGD) WITH PROPOFOL ;  Surgeon: Unk Corinn Skiff, MD;  Location: [facility] ENDOSCOPY;  Service: Gastroenterology;  Laterality: N/A;   EXTRACORPOREAL SHOCK WAVE LITHOTRIPSY Right 11/08/2021   Procedure: EXTRACORPOREAL SHOCK WAVE LITHOTRIPSY (ESWL);  Surgeon: Francisca Redell BROCKS, MD;  Location: [facility] ORS;  Service: Urology;  Laterality: Right;   HEMOSTASIS CLIP PLACEMENT  06/16/2018   Procedure:  HEMOSTASIS CLIP PLACEMENT;  Surgeon: Abran Norleen SAILOR, MD;  Location: [facility] ENDOSCOPY;  Service: Endoscopy;;   LUMBAR LAMINECTOMY/DECOMPRESSION MICRODISCECTOMY N/A 02/16/2015   Procedure: Thoracic ten - thoracic twelve laminectomy;  Surgeon: Victory Gunnels, MD;  Location: [facility] NEURO ORS;  Service: Neurosurgery;  Laterality: N/A;   LUMBAR LAMINECTOMY/DECOMPRESSION MICRODISCECTOMY N/A 03/22/2016   Procedure: Lumbar One-Two, Lumbar Two-Three, Lumbar Three-Four Laminectomy and Foraminotomy;  Surgeon: Victory Gunnels, MD;  Location: [facility] OR;  Service: Neurosurgery;  Laterality: N/A;   NASAL SEPTUM SURGERY     ORIF ACETABULAR FRACTURE Right 02/06/2021   Procedure: 1. OPEN REDUCTION INTERNAL FIXATION (ORIF) RIGHT  ACETABULAR FRACTURE USING STOPPA APPROACH 2. REMOVAL OF TRACTION PIN RIGHT TIBIA;  Surgeon: Celena Sharper, MD;  Location: [facility] OR;  Service: Orthopedics;  Laterality: Right;   SACRO-ILIAC PINNING Bilateral 02/06/2021   Procedure: 3.LEFT AND RIGHT (TRANS) SACROILIAC SCREW FIXATION;  Surgeon: Celena Sharper, MD;  Location: [facility] OR;  Service: Orthopedics;  Laterality: Bilateral;   SCLEROTHERAPY  06/16/2018   Procedure: SCLEROTHERAPY;  Surgeon: Abran Norleen SAILOR, MD;  Location: [facility] ENDOSCOPY;  Service: Endoscopy;;   VIDEO BRONCHOSCOPY Bilateral 02/18/2019   Procedure: VIDEO BRONCHOSCOPY WITHOUT FLUORO;  Surgeon: Kassie Acquanetta Bradley, MD;  Location: [facility] ENDOSCOPY;  Service: Pulmonary;  Laterality: Bilateral;   WOUND EXPLORATION N/A 02/06/2021   Procedure: 4. APPLICATION OF  WOUND, SMALL;  Surgeon: Celena Sharper, MD;  Location: [facility] OR;  Service: Orthopedics;  Laterality: N/A;    Home Medications:  Allergies as of 07/22/2023       Reactions   Lyrica  [pregabalin ] Other (See Comments)   Falls and balance problems        Medication List        Accurate as of July 22, 2023 12:46 PM. If you have any questions, ask your nurse or doctor.          acetaminophen  650 MG CR tablet Commonly known as: TYLENOL  Take 1,300 mg by mouth 2  (two) times daily.   allopurinol  100 MG tablet Commonly known as: ZYLOPRIM  Take 100 mg by mouth 2 (two) times a day.   amLODipine  2.5 MG tablet Commonly known as: NORVASC  Take 2.5 mg by mouth daily.   atenolol 25 MG tablet Commonly known as: TENORMIN Take 25 mg by mouth daily.   atorvastatin  40 MG tablet Commonly known as: LIPITOR Take 40 mg by mouth daily.   busPIRone  15 MG tablet Commonly known as: BUSPAR  Take 15 mg by mouth daily.   carvedilol  3.125 MG tablet Commonly known as: COREG  Take 1 tablet (3.125 mg total) by mouth 2 (two) times daily with a meal.   ferrous sulfate  324 (65 Fe) MG Tbec Take by mouth.   fluocinonide cream 0.05 % Commonly known as: LIDEX Apply topically.   fluticasone  50 MCG/ACT nasal spray Commonly known as: FLONASE  Place 1 spray into both nostrils daily as needed for allergies.   furosemide  80 MG tablet Commonly known as: LASIX  Take 80 mg by mouth daily.   HYDROcodone -acetaminophen  5-325 MG tablet Commonly known as: NORCO/VICODIN Take 1-2 tablets by mouth every 6 (six) hours as needed for moderate pain (pain score 4-6).   lidocaine  5 % Commonly  known as: LIDODERM  Place 1 patch onto the skin daily.   Lyrica  25 MG capsule Generic drug: pregabalin  Take 25 mg by mouth 2 (two) times daily.   meclizine 25 MG tablet Commonly known as: ANTIVERT Take 25 mg by mouth 2 (two) times daily as needed.   melatonin 3 MG Tabs tablet Take 6 mg by mouth at bedtime.   multivitamin with minerals Tabs tablet Take 1 tablet by mouth daily.   omeprazole  20 MG capsule Commonly known as: PRILOSEC Take 20 mg by mouth 2 (two) times daily.   oxybutynin  5 MG tablet Commonly known as: DITROPAN  Take 1 tablet (5 mg total) by mouth every 8 (eight) hours as needed for bladder spasms.   polyethylene glycol 17 g packet Commonly known as: MIRALAX  / GLYCOLAX  Take 17 g by mouth daily as needed for mild constipation.   sertraline  100 MG tablet Commonly  known as: ZOLOFT  Take 2 tablets (200 mg total) by mouth every morning.   tamsulosin  0.4 MG Caps capsule Commonly known as: FLOMAX  Take 1 capsule (0.4 mg total) by mouth daily after supper.   traZODone  50 MG tablet Commonly known as: DESYREL  Take 50 mg by mouth at bedtime.   vitamin B-12 500 MCG tablet Commonly known as: CYANOCOBALAMIN  Take 500 mcg by mouth daily.        Allergies:  Allergies  Allergen Reactions   Lyrica  [Pregabalin ] Other (See Comments)    Falls and balance problems    Family History: No family history on file.  Social History:   reports that he has never smoked. He has never used smokeless tobacco. He reports that he does not currently use alcohol . He reports that he does not use drugs.  Physical Exam: BP 114/69   Pulse 67   Ht 5' 11 (1.803 m)   Wt 220 lb (99.8 kg)   BMI 30.68 kg/m   Constitutional:  Alert and oriented, no acute distress, nontoxic appearing HEENT: [place], AT Cardiovascular: No clubbing, cyanosis, or edema Respiratory: Normal respiratory effort, no increased work of breathing Skin: No rashes, bruises or suspicious lesions Neurologic: Grossly intact, no focal deficits, moving all 4 extremities Psychiatric: Normal mood and affect  Laboratory Data: Results for orders placed or performed in visit on 07/22/23  Microscopic Examination   Collection Time: 07/22/23 11:35 AM   Urine  Result Value Ref Range   WBC, UA 6-10 (A) 0 - 5 /hpf   RBC, Urine 0-2 0 - 2 /hpf   Epithelial Cells (non renal) 0-10 0 - 10 /hpf   Bacteria, UA Few None seen/Few  Urinalysis, Complete   Collection Time: 07/22/23 11:35 AM  Result Value Ref Range   Specific Gravity, UA 1.010 1.005 - 1.030   pH, UA 7.0 5.0 - 7.5   Color, UA Yellow Yellow   Appearance Ur Clear Clear   Leukocytes,UA Negative Negative   Protein,UA Negative Negative/Trace   Glucose, UA Negative Negative   Ketones, UA Negative Negative   RBC, UA Negative Negative   Bilirubin, UA Negative  Negative   Urobilinogen, Ur 0.2 0.2 - 1.0 mg/dL   Nitrite, UA Negative Negative   Microscopic Examination Comment    Microscopic Examination See below:     Pertinent Imaging: KUB, 07/22/2023: See epic  I personally reviewed the images referenced above and note no radiopaque urolithiasis.  Assessment & Plan:   1. History of nephrolithiasis (Primary) His intermittent flank pain seems more likely musculoskeletal in origin, however there is evidence on recent renal ultrasound  of bilateral stone regrowth.  I would like to get a CT stone study for further evaluation of this, as even with an approximate 30% stone size exaggeration rate on ultrasound, I think the stones are rather sizable given the short interval since his most recent renal ultrasound which showed no stones.  I called [facility] to request approval. Will call him with results. - Urinalysis, Complete   Return for Will call with results.  Lucie Hones, PA-C  [facility] [place] [address], [address] [place], [place] [address] [phone]

## 2023-07-28 ENCOUNTER — Other Ambulatory Visit: Payer: Self-pay | Admitting: Family Medicine

## 2023-07-28 DIAGNOSIS — N200 Calculus of kidney: Secondary | ICD-10-CM

## 2023-08-11 ENCOUNTER — Ambulatory Visit
Admission: RE | Admit: 2023-08-11 | Discharge: 2023-08-11 | Disposition: A | Discharge status: Home / Self Care | Source: Ambulatory Visit | Attending: Family Medicine | Admitting: Family Medicine

## 2023-08-11 DIAGNOSIS — N200 Calculus of kidney: Secondary | ICD-10-CM | POA: Insufficient documentation

## 2024-01-30 ENCOUNTER — Other Ambulatory Visit: Payer: Self-pay | Admitting: Family Medicine

## 2024-01-30 ENCOUNTER — Ambulatory Visit
Admission: RE | Admit: 2024-01-30 | Discharge: 2024-01-30 | Disposition: A | Source: Ambulatory Visit | Attending: Family Medicine

## 2024-01-30 DIAGNOSIS — I951 Orthostatic hypotension: Secondary | ICD-10-CM

## 2024-01-30 DIAGNOSIS — W19XXXD Unspecified fall, subsequent encounter: Secondary | ICD-10-CM | POA: Diagnosis not present

## 2024-01-30 DIAGNOSIS — J329 Chronic sinusitis, unspecified: Secondary | ICD-10-CM | POA: Diagnosis not present

## 2024-01-30 DIAGNOSIS — S0990XA Unspecified injury of head, initial encounter: Secondary | ICD-10-CM | POA: Insufficient documentation

## 2024-03-05 ENCOUNTER — Ambulatory Visit: Payer: Medicare (Managed Care) | Admitting: Physician Assistant
